# Patient Record
Sex: Male | Born: 1940
Health system: Southern US, Community
[De-identification: ages and names within clinical notes are randomized; demographics above are authoritative.]

## PROBLEM LIST (undated history)

## (undated) DIAGNOSIS — I5042 Chronic combined systolic (congestive) and diastolic (congestive) heart failure: Secondary | ICD-10-CM

## (undated) DIAGNOSIS — Z9889 Other specified postprocedural states: Secondary | ICD-10-CM

## (undated) DIAGNOSIS — I471 Supraventricular tachycardia, unspecified: Secondary | ICD-10-CM

## (undated) DIAGNOSIS — I493 Ventricular premature depolarization: Secondary | ICD-10-CM

## (undated) DIAGNOSIS — I428 Other cardiomyopathies: Secondary | ICD-10-CM

## (undated) DIAGNOSIS — I739 Peripheral vascular disease, unspecified: Secondary | ICD-10-CM

## (undated) DIAGNOSIS — C801 Malignant (primary) neoplasm, unspecified: Secondary | ICD-10-CM

## (undated) DIAGNOSIS — I2510 Atherosclerotic heart disease of native coronary artery without angina pectoris: Secondary | ICD-10-CM

## (undated) DIAGNOSIS — N529 Male erectile dysfunction, unspecified: Secondary | ICD-10-CM

## (undated) DIAGNOSIS — Z87442 Personal history of urinary calculi: Secondary | ICD-10-CM

## (undated) DIAGNOSIS — T8859XA Other complications of anesthesia, initial encounter: Secondary | ICD-10-CM

## (undated) DIAGNOSIS — I10 Essential (primary) hypertension: Secondary | ICD-10-CM

## (undated) DIAGNOSIS — J310 Chronic rhinitis: Secondary | ICD-10-CM

## (undated) DIAGNOSIS — M1990 Unspecified osteoarthritis, unspecified site: Secondary | ICD-10-CM

## (undated) DIAGNOSIS — H269 Unspecified cataract: Secondary | ICD-10-CM

## (undated) DIAGNOSIS — Z89511 Acquired absence of right leg below knee: Secondary | ICD-10-CM

## (undated) DIAGNOSIS — R112 Nausea with vomiting, unspecified: Secondary | ICD-10-CM

## (undated) DIAGNOSIS — I00 Rheumatic fever without heart involvement: Secondary | ICD-10-CM

## (undated) DIAGNOSIS — T4145XA Adverse effect of unspecified anesthetic, initial encounter: Secondary | ICD-10-CM

## (undated) HISTORY — DX: Chronic rhinitis: J31.0

## (undated) HISTORY — PX: EYE SURGERY: SHX253

## (undated) HISTORY — DX: Rheumatic fever without heart involvement: I00

## (undated) HISTORY — DX: Unspecified osteoarthritis, unspecified site: M19.90

## (undated) HISTORY — DX: Atherosclerotic heart disease of native coronary artery without angina pectoris: I25.10

## (undated) HISTORY — DX: Male erectile dysfunction, unspecified: N52.9

## (undated) HISTORY — PX: OTHER SURGICAL HISTORY: SHX169

## (undated) HISTORY — DX: Essential (primary) hypertension: I10

---
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
## 1981-05-28 HISTORY — PX: CHOLECYSTECTOMY: SHX55

## 1999-09-04 ENCOUNTER — Other Ambulatory Visit: Admission: RE | Admit: 1999-09-04 | Discharge: 1999-09-04 | Payer: Self-pay | Admitting: Gastroenterology

## 2004-08-25 ENCOUNTER — Ambulatory Visit: Payer: Self-pay | Admitting: Family Medicine

## 2004-09-15 ENCOUNTER — Ambulatory Visit: Payer: Self-pay | Admitting: Family Medicine

## 2004-09-19 ENCOUNTER — Encounter: Admission: RE | Admit: 2004-09-19 | Discharge: 2004-09-19 | Payer: Self-pay | Admitting: Family Medicine

## 2004-09-19 ENCOUNTER — Ambulatory Visit: Payer: Self-pay | Admitting: Family Medicine

## 2004-09-22 ENCOUNTER — Ambulatory Visit: Payer: Self-pay | Admitting: Gastroenterology

## 2004-10-30 ENCOUNTER — Encounter (INDEPENDENT_AMBULATORY_CARE_PROVIDER_SITE_OTHER): Payer: Self-pay | Admitting: *Deleted

## 2004-10-30 ENCOUNTER — Ambulatory Visit: Payer: Self-pay | Admitting: Gastroenterology

## 2005-09-07 ENCOUNTER — Ambulatory Visit: Payer: Self-pay | Admitting: Family Medicine

## 2005-09-18 ENCOUNTER — Ambulatory Visit: Payer: Self-pay | Admitting: Family Medicine

## 2005-11-15 ENCOUNTER — Ambulatory Visit: Payer: Self-pay | Admitting: Family Medicine

## 2005-12-21 ENCOUNTER — Ambulatory Visit: Payer: Self-pay | Admitting: Family Medicine

## 2006-05-22 ENCOUNTER — Ambulatory Visit: Payer: Self-pay | Admitting: Family Medicine

## 2006-05-23 ENCOUNTER — Encounter: Admission: RE | Admit: 2006-05-23 | Discharge: 2006-05-23 | Payer: Self-pay | Admitting: Family Medicine

## 2006-06-15 ENCOUNTER — Inpatient Hospital Stay (HOSPITAL_COMMUNITY): Admission: EM | Admit: 2006-06-15 | Discharge: 2006-06-18 | Payer: Self-pay | Admitting: Emergency Medicine

## 2006-06-15 ENCOUNTER — Ambulatory Visit: Payer: Self-pay | Admitting: *Deleted

## 2006-06-18 ENCOUNTER — Ambulatory Visit: Payer: Self-pay | Admitting: Internal Medicine

## 2006-06-24 ENCOUNTER — Ambulatory Visit: Payer: Self-pay | Admitting: Family Medicine

## 2006-07-05 ENCOUNTER — Ambulatory Visit: Payer: Self-pay | Admitting: Family Medicine

## 2006-07-17 ENCOUNTER — Ambulatory Visit: Payer: Self-pay | Admitting: Family Medicine

## 2006-09-24 ENCOUNTER — Ambulatory Visit: Payer: Self-pay | Admitting: Family Medicine

## 2006-09-24 LAB — CONVERTED CEMR LAB
ALT: 25 units/L (ref 0–40)
AST: 26 units/L (ref 0–37)
Albumin: 4 g/dL (ref 3.5–5.2)
Alkaline Phosphatase: 59 units/L (ref 39–117)
BUN: 14 mg/dL (ref 6–23)
Basophils Absolute: 0 10*3/uL (ref 0.0–0.1)
Basophils Relative: 0.1 % (ref 0.0–1.0)
Bilirubin, Direct: 0.2 mg/dL (ref 0.0–0.3)
CO2: 32 meq/L (ref 19–32)
Calcium: 9.4 mg/dL (ref 8.4–10.5)
Chloride: 101 meq/L (ref 96–112)
Cholesterol: 152 mg/dL (ref 0–200)
Creatinine, Ser: 0.6 mg/dL (ref 0.4–1.5)
Eosinophils Absolute: 0.1 10*3/uL (ref 0.0–0.6)
Eosinophils Relative: 1.1 % (ref 0.0–5.0)
GFR calc Af Amer: 173 mL/min
GFR calc non Af Amer: 143 mL/min
Glucose, Bld: 136 mg/dL — ABNORMAL HIGH (ref 70–99)
HCT: 46 % (ref 39.0–52.0)
HDL: 38.9 mg/dL — ABNORMAL LOW (ref >39.0)
Hemoglobin: 15.9 g/dL (ref 13.0–17.0)
Hgb A1c MFr Bld: 6.5 % — ABNORMAL HIGH (ref 4.6–6.0)
LDL Cholesterol: 80 mg/dL (ref 0–99)
Lymphocytes Relative: 20.8 % (ref 12.0–46.0)
MCHC: 34.6 g/dL (ref 30.0–36.0)
MCV: 95.9 fL (ref 78.0–100.0)
Monocytes Absolute: 0.6 10*3/uL (ref 0.2–0.7)
Monocytes Relative: 7.1 % (ref 3.0–11.0)
Neutro Abs: 5.6 10*3/uL (ref 1.4–7.7)
Neutrophils Relative %: 70.9 % (ref 43.0–77.0)
PSA: 0.28 ng/mL (ref 0.10–4.00)
Platelets: 238 10*3/uL (ref 150–400)
Potassium: 3.7 meq/L (ref 3.5–5.1)
RBC: 4.8 M/uL (ref 4.22–5.81)
RDW: 12.4 % (ref 11.5–14.6)
Sodium: 144 meq/L (ref 135–145)
TSH: 1.75 microintl units/mL (ref 0.35–5.50)
Total Bilirubin: 0.9 mg/dL (ref 0.3–1.2)
Total CHOL/HDL Ratio: 3.9
Total Protein: 7.3 g/dL (ref 6.0–8.3)
Triglycerides: 168 mg/dL — ABNORMAL HIGH (ref 0–149)
VLDL: 34 mg/dL (ref 0–40)
WBC: 7.9 10*3/uL (ref 4.5–10.5)

## 2007-03-24 DIAGNOSIS — E1159 Type 2 diabetes mellitus with other circulatory complications: Secondary | ICD-10-CM | POA: Insufficient documentation

## 2007-03-24 DIAGNOSIS — I10 Essential (primary) hypertension: Secondary | ICD-10-CM | POA: Insufficient documentation

## 2007-03-24 DIAGNOSIS — J309 Allergic rhinitis, unspecified: Secondary | ICD-10-CM | POA: Insufficient documentation

## 2007-08-07 DIAGNOSIS — M779 Enthesopathy, unspecified: Secondary | ICD-10-CM | POA: Insufficient documentation

## 2007-08-08 ENCOUNTER — Ambulatory Visit: Payer: Self-pay | Admitting: Family Medicine

## 2007-08-08 DIAGNOSIS — M1990 Unspecified osteoarthritis, unspecified site: Secondary | ICD-10-CM | POA: Insufficient documentation

## 2007-09-30 ENCOUNTER — Ambulatory Visit: Payer: Self-pay | Admitting: Family Medicine

## 2007-09-30 DIAGNOSIS — F528 Other sexual dysfunction not due to a substance or known physiological condition: Secondary | ICD-10-CM | POA: Insufficient documentation

## 2007-10-09 ENCOUNTER — Telehealth (INDEPENDENT_AMBULATORY_CARE_PROVIDER_SITE_OTHER): Payer: Self-pay | Admitting: *Deleted

## 2007-11-19 DIAGNOSIS — L03119 Cellulitis of unspecified part of limb: Secondary | ICD-10-CM

## 2007-11-19 DIAGNOSIS — L02419 Cutaneous abscess of limb, unspecified: Secondary | ICD-10-CM | POA: Insufficient documentation

## 2007-11-20 ENCOUNTER — Ambulatory Visit: Payer: Self-pay | Admitting: Family Medicine

## 2007-11-21 ENCOUNTER — Ambulatory Visit: Payer: Self-pay | Admitting: Family Medicine

## 2007-11-24 ENCOUNTER — Ambulatory Visit: Payer: Self-pay | Admitting: Family Medicine

## 2008-05-31 ENCOUNTER — Ambulatory Visit: Payer: Self-pay | Admitting: Family Medicine

## 2008-05-31 DIAGNOSIS — D1739 Benign lipomatous neoplasm of skin and subcutaneous tissue of other sites: Secondary | ICD-10-CM | POA: Insufficient documentation

## 2008-05-31 DIAGNOSIS — L851 Acquired keratosis [keratoderma] palmaris et plantaris: Secondary | ICD-10-CM | POA: Insufficient documentation

## 2008-07-26 ENCOUNTER — Ambulatory Visit: Payer: Self-pay | Admitting: Family Medicine

## 2008-07-28 ENCOUNTER — Ambulatory Visit: Payer: Self-pay | Admitting: Family Medicine

## 2008-09-13 DIAGNOSIS — S60229A Contusion of unspecified hand, initial encounter: Secondary | ICD-10-CM | POA: Insufficient documentation

## 2008-09-14 ENCOUNTER — Ambulatory Visit: Payer: Self-pay | Admitting: Family Medicine

## 2008-10-05 ENCOUNTER — Ambulatory Visit: Payer: Self-pay | Admitting: Family Medicine

## 2008-10-13 ENCOUNTER — Encounter: Payer: Self-pay | Admitting: Family Medicine

## 2008-10-13 ENCOUNTER — Ambulatory Visit: Payer: Self-pay | Admitting: Family Medicine

## 2008-10-18 DIAGNOSIS — C44309 Unspecified malignant neoplasm of skin of other parts of face: Secondary | ICD-10-CM | POA: Insufficient documentation

## 2008-10-18 DIAGNOSIS — C44300 Unspecified malignant neoplasm of skin of unspecified part of face: Secondary | ICD-10-CM | POA: Insufficient documentation

## 2008-12-19 ENCOUNTER — Inpatient Hospital Stay (HOSPITAL_COMMUNITY): Admission: EM | Admit: 2008-12-19 | Discharge: 2008-12-24 | Payer: Self-pay | Admitting: Emergency Medicine

## 2008-12-19 ENCOUNTER — Ambulatory Visit: Payer: Self-pay | Admitting: Internal Medicine

## 2008-12-23 ENCOUNTER — Ambulatory Visit: Payer: Self-pay | Admitting: Surgery

## 2008-12-28 ENCOUNTER — Ambulatory Visit: Payer: Self-pay | Admitting: Family Medicine

## 2009-01-10 ENCOUNTER — Ambulatory Visit: Payer: Self-pay | Admitting: Surgery

## 2009-09-02 ENCOUNTER — Encounter (INDEPENDENT_AMBULATORY_CARE_PROVIDER_SITE_OTHER): Payer: Self-pay | Admitting: *Deleted

## 2009-10-11 ENCOUNTER — Ambulatory Visit: Payer: Self-pay | Admitting: Family Medicine

## 2009-10-25 ENCOUNTER — Telehealth: Payer: Self-pay | Admitting: *Deleted

## 2009-10-31 ENCOUNTER — Telehealth: Payer: Self-pay | Admitting: Family Medicine

## 2009-12-23 ENCOUNTER — Telehealth: Payer: Self-pay | Admitting: Family Medicine

## 2009-12-27 ENCOUNTER — Telehealth: Payer: Self-pay | Admitting: Family Medicine

## 2010-02-28 ENCOUNTER — Ambulatory Visit: Payer: Self-pay | Admitting: Family Medicine

## 2010-03-14 ENCOUNTER — Ambulatory Visit: Payer: Self-pay | Admitting: Family Medicine

## 2010-05-08 ENCOUNTER — Telehealth: Payer: Self-pay | Admitting: Family Medicine

## 2010-06-02 ENCOUNTER — Telehealth: Payer: Self-pay | Admitting: Family Medicine

## 2010-06-06 ENCOUNTER — Ambulatory Visit
Admission: RE | Admit: 2010-06-06 | Discharge: 2010-06-06 | Payer: Self-pay | Source: Home / Self Care | Attending: Family Medicine | Admitting: Family Medicine

## 2010-06-06 DIAGNOSIS — J45901 Unspecified asthma with (acute) exacerbation: Secondary | ICD-10-CM | POA: Insufficient documentation

## 2010-06-25 LAB — CONVERTED CEMR LAB
ALT: 28 units/L (ref 0–53)
ALT: 30 units/L (ref 0–53)
ALT: 30 units/L (ref 0–53)
AST: 30 units/L (ref 0–37)
AST: 31 units/L (ref 0–37)
AST: 31 units/L (ref 0–37)
Albumin: 3.8 g/dL (ref 3.5–5.2)
Albumin: 4 g/dL (ref 3.5–5.2)
Albumin: 4.1 g/dL (ref 3.5–5.2)
Alkaline Phosphatase: 46 units/L (ref 39–117)
Alkaline Phosphatase: 46 units/L (ref 39–117)
Alkaline Phosphatase: 51 units/L (ref 39–117)
BUN: 14 mg/dL (ref 6–23)
BUN: 14 mg/dL (ref 6–23)
BUN: 15 mg/dL (ref 6–23)
Basophils Absolute: 0 10*3/uL (ref 0.0–0.1)
Basophils Absolute: 0.1 10*3/uL (ref 0.0–0.1)
Basophils Absolute: 0.1 10*3/uL (ref 0.0–0.1)
Basophils Relative: 0.5 % (ref 0.0–3.0)
Basophils Relative: 1.2 % (ref 0.0–3.0)
Basophils Relative: 1.5 % — ABNORMAL HIGH (ref 0.0–1.0)
Bilirubin Urine: NEGATIVE
Bilirubin, Direct: 0.1 mg/dL (ref 0.0–0.3)
Bilirubin, Direct: 0.2 mg/dL (ref 0.0–0.3)
Bilirubin, Direct: 0.3 mg/dL (ref 0.0–0.3)
Blood in Urine, dipstick: NEGATIVE
Blood in Urine, dipstick: NEGATIVE
CO2: 31 meq/L (ref 19–32)
CO2: 31 meq/L (ref 19–32)
CO2: 32 meq/L (ref 19–32)
Calcium: 9 mg/dL (ref 8.4–10.5)
Calcium: 9.2 mg/dL (ref 8.4–10.5)
Calcium: 9.4 mg/dL (ref 8.4–10.5)
Chloride: 100 meq/L (ref 96–112)
Chloride: 103 meq/L (ref 96–112)
Chloride: 97 meq/L (ref 96–112)
Cholesterol: 133 mg/dL (ref 0–200)
Cholesterol: 141 mg/dL (ref 0–200)
Cholesterol: 143 mg/dL (ref 0–200)
Creatinine, Ser: 0.6 mg/dL (ref 0.4–1.5)
Creatinine, Ser: 0.7 mg/dL (ref 0.4–1.5)
Creatinine, Ser: 0.8 mg/dL (ref 0.4–1.5)
Direct LDL: 65.6 mg/dL
Direct LDL: 69.7 mg/dL
Eosinophils Absolute: 0.1 10*3/uL (ref 0.0–0.7)
Eosinophils Absolute: 0.1 10*3/uL (ref 0.0–0.7)
Eosinophils Absolute: 0.2 10*3/uL (ref 0.0–0.7)
Eosinophils Relative: 2.4 % (ref 0.0–5.0)
Eosinophils Relative: 2.5 % (ref 0.0–5.0)
Eosinophils Relative: 2.5 % (ref 0.0–5.0)
GFR calc Af Amer: 124 mL/min
GFR calc non Af Amer: 102 mL/min
GFR calc non Af Amer: 129.09 mL/min (ref >60)
GFR calc non Af Amer: 142 mL/min (ref >60)
Glucose, Bld: 128 mg/dL — ABNORMAL HIGH (ref 70–99)
Glucose, Bld: 136 mg/dL — ABNORMAL HIGH (ref 70–99)
Glucose, Bld: 141 mg/dL — ABNORMAL HIGH (ref 70–99)
Glucose, Urine, Semiquant: NEGATIVE
Glucose, Urine, Semiquant: NEGATIVE
HCT: 42.4 % (ref 39.0–52.0)
HCT: 43.2 % (ref 39.0–52.0)
HCT: 43.7 % (ref 39.0–52.0)
HDL: 33 mg/dL — ABNORMAL LOW (ref >39.00)
HDL: 34.9 mg/dL — ABNORMAL LOW (ref >39.0)
HDL: 38.3 mg/dL — ABNORMAL LOW (ref >39.00)
Hemoglobin: 14.7 g/dL (ref 13.0–17.0)
Hemoglobin: 15 g/dL (ref 13.0–17.0)
Hemoglobin: 15.3 g/dL (ref 13.0–17.0)
Hgb A1c MFr Bld: 6.7 % — ABNORMAL HIGH (ref 4.6–6.5)
Ketones, urine, test strip: NEGATIVE
LDL Cholesterol: 80 mg/dL (ref 0–99)
Lymphocytes Relative: 24.9 % (ref 12.0–46.0)
Lymphocytes Relative: 25.5 % (ref 12.0–46.0)
Lymphocytes Relative: 26.1 % (ref 12.0–46.0)
Lymphs Abs: 1.4 10*3/uL (ref 0.7–4.0)
Lymphs Abs: 1.8 10*3/uL (ref 0.7–4.0)
MCHC: 34.3 g/dL (ref 30.0–36.0)
MCHC: 34.5 g/dL (ref 30.0–36.0)
MCHC: 35.3 g/dL (ref 30.0–36.0)
MCV: 96.2 fL (ref 78.0–100.0)
MCV: 96.9 fL (ref 78.0–100.0)
MCV: 99.6 fL (ref 78.0–100.0)
Monocytes Absolute: 0.5 10*3/uL (ref 0.1–1.0)
Monocytes Absolute: 0.7 10*3/uL (ref 0.1–1.0)
Monocytes Absolute: 0.7 10*3/uL (ref 0.1–1.0)
Monocytes Relative: 11.5 % (ref 3.0–12.0)
Monocytes Relative: 8.1 % (ref 3.0–12.0)
Monocytes Relative: 9.4 % (ref 3.0–12.0)
Neutro Abs: 3.7 10*3/uL (ref 1.4–7.7)
Neutro Abs: 3.7 10*3/uL (ref 1.4–7.7)
Neutro Abs: 4.3 10*3/uL (ref 1.4–7.7)
Neutrophils Relative %: 58.5 % (ref 43.0–77.0)
Neutrophils Relative %: 61.4 % (ref 43.0–77.0)
Neutrophils Relative %: 64 % (ref 43.0–77.0)
Nitrite: NEGATIVE
Nitrite: NEGATIVE
PSA: 0.22 ng/mL (ref 0.10–4.00)
PSA: 0.33 ng/mL (ref 0.10–4.00)
Platelets: 184 10*3/uL (ref 150.0–400.0)
Platelets: 193 10*3/uL (ref 150.0–400.0)
Platelets: 203 10*3/uL (ref 150–400)
Potassium: 3.4 meq/L — ABNORMAL LOW (ref 3.5–5.1)
Potassium: 3.5 meq/L (ref 3.5–5.1)
Potassium: 3.6 meq/L (ref 3.5–5.1)
RBC: 4.38 M/uL (ref 4.22–5.81)
RBC: 4.39 M/uL (ref 4.22–5.81)
RBC: 4.49 M/uL (ref 4.22–5.81)
RDW: 12 % (ref 11.5–14.6)
RDW: 12.4 % (ref 11.5–14.6)
RDW: 12.7 % (ref 11.5–14.6)
Sodium: 139 meq/L (ref 135–145)
Sodium: 141 meq/L (ref 135–145)
Sodium: 142 meq/L (ref 135–145)
Specific Gravity, Urine: 1.02
Specific Gravity, Urine: 1.02
TSH: 1.69 microintl units/mL (ref 0.35–5.50)
TSH: 1.77 microintl units/mL (ref 0.35–5.50)
TSH: 2.08 microintl units/mL (ref 0.35–5.50)
Total Bilirubin: 0.9 mg/dL (ref 0.3–1.2)
Total Bilirubin: 0.9 mg/dL (ref 0.3–1.2)
Total Bilirubin: 1.3 mg/dL — ABNORMAL HIGH (ref 0.3–1.2)
Total CHOL/HDL Ratio: 4
Total CHOL/HDL Ratio: 4
Total CHOL/HDL Ratio: 4.1
Total Protein: 6.9 g/dL (ref 6.0–8.3)
Total Protein: 7 g/dL (ref 6.0–8.3)
Total Protein: 7.1 g/dL (ref 6.0–8.3)
Triglycerides: 143 mg/dL (ref 0–149)
Triglycerides: 213 mg/dL — ABNORMAL HIGH (ref 0.0–149.0)
Triglycerides: 256 mg/dL — ABNORMAL HIGH (ref 0.0–149.0)
Urobilinogen, UA: 1
Urobilinogen, UA: 4
VLDL: 29 mg/dL (ref 0–40)
VLDL: 42.6 mg/dL — ABNORMAL HIGH (ref 0.0–40.0)
VLDL: 51.2 mg/dL — ABNORMAL HIGH (ref 0.0–40.0)
WBC Urine, dipstick: NEGATIVE
WBC Urine, dipstick: NEGATIVE
WBC: 5.7 10*3/uL (ref 4.5–10.5)
WBC: 6.2 10*3/uL (ref 4.5–10.5)
WBC: 7 10*3/uL (ref 4.5–10.5)
pH: 7
pH: 7

## 2010-06-29 NOTE — Assessment & Plan Note (Signed)
Summary: infected foot lesion/dm   Vital Signs:  Patient Profile:   70 Years Old Male Height:     69.5 inches Weight:      238 pounds Temp:     97.8 degrees F oral BP sitting:   120 / 84  (left arm) Cuff size:   regular  Vitals Entered By: Kern Reap CMA (July 26, 2008 3:04 PM)                 Chief Complaint:  left foot pain .  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  Static callus on the left foot because the arches collapse.  This morning he woke up and noticed a blood blister and redness around the blister.  No history of trauma    Current Allergies: No known allergies   Past Medical History:    Reviewed history from 08/08/2007 and no changes required:       ED       Allergic rhinitis       Hypertension       Osteoarthritis   Social History:    Reviewed history from 03/24/2007 and no changes required:       Retired       Married       Former Smoker       Alcohol use-yes       Regular exercise-yes    Review of Systems      See HPI   Physical Exam  General:     Well-developed,well-nourished,in no acute distress; alert,appropriate and cooperative throughout examination Skin:     callus formation on the ventral surface, left foot, with a 2-inch blood blister with surrounding erythema    Impression & Recommendations:  Problem # 1:  CELLULITIS, LEG, LEFT (ICD-682.6) Assessment: Deteriorated  His updated medication list for this problem includes:    Keflex 500 Mg Caps (Cephalexin) .Marland Kitchen... 2 by mouth two times a day   Complete Medication List: 1)  Atenolol-chlorthalidone 50-25 Mg Tabs (Atenolol-chlorthalidone) .... Take 1 tablet by mouth every morning 2)  Adult Aspirin Low Strength 81 Mg Chew (Aspirin) 3)  Viagra 100 Mg Tabs (Sildenafil citrate) .... Take as directed 4)  Potassium Chloride Crys Cr 20 Meq Tbcr (Potassium chloride crys cr)  .... Three times a day 5)  Betamethasone Dipropionate 0.05 % Crea (Betamethasone dipropionate) .... Apply at bedtime 6)  Keflex 500 Mg Caps (Cephalexin) .... 2 by mouth two times a day   Patient Instructions: 1)  elevation now heating pad, low heat.  Return Wednesday morning, age 58 for follow-up.  Also begin Keflex 500-mg tabs two tabs b.i.d.   Prescriptions: KEFLEX 500 MG CAPS (CEPHALEXIN) 2 by mouth two times a day  #50 x 1   Entered by:   Lynann Beaver CMA   Authorized by:   Roderick Pee MD   Signed by:   Lynann Beaver CMA on 07/26/2008   Method used:   Electronically to        [id number]       [address]       [place], [place]  [address]       Ph: [phone] or [phone]       Fax: [phone]   RxID:   [id number] KEFLEX 500 MG CAPS (CEPHALEXIN) 2 by mouth two times a day  #50 x 1   Entered  and Authorized by:   Roderick Pee MD   Signed by:   Roderick Pee MD on 07/26/2008   Method used:   Electronically to        [id number] (retail)       [address]       [place], [place]  [address]       Ph: [phone] or [phone]       Fax: [phone]   RxID:   [id number]
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
## 2010-06-29 NOTE — Progress Notes (Signed)
Summary: potassium confusion  Phone Note Call from Patient Call back at Home Phone [phone]   Caller: wife,juanita Summary of Call: Confusion about potassium RX.  Was taking per day and Dr. Karie Schwalbe was going to give liquid because of swallowing difficulty.  Out of med.  Called in #12 until Dr. Karie Schwalbe can straighten this out.   Send correct Rx to [facility]. Initial call taken by: Rudy Jew, RN,  December 23, 2009 9:57 AM    New/Updated Medications: KLOR-CON M20 20 MEQ CR-TABS (POTASSIUM CHLORIDE CRYS CR) take three tabs once daily Prescriptions: KLOR-CON M20 20 MEQ CR-TABS (POTASSIUM CHLORIDE CRYS CR) take three tabs once daily  #12 x 0   Entered by:   Kern Reap CMA (AAMA)   Authorized by:   Roderick Pee MD   Signed by:   Kern Reap CMA (AAMA) on 12/23/2009   Method used:   Electronically to        [id number] ([facility]       [address]       [place], [place]  [address]       Ph: [phone] or [phone]       Fax: [phone]   RxID:   [id number]

## 2010-06-29 NOTE — Assessment & Plan Note (Signed)
Summary: INJECTION/RCD  Nurse Visit    Prior Medications: ATENOLOL-CHLORTHALIDONE 50-25 MG  TABS (ATENOLOL-CHLORTHALIDONE) Take 1 tablet by mouth every morning ADULT ASPIRIN LOW STRENGTH 81 MG  CHEW (ASPIRIN)  VIAGRA 100 MG  TABS (SILDENAFIL CITRATE) take as directed POTASSIUM CHLORIDE CRYS CR 20 MEQ  TBCR (POTASSIUM CHLORIDE CRYS CR) three times a day Current Allergies: No known allergies     Medication Administration  Injection # 1:    Medication: Rocephin  250mg     Diagnosis: CELLULITIS, LEG, LEFT (ICD-682.6)    Route: IM    Site: LUOQ gluteus    Exp Date: 03/28/2010    Lot #: [id number]    Mfr: sandoz    Comments: x 4    Patient tolerated injection without complications    Given by: Kern Reap CMA (November 21, 2007 4:30 PM)  Orders Added: 1)  Admin of Therapeutic Inj  intramuscular or subcutaneous [96372] 2)  Rocephin  250mg  Malleolus.Diss    ]

## 2010-06-29 NOTE — Progress Notes (Signed)
Summary: refill atenolol-chlorthal.  Phone Note Refill Request Message from:  Fax from Pharmacy on Oct 25, 2009 12:54 PM  Refills Requested: Medication #1:  ATENOLOL-CHLORTHALIDONE 50-25 MG  TABS Take 1 tablet by mouth every morning   Last Ref[facility]: [phone]   Method Requested: Fax to Local Pharmacy Initial call taken by: Duard Brady LPN,  Oct 25, 2009 12:55 PM  Follow-up for Phone Call        [facility] - disreguard request. KIK Follow-up by: Duard Brady LPN,  Oct 25, 2009 1:48 PM

## 2010-06-29 NOTE — Assessment & Plan Note (Signed)
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  His hypertension is treated with Tenoretic 50 -- 20 51 daily BP 124/80.  He takes 60  mEq potassium supplements daily.we will check a potassium level today.  The eczema is treated with betamethasone cream b.i.d. p.r.n.  Gastroenteritis is getting progressively worse.  It involves his hips and knees and ankles.  Is taking no medication for it.  He has a nonhealing lesion on his right ear x 1 year.  Would like it evaluated.  Just routine eye care.  Dental care, colonoscopy, and GI 2004, normal.  Tetanus booster 2008, pneumonia vaccine 2007, seasonal flu shot 2009.  Patient was inquired about the shingles.  Vaccination  Allergies (verified): No Known Drug Allergies  Past History:  Past medical, surgical, family and social histories (including risk factors) reviewed, and no changes noted (except as noted below).  Past Medical History:    Reviewed history from 08/08/2007 and no changes required:    ED    Allergic rhinitis    Hypertension    Osteoarthritis  Past Surgical History:    Reviewed history from 03/24/2007 and no changes required:    Colonoscopy    Inguinal herniorrhaphy  Family History:    Reviewed history from 03/24/2007 and no changes required:       Family History of CAD Male 1st degree relative <50       Family History Hypertension       Family History of Stroke F 1st degree relative <60       Family  History of Stroke M 1st degree relative <50       Family History Weight disorder       Family History of Cardiovascular disorder       Family History of Respiratory disease  Social History:    Reviewed history from 03/24/2007 and no changes required:       Retired       Married       Former Smoker       Alcohol use-yes       Regular exercise-yes  Review of Systems      See HPI  Physical Exam  General:  Well-developed,well-nourished,in no acute distress; alert,appropriate and cooperative throughout examination Head:  Normocephalic and atraumatic without obvious abnormalities. No apparent alopecia or balding. Eyes:  No corneal or conjunctival inflammation noted. EOMI. Perrla. Funduscopic exam benign, without hemorrhages, exudates or papilledema. Vision grossly normal. Ears:  External ear exam shows no significant lesions or deformities.  Otoscopic examination reveals clear canals, tympanic membranes are intact bilaterally without bulging, retraction, inflammation or discharge. Hearing is grossly normal bilaterally. Nose:  External nasal examination shows no deformity or inflammation. Nasal mucosa are pink and moist without lesions or exudates. Mouth:  Oral mucosa and oropharynx without lesions or exudates.  Teeth in good repair. Neck:  No deformities, masses, or tenderness noted.  Chest Wall:  No deformities, masses, tenderness or gynecomastia noted. Breasts:  No masses or gynecomastia noted Lungs:  Normal respiratory effort, chest expands symmetrically. Lungs are clear to auscultation, no crackles or wheezes. Heart:  Normal rate and regular rhythm. S1 and S2 normal without gallop, murmur, click, rub or other extra sounds. Abdomen:  Bowel sounds positive,abdomen soft and non-tender without masses, organomegaly or hernias noted. Rectal:  No external abnormalities noted. Normal sphincter tone. No rectal masses or tenderness. Genitalia:  Testes bilaterally descended without nodularity,  tenderness or masses. No scrotal masses or lesions. No penis lesions or urethral discharge. Prostate:  Prostate gland firm and smooth, no enlargement, nodularity, tenderness, mass, asymmetry or induration. Msk:  No deformity or scoliosis noted of thoracic or lumbar spine.   Pulses:  R and L carotid,radial,femoral,dorsalis pedis and posterior tibial pulses are full and equal bilaterally Extremities:  No clubbing, cyanosis, edema, or deformity noted with normal full range of motion of all joints.   Neurologic:  No cranial nerve deficits noted. Station and gait are normal. Plantar reflexes are down-going bilaterally. DTRs are symmetrical throughout. Sensory, motor and coordinative functions appear intact. Skin:  total skin exam normal except for scab in the right year.  Advise return ASAP for removal Cervical Nodes:  No lymphadenopathy noted Axillary Nodes:  No palpable lymphadenopathy Inguinal Nodes:  No significant adenopathy Psych:  Cognition and judgment appear intact. Alert and cooperative with normal attention span and concentration. No apparent delusions, illusions, hallucinations   Impression & Recommendations:  Problem # 1:  DRY SKIN (ICD-701.1) Assessment Improved  Orders: Venipuncture (01027) TLB-Lipid Panel (80061-LIPID) TLB-BMP (Basic Metabolic Panel-BMET) (80048-METABOL) TLB-CBC Platelet - w/Differential (85025-CBCD) TLB-Hepatic/Liver Function Pnl (80076-HEPATIC) TLB-TSH (Thyroid Stimulating Hormone) (25366-YQI) Prescription Created Electronically [phone]) UA Dipstick w/o Micro (automated)  (81003)  Problem # 2:  OSTEOARTHRITIS (ICD-715.90) Assessment: Deteriorated  His updated medication list for this problem includes:    Adult Aspirin Low Strength 81 Mg Chew (Aspirin)  Orders: Venipuncture (59563) TLB-Lipid Panel (80061-LIPID) TLB-BMP (Basic Metabolic Panel-BMET) (80048-METABOL) TLB-CBC Platelet - w/Differential (85025-CBCD) TLB-Hepatic/Liver Function Pnl  (80076-HEPATIC) TLB-TSH (Thyroid Stimulating Hormone) (87564-PPI) Prescription Created Electronically [phone]) UA Dipstick w/o Micro (automated)  (81003)  Problem # 3:  HYPERTENSION (ICD-401.9) Assessment: Improved  His updated medication list for this problem includes:    Atenolol-chlorthalidone 50-25 Mg Tabs (Atenolol-chlorthalidone) .Marland Kitchen... Take 1 tablet by mouth every morning  Orders: Venipuncture (41660) TLB-Lipid Panel (80061-LIPID) TLB-BMP (Basic Metabolic Panel-BMET) (80048-METABOL) TLB-CBC Platelet - w/Differential (85025-CBCD) TLB-Hepatic/Liver Function Pnl (80076-HEPATIC) TLB-TSH (Thyroid Stimulating Hormone) (63016-[facility]U) Prescription Created Electronically [phone]) UA Dipstick w/o Micro (automated)  (81003) EKG w/ Interpretation (93000)  Complete Medication List: 1)  Atenolol-chlorthalidone 50-25 Mg Tabs (Atenolol-chlorthalidone) .... Take 1 tablet by mouth every morning 2)  Adult Aspirin Low Strength 81 Mg Chew (Aspirin) 3)  Viagra 100 Mg Tabs (Sildenafil citrate) .... Take as directed 4)  Potassium Chloride Crys Cr 20 Meq Tbcr (Potassium chloride crys cr) .... Three times a day 5)  Betamethasone Dipropionate 0.05 % Crea (Betamethasone dipropionate) .... Apply at bedtime  Patient Instructions: 1)  take 600 mg of Motrin twice a day for your arthritis. 2)  Return next Wednesday afternoon for removal of the lesion on her left ear.  Hold the aspirin and Motrin for two days prior to the procedure Prescriptions: BETAMETHASONE DIPROPIONATE 0.05 % CREA (BETAMETHASONE DIPROPIONATE) apply at bedtime  #60 gr. x 3   Entered and Authorized by:   Roderick Pee MD   Signed by:   Tinnie Gens  Shawnie Dapper MD on 10/05/2008   Method used:   Electronically to        [facility]       [address]       [place], [place]  [address]       Ph: [phone] or [phone]       Fax: [phone]   RxID:   [id number] POTASSIUM CHLORIDE CRYS CR 20 MEQ  TBCR (POTASSIUM CHLORIDE CRYS  CR) three times a day  #300 x 4   Entered and Authorized by:   Roderick Pee MD   Signed by:   Roderick Pee MD on 10/05/2008   Method used:   Electronically to        [facility]       [address]       [place], [place]  [address]       Ph: [phone] or [phone]       Fax: [phone]   RxID:   [id number] ATENOLOL-CHLORTHALIDONE 50-25 MG  TABS (ATENOLOL-CHLORTHALIDONE) Take 1 tablet by mouth every morning  #100 x 4   Entered and Authorized by:   Roderick Pee MD   Signed by:   Roderick Pee MD on 10/05/2008   Method used:   Electronically to        [facility]       [address]       [place], [place]  [address]       Ph: [phone] or [phone]       Fax: [phone]   RxID:   [id number]        Preventive Care Screening  Colonoscopy:    Date:  09/30/2004    Next Due:  09/2009    Results:  Diverticulosis    Laboratory Results   Urine Tests    Routine Urinalysis   Color: yellow Appearance: Clear Glucose: negative   (Normal Range: Negative) Bilirubin: negative   (Normal Range: Negative) Ketone: 2+   (Normal Range: Negative) Spec. Gravity: 1.020   (Normal Range: 1.003-1.035) Blood: negative   (Normal Range: Negative) pH: 7.0   (Normal Range: 5.0-8.0) Protein: trace   (Normal Range: Negative) Urobilinogen: 4.0   (Normal Range: 0-1) Nitrite: negative   (Normal Range: Negative) Leukocyte Esterace: negative   (Normal Range: Negative)    Comments: Joanne Chars CMA  Oct 05, 2008 9:32 AM

## 2010-06-29 NOTE — Progress Notes (Signed)
Summary: dosage question?  Phone Note Call from Patient   Caller: Spouse Call For: Tyrone Pee MD Reason for Call: Refill Medication Summary of Call: Wife is asking exactly what type of Potassium pt should be taking and how much.  She has 25 meq per tablet that is to be dissolved in water. One daily. They want a liquid but are confused about dosage? Supposed to be on 60 meq daily.  [facility] Initial call taken by: Lynann Beaver CMA,  December 27, 2009 1:59 PM  Follow-up for Phone Call        Fleet Contras please call to straighten out the situation Follow-up by: Tyrone Pee MD,  December 27, 2009 4:25 PM  Additional Follow-up for Phone Call Additional follow up Details #1::        tried to call home number. no answer or machine to leave a message. Additional Follow-up by: [name]),  December 29, 2009 9:38 AM    Additional Follow-up for Phone Call Additional follow up Details #2::    spoke with pharmacy and 12 tabs filled until order for effer-k comes in. Follow-up by: [name]),  December 29, 2009 10:38 AM  New/Updated Medications: EFFER-K 20 MEQ TBEF (POTASSIUM BICARB-CITRIC ACID) one three times a day

## 2010-06-29 NOTE — Progress Notes (Signed)
Summary: Pt req new written script for Potassium 20mg  tid 90 day supply   Phone Note Call from Patient Call back at [facility] Phone [phone]   Caller: Patient Summary of Call: Pt says that he has been having [facility] will say its a 60day supply, then a 30day supply. Pt req new script for Potassium 20mg  for pick up at office, so pt can take script to different pharmacy.  Pls call when ready for pick up.  Initial call taken by: Lucy Antigua,  June 02, 2010 10:12 AM  Follow-up for Phone Call        pt is requesting 90 day supply total 270 pills Follow-up by: Heron Sabins,  June 02, 2010 12:21 PM    Prescriptions: KLOR-CON M20 20 MEQ CR-TABS (POTASSIUM CHLORIDE CRYS CR) take 3 tabs by mouth once daily  #270 x 3   Entered by:   Kern Reap CMA (AAMA)   Authorized by:   Roderick Pee MD   Signed by:   Kern Reap CMA (AAMA) on 06/02/2010   Method used:   Print then Give to Patient   RxID:   [id number]

## 2010-06-29 NOTE — Assessment & Plan Note (Signed)
Summary: 2 day rov/njr   Vitals Entered By: Kern Reap CMA (July 28, 2008 8:21 AM)                 Chief Complaint:  follow up foot.  History of Present Illness: [name] is a 70 year old male, who comes back today for reevaluation of cellulitis of his foot.  Is, noted.  He's been at bed rest for 48 hours elevation, heat and ABI.  He developed a cellulitis around a callus on the ventral portion of his foot.  We debrided all the dead tissue.  He's been soaking it and wrapping it as outlined    Prior Medication List:  ATENOLOL-CHLORTHALIDONE 50-25 MG  TABS (ATENOLOL-CHLORTHALIDONE) Take 1 tablet by mouth every morning ADULT ASPIRIN LOW STRENGTH 81 MG  CHEW (ASPIRIN)  VIAGRA 100 MG  TABS (SILDENAFIL CITRATE) take as directed POTASSIUM CHLORIDE CRYS CR 20 MEQ  TBCR (POTASSIUM CHLORIDE CRYS CR) three times a day BETAMETHASONE DIPROPIONATE 0.05 % CREA (BETAMETHASONE DIPROPIONATE) apply at bedtime KEFLEX 500 MG CAPS (CEPHALEXIN) 2 by mouth two times a day   Current Allergies: No known allergies    Social History:    Reviewed history from 03/24/2007 and no changes required:       Retired       Married       Former Smoker       Alcohol use-yes       Regular exercise-yes    Review of Systems      See HPI   Physical Exam  General:     Well-developed,well-nourished,in no acute distress; alert,appropriate and cooperative throughout examination Skin:     the erythema is markedly diminished.  Wound looks good    Impression & Recommendations:  Problem # 1:  CELLULITIS, LEG, LEFT (ICD-682.6) Assessment: Improved  His updated medication list for this problem includes:    Keflex 500 Mg Caps (Cephalexin) .Marland Kitchen... 2 by mouth two times a day   Complete Medication List: 1)  Atenolol-chlorthalidone 50-25 Mg Tabs (Atenolol-chlorthalidone) .... Take 1 tablet by mouth every morning 2)  Adult Aspirin Low Strength 81 Mg Chew (Aspirin) 3)  Viagra 100 Mg Tabs (Sildenafil citrate)  .... Take as directed 4)  Potassium Chloride Crys Cr 20 Meq Tbcr (Potassium chloride crys cr) .... Three times a day 5)  Betamethasone Dipropionate 0.05 % Crea (Betamethasone dipropionate) .... Apply at bedtime 6)  Keflex 500 Mg Caps (Cephalexin) .... 2 by mouth two times a day   Patient Instructions: 1)  continued in the biopsy.  Call the podiatrist to get a consult ASAP

## 2010-06-29 NOTE — Assessment & Plan Note (Signed)
Summary: chest congestion/cjr   Vital Signs:  Patient profile:   70 year old male Weight:      248 pounds Temp:     98.0 degrees F oral BP sitting:   130 / 80  (left arm) Cuff size:   regular  Vitals Entered By: [name]) (June 06, 2010 10:55 AM) CC: chest congestion   CC:  chest congestion.  History of Present Illness: [name] is a 70 year old, married male, nonsmoker, who comes in today for evaluation of cough x 3 months.  We saw him on October the fourth with a viral syndrome.  That triggered some asthma.  We started him on a short course of prednisone, which helped, but did not completely resolve.  The coughing.  It's been mild studies not been back but now it's persistent he would like some medication.  He said no fever, sputum production, hemoptysis, weight loss, etc.  Allergies: 1)  ! Penicillin  Past History:  Past medical, surgical, family and social histories (including risk factors) reviewed for relevance to current acute and chronic problems.  Past Medical History: Reviewed history from 08/08/2007 and no changes required. ED Allergic rhinitis Hypertension Osteoarthritis  Past Surgical History: Reviewed history from 03/24/2007 and no changes required. Colonoscopy Inguinal herniorrhaphy  Family History: Reviewed history from 03/24/2007 and no changes required. Family History of CAD Male 1st degree relative <50 Family History Hypertension Family History of Stroke F 1st degree relative <60 Family History of Stroke M 1st degree relative <50 Family History Weight disorder Family History of Cardiovascular disorder Family History of Respiratory disease  Social History: Reviewed history from 03/24/2007 and no changes required. Retired Married Former Smoker Alcohol use-yes Regular exercise-yes  Review of Systems      See HPI  Physical Exam  General:  Well-developed,well-nourished,in no acute distress; alert,appropriate and  cooperative throughout examination Head:  Normocephalic and atraumatic without obvious abnormalities. No apparent alopecia or balding. Eyes:  No corneal or conjunctival inflammation noted. EOMI. Perrla. Funduscopic exam benign, without hemorrhages, exudates or papilledema. Vision grossly normal. Ears:  External ear exam shows no significant lesions or deformities.  Otoscopic examination reveals clear canals, tympanic membranes are intact bilaterally without bulging, retraction, inflammation or discharge. Hearing is grossly normal bilaterally. Nose:  External nasal examination shows no deformity or inflammation. Nasal mucosa are pink and moist without lesions or exudates. Mouth:  Oral mucosa and oropharynx without lesions or exudates.  Teeth in good repair. Neck:  No deformities, masses, or tenderness noted. Chest Wall:  No deformities, masses, tenderness or gynecomastia noted. Lungs:  symmetrical breath sounds bilateral late expiratory wheezing   Problems:  Medical Problems Added: 1)  Dx of Asthma Nos W/acute Exacerbation  (ICD-493.92)  Complete Medication List: 1)  Atenolol-chlorthalidone 50-25 Mg Tabs (Atenolol-chlorthalidone) .... Take 1 tablet by mouth every morning 2)  Adult Aspirin Low Strength 81 Mg Chew (Aspirin) 3)  Betamethasone Dipropionate 0.05 % Crea (Betamethasone dipropionate) .... Apply at bedtime 4)  Klor-con M20 20 Meq Cr-tabs (Potassium chloride crys cr) .... Take 3 tabs by mouth once daily 5)  Prednisone 20 Mg Tabs (Prednisone) .... Uad 6)  Hydromet 5-1.5 Mg/16ml Syrp (Hydrocodone-homatropine) .... 1/2 tsp at bedtime as needed  Patient Instructions: 1)  take 2  prednisone tablets daily until you well and then begin a slow taper by taking one tablet daily for 3 days, a half for 3 days, then half a tablet Monday, Wednesday, Friday, for a two week taper Prescriptions: HYDROMET 5-1.5 MG/5ML SYRP (  HYDROCODONE-HOMATROPINE) 1/2 tsp at bedtime as needed  #8oz x 1   Entered and  Authorized by:   Roderick Pee MD   Signed by:   Roderick Pee MD on 06/06/2010   Method used:   Print then Give to Patient   RxID:   [id number] PREDNISONE 20 MG TABS (PREDNISONE) UAD  #50 x 1   Entered and Authorized by:   Roderick Pee MD   Signed by:   Roderick Pee MD on 06/06/2010   Method used:   Print then Give to Patient   RxID:   [id number] KLOR-CON M20 20 MEQ CR-TABS (POTASSIUM CHLORIDE CRYS CR) take 3 tabs by mouth once daily  #270 x 3   Entered and Authorized by:   Roderick Pee MD   Signed by:   Roderick Pee MD on 06/06/2010   Method used:   Print then Give to Patient   RxID:   [id number] BETAMETHASONE DIPROPIONATE 0.05 % CREA (BETAMETHASONE DIPROPIONATE) apply at bedtime  #60 gr. x 3   Entered and Authorized by:   Roderick Pee MD   Signed by:   Roderick Pee MD on 06/06/2010   Method used:   Print then Give to Patient   RxID:   [id number] ATENOLOL-CHLORTHALIDONE 50-25 MG  TABS (ATENOLOL-CHLORTHALIDONE) Take 1 tablet by mouth every morning  #100 x 3   Entered and Authorized by:   Roderick Pee MD   Signed by:   Roderick Pee MD on 06/06/2010   Method used:   Print then Give to Patient   RxID:   [id number] PREDNISONE 20 MG TABS (PREDNISONE) UAD  #50 x 1   Entered and Authorized by:   Roderick Pee MD   Signed by:   Roderick Pee MD on 06/06/2010   Method used:   Electronically to        [id number] (retail[facility]       [address]       [place], [place]  [address]       Ph: [phone] or [phone]       Fax: [phone]   RxID:   [id number]    Orders Added: 1)  Est. Patient Level III [46[address]]

## 2010-06-29 NOTE — Assessment & Plan Note (Signed)
Summary: chest congestion/njr   Vital Signs:  Patient profile:   70 year old male Weight:      242 pounds Temp:     98.5 degrees F oral BP sitting:   112 / 80  (left arm) Cuff size:   regular  Vitals Entered By: [name]) (February 28, 2010 10:56 AM) CC: chest congestion   CC:  chest congestion.  History of Present Illness:  [name] is a 70 year old male, who comes in with a 4 month history of coughing.  He states about early June.  He began coughing two weeks ago.  He went to the beach and cough got worse.  He said no fever, chills, shortness of breath, et Karie Soda.  He has had a history of allergic rhinitis, but never asthma.  He is a nonsmoker.  He has had some discolored sputum and some blood recently.  No weight loss et Karie Soda.  Allergies: 1)  ! Penicillin  Past History:  Past medical, surgical, family and social histories (including risk factors) reviewed for relevance to current acute and chronic problems.  Past Medical History: Reviewed history from 08/08/2007 and no changes required. ED Allergic rhinitis Hypertension Osteoarthritis  Past Surgical History: Reviewed history from 03/24/2007 and no changes required. Colonoscopy Inguinal herniorrhaphy  Family History: Reviewed history from 03/24/2007 and no changes required. Family History of CAD Male 1st degree relative <50 Family History Hypertension Family History of Stroke F 1st degree relative <60 Family History of Stroke M 1st degree relative <50 Family History Weight disorder Family History of Cardiovascular disorder Family History of Respiratory disease  Social History: Reviewed history from 03/24/2007 and no changes required. Retired Married Former Smoker Alcohol use-yes Regular exercise-yes  Review of Systems      See HPI  Physical Exam  General:  Well-developed,well-nourished,in no acute distress; alert,appropriate and cooperative throughout examination Head:  Normocephalic  and atraumatic without obvious abnormalities. No apparent alopecia or balding. Eyes:  No corneal or conjunctival inflammation noted. EOMI. Perrla. Funduscopic exam benign, without hemorrhages, exudates or papilledema. Vision grossly normal. Ears:  External ear exam shows no significant lesions or deformities.  Otoscopic examination reveals clear canals, tympanic membranes are intact bilaterally without bulging, retraction, inflammation or discharge. Hearing is grossly normal bilaterally. Nose:  External nasal examination shows no deformity or inflammation. Nasal mucosa are pink and moist without lesions or exudates. Mouth:  Oral mucosa and oropharynx without lesions or exudates.  Teeth in good repair. Neck:  No deformities, masses, or tenderness noted. Chest Wall:  No deformities, masses, tenderness or gynecomastia noted. Lungs:  decreased breath sounds bilaterally.  No wheezing   Problems:  Medical Problems Added: 1)  Dx of Cough  (ICD-786.2)  Impression & Recommendations:  Problem # 1:  COUGH (ICD-786.2) Assessment New  Orders: Prescription Created Electronically [id number]) T-2 View CXR (71020TC)  Complete Medication List: 1)  Atenolol-chlorthalidone 50-25 Mg Tabs (Atenolol-chlorthalidone) .... Take 1 tablet by mouth every morning 2)  Adult Aspirin Low Strength 81 Mg Chew (Aspirin) 3)  Betamethasone Dipropionate 0.05 % Crea (Betamethasone dipropionate) .... Apply at bedtime 4)  Klor-con M20 20 Meq Cr-tabs (Potassium chloride crys cr) .... Take 3 tabs by mouth once daily 5)  Prednisone 20 Mg Tabs (Prednisone) .... Uad 6)  Hydromet 5-1.5 Mg/64ml Syrp (Hydrocodone-homatropine) .... 1/2 tsp at bedtime as needed  Patient Instructions: 1)  begin prednisone, take two tabs x 3 days, one x 3 days, and half x 3 days, then a half a tablet Monday,  Wednesday, Friday, for a two week taper. 2)  Go to the main office now for your chest x-ray. 3)  Return in two weeks for follow-up. 4)  Drink 30  ounces of water daily and he can take a half a teaspoon of Hydromet at bedtime as needed for nighttime cough Prescriptions: HYDROMET 5-1.5 MG/5ML SYRP (HYDROCODONE-HOMATROPINE) 1/2 tsp at bedtime as needed  #4oz x 1   Entered and Authorized by:   Roderick Pee MD   Signed by:   Roderick Pee MD on 02/28/2010   Method used:   Print then Give to Patient   RxID:   [id number] PREDNISONE 20 MG TABS (PREDNISONE) UAD  #30 x 1   Entered and Authorized by:   Roderick Pee MD   Signed by:   Roderick Pee MD on 02/28/2010   Method used:   Electronically to        [id number]       [address]       [place], [place]  [address]       Ph: [phone] or [phone]       Fax: [phone]   RxID:   [id number]

## 2010-06-29 NOTE — Assessment & Plan Note (Signed)
Summary: hospital follow up/mhf   Vital Signs:  Patient profile:   70 year old male Weight:      228 pounds Temp:     97.8 degrees F oral BP sitting:   114 / 70  (left arm) Cuff size:   regular  Vitals Entered By: Kern Reap CMA (December 28, 2008 1:36 PM)  Reason for Visit follow up hospital  History of Present Illness: [name] is a 70 year old male, who comes back today for evaluation of a cellulitis of his left foot.  He was hospitalized last week for 5 days because severe cellulitis of his left foot.  He was discharged by Dr. Yetta Barre on Ceftin 250 mg b.i.d. and Septra DS b.i.d.  The infection is resolved.  The scar tissue still present.  He has an appointment to see Jacqulyn Liner CVT.  S. for vascular evaluation and Dr. Berna Spare duda  orthopedist.  he has also seen Dr. Leonides Grills would prefer Dr. Berna Spare.  He's had no side effects to medications, specifically, no diarrhea  Allergies (verified): No Known Drug Allergies  Past History:  Past medical, surgical, family and social histories (including risk factors) reviewed, and no changes noted (except as noted below).  Past Medical History: Reviewed history from 08/08/2007 and no changes required. ED Allergic rhinitis Hypertension Osteoarthritis  Past Surgical History: Reviewed history from 03/24/2007 and no changes required. Colonoscopy Inguinal herniorrhaphy  Family History: Reviewed history from 03/24/2007 and no changes required. Family History of CAD Male 1st degree relative <50 Family History Hypertension Family History of Stroke F 1st degree relative <60 Family History of Stroke M 1st degree relative <50 Family History Weight disorder Family History of Cardiovascular disorder Family History of Respiratory disease  Social History: Reviewed history from 03/24/2007 and no changes required. Retired Married Former Smoker Alcohol use-yes Regular exercise-yes  Review of Systems      See HPI  Physical  Exam  General:  Well-developed,well-nourished,in no acute distress; alert,appropriate and cooperative throughout examination Skin:  no redness no pus.  The scar tissue still present.  A slight tear in the skin where he had tape on it.  Advised not to use tape on the skin use Telfa, and an Ace wrap or co-ban     Impression & Recommendations:  Problem # 1:  CELLULITIS, LEG, LEFT (ICD-682.6) Assessment Deteriorated  His updated medication list for this problem includes:    Sulfamethoxazole-tmp Ds 800-160 Mg Tabs (Sulfamethoxazole-trimethoprim) .Marland Kitchen... Take one tab two times a day for 14 days    Cefuroxime Axetil 500 Mg Tabs (Cefuroxime axetil) .Marland Kitchen... Take one tab two times a day for 14 days  Complete Medication List: 1)  Atenolol-chlorthalidone 50-25 Mg Tabs (Atenolol-chlorthalidone) .... Take 1 tablet by mouth every morning 2)  Adult Aspirin Low Strength 81 Mg Chew (Aspirin) 3)  Viagra 100 Mg Tabs (Sildenafil citrate) .... Take as directed 4)  Potassium Chloride Crys Cr 20 Meq Tbcr (Potassium chloride crys cr) .... Three times a day 5)  Betamethasone Dipropionate 0.05 % Crea (Betamethasone dipropionate) .... Apply at bedtime 6)  Sulfamethoxazole-tmp Ds 800-160 Mg Tabs (Sulfamethoxazole-trimethoprim) .... Take one tab two times a day for 14 days 7)  Cefuroxime Axetil 500 Mg Tabs (Cefuroxime axetil) .... Take one tab two times a day for 14 days  Patient Instructions: 1)  continue the antibiotics.  Make an appointment to see the orthopedist and a vascular surgeon for further evaluation 2)  Please schedule a follow-up appointment as needed.

## 2010-06-29 NOTE — Progress Notes (Signed)
  Phone Note Call from Patient   Caller: Patient Call For: Roderick Pee MD Summary of Call: SEVERE HEADACHE.........onset sudden.........advised ERASAP. Initial call taken by: Lynann Beaver CMA AAMA,  May 08, 2010 4:51 PM  Follow-up for Phone Call        have somebody take him directly to [facility] emergency room Follow-up by: Roderick Pee MD,  May 08, 2010 4:55 PM  Additional Follow-up for Phone Call Additional follow up Details #1::        Original note stated that. Additional Follow-up by: Lynann Beaver CMA AAMA,  May 08, 2010 5:03 PM

## 2010-06-29 NOTE — Assessment & Plan Note (Signed)
Summary: pt will come in fasting/njr   Vital Signs:  Patient Profile:   70 Years Old Male Height:     69.5 inches Weight:      227.2 pounds Temp:     98.1 degrees F oral Pulse rate:   64 / minute BP sitting:   122 / 78  (left arm)  Vitals Entered By: Doristine Devoid (Sep 30, 2007 9:44 AM)                 Chief Complaint:  cpx.  History of Present Illness: [name]  is a 70 year old, married former Patent examiner who comes in today for evaluation of hypertension erectile dysfunction and allergic rhinitis.  His hypertension is treated with Tenoretic 50 -- 25 daily.  His blood pressure is 122/78.  He's had no side effects from the medication.  He developed marked hypokalemia and takes 320 mEq potassium tablets daily.  The ED history with around her milligrams p.r.n.  The allergic rhinitis and treated with over-the-counter Claritin.  His is up on his.  Health maintenance.  Activities    Current Allergies: No known allergies   Past Medical History:    Reviewed history from 08/08/2007 and no changes required:       ED       Allergic rhinitis       Hypertension       Osteoarthritis   Family History:    Reviewed history from 03/24/2007 and no changes required:       Family History of CAD Male 1st degree relative <50       Family History Hypertension       Family History of Stroke F 1st degree relative <60       Family History of Stroke M 1st degree relative <50       Family History Weight disorder       Family History of Cardiovascular disorder       Family History of Respiratory disease  Social History:    Reviewed history from 03/24/2007 and no changes required:       Retired       Married       Former Smoker       Alcohol use-yes       Regular exercise-yes    Review of Systems      See HPI   Physical Exam  General:     Well-developed,well-nourished,in no acute distress; alert,appropriate and cooperative throughout examination Head:  Normocephalic and atraumatic without obvious abnormalities. No apparent alopecia or balding. Eyes:     No corneal or conjunctival inflammation noted. EOMI. Perrla. Funduscopic exam benign, without hemorrhages, exudates or papilledema. Vision grossly normal. Ears:     External ear exam shows no significant lesions or deformities.  Otoscopic examination reveals clear canals, tympanic membranes are intact bilaterally without bulging, retraction, inflammation or discharge. Hearing is grossly normal bilaterally. Nose:     External nasal examination shows no deformity or inflammation. Nasal mucosa are pink and moist without lesions or exudates. Mouth:     Oral mucosa and oropharynx without lesions or exudates.  Teeth in good repair. Neck:     No deformities, masses, or tenderness noted. Chest Wall:     No deformities, masses, tenderness or gynecomastia noted. Breasts:     No masses or gynecomastia noted Lungs:     Normal respiratory effort, chest expands symmetrically. Lungs are clear to auscultation, no crackles or wheezes. Heart:     Normal rate  and regular rhythm. S1 and S2 normal without gallop, murmur, click, rub or other extra sounds. Abdomen:     Bowel sounds positive,abdomen soft and non-tender without masses, organomegaly or hernias noted. Rectal:     No external abnormalities noted. Normal sphincter tone. No rectal masses or tenderness. Genitalia:     Testes bilaterally descended without nodularity, tenderness or masses. No scrotal masses or lesions. No penis lesions or urethral discharge. Prostate:     Prostate gland firm and smooth, no enlargement, nodularity, tenderness, mass, asymmetry or induration. Msk:     No deformity or scoliosis noted of thoracic or lumbar spine.   Pulses:     R and L carotid,radial,femoral,dorsalis pedis and posterior tibial pulses are full and equal bilaterally Extremities:     No clubbing, cyanosis, edema, or deformity noted with normal full range of  motion of all joints.   Neurologic:     No cranial nerve deficits noted. Station and gait are normal. Plantar reflexes are down-going bilaterally. DTRs are symmetrical throughout. Sensory, motor and coordinative functions appear intact. Skin:     Intact without suspicious lesions or rashes Cervical Nodes:     No lymphadenopathy noted Axillary Nodes:     No palpable lymphadenopathy Inguinal Nodes:     No significant adenopathy Psych:     Cognition and judgment appear intact. Alert and cooperative with normal attention span and concentration. No apparent delusions, illusions, hallucinations    Impression & Recommendations:  Problem # 1:  OSTEOARTHRITIS (ICD-715.90) Assessment: Deteriorated  His updated medication list for this problem includes:    Adult Aspirin Low Strength 81 Mg Chew (Aspirin)  Orders: Venipuncture ([address]) TLB-Lipid Panel (80061-LIPID) TLB-BMP (Basic Metabolic Panel-BMET) (80048-METABOL) TLB-CBC Platelet - w/Differential (85025-CBCD) TLB-Hepatic/Liver Function Pnl (80076-HEPATIC) TLB-TSH (Thyroid Stimulating Hormone) (84443-TSH) TLB-PSA (Prostate Specific Antigen) (84153-PSA)   Problem # 2:  HYPERTENSION (ICD-401.9) Assessment: Improved  His updated medication list for this problem includes:    Atenolol-chlorthalidone 50-25 Mg Tabs (Atenolol-chlorthalidone) .Marland Kitchen... Take 1 tablet by mouth every morning  Orders: EKG w/ Interpretation ([address]) UA Dipstick w/o Micro (automated)  (81003) Venipuncture (60[address]) TLB-Lipid Panel (80061-LIPID) TLB-BMP (Basic Metabolic Panel-BMET) (80048-METABOL) TLB-CBC Platelet - w/Differential (85025-CBCD) TLB-Hepatic/Liver Function Pnl (80076-HEPATIC) TLB-TSH (Thyroid Stimulating Hormone) (84443-TSH) TLB-PSA (Prostate Specific Antigen) (84153-PSA)   Problem # 3:  ALLERGIC RHINITIS (ICD-477.9) Assessment: Improved  Orders: Venipuncture ([address]11) TLB-Lipid Panel (80061-LIPID) TLB-BMP (Basic Metabolic Panel-BMET)  (80048-METABOL) TLB-CBC Platelet - w/Differential (85025-CBCD) TLB-Hepatic/Liver Function Pnl (80076-HEPATIC) TLB-TSH (Thyroid Stimulating Hormone) (84443-TSH) TLB-PSA (Prostate Specific Antigen) (84153-PSA)   Problem # 4:  ERECTILE DYSFUNCTION (ICD-302.72) Assessment: Improved  His updated medication list for this problem includes:    Viagra 100 Mg Tabs (Sildenafil citrate)  Orders: Venipuncture (91478) TLB-Lipid Panel (80061-LIPID) TLB-BMP (Basic Metabolic Panel-BMET) (80048-METABOL) TLB-CBC Platelet - w/Differential (85025-CBCD) TLB-Hepatic/Liver Function Pnl (80076-HEPATIC) TLB-TSH (Thyroid Stimulating Hormone) (84443-TSH) TLB-PSA (Prostate Specific Antigen) (84153-PSA)   Complete Medication List: 1)  Atenolol-chlorthalidone 50-25 Mg Tabs (Atenolol-chlorthalidone) .... Take 1 tablet by mouth every morning 2)  Adult Aspirin Low Strength 81 Mg Chew (Aspirin) 3)  Viagra 100 Mg Tabs (Sildenafil citrate) 4)  Potassium Chloride Crys Cr 20 Meq Tbcr (Potassium chloride crys cr) .... Three times a day   Patient Instructions: 1)  take 800 mg of Motrin and our before, you exercise.  If this helps with you joint pain, then continue that.  If not take 800 mg twice a day.  Daily. 2)  Please schedule a follow-up appointment in 1 year.   Prescriptions: POTASSIUM CHLORIDE  CRYS CR 20 MEQ  TBCR (POTASSIUM CHLORIDE CRYS CR) three times a day  #300 x 4   Entered and Authorized by:   Roderick Pee MD   Signed by:   Roderick Pee MD on 09/30/2007   Method used:   Elect[facility] to ...       [id number]       [address]       [place], [place]  [address]       Ph: [phone] or [phone]       Fax: [phone]   RxID:   [id number] VIAGRA 100 MG  TABS (SILDENAFIL CITRATE)   #6 x 11   Entered and Authorized by:   Roderick Pee MD   Signed by:   Roderick Pee MD on 09/30/2007   Method used:   Elect[facility] sent to ...        [id number]       [address]       [place], [place]  [address]       Ph: [phone] or [phone]       Fax: [phone]   RxID:   [id number] ATENOLOL-CHLORTHALIDONE 50-25 MG  TABS (ATENOLOL-CHLORTHALIDONE) Take 1 tablet by mouth every morning  #100 x 4   Entered and Authorized by:   Roderick Pee MD   Signed by:   Roderick Pee MD on 09/30/2007   Method used:   Elect[facility] to ...       [id number]       [address]       [place], [place]  [address]       Ph: [phone] or [phone]       Fax: [phone]   RxID:   [id number]  ]  Appended Document: Orders Update    Clinical Lists Changes  Observations: Added new observation of COMMENTS: Wynona Canes, CMA  Sep 30, 2007 1:32 PM  (09/30/2007 13:32) Added new observation of PH URINE: 5.5  (09/30/2007 13:32) Added new observation of SPEC GR URIN: 1.015  (09/30/2007 13:32) Added new observation of APPEARANCE U: Clear  (09/30/2007 13:32) Added new observation of UA COLOR: yellow  (09/30/2007 13:32) Added new observation of WBC DIPSTK U: negative  (09/30/2007 13:32) Added new observation of NITRITE URN: negative  (09/30/2007 13:32) Added new observation of UROBILINOGEN: 1.0  (09/30/2007 13:32) Added new observation of PROTEIN, URN: negative  (09/30/2007 13:32) Added new observation of BLOOD UR DIP: negative  (09/30/2007 13:32) Added new observation of KETONES URN: negative  (09/30/2007 13:32) Added new observation of BILIRUBIN UR: 1+  (09/30/2007 13:32) Added new observation of GLUCOSE, URN: negative  (09/30/2007 13:32)       Laboratory Results   Urine Tests   Date/Time Reported: Sep 30, 2007 1:32 PM   Routine Urinalysis   Color: yellow Appearance: Clear Glucose: negative   (Normal Range: Negative) Bilirubin: 1+   (Normal Range: Negative) Ketone: negative   (Normal Range: Negative) Spec.  Gravity: 1.015   (Normal Range: 1.003-1.035) Blood: negative   (Normal Range: Negative) pH: 5.5   (Normal Range: 5.0-8.0) Protein: negative   (Normal Range: Negative) Urobilinogen: 1.0   (Normal Range: 0-1) Nitrite: negative   (Normal Range: Negative) Leukocyte Esterace:  negative   (Normal Range: Negative)    Comments: Wynona Canes, CMA  Sep 30, 2007 1:32 PM

## 2010-06-29 NOTE — Progress Notes (Signed)
Summary: call pt to pick up lab work.  Phone Note Call from Patient Call back at Home Phone [phone]   Caller: Patient Call For: Tawanna Cooler Summary of Call: wants copies of labwork will pick up this afternoon Initial call taken by: Barnie Mort,  Oct 09, 2007 8:45 AM  Follow-up for Phone Call        Phone Call Completed, called pt for pick up. Follow-up by: Drue Stager,  Oct 09, 2007 10:05 AM

## 2010-06-29 NOTE — Progress Notes (Signed)
Summary: diabetic?  Phone Note Call from Patient   Caller: Patient Call For: Roderick Pee MD Summary of Call: Pt is wanting to get Orthotics from Dr. Lajoyce Corners, but his insurance will not pay unless he is diabetic.  Asking if he is diabetic......Marland Kitchenhe thought he was borderline and controlling it with diet and exercise. [phone] Initial call taken by: Lynann Beaver CMA,  October 31, 2009 2:43 PM  Follow-up for Phone Call        patient has diabetes, controlled with diet and exercise.  Okay to send a note to that effect Follow-up by: Roderick Pee MD,  October 31, 2009 5:21 PM  Additional Follow-up for Phone Call Additional follow up Details #1::        Pt notified, and letter written. Additional Follow-up by: Lynann Beaver CMA,  November 01, 2009 8:42 AM

## 2010-06-29 NOTE — Assessment & Plan Note (Signed)
Summary: painful knot on chest/.mhf   Vital Signs:  Patient Profile:   70 Years Old Male Height:     69.5 inches Weight:      236 pounds Temp:     98.4 degrees F oral BP sitting:   132 / 82  (left arm) Cuff size:   regular  Vitals Entered By: Kern Reap CMA (May 31, 2008 2:48 PM)                 Chief Complaint:  painful knot on chest.  History of Present Illness: [name] is a 70 year old male, who comes in today for evaluation of two problems.  He noticed a knot under his right breast about a week ago.  It's tender and sore.  He also has a history of dry skin.  He saw the dermatologist 5 years ago and given betamethasone .05% cream to use p.r.n.Marland Kitchen    Updated Prior Medication List: ATENOLOL-CHLORTHALIDONE 50-25 MG  TABS (ATENOLOL-CHLORTHALIDONE) Take 1 tablet by mouth every morning ADULT ASPIRIN LOW STRENGTH 81 MG  CHEW (ASPIRIN)  VIAGRA 100 MG  TABS (SILDENAFIL CITRATE) take as directed POTASSIUM CHLORIDE CRYS CR 20 MEQ  TBCR (POTASSIUM CHLORIDE CRYS CR) three times a day  Current Allergies: No known allergies   Past Medical History:    Reviewed history from 08/08/2007 and no changes required:       ED       Allergic rhinitis       Hypertension       Osteoarthritis   Social History:    Reviewed history from 03/24/2007 and no changes required:       Retired       Married       Former Smoker       Alcohol use-yes       Regular exercise-yes    Review of Systems      See HPI   Physical Exam  General:     Well-developed,well-nourished,in no acute distress; alert,appropriate and cooperative throughout examination Skin:     there is a marble-sized subcutaneous soft, movable lesion below the right nipple.  Also skin is dry and red, especially on the forearms    Impression & Recommendations:  Problem # 1:  LIPOMA, SKIN (ICD-214.1) Assessment: New  Problem # 2:  DRY SKIN (ICD-701.1) Assessment: Deteriorated  Complete Medication List: 1)   Atenolol-chlorthalidone 50-25 Mg Tabs (Atenolol-chlorthalidone) .... Take 1 tablet by mouth every morning 2)  Adult Aspirin Low Strength 81 Mg Chew (Aspirin) 3)  Viagra 100 Mg Tabs (Sildenafil citrate) .... Take as directed 4)  Potassium Chloride Crys Cr 20 Meq Tbcr (Potassium chloride crys cr) .... Three times a day 5)  Betamethasone Dipropionate 0.05 % Crea (Betamethasone dipropionate) .... Apply at bedtime   Patient Instructions: 1)  be sure to use warm water when you shower. Only puts soap.  On your scalp, and groin area.  Before you tell off, rub all over with baby low.  He may also use.  The Eucerin or udder cream along with the betamethasone p.r.n. 2)  Return for reevaluation.  If the bump below ear.  Her right nipple increases in size   Prescriptions: BETAMETHASONE DIPROPIONATE 0.05 % CREA (BETAMETHASONE DIPROPIONATE) apply at bedtime  #60 gr. x 4   Entered and Authorized by:   Roderick Pee MD   Signed by:   Roderick Pee MD on 05/31/2008   Method used:   [id number]       [address]       [place], [place]  [address]       Ph: [phone] or [phone]       Fax: [phone]   RxID:   [id number]  ]

## 2010-06-29 NOTE — Assessment & Plan Note (Signed)
Summary: trouble walking/njr   Vital Signs:  Patient Profile:   70 Years Old Male Weight:      239 pounds Temp:     97.8 degrees F oral BP sitting:   138 / 84  (left arm)  Vitals Entered By: Rock Nephew CMA (August 08, 2007 3:32 PM)                 Chief Complaint:  stiff right ankle since last night having trouble walking on it.  History of Present Illness: [name] is a 70 year old male, who comes in today for evaluation of right foot pain.  He played golf yesterday and had no trauma.  He woke up this morning with severe pain in the dorsum of his right foot.  It is not had a history of gout.  He also complains of severe pain in both knees.  He says is getting more difficult to walk.  He would like a consultation.  I recommended Dr. Claudie Leach or Dr. Charlann Boxer.     Current Allergies: No known allergies   Past Medical History:    Reviewed history from 03/24/2007 and no changes required:       ED       Allergic rhinitis       Hypertension       Osteoarthritis   Family History:    Reviewed history from 03/24/2007 and no changes required:       Family History of CAD Male 1st degree relative <50       Family History Hypertension       Family History of Stroke F 1st degree relative <60       Family History of Stroke M 1st degree relative <50       Family History Weight disorder       Family History of Cardiovascular disorder       Family History of Respiratory disease  Social History:    Reviewed history from 03/24/2007 and no changes required:       Retired       Married       Former Smoker       Alcohol use-yes       Regular exercise-yes    Review of Systems      See HPI   Physical Exam  General:     Well-developed,well-nourished,in no acute distress; alert,appropriate and cooperative throughout examination Msk:     the ankle itself is normal.  There is tenderness in the flexor tendon. Pulses:     R and L carotid,radial,femoral,dorsalis pedis and posterior  tibial pulses are full and equal bilaterally Extremities:     No clubbing, cyanosis, edema, or deformity noted with normal full range of motion of all joints.      Impression & Recommendations:  Problem # 1:  TENDINITIS (ICD-726.90) Assessment: New  Problem # 2:  OSTEOARTHRITIS (ICD-715.90) Assessment: New  His updated medication list for this problem includes:    Adult Aspirin Low Strength 81 Mg Chew (Aspirin)   Complete Medication List: 1)  Atenolol-chlorthalidone 50-25 Mg Tabs (Atenolol-chlorthalidone) 2)  Adult Aspirin Low Strength 81 Mg Chew (Aspirin) 3)  Viagra 100 Mg Tabs (Sildenafil citrate)   Patient Instructions: 1)  begin prednisone 20 mg two tablets daily for 3 days, one a day for 3 days, a half a tablet a day for 3 days, then half a tablet every other day for two week taper.  If after 3 days.  The pain near ankle is gone  and stopped prednisone. 2)  Clubbing spur.  Orthopedics next week make an appointment for a knee consultation    ]

## 2010-06-29 NOTE — Assessment & Plan Note (Signed)
Summary: 2wk rov/njr   Vital Signs:  Patient profile:   70 year old male Weight:      234 pounds Temp:     99.0 degrees F oral BP sitting:   108 / 80  (left arm) Cuff size:   regular  Vitals Entered By: [name]) (March 14, 2010 9:13 AM) CC: follow-up visit   CC:  follow-up visit.  History of Present Illness: [name] is a 70 year old male, married, nonsmoker, who comes in today for reevaluation of a cough.  We saw him October the fourth at that time be been complaining of a cough for about 3 to 4 months.  His exam was negative except for some expiratory wheezing.  Chest x-ray showed a little cardiomegaly, but no malignancies we start him on prednisone.  Now a stent to 10 mg Monday, Wednesday, Friday, and feels 75% improved.  His BP is 108/80 on Tenoretic 50 -- 25 daily  Allergies: 1)  ! Penicillin  Past History:  Past medical, surgical, family and social histories (including risk factors) reviewed for relevance to current acute and chronic problems.  Past Medical History: Reviewed history from 08/08/2007 and no changes required. ED Allergic rhinitis Hypertension Osteoarthritis  Past Surgical History: Reviewed history from 03/24/2007 and no changes required. Colonoscopy Inguinal herniorrhaphy  Family History: Reviewed history from 03/24/2007 and no changes required. Family History of CAD Male 1st degree relative <50 Family History Hypertension Family History of Stroke F 1st degree relative <60 Family History of Stroke M 1st degree relative <50 Family History Weight disorder Family History of Cardiovascular disorder Family History of Respiratory disease  Social History: Reviewed history from 03/24/2007 and no changes required. Retired Married Former Smoker Alcohol use-yes Regular exercise-yes  Review of Systems      See HPI  Physical Exam  General:  Well-developed,well-nourished,in no acute distress; alert,appropriate and cooperative  throughout examination Neck:  No deformities, masses, or tenderness noted. Chest Wall:  No deformities, masses, tenderness or gynecomastia noted. Lungs:  symmetrical breath sounds wheezing, and gone   Impression & Recommendations:  Problem # 1:  COUGH (ICD-786.2) Assessment Improved  Problem # 2:  HYPERTENSION (ICD-401.9) Assessment: Deteriorated  His updated medication list for this problem includes:    Atenolol-chlorthalidone 50-25 Mg Tabs (Atenolol-chlorthalidone) .Marland Kitchen... Take 1 tablet by mouth every morning  Complete Medication List: 1)  Atenolol-chlorthalidone 50-25 Mg Tabs (Atenolol-chlorthalidone) .... Take 1 tablet by mouth every morning 2)  Adult Aspirin Low Strength 81 Mg Chew (Aspirin) 3)  Betamethasone Dipropionate 0.05 % Crea (Betamethasone dipropionate) .... Apply at bedtime 4)  Klor-con M20 20 Meq Cr-tabs (Potassium chloride crys cr) .... Take 3 tabs by mouth once daily 5)  Prednisone 20 Mg Tabs (Prednisone) .... Uad 6)  Hydromet 5-1.5 Mg/53ml Syrp (Hydrocodone-homatropine) .... 1/2 tsp at bedtime as needed  Patient Instructions: 1)  take a half of prednisone tablet Monday, Wednesday, Friday, for 3 weeks, then stop the prednisone. 2)  At the Tenoretic and half.  Take a half a tablet daily in the morning.  Check y  blood pressure daily if after 4 weeks y  blood pressure does not come up to 130........135 systolic, then call   Orders Added: 1)  Est. Patient Level IV [[address]]

## 2010-06-29 NOTE — Letter (Signed)
Summary: Colonoscopy Date Change Letter  [facility]  [address] [place], [place] [address]   Phone: [phone]  Fax: [phone]      September 02, 2009 MRN: [id number]   KAHEEM HALLECK [address] [place], [place]  [address]   Dear Mr. SLINKER,   Previously you were recommended to have a repeat colonoscopy around this time. Your chart was recently reviewed by Dr. Sheryn Bison of [facility]. Follow up colonoscopy is now recommended in May 2016. This revised recommendation is based on current, nationally recognized guidelines for colorectal cancer screening and polyp surveillance. These guidelines are endorsed by the American Cancer Society, [employer] on Colorectal Cancer as well as numerous other major medical organizations.  Please understand that our recommendation assumes that you do not have any new symptoms such as bleeding, a change in bowel habits, anemia, or significant abdominal discomfort. If you do have any concerning GI symptoms or want to discuss the guideline recommendations, please call to arrange an office visit at your earliest convenience. Otherwise we will keep you in our reminder system and contact you 1-2 months prior to the date listed above to schedule your next colonoscopy.  Thank you,   Vania Rea. Jarold Motto, M.D.  [facility] [phone]

## 2010-06-29 NOTE — Assessment & Plan Note (Signed)
Summary: EMP-WILL FAST//CCM   Vital Signs:  Patient profile:   70 year old male Height:      69.5 inches Weight:      2328 pounds BMI:     340.08 Temp:     98.3 degrees F oral BP sitting:   120 / 80  (left arm) Cuff size:   regular  Vitals Entered By: [name]) (Oct 11, 2009 9:19 AM) CC: cpx   CC:  cpx.  History of Present Illness: [name] is a 70 year old, married male, retired Banker nonsmoker, who comes in today for evaluation of hypertension.  As always been in excellent health.  He said some mild hypertension treated with Tenoretic 50 -- 25 daily.  BP 120/80.  He takes 3, potassium supplement tablets daily.  However, he states are getting stuck in his lower esophagus as the pills or so bay.  He would like to switch to another product.  His wife is disabled.  Parkinson's disease.  Multiple other problems therefore, does not need is for a refill.  He uses betamethasone p.r.n. for dyshidrotic eczema.  He also has a lesion on his right foot.  It's where the skin get infected.  Last year.  He had to be hospitalized with a severe cellulitis.  Advised to go back and see Dr. Lajoyce Corners his orthopedist for evaluation.  His past medical history, social history, family history in detail.  No significant changes.  He continues to remain physmal ADLs.  Normal fall wrist none of home safety.  Normal height, weight, normally gets annual eye exams.  He was counseled to continue his exercise program for physical and mental stress relief.  Tetanus 2008 seasonal flu shot 2010 Pneumovax 2007........ information given on shingles  Allergies (verified): 1)  ! Penicillin  Past History:  Past medical, surgical, family and social histories (including risk factors) reviewed, and no changes noted (except as noted below).  Past Medical History: Reviewed history from 08/08/2007 and no changes required. ED Allergic rhinitis Hypertension Osteoarthritis  Past Surgical History: Reviewed  history from 03/24/2007 and no changes required. Colonoscopy Inguinal herniorrhaphy  Family History: Reviewed history from 03/24/2007 and no changes required. Family History of CAD Male 1st degree relative <50 Family History Hypertension Family History of Stroke F 1st degree relative <60 Family History of Stroke M 1st degree relative <50 Family History Weight disorder Family History of Cardiovascular disorder Family History of Respiratory disease  Social History: Reviewed history from 03/24/2007 and no changes required. Retired Married Former Smoker Alcohol use-yes Regular exercise-yes  Review of Systems      See HPI  Physical Exam  General:  Well-developed,well-nourished,in no acute distress; alert,appropriate and cooperative throughout examination Head:  Normocephalic and atraumatic without obvious abnormalities. No apparent alopecia or balding. Eyes:  No corneal or conjunctival inflammation noted. EOMI. Perrla. Funduscopic exam benign, without hemorrhages, exudates or papilledema. Vision grossly normal. Ears:  External ear exam shows no significant lesions or deformities.  Otoscopic examination reveals clear canals, tympanic membranes are intact bilaterally without bulging, retraction, inflammation or discharge. Hearing is grossly normal bilaterally. Nose:  External nasal examination shows no deformity or inflammation. Nasal mucosa are pink and moist without lesions or exudates. Mouth:  Oral mucosa and oropharynx without lesions or exudates.  Teeth in good repair. Neck:  No deformities, masses, or tenderness noted. Chest Wall:  No deformities, masses, tenderness or gynecomastia noted. Breasts:  No masses or gynecomastia noted Lungs:  Normal respiratory effort, chest expands symmetrically. Lungs are  clear to auscultation, no crackles or wheezes. Heart:  Normal rate and regular rhythm. S1 and S2 normal without gallop, murmur, click, rub or other extra sounds. Abdomen:  Bowel  sounds positive,abdomen soft and non-tender without masses, organomegaly or hernias noted. Rectal:  No external abnormalities noted. Normal sphincter tone. No rectal masses or tenderness. Genitalia:  Testes bilaterally descended without nodularity, tenderness or masses. No scrotal masses or lesions. No penis lesions or urethral discharge. Prostate:  Prostate gland firm and smooth, no enlargement, nodularity, tenderness, mass, asymmetry or induration. Msk:  No deformity or scoliosis noted of thoracic or lumbar spine.   Pulses:  R and L carotid,radial,femoral,dorsalis pedis and posterior tibial pulses are full and equal bilaterally Extremities:  No clubbing, cyanosis, edema, or deformity noted with normal full range of motion of all joints.   Neurologic:  No cranial nerve deficits noted. Station and gait are normal. Plantar reflexes are down-going bilaterally. DTRs are symmetrical throughout. Sensory, motor and coordinative functions appear intact. Skin:  Intact without suspicious lesions or rashes Cervical Nodes:  No lymphadenopathy noted Axillary Nodes:  No palpable lymphadenopathy Inguinal Nodes:  No significant adenopathy Psych:  Cognition and judgment appear intact. Alert and cooperative with normal attention span and concentration. No apparent delusions, illusions, hallucinations   Impression & Recommendations:  Problem # 1:  HYPERTENSION (ICD-401.9) Assessment Improved  His updated medication list for this problem includes:    Atenolol-chlorthalidone 50-25 Mg Tabs (Atenolol-chlorthalidone) .Marland Kitchen... Take 1 tablet by mouth every morning  Orders: Venipuncture ([address]) TLB-Lipid Panel (80061-LIPID) TLB-BMP (Basic Metabolic Panel-BMET) (80048-METABOL) TLB-CBC Platelet - w/Differential (85025-CBCD) TLB-Hepatic/Liver Function Pnl (80076-HEPATIC) TLB-TSH (Thyroid Stimulating Hormone) (84443-TSH) TLB-PSA (Prostate Specific Antigen) (84153-PSA) Prescription Created Electronically [phone]) UA  Dipstick w/o Micro (automated)  (81003) EKG w/ Interpretation (93000)  Problem # 2:  ROUTINE GENERAL MEDICAL EXAM@HEALTH  CARE FACL (ICD-V70.0) Assessment: Unchanged  Orders: Venipuncture (09811) TLB-Lipid Panel (80061-LIPID) TLB-BMP (Basic Metabolic Panel-BMET) (80048-METABOL) TLB-CBC Platelet - w/Differential (85025-CBCD) TLB-Hepatic/Liver Function Pnl (80076-HEPATIC) TLB-TSH (Thyroid Stimulating Hormone) (84443-TSH) TLB-PSA (Prostate Specific Antigen) (84153-PSA) Prescription Created Electronically (B1478) UA Dipstick w/o Micro (automated)  (81003) EKG w/ Interpretation (93000)  Complete Medication List: 1)  Atenolol-chlorthalidone 50-25 Mg Tabs (Atenolol-chlorthalidone) .... Take 1 tablet by mouth every morning 2)  Adult Aspirin Low Strength 81 Mg Chew (Aspirin) 3)  Betamethasone Dipropionate 0.05 % Crea (Betamethasone dipropionate) .... Apply at bedtime 4)  K-prime 25 Meq Tbef (Potassium bicarbonate) .Marland Kitchen.. 1 daily  Patient Instructions: 1)  stop the potassium tablets..........effer-k 25 meq day 2)  Please schedule a follow-up appointment in 1 year. Prescriptions: K-PRIME 25 MEQ TBEF (POTASSIUM BICARBONATE) 1 daily  #100 x 3   Entered and Authorized by:   Roderick Pee MD   Signed by:   Roderick Pee MD on 10/11/2009   Method used:   Electronically to        [facility]       [address]       [place], [place]  [address]       Ph: [phone] or [phone]       Fax: [phone]   RxID:   [id number] BETAMETHASONE DIPROPIONATE 0.05 % CREA (BETAMETHASONE DIPROPIONATE) apply at bedtime  #60 gr. x 3   Entered and Authorized by:   Roderick Pee MD   Signed by:   Roderick Pee MD on 10/11/2009   Method used:   Electronically to        [id number]  [address]       [place], [place]  [address]       Ph: [phone] or [phone]       Fax: [phone]   RxID:   [id number] ATENOLOL-CHLORTHALIDONE 50-25 MG  TABS  (ATENOLOL-CHLORTHALIDONE) Take 1 tablet by mouth every morning  #100 x 3   Entered and Authorized by:   Roderick Pee MD   Signed by:   Roderick Pee MD on 10/11/2009   Method used:   Electronically to        [id number]       [address]       [place], [place]  [address]       Ph: [phone] or [phone]       Fax: [phone]   RxID:   [id number]    Immunization History:  Influenza Immunization History:    Influenza:  historical (02/25/2009)   Laboratory Results   Urine Tests  Date/Time Recieved: Oct 11, 2009 12:26 PM  Date/Time Reported: Oct 11, 2009 12:26 PM   Routine Urinalysis   Color: yellow Appearance: Clear Glucose: negative   (Normal Range: Negative) Bilirubin: 1+   (Normal Range: Negative) Ketone: negative   (Normal Range: Negative) Spec. Gravity: 1.020   (Normal Range: 1.003-1.035) Blood: negative   (Normal Range: Negative) pH: 7.0   (Normal Range: 5.0-8.0) Protein: trace   (Normal Range: Negative) Urobilinogen: 1.0   (Normal Range: 0-1) Nitrite: negative   (Normal Range: Negative) Leukocyte Esterace: negative   (Normal Range: Negative)    Comments: Wynona Canes, CMA  Oct 11, 2009 12:26 PM

## 2010-07-21 ENCOUNTER — Emergency Department (HOSPITAL_COMMUNITY)
Admission: EM | Admit: 2010-07-21 | Discharge: 2010-07-21 | Disposition: A | Discharge status: Home / Self Care | Payer: Medicare Other | Attending: Emergency Medicine | Admitting: Emergency Medicine

## 2010-07-21 ENCOUNTER — Emergency Department (HOSPITAL_COMMUNITY): Payer: Medicare Other

## 2010-07-21 DIAGNOSIS — J40 Bronchitis, not specified as acute or chronic: Secondary | ICD-10-CM | POA: Insufficient documentation

## 2010-07-21 DIAGNOSIS — R079 Chest pain, unspecified: Secondary | ICD-10-CM | POA: Insufficient documentation

## 2010-07-21 DIAGNOSIS — I10 Essential (primary) hypertension: Secondary | ICD-10-CM | POA: Insufficient documentation

## 2010-07-21 LAB — D-DIMER, QUANTITATIVE: D-Dimer, Quant: <0.22 ug/mL-FEU (ref 0.00–0.48)

## 2010-07-21 LAB — BASIC METABOLIC PANEL
BUN: 16 mg/dL (ref 6–23)
CO2: 27 mEq/L (ref 19–32)
Calcium: 8.8 mg/dL (ref 8.4–10.5)
Chloride: 102 mEq/L (ref 96–112)
Creatinine, Ser: 0.73 mg/dL (ref 0.4–1.5)
GFR calc Af Amer: >60 mL/min (ref >60)
GFR calc non Af Amer: >60 mL/min (ref >60)
Glucose, Bld: 221 mg/dL — ABNORMAL HIGH (ref 70–99)
Potassium: 3.4 mEq/L — ABNORMAL LOW (ref 3.5–5.1)
Sodium: 139 mEq/L (ref 135–145)

## 2010-07-21 LAB — CK TOTAL AND CKMB (NOT AT ARMC)
CK, MB: 1.5 ng/mL (ref 0.3–4.0)
CK, MB: 1.4 ng/mL (ref 0.3–4.0)
Relative Index: INVALID (ref 0.0–2.5)
Relative Index: INVALID (ref 0.0–2.5)
Total CK: 89 U/L (ref 7–232)
Total CK: 79 U/L (ref 7–232)

## 2010-07-21 LAB — TROPONIN I
Troponin I: 0.01 ng/mL (ref 0.00–0.06)
Troponin I: 0.01 ng/mL (ref 0.00–0.06)

## 2010-07-21 LAB — CBC
HCT: 39.8 % (ref 39.0–52.0)
Hemoglobin: 13.5 g/dL (ref 13.0–17.0)
MCH: 31.8 pg (ref 26.0–34.0)
MCHC: 33.9 g/dL (ref 30.0–36.0)
MCV: 93.9 fL (ref 78.0–100.0)
Platelets: 154 10*3/uL (ref 150–400)
RBC: 4.24 MIL/uL (ref 4.22–5.81)
RDW: 12.5 % (ref 11.5–15.5)
WBC: 6.2 10*3/uL (ref 4.0–10.5)

## 2010-07-21 LAB — DIFFERENTIAL
Basophils Absolute: 0.1 10*3/uL (ref 0.0–0.1)
Basophils Relative: 1 % (ref 0–1)
Eosinophils Absolute: 0.1 10*3/uL (ref 0.0–0.7)
Eosinophils Relative: 2 % (ref 0–5)
Lymphocytes Relative: 32 % (ref 12–46)
Lymphs Abs: 2 10*3/uL (ref 0.7–4.0)
Monocytes Absolute: 0.5 10*3/uL (ref 0.1–1.0)
Monocytes Relative: 8 % (ref 3–12)
Neutro Abs: 3.5 10*3/uL (ref 1.7–7.7)
Neutrophils Relative %: 57 % (ref 43–77)

## 2010-07-25 ENCOUNTER — Ambulatory Visit (INDEPENDENT_AMBULATORY_CARE_PROVIDER_SITE_OTHER): Payer: Medicare Other | Admitting: Family Medicine

## 2010-07-25 ENCOUNTER — Encounter: Payer: Self-pay | Admitting: Family Medicine

## 2010-07-25 VITALS — BP 120/84 | Temp 98.6°F | Ht 72.0 in | Wt 236.0 lb

## 2010-07-25 DIAGNOSIS — J45901 Unspecified asthma with (acute) exacerbation: Secondary | ICD-10-CM

## 2010-07-25 MED ORDER — PREDNISONE 20 MG PO TABS: ORAL_TABLET | ORAL | Status: DC

## 2010-07-25 NOTE — Progress Notes (Signed)
  Subjective:    Patient ID: [name], male    DOB: May 09, 1941, 70 y.o.   MRN: [id number]  HPI [name] is a 70 year old, married male, nonsmoker, who has a history of allergic rhinitis in and asthma who comes in today for follow-up of asthma.  He said two episodes in the past 3 months with episodes resolve with a short course of prednisone.  He recently went to the emergency room for evaluation of some chest discomfort.  Was told he had asthma was given in an inhaler take two puffs twice daily and told to follow-up with [place].  Review of systems otherwise negative   Review of Systems    Negative Objective:   Physical Exam    Well-developed well-nourished, male in no acute distress.  HEENT negative.  Neck was supple.  No adenopathy.  Lungs she normal breath sounds very late expiratory wheezing    Assessment & Plan:  Asthma restart prednisone burst and taper return in 4 weeks for follow-up

## 2010-07-25 NOTE — Patient Instructions (Signed)
Restart the oral prednisone by taking two tabs x 3 days, a tablet a half x 3 days, one tab x 3 days, a half a tablet x 3 days, then half a tablet Monday, Wednesday, Friday, for a 4-week taper.  Return in 4 weeks for follow-up.  Stop the inhaler

## 2010-07-27 IMAGING — CR DG HAND COMPLETE 3+V*L*
3 series · 3 of 3 positions shown · non-contrast
Comparison: None

CLINICAL DATA: History given of injury with pain and stiffness
involving the second and third fingers.

LEFT HAND - COMPLETE 3+ VIEW

[view not recorded (1 of 3)]
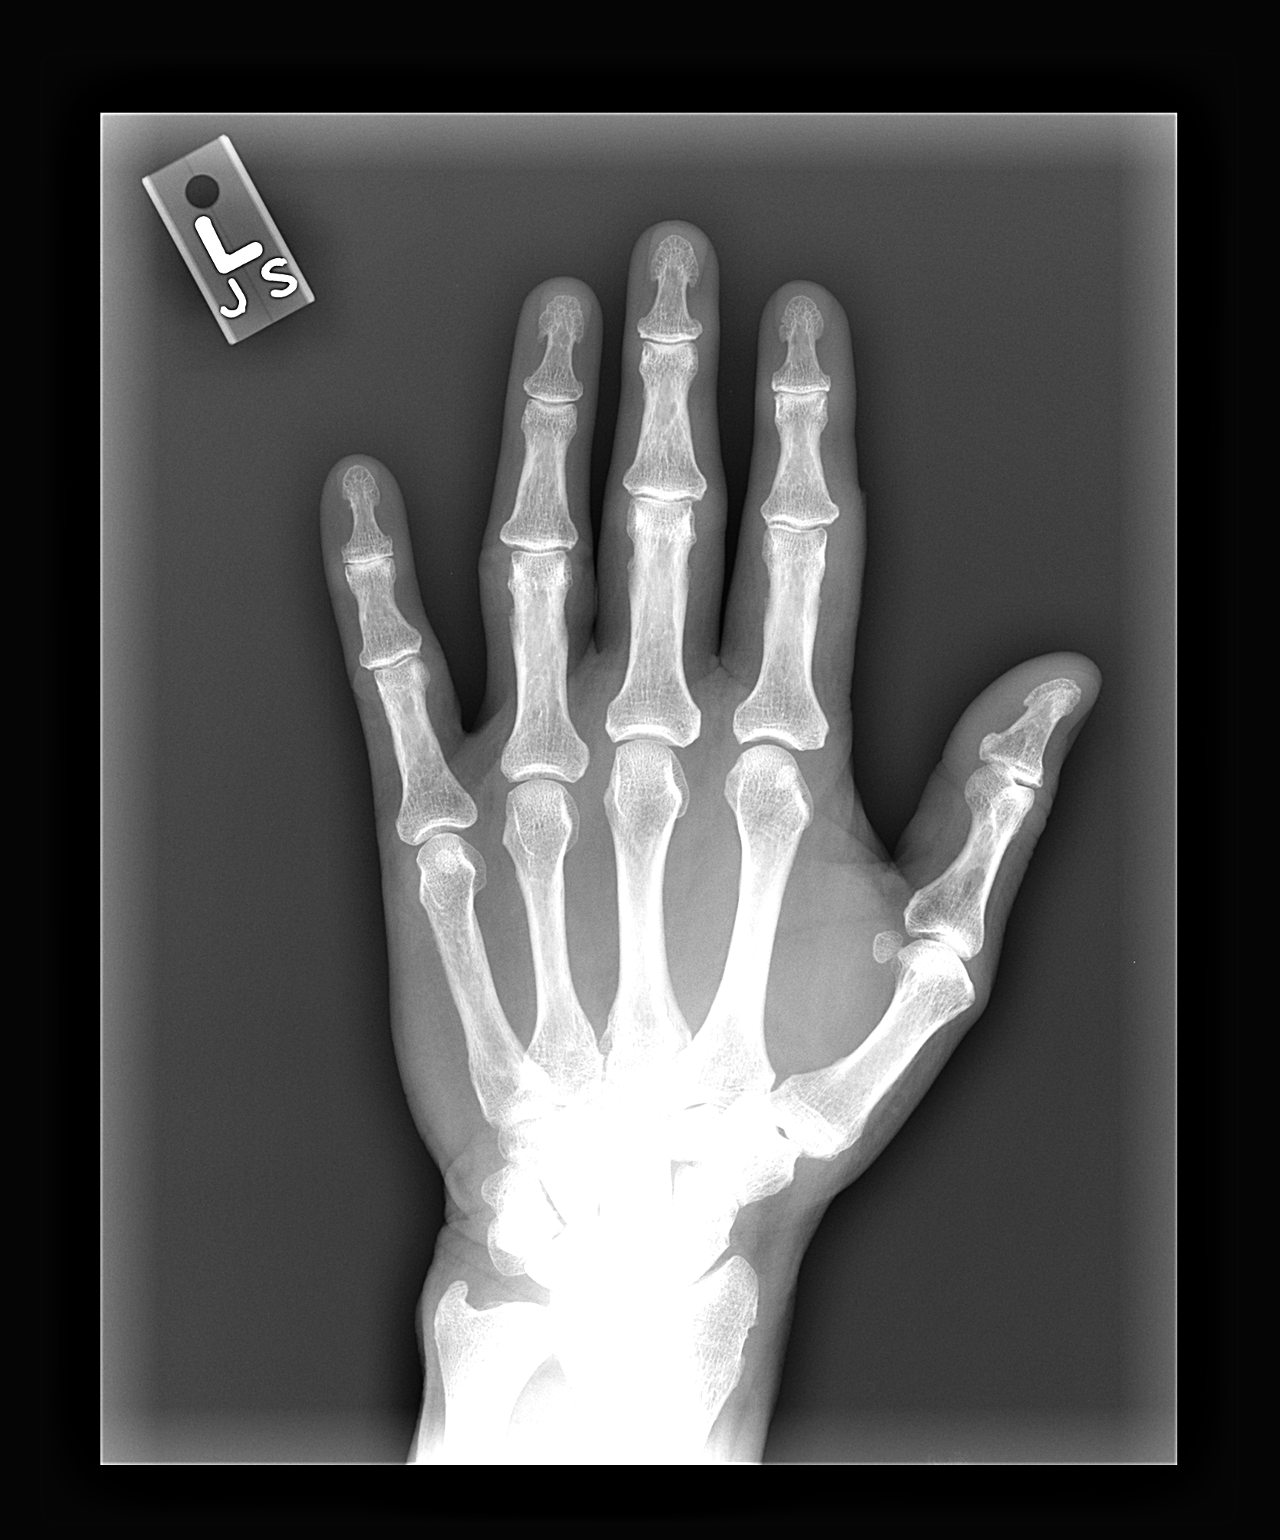

[view not recorded (2 of 3)]
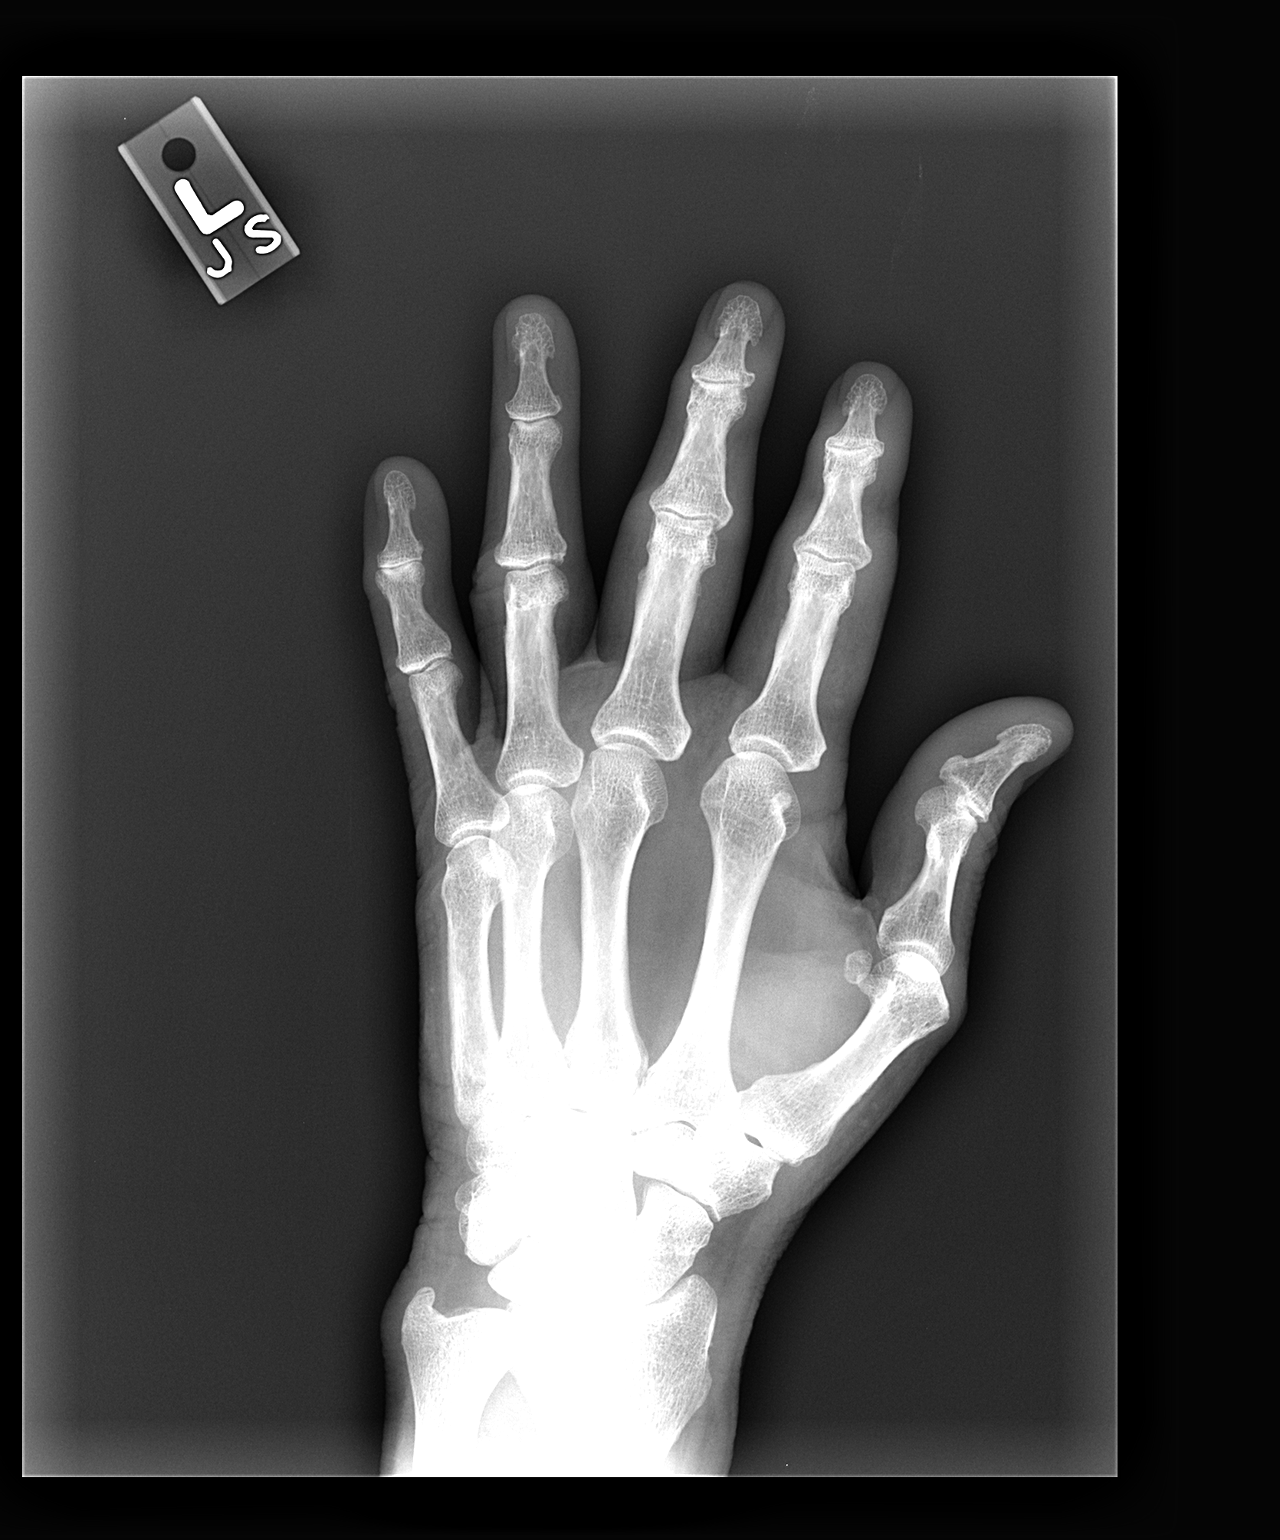

[view not recorded (3 of 3)]
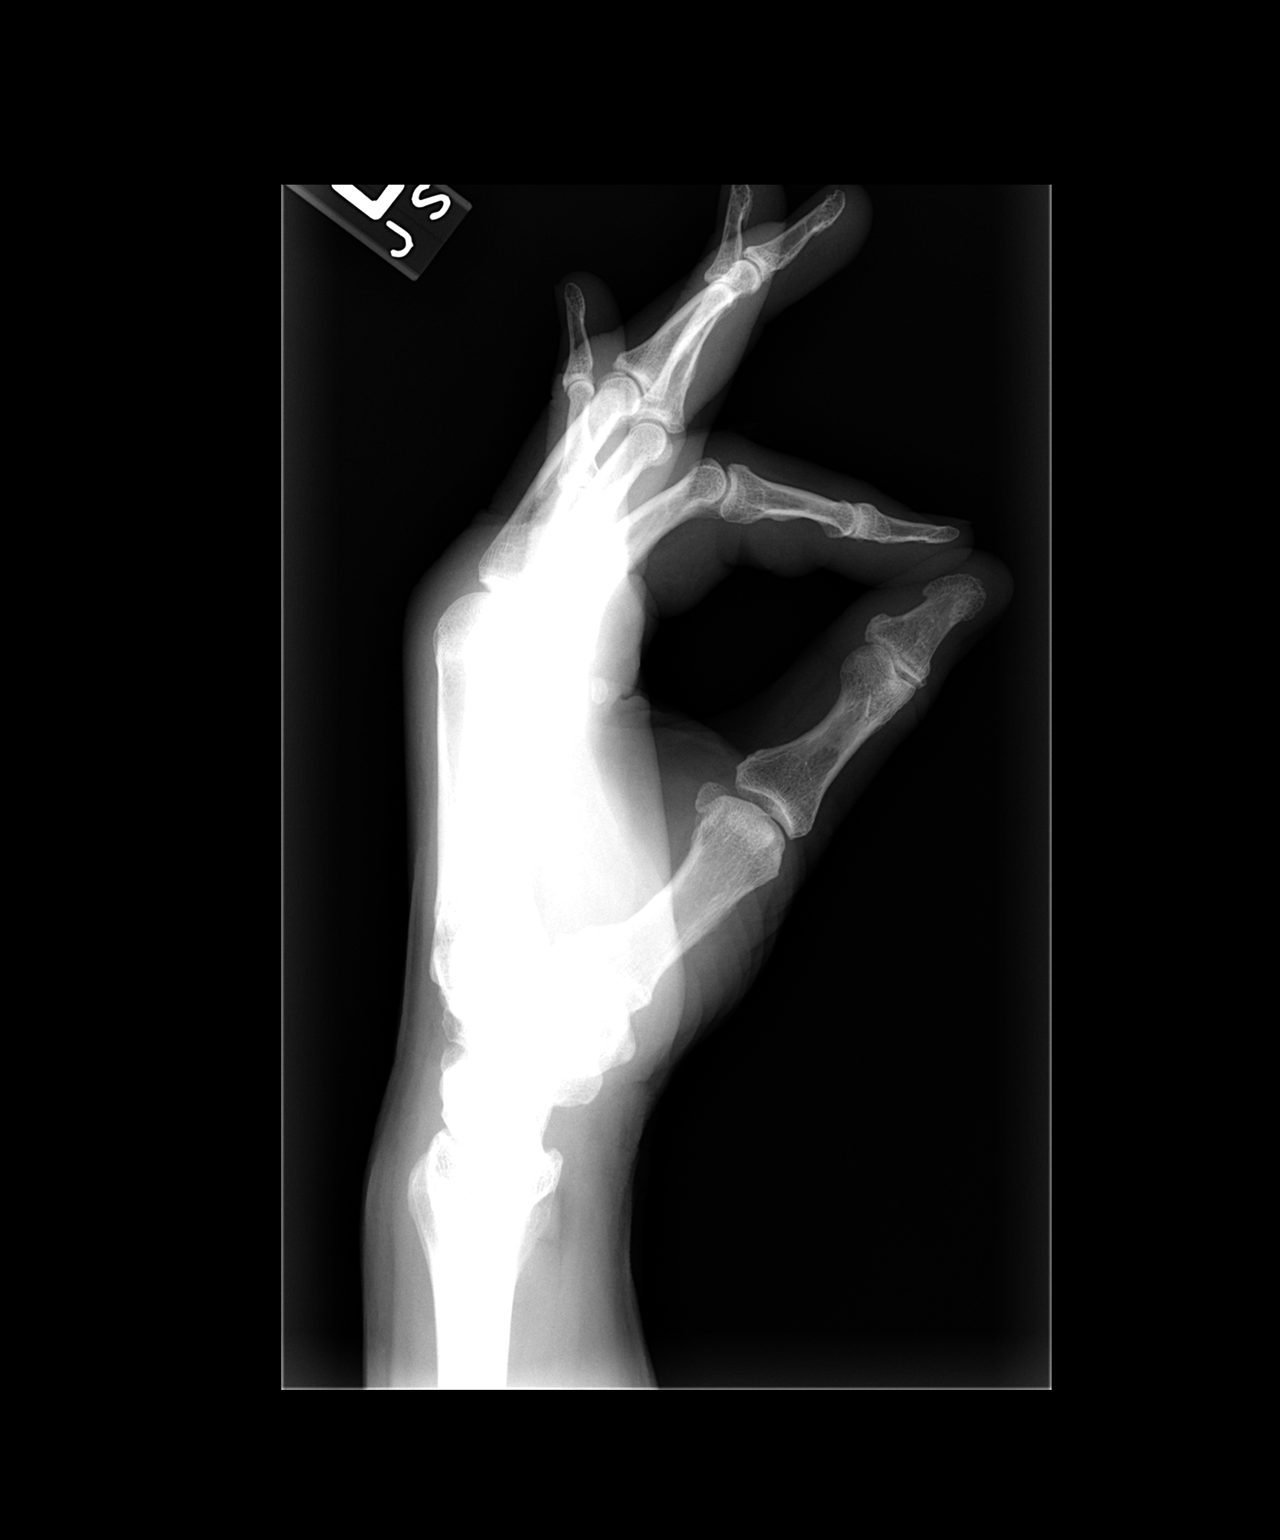

[3 of 3 positions shown; findings below may reference images not displayed]

FINDINGS: Alignment is normal.  No fracture or dislocation is
evident.  Minimal osteoarthritic change is seen involving the DIP
joint of the fifth finger.
IMPRESSION: No fracture is evident.

## 2010-08-23 ENCOUNTER — Ambulatory Visit: Payer: PRIVATE HEALTH INSURANCE | Admitting: Family Medicine

## 2010-09-03 LAB — DIFFERENTIAL
Basophils Absolute: 0 10*3/uL (ref 0.0–0.1)
Basophils Absolute: 0.1 10*3/uL (ref 0.0–0.1)
Basophils Relative: 0 % (ref 0–1)
Basophils Relative: 0 % (ref 0–1)
Eosinophils Absolute: 0 10*3/uL (ref 0.0–0.7)
Eosinophils Absolute: 0 10*3/uL (ref 0.0–0.7)
Eosinophils Relative: 0 % (ref 0–5)
Eosinophils Relative: 0 % (ref 0–5)
Lymphocytes Relative: 4 % — ABNORMAL LOW (ref 12–46)
Lymphocytes Relative: 7 % — ABNORMAL LOW (ref 12–46)
Lymphs Abs: 0.6 10*3/uL — ABNORMAL LOW (ref 0.7–4.0)
Lymphs Abs: 1 10*3/uL (ref 0.7–4.0)
Monocytes Absolute: 0.9 10*3/uL (ref 0.1–1.0)
Monocytes Absolute: 0.9 10*3/uL (ref 0.1–1.0)
Monocytes Relative: 5 % (ref 3–12)
Monocytes Relative: 6 % (ref 3–12)
Neutro Abs: 12.2 10*3/uL — ABNORMAL HIGH (ref 1.7–7.7)
Neutro Abs: 16.9 10*3/uL — ABNORMAL HIGH (ref 1.7–7.7)
Neutrophils Relative %: 86 % — ABNORMAL HIGH (ref 43–77)
Neutrophils Relative %: 92 % — ABNORMAL HIGH (ref 43–77)

## 2010-09-03 LAB — CBC
HCT: 35.4 % — ABNORMAL LOW (ref 39.0–52.0)
HCT: 35.6 % — ABNORMAL LOW (ref 39.0–52.0)
HCT: 37 % — ABNORMAL LOW (ref 39.0–52.0)
HCT: 41 % (ref 39.0–52.0)
Hemoglobin: 12.3 g/dL — ABNORMAL LOW (ref 13.0–17.0)
Hemoglobin: 12.4 g/dL — ABNORMAL LOW (ref 13.0–17.0)
Hemoglobin: 12.7 g/dL — ABNORMAL LOW (ref 13.0–17.0)
Hemoglobin: 14.1 g/dL (ref 13.0–17.0)
MCHC: 34.5 g/dL (ref 30.0–36.0)
MCHC: 34.5 g/dL (ref 30.0–36.0)
MCHC: 34.8 g/dL (ref 30.0–36.0)
MCHC: 34.8 g/dL (ref 30.0–36.0)
MCV: 93.4 fL (ref 78.0–100.0)
MCV: 93.8 fL (ref 78.0–100.0)
MCV: 94.1 fL (ref 78.0–100.0)
MCV: 94.5 fL (ref 78.0–100.0)
Platelets: 157 10*3/uL (ref 150–400)
Platelets: 168 10*3/uL (ref 150–400)
Platelets: 172 10*3/uL (ref 150–400)
Platelets: 193 10*3/uL (ref 150–400)
RBC: 3.79 MIL/uL — ABNORMAL LOW (ref 4.22–5.81)
RBC: 3.79 MIL/uL — ABNORMAL LOW (ref 4.22–5.81)
RBC: 3.91 MIL/uL — ABNORMAL LOW (ref 4.22–5.81)
RBC: 4.37 MIL/uL (ref 4.22–5.81)
RDW: 12.1 % (ref 11.5–15.5)
RDW: 12.3 % (ref 11.5–15.5)
RDW: 12.4 % (ref 11.5–15.5)
RDW: 12.4 % (ref 11.5–15.5)
WBC: 14.2 10*3/uL — ABNORMAL HIGH (ref 4.0–10.5)
WBC: 18.5 10*3/uL — ABNORMAL HIGH (ref 4.0–10.5)
WBC: 5.7 10*3/uL (ref 4.0–10.5)
WBC: 8.4 10*3/uL (ref 4.0–10.5)

## 2010-09-03 LAB — HEPATIC FUNCTION PANEL
ALT: 19 U/L (ref 0–53)
AST: 18 U/L (ref 0–37)
Albumin: 3.2 g/dL — ABNORMAL LOW (ref 3.5–5.2)
Alkaline Phosphatase: 44 U/L (ref 39–117)
Bilirubin, Direct: 0.2 mg/dL (ref 0.0–0.3)
Indirect Bilirubin: 0.8 mg/dL (ref 0.3–0.9)
Total Bilirubin: 1 mg/dL (ref 0.3–1.2)
Total Protein: 6.1 g/dL (ref 6.0–8.3)

## 2010-09-03 LAB — BASIC METABOLIC PANEL
BUN: 10 mg/dL (ref 6–23)
BUN: 15 mg/dL (ref 6–23)
BUN: 16 mg/dL (ref 6–23)
BUN: 7 mg/dL (ref 6–23)
CO2: 26 mEq/L (ref 19–32)
CO2: 26 mEq/L (ref 19–32)
CO2: 27 mEq/L (ref 19–32)
CO2: 31 mEq/L (ref 19–32)
Calcium: 8.4 mg/dL (ref 8.4–10.5)
Calcium: 8.7 mg/dL (ref 8.4–10.5)
Calcium: 8.7 mg/dL (ref 8.4–10.5)
Calcium: 9.2 mg/dL (ref 8.4–10.5)
Chloride: 101 mEq/L (ref 96–112)
Chloride: 102 mEq/L (ref 96–112)
Chloride: 103 mEq/L (ref 96–112)
Chloride: 106 mEq/L (ref 96–112)
Creatinine, Ser: 0.72 mg/dL (ref 0.4–1.5)
Creatinine, Ser: 0.81 mg/dL (ref 0.4–1.5)
Creatinine, Ser: 0.86 mg/dL (ref 0.4–1.5)
Creatinine, Ser: 0.86 mg/dL (ref 0.4–1.5)
GFR calc Af Amer: >60 mL/min (ref >60)
GFR calc Af Amer: >60 mL/min (ref >60)
GFR calc Af Amer: >60 mL/min (ref >60)
GFR calc Af Amer: >60 mL/min (ref >60)
GFR calc non Af Amer: >60 mL/min (ref >60)
GFR calc non Af Amer: >60 mL/min (ref >60)
GFR calc non Af Amer: >60 mL/min (ref >60)
GFR calc non Af Amer: >60 mL/min (ref >60)
Glucose, Bld: 111 mg/dL — ABNORMAL HIGH (ref 70–99)
Glucose, Bld: 127 mg/dL — ABNORMAL HIGH (ref 70–99)
Glucose, Bld: 143 mg/dL — ABNORMAL HIGH (ref 70–99)
Glucose, Bld: 165 mg/dL — ABNORMAL HIGH (ref 70–99)
Potassium: 3.2 mEq/L — ABNORMAL LOW (ref 3.5–5.1)
Potassium: 3.4 mEq/L — ABNORMAL LOW (ref 3.5–5.1)
Potassium: 4 mEq/L (ref 3.5–5.1)
Potassium: 4.4 mEq/L (ref 3.5–5.1)
Sodium: 137 mEq/L (ref 135–145)
Sodium: 138 mEq/L (ref 135–145)
Sodium: 138 mEq/L (ref 135–145)
Sodium: 141 mEq/L (ref 135–145)

## 2010-09-03 LAB — LIPID PANEL
Cholesterol: 109 mg/dL (ref 0–200)
HDL: 32 mg/dL — ABNORMAL LOW (ref >39)
LDL Cholesterol: 56 mg/dL (ref 0–99)
Total CHOL/HDL Ratio: 3.4 RATIO
Triglycerides: 105 mg/dL (ref <150)
VLDL: 21 mg/dL (ref 0–40)

## 2010-09-03 LAB — CARDIAC PANEL(CRET KIN+CKTOT+MB+TROPI)
CK, MB: 0.6 ng/mL (ref 0.3–4.0)
Relative Index: INVALID (ref 0.0–2.5)
Total CK: 40 U/L (ref 7–232)
Troponin I: 0.01 ng/mL (ref 0.00–0.06)

## 2010-09-03 LAB — CULTURE, BLOOD (ROUTINE X 2)
Culture: NO GROWTH
Culture: NO GROWTH

## 2010-09-03 LAB — MAGNESIUM
Magnesium: 1.6 mg/dL (ref 1.5–2.5)
Magnesium: 2.3 mg/dL (ref 1.5–2.5)

## 2010-09-03 LAB — TSH: TSH: 1.764 u[IU]/mL (ref 0.350–4.500)

## 2010-09-03 LAB — PROTIME-INR
INR: 1.5 (ref 0.00–1.49)
Prothrombin Time: 18.4 seconds — ABNORMAL HIGH (ref 11.6–15.2)

## 2010-09-03 LAB — APTT: aPTT: 36 seconds (ref 24–37)

## 2010-10-10 NOTE — Assessment & Plan Note (Signed)
OFFICE VISIT   [name], [name]  DOB:  02/06/1941                                       01/10/2009  [id number]#:[id number]   REASON FOR VISIT:  Followup.   HISTORY:  This is a 39 gentleman that I saw as an inpatient at [facility].  The patient had been admitted via the emergency room for a high  grade fever and elevated white count. He was also found to have  worsening redness and edema in his left leg.  He did have an open wound  on the plantar aspect of his left foot that he says had been present for  many years, that heals intermittently. The patient did improve with IV  antibiotics.  There was concern about venous insufficiency as a possible  etiology.  Reflux examination could not be done at the hospital and  therefore he is having it done in the office.  The patient has been off  antibiotics for several days. His foot is improving.  There is no  swelling and this wound is closing up.   PHYSICAL EXAMINATION:  Blood pressure 121/74, pulse is 42.  He is well-  appearing in no distress.  Cardiovascular:  Regular rate and rhythm.  Extremities:  Warm and well-perfused.  He has palpable pedal pulses.  He  has a Charcot foot with nonhealing wound on the plantar surface.  There  is minimal edema.  There is no erythema.   DIAGNOSTIC STUDIES:  Venous ultrasound was performed today.  No deep  pain or superficial vein thrombosis was identified.  There is only mild  reflux in the left common femoral popliteal and greater saphenous vein.   ASSESSMENT/PLAN:  Left leg wound.  The patient appropriately scheduled  to see Dr. Lajoyce Corners next week for discussion of management of his plantar  foot issues and Charcot foot.  I do not think the patient, based on  duplex ultrasound, has significant reflux that would benefit from  intervention.  In addition, the patient noticed, now that his infection  has resolved, is not having any issues with his edema.  If he does have  problems  with swelling in the future I would recommend compression  therapy as I do not think he would be a good candidate for laser  ablation as he would not receive much benefit.   I will plan on seeing the patient back on a p.r.n. basis.   Jorge Ny, MD  Electronically Signed   VWB/MEDQ  D:  01/10/2009  T:  01/11/2009  Job:  [id number]   cc:   Tinnie Gens A. Tawanna Cooler, MD  Nadara Mustard, MD

## 2010-10-10 NOTE — H&P (Signed)
NAMEARISTIDE, [name]                 ACCOUNT NO.:  [id number]   MEDICAL RECORD NO.:  [id number]          PATIENT TYPE:  INP   LOCATION:  0101                         FACILITY:  [facility]   PHYSICIAN:  Lucile Crater, MD         DATE OF BIRTH:  19-Jul-1940   DATE OF ADMISSION:  12/19/2008  DATE OF DISCHARGE:                              HISTORY & PHYSICAL   PRIMARY CARE PHYSICIAN:  Tinnie Gens A. Tawanna Cooler, M.D.   CHIEF COMPLAINT:  Fever, left leg pain and erythema x1 day.   HISTORY OF PRESENT ILLNESS:  [name] is a 70 year old male with a  history of hypertension who had a callus on the left foot for over a  year.  He has been seeing a podiatrist for this.  There has been ulcer  in the center of the callus which has been draining on and off for the  past one year.  He was treated with antibiotics for more than a year  ago.  During this year he has not been febrile.  Yesterday he noticed  that his left foot started hurting and there is erythema extending up to  his knees.  He also had a T-max of 104 degrees at home.  The vitals in  the emergency room revealed a temperature of 101.3 degrees Fahrenheit  with an elevated white count.  He denies having any other complaints.  There is no cough, no burning micturition.  He has a mild headache now.   REVIEW OF SYSTEMS:  A complete review of systems was done which included  general, head, eyes, ears, nose, throat, cardiovascular, respiratory,  GI, GU, endocrine, musculoskeletal, skin, neurologic, and psychiatric.  All are within normal limits other than what is mentioned above.   PAST MEDICAL HISTORY:  1. Hypertension.  2. Left heart catheterization which revealed no critical stenosis in      2008.  3. Hypokalemia.  4. Cholecystectomy.  5. Hernia repair in 1960s.   ALLERGIES:  No true allergies.  He is intolerant to PENICILLIN which  causes him nausea.   CURRENT MEDICATIONS AT HOME:  1. Atenolol 25 mg once a day.  2. Potassium chloride, unknown  dose.   SOCIAL HISTORY:  No history of tobacco abuse.  No alcohol or illicit  drugs.   FAMILY HISTORY:  Noncontributory.   PHYSICAL EXAMINATION:  VITAL SIGNS:  T-max 101.3, pulse rate 106,  respiratory rate 18, blood pressure 122/66.  O2 saturations 94% on room  air.  GENERAL APPEARANCE:  Not in any acute distress.  Alert, awake, oriented  x3.  Febrile.  HEENT:  Normocephalic, atraumatic.  The pupils are equal and react to  light and accommodation.  Extraocular muscles are intact.  The mucous  membranes are moist.  NECK:  Supple.  No JVD, lymphadenopathy, or carotid bruit.  CARDIOVASCULAR:  Regular rhythm.  Rate is normal.  No murmurs, rubs, or  gallops.  LUNGS:  Clear to auscultation bilaterally.  EXTREMITIES:  No clubbing, cyanosis, or edema.  Left lower extremity  shows diffuse erythema extending up to the knee.  It  is warm to touch.  There is a callus on the left foot with a central ulcer draining  purulent material which is foul-smelling.  NEUROLOGIC:  Grossly nonfocal.   LABORATORY DATA:  1. WBC 18,500, hemoglobin 14, hematocrit 41, platelets 193.      Neutrophils 92, lymphocytes, 4%.  Sodium 138, potassium 3.4,      chloride 101, bicarb 26, BUN 15, creatinine 0.86, glucose 165.   ASSESSMENT/PLAN:  1. Left lower extremity cellulitis.  The patient never had history of      cellulitis in the past but he does have a chronic draining ulcer on      the foot.  Will check an x-ray to evaluate if he has any      osteomyelitis.  If the x-ray is negative or inconclusive we will      proceed with an MRI of the left foot.  For the cellulitis we will      start him on empiric antibiotics with vancomycin and Cefazolin.      Will also obtain blood cultures given his leukocytosis and      temperature of 101.3.  If needed we will do a biopsy of the bone if      the bone is involved for culture.  We can tailor the antibiotics      based on the culture reports.  2. Hypertension.  At goal.   We will continue atenolol.  3. DVT prophylaxis with unfractionated heparin.  4. Fluids and nutrition.  Will start him on normal saline 75 ml an      hour.  Will replace his potassium.  He will be started on an AHA      diet.      Lucile Crater, MD  Electronically Signed     TA/MEDQ  D:  12/19/2008  T:  12/20/2008  Job:  [id number]

## 2010-10-10 NOTE — Procedures (Signed)
DUPLEX DEEP VENOUS EXAM - LOWER EXTREMITY   INDICATION:  Rule out left leg venous reflux, left leg wound.   HISTORY:  Edema:  No.  Trauma/Surgery:  No.  Pain:  No.  PE:  No.  Previous DVT:  No.  Anticoagulants:  Other:  Wound on left foot.   DUPLEX EXAM:                CFV   SFV   PopV  PTV    GSV                R  L  R  L  R  L  R   L  R  L  Thrombosis    o  o     o     o      o     o  Spontaneous   +  +     +     +      +     +  Phasic        +  +     +     +      +     +  Augmentation  +  +     +     +      +     +  Compressible  +  +     +     +      +     +  Competent     +  0     +     0            0   Legend:  + - yes  o - no  p - partial  D - decreased   IMPRESSION:  1. No evidence of deep venous thrombosis noted in the left lower      extremity deep.  2. Mild reflux is noted in the left common femoral vein, popliteal,      and greater saphenous veins.    _____________________________  V. Charlena Cross, MD   CH/MEDQ  D:  01/11/2009  T:  01/11/2009  Job:  [id number]

## 2010-10-10 NOTE — Discharge Summary (Signed)
[name], [name]                 ACCOUNT NO.:  [id number]   MEDICAL RECORD NO.:  [id number]          PATIENT TYPE:  INP   LOCATION:  1522                         FACILITY:  [facility]   PHYSICIAN:  Sanda Linger, MD       DATE OF BIRTH:  12-18-1940   DATE OF ADMISSION:  12/19/2008  DATE OF DISCHARGE:  12/24/2008                               DISCHARGE SUMMARY   PRIMARY CARE PHYSICIAN:  Dr. Alonza Smoker.   FOOT SURGEON:  Dr. Lestine Box.   DISCHARGE DIAGNOSES:  1. Venous insufficiency in the left lower extremity.  2. Cellulitis in the left lower extremity improved after oral      antibiotics.  3. Callus in his left side which is draining and appears infected.      This has improved.  4. Charcot feet.  5. History of hypertension.  6. Hypokalemia during this admission.   BRIEF HISTORY OF PRESENT ILLNESS:  This is a 70 year old male who has  been treated for a callus on his left foot off and on for the past year.  He came to the emergency room with a temperature up to 104 degrees.  He  complained of worsening redness and swelling beginning in the plantar  surface of the left foot extending up the left lower extremity.  In the  emergency room he had a temperature of 101.3.  It was felt that he had  cellulitis and the source being an ulcer/callus on the bottom of his  left foot.  He was admitted for IV antibiotics.  Admission labs showed a  white cell count of 18.5 with a differential that showed a slight shift  to the left. Hemoglobin/hematocrit and platelet count were normal.  His  basic metabolic panel showed a low potassium at 3.4, high glucose at  165.  Protime was normal at 1.5.  His fasting lipid panel was within  normal limits.  His TSH was normal at 1.764.  He had 2 sets of blood  cultures done that were normal.  No cultures were done of the exudate on  the plantar surface of the left foot.   HOSPITAL COURSE:  He was admitted as an inpatient, and his initial  antibiotic regimen was  Ancef and vancomycin.  After 2 days of that  regimen he was not improving much, so the Ancef was changed to Zosyn and  the vancomycin was continued.  On his first full hospital day it was  noted that his white cell count decreased to 14.2, but his potassium  level also decreased to 3.2.  His magnesium was borderline low at 1.6.  He had had plain films done of the left foot which were unremarkable,  but it was recommended that he have an MRI done to make sure there was  not any evidence of osteomyelitis.  His low potassium and magnesium were  replaced orally.  He did eventually undergo an MRI of the left foot  which showed chronic changes, but there was no evidence of  osteomyelitis.  On hospital day #2 since he was not improving, the Ancef  was changed to IV Zosyn.  While he was in the hospital, he and his  family members expressed concern about the persistent area of swelling  in his left lower leg.  He has an area on the left lower leg that  appears distinct and separate from the process on the plantar surface of  the left foot.  There is a large very distinct area of erythema, post  inflammatory  hyperpigmentation, induration, swelling, and warmth that  extends from the lateral aspect of the left knee over the left lower leg  and pretty much ends abruptly at the ankle.  This appears to be separate  and distinct from the process on the left foot which is a callus on the  arch of the foot with an ulcer that  has some draining.  Due to the  concern about this area and the patient's report that he has had  previous infections there and some comment about radiation therapy, it  was my concern that he might have some venous insufficiency.  I  contacted Dr. Myra Gianotti of Vascular Surgery.  He saw the patient in the  hospital, and recommended that he would need an outpatient workup for  possible venous reflex.  Dr. Myra Gianotti has been kind enough to arrange  that as an outpatient.  He also  recommended that the patient see Dr.  Lajoyce Corners to evaluate the ulcer.  This will also be arranged as an  outpatient.  So after 3 days of IV Zosyn and vancomycin, the patient was  remarkably improved.  He reported less pain, redness, and swelling on  the bottom of the left foot.  There was less drainage on the dressings.  One day prior to discharge his antibiotic regimen was changed to oral  Ceftin and Bactrim.   The examination of the left foot continues to show improvement.  He has  chronic changes in the mid metatarsal region, but on the plantar surface  he has a callus with an ulcer.  It has very minimal drainage at this  time.  The erythema, induration, and warmth with tenderness to palpation  in the left lower leg has markedly diminished.  His labs at the time of  discharge show magnesium of 2.3.  Basic metabolic panel shows a slightly  elevated glucose at 143.  His CBC is within normal limits with the  exception of a mild anemia, but his white cell count is down to 5.7.   PROCEDURES DONE THIS ADMISSION:  The plain films of the left foot and  the MRI of the left foot which showed the chronic changes, but no  evidence of osteomyelitis.  No vascular procedures were done at the  recommendation of Dr. Myra Gianotti as he states that the vascular  technologists here do not have the training to evaluate for venous  insufficiency.   PHYSICAL EXAMINATION AT TIME OF DISCHARGE:  Shows an alert, pleasant,  ambulatory male with a temperature of 97.8, pulse is 61, respiratory  rate is 20, blood pressure is 111/78, pulse ox is 98% and that is  normal.  Examination of the left foot showed that the callus is still present.  The ulcer is healing.  There is no exudate.  There is no surrounding  swelling any more on the plantar surface of the left foot.  He continues  to have 1+ pulses in bilateral dorsalis pedis, posterior tibial.  In the  left lower extremity there is a marked decrease in the erythema,  edema,  and swelling on the left lower leg.  It again ends abruptly at the  ankle.   DISCHARGE MEDICATIONS:  1. Atenolol 25 mg once a day.  2. Ceftin 500 mg twice a day for 14 days.  3. Loratadine 10 mg once a day as needed.  4. Trimethoprim-sulfamethoxazole 1 twice a day for 14 days.  5. Acetaminophen as needed.   FOLLOW-UP:  Will be with his primary care physician, Dr. Alonza Smoker, in  approximately 2 weeks.  At that time follow-up on the hyperglycemia as  well as the electrolyte abnormalities.  He will have an outpatient visit  with the vascular surgery group for evaluation of venous reflux or  venous insufficiency.  He will arrange to see Dr. Lajoyce Corners for an evaluation  of the ulcer/callus.  He will continue to follow up with Dr. Lestine Box,  his foot surgeon, and he will  contemplate having reconstructive surgery in the feet for the Charcot  disorder.  He will notify us sooner if there are any new or worsening  symptoms.  Greater than 30 minutes was spent on this patient's  discharge, the dictation, paperwork, and prescriptions.      Sanda Linger, MD  Electronically Signed     TJ/MEDQ  D:  12/24/2008  T:  12/24/2008  Job:  [id number]   cc:   Tinnie Gens A. Tawanna Cooler, MD  [address] [place]  [place] [address]   Lestine Box, M.D.

## 2010-10-13 NOTE — Discharge Summary (Signed)
[name], [name]                 ACCOUNT NO.:  [id number]   MEDICAL RECORD NO.:  [id number]          PATIENT TYPE:  INP   LOCATION:  3712                         FACILITY:  [facility]   PHYSICIAN:  Valerie A. Felicity Roberts, MDDATE OF BIRTH:  [birth year]   DATE OF ADMISSION:  06/15/2006  DATE OF DISCHARGE:  06/18/2006                               DISCHARGE SUMMARY   DISCHARGE DIAGNOSES:  1. Chest pain status post cardiac catheterization, no critical      stenoses noted.  2. Hypertension.  3. Gastroesophageal reflux disease.  4. Hypokalemia.   HISTORY OF PRESENT ILLNESS:  [name] is a 70 year old male who was  admitted on June 15, 2006 with chief complaint of chest pain.  Due to  multiple risk factors, he was admitted and cycled for rule out  myocardial infarction.   PAST MEDICAL HISTORY:  1. Cholecystectomy.  2. Hernia repair in 1960s.  3. Hypertension.  4. Questionable GERD.   COURSE OF HOSPITALIZATION:  1. Chest pain.  The patient was admitted.  His cardiac enzymes were      negative.  A cardiac catheterization was pursued on June 17, 2006, was performed by Dr. Riley Kill.  The patient was noted to have      no critical stenoses of the LAD or the right coronary arteries.  It      was planned to continue medical management.  The patient will need      to have a follow-up fasting lipid profile performed as an      outpatient, to determine need for Statin.  He will be continued on      beta blocker and enteric-coated aspirin.  2. Hypertension.  The patient was continued on atenolol and was placed      on an ACE inhibitor in place of hydrochlorothiazide.  His blood      pressure was noted to be slightly low on June 18, 2006 with a      value of 97/67.  Repeat BP was 115/80.  His atenolol will be      decreased to 25 mg p.o. once daily.  He will be continued on      lisinopril 20 mg p.o. daily.  He will need followup of his blood      pressure an outpatient.  3. The  patient states he often has symptoms of reflux and takes an      Catering manager with aspirin at night.  I recommended to the patient      that he take an enteric coated baby aspirin and Protonix in place      of Alka-Seltzer.  He refuses Protonix, recommended Tums as needed      in place of Alka-Seltzer and follow up with Dr. Tawanna Cooler.  4. Hypokalemia.  The patient was on a potassium supplement prior to      admission, presumably because of his being on hydrochlorothiazide.      With the addition of ACE inhibitor, he is likely to need continued      potassium supplementation; however, this will  need to be repeated      as an outpatient and supplemented if need be.  His potassium on the      morning of discharge is 3.3.  He will be given 20 mEq of potassium      prior to discharge home.   PERTINENT LABORATORY DATA:  At time of discharge, hemoglobin 13.4,  hematocrit 38.3, BUN 10, creatinine 0.79, potassium 3.3.   MEDICATIONS AT DISCHARGE:  1. Atenolol 25 mg p.o. daily.  2. Lisinopril 20 mg p.o. daily.  3. Enteric-coated aspirin 81 mg p.o. daily.   FOLLOW UP:  The patient is instructed follow up with Dr. Tawanna Cooler in 1 week.  He will contact the office for an appointment.  He is instructed to call  Dr. Tawanna Cooler should he develop weakness, fever over 101, recurrent chest  pain or go directly to the emergency room.  He will need follow-up BMETs  to assess potassium levels at this visit, as well as followup of his  blood pressure.      Tyrone Craze, NP      Tyrone Rover. Felicity Coyer, MD  Electronically Signed    MO/MEDQ  D:  06/18/2006  T:  06/18/2006  Job:  [id number]   cc:   Tinnie Gens A. Tawanna Cooler, MD

## 2010-10-13 NOTE — H&P (Signed)
[name]                 ACCOUNT NO.:  [id number]   MEDICAL RECORD NO.:  [id number]          PATIENT TYPE:  INP   LOCATION:  3712                         FACILITY:  [facility]   PHYSICIAN:  Titus Dubin. Hopper, MD,FACP,FCCPDATE OF BIRTH:  1940-07-18   DATE OF ADMISSION:  06/15/2006  DATE OF DISCHARGE:                              HISTORY & PHYSICAL   [name] is a 70 year old white male with atypical chest pain  admitted for observation to rule out ischemic disease because of  multiple risk factors and family history.   He had some chest discomfort 2 days prior to admission.  The morning of  admission he had intermittent discomfort.  Both episodes occurred while  he was in a recliner 30-45 minutes after eating breakfast.  It began at  approximately 9:30 or 10:00 this morning, was described as a dull, left  lower chest or left upper quadrant pain without radiation.  He felt he  may have had some associated tachycardia with this.  He had no nausea or  diaphoresis.  The symptoms lasted approximately an hour.  He went to an  urgent care and was referred to the ER for evaluation.   He did walk 1.5 miles between 8:30 and 9:00 a.m. this morning with no  cardiopulmonary symptoms.  He states that he had previously walked this  distance approximately 5 times a week until recently without any  symptoms.   PAST MEDICAL HISTORY:  Includes cholecystectomy in 1983.  He had a  herniorrhaphy in 1960.  He was hospitalized with nausea, vomiting,  diarrhea, dehydration in 2003.   He has hypertension and is on three potassium chloride pills daily;he is  not sure whether it is 20 mEq or not.In view of the fact that he takes 3  pills, the dose is probably 10 mEq.  He is on atenolol/HCT 50/25, 1  daily.   He has not smoked for 25 years.  He has 2 alcoholic beverages a day.   HE HAS NO KNOWN DRUG ALLERGIES.   His father had a stroke, brother had myocardial infarction in his 55's.  Sister  had cancer of the lung and was a remote smoker.  There is no  family history of diabetes.   REVIEW OF SYSTEMS:  Reveals he has had recent upper respiratory tract  symptoms with some sinus pressure and some purulence.   He has had no associated shortness of breath.   He has had no paresthesias or weakness.   He describes occasional heart burn usually diet related.  Most recently  it was related to ingestion of barbecue.   He takes 2 Alka-Seltzer at bedtime.  He states that he was told  to take  aspirin, so he thought he waskilling two birds with one stone taking  the Alka-Seltzer in place of the aspirin.   He does have arthralgias at various locations, but denies taking any  other nonsteroidals.   He has no genito-urinary symptoms.   He states that his cholesterol has been normal with a total cholesterol  of approximately 150.  He has a  physical each year and states that his  sugar has been a little bit on the high side.  He said that Dr. Tawanna Cooler  is treating this with diet and exercise.   Skin:  He denies any dermatologic problems or other neurologic problems.   He is in no acute distress sitting in a chair watching the basketball  game.  Blood pressure is 124/79, O2 sats are 96% on room air,  respiratory rate is 14, and pulse is 76.  He has arterial narrowing.  There is a scaly dermatitis in the otic canals.  Nares are dry.  He has  an upper plate and lower partial.  He has erythema of the uvula and  posterior pharynx.   He has no carotid bruits.  Thyroid is normal to palpation.  He has a  regular rhythm, which is sinus on telemetry.   He has minimal rhonchi; chest is essentially clear.   Abdomen is nontender.   Genitourinary exams are deferred initially as they are not germane to  this admission and due to the fact that stool cards will be collected.   He moves all extremities and gets on and off the exam table without  difficulty despite his history of arthritis.  His  pedal pulses are  slightly decreased.  He has no edema.   There are no neurologic deficits, although his insight is somewhat  questionable in review of his statements and what sounds like suboptimal  control of his risk factors.   He will be admitted to telemetry with cardiac enzymes.  His  hydrochlorothiazide will be held because of the history of hypokalemia  and possible hyperglycemia.  A1c will be collected.  At this time his  potassium is 3.7.  If he indeed has an elevated A1c, perhaps an ACE  inhibitor or ARB may be indicated.   His random glucose was 160.  Hematocrit was 44.  Enzymes are negative  x1.   EKG reveals incomplete right bundle branch block with no ischemia.      Titus Dubin. Alwyn Ren, MD,FACP,FCCP  Electronically Signed     [facility]/MEDQ  D:  06/15/2006  T:  06/16/2006  Job:  [id number]   cc:   Tinnie Gens A. Tawanna Cooler, MD

## 2010-10-13 NOTE — Assessment & Plan Note (Signed)
[facility]                                 ON-CALL NOTE   LINDEN, TAGLIAFERRO                          MRN:          [id number]  DATE:06/15/2006                            DOB:          [birth year]    TIME OF CALL:  10:22 a.m.   PHONE NUMBER:  [phone].   CALLER:  [place].   Regular doctor is Dr. Tawanna Cooler, Dr. Milinda Antis on call.   [name] is calling to say that [name] is having some significant  chest pains, they need to know what to do.  We told her to take him to  the closest emergency room for evaluation now and this is what they are  going to do.     Marne A. Tower, MD  Electronically Signed    MAT/MedQ  DD: 06/15/2006  DT: 06/15/2006  Job #: [id number]   cc:   Tinnie Gens A. Tawanna Cooler, MD

## 2010-10-13 NOTE — Cardiovascular Report (Signed)
NAMENOELLE, HOOGLAND                 ACCOUNT NO.:  [id number]   MEDICAL RECORD NO.:  [id number]          PATIENT TYPE:  INP   LOCATION:  3712                         FACILITY:  [facility]   PHYSICIAN:  Arturo Morton. Riley Kill, MD, FACCDATE OF BIRTH:  [birth year]   DATE OF PROCEDURE:  06/17/2006  DATE OF DISCHARGE:                            CARDIAC CATHETERIZATION   INDICATIONS:  Mr. Bera is a 70 year old with chest pain.  He presents  with continued episodes of recurrent chest pain.  He was seen by Dr.  Jens Som, and subsequently he elected to recommend a cardiac  catheterization.  His enzymes were negative.  Permit has been obtained  by Dr. Jens Som after thorough discussion.   PROCEDURES:  1. Left heart catheterization.  2. Selective coronary arteriography.  3. Selective left ventriculography.   DESCRIPTION OF PROCEDURE:  The patient was brought to the  catheterization lab and prepped and draped in the usual fashion.  Through an anterior puncture, the right femoral artery was easily  entered, and a 5-French sheath was placed. Views of  left and right  coronary arteries were obtained in multiple angiographic projections.  Central aortic and left ventricular pressures were measured with  pigtail.  Ventriculography was done in the RAO projection.  I then  reviewed the pictures with the patient's family.  He was taken to the  holding area in satisfactory clinical condition.   HEMODYNAMIC DATA.:  1. Central aortic pressure 118/72, mean 92.  2. Left atrial pressure 117/9.  3. No gradient on pullback across aortic valve.   ANGIOGRAPHIC DATA:  1. On plain fluoroscopy, there is moderate calcification of the left      anterior descending artery.  2. Ventriculography in the RAO projection reveals preserved global      systolic function without segmental wall motion abnormality.  3. The left main is free of critical disease and short.  4. The LAD courses to the apex and has calcification.  There  is about      30% narrowing after takeoff of the major diagonal.  There is a fair      amount of luminal irregularity and fairly heavy calcification in      the mid portion of the LAD.  However, critical focal stenosis is      not noted.  5. The circumflex provides a first marginal and then provides a large      second marginal.  The first marginal has about 30% ostial      narrowing, but no high-grade narrowing.  6. The right coronary artery also has mild luminal irregularity.      There is probably 30% narrowing in the acute margin.  The PDA and      posterolateral system are without critical narrowing but have some      tapering to the distal vessels.   CONCLUSIONS:  1. Preserved overall left ventricular function.  2. No critical stenoses involving the left anterior descending,      circumflex or right coronary arteries as noted above.   DISPOSITION:  The patient will be treated medically.  Other sources  of  chest pain may be sought by Dr. Jens Som.      Arturo Morton. Riley Kill, MD, [facility]  Electronically Signed     TDS/MEDQ  D:  06/17/2006  T:  06/17/2006  Job:  [id number]   cc:   Madolyn Frieze. Jens Som, MD, [facility]  Titus Dubin. Alwyn Ren, MD,FACP,FCCP

## 2010-10-13 NOTE — Consult Note (Signed)
[name], [name]                 ACCOUNT NO.:  [id number]   MEDICAL RECORD NO.:  [id number]          PATIENT TYPE:  INP   LOCATION:  3712                         FACILITY:  [facility]   PHYSICIAN:  Audery Amel, MD    DATE OF BIRTH:  1940-10-08   DATE OF CONSULTATION:  06/15/2006  DATE OF DISCHARGE:                                 CONSULTATION   REASON FOR REQUEST:  Chest pain.   HISTORY OF PRESENT ILLNESS:  The patient is a 70 year old white male  with past medical history notable for hypertension, who was transferred  for further evaluation of chest pain.  The patient states that his  symptoms began this a.m.( approximately 9:30) after his morning wall.  On return home he ate breakfast, and then shortly afterwards developed  left-sided burning chest discomfort.  The pain did not radiate; however,  it was associated with some mild shortness of breath and diaphoresis.  He presented to the emergency department for evaluation, where he was  treated with nitroglycerin and noted a subsequent improvement in his  symptoms.  Since that time, the patient was asymptomatic until this  evening at approximately 2200, when we had the onset of similar  symptoms.  The patient also notes that he had and episode of discomfort  earlier in the week, which spontaneously resolved in 30 minutes.  Currently he is chest pain-free and otherwise without complaints.   PAST MEDICAL HISTORY:  1. Hypertension.  2. Status post cholecystectomy.  3. Status post inguinal hernia repair.   ALLERGIES:  NO KNOWN DRUG ALLERGIES.   CURRENT MEDICATIONS:  1. Atenolol 25 mg b.i.d.  2. __________ 50 mg p.o. q. day.  3. Lisinopril 20 mg q. day.  4. Protonix 40 mg q. day.  5. Nitroglycerin sublingual p.r.n.   SOCIAL HISTORY:  The patient lives in [place] with his wife.  He is  retired from the police force.  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  He has a remote history of smoking; quit 25 years  ago.  He denies any alcohol or illicit substances.   FAMILY HISTORY:  Positive for coronary artery disease in a brother at  the age of 54.  His brother survived his myocardial infarction and lived  into his 73s.  Otherwise, there is no CAD history.   REVIEW OF SYSTEMS:  CONSTITUTIONAL:  The patient denies any fevers,  chills, weight loss or adenopathy.  HEENT:  No headaches or throat  nodes.  No nasal discharge.  No change of visual or auditory acuity.  SKIN:  No rashes or lesions CARDIOPULMONARY:  As per HPI.  The patient  denies any PND, orthopnea, palpitations, presyncope or syncope.  There  is a questionable history of claudication.  GU:  No frequency, urgency  or dysuria.  NEUROLOGIC/PSYCHOLOGIC:  No weakness, numbness or mood  disturbance.  MUSCULOSKELETAL:  No myalgias, arthralgias, or joint  swelling deformity.  GI:  No nausea, vomiting, diarrhea, bright red  blood per rectum, melena or GERD symptoms.  ENDOCRINE:  No polyuria, no  polydipsia, no heat or  cold intolerance.  All other review of systems is  negative.   PHYSICAL EXAMINATION:  VITAL SIGNS:  Blood pressure 121/79, heart rate  97, O2 saturations 94% on room air.  GENERAL:  The patient is alert and oriented to his person, place and  time; in no acute distress and pleasantly conversant.  NECK:  Supple, full range of motion, no JVD.  His carotid upstrokes are  equal and symmetric bilaterally without audible bruit.  There is no  palpable thyromegaly.  Lymphadenopathy none.  CARDIOVASCULAR:  Normal S1, S2 without audible murmurs, rubs or gallops.  CHEST:  Clear to auscultation bilaterally.  ABDOMEN:  Obese, soft, nontender and nondistended.  Positive bowel  sounds.  No hepatosplenomegaly.  GU:  Normal male genitalia.  EXTREMITIES:  No clubbing, cyanosis or edema.  His peripheral pulses are  1+ and symmetric bilaterally.  There is a 2+ right femoral pulse.  No  audible bruit.  NEUROLOGIC:  Strength and sensation grossly  intact throughout.  Otherwise nonfocal.   CHEST X-RAY:  No acute cardiopulmonary process.   EKG:  Normal sinus rhythm with probable LVH and nonspecific ST changes,  likely secondary to repolarization abnormalities.   LABS:  Sodium 138, potassium 3.7, chloride 103, CO2 31, BUN 13,  creatinine 0.9, glucose 160.  Hemoglobin 15, hematocrit 44, CK 59, CK-MB  1.2.  Troponin I  0.01.   IMPRESSION:  1. Chest pain, rule out myocardial infarction .  2. Hypertension.  3. Early family history of coronary disease.   PLAN:  Although [name] symptoms seem somewhat atypical for pain of  cardiac origin, he has multiple risk factors including hypertension, a  history of smoking, age, and an early family history of myocardial  infarction in a brother at the age of 45.  His chest pain episode this  evening lasted only several minutes and then spontaneously resolved.  Currently he is asymptomatic without complaint.  Continue to  cycle  cardiac biomarkers to exclude myocardial infarction, with serial EKGs as  indicated.  The patient should be treated with aspirin daily  indefinitely.  I do not feel that the patient would require his  antithrombin therapy at this point.  I will continue to follow cardiac  biomarkers.  Given his multiple risk factors, the patient will require  further risk stratification with either cardiac catheterization or  noninvasive stress imaging.  We will discuss these options with the  patient in the morning.   We appreciate the opportunity to participate in the care of your  patient.  We will follow along with you throughout his hospital course.  Please fee free to call if there are any questions.      Audery Amel, MD  Electronically Signed     SHG/MEDQ  D:  06/16/2006  T:  06/16/2006  Job:  [id number]

## 2010-10-25 ENCOUNTER — Ambulatory Visit (INDEPENDENT_AMBULATORY_CARE_PROVIDER_SITE_OTHER): Payer: Medicare Other | Admitting: Family Medicine

## 2010-10-25 ENCOUNTER — Encounter: Payer: Self-pay | Admitting: Family Medicine

## 2010-10-25 DIAGNOSIS — Z125 Encounter for screening for malignant neoplasm of prostate: Secondary | ICD-10-CM

## 2010-10-25 DIAGNOSIS — Z13220 Encounter for screening for lipoid disorders: Secondary | ICD-10-CM

## 2010-10-25 DIAGNOSIS — I10 Essential (primary) hypertension: Secondary | ICD-10-CM

## 2010-10-25 DIAGNOSIS — L851 Acquired keratosis [keratoderma] palmaris et plantaris: Secondary | ICD-10-CM

## 2010-10-25 DIAGNOSIS — J309 Allergic rhinitis, unspecified: Secondary | ICD-10-CM

## 2010-10-25 LAB — POCT URINALYSIS DIPSTICK
Leukocytes, UA: NEGATIVE
Nitrite, UA: NEGATIVE
Spec Grav, UA: 1.03
Urobilinogen, UA: 1
pH, UA: 5

## 2010-10-25 LAB — BASIC METABOLIC PANEL
BUN: 16 mg/dL (ref 6–23)
CO2: 28 mEq/L (ref 19–32)
Calcium: 9 mg/dL (ref 8.4–10.5)
Chloride: 98 mEq/L (ref 96–112)
Creatinine, Ser: 0.8 mg/dL (ref 0.4–1.5)
GFR: 102.76 mL/min (ref >60.00)
Glucose, Bld: 233 mg/dL — ABNORMAL HIGH (ref 70–99)
Potassium: 3.7 mEq/L (ref 3.5–5.1)
Sodium: 137 mEq/L (ref 135–145)

## 2010-10-25 LAB — CBC WITH DIFFERENTIAL/PLATELET
Basophils Absolute: 0.1 10*3/uL (ref 0.0–0.1)
Basophils Relative: 1 % (ref 0.0–3.0)
Eosinophils Absolute: 0.2 10*3/uL (ref 0.0–0.7)
Eosinophils Relative: 2.3 % (ref 0.0–5.0)
HCT: 43 % (ref 39.0–52.0)
Hemoglobin: 14.9 g/dL (ref 13.0–17.0)
Lymphocytes Relative: 24.6 % (ref 12.0–46.0)
Lymphs Abs: 1.8 10*3/uL (ref 0.7–4.0)
MCHC: 34.6 g/dL (ref 30.0–36.0)
MCV: 96.3 fl (ref 78.0–100.0)
Monocytes Absolute: 0.8 10*3/uL (ref 0.1–1.0)
Monocytes Relative: 10.4 % (ref 3.0–12.0)
Neutro Abs: 4.6 10*3/uL (ref 1.4–7.7)
Neutrophils Relative %: 61.7 % (ref 43.0–77.0)
Platelets: 204 10*3/uL (ref 150.0–400.0)
RBC: 4.47 Mil/uL (ref 4.22–5.81)
RDW: 12.7 % (ref 11.5–14.6)
WBC: 7.4 10*3/uL (ref 4.5–10.5)

## 2010-10-25 LAB — HEPATIC FUNCTION PANEL
ALT: 24 U/L (ref 0–53)
AST: 29 U/L (ref 0–37)
Albumin: 3.8 g/dL (ref 3.5–5.2)
Alkaline Phosphatase: 48 U/L (ref 39–117)
Bilirubin, Direct: 0.2 mg/dL (ref 0.0–0.3)
Total Bilirubin: 0.6 mg/dL (ref 0.3–1.2)
Total Protein: 6.6 g/dL (ref 6.0–8.3)

## 2010-10-25 LAB — PSA: PSA: 0.26 ng/mL (ref 0.10–4.00)

## 2010-10-25 LAB — LDL CHOLESTEROL, DIRECT: Direct LDL: 69.5 mg/dL

## 2010-10-25 LAB — LIPID PANEL
Cholesterol: 124 mg/dL (ref 0–200)
HDL: 37.7 mg/dL — ABNORMAL LOW (ref >39.00)
Total CHOL/HDL Ratio: 3
Triglycerides: 225 mg/dL — ABNORMAL HIGH (ref 0.0–149.0)
VLDL: 45 mg/dL — ABNORMAL HIGH (ref 0.0–40.0)

## 2010-10-25 LAB — TSH: TSH: 1.65 u[IU]/mL (ref 0.35–5.50)

## 2010-10-25 MED ORDER — POTASSIUM CHLORIDE CRYS ER 20 MEQ PO TBCR: 20.0000 meq | EXTENDED_RELEASE_TABLET | Freq: Three times a day (TID) | ORAL | Status: DC

## 2010-10-25 MED ORDER — ATENOLOL-CHLORTHALIDONE 50-25 MG PO TABS: 1.0000 | ORAL_TABLET | Freq: Every day | ORAL | Status: DC

## 2010-10-25 MED ORDER — BETAMETHASONE DIPROPIONATE 0.05 % EX CREA: TOPICAL_CREAM | Freq: Every day | CUTANEOUS | Status: DC

## 2010-10-25 NOTE — Progress Notes (Signed)
Subjective:    Patient ID: [name], male    DOB: [birth year], 70 y.o.   MRN: [id number]  HPI  [name] is a delightful, 70 year old, married male, nonsmoker retired Emergency planning/management officer, who comes in today for general Medicare wellness exam.  Because of a history of underlying hypertension, and eczema.  His hypertension is treated with Tenoretic 50 -- 25 daily, and 181 mg baby aspirin.  BP 110/80.  He has decided chronic eczema for which he uses Diprolene p.r.n.  He has allergic rhinitis, for which he takes over-the-counter anti-histamines.  He also has a history of potassium deficiency, which he takes 60 mEq of potassium daily as a supplement.  Is also a history of previous skin cancers.  He is followed by dermatology.  A lesion that we removed from his right ear now is scabbed and inflamed.  Recommend he go back to see the dermatologist.  He had a recent cardiac evaluation, which was negative.  This spring.  He gets routine eye care, dental care, colonoscopy 4 years ago, normal, tetanus, 2008, Pneumovax, x 2, information given on shingles, activities of daily living reviewed.  He walks on a daily basis.  A home safety reviewed.  No particular issues.  There.  He does have guns in the house.  He is a retired Emergency planning/management officer.  He does have a healthcare power of attorney and living will.    Review of Systems  Constitutional: Negative.   HENT: Negative.   Eyes: Negative.   Respiratory: Negative.   Cardiovascular: Negative.   Gastrointestinal: Negative.   Genitourinary: Negative.   Musculoskeletal: Negative.   Skin: Negative.   Neurological: Negative.   Hematological: Negative.   Psychiatric/Behavioral: Negative.        Objective:   Physical Exam  Constitutional: He is oriented to person, place, and time. He appears well-developed and well-nourished.  HENT:  Head: Normocephalic and atraumatic.  Right Ear: External ear normal.  Left Ear: External ear normal.  Nose: Nose  normal.  Mouth/Throat: Oropharynx is clear and moist.  Eyes: Conjunctivae and EOM are normal. Pupils are equal, round, and reactive to light.  Neck: Normal range of motion. Neck supple. No JVD present. No tracheal deviation present. No thyromegaly present.  Cardiovascular: Normal rate, regular rhythm, normal heart sounds and intact distal pulses.  Exam reveals no gallop and no friction rub.   No murmur heard. Pulmonary/Chest: Effort normal and breath sounds normal. No stridor. No respiratory distress. He has no wheezes. He has no rales. He exhibits no tenderness.  Abdominal: Soft. Bowel sounds are normal. He exhibits no distension and no mass. There is no tenderness. There is no rebound and no guarding.  Genitourinary: Rectum normal, prostate normal and penis normal. Guaiac negative stool. No penile tenderness.  Musculoskeletal: Normal range of motion. He exhibits no edema and no tenderness.  Lymphadenopathy:    He has no cervical adenopathy.  Neurological: He is alert and oriented to person, place, and time. He has normal reflexes. No cranial nerve deficit. He exhibits normal muscle tone.  Skin: Skin is warm and dry. No rash noted. No erythema. No pallor.  Psychiatric: He has a normal mood and affect. His behavior is normal. Judgment and thought content normal.          Assessment & Plan:  Healthy male.  Hypertension.  Continue Tenoretic 50 -- 25 daily and 81-mg baby aspirin daily.  Decided chronic eczema.  Continue Diprolene .05% cream p.r.n.  History of hypokalemia continue potassium  supplement check potassium level.  Nonhealing lesion right ear referred back to dermatology

## 2010-10-25 NOTE — Patient Instructions (Signed)
Continue your current medications.  Follow up with dermatology about the lesion on your right ear.  Return one year or sooner if any problems.  Consider the shingles.  Vaccination

## 2010-10-26 NOTE — Progress Notes (Signed)
patient  Is aware 

## 2010-10-31 ENCOUNTER — Other Ambulatory Visit (INDEPENDENT_AMBULATORY_CARE_PROVIDER_SITE_OTHER): Payer: Medicare Other

## 2010-10-31 DIAGNOSIS — R739 Hyperglycemia, unspecified: Secondary | ICD-10-CM

## 2010-10-31 DIAGNOSIS — R7309 Other abnormal glucose: Secondary | ICD-10-CM

## 2010-10-31 LAB — HEMOGLOBIN A1C: Hgb A1c MFr Bld: 8.9 % — ABNORMAL HIGH (ref 4.6–6.5)

## 2010-11-01 NOTE — Progress Notes (Signed)
patient  Is aware 

## 2010-11-02 ENCOUNTER — Ambulatory Visit (INDEPENDENT_AMBULATORY_CARE_PROVIDER_SITE_OTHER): Payer: Medicare Other | Admitting: Family Medicine

## 2010-11-02 ENCOUNTER — Encounter: Payer: Self-pay | Admitting: Family Medicine

## 2010-11-02 VITALS — BP 110/72 | Temp 98.3°F | Wt 236.0 lb

## 2010-11-02 DIAGNOSIS — E1165 Type 2 diabetes mellitus with hyperglycemia: Secondary | ICD-10-CM | POA: Insufficient documentation

## 2010-11-02 DIAGNOSIS — IMO0001 Reserved for inherently not codable concepts without codable children: Secondary | ICD-10-CM

## 2010-11-02 DIAGNOSIS — E119 Type 2 diabetes mellitus without complications: Secondary | ICD-10-CM | POA: Insufficient documentation

## 2010-11-02 DIAGNOSIS — R739 Hyperglycemia, unspecified: Secondary | ICD-10-CM | POA: Insufficient documentation

## 2010-11-02 MED ORDER — METFORMIN HCL 500 MG PO TABS: ORAL_TABLET | ORAL | Status: DC

## 2010-11-02 NOTE — Patient Instructions (Signed)
Avoid carbohydrates.  Walk 15 minutes daily.  Take 500 mg of metformin prior to breakfast and prior to your evening meal.  Fasting blood sugar daily in the morning.  Return in two weeks for follow-up.  If you blood sugar drops below 90, then decrease the metformin to 250 mg twice daily

## 2010-11-02 NOTE — Progress Notes (Signed)
  Subjective:    Patient ID: [name], male    DOB: 1941-03-31, 70 y.o.   MRN: [id number]  HPI [name] is a 70 year old male, who comes in today for follow-up of diabetes.  He does have some mild glucose intolerance, however, his blood sugar has been 100 110 range with an A1c of 6.7.  Note is physical the other day.  He otherwise felt well and A1c was 8.9%.  We discussed diet, exercise, and the institution of medication with blood sugar monitoring at home.   Review of Systems General and metabolic review of systems otherwise negative    Objective:   Physical Exam     Well-developed well-nourished, down.  Acute distress   Assessment & Plan:  Diabetes type II,,,,,,,,,,,, carbohydrate restriction,,,,,,,,,, daily walking,,,,,,,,,, metformin 500 b.i.d.,,,,,,,, fasting blood sugar daily,,,,,,,,,, follow-up in two weeks

## 2010-11-16 ENCOUNTER — Ambulatory Visit (INDEPENDENT_AMBULATORY_CARE_PROVIDER_SITE_OTHER): Payer: Medicare Other | Admitting: Family Medicine

## 2010-11-16 ENCOUNTER — Encounter: Payer: Self-pay | Admitting: Family Medicine

## 2010-11-16 DIAGNOSIS — IMO0001 Reserved for inherently not codable concepts without codable children: Secondary | ICD-10-CM

## 2010-11-16 DIAGNOSIS — E1165 Type 2 diabetes mellitus with hyperglycemia: Secondary | ICD-10-CM

## 2010-11-16 NOTE — Progress Notes (Signed)
  Subjective:    Patient ID: [name], male    DOB: July 31, 1940, 70 y.o.   MRN: [id number]  HPI [name] is a 70 year old male, who comes in today for follow-up of uncontrolled diabetes.  We start him on metformin 500 b.i.d. As his blood sugar was markedly elevated.  It's come down, but is still in the 160 to 180 range.  No hypoglycemia.  He is following his diet and is exercising on a regular basis.   Review of Systems    General and metabolic review of systems otherwise negative Objective:   Physical Exam Well-developed well-nourished, male in no acute distress       Assessment & Plan:  Diabetes type 2, not at goal increase metformin to 1000 the morning, 500 mg prior to her evening meal.  Follow-up in two weeks

## 2010-11-16 NOTE — Patient Instructions (Signed)
Increase the metformin to 1000 mg prior to breakfast continue the 500-mg dose prior to evening meal.  If in one week your blood sugar is normal,,,,100,,,,,,,,, then continue that dose,,,,,,,,,,,, if  not increase  y  metformin to 1000 mg twice daily.  Return in two weeks for follow-up

## 2010-11-23 ENCOUNTER — Ambulatory Visit: Payer: PRIVATE HEALTH INSURANCE | Admitting: Family Medicine

## 2010-11-30 ENCOUNTER — Encounter: Payer: Self-pay | Admitting: Family Medicine

## 2010-11-30 ENCOUNTER — Ambulatory Visit (INDEPENDENT_AMBULATORY_CARE_PROVIDER_SITE_OTHER): Payer: Medicare Other | Admitting: Family Medicine

## 2010-11-30 DIAGNOSIS — IMO0001 Reserved for inherently not codable concepts without codable children: Secondary | ICD-10-CM

## 2010-11-30 DIAGNOSIS — E1165 Type 2 diabetes mellitus with hyperglycemia: Secondary | ICD-10-CM

## 2010-11-30 NOTE — Patient Instructions (Signed)
Continue the metformin 1000 mg twice daily.  Walk 20 minutes daily.  Follow-up in two weeks

## 2010-11-30 NOTE — Progress Notes (Signed)
  Subjective:    Patient ID: [name], male    DOB: 06-Oct-1940, 70 y.o.   MRN: [id number]  HPI [name] is a 70 year old male, who comes in today for follow-up of diabetes,  We've increased his Glucophage slowly now is up to thousand milligrams b.i.d. He is getting the blood sugar down to 115 to 120 range.  No hypoglycemia   Review of Systems General and metabolic review of systems otherwise negative    Objective:   Physical Exam Well-developed well-nourished, male count acute distress       Assessment & Plan:  Diabetes type 2, improved control continue metformin 1000 b.i.d., walk 20 minutes daily.  Follow-up in two weeks

## 2010-12-08 ENCOUNTER — Other Ambulatory Visit: Payer: Self-pay | Admitting: Dermatology

## 2010-12-14 ENCOUNTER — Ambulatory Visit (INDEPENDENT_AMBULATORY_CARE_PROVIDER_SITE_OTHER): Payer: Medicare Other | Admitting: Family Medicine

## 2010-12-14 ENCOUNTER — Encounter: Payer: Self-pay | Admitting: Family Medicine

## 2010-12-14 VITALS — BP 120/70 | Temp 98.0°F | Wt 229.0 lb

## 2010-12-14 DIAGNOSIS — IMO0001 Reserved for inherently not codable concepts without codable children: Secondary | ICD-10-CM

## 2010-12-14 DIAGNOSIS — E1165 Type 2 diabetes mellitus with hyperglycemia: Secondary | ICD-10-CM

## 2010-12-14 MED ORDER — METFORMIN HCL 1000 MG PO TABS: ORAL_TABLET | ORAL | Status: DC

## 2010-12-14 NOTE — Progress Notes (Signed)
  Subjective:    Patient ID: [name], male    DOB: 1940/08/29, 70 y.o.   MRN: [id number]  HPI  [name] is a 70 year old male, who comes in today for follow-up of diabetes.  We increased his metformin to 1000 mg b.i.d. Blood sugars dropped down to 130.  Is exercising daily.  Weight down to 229.  We discussed various options he would like to give this a little more time.  Review of Systems General metabolic review of systems otherwise negative    Objective:   Physical Exam    Well-developed well-nourished, male in no acute distress    Assessment & Plan:  Diabetes type II continue metformin 1000 b.i.d. Follow-up in 4 weeks

## 2010-12-14 NOTE — Patient Instructions (Signed)
Continue the metformin 1000 mg b.i.d. Follow-up in 4 weeks

## 2010-12-25 ENCOUNTER — Telehealth: Payer: Self-pay | Admitting: Family Medicine

## 2010-12-25 ENCOUNTER — Other Ambulatory Visit: Payer: Self-pay | Admitting: *Deleted

## 2010-12-25 DIAGNOSIS — E119 Type 2 diabetes mellitus without complications: Secondary | ICD-10-CM

## 2010-12-25 DIAGNOSIS — I10 Essential (primary) hypertension: Secondary | ICD-10-CM

## 2010-12-25 MED ORDER — ATENOLOL-CHLORTHALIDONE 50-25 MG PO TABS: 1.0000 | ORAL_TABLET | Freq: Every day | ORAL | Status: DC

## 2010-12-25 MED ORDER — GLUCOSE BLOOD VI STRP: 1.0000 | ORAL_STRIP | Freq: Every day | Status: DC

## 2010-12-25 MED ORDER — GLUCOSE BLOOD VI STRP: ORAL_STRIP | Status: DC

## 2010-12-25 NOTE — Telephone Encounter (Signed)
Pharmacy needs test strip prescription sent again with the diagnosis code(did not come through on prescription and needs it to fill) and the explanation.

## 2010-12-25 NOTE — Telephone Encounter (Signed)
Pt used his last Test Strips for his Accu chek Aviva Meter. Pt believes that Dr Tawanna Cooler gave him some from office and there wasn't a script written. Pls call pt when ready for pick up or call in script to [facility] on [address].

## 2011-01-11 ENCOUNTER — Encounter: Payer: Self-pay | Admitting: Family Medicine

## 2011-01-11 ENCOUNTER — Ambulatory Visit (INDEPENDENT_AMBULATORY_CARE_PROVIDER_SITE_OTHER): Payer: Medicare Other | Admitting: Family Medicine

## 2011-01-11 DIAGNOSIS — IMO0001 Reserved for inherently not codable concepts without codable children: Secondary | ICD-10-CM

## 2011-01-11 DIAGNOSIS — I10 Essential (primary) hypertension: Secondary | ICD-10-CM

## 2011-01-11 DIAGNOSIS — E1165 Type 2 diabetes mellitus with hyperglycemia: Secondary | ICD-10-CM

## 2011-01-11 MED ORDER — ACCU-CHEK MULTICLIX LANCETS MISC: Status: DC

## 2011-01-11 MED ORDER — GLIPIZIDE 5 MG PO TABS: ORAL_TABLET | ORAL | Status: DC

## 2011-01-11 NOTE — Patient Instructions (Signed)
Continue the metformin 1000 mg twice daily, and a 5-mg Glipizide  tablet in the morning prior to breakfast  Check a fasting blood sugar daily in the morning.  Return in 4 weeks for follow-up

## 2011-01-11 NOTE — Progress Notes (Signed)
  Subjective:    Patient ID: [name], male    DOB: 1941-04-16, 70 y.o.   MRN: [id number]  HPI [name] is a 70 year old male, who comes in today for follow-up of diabetes and hypertension.  We have adjusted his antihypertensive medication.  BP now 120/80.  We increased his metformin from 500 b.i.d. To 1000 b.i.d. Blood sugar still not normal.  It's in the 140 to 150 range.  Will begin to another oral agent glipizide   Review of Systems General metabolic review of systems otherwise negative    Objective:   Physical Exam  Well-developed well-nourished, male in no acute distress      Assessment & Plan:  Diabetes type II non-ago at glipizide 5 mg daily follow-up in 4 weeks

## 2011-01-12 ENCOUNTER — Telehealth: Payer: Self-pay | Admitting: Family Medicine

## 2011-01-12 NOTE — Telephone Encounter (Signed)
Pt came by office and said that [facility] on [address] would not refill pts script for Lancets and for test strips, because pharmacy said that they do not have the Diabetic Code on script. Pt is req that nurse call [facility] on [address] and speak to pharmacist about the code that is needed. Pt says that his Medicare will not pay withouth Diabetic code.

## 2011-01-12 NOTE — Telephone Encounter (Signed)
Done and prescription up front.

## 2011-01-15 ENCOUNTER — Other Ambulatory Visit: Payer: Self-pay | Admitting: *Deleted

## 2011-01-15 DIAGNOSIS — E119 Type 2 diabetes mellitus without complications: Secondary | ICD-10-CM

## 2011-01-15 MED ORDER — GLUCOSE BLOOD VI STRP: ORAL_STRIP | Status: DC

## 2011-01-15 MED ORDER — ACCU-CHEK MULTICLIX LANCETS MISC: Status: DC

## 2011-02-07 ENCOUNTER — Ambulatory Visit: Payer: PRIVATE HEALTH INSURANCE | Admitting: Family Medicine

## 2011-02-07 LAB — HM DIABETES EYE EXAM

## 2011-02-08 ENCOUNTER — Encounter: Payer: Self-pay | Admitting: Family Medicine

## 2011-02-08 ENCOUNTER — Ambulatory Visit (INDEPENDENT_AMBULATORY_CARE_PROVIDER_SITE_OTHER): Payer: Medicare Other | Admitting: Family Medicine

## 2011-02-08 DIAGNOSIS — E119 Type 2 diabetes mellitus without complications: Secondary | ICD-10-CM

## 2011-02-08 DIAGNOSIS — L851 Acquired keratosis [keratoderma] palmaris et plantaris: Secondary | ICD-10-CM

## 2011-02-08 DIAGNOSIS — I10 Essential (primary) hypertension: Secondary | ICD-10-CM

## 2011-02-08 NOTE — Patient Instructions (Signed)
Continue your current medications.  Follow-up in May for your annual exam.  Fasting labs one week prior

## 2011-02-08 NOTE — Progress Notes (Signed)
  Subjective:    Patient ID: [name], male    DOB: 1940-11-19, 70 y.o.   MRN: [id number]  HPI [name] is a 70 year old male comes in today for follow-up of diabetes and hypertension.  He is currently on glipizide 5 mg daily and metformin 1000 mg b.i.d. Blood sugar yesterday fasting was 90.  He feels well.  No hypoglycemia.  Blood pressure normal 110/70.  Recently had Mohs surgery.  Right ear lobe.  Because of a basal cell carcinoma   Review of Systems General and metabolic review of systems otherwise negative    Objective:   Physical Exam  Well-developed well-nourished, male in no acute distress      Assessment & Plan:  Diabetes type 2, under good control continue current therapy.  Hypertension under good control continue current therapy.  Return in May for your annual exam

## 2011-02-14 ENCOUNTER — Encounter: Payer: Self-pay | Admitting: Family Medicine

## 2011-04-10 ENCOUNTER — Encounter: Payer: Self-pay | Admitting: Family Medicine

## 2011-04-10 ENCOUNTER — Ambulatory Visit (INDEPENDENT_AMBULATORY_CARE_PROVIDER_SITE_OTHER): Payer: Medicare Other | Admitting: Family Medicine

## 2011-04-10 VITALS — BP 140/80 | Temp 98.1°F | Wt 232.0 lb

## 2011-04-10 DIAGNOSIS — Z23 Encounter for immunization: Secondary | ICD-10-CM

## 2011-04-10 DIAGNOSIS — J45901 Unspecified asthma with (acute) exacerbation: Secondary | ICD-10-CM

## 2011-04-10 DIAGNOSIS — J309 Allergic rhinitis, unspecified: Secondary | ICD-10-CM

## 2011-04-10 MED ORDER — PREDNISONE 20 MG PO TABS: ORAL_TABLET | ORAL | Status: DC

## 2011-04-10 NOTE — Progress Notes (Signed)
  Subjective:    Patient ID: [name], male    DOB: June 30, 1940, 70 y.o.   MRN: [id number]  HPI Jesusita Oka is a 70 year old, married male, nonsmoker, who comes in with a 3 week history of cough and wheezing.  He has a history of perennial allergic rhinitis and occasional asthma.  His last episode of asthma was last spring with change in season.  He has been able to sleep at night.  No fever   Review of Systems    General and pulmonary views systems otherwise negative except he stopped his Claritin Objective:   Physical Exam  Well punishment.  Acute distress.  Examination HEENT negative.  Neck was supple.  No adenopathy.  Lungs shows symmetrical.  Breath sounds, late, mild expiratory wheezing bilaterally      Assessment & Plan:  Asthma............ Plan prednisone burst and taper return p.r.n.

## 2011-04-10 NOTE — Patient Instructions (Signed)
Restart the prednisone as directed.  Also, take 10 mg of plain Claritin daily, all year round.  Return p.r.n.

## 2011-04-18 ENCOUNTER — Telehealth: Payer: Self-pay | Admitting: Family Medicine

## 2011-04-18 NOTE — Telephone Encounter (Signed)
Pt called and has questions re: Metformin. Pt says that his bs is dropping to low. Last fasting bs reading was 90. Pt says that his bs has dropped considerably since being put on two other last week. Pt believes that prednisone and clariton are effecting pts bs lvls.

## 2011-04-18 NOTE — Telephone Encounter (Signed)
Fleet Contras please call low blood sugar would be below 70............ 90 is okay

## 2011-04-18 NOTE — Telephone Encounter (Signed)
Patient is aware 

## 2011-06-06 ENCOUNTER — Telehealth: Payer: Self-pay | Admitting: Family Medicine

## 2011-06-06 NOTE — Telephone Encounter (Signed)
Pt needs a new script for Halobetasol Propionate Cream for rash. Pls call in to [facility]. Pt would like to be notified when this has been called in.

## 2011-06-07 MED ORDER — HALOBETASOL PROPIONATE 0.05 % EX CREA: TOPICAL_CREAM | Freq: Two times a day (BID) | CUTANEOUS | Status: DC

## 2011-06-07 NOTE — Telephone Encounter (Signed)
Ok per Dr. Todd 

## 2011-06-07 NOTE — Telephone Encounter (Signed)
Rx sent 

## 2011-07-05 DIAGNOSIS — L97509 Non-pressure chronic ulcer of other part of unspecified foot with unspecified severity: Secondary | ICD-10-CM | POA: Diagnosis not present

## 2011-07-05 DIAGNOSIS — E1149 Type 2 diabetes mellitus with other diabetic neurological complication: Secondary | ICD-10-CM | POA: Diagnosis not present

## 2011-07-10 ENCOUNTER — Encounter: Payer: Self-pay | Admitting: Family Medicine

## 2011-07-10 ENCOUNTER — Ambulatory Visit (INDEPENDENT_AMBULATORY_CARE_PROVIDER_SITE_OTHER): Payer: Medicare Other | Admitting: Family Medicine

## 2011-07-10 VITALS — BP 120/80 | Temp 97.7°F

## 2011-07-10 DIAGNOSIS — R079 Chest pain, unspecified: Secondary | ICD-10-CM

## 2011-07-10 DIAGNOSIS — J309 Allergic rhinitis, unspecified: Secondary | ICD-10-CM | POA: Diagnosis not present

## 2011-07-10 DIAGNOSIS — R0989 Other specified symptoms and signs involving the circulatory and respiratory systems: Secondary | ICD-10-CM | POA: Diagnosis not present

## 2011-07-10 DIAGNOSIS — I4949 Other premature depolarization: Secondary | ICD-10-CM | POA: Diagnosis not present

## 2011-07-10 DIAGNOSIS — I493 Ventricular premature depolarization: Secondary | ICD-10-CM

## 2011-07-10 NOTE — Progress Notes (Signed)
  Subjective:    Patient ID: [name], male    DOB: 1940/08/02, 71 y.o.   MRN: [id number]  HPI [name] is a 71 year old male who comes in today for evaluation of a couple problems  He states for the last month he's had intermittent sharp chest pain. He states it sometimes hurts in the front sometimes in the back it lasts for a second or 2 and goes away. It usually associated with bending and twisting. No cardiopulmonary symptoms.  He also has a history of allergic rhinitis and asthma and for the past 6 weeks has had head congestion postnasal drip and nonproductive cough.  Last week he went to see his orthopedist because of pain in his foot. He actually played golf with a golf ball issue and developed a secondary cellulitis. They started him on antibiotics doxycycline and he has a boot on.    Review of Systems General cardiopulmonary review of systems otherwise negative    Objective:   Physical Exam Well-developed well-nourished male in no acute distress HEENT negative neck was supple lungs are clear to auscultation cardiac exam normal except for an occasional skip       Assessment & Plan:  Allergic rhinitis plan treat symptomatically with Claritin  Chest wall pain reassured  Abnormal EKGs with frequent PVCs and PVCs in couplets plan cardiac consult

## 2011-07-10 NOTE — Patient Instructions (Signed)
Take plain Zyrtec 10 mg at bedtime for the allergic symptoms  We will get you set up a consult and cardiology for evaluation of the cardiac arrhythmia.

## 2011-07-19 DIAGNOSIS — E1149 Type 2 diabetes mellitus with other diabetic neurological complication: Secondary | ICD-10-CM | POA: Diagnosis not present

## 2011-07-19 DIAGNOSIS — L97509 Non-pressure chronic ulcer of other part of unspecified foot with unspecified severity: Secondary | ICD-10-CM | POA: Diagnosis not present

## 2011-07-27 ENCOUNTER — Ambulatory Visit (INDEPENDENT_AMBULATORY_CARE_PROVIDER_SITE_OTHER): Payer: Medicare Other | Admitting: Cardiology

## 2011-07-27 ENCOUNTER — Encounter: Payer: Self-pay | Admitting: Cardiology

## 2011-07-27 DIAGNOSIS — I10 Essential (primary) hypertension: Secondary | ICD-10-CM | POA: Diagnosis not present

## 2011-07-27 DIAGNOSIS — R002 Palpitations: Secondary | ICD-10-CM

## 2011-07-27 DIAGNOSIS — I4949 Other premature depolarization: Secondary | ICD-10-CM

## 2011-07-27 DIAGNOSIS — I493 Ventricular premature depolarization: Secondary | ICD-10-CM | POA: Insufficient documentation

## 2011-07-27 DIAGNOSIS — R079 Chest pain, unspecified: Secondary | ICD-10-CM | POA: Diagnosis not present

## 2011-07-27 LAB — BASIC METABOLIC PANEL
BUN: 18 mg/dL (ref 6–23)
CO2: 29 mEq/L (ref 19–32)
Calcium: 9.4 mg/dL (ref 8.4–10.5)
Chloride: 101 mEq/L (ref 96–112)
Creatinine, Ser: 0.8 mg/dL (ref 0.4–1.5)
GFR: 95.53 mL/min (ref >60.00)
Glucose, Bld: 86 mg/dL (ref 70–99)
Potassium: 3.8 mEq/L (ref 3.5–5.1)
Sodium: 139 mEq/L (ref 135–145)

## 2011-07-27 LAB — TSH: TSH: 1.38 u[IU]/mL (ref 0.35–5.50)

## 2011-07-27 LAB — MAGNESIUM: Magnesium: 1.6 mg/dL (ref 1.5–2.5)

## 2011-07-27 NOTE — Assessment & Plan Note (Signed)
The patient does have some ectopy. However, this is not particularly symptomatic. I will check some labs to include a TSH magnesium and potassium. I will check an echocardiogram to make sure he has a structurally normal heart particularly in light of his history of rheumatic disease. If these are both unremarkable and he remains asymptomatic and no further testing or change in therapy will be indicated.

## 2011-07-27 NOTE — Patient Instructions (Signed)
Please have blood work today (BMP, MG AND TSH)  Your physician has requested that you have an echocardiogram. Echocardiography is a painless test that uses sound waves to create images of your heart. It provides your doctor with information about the size and shape of your heart and how well your heart's chambers and valves are working. This procedure takes approximately one hour. There are no restrictions for this procedure.  You will be called with the results of your testing once they are available.  The current medical regimen is effective;  continue present plan and medications.  Follow up as needed with Dr Antoine Poche

## 2011-07-27 NOTE — Assessment & Plan Note (Signed)
The blood pressure is at target. No change in medications is indicated. We will continue with therapeutic lifestyle changes (TLC).  

## 2011-07-27 NOTE — Progress Notes (Signed)
HPI The patient presents for evaluation of premature ventricular contractions. He has a history of rheumatic fever as a child but apparently no sequelae from this. He did have a catheterization in 2008 demonstrating minimal nonobstructive coronary disease. Apparently this was for chest pain. Since this he has had no further cardiovascular testing. However, he was recently found to have frequent ventricular ectopy. He was noted to have PVCs. He denies any chest pressure, neck or arm discomfort. He will notice occasional palpitations but these are infrequent and fleeting. He has had some sharp occasional left sided brief chest pain. He was active until some recent foot problems. He did not bring on any palpitations or chest discomfort with this. He describes no new shortness of breath, PND or orthopnea. He has had no presyncope or syncope. He is somewhat limited by back and joint pains. He was recently found to have diabetes which is diet controlled.   Allergies  Allergen Reactions  . Penicillins     REACTION: nausea, vomit    Current Outpatient Prescriptions  Medication Sig Dispense Refill  . aspirin 81 MG tablet Take 81 mg by mouth daily.        Marland Kitchen atenolol-chlorthalidone (TENORETIC) 50-25 MG per tablet Take 1 tablet by mouth daily.  100 tablet  3  . glipiZIDE (GLUCOTROL) 5 MG tablet One tablet prior to breakfast  100 tablet  3  . glucose blood (ACCU-CHEK AVIVA) test strip Use once daily 250.02 dx  100 each  5  . Lancets (ACCU-CHEK MULTICLIX) lancets Use as instructed  102 each  3  . metFORMIN (GLUCOPHAGE) 1000 MG tablet One p.o. B.i.d.  200 tablet  3  . potassium chloride SA (K-DUR,KLOR-CON) 20 MEQ tablet Take 1 tablet (20 mEq total) by mouth 3 (three) times daily.  300 tablet  3    Past Medical History  Diagnosis Date  . ED (erectile dysfunction)   . Rhinitis   . Hypertension   . Osteoarthritis   . CAD (coronary artery disease)     Minimal plaque cath 2008  . Rheumatic fever   .  Diabetes mellitus     Past Surgical History  Procedure Date  . Inguinal hernia repair   . Colonoscopy 2006, 2010    Family History  Problem Relation Age of Onset  . Stroke Father 59    Died age 24  . Coronary artery disease Brother 3    History   Social History  . Marital Status: Married    Spouse Name: N/A    Number of Children: 1  . Years of Education: N/A   Occupational History  .     Social History Main Topics  . Smoking status: Former Smoker    Quit date: 07/26/1981  . Smokeless tobacco: Not on file  . Alcohol Use: Yes  . Drug Use: No  . Sexually Active: Not on file   Other Topics Concern  . Not on file   Social History Narrative   Lives with wife.    ROS:  As stated in the HPI and negative for all other systems.  PHYSICAL EXAM BP 120/70  Pulse 60  Ht 6' (1.829 m)  Wt 234 lb (106.142 kg)  BMI 31.74 kg/m2 GENERAL:  Well appearing HEENT:  Pupils equal round and reactive, fundi not visualized, oral mucosa unremarkable, dentures NECK:  No jugular venous distention, waveform within normal limits, carotid upstroke brisk and symmetric, no bruits, no thyromegaly LYMPHATICS:  No cervical, inguinal adenopathy LUNGS:  Clear  to auscultation bilaterally BACK:  No CVA tenderness CHEST:  Unremarkable HEART:  PMI not displaced or sustained,S1 and S2 within normal limits, no S3, no S4, no clicks, no rubs, no murmurs ABD:  Flat, positive bowel sounds normal in frequency in pitch, no bruits, no rebound, no guarding, no midline pulsatile mass, no hepatomegaly, no splenomegaly EXT:  2 plus pulses throughout, no edema, no cyanosis no clubbing SKIN:  No rashes no nodules, left second and third toe with healing ulcers NEURO:  Cranial nerves II through XII grossly intact, motor grossly intact throughout PSYCH:  Cognitively intact, oriented to person place and time  EKG:  07/10/11  Sinus rhythm, axis within normal limits, RSR prime V1, early transition in lead V2, premature  ectopic complex, no acute ST-T wave changes.   ASSESSMENT AND PLAN

## 2011-07-31 ENCOUNTER — Other Ambulatory Visit: Payer: Self-pay | Admitting: Dermatology

## 2011-07-31 DIAGNOSIS — L570 Actinic keratosis: Secondary | ICD-10-CM | POA: Diagnosis not present

## 2011-07-31 DIAGNOSIS — Z85828 Personal history of other malignant neoplasm of skin: Secondary | ICD-10-CM | POA: Diagnosis not present

## 2011-08-02 ENCOUNTER — Ambulatory Visit (HOSPITAL_COMMUNITY): Payer: Medicare Other | Attending: Cardiology

## 2011-08-02 ENCOUNTER — Other Ambulatory Visit: Payer: Self-pay

## 2011-08-02 DIAGNOSIS — I10 Essential (primary) hypertension: Secondary | ICD-10-CM | POA: Insufficient documentation

## 2011-08-02 DIAGNOSIS — I517 Cardiomegaly: Secondary | ICD-10-CM

## 2011-08-02 DIAGNOSIS — E119 Type 2 diabetes mellitus without complications: Secondary | ICD-10-CM | POA: Insufficient documentation

## 2011-08-02 DIAGNOSIS — I4949 Other premature depolarization: Secondary | ICD-10-CM | POA: Insufficient documentation

## 2011-08-02 DIAGNOSIS — E669 Obesity, unspecified: Secondary | ICD-10-CM | POA: Diagnosis not present

## 2011-08-02 DIAGNOSIS — R002 Palpitations: Secondary | ICD-10-CM | POA: Diagnosis not present

## 2011-08-02 DIAGNOSIS — R079 Chest pain, unspecified: Secondary | ICD-10-CM | POA: Insufficient documentation

## 2011-08-14 DIAGNOSIS — L97509 Non-pressure chronic ulcer of other part of unspecified foot with unspecified severity: Secondary | ICD-10-CM | POA: Diagnosis not present

## 2011-08-14 DIAGNOSIS — E1149 Type 2 diabetes mellitus with other diabetic neurological complication: Secondary | ICD-10-CM | POA: Diagnosis not present

## 2011-08-14 DIAGNOSIS — L03019 Cellulitis of unspecified finger: Secondary | ICD-10-CM | POA: Diagnosis not present

## 2011-08-17 ENCOUNTER — Other Ambulatory Visit: Payer: Self-pay | Admitting: *Deleted

## 2011-08-17 DIAGNOSIS — R002 Palpitations: Secondary | ICD-10-CM

## 2011-08-21 ENCOUNTER — Encounter: Payer: Self-pay | Admitting: Cardiology

## 2011-08-21 ENCOUNTER — Other Ambulatory Visit (INDEPENDENT_AMBULATORY_CARE_PROVIDER_SITE_OTHER): Payer: Medicare Other

## 2011-08-21 ENCOUNTER — Ambulatory Visit (INDEPENDENT_AMBULATORY_CARE_PROVIDER_SITE_OTHER): Payer: Medicare Other | Admitting: Cardiology

## 2011-08-21 DIAGNOSIS — R002 Palpitations: Secondary | ICD-10-CM

## 2011-08-21 DIAGNOSIS — I10 Essential (primary) hypertension: Secondary | ICD-10-CM

## 2011-08-21 DIAGNOSIS — I4949 Other premature depolarization: Secondary | ICD-10-CM | POA: Diagnosis not present

## 2011-08-21 DIAGNOSIS — I493 Ventricular premature depolarization: Secondary | ICD-10-CM

## 2011-08-21 DIAGNOSIS — R079 Chest pain, unspecified: Secondary | ICD-10-CM

## 2011-08-21 NOTE — Assessment & Plan Note (Signed)
For now he will continue on the medications as listed.

## 2011-08-21 NOTE — Progress Notes (Signed)
   HPI The patient presents for evaluation of premature ventricular contractions. At the last appointment he discussed this as well as some chest discomfort. I sent him for an echocardiogram which was essentially normal. His magnesium was slightly low and is being repeated today. He however continues to have symptoms. He feels a sense of congestion or fullness. It ccurs sporadically. He is not necessarily associated with his palpitations which he feels.  However, it is very intermittently and gives him a sense of unease. He is not exercising because of this.  He has not had presyncope or syncope. He does have some dyspnea but no PND or orthopnea.  Allergies  Allergen Reactions  . Penicillins     REACTION: nausea, vomit    Current Outpatient Prescriptions  Medication Sig Dispense Refill  . aspirin 81 MG tablet Take 81 mg by mouth daily.        Marland Kitchen atenolol-chlorthalidone (TENORETIC) 50-25 MG per tablet Take 1 tablet by mouth daily.  100 tablet  3  . glipiZIDE (GLUCOTROL) 5 MG tablet One tablet prior to breakfast  100 tablet  3  . glucose blood (ACCU-CHEK AVIVA) test strip Use once daily 250.02 dx  100 each  5  . Lancets (ACCU-CHEK MULTICLIX) lancets Use as instructed  102 each  3  . loratadine (CLARITIN) 10 MG tablet Take 10 mg by mouth daily.      . metFORMIN (GLUCOPHAGE) 1000 MG tablet One p.o. B.i.d.  200 tablet  3  . potassium chloride SA (K-DUR,KLOR-CON) 20 MEQ tablet Take 1 tablet (20 mEq total) by mouth 3 (three) times daily.  300 tablet  3    Past Medical History  Diagnosis Date  . ED (erectile dysfunction)   . Rhinitis   . Hypertension   . Osteoarthritis   . CAD (coronary artery disease)     Minimal plaque cath 2008  . Rheumatic fever   . Diabetes mellitus     Past Surgical History  Procedure Date  . Inguinal hernia repair   . Colonoscopy 2006, 2010   ROS:  As stated in the HPI and negative for all other systems.  PHYSICAL EXAM BP 120/70  Pulse 58  Ht 6' (1.829 m)   Wt 235 lb (106.595 kg)  BMI 31.87 kg/m2 GENERAL:  Well appearing HEENT:  Pupils equal round and reactive, fundi not visualized, oral mucosa unremarkable, dentures NECK:  No jugular venous distention, waveform within normal limits, carotid upstroke brisk and symmetric, no bruits, no thyromegaly LYMPHATICS:  No cervical, inguinal adenopathy LUNGS:  Clear to auscultation bilaterally BACK:  No CVA tenderness CHEST:  Unremarkable HEART:  PMI not displaced or sustained,S1 and S2 within normal limits, no S3, no S4, no clicks, no rubs, no murmurs ABD:  Flat, positive bowel sounds normal in frequency in pitch, no bruits, no rebound, no guarding, no midline pulsatile mass, no hepatomegaly, no splenomegaly EXT:  2 plus pulses throughout, no edema, no cyanosis no clubbing SKIN:  No rashes no nodules, left second and third toe with healing ulcers   ASSESSMENT AND PLAN

## 2011-08-21 NOTE — Assessment & Plan Note (Signed)
I will supplement his magnesium is low. I may try low-dose calcium channel blocker titrate his beta blocker if he continues to have symptoms.

## 2011-08-21 NOTE — Assessment & Plan Note (Signed)
He does have risk factors. He had nonobstructive coronary disease on catheterization 5 years ago. At this point stress testing is indicated to evaluate his symptoms. However, he says he could not walk on the treadmill and so he will having Lexiscan Myoview.

## 2011-08-21 NOTE — Patient Instructions (Signed)
Your physician has requested that you have a lexiscan myoview. For further information please visit [email or web address] Please follow instruction sheet, as given.  The current medical regimen is effective;  continue present plan and medications.  Keep follow up as scheduled

## 2011-08-22 LAB — MAGNESIUM: Magnesium: 1.6 mg/dL (ref 1.5–2.5)

## 2011-08-30 ENCOUNTER — Ambulatory Visit (HOSPITAL_COMMUNITY): Payer: Medicare Other | Attending: Cardiovascular Disease | Admitting: Radiology

## 2011-08-30 DIAGNOSIS — Z8249 Family history of ischemic heart disease and other diseases of the circulatory system: Secondary | ICD-10-CM | POA: Diagnosis not present

## 2011-08-30 DIAGNOSIS — R079 Chest pain, unspecified: Secondary | ICD-10-CM | POA: Diagnosis not present

## 2011-08-30 DIAGNOSIS — Z87891 Personal history of nicotine dependence: Secondary | ICD-10-CM | POA: Insufficient documentation

## 2011-08-30 DIAGNOSIS — I2510 Atherosclerotic heart disease of native coronary artery without angina pectoris: Secondary | ICD-10-CM

## 2011-08-30 DIAGNOSIS — R002 Palpitations: Secondary | ICD-10-CM | POA: Insufficient documentation

## 2011-08-30 DIAGNOSIS — R0989 Other specified symptoms and signs involving the circulatory and respiratory systems: Secondary | ICD-10-CM | POA: Insufficient documentation

## 2011-08-30 DIAGNOSIS — R000 Tachycardia, unspecified: Secondary | ICD-10-CM | POA: Diagnosis not present

## 2011-08-30 DIAGNOSIS — R0789 Other chest pain: Secondary | ICD-10-CM | POA: Insufficient documentation

## 2011-08-30 DIAGNOSIS — I4949 Other premature depolarization: Secondary | ICD-10-CM

## 2011-08-30 DIAGNOSIS — R0609 Other forms of dyspnea: Secondary | ICD-10-CM | POA: Insufficient documentation

## 2011-08-30 DIAGNOSIS — E119 Type 2 diabetes mellitus without complications: Secondary | ICD-10-CM | POA: Insufficient documentation

## 2011-08-30 DIAGNOSIS — I10 Essential (primary) hypertension: Secondary | ICD-10-CM | POA: Insufficient documentation

## 2011-08-30 MED ORDER — REGADENOSON 0.4 MG/5ML IV SOLN
0.4000 mg | Freq: Once | INTRAVENOUS | Status: AC
  Administered 2011-08-30: 0.4 mg via INTRAVENOUS

## 2011-08-30 MED ORDER — TECHNETIUM TC 99M TETROFOSMIN IV KIT
33.0000 | PACK | Freq: Once | INTRAVENOUS | Status: AC | PRN
  Administered 2011-08-30: 33 via INTRAVENOUS

## 2011-08-30 MED ORDER — TECHNETIUM TC 99M TETROFOSMIN IV KIT
11.0000 | PACK | Freq: Once | INTRAVENOUS | Status: AC | PRN
  Administered 2011-08-30: 11 via INTRAVENOUS

## 2011-08-30 NOTE — Progress Notes (Signed)
[facility] SITE 3 NUCLEAR MED [address] [place] [address] [phone]  Cardiology Nuclear Med Study  [name] is a 71 y.o. male     MRN : [id number]     DOB: [birth year]  Procedure Date: 08/30/2011  Nuclear Med Background Indication for Stress Test:  Evaluation for Ischemia History:  '08 Cath:n/o CAD, normal LVF; 08/02/11 Echo:EF=60% Cardiac Risk Factors: Family History - CAD, History of Smoking, Hypertension and NIDDM  Symptoms:  Chest Pain/Tightness (last episode of chest discomfort was this am, 1-2/10), DOE, Palpitations and Rapid HR   Nuclear Pre-Procedure Caffeine/Decaff Intake:  None NPO After: 7:30pm   Lungs:  clear O2 Sat: 95% on room air. IV 0.9% NS with Angio Cath:  20g  IV Site: R Antecubital  IV Started by:  Stanton Kidney, EMT-P  Chest Size (in):  44 Cup Size: n/a  Height: 6' (1.829 m)  Weight:  232 lb (105.235 kg)  BMI:  Body mass index is 31.47 kg/(m^2). Tech Comments:  Atenolol was taken as directed, CBG= 106 @ 6:15 am, per patient.    Nuclear Med Study 1 or 2 day study: 1 day  Stress Test Type:  Treadmill/Lexiscan  Reading MD: Kristeen Miss, MD  Order Authorizing Provider:  Rollene Rotunda, MD  Resting Radionuclide: Technetium 26m Tetrofosmin  Resting Radionuclide Dose: 11.0 mCi   Stress Radionuclide:  Technetium 59m Tetrofosmin  Stress Radionuclide Dose: 33.0 mCi           Stress Protocol Rest HR: 73 Stress HR: 102  Rest BP: 132/76 Stress BP: 146/81  Exercise Time (min): 2:00 METS: n/a   Predicted Max HR: 149 bpm % Max HR: 68.46 bpm Rate Pressure Product: 21308   Dose of Adenosine (mg):  n/a Dose of Lexiscan: 0.4 mg  Dose of Atropine (mg): n/a Dose of Dobutamine: n/a mcg/kg/min (at max HR)  Stress Test Technologist: Smiley Houseman, CMA-N  Nuclear Technologist:  Domenic Polite, CNMT     Rest Procedure:  Myocardial perfusion imaging was performed at rest 45 minutes following the intravenous administration of Technetium 22m  Tetrofosmin.  Rest ECG: No acute changes and occasional PVC's.  Stress Procedure:  The patient received IV Lexiscan 0.4 mg over 15-seconds with concurrent low level exercise and then Technetium 20m Tetrofosmin was injected at 30-seconds while the patient continued walking one more minute. There were no significant changes with Lexiscan bolus; frequent PVC's were noted. Quantitative spect images were obtained after a 45-minute delay.  Stress ECG: No significant change from baseline ECG  QPS Raw Data Images:  Normal; no motion artifact; normal heart/lung ratio. Stress Images:  Normal homogeneous uptake in all areas of the myocardium. Rest Images:  Normal homogeneous uptake in all areas of the myocardium. Subtraction (SDS):  No evidence of ischemia. Transient Ischemic Dilatation (Normal <1.22):  1.13 Lung/Heart Ratio (Normal <0.45):  0.25  Quantitative Gated Spect Images QGS EDV:  NA QGS ESV:  NA  Impression Exercise Capacity:  Lexiscan with low level exercise. BP Response:  Normal blood pressure response. Clinical Symptoms:  No significant symptoms noted. ECG Impression:  No significant ST segment change suggestive of ischemia. Comparison with Prior Nuclear Study: No previous nuclear study performed  Overall Impression:  Low risk stress nuclear study.  No evidence of ischemia.  The study could not be gated due to frequent PVCs.  No assessment of LV function.  LV Ejection Fraction: Study not gated.      [name], [name]., MD, [facility] 08/30/2011,  5:16 PM

## 2011-10-26 DIAGNOSIS — L03019 Cellulitis of unspecified finger: Secondary | ICD-10-CM | POA: Diagnosis not present

## 2011-10-26 DIAGNOSIS — E1149 Type 2 diabetes mellitus with other diabetic neurological complication: Secondary | ICD-10-CM | POA: Diagnosis not present

## 2011-10-26 DIAGNOSIS — L97509 Non-pressure chronic ulcer of other part of unspecified foot with unspecified severity: Secondary | ICD-10-CM | POA: Diagnosis not present

## 2011-11-20 ENCOUNTER — Encounter: Payer: Self-pay | Admitting: Family Medicine

## 2011-11-20 ENCOUNTER — Ambulatory Visit (INDEPENDENT_AMBULATORY_CARE_PROVIDER_SITE_OTHER): Payer: Medicare Other | Admitting: Family Medicine

## 2011-11-20 VITALS — BP 120/84 | Temp 98.0°F | Ht 70.5 in | Wt 235.0 lb

## 2011-11-20 DIAGNOSIS — M1990 Unspecified osteoarthritis, unspecified site: Secondary | ICD-10-CM | POA: Diagnosis not present

## 2011-11-20 DIAGNOSIS — IMO0001 Reserved for inherently not codable concepts without codable children: Secondary | ICD-10-CM | POA: Diagnosis not present

## 2011-11-20 DIAGNOSIS — I493 Ventricular premature depolarization: Secondary | ICD-10-CM

## 2011-11-20 DIAGNOSIS — E119 Type 2 diabetes mellitus without complications: Secondary | ICD-10-CM

## 2011-11-20 DIAGNOSIS — N139 Obstructive and reflux uropathy, unspecified: Secondary | ICD-10-CM | POA: Diagnosis not present

## 2011-11-20 DIAGNOSIS — N138 Other obstructive and reflux uropathy: Secondary | ICD-10-CM

## 2011-11-20 DIAGNOSIS — Z0000 Encounter for general adult medical examination without abnormal findings: Secondary | ICD-10-CM | POA: Diagnosis not present

## 2011-11-20 DIAGNOSIS — N401 Enlarged prostate with lower urinary tract symptoms: Secondary | ICD-10-CM | POA: Diagnosis not present

## 2011-11-20 DIAGNOSIS — E1165 Type 2 diabetes mellitus with hyperglycemia: Secondary | ICD-10-CM

## 2011-11-20 DIAGNOSIS — I10 Essential (primary) hypertension: Secondary | ICD-10-CM

## 2011-11-20 DIAGNOSIS — J309 Allergic rhinitis, unspecified: Secondary | ICD-10-CM | POA: Diagnosis not present

## 2011-11-20 LAB — PSA: PSA: 0.36 ng/mL (ref 0.10–4.00)

## 2011-11-20 LAB — HEPATIC FUNCTION PANEL
ALT: 23 U/L (ref 0–53)
AST: 25 U/L (ref 0–37)
Albumin: 4.1 g/dL (ref 3.5–5.2)
Alkaline Phosphatase: 45 U/L (ref 39–117)
Bilirubin, Direct: 0.2 mg/dL (ref 0.0–0.3)
Total Bilirubin: 0.9 mg/dL (ref 0.3–1.2)
Total Protein: 6.8 g/dL (ref 6.0–8.3)

## 2011-11-20 LAB — MICROALBUMIN / CREATININE URINE RATIO
Creatinine,U: 223.7 mg/dL
Microalb Creat Ratio: 0.5 mg/g (ref 0.0–30.0)
Microalb, Ur: 1.1 mg/dL (ref 0.0–1.9)

## 2011-11-20 LAB — LIPID PANEL
Cholesterol: 134 mg/dL (ref 0–200)
HDL: 36.1 mg/dL — ABNORMAL LOW (ref >39.00)
LDL Cholesterol: 70 mg/dL (ref 0–99)
Total CHOL/HDL Ratio: 4
Triglycerides: 140 mg/dL (ref 0.0–149.0)
VLDL: 28 mg/dL (ref 0.0–40.0)

## 2011-11-20 LAB — CBC WITH DIFFERENTIAL/PLATELET
Basophils Absolute: 0.1 10*3/uL (ref 0.0–0.1)
Basophils Relative: 1.2 % (ref 0.0–3.0)
Eosinophils Absolute: 0.3 10*3/uL (ref 0.0–0.7)
Eosinophils Relative: 4.7 % (ref 0.0–5.0)
HCT: 43.8 % (ref 39.0–52.0)
Hemoglobin: 14.5 g/dL (ref 13.0–17.0)
Lymphocytes Relative: 29.2 % (ref 12.0–46.0)
Lymphs Abs: 1.8 10*3/uL (ref 0.7–4.0)
MCHC: 33.2 g/dL (ref 30.0–36.0)
MCV: 98.3 fl (ref 78.0–100.0)
Monocytes Absolute: 0.5 10*3/uL (ref 0.1–1.0)
Monocytes Relative: 8.4 % (ref 3.0–12.0)
Neutro Abs: 3.4 10*3/uL (ref 1.4–7.7)
Neutrophils Relative %: 56.5 % (ref 43.0–77.0)
Platelets: 166 10*3/uL (ref 150.0–400.0)
RBC: 4.46 Mil/uL (ref 4.22–5.81)
RDW: 13.6 % (ref 11.5–14.6)
WBC: 6 10*3/uL (ref 4.5–10.5)

## 2011-11-20 LAB — POCT URINALYSIS DIPSTICK
Glucose, UA: NEGATIVE
Leukocytes, UA: NEGATIVE
Nitrite, UA: NEGATIVE
Protein, UA: NEGATIVE
Spec Grav, UA: 1.025
Urobilinogen, UA: 0.2
pH, UA: 5

## 2011-11-20 LAB — HEMOGLOBIN A1C: Hgb A1c MFr Bld: 5.6 % (ref 4.6–6.5)

## 2011-11-20 LAB — TSH: TSH: 1.24 u[IU]/mL (ref 0.35–5.50)

## 2011-11-20 LAB — BASIC METABOLIC PANEL
BUN: 15 mg/dL (ref 6–23)
CO2: 28 mEq/L (ref 19–32)
Calcium: 9.3 mg/dL (ref 8.4–10.5)
Chloride: 101 mEq/L (ref 96–112)
Creatinine, Ser: 0.6 mg/dL (ref 0.4–1.5)
GFR: 143.48 mL/min (ref >60.00)
Glucose, Bld: 108 mg/dL — ABNORMAL HIGH (ref 70–99)
Potassium: 3.9 mEq/L (ref 3.5–5.1)
Sodium: 139 mEq/L (ref 135–145)

## 2011-11-20 LAB — MAGNESIUM: Magnesium: 1.6 mg/dL (ref 1.5–2.5)

## 2011-11-20 MED ORDER — ATENOLOL-CHLORTHALIDONE 50-25 MG PO TABS: 1.0000 | ORAL_TABLET | Freq: Every day | ORAL | Status: DC

## 2011-11-20 MED ORDER — GLIPIZIDE 5 MG PO TABS: ORAL_TABLET | ORAL | Status: DC

## 2011-11-20 MED ORDER — GLUCOSE BLOOD VI STRP: ORAL_STRIP | Status: DC

## 2011-11-20 MED ORDER — POTASSIUM CHLORIDE CRYS ER 20 MEQ PO TBCR: 20.0000 meq | EXTENDED_RELEASE_TABLET | Freq: Three times a day (TID) | ORAL | Status: DC

## 2011-11-20 MED ORDER — METFORMIN HCL 1000 MG PO TABS: ORAL_TABLET | ORAL | Status: DC

## 2011-11-20 NOTE — Patient Instructions (Signed)
Continue your current medications  We will call you about your lab work and discuss any changes by phone

## 2011-11-20 NOTE — Progress Notes (Signed)
  Subjective:    Patient ID: [name], male    DOB: [birth year], 71 y.o.   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 Leta Jungling. He had a EKG which shows frequent PVCs he's currently asymptomatic. Cardiac workup including a stress test were normal. His magnesium was slightly low at 1.6 we will repeat that today  He takes Tenoretic 50-25 daily for hypertension BP 120/84. He takes glipizide 5 mg daily along with metformin 1000 mg twice a day for diabetes states his blood sugars at home are normal with check A1c today  He takes Claritin for allergic rhinitis and 320 mEq potassium supplements daily.  The biggest stressor in his life now is caring for his disabled wife. He continues to try to play golf with his friends  Cognitive function normal he walks on a daily basis home health safety reviewed no issues identified, he does have guns in the house but the locked up he was a former police captain he does have a health care power of attorney and living well. Tetanus 2008 Pneumovax x2 information given on shingles   Review of Systems  Constitutional: Negative.   HENT: Negative.   Eyes: Negative.   Respiratory: Negative.   Cardiovascular: Negative.   Gastrointestinal: Negative.   Genitourinary: Negative.   Musculoskeletal: Negative.   Skin: Negative.   Neurological: Negative.   Hematological: Negative.   Psychiatric/Behavioral: Negative.        Objective:   Physical Exam  Constitutional: He is oriented to person, place, and time. He appears well-developed and well-nourished.  HENT:  Head: Normocephalic and atraumatic.  Right Ear: External ear normal.  Left Ear: External ear normal.  Nose: Nose normal.  Mouth/Throat: Oropharynx is clear and moist.  Eyes: Conjunctivae and EOM are normal. Pupils are equal, round, and reactive to  light.  Neck: Normal range of motion. Neck supple. No JVD present. No tracheal deviation present. No thyromegaly present.  Cardiovascular: Normal rate, regular rhythm, normal heart sounds and intact distal pulses.  Exam reveals no gallop and no friction rub.   No murmur heard. Pulmonary/Chest: Effort normal and breath sounds normal. No stridor. No respiratory distress. He has no wheezes. He has no rales. He exhibits no tenderness.  Abdominal: Soft. Bowel sounds are normal. He exhibits no distension and no mass. There is no tenderness. There is no rebound and no guarding.  Genitourinary: Rectum normal, prostate normal and penis normal. Guaiac negative stool. No penile tenderness.  Musculoskeletal: Normal range of motion. He exhibits no edema and no tenderness.  Lymphadenopathy:    He has no cervical adenopathy.  Neurological: He is alert and oriented to person, place, and time. He has normal reflexes. No cranial nerve deficit. He exhibits normal muscle tone.  Skin: Skin is warm and dry. No rash noted. No erythema. No pallor.  Psychiatric: He has a normal mood and affect. His behavior is normal. Judgment and thought content normal.          Assessment & Plan:  Healthy male  Hypertension continue current medication  Diabetes type 2 continue current medication check labs  Allergic rhinitis switch to plain Zyrtec each bedtime

## 2011-11-23 ENCOUNTER — Telehealth: Payer: Self-pay | Admitting: Family Medicine

## 2011-11-23 DIAGNOSIS — E119 Type 2 diabetes mellitus without complications: Secondary | ICD-10-CM

## 2011-11-23 MED ORDER — GLUCOSE BLOOD VI STRP: ORAL_STRIP | Status: DC

## 2011-11-23 MED ORDER — ACCU-CHEK MULTICLIX LANCETS MISC: Status: DC

## 2011-11-23 NOTE — Telephone Encounter (Signed)
Pt needs new script called into [facility] for glucose blood (ACCU-CHEK AVIVA) test strip. rx was called in with a quantity of 100 with 5 refills, medicare will not cover 100. Pt requesting a new script with a quantity of 50 with 10 refills. Pt also states he needs a rx sent in for his Lancets.  Pt would like to be contacted when this is complete

## 2011-11-23 NOTE — Telephone Encounter (Signed)
rx sent

## 2011-12-06 ENCOUNTER — Ambulatory Visit (INDEPENDENT_AMBULATORY_CARE_PROVIDER_SITE_OTHER): Payer: Medicare Other | Admitting: Family Medicine

## 2011-12-06 ENCOUNTER — Encounter: Payer: Self-pay | Admitting: Family Medicine

## 2011-12-06 VITALS — BP 120/84 | Temp 98.3°F | Wt 239.0 lb

## 2011-12-06 DIAGNOSIS — J45901 Unspecified asthma with (acute) exacerbation: Secondary | ICD-10-CM | POA: Diagnosis not present

## 2011-12-06 MED ORDER — PREDNISONE 20 MG PO TABS: ORAL_TABLET | ORAL | Status: DC

## 2011-12-06 MED ORDER — ALBUTEROL SULFATE HFA 108 (90 BASE) MCG/ACT IN AERS: INHALATION_SPRAY | RESPIRATORY_TRACT | Status: DC

## 2011-12-06 NOTE — Progress Notes (Signed)
  Subjective:    Patient ID: [name], male    DOB: 1941-01-26, 71 y.o.   MRN: [id number]  HPI [name] is a 71 year old male who comes in today for evaluation of a cough for one year  He's had a history of allergic rhinitis and asthma and has been coughing for a year. He sleeps well at night no shortness of breath. He's taken his antihistamine on a regular basis but no change in his symptoms. At one time during year he went to the emergency room for this problem and was given a nebulizer with albuterol that cleared his symptoms. Place cough no angina. X. smoker and is concerned is lung cancer general and pulmonary review of systems negative no hemoptysis weight loss etc.   Review of Systems Gen. review of systems negative pulmonary review of systems negative specifically no weight loss or hemoptysis etc.    Objective:   Physical Exam Well-developed well-nourished male in no acute distress HEENT negative neck was supple no adenopathy lungs are clear except for symmetrical bilateral expiratory wheezing mild       Assessment & Plan:  Asthma plan prednisone burst and taper chest x-ray to reassure patient also had albuterol 1 puff twice a day

## 2011-12-06 NOTE — Patient Instructions (Signed)
Begin a prednisone take 2 now then 2 tabs x3 days then taper as outlined  Go to the main office tomorrow for a chest x-ray  Albuterol 1 puff twice daily  Return in 3 weeks for followup

## 2011-12-07 ENCOUNTER — Ambulatory Visit (INDEPENDENT_AMBULATORY_CARE_PROVIDER_SITE_OTHER)
Admission: RE | Admit: 2011-12-07 | Discharge: 2011-12-07 | Disposition: A | Discharge status: Home / Self Care | Payer: Medicare Other | Source: Ambulatory Visit | Attending: Family Medicine | Admitting: Family Medicine

## 2011-12-07 DIAGNOSIS — R079 Chest pain, unspecified: Secondary | ICD-10-CM | POA: Diagnosis not present

## 2011-12-07 DIAGNOSIS — J45901 Unspecified asthma with (acute) exacerbation: Secondary | ICD-10-CM | POA: Diagnosis not present

## 2011-12-07 DIAGNOSIS — R0989 Other specified symptoms and signs involving the circulatory and respiratory systems: Secondary | ICD-10-CM | POA: Diagnosis not present

## 2011-12-27 ENCOUNTER — Ambulatory Visit (INDEPENDENT_AMBULATORY_CARE_PROVIDER_SITE_OTHER): Payer: Medicare Other | Admitting: Family Medicine

## 2011-12-27 ENCOUNTER — Encounter: Payer: Self-pay | Admitting: Family Medicine

## 2011-12-27 VITALS — BP 120/80 | Temp 99.1°F | Wt 235.0 lb

## 2011-12-27 DIAGNOSIS — J45901 Unspecified asthma with (acute) exacerbation: Secondary | ICD-10-CM | POA: Diagnosis not present

## 2011-12-27 NOTE — Patient Instructions (Addendum)
Take the prednisone one half tablet Monday Wednesday Friday for 4 weeks return 2 weeks after being off the prednisone completely

## 2011-12-27 NOTE — Progress Notes (Signed)
  Subjective:    Patient ID: [name], male    DOB: 06-15-1940, 71 y.o.   MRN: [id number]  HPI [name] is a 71 year old married male nonsmoker who comes in today for followup of asthma  He has a history of underlying allergic rhinitis and asthma and had a flare about a month ago. We tapered his prednisone he took his last half tablet last Friday. However he did not follow the directions and only took a half a tablet Monday Wednesday Friday for one week. He states he feels about 50% better.  He states that since he started the prednisone his blood sugars been under better control????????????? blood sugar and prednisone as around the 100 range off the prednisone he says it goes up to 140????????   Review of Systems General and pulmonary review of systems otherwise negative    Objective:   Physical Exam  Well-developed well-nourished male in no acute distress lungs are clear except for some very feet late expiratory wheezing with forced expiration      Assessment & Plan:  Asthma improved plan Taper slowly return when necessary

## 2012-01-09 DIAGNOSIS — E1149 Type 2 diabetes mellitus with other diabetic neurological complication: Secondary | ICD-10-CM | POA: Diagnosis not present

## 2012-01-09 DIAGNOSIS — L03019 Cellulitis of unspecified finger: Secondary | ICD-10-CM | POA: Diagnosis not present

## 2012-01-09 DIAGNOSIS — L97509 Non-pressure chronic ulcer of other part of unspecified foot with unspecified severity: Secondary | ICD-10-CM | POA: Diagnosis not present

## 2012-01-29 ENCOUNTER — Other Ambulatory Visit: Payer: Self-pay | Admitting: Dermatology

## 2012-01-29 DIAGNOSIS — C44319 Basal cell carcinoma of skin of other parts of face: Secondary | ICD-10-CM | POA: Diagnosis not present

## 2012-01-29 DIAGNOSIS — L821 Other seborrheic keratosis: Secondary | ICD-10-CM | POA: Diagnosis not present

## 2012-01-29 DIAGNOSIS — Z85828 Personal history of other malignant neoplasm of skin: Secondary | ICD-10-CM | POA: Diagnosis not present

## 2012-01-29 DIAGNOSIS — L570 Actinic keratosis: Secondary | ICD-10-CM | POA: Diagnosis not present

## 2012-02-05 DIAGNOSIS — E1149 Type 2 diabetes mellitus with other diabetic neurological complication: Secondary | ICD-10-CM | POA: Diagnosis not present

## 2012-02-05 DIAGNOSIS — L89609 Pressure ulcer of unspecified heel, unspecified stage: Secondary | ICD-10-CM | POA: Diagnosis not present

## 2012-02-06 ENCOUNTER — Ambulatory Visit (INDEPENDENT_AMBULATORY_CARE_PROVIDER_SITE_OTHER): Payer: Medicare Other | Admitting: Family Medicine

## 2012-02-06 ENCOUNTER — Encounter: Payer: Self-pay | Admitting: Family Medicine

## 2012-02-06 VITALS — BP 110/80 | Temp 98.2°F | Wt 237.0 lb

## 2012-02-06 DIAGNOSIS — J45901 Unspecified asthma with (acute) exacerbation: Secondary | ICD-10-CM | POA: Diagnosis not present

## 2012-02-06 DIAGNOSIS — E1165 Type 2 diabetes mellitus with hyperglycemia: Secondary | ICD-10-CM

## 2012-02-06 DIAGNOSIS — IMO0001 Reserved for inherently not codable concepts without codable children: Secondary | ICD-10-CM | POA: Diagnosis not present

## 2012-02-06 MED ORDER — BECLOMETHASONE DIPROPIONATE 40 MCG/ACT IN AERS: INHALATION_SPRAY | RESPIRATORY_TRACT | Status: DC

## 2012-02-06 NOTE — Patient Instructions (Signed)
Begin the Qvar one puff twice daily  Followup in 2 months sooner if any problems  Remember to rinse and spit after using the Qvar

## 2012-02-06 NOTE — Progress Notes (Signed)
  Subjective:    Patient ID: [name], male    DOB: 09-21-1940, 71 y.o.   MRN: [id number]  HPI [name] is a 71 year old married male nonsmoker who comes in today for followup of asthma and diabetes  He's been off his prednisone now for 2 weeks he states his wheezing is about 90% improved. He still has to use the albuterol occasionally but  His blood sugars in the 120 range last A1c 2 months ago was 5.6%   Review of Systems    general pulmonary review of systems otherwise negative Objective:   Physical Exam Well-developed well-nourished male in no acute distress HEENT negative neck was supple no adenopathy lungs are clear except for some symmetrical very mild late expiratory wheezing       Assessment & Plan:  Asthma resolving plan start on Qvar 1 puff twice a day  Diabetes type 2 at goal continue current therapy

## 2012-03-17 ENCOUNTER — Telehealth: Payer: Self-pay | Admitting: Family Medicine

## 2012-03-17 MED ORDER — ACCU-CHEK MULTICLIX LANCETS MISC: 1.0000 | Freq: Every day | Status: DC

## 2012-03-17 NOTE — Telephone Encounter (Signed)
Rx ready for pick up. 

## 2012-03-17 NOTE — Telephone Encounter (Signed)
Requesting a paper script of Accucheck multi clix lancets.  Pt's wife states [facility] on [address] told them this request can not be called in or sent electronically due to insurance requirements.

## 2012-03-26 DIAGNOSIS — C44319 Basal cell carcinoma of skin of other parts of face: Secondary | ICD-10-CM | POA: Diagnosis not present

## 2012-04-08 ENCOUNTER — Ambulatory Visit (INDEPENDENT_AMBULATORY_CARE_PROVIDER_SITE_OTHER): Payer: Medicare Other | Admitting: Family Medicine

## 2012-04-08 ENCOUNTER — Encounter: Payer: Self-pay | Admitting: Family Medicine

## 2012-04-08 VITALS — BP 140/90 | Temp 98.1°F | Wt 240.0 lb

## 2012-04-08 DIAGNOSIS — Z23 Encounter for immunization: Secondary | ICD-10-CM | POA: Diagnosis not present

## 2012-04-08 DIAGNOSIS — J45901 Unspecified asthma with (acute) exacerbation: Secondary | ICD-10-CM | POA: Diagnosis not present

## 2012-04-08 NOTE — Progress Notes (Signed)
  Subjective:    Patient ID: [name], male    DOB: Jun 04, 1940, 71 y.o.   MRN: [id number]  HPI [name] is a  71 year old male married who comes in today for evaluation of asthma  He's on Qvar 40 dose one puff daily. He states he can't remember to take the evening dose. He's having to use the albuterol once or twice weekly  Blood sugar today in the 120 range  Blood pressure 140/90 he wants his shingles vexing    Review of Systems General and pulmonary review of systems otherwise negative    Objective:   Physical Exam Well-developed well-nourished male in no acute distress cardiopulmonary exam normal except for very mild late expiratory wheezing on forced expiration       Assessment & Plan:  Asthma increase the Qvar 2 puffs twice daily followup when necessary  Shingles vexing today

## 2012-04-08 NOTE — Patient Instructions (Signed)
Increase the Qvar 40,,,,,,,,,,,,,,, 2 puffs twice daily  Return when necessary

## 2012-04-30 DIAGNOSIS — E119 Type 2 diabetes mellitus without complications: Secondary | ICD-10-CM | POA: Diagnosis not present

## 2012-04-30 DIAGNOSIS — H2510 Age-related nuclear cataract, unspecified eye: Secondary | ICD-10-CM | POA: Diagnosis not present

## 2012-04-30 LAB — HM DIABETES EYE EXAM

## 2012-05-01 ENCOUNTER — Encounter: Payer: Self-pay | Admitting: Family Medicine

## 2012-06-03 DIAGNOSIS — L821 Other seborrheic keratosis: Secondary | ICD-10-CM | POA: Diagnosis not present

## 2012-06-03 DIAGNOSIS — Z85828 Personal history of other malignant neoplasm of skin: Secondary | ICD-10-CM | POA: Diagnosis not present

## 2012-06-03 DIAGNOSIS — L723 Sebaceous cyst: Secondary | ICD-10-CM | POA: Diagnosis not present

## 2012-10-08 DIAGNOSIS — F4322 Adjustment disorder with anxiety: Secondary | ICD-10-CM | POA: Diagnosis not present

## 2012-10-09 ENCOUNTER — Ambulatory Visit (INDEPENDENT_AMBULATORY_CARE_PROVIDER_SITE_OTHER): Payer: Medicare Other | Admitting: Family Medicine

## 2012-10-09 ENCOUNTER — Encounter: Payer: Self-pay | Admitting: Family Medicine

## 2012-10-09 VITALS — BP 110/70 | Temp 98.7°F | Wt 240.0 lb

## 2012-10-09 DIAGNOSIS — M546 Pain in thoracic spine: Secondary | ICD-10-CM | POA: Diagnosis not present

## 2012-10-09 LAB — POCT URINALYSIS DIPSTICK
Bilirubin, UA: NEGATIVE
Blood, UA: NEGATIVE
Glucose, UA: NEGATIVE
Ketones, UA: NEGATIVE
Leukocytes, UA: NEGATIVE
Nitrite, UA: NEGATIVE
Protein, UA: NEGATIVE
Spec Grav, UA: <=1.005
Urobilinogen, UA: 0.2
pH, UA: 8

## 2012-10-09 MED ORDER — TRAMADOL HCL 50 MG PO TABS: ORAL_TABLET | ORAL | Status: DC

## 2012-10-09 NOTE — Progress Notes (Signed)
  Subjective:    Patient ID: [name], male    DOB: 31-May-1940, 72 y.o.   MRN: [id number]  HPI  [name] is a 72 year old married male nonsmoker who comes in today for evaluation of right lumbar back pain for 3 weeks  He states his back has been bothering for about 3 weeks. He does not recall any history of trauma. He says the pain comes and goes. Sometimes last for a short period time last night it lasted for about 5 hours. He takes a couple aspirin for the pain. He says on a scale of 1-10 last night it was a 7. It does not radiate.  In 2002 he had kidney stones which seem to cause similar pain.  He has underlying hypertension and diabetes  Both his blood sugar and blood pressure under good control  Review of Systems    review of systems otherwise negative Objective:   Physical Exam  Well-developed well-nourished male no acute distress examination the spine shows no palpable tenderness. In the supine position both legs were of equal length. Sensation muscle strength reflexes are within normal limits. Straight leg raising negative.  Urinalysis normal      Assessment & Plan:  Lumbar disc disc disease..........Marland Kitchen Motrin 400 twice a day tramadol 50 mg 3 times a day when necessary for

## 2012-10-09 NOTE — Patient Instructions (Signed)
Motrin 400 mg twice daily as needed for mild pain  Tramadol 50 mg..... One half to one tablet twice daily when necessary for severe pain

## 2012-10-13 ENCOUNTER — Other Ambulatory Visit: Payer: Self-pay | Admitting: *Deleted

## 2012-10-13 MED ORDER — ACCU-CHEK MULTICLIX LANCETS MISC: 1.0000 | Freq: Every day | Status: DC

## 2012-11-30 ENCOUNTER — Other Ambulatory Visit: Payer: Self-pay | Admitting: Family Medicine

## 2012-12-09 DIAGNOSIS — M7130 Other bursal cyst, unspecified site: Secondary | ICD-10-CM | POA: Diagnosis not present

## 2012-12-09 DIAGNOSIS — L821 Other seborrheic keratosis: Secondary | ICD-10-CM | POA: Diagnosis not present

## 2012-12-09 DIAGNOSIS — L570 Actinic keratosis: Secondary | ICD-10-CM | POA: Diagnosis not present

## 2012-12-09 DIAGNOSIS — Z85828 Personal history of other malignant neoplasm of skin: Secondary | ICD-10-CM | POA: Diagnosis not present

## 2012-12-09 DIAGNOSIS — L820 Inflamed seborrheic keratosis: Secondary | ICD-10-CM | POA: Diagnosis not present

## 2012-12-24 ENCOUNTER — Encounter: Payer: Medicare Other | Admitting: Family Medicine

## 2012-12-31 ENCOUNTER — Other Ambulatory Visit: Payer: Self-pay | Admitting: Family Medicine

## 2013-01-05 ENCOUNTER — Other Ambulatory Visit: Payer: Self-pay | Admitting: Family Medicine

## 2013-01-13 ENCOUNTER — Ambulatory Visit (INDEPENDENT_AMBULATORY_CARE_PROVIDER_SITE_OTHER): Payer: Medicare Other | Admitting: Internal Medicine

## 2013-01-13 ENCOUNTER — Encounter: Payer: Self-pay | Admitting: Internal Medicine

## 2013-01-13 VITALS — BP 130/80 | HR 61 | Temp 98.5°F | Resp 20 | Ht 69.5 in | Wt 236.0 lb

## 2013-01-13 DIAGNOSIS — M1990 Unspecified osteoarthritis, unspecified site: Secondary | ICD-10-CM

## 2013-01-13 DIAGNOSIS — I10 Essential (primary) hypertension: Secondary | ICD-10-CM | POA: Diagnosis not present

## 2013-01-13 DIAGNOSIS — E1165 Type 2 diabetes mellitus with hyperglycemia: Secondary | ICD-10-CM

## 2013-01-13 DIAGNOSIS — J309 Allergic rhinitis, unspecified: Secondary | ICD-10-CM

## 2013-01-13 DIAGNOSIS — IMO0001 Reserved for inherently not codable concepts without codable children: Secondary | ICD-10-CM

## 2013-01-13 DIAGNOSIS — I4949 Other premature depolarization: Secondary | ICD-10-CM

## 2013-01-13 DIAGNOSIS — Z0000 Encounter for general adult medical examination without abnormal findings: Secondary | ICD-10-CM | POA: Diagnosis not present

## 2013-01-13 DIAGNOSIS — I493 Ventricular premature depolarization: Secondary | ICD-10-CM

## 2013-01-13 LAB — COMPREHENSIVE METABOLIC PANEL
ALT: 26 U/L (ref 0–53)
AST: 22 U/L (ref 0–37)
Albumin: 4.2 g/dL (ref 3.5–5.2)
Alkaline Phosphatase: 46 U/L (ref 39–117)
BUN: 16 mg/dL (ref 6–23)
CO2: 29 mEq/L (ref 19–32)
Calcium: 9.4 mg/dL (ref 8.4–10.5)
Chloride: 102 mEq/L (ref 96–112)
Creatinine, Ser: 0.7 mg/dL (ref 0.4–1.5)
GFR: 113.66 mL/min (ref >60.00)
Glucose, Bld: 71 mg/dL (ref 70–99)
Potassium: 3.6 mEq/L (ref 3.5–5.1)
Sodium: 138 mEq/L (ref 135–145)
Total Bilirubin: 0.8 mg/dL (ref 0.3–1.2)
Total Protein: 7.2 g/dL (ref 6.0–8.3)

## 2013-01-13 LAB — CBC WITH DIFFERENTIAL/PLATELET
Basophils Absolute: 0.1 10*3/uL (ref 0.0–0.1)
Basophils Relative: 1.3 % (ref 0.0–3.0)
Eosinophils Absolute: 0.3 10*3/uL (ref 0.0–0.7)
Eosinophils Relative: 4.1 % (ref 0.0–5.0)
HCT: 41.6 % (ref 39.0–52.0)
Hemoglobin: 14.2 g/dL (ref 13.0–17.0)
Lymphocytes Relative: 28.9 % (ref 12.0–46.0)
Lymphs Abs: 1.9 10*3/uL (ref 0.7–4.0)
MCHC: 34.2 g/dL (ref 30.0–36.0)
MCV: 96.5 fl (ref 78.0–100.0)
Monocytes Absolute: 0.7 10*3/uL (ref 0.1–1.0)
Monocytes Relative: 10 % (ref 3.0–12.0)
Neutro Abs: 3.6 10*3/uL (ref 1.4–7.7)
Neutrophils Relative %: 55.7 % (ref 43.0–77.0)
Platelets: 179 10*3/uL (ref 150.0–400.0)
RBC: 4.31 Mil/uL (ref 4.22–5.81)
RDW: 13.1 % (ref 11.5–14.6)
WBC: 6.6 10*3/uL (ref 4.5–10.5)

## 2013-01-13 LAB — LIPID PANEL
Cholesterol: 140 mg/dL (ref 0–200)
HDL: 42.5 mg/dL (ref >39.00)
LDL Cholesterol: 68 mg/dL (ref 0–99)
Total CHOL/HDL Ratio: 3
Triglycerides: 147 mg/dL (ref 0.0–149.0)
VLDL: 29.4 mg/dL (ref 0.0–40.0)

## 2013-01-13 LAB — MICROALBUMIN / CREATININE URINE RATIO
Creatinine,U: 105.8 mg/dL
Microalb Creat Ratio: 1 mg/g (ref 0.0–30.0)
Microalb, Ur: 1.1 mg/dL (ref 0.0–1.9)

## 2013-01-13 LAB — HEMOGLOBIN A1C: Hgb A1c MFr Bld: 5.4 % (ref 4.6–6.5)

## 2013-01-13 LAB — TSH: TSH: 1.12 u[IU]/mL (ref 0.35–5.50)

## 2013-01-13 MED ORDER — GLIPIZIDE 5 MG PO TABS: ORAL_TABLET | ORAL | Status: DC

## 2013-01-13 NOTE — Progress Notes (Signed)
Subjective:    Patient ID: [name], male    DOB: 1940-06-29, 72 y.o.   MRN: [id number]  HPI   72 year old patient who is seen today for a preventive health examination. He is followed by cardiology due 2 PVCs. Last year he had a 2-D echocardiogram as well as a nuclear stress test that were unremarkable. He has a history of type 2 diabetes cough. By peripheral neuropathy. He has been well-controlled on metformin therapy. He has osteoarthritis with back and some generalized arthritic discomfort. He does remain active with golf at least 2 times per week. He has treated hypertension which has been stable.  Past Medical History  Diagnosis Date  . ED (erectile dysfunction)   . Rhinitis   . Hypertension   . Osteoarthritis   . CAD (coronary artery disease)     Minimal plaque cath 2008  . Rheumatic fever   . Diabetes mellitus   . Recurrent kidney stones     History   Social History  . Marital Status: Married    Spouse Name: N/A    Number of Children: 1  . Years of Education: N/A   Occupational History  .     Social History Main Topics  . Smoking status: Former Smoker    Quit date: 07/26/1981  . Smokeless tobacco: Not on file  . Alcohol Use: Yes  . Drug Use: No  . Sexual Activity: Not on file   Other Topics Concern  . Not on file   Social History Narrative   Lives with wife.    Past Surgical History  Procedure Laterality Date  . Inguinal hernia repair    . Colonoscopy  2006, 2010    Family History  Problem Relation Age of Onset  . Stroke Father 75    Died age 79  . Coronary artery disease Brother 1    Allergies  Allergen Reactions  . Penicillins     REACTION: nausea, vomit    Current Outpatient Prescriptions on File Prior to Visit  Medication Sig Dispense Refill  . aspirin 81 MG tablet Take 81 mg by mouth daily.        Marland Kitchen atenolol-chlorthalidone (TENORETIC) 50-25 MG per tablet Take one tablet by mouth one time daily  90 tablet  2  . glucose blood  (ACCU-CHEK AVIVA) test strip Use once daily 250.02 dx  50 each  10  . Lancets (ACCU-CHEK MULTICLIX) lancets 1 each by Other route daily. Dx 25.00  102 each  3  . loratadine (CLARITIN) 10 MG tablet Take 10 mg by mouth daily.      . metFORMIN (GLUCOPHAGE) 1000 MG tablet Take one tablet by mouth twice daily  180 tablet  2  . potassium chloride SA (K-DUR,KLOR-CON) 20 MEQ tablet TAKE ONE TABLET BY MOUTH THREE TIMES DAILY  270 tablet  0  . traMADol (ULTRAM) 50 MG tablet One half to one tablet twice daily when necessary for severe back pain  60 tablet  2  . albuterol (PROVENTIL HFA;VENTOLIN HFA) 108 (90 BASE) MCG/ACT inhaler 1 puff twice a day  1 Inhaler  2   No current facility-administered medications on file prior to visit.    BP 130/80  Pulse 61  Temp(Src) 98.5 F (36.9 C) (Oral)  Resp 20  Ht 5' 9.5" (1.765 m)  Wt 236 lb (107.049 kg)  BMI 34.36 kg/m2  SpO2 97%   1. Risk factors, based on past  M,S,F history    CV risk factors include HTN;  h/o low risk nuclear stress test  2.  Physical activities:  Active with golf  3.  Depression/mood: none  4.  Hearing: no deficits  5.  ADL's: independent  6.  Fall risk:low   7.  Home safety: none id  8.  Height weight, and visual acuity; stable; recent eye exam  9.  Counseling: wt loss, heart healthy diet  10. Lab orders based on risk factors: ordered  11. Referral : none appropriate  12. Care plan: lifestyle issues discussed  13. Cognitive assessment:  Alert and oriented with nl affect      Review of Systems  Constitutional: Negative for fever, chills, activity change, appetite change and fatigue.  HENT: Negative for hearing loss, ear pain, congestion, rhinorrhea, sneezing, mouth sores, trouble swallowing, neck pain, neck stiffness, dental problem, voice change, sinus pressure and tinnitus.   Eyes: Negative for photophobia, pain, redness and visual disturbance.  Respiratory: Negative for apnea, cough, choking, chest tightness,  shortness of breath and wheezing.   Cardiovascular: Negative for chest pain, palpitations and leg swelling.  Gastrointestinal: Negative for nausea, vomiting, abdominal pain, diarrhea, constipation, blood in stool, abdominal distention, anal bleeding and rectal pain.  Genitourinary: Negative for dysuria, urgency, frequency, hematuria, flank pain, decreased urine volume, discharge, penile swelling, scrotal swelling, difficulty urinating, genital sores and testicular pain.  Musculoskeletal: Positive for arthralgias. Negative for myalgias, back pain, joint swelling and gait problem.  Skin: Negative for color change, rash and wound.  Neurological: Negative for dizziness, tremors, seizures, syncope, facial asymmetry, speech difficulty, weakness, light-headedness, numbness and headaches.  Hematological: Negative for adenopathy. Does not bruise/bleed easily.  Psychiatric/Behavioral: Negative for suicidal ideas, hallucinations, behavioral problems, confusion, sleep disturbance, self-injury, dysphoric mood, decreased concentration and agitation. The patient is not nervous/anxious.        Objective:   Physical Exam  Constitutional: He appears well-developed and well-nourished.  HENT:  Head: Normocephalic and atraumatic.  Right Ear: External ear normal.  Left Ear: External ear normal.  Nose: Nose normal.  Mouth/Throat: Oropharynx is clear and moist.  Eyes: Conjunctivae and EOM are normal. Pupils are equal, round, and reactive to light. No scleral icterus.  Neck: Normal range of motion. Neck supple. No JVD present. No thyromegaly present.  Cardiovascular: Regular rhythm and normal heart sounds.  Exam reveals no gallop and no friction rub.   No murmur heard. Pedal pulses faint  Pulmonary/Chest: Effort normal and breath sounds normal. He exhibits no tenderness.  Abdominal: Soft. Bowel sounds are normal. He exhibits no distension and no mass. There is no tenderness.  Genitourinary: Prostate normal and  penis normal.  Musculoskeletal: Normal range of motion. He exhibits edema. He exhibits no tenderness.  +2 ankle and pedal edema  Lymphadenopathy:    He has no cervical adenopathy.  Neurological: He is alert. He has normal reflexes. No cranial nerve deficit. Coordination normal.  Decreased vibratory sensation and monofilament testing involving the feet  Skin: Skin is warm and dry. No rash noted.  Psychiatric: He has a normal mood and affect. His behavior is normal.          Assessment & Plan:    Prev health exam DM2 Diabetic neuropathy HTN

## 2013-01-13 NOTE — Patient Instructions (Signed)
Please check your hemoglobin A1c every 3 months    It is important that you exercise regularly, at least 20 minutes 3 to 4 times per week.  If you develop chest pain or shortness of breath seek  medical attention.  You need to lose weight.  Consider a lower calorie diet and regular exercise.  Limit your sodium (Salt) intake  Please check your blood pressure on a regular basis.  If it is consistently greater than 150/90, please make an office appointment.  Laboratory studies were obtained on your visit today and will be available in one or 2 business days.  IF there are any significant abnormalities, the office will call and discuss the results.  If there results are unremarkable, they will be available for your review after one week in MY CHART.

## 2013-02-22 ENCOUNTER — Other Ambulatory Visit: Payer: Self-pay | Admitting: Family Medicine

## 2013-03-06 ENCOUNTER — Other Ambulatory Visit: Payer: Self-pay | Admitting: *Deleted

## 2013-03-06 DIAGNOSIS — E119 Type 2 diabetes mellitus without complications: Secondary | ICD-10-CM

## 2013-03-06 MED ORDER — GLUCOSE BLOOD VI STRP: ORAL_STRIP | Status: DC

## 2013-05-01 DIAGNOSIS — H52209 Unspecified astigmatism, unspecified eye: Secondary | ICD-10-CM | POA: Diagnosis not present

## 2013-05-01 DIAGNOSIS — H2510 Age-related nuclear cataract, unspecified eye: Secondary | ICD-10-CM | POA: Diagnosis not present

## 2013-05-01 DIAGNOSIS — H524 Presbyopia: Secondary | ICD-10-CM | POA: Diagnosis not present

## 2013-05-01 DIAGNOSIS — E119 Type 2 diabetes mellitus without complications: Secondary | ICD-10-CM | POA: Diagnosis not present

## 2013-05-01 LAB — HM DIABETES EYE EXAM

## 2013-05-06 ENCOUNTER — Encounter: Payer: Self-pay | Admitting: Family Medicine

## 2013-05-23 ENCOUNTER — Other Ambulatory Visit: Payer: Self-pay | Admitting: Family Medicine

## 2013-05-25 ENCOUNTER — Other Ambulatory Visit: Payer: Self-pay | Admitting: Family Medicine

## 2013-06-09 DIAGNOSIS — D1801 Hemangioma of skin and subcutaneous tissue: Secondary | ICD-10-CM | POA: Diagnosis not present

## 2013-06-09 DIAGNOSIS — L821 Other seborrheic keratosis: Secondary | ICD-10-CM | POA: Diagnosis not present

## 2013-06-09 DIAGNOSIS — L219 Seborrheic dermatitis, unspecified: Secondary | ICD-10-CM | POA: Diagnosis not present

## 2013-06-09 DIAGNOSIS — Z85828 Personal history of other malignant neoplasm of skin: Secondary | ICD-10-CM | POA: Diagnosis not present

## 2013-06-09 DIAGNOSIS — L570 Actinic keratosis: Secondary | ICD-10-CM | POA: Diagnosis not present

## 2013-07-20 DIAGNOSIS — L97509 Non-pressure chronic ulcer of other part of unspecified foot with unspecified severity: Secondary | ICD-10-CM | POA: Diagnosis not present

## 2013-07-20 DIAGNOSIS — M86179 Other acute osteomyelitis, unspecified ankle and foot: Secondary | ICD-10-CM | POA: Diagnosis not present

## 2013-07-20 DIAGNOSIS — M1710 Unilateral primary osteoarthritis, unspecified knee: Secondary | ICD-10-CM | POA: Diagnosis not present

## 2013-07-20 DIAGNOSIS — E1149 Type 2 diabetes mellitus with other diabetic neurological complication: Secondary | ICD-10-CM | POA: Diagnosis not present

## 2013-07-28 DIAGNOSIS — M869 Osteomyelitis, unspecified: Secondary | ICD-10-CM | POA: Diagnosis not present

## 2013-07-28 DIAGNOSIS — L97509 Non-pressure chronic ulcer of other part of unspecified foot with unspecified severity: Secondary | ICD-10-CM | POA: Diagnosis not present

## 2013-07-28 DIAGNOSIS — M86179 Other acute osteomyelitis, unspecified ankle and foot: Secondary | ICD-10-CM | POA: Diagnosis not present

## 2013-08-17 ENCOUNTER — Other Ambulatory Visit: Payer: Self-pay | Admitting: *Deleted

## 2013-08-17 MED ORDER — ACCU-CHEK MULTICLIX LANCETS MISC: 1.0000 | Freq: Every day | Status: DC

## 2013-10-03 ENCOUNTER — Other Ambulatory Visit: Payer: Self-pay | Admitting: Family Medicine

## 2013-10-06 ENCOUNTER — Other Ambulatory Visit: Payer: Self-pay | Admitting: Family Medicine

## 2013-10-24 ENCOUNTER — Encounter: Payer: Self-pay | Admitting: Family Medicine

## 2013-10-24 ENCOUNTER — Ambulatory Visit (INDEPENDENT_AMBULATORY_CARE_PROVIDER_SITE_OTHER): Payer: Medicare Other | Admitting: Family Medicine

## 2013-10-24 VITALS — BP 126/82 | Temp 98.5°F | Wt 232.0 lb

## 2013-10-24 DIAGNOSIS — L02619 Cutaneous abscess of unspecified foot: Secondary | ICD-10-CM | POA: Diagnosis not present

## 2013-10-24 DIAGNOSIS — L03039 Cellulitis of unspecified toe: Secondary | ICD-10-CM | POA: Diagnosis not present

## 2013-10-24 DIAGNOSIS — L03031 Cellulitis of right toe: Secondary | ICD-10-CM

## 2013-10-24 DIAGNOSIS — IMO0001 Reserved for inherently not codable concepts without codable children: Secondary | ICD-10-CM | POA: Diagnosis not present

## 2013-10-24 DIAGNOSIS — IMO0002 Reserved for concepts with insufficient information to code with codable children: Secondary | ICD-10-CM

## 2013-10-24 DIAGNOSIS — E1165 Type 2 diabetes mellitus with hyperglycemia: Secondary | ICD-10-CM

## 2013-10-24 MED ORDER — CEPHALEXIN 500 MG PO CAPS: ORAL_CAPSULE | ORAL | Status: DC

## 2013-10-24 NOTE — Progress Notes (Signed)
   Subjective:    Patient ID: [name], male    DOB: 05-Nov-1940, 73 y.o.   MRN: [id number]  HPI [name] is a 73 year old married male nonsmoker who has underlying diabetes type 2 who comes in today with a week's history of swelling and redness of the fourth toe of his right foot  In March of this year he had the third toe of his right foot removed by Dr. Sharol Given because of infection. He did well and had no problems until about 10 days ago he went to the beach and playing golf 4 days in a row. When he got back he noticed a swelling and redness of that toe and the last 24 hours gotten worse. He has no systemic symptoms. No fever chills etc.  His morning blood sugar was less than 120  Since he got back to the beach his allergies are bothering him again.   Review of Systems Review of systems otherwise negative    Objective:   Physical Exam Well-developed well-nourished male vital signs stable he is afebrile BP 126/82  Examination of the right foot shows a palpable pulse one plus dorsalis pedis and posterior tibial. The right fourth toe is red and swollen. I can express no pus. On the of that toe the callus formation is warm soft. He probably were off to [place] play golf 4 days and around. Because of his diabetes he probably has some underlying neuropathy       Assessment & Plan:  Cellulitis right fourth toe...........Marland Kitchenwarm soaks 3 times daily elevation  Allergic rhinitis      OTC Claritin

## 2013-10-24 NOTE — Patient Instructions (Signed)
Keflex 500 mg........... 2 twice daily  Warm soaks 15 minutes 3 times daily  Keep that leg elevated  Followup in the office Monday at noon

## 2013-10-24 NOTE — Progress Notes (Signed)
Pre visit review using our clinic review tool, if applicable. No additional management support is needed unless otherwise documented below in the visit note. 

## 2013-10-26 ENCOUNTER — Ambulatory Visit (INDEPENDENT_AMBULATORY_CARE_PROVIDER_SITE_OTHER): Payer: Medicare Other | Admitting: Family Medicine

## 2013-10-26 ENCOUNTER — Encounter: Payer: Self-pay | Admitting: Family Medicine

## 2013-10-26 VITALS — BP 120/80 | Temp 98.0°F | Wt 229.0 lb

## 2013-10-26 DIAGNOSIS — L03031 Cellulitis of right toe: Secondary | ICD-10-CM

## 2013-10-26 DIAGNOSIS — F411 Generalized anxiety disorder: Secondary | ICD-10-CM | POA: Diagnosis not present

## 2013-10-26 DIAGNOSIS — L03039 Cellulitis of unspecified toe: Secondary | ICD-10-CM | POA: Diagnosis not present

## 2013-10-26 DIAGNOSIS — L02619 Cutaneous abscess of unspecified foot: Secondary | ICD-10-CM | POA: Diagnosis not present

## 2013-10-26 MED ORDER — LORAZEPAM 0.5 MG PO TABS: 0.5000 mg | ORAL_TABLET | Freq: Two times a day (BID) | ORAL | Status: DC | PRN

## 2013-10-26 NOTE — Patient Instructions (Signed)
Ativan 0.5 mg......... one twice daily  Continue the warm soaks twice daily  Continue the oral antibiotics  Wrap the toe as I showed you during the day and leave it open to the air at bedtime  Return when necessary

## 2013-10-26 NOTE — Progress Notes (Signed)
Pre visit review using our clinic review tool, if applicable. No additional management support is needed unless otherwise documented below in the visit note. 

## 2013-10-26 NOTE — Progress Notes (Signed)
   Subjective:    Patient ID: [name], male    DOB: 1941/02/05, 73 y.o.   MRN: [id number]  HPI [name] is a 73 year old married male nonsmoker retired [employer] who comes in today for evaluation of 2 problems  I saw him a Saturday clinic with an infection of his toe. He been to the beach play golf 4 days and were off the skin on the end of his toe and developed a secondary cellulitis. We've kept him at bedrest with elevation of that leg and Keflex 1 g twice a day and he's done well. Is not as red or swollen as it was on Saturday.  Also he says he has not had a drink of alcohol for 3 days. His wife has a lot of issues and he's been drinking a little too much alcohol he was concerned about therefore he stopped it completely. He would like something to help with his anxiety instead of alcohol   Review of Systems  negative    Objective:   Physical Exam Well-developed well-nourished in no acute distress vital signs stable he is afebrile examination of foot shows the toe is healing is not as red hot or swollen as it was 48 hours ago.       Assessment & Plan:  Cellulitis of his toe right foot improving with current treatment continue current treatment  Anxiety.........Marland Kitchen Ativan 0.5 twice a day

## 2013-10-29 ENCOUNTER — Other Ambulatory Visit: Payer: Self-pay | Admitting: *Deleted

## 2013-10-29 DIAGNOSIS — E119 Type 2 diabetes mellitus without complications: Secondary | ICD-10-CM

## 2013-10-29 MED ORDER — GLUCOSE BLOOD VI STRP: ORAL_STRIP | Status: DC

## 2013-11-03 ENCOUNTER — Telehealth: Payer: Self-pay | Admitting: Family Medicine

## 2013-11-03 NOTE — Telephone Encounter (Signed)
Pt is needing new rx Lancets (ACCU-CHEK MULTICLIX) lancets, pt states pharmacy informed him that the wrong rx is being sent to them . Send to [facility]

## 2013-11-04 NOTE — Telephone Encounter (Signed)
A new prescription has been faxed

## 2013-11-09 ENCOUNTER — Emergency Department (HOSPITAL_COMMUNITY)
Admission: EM | Admit: 2013-11-09 | Discharge: 2013-11-09 | Disposition: A | Discharge status: Home / Self Care | Payer: Medicare Other | Attending: Emergency Medicine | Admitting: Emergency Medicine

## 2013-11-09 ENCOUNTER — Emergency Department (HOSPITAL_COMMUNITY): Payer: Medicare Other

## 2013-11-09 ENCOUNTER — Encounter (HOSPITAL_COMMUNITY): Payer: Self-pay | Admitting: Emergency Medicine

## 2013-11-09 DIAGNOSIS — I2510 Atherosclerotic heart disease of native coronary artery without angina pectoris: Secondary | ICD-10-CM | POA: Diagnosis not present

## 2013-11-09 DIAGNOSIS — I10 Essential (primary) hypertension: Secondary | ICD-10-CM | POA: Insufficient documentation

## 2013-11-09 DIAGNOSIS — M25569 Pain in unspecified knee: Secondary | ICD-10-CM | POA: Diagnosis not present

## 2013-11-09 DIAGNOSIS — Z792 Long term (current) use of antibiotics: Secondary | ICD-10-CM | POA: Diagnosis not present

## 2013-11-09 DIAGNOSIS — Z87891 Personal history of nicotine dependence: Secondary | ICD-10-CM | POA: Diagnosis not present

## 2013-11-09 DIAGNOSIS — Z79899 Other long term (current) drug therapy: Secondary | ICD-10-CM | POA: Insufficient documentation

## 2013-11-09 DIAGNOSIS — Z87442 Personal history of urinary calculi: Secondary | ICD-10-CM | POA: Insufficient documentation

## 2013-11-09 DIAGNOSIS — E119 Type 2 diabetes mellitus without complications: Secondary | ICD-10-CM | POA: Diagnosis not present

## 2013-11-09 DIAGNOSIS — M7040 Prepatellar bursitis, unspecified knee: Secondary | ICD-10-CM | POA: Insufficient documentation

## 2013-11-09 DIAGNOSIS — Z87448 Personal history of other diseases of urinary system: Secondary | ICD-10-CM | POA: Diagnosis not present

## 2013-11-09 DIAGNOSIS — M1990 Unspecified osteoarthritis, unspecified site: Secondary | ICD-10-CM | POA: Diagnosis not present

## 2013-11-09 DIAGNOSIS — M7050 Other bursitis of knee, unspecified knee: Secondary | ICD-10-CM

## 2013-11-09 DIAGNOSIS — M25562 Pain in left knee: Secondary | ICD-10-CM

## 2013-11-09 MED ORDER — HYDROCODONE-ACETAMINOPHEN 5-325 MG PO TABS: 1.0000 | ORAL_TABLET | Freq: Four times a day (QID) | ORAL | Status: DC | PRN

## 2013-11-09 NOTE — ED Provider Notes (Signed)
Medical screening examination/treatment/procedure(s) were conducted as a shared visit with non-physician practitioner(s) and myself.  I personally evaluated the patient during the encounter.   EKG Interpretation None     One day h/o left knee suprapatellar swelling, pain.  No signs of septic joint, cellulitis.  Has f/u with ortho scheduled  Kalman Drape, MD 11/09/13 [phone]

## 2013-11-09 NOTE — Discharge Instructions (Signed)
Bursitis Bursitis is a swelling and soreness (inflammation) of a fluid-filled sac (bursa) that overlies and protects a joint. It can be caused by injury, overuse of the joint, arthritis or infection. The joints most likely to be affected are the elbows, shoulders, hips and knees. HOME CARE INSTRUCTIONS   Apply ice to the affected area for 15-20 minutes each hour while awake for 2 days. Put the ice in a plastic bag and place a towel between the bag of ice and your skin.  Rest the injured joint as much as possible, but continue to put the joint through a full range of motion, 4 times per day. (The shoulder joint especially becomes rapidly "frozen" if not used.) When the pain lessens, begin normal slow movements and usual activities.  Only take over-the-counter or prescription medicines for pain, discomfort or fever as directed by your caregiver.  Your caregiver may recommend draining the bursa and injecting medicine into the bursa. This may help the healing process.  Follow all instructions for follow-up with your caregiver. This includes any orthopedic referrals, physical therapy and rehabilitation. Any delay in obtaining necessary care could result in a delay or failure of the bursitis to heal and chronic pain. SEEK IMMEDIATE MEDICAL CARE IF:   Your pain increases even during treatment.  You develop an oral temperature above 102 F (38.9 C) and have heat and inflammation over the involved bursa. MAKE SURE YOU:   Understand these instructions.  Will watch your condition.  Will get help right away if you are not doing well or get worse. Document Released: 05/11/2000 Document Revised: 08/06/2011 Document Reviewed: 04/15/2009 [facility] Patient Information 2014 [place]. RICE: Routine Care for Injuries The routine care of many injuries includes Rest, Ice, Compression, and Elevation (RICE). HOME CARE INSTRUCTIONS Rest is needed to allow your body to heal. Routine activities can usually  be resumed when comfortable. Injured tendons and bones can take up to 6 weeks to heal. Tendons are the cord-like structures that attach muscle to bone. Ice following an injury helps keep the swelling down and reduces pain. Put ice in a plastic bag. Place a towel between your skin and the bag. Leave the ice on for 15-20 minutes, 03-04 times a day. Do this while awake, for the first 24 to 48 hours. After that, continue as directed by your caregiver. Compression helps keep swelling down. It also gives support and helps with discomfort. If an elastic bandage has been applied, it should be removed and reapplied every 3 to 4 hours. It should not be applied tightly, but firmly enough to keep swelling down. Watch fingers or toes for swelling, bluish discoloration, coldness, numbness, or excessive pain. If any of these problems occur, remove the bandage and reapply loosely. Contact your caregiver if these problems continue. Elevation helps reduce swelling and decreases pain. With extremities, such as the arms, hands, legs, and feet, the injured area should be placed near or above the level of the heart, if possible. SEEK IMMEDIATE MEDICAL CARE IF: You have persistent pain and swelling. You develop redness, numbness, or unexpected weakness. Your symptoms are getting worse rather than improving after several days. These symptoms may indicate that further evaluation or further X-rays are needed. Sometimes, X-rays may not show a small broken bone (fracture) until 1 week or 10 days later. Make a follow-up appointment with your caregiver. Ask when your X-ray results will be ready. Make sure you get your X-ray results. Document Released: 08/26/2000 Document Revised: 08/06/2011 Document Reviewed: 10/13/2010  [employer] Patient Information 2014 [place].

## 2013-11-09 NOTE — ED Provider Notes (Signed)
CSN: [id number]     Arrival date & time 11/09/13  0119 History   First MD Initiated Contact with Patient 11/09/13 0206     Chief Complaint  Patient presents with  . Knee Pain    (Consider location/radiation/quality/duration/timing/severity/associated sxs/prior Treatment) Patient is a 73 y.o. male presenting with knee pain. The history is provided by the patient. No language interpreter was used.  Knee Pain Location:  Knee Time since incident:  1 day Knee location:  L knee Pain details:    Quality:  Aching and sharp   Radiates to:  Does not radiate   Severity:  Moderate   Onset quality:  Sudden   Duration:  1 day   Timing:  Constant (constant ache with intermittent sharp pains)   Progression:  Waxing and waning Chronicity:  New Prior injury to area:  No Relieved by:  Elevation and rest (tramadol) Worsened by:  Bearing weight and flexion Associated symptoms: swelling (mild)   Associated symptoms: no back pain, no fever, no muscle weakness, no numbness and no tingling     Past Medical History  Diagnosis Date  . ED (erectile dysfunction)   . Rhinitis   . Hypertension   . Osteoarthritis   . CAD (coronary artery disease)     Minimal plaque cath 2008  . Rheumatic fever   . Diabetes mellitus   . Recurrent kidney stones    Past Surgical History  Procedure Laterality Date  . Inguinal hernia repair    . Colonoscopy  2006, 2010   Family History  Problem Relation Age of Onset  . Stroke Father 40    Died age 25  . Coronary artery disease Brother 41   History  Substance Use Topics  . Smoking status: Former Smoker    Quit date: 07/26/1981  . Smokeless tobacco: Not on file  . Alcohol Use: Yes    Review of Systems  Constitutional: Negative for fever.  Musculoskeletal: Positive for arthralgias and myalgias. Negative for back pain.  Skin: Negative for pallor.  Neurological: Negative for weakness and numbness.  All other systems reviewed and are negative.    Allergies   Review of patient's allergies indicates no known allergies.  Home Medications   Prior to Admission medications   Medication Sig Start Date End Date Taking? Authorizing Provider  aspirin 81 MG tablet Take 81 mg by mouth as needed for pain.    Yes Historical Provider, MD  atenolol-chlorthalidone (TENORETIC) 50-25 MG per tablet TAKE ONE TABLET BY MOUTH ONE TIME DAILY  10/06/13  Yes Dorena Cookey, MD  cephALEXin (KEFLEX) 500 MG capsule Take 1,000 mg by mouth 2 (two) times daily. Patient to complete a 21 day therapy course on 11/09/12   Yes Historical Provider, MD  glipiZIDE (GLUCOTROL) 5 MG tablet One tablet prior to breakfast 01/13/13 01/13/14 Yes Marletta Lor, MD  loratadine (CLARITIN) 10 MG tablet Take 10 mg by mouth daily.   Yes Historical Provider, MD  metFORMIN (GLUCOPHAGE) 1000 MG tablet TAKE ONE TABLET BY MOUTH TWICE DAILY    Yes Dorena Cookey, MD  potassium chloride SA (K-DUR,KLOR-CON) 20 MEQ tablet TAKE ONE TABLET BY MOUTH THREE TIMES DAILY  05/25/13  Yes Dorena Cookey, MD  potassium chloride SA (K-DUR,KLOR-CON) 20 MEQ tablet Take 20-40 mEq by mouth 2 (two) times daily. [place] AND 20 MEQ AT NIGHT   Yes Historical Provider, MD  traMADol (ULTRAM) 50 MG tablet Take 25-50 mg by mouth 2 (two) times daily  as needed (for pain).   Yes Historical Provider, MD  glucose blood (ACCU-CHEK AVIVA) test strip Use once daily, 250.02 Dx 10/29/13   Dorena Cookey, MD  HYDROcodone-acetaminophen (NORCO/VICODIN) 5-325 MG per tablet Take 1 tablet by mouth every 6 (six) hours as needed for moderate pain or severe pain. 11/09/13   Antonietta Breach, PA-C  Lancets (ACCU-CHEK MULTICLIX) lancets 1 each by Other route daily. Dx 25.00 08/17/13   Dorena Cookey, MD   BP 131/77  Pulse 70  Temp(Src) 98.6 F (37 C) (Oral)  Resp 18  SpO2 96%  Physical Exam  Nursing note and vitals reviewed. Constitutional: He is oriented to person, place, and time. He appears well-developed and well-nourished. No  distress.  HENT:  Head: Normocephalic and atraumatic.  Eyes: Conjunctivae and EOM are normal. No scleral icterus.  Neck: Normal range of motion.  Cardiovascular: Normal rate, regular rhythm and intact distal pulses.   DP and PT pulses 2+ b/l  Pulmonary/Chest: Effort normal. No respiratory distress.  Musculoskeletal:       Left knee: He exhibits decreased range of motion (decreased active ROM secondary to pain; full passive ROM) and swelling (mild suprapatellar). He exhibits no effusion, no deformity, normal alignment, no LCL laxity and no MCL laxity. Tenderness found.       Left upper leg: Normal.       Left lower leg: Normal.       Legs: Neurological: He is alert and oriented to person, place, and time. He exhibits normal muscle tone. Coordination normal.  No gross sensory deficits appreciated.  Skin: Skin is warm and dry. No rash noted. He is not diaphoretic. No erythema. No pallor.  Psychiatric: He has a normal mood and affect. His behavior is normal.    ED Course  Procedures (including critical care time) Labs Review Labs Reviewed - No data to display  Imaging Review Dg Knee Complete 4 Views Left  11/09/2013   CLINICAL DATA:  Left anterior knee pain for 2 days.  EXAM: LEFT KNEE - COMPLETE 4+ VIEW  COMPARISON:  None.  FINDINGS: There is no evidence of fracture or dislocation. The joint spaces are preserved. No significant degenerative change is seen; the patellofemoral joint is grossly unremarkable in appearance. A small enthesophyte is noted at the upper pole of the patella.  No significant joint effusion is seen. Scattered vascular calcifications are seen.  IMPRESSION: No evidence of fracture or dislocation.   Electronically Signed   By: Garald Balding M.D.   On: 11/09/2013 02:16     EKG Interpretation None      MDM   Final diagnoses:  Suprapatellar bursitis  Knee pain, left    73 year old male presents to the emergency department for left knee pain with onset 24 hours  ago. Pain is a dull ache with intermittent sharp sensations with weightbearing and movement. Patient neurovascularly intact. No sensory deficits appreciated. No evidence of septic joint. There is mild swelling superior to the patella suggestive of mild suprapatellar bursitis. X-ray shows no evidence of fracture or dislocation. While applying a knee sleeve in ED. Patient stable for discharge with instruction to followup with his orthopedist in 2 days. RICE advised. Patient agreeable to plan with no unaddressed concerns.   Filed Vitals:   11/09/13 0135 11/09/13 0328  BP: 131/77 144/54  Pulse: 70 40  Temp: 98.6 F (37 C)   TempSrc: Oral   Resp: 18 18  SpO2: 96% 97%       Antonietta Breach, PA-C  11/09/13 0337 

## 2013-11-09 NOTE — ED Notes (Signed)
Pt arrived to the ED with a complaint of left knee pain.  Pt has experienced the pain for 24 hours.  Pt describes the pain as a dull ache.  The pain becomes sharp with movement.  Pt states it feels like something is underneath of it.

## 2013-11-10 ENCOUNTER — Encounter (HOSPITAL_COMMUNITY): Payer: Self-pay | Admitting: Emergency Medicine

## 2013-11-10 ENCOUNTER — Emergency Department (HOSPITAL_COMMUNITY)
Admission: EM | Admit: 2013-11-10 | Discharge: 2013-11-10 | Disposition: A | Discharge status: Home / Self Care | Payer: Medicare Other | Attending: Emergency Medicine | Admitting: Emergency Medicine

## 2013-11-10 DIAGNOSIS — Z87442 Personal history of urinary calculi: Secondary | ICD-10-CM | POA: Diagnosis not present

## 2013-11-10 DIAGNOSIS — M1990 Unspecified osteoarthritis, unspecified site: Secondary | ICD-10-CM | POA: Insufficient documentation

## 2013-11-10 DIAGNOSIS — Z79899 Other long term (current) drug therapy: Secondary | ICD-10-CM | POA: Diagnosis not present

## 2013-11-10 DIAGNOSIS — Z87448 Personal history of other diseases of urinary system: Secondary | ICD-10-CM | POA: Insufficient documentation

## 2013-11-10 DIAGNOSIS — Z792 Long term (current) use of antibiotics: Secondary | ICD-10-CM | POA: Insufficient documentation

## 2013-11-10 DIAGNOSIS — I10 Essential (primary) hypertension: Secondary | ICD-10-CM | POA: Diagnosis not present

## 2013-11-10 DIAGNOSIS — M7040 Prepatellar bursitis, unspecified knee: Secondary | ICD-10-CM | POA: Diagnosis not present

## 2013-11-10 DIAGNOSIS — IMO0002 Reserved for concepts with insufficient information to code with codable children: Secondary | ICD-10-CM | POA: Insufficient documentation

## 2013-11-10 DIAGNOSIS — Z8709 Personal history of other diseases of the respiratory system: Secondary | ICD-10-CM | POA: Diagnosis not present

## 2013-11-10 DIAGNOSIS — Z87891 Personal history of nicotine dependence: Secondary | ICD-10-CM | POA: Insufficient documentation

## 2013-11-10 DIAGNOSIS — M7052 Other bursitis of knee, left knee: Secondary | ICD-10-CM

## 2013-11-10 DIAGNOSIS — E119 Type 2 diabetes mellitus without complications: Secondary | ICD-10-CM | POA: Insufficient documentation

## 2013-11-10 LAB — CBC WITH DIFFERENTIAL/PLATELET
Basophils Absolute: 0.1 10*3/uL (ref 0.0–0.1)
Basophils Relative: 2 % — ABNORMAL HIGH (ref 0–1)
Eosinophils Absolute: 0.5 10*3/uL (ref 0.0–0.7)
Eosinophils Relative: 7 % — ABNORMAL HIGH (ref 0–5)
HCT: 38.6 % — ABNORMAL LOW (ref 39.0–52.0)
Hemoglobin: 13 g/dL (ref 13.0–17.0)
Lymphocytes Relative: 18 % (ref 12–46)
Lymphs Abs: 1.3 10*3/uL (ref 0.7–4.0)
MCH: 31.5 pg (ref 26.0–34.0)
MCHC: 33.7 g/dL (ref 30.0–36.0)
MCV: 93.5 fL (ref 78.0–100.0)
Monocytes Absolute: 0.7 10*3/uL (ref 0.1–1.0)
Monocytes Relative: 10 % (ref 3–12)
Neutro Abs: 4.4 10*3/uL (ref 1.7–7.7)
Neutrophils Relative %: 63 % (ref 43–77)
Platelets: 186 10*3/uL (ref 150–400)
RBC: 4.13 MIL/uL — ABNORMAL LOW (ref 4.22–5.81)
RDW: 12.2 % (ref 11.5–15.5)
WBC: 7 10*3/uL (ref 4.0–10.5)

## 2013-11-10 LAB — BASIC METABOLIC PANEL
BUN: 18 mg/dL (ref 6–23)
CO2: 24 mEq/L (ref 19–32)
Calcium: 9.4 mg/dL (ref 8.4–10.5)
Chloride: 100 mEq/L (ref 96–112)
Creatinine, Ser: 0.69 mg/dL (ref 0.50–1.35)
GFR calc Af Amer: >90 mL/min (ref >90)
GFR calc non Af Amer: >90 mL/min (ref >90)
Glucose, Bld: 130 mg/dL — ABNORMAL HIGH (ref 70–99)
Potassium: 4.3 mEq/L (ref 3.7–5.3)
Sodium: 137 mEq/L (ref 137–147)

## 2013-11-10 LAB — C-REACTIVE PROTEIN: CRP: 0.6 mg/dL — ABNORMAL HIGH (ref <0.60)

## 2013-11-10 LAB — SEDIMENTATION RATE: Sed Rate: 31 mm/hr — ABNORMAL HIGH (ref 0–16)

## 2013-11-10 MED ORDER — OXYCODONE-ACETAMINOPHEN 5-325 MG PO TABS: 1.0000 | ORAL_TABLET | ORAL | Status: DC | PRN

## 2013-11-10 MED ORDER — OXYCODONE-ACETAMINOPHEN 5-325 MG PO TABS
1.0000 | ORAL_TABLET | Freq: Once | ORAL | Status: AC
  Administered 2013-11-10: 1 via ORAL
  Filled 2013-11-10: qty 1

## 2013-11-10 MED ORDER — DOXYCYCLINE HYCLATE 100 MG PO CAPS: 100.0000 mg | ORAL_CAPSULE | Freq: Two times a day (BID) | ORAL | Status: DC

## 2013-11-10 MED ORDER — LIDOCAINE HCL 2 % IJ SOLN
5.0000 mL | Freq: Once | INTRAMUSCULAR | Status: AC
  Administered 2013-11-10: 100 mg
  Filled 2013-11-10: qty 20

## 2013-11-10 NOTE — ED Provider Notes (Signed)
CSN: [id number]     Arrival date & time 11/10/13  6948 History   First MD Initiated Contact with Patient 11/10/13 0845     Chief Complaint  Patient presents with  . Knee Pain     (Consider location/radiation/quality/duration/timing/severity/associated sxs/prior Treatment) HPI Comments: Patient presents to the ER for evaluation of left knee pain. Patient reports that symptoms began 2 days ago. He was seen in the emergency department yesterday and told that bursitis. He was given tramadol, reports that this is not helping. Patient reports constant, severe pain that worsens when he tries to walk. He denies injury. There has not been any fever.  Patient is a 73 y.o. male presenting with knee pain.  Knee Pain Associated symptoms: no fever     Past Medical History  Diagnosis Date  . ED (erectile dysfunction)   . Rhinitis   . Hypertension   . Osteoarthritis   . CAD (coronary artery disease)     Minimal plaque cath 2008  . Rheumatic fever   . Diabetes mellitus   . Recurrent kidney stones    Past Surgical History  Procedure Laterality Date  . Inguinal hernia repair    . Colonoscopy  2006, 2010   Family History  Problem Relation Age of Onset  . Stroke Father 13    Died age 74  . Coronary artery disease Brother 77   History  Substance Use Topics  . Smoking status: Former Smoker    Quit date: 07/26/1981  . Smokeless tobacco: Not on file  . Alcohol Use: Yes    Review of Systems  Constitutional: Negative for fever.  Musculoskeletal: Positive for arthralgias.  All other systems reviewed and are negative.     Allergies  Review of patient's allergies indicates no known allergies.  Home Medications   Prior to Admission medications   Medication Sig Start Date End Date Taking? Authorizing Rudell Marlowe  aspirin 81 MG tablet Take 81 mg by mouth as needed for pain.    Yes Historical Jarret Torre, MD  atenolol-chlorthalidone (TENORETIC) 50-25 MG per tablet Take 1 tablet by mouth  daily.   Yes Historical Ziana Heyliger, MD  fluticasone (FLONASE) 50 MCG/ACT nasal spray Place 1 spray into both nostrils daily.   Yes Historical Kameryn Davern, MD  glipiZIDE (GLUCOTROL) 5 MG tablet Take 5 mg by mouth daily before breakfast.   Yes Historical Mutasim Tuckey, MD  loratadine (CLARITIN) 10 MG tablet Take 10 mg by mouth daily.   Yes Historical Brayon Bielefeld, MD  metFORMIN (GLUCOPHAGE) 1000 MG tablet Take 1,000 mg by mouth 2 (two) times daily with a meal.   Yes Historical Tania Steinhauser, MD  potassium chloride SA (K-DUR,KLOR-CON) 20 MEQ tablet Take 20-40 mEq by mouth 2 (two) times daily. [place] AND 20 MEQ AT NIGHT   Yes Historical Chiyo Fay, MD  traMADol (ULTRAM) 50 MG tablet Take 25-50 mg by mouth 2 (two) times daily as needed (for pain).   Yes Historical Laticia Vannostrand, MD  doxycycline (VIBRAMYCIN) 100 MG capsule Take 1 capsule (100 mg total) by mouth 2 (two) times daily. 11/10/13   Orpah Greek, MD  oxyCODONE-acetaminophen (PERCOCET) 5-325 MG per tablet Take 1 tablet by mouth every 4 (four) hours as needed. 11/10/13   Orpah Greek, MD   BP 113/65  Pulse 50  Temp(Src) 98.4 F (36.9 C) (Oral)  Resp 18  SpO2 100% Physical Exam  Constitutional: He is oriented to person, place, and time. He appears well-developed and well-nourished. No distress.  HENT:  Head: Normocephalic  and atraumatic.  Right Ear: Hearing normal.  Left Ear: Hearing normal.  Nose: Nose normal.  Mouth/Throat: Oropharynx is clear and moist and mucous membranes are normal.  Eyes: Conjunctivae and EOM are normal. Pupils are equal, round, and reactive to light.  Neck: Normal range of motion. Neck supple.  Cardiovascular: Regular rhythm, S1 normal and S2 normal.  Exam reveals no gallop and no friction rub.   No murmur heard. Pulmonary/Chest: Effort normal and breath sounds normal. No respiratory distress. He exhibits no tenderness.  Abdominal: Soft. Normal appearance and bowel sounds are normal. There is no  hepatosplenomegaly. There is no tenderness. There is no rebound, no guarding, no tenderness at McBurney's point and negative Murphy's sign. No hernia.  Musculoskeletal: Normal range of motion.       Left knee: He exhibits erythema. He exhibits normal range of motion. Tenderness found.       Legs: Neurological: He is alert and oriented to person, place, and time. He has normal strength. No cranial nerve deficit or sensory deficit. Coordination normal. GCS eye subscore is 4. GCS verbal subscore is 5. GCS motor subscore is 6.  Skin: Skin is warm, dry and intact. No rash noted. No cyanosis.  Psychiatric: He has a normal mood and affect. His speech is normal and behavior is normal. Thought content normal.    ED Course  Procedures (including critical care time)  Left suprapatellar aspiration: Area was sterilely prepped and draped. Betadine solution was utilized to prep the area. Sterile technique was followed. The area was infiltrated with 2% lidocaine in sterile manner. A 21-gauge needle was then advanced into the area of tenderness and firmness in the suprapatellar region under constant aspiration. No fluid was encountered, the procedure was terminated. Patient tolerated procedure well. Complications.  Labs Review Labs Reviewed  CBC WITH DIFFERENTIAL - Abnormal; Notable for the following:    RBC 4.13 (*)    HCT 38.6 (*)    Eosinophils Relative 7 (*)    Basophils Relative 2 (*)    All other components within normal limits  BASIC METABOLIC PANEL - Abnormal; Notable for the following:    Glucose, Bld 130 (*)    All other components within normal limits  SEDIMENTATION RATE - Abnormal; Notable for the following:    Sed Rate 31 (*)    All other components within normal limits  C-REACTIVE PROTEIN    Imaging Review Dg Knee Complete 4 Views Left  11/09/2013   CLINICAL DATA:  Left anterior knee pain for 2 days.  EXAM: LEFT KNEE - COMPLETE 4+ VIEW  COMPARISON:  None.  FINDINGS: There is no  evidence of fracture or dislocation. The joint spaces are preserved. No significant degenerative change is seen; the patellofemoral joint is grossly unremarkable in appearance. A small enthesophyte is noted at the upper pole of the patella.  No significant joint effusion is seen. Scattered vascular calcifications are seen.  IMPRESSION: No evidence of fracture or dislocation.   Electronically Signed   By: Garald Balding M.D.   On: 11/09/2013 02:16     EKG Interpretation None      MDM   Final diagnoses:  Suprapatellar bursitis of left knee    Patient presents to the ER for evaluation of worsening knee pain. Patient was seen yesterday for knee pain and diagnosed with a suprapatellar bursitis. I do feel that the diagnosis is correct. There is some erythema in the region currently. This raises concern for possible infectious etiology, including septic bursitis as  well as cellulitis. I recommended aspiration of the region to collect fluid for study. He did consent to this after discussing risks and benefits. I did attempt an aspiration, but no fluid was obtained. Patient will be initiated on antibiotics and increased pain control. He has followup with Doctor Meridee Score tomorrow at 3:15 PM.    Orpah Greek, MD 11/10/13 1113

## 2013-11-10 NOTE — ED Notes (Signed)
Pt complaint of LEFT KNEE pain unrelieved by medication as discharged yesterday.

## 2013-11-10 NOTE — Discharge Instructions (Signed)
Make sure that you see Doctor Sharol Given tomorrow as scheduled. If you've start running a fever or have increasing redness and swelling of the knee, return to the ER  Bursitis Bursitis is a swelling and soreness (inflammation) of a fluid-filled sac (bursa) that overlies and protects a joint. It can be caused by injury, overuse of the joint, arthritis or infection. The joints most likely to be affected are the elbows, shoulders, hips and knees. HOME CARE INSTRUCTIONS   Apply ice to the affected area for 15-20 minutes each hour while awake for 2 days. Put the ice in a plastic bag and place a towel between the bag of ice and your skin.  Rest the injured joint as much as possible, but continue to put the joint through a full range of motion, 4 times per day. (The shoulder joint especially becomes rapidly "frozen" if not used.) When the pain lessens, begin normal slow movements and usual activities.  Only take over-the-counter or prescription medicines for pain, discomfort or fever as directed by your caregiver.  Your caregiver may recommend draining the bursa and injecting medicine into the bursa. This may help the healing process.  Follow all instructions for follow-up with your caregiver. This includes any orthopedic referrals, physical therapy and rehabilitation. Any delay in obtaining necessary care could result in a delay or failure of the bursitis to heal and chronic pain. SEEK IMMEDIATE MEDICAL CARE IF:   Your pain increases even during treatment.  You develop an oral temperature above 102 F (38.9 C) and have heat and inflammation over the involved bursa. MAKE SURE YOU:   Understand these instructions.  Will watch your condition.  Will get help right away if you are not doing well or get worse. Document Released: 05/11/2000 Document Revised: 08/06/2011 Document Reviewed: 04/15/2009 [facility] Patient Information 2014 [place].

## 2013-11-10 NOTE — ED Notes (Signed)
Pt c/o right knee pain since Sunday morning. Pt states that he was seen here yesterday and was told it was bursitis and the meds that he was given havent helped with the pain. Pt states that he can put some weight on it once he gets up.

## 2013-11-11 DIAGNOSIS — E1149 Type 2 diabetes mellitus with other diabetic neurological complication: Secondary | ICD-10-CM | POA: Diagnosis not present

## 2013-11-11 DIAGNOSIS — M1A9XX1 Chronic gout, unspecified, with tophus (tophi): Secondary | ICD-10-CM | POA: Diagnosis not present

## 2013-11-11 DIAGNOSIS — L97509 Non-pressure chronic ulcer of other part of unspecified foot with unspecified severity: Secondary | ICD-10-CM | POA: Diagnosis not present

## 2013-11-11 DIAGNOSIS — M25569 Pain in unspecified knee: Secondary | ICD-10-CM | POA: Diagnosis not present

## 2013-12-04 DIAGNOSIS — L97509 Non-pressure chronic ulcer of other part of unspecified foot with unspecified severity: Secondary | ICD-10-CM | POA: Diagnosis not present

## 2013-12-04 DIAGNOSIS — M109 Gout, unspecified: Secondary | ICD-10-CM | POA: Diagnosis not present

## 2013-12-04 DIAGNOSIS — M86179 Other acute osteomyelitis, unspecified ankle and foot: Secondary | ICD-10-CM | POA: Diagnosis not present

## 2013-12-04 DIAGNOSIS — M1A9XX1 Chronic gout, unspecified, with tophus (tophi): Secondary | ICD-10-CM | POA: Diagnosis not present

## 2013-12-04 DIAGNOSIS — E1149 Type 2 diabetes mellitus with other diabetic neurological complication: Secondary | ICD-10-CM | POA: Diagnosis not present

## 2013-12-04 DIAGNOSIS — M25569 Pain in unspecified knee: Secondary | ICD-10-CM | POA: Diagnosis not present

## 2013-12-08 DIAGNOSIS — Z85828 Personal history of other malignant neoplasm of skin: Secondary | ICD-10-CM | POA: Diagnosis not present

## 2013-12-08 DIAGNOSIS — L570 Actinic keratosis: Secondary | ICD-10-CM | POA: Diagnosis not present

## 2013-12-08 DIAGNOSIS — L821 Other seborrheic keratosis: Secondary | ICD-10-CM | POA: Diagnosis not present

## 2013-12-16 DIAGNOSIS — M86179 Other acute osteomyelitis, unspecified ankle and foot: Secondary | ICD-10-CM | POA: Diagnosis not present

## 2013-12-17 ENCOUNTER — Ambulatory Visit: Payer: Medicare Other | Admitting: Physician Assistant

## 2013-12-17 ENCOUNTER — Other Ambulatory Visit: Payer: Self-pay

## 2013-12-17 ENCOUNTER — Other Ambulatory Visit (INDEPENDENT_AMBULATORY_CARE_PROVIDER_SITE_OTHER): Payer: Medicare Other

## 2013-12-17 DIAGNOSIS — E119 Type 2 diabetes mellitus without complications: Secondary | ICD-10-CM

## 2013-12-17 LAB — LIPID PANEL
Cholesterol: 114 mg/dL (ref 0–200)
HDL: 40.7 mg/dL (ref >39.00)
LDL Cholesterol: 58 mg/dL (ref 0–99)
NonHDL: 73.3
Total CHOL/HDL Ratio: 3
Triglycerides: 78 mg/dL (ref 0.0–149.0)
VLDL: 15.6 mg/dL (ref 0.0–40.0)

## 2013-12-17 LAB — BASIC METABOLIC PANEL
BUN: 14 mg/dL (ref 6–23)
CO2: 28 mEq/L (ref 19–32)
Calcium: 9.3 mg/dL (ref 8.4–10.5)
Chloride: 106 mEq/L (ref 96–112)
Creatinine, Ser: 0.7 mg/dL (ref 0.4–1.5)
GFR: 121.1 mL/min (ref >60.00)
Glucose, Bld: 84 mg/dL (ref 70–99)
Potassium: 4.2 mEq/L (ref 3.5–5.1)
Sodium: 140 mEq/L (ref 135–145)

## 2013-12-17 LAB — MICROALBUMIN / CREATININE URINE RATIO
Creatinine,U: 188.4 mg/dL
Microalb Creat Ratio: 0.6 mg/g (ref 0.0–30.0)
Microalb, Ur: 1.1 mg/dL (ref 0.0–1.9)

## 2013-12-17 LAB — HEMOGLOBIN A1C: Hgb A1c MFr Bld: 5.5 % (ref 4.6–6.5)

## 2013-12-22 DIAGNOSIS — E1149 Type 2 diabetes mellitus with other diabetic neurological complication: Secondary | ICD-10-CM | POA: Diagnosis not present

## 2013-12-22 DIAGNOSIS — L97509 Non-pressure chronic ulcer of other part of unspecified foot with unspecified severity: Secondary | ICD-10-CM | POA: Diagnosis not present

## 2013-12-22 DIAGNOSIS — M86179 Other acute osteomyelitis, unspecified ankle and foot: Secondary | ICD-10-CM | POA: Diagnosis not present

## 2013-12-29 DIAGNOSIS — M869 Osteomyelitis, unspecified: Secondary | ICD-10-CM | POA: Diagnosis not present

## 2013-12-29 DIAGNOSIS — L97509 Non-pressure chronic ulcer of other part of unspecified foot with unspecified severity: Secondary | ICD-10-CM | POA: Diagnosis not present

## 2013-12-29 DIAGNOSIS — G8918 Other acute postprocedural pain: Secondary | ICD-10-CM | POA: Diagnosis not present

## 2013-12-29 DIAGNOSIS — M86179 Other acute osteomyelitis, unspecified ankle and foot: Secondary | ICD-10-CM | POA: Diagnosis not present

## 2013-12-29 DIAGNOSIS — E1149 Type 2 diabetes mellitus with other diabetic neurological complication: Secondary | ICD-10-CM | POA: Diagnosis not present

## 2014-01-04 ENCOUNTER — Other Ambulatory Visit: Payer: Self-pay | Admitting: Family Medicine

## 2014-02-04 ENCOUNTER — Other Ambulatory Visit: Payer: Self-pay | Admitting: Internal Medicine

## 2014-03-26 ENCOUNTER — Telehealth: Payer: Self-pay | Admitting: Family Medicine

## 2014-03-26 NOTE — Telephone Encounter (Signed)
No. We are planning on having new provider's available next year and I am currently still trying to see Dr. Leanne Chang and Arnoldo Morale patient's who have not established yet.

## 2014-03-26 NOTE — Telephone Encounter (Signed)
Pt wife stated [name] want to switch to Dr. Yong Channel. Can I sch him with Dr. Yong Channel?  i

## 2014-03-29 NOTE — Telephone Encounter (Signed)
Left message on machine for patient that Dr Yong Channel is not accepting new patients at this time

## 2014-04-02 NOTE — Telephone Encounter (Signed)
Error/mm °

## 2014-04-11 ENCOUNTER — Other Ambulatory Visit: Payer: Self-pay | Admitting: Family Medicine

## 2014-05-05 ENCOUNTER — Other Ambulatory Visit: Payer: Self-pay | Admitting: Internal Medicine

## 2014-05-26 ENCOUNTER — Other Ambulatory Visit: Payer: Self-pay | Admitting: *Deleted

## 2014-05-26 MED ORDER — GLUCOSE BLOOD VI STRP: ORAL_STRIP | Status: DC

## 2014-05-26 NOTE — Telephone Encounter (Signed)
Rx done. 

## 2014-06-22 ENCOUNTER — Other Ambulatory Visit: Payer: Self-pay | Admitting: *Deleted

## 2014-06-22 DIAGNOSIS — D1801 Hemangioma of skin and subcutaneous tissue: Secondary | ICD-10-CM | POA: Diagnosis not present

## 2014-06-22 DIAGNOSIS — L218 Other seborrheic dermatitis: Secondary | ICD-10-CM | POA: Diagnosis not present

## 2014-06-22 DIAGNOSIS — L821 Other seborrheic keratosis: Secondary | ICD-10-CM | POA: Diagnosis not present

## 2014-06-22 DIAGNOSIS — Z85828 Personal history of other malignant neoplasm of skin: Secondary | ICD-10-CM | POA: Diagnosis not present

## 2014-06-22 DIAGNOSIS — L570 Actinic keratosis: Secondary | ICD-10-CM | POA: Diagnosis not present

## 2014-06-22 DIAGNOSIS — L720 Epidermal cyst: Secondary | ICD-10-CM | POA: Diagnosis not present

## 2014-06-22 MED ORDER — GLUCOSE BLOOD VI STRP: ORAL_STRIP | Status: DC

## 2014-06-25 ENCOUNTER — Telehealth: Payer: Self-pay | Admitting: *Deleted

## 2014-06-25 MED ORDER — ACCU-CHEK MULTICLIX LANCETS MISC: Status: DC

## 2014-06-25 NOTE — Telephone Encounter (Signed)
[facility] faxed a refill request for Accu-chek multiclix lancets-test once a daily.

## 2014-06-27 ENCOUNTER — Other Ambulatory Visit: Payer: Self-pay | Admitting: Family Medicine

## 2014-06-30 ENCOUNTER — Encounter: Payer: Self-pay | Admitting: Family Medicine

## 2014-06-30 DIAGNOSIS — E119 Type 2 diabetes mellitus without complications: Secondary | ICD-10-CM | POA: Diagnosis not present

## 2014-06-30 DIAGNOSIS — H2513 Age-related nuclear cataract, bilateral: Secondary | ICD-10-CM | POA: Diagnosis not present

## 2014-06-30 LAB — HM DIABETES EYE EXAM

## 2014-07-01 ENCOUNTER — Other Ambulatory Visit: Payer: Self-pay | Admitting: Family Medicine

## 2014-07-12 ENCOUNTER — Other Ambulatory Visit: Payer: Self-pay | Admitting: Family Medicine

## 2014-07-26 ENCOUNTER — Other Ambulatory Visit: Payer: Self-pay | Admitting: Family Medicine

## 2014-08-04 ENCOUNTER — Emergency Department (HOSPITAL_COMMUNITY): Payer: Medicare Other

## 2014-08-04 ENCOUNTER — Emergency Department (HOSPITAL_COMMUNITY)
Admission: EM | Admit: 2014-08-04 | Discharge: 2014-08-05 | Disposition: A | Discharge status: Home / Self Care | Payer: Medicare Other | Source: Home / Self Care | Attending: Emergency Medicine | Admitting: Emergency Medicine

## 2014-08-04 ENCOUNTER — Encounter (HOSPITAL_COMMUNITY): Payer: Self-pay

## 2014-08-04 DIAGNOSIS — I10 Essential (primary) hypertension: Secondary | ICD-10-CM | POA: Insufficient documentation

## 2014-08-04 DIAGNOSIS — Z79899 Other long term (current) drug therapy: Secondary | ICD-10-CM | POA: Diagnosis not present

## 2014-08-04 DIAGNOSIS — E1165 Type 2 diabetes mellitus with hyperglycemia: Secondary | ICD-10-CM | POA: Diagnosis not present

## 2014-08-04 DIAGNOSIS — J310 Chronic rhinitis: Secondary | ICD-10-CM | POA: Diagnosis not present

## 2014-08-04 DIAGNOSIS — R197 Diarrhea, unspecified: Principal | ICD-10-CM | POA: Insufficient documentation

## 2014-08-04 DIAGNOSIS — I517 Cardiomegaly: Secondary | ICD-10-CM | POA: Diagnosis not present

## 2014-08-04 DIAGNOSIS — E119 Type 2 diabetes mellitus without complications: Secondary | ICD-10-CM | POA: Diagnosis not present

## 2014-08-04 DIAGNOSIS — Z7982 Long term (current) use of aspirin: Secondary | ICD-10-CM | POA: Insufficient documentation

## 2014-08-04 DIAGNOSIS — M793 Panniculitis, unspecified: Secondary | ICD-10-CM

## 2014-08-04 DIAGNOSIS — I2510 Atherosclerotic heart disease of native coronary artery without angina pectoris: Secondary | ICD-10-CM | POA: Diagnosis not present

## 2014-08-04 DIAGNOSIS — Z87891 Personal history of nicotine dependence: Secondary | ICD-10-CM

## 2014-08-04 DIAGNOSIS — R52 Pain, unspecified: Secondary | ICD-10-CM

## 2014-08-04 DIAGNOSIS — R1110 Vomiting, unspecified: Secondary | ICD-10-CM | POA: Diagnosis not present

## 2014-08-04 DIAGNOSIS — R110 Nausea: Secondary | ICD-10-CM | POA: Diagnosis not present

## 2014-08-04 DIAGNOSIS — R109 Unspecified abdominal pain: Secondary | ICD-10-CM | POA: Insufficient documentation

## 2014-08-04 DIAGNOSIS — A09 Infectious gastroenteritis and colitis, unspecified: Secondary | ICD-10-CM | POA: Diagnosis not present

## 2014-08-04 DIAGNOSIS — R1013 Epigastric pain: Secondary | ICD-10-CM | POA: Diagnosis not present

## 2014-08-04 DIAGNOSIS — R112 Nausea with vomiting, unspecified: Secondary | ICD-10-CM | POA: Diagnosis not present

## 2014-08-04 DIAGNOSIS — J9811 Atelectasis: Secondary | ICD-10-CM | POA: Diagnosis not present

## 2014-08-04 LAB — COMPREHENSIVE METABOLIC PANEL
ALT: 15 U/L (ref 0–53)
AST: 19 U/L (ref 0–37)
Albumin: 4.3 g/dL (ref 3.5–5.2)
Alkaline Phosphatase: 59 U/L (ref 39–117)
Anion gap: 11 (ref 5–15)
BUN: 15 mg/dL (ref 6–23)
CO2: 31 mmol/L (ref 19–32)
Calcium: 9.1 mg/dL (ref 8.4–10.5)
Chloride: 96 mmol/L (ref 96–112)
Creatinine, Ser: 0.68 mg/dL (ref 0.50–1.35)
GFR calc Af Amer: >90 mL/min (ref >90)
GFR calc non Af Amer: >90 mL/min (ref >90)
Glucose, Bld: 132 mg/dL — ABNORMAL HIGH (ref 70–99)
Potassium: 3 mmol/L — ABNORMAL LOW (ref 3.5–5.1)
Sodium: 138 mmol/L (ref 135–145)
Total Bilirubin: 1 mg/dL (ref 0.3–1.2)
Total Protein: 7.7 g/dL (ref 6.0–8.3)

## 2014-08-04 LAB — I-STAT CHEM 8, ED
BUN: 16 mg/dL (ref 6–23)
Calcium, Ion: 1.15 mmol/L (ref 1.13–1.30)
Chloride: 94 mmol/L — ABNORMAL LOW (ref 96–112)
Creatinine, Ser: 0.7 mg/dL (ref 0.50–1.35)
Glucose, Bld: 189 mg/dL — ABNORMAL HIGH (ref 70–99)
HCT: 41 % (ref 39.0–52.0)
Hemoglobin: 13.9 g/dL (ref 13.0–17.0)
Potassium: 3.2 mmol/L — ABNORMAL LOW (ref 3.5–5.1)
Sodium: 139 mmol/L (ref 135–145)
TCO2: 24 mmol/L (ref 0–100)

## 2014-08-04 LAB — CBG MONITORING, ED: Glucose-Capillary: 132 mg/dL — ABNORMAL HIGH (ref 70–99)

## 2014-08-04 LAB — I-STAT TROPONIN, ED: Troponin i, poc: 0.01 ng/mL (ref 0.00–0.08)

## 2014-08-04 LAB — LIPASE, BLOOD: Lipase: 44 U/L (ref 11–59)

## 2014-08-04 MED ORDER — ONDANSETRON HCL 4 MG/2ML IJ SOLN
4.0000 mg | Freq: Once | INTRAMUSCULAR | Status: AC
  Administered 2014-08-04: 4 mg via INTRAVENOUS
  Filled 2014-08-04: qty 2

## 2014-08-04 MED ORDER — ALUM & MAG HYDROXIDE-SIMETH 200-200-20 MG/5ML PO SUSP
30.0000 mL | Freq: Once | ORAL | Status: AC
  Administered 2014-08-04: 30 mL via ORAL
  Filled 2014-08-04: qty 30

## 2014-08-04 NOTE — ED Provider Notes (Signed)
CSN: [id number]     Arrival date & time 08/04/14  2048 History   First MD Initiated Contact with Patient 08/04/14 2303     Chief Complaint  Patient presents with  . Abdominal Pain     (Consider location/radiation/quality/duration/timing/severity/associated sxs/prior Treatment) Patient is a 74 y.o. male presenting with abdominal pain. The history is provided by the patient.  Abdominal Pain Pain location:  Epigastric Pain quality: not sharp   Pain radiates to:  Does not radiate Pain severity:  Moderate Onset quality:  Gradual Duration:  12 weeks Timing:  Constant Progression:  Unchanged Chronicity:  New Context: not recent travel and not trauma   Relieved by:  Nothing Worsened by:  Nothing tried Associated symptoms: diarrhea and nausea   Associated symptoms: no anorexia and no fever   Risk factors: no recent hospitalization     Past Medical History  Diagnosis Date  . ED (erectile dysfunction)   . Rhinitis   . Hypertension   . Osteoarthritis   . CAD (coronary artery disease)     Minimal plaque cath 2008  . Rheumatic fever   . Diabetes mellitus   . Recurrent kidney stones    Past Surgical History  Procedure Laterality Date  . Inguinal hernia repair    . Colonoscopy  2006, 2010   Family History  Problem Relation Age of Onset  . Stroke Father 75    Died age 22  . Coronary artery disease Brother 78   History  Substance Use Topics  . Smoking status: Former Smoker    Quit date: 07/26/1981  . Smokeless tobacco: Not on file  . Alcohol Use: Yes    Review of Systems  Constitutional: Negative for fever.  Gastrointestinal: Positive for nausea, abdominal pain and diarrhea. Negative for anorexia.  All other systems reviewed and are negative.     Allergies  Review of patient's allergies indicates no known allergies.  Home Medications   Prior to Admission medications   Medication Sig Start Date End Date Taking? Authorizing Provider  aspirin 81 MG tablet Take 81  mg by mouth as needed for pain (pain).    Yes Historical Provider, MD  atenolol-chlorthalidone (TENORETIC) 50-25 MG per tablet TAKE ONE TABLET BY MOUTH ONE TIME DAILY  01/06/14  Yes Dorena Cookey, MD  glipiZIDE (GLUCOTROL) 5 MG tablet take 1 tablet by mouth prior to breakfast 05/05/14  Yes Dorena Cookey, MD  glucose blood (ACCU-CHEK AVIVA PLUS) test strip Use as instructed 06/22/14  Yes Dorena Cookey, MD  halobetasol (ULTRAVATE) 0.05 % cream Apply to the affected area twice daily  Patient taking differently: Apply to the affected area twice daily as needed 07/26/14  Yes Dorena Cookey, MD  Lancets (ACCU-CHEK MULTICLIX) lancets Use once daily.  E11.9 06/25/14  Yes Dorena Cookey, MD  loratadine (CLARITIN) 10 MG tablet Take 10 mg by mouth daily.   Yes Historical Provider, MD  metFORMIN (GLUCOPHAGE) 1000 MG tablet TAKE ONE TABLET BY MOUTH TWICE DAILY  07/12/14  Yes Dorena Cookey, MD  potassium chloride SA (K-DUR,KLOR-CON) 20 MEQ tablet TAKE ONE TABLET BY MOUTH THREE TIMES DAILY  06/28/14  Yes Dorena Cookey, MD  Pseudoeph-Bromphen-DM (COLD & COUGH DM PO) Take 1 tablet by mouth 3 (three) times daily as needed (cold symptoms).   Yes Historical Provider, MD  doxycycline (VIBRAMYCIN) 100 MG capsule Take 1 capsule (100 mg total) by mouth 2 (two) times daily. Patient not taking: Reported on 08/04/2014 11/10/13   Gwenyth Allegra  Pollina, MD  oxyCODONE-acetaminophen (PERCOCET) 5-325 MG per tablet Take 1 tablet by mouth every 4 (four) hours as needed. Patient not taking: Reported on 08/04/2014 11/10/13   Orpah Greek, MD   BP 144/65 mmHg  Pulse 78  Temp(Src) 97.8 F (36.6 C) (Oral)  Resp 14  SpO2 93% Physical Exam  Constitutional: He is oriented to person, place, and time. He appears well-developed and well-nourished. No distress.  HENT:  Head: Normocephalic and atraumatic.  Mouth/Throat: Oropharynx is clear and moist.  Eyes: Conjunctivae are normal. Pupils are equal, round, and reactive to light.   Neck: Normal range of motion. Neck supple.  Cardiovascular: Normal rate, regular rhythm and intact distal pulses.   Pulmonary/Chest: Effort normal. He has no wheezes. He has no rales.  Abdominal: Soft. Bowel sounds are normal. He exhibits no mass. There is no tenderness. There is no rebound and no guarding.  Musculoskeletal: Normal range of motion.  Neurological: He is alert and oriented to person, place, and time.  Skin: Skin is warm and dry.  Psychiatric: He has a normal mood and affect.    ED Course  Procedures (including critical care time) Labs Review Labs Reviewed  CBG MONITORING, ED - Abnormal; Notable for the following:    Glucose-Capillary 132 (*)    All other components within normal limits  CBC  COMPREHENSIVE METABOLIC PANEL  LIPASE, BLOOD  HEPATIC FUNCTION PANEL  D-DIMER, QUANTITATIVE  LIPASE, BLOOD  URINALYSIS, ROUTINE W REFLEX MICROSCOPIC  I-STAT TROPOININ, ED  I-STAT CHEM 8, ED    Imaging Review Dg Chest Port 1 View  08/04/2014   CLINICAL DATA:  Epigastric abdominal pain and vomiting for 24 hours.  EXAM: PORTABLE CHEST - 1 VIEW  COMPARISON:  Chest radiograph 12/07/2011  FINDINGS: Lung volumes are low leading to crowding of bronchovascular structures. Cardiomegaly is grossly unchanged allowing for differences in technique. No pulmonary edema, large pleural effusion, consolidation or pneumothorax. No acute osseous abnormalities are seen.  IMPRESSION: Hypoventilatory chest.  Grossly stable cardiomegaly.   Electronically Signed   By: Jeb Levering M.D.   On: 08/04/2014 21:39     EKG Interpretation None      MDM   Final diagnoses:  None   Medications  ondansetron (ZOFRAN) injection 4 mg (4 mg Intravenous Given 08/04/14 2235)  alum & mag hydroxide-simeth (MAALOX/MYLANTA) 200-200-20 MG/5ML suspension 30 mL (30 mLs Oral Given 08/04/14 2330)  sodium chloride 0.9 % bolus 1,000 mL (0 mLs Intravenous Stopped 08/05/14 0238)  potassium chloride SA (K-DUR,KLOR-CON) CR  tablet 40 mEq (40 mEq Oral Given 08/05/14 0138)  sodium chloride 0.9 % bolus 1,000 mL (0 mLs Intravenous Stopped 08/05/14 0324)  iohexol (OMNIPAQUE) 350 MG/ML injection 100 mL (100 mLs Intravenous Contrast Given 08/05/14 0326)  ketorolac (TORADOL) 30 MG/ML injection 30 mg (30 mg Intravenous Given 08/05/14 0514)     Date: 08/05/2014  Rate: 99  Rhythm: normal sinus rhythm  QRS Axis: normal  Intervals: normal  ST/T Wave abnormalities: normal  Conduction Disutrbances:none  Narrative Interpretation: multiple PVCs  Old EKG Reviewed: changes noted  Diarrhea is ongoing.  Will refer to PMD and GI will give pain medication.  Highly doubt panniculitis which is benign and usually requires no treatment is the cause of symptoms    Camarion Weier, MD 08/05/14 [phone]

## 2014-08-04 NOTE — ED Notes (Signed)
Pt complains of epigastric pain and vomiting that started today, he complains of diarrhea for one month. He states that his stomach feels empty but feels like something is stuck in the center of his chest.

## 2014-08-04 NOTE — ED Notes (Signed)
Notified EDP,[place] pt. i-stat Chem 8 results potassium 3.2.

## 2014-08-04 NOTE — ED Notes (Signed)
Pt reports he has been sick for several days, became worse today.  Pt reports epigastric pain that radiates to L side of his chest with n/v.  Pt unable to eat or drink d/t n/v.

## 2014-08-04 NOTE — ED Notes (Signed)
EKG given to Dr Tamera Punt

## 2014-08-05 ENCOUNTER — Encounter (HOSPITAL_COMMUNITY): Payer: Self-pay

## 2014-08-05 ENCOUNTER — Observation Stay (HOSPITAL_COMMUNITY)
Admission: EM | Admit: 2014-08-05 | Discharge: 2014-08-07 | Disposition: A | Discharge status: Home / Self Care | Payer: Medicare Other | Attending: Emergency Medicine | Admitting: Emergency Medicine

## 2014-08-05 ENCOUNTER — Telehealth: Payer: Self-pay | Admitting: Family Medicine

## 2014-08-05 ENCOUNTER — Emergency Department (HOSPITAL_COMMUNITY): Payer: Medicare Other

## 2014-08-05 DIAGNOSIS — I10 Essential (primary) hypertension: Secondary | ICD-10-CM | POA: Diagnosis present

## 2014-08-05 DIAGNOSIS — I517 Cardiomegaly: Secondary | ICD-10-CM | POA: Diagnosis not present

## 2014-08-05 DIAGNOSIS — R1110 Vomiting, unspecified: Secondary | ICD-10-CM | POA: Diagnosis not present

## 2014-08-05 DIAGNOSIS — R739 Hyperglycemia, unspecified: Secondary | ICD-10-CM | POA: Diagnosis present

## 2014-08-05 DIAGNOSIS — A09 Infectious gastroenteritis and colitis, unspecified: Secondary | ICD-10-CM

## 2014-08-05 DIAGNOSIS — R1319 Other dysphagia: Secondary | ICD-10-CM

## 2014-08-05 DIAGNOSIS — R112 Nausea with vomiting, unspecified: Secondary | ICD-10-CM | POA: Diagnosis not present

## 2014-08-05 DIAGNOSIS — E119 Type 2 diabetes mellitus without complications: Secondary | ICD-10-CM | POA: Diagnosis present

## 2014-08-05 DIAGNOSIS — R1310 Dysphagia, unspecified: Secondary | ICD-10-CM

## 2014-08-05 DIAGNOSIS — R1013 Epigastric pain: Secondary | ICD-10-CM | POA: Diagnosis not present

## 2014-08-05 DIAGNOSIS — E1159 Type 2 diabetes mellitus with other circulatory complications: Secondary | ICD-10-CM | POA: Diagnosis present

## 2014-08-05 DIAGNOSIS — K529 Noninfective gastroenteritis and colitis, unspecified: Secondary | ICD-10-CM | POA: Diagnosis present

## 2014-08-05 DIAGNOSIS — E1165 Type 2 diabetes mellitus with hyperglycemia: Secondary | ICD-10-CM | POA: Diagnosis present

## 2014-08-05 DIAGNOSIS — R0902 Hypoxemia: Secondary | ICD-10-CM | POA: Diagnosis not present

## 2014-08-05 DIAGNOSIS — J9811 Atelectasis: Secondary | ICD-10-CM | POA: Diagnosis not present

## 2014-08-05 DIAGNOSIS — R197 Diarrhea, unspecified: Secondary | ICD-10-CM | POA: Diagnosis not present

## 2014-08-05 LAB — COMPREHENSIVE METABOLIC PANEL
ALT: 14 U/L (ref 0–53)
AST: 17 U/L (ref 0–37)
Albumin: 3.5 g/dL (ref 3.5–5.2)
Alkaline Phosphatase: 51 U/L (ref 39–117)
Anion gap: 12 (ref 5–15)
BUN: 14 mg/dL (ref 6–23)
CO2: 29 mmol/L (ref 19–32)
Calcium: 8.5 mg/dL (ref 8.4–10.5)
Chloride: 101 mmol/L (ref 96–112)
Creatinine, Ser: 0.81 mg/dL (ref 0.50–1.35)
GFR calc Af Amer: >90 mL/min (ref >90)
GFR calc non Af Amer: 85 mL/min — ABNORMAL LOW (ref >90)
Glucose, Bld: 153 mg/dL — ABNORMAL HIGH (ref 70–99)
Potassium: 3.2 mmol/L — ABNORMAL LOW (ref 3.5–5.1)
Sodium: 142 mmol/L (ref 135–145)
Total Bilirubin: 0.8 mg/dL (ref 0.3–1.2)
Total Protein: 6.7 g/dL (ref 6.0–8.3)

## 2014-08-05 LAB — CBC WITH DIFFERENTIAL/PLATELET
Basophils Absolute: 0 10*3/uL (ref 0.0–0.1)
Basophils Absolute: 0 10*3/uL (ref 0.0–0.1)
Basophils Relative: 0 % (ref 0–1)
Basophils Relative: 1 % (ref 0–1)
Eosinophils Absolute: 0 10*3/uL (ref 0.0–0.7)
Eosinophils Absolute: 0 10*3/uL (ref 0.0–0.7)
Eosinophils Relative: 0 % (ref 0–5)
Eosinophils Relative: 0 % (ref 0–5)
HCT: 40.2 % (ref 39.0–52.0)
HCT: 37.9 % — ABNORMAL LOW (ref 39.0–52.0)
Hemoglobin: 13.2 g/dL (ref 13.0–17.0)
Hemoglobin: 12.7 g/dL — ABNORMAL LOW (ref 13.0–17.0)
Lymphocytes Relative: 10 % — ABNORMAL LOW (ref 12–46)
Lymphocytes Relative: 11 % — ABNORMAL LOW (ref 12–46)
Lymphs Abs: 0.7 10*3/uL (ref 0.7–4.0)
Lymphs Abs: 0.6 10*3/uL — ABNORMAL LOW (ref 0.7–4.0)
MCH: 32.3 pg (ref 26.0–34.0)
MCH: 32.5 pg (ref 26.0–34.0)
MCHC: 32.8 g/dL (ref 30.0–36.0)
MCHC: 33.5 g/dL (ref 30.0–36.0)
MCV: 98.3 fL (ref 78.0–100.0)
MCV: 96.9 fL (ref 78.0–100.0)
Monocytes Absolute: 0.6 10*3/uL (ref 0.1–1.0)
Monocytes Absolute: 0.6 10*3/uL (ref 0.1–1.0)
Monocytes Relative: 8 % (ref 3–12)
Monocytes Relative: 10 % (ref 3–12)
Neutro Abs: 6.1 10*3/uL (ref 1.7–7.7)
Neutro Abs: 4.2 10*3/uL (ref 1.7–7.7)
Neutrophils Relative %: 82 % — ABNORMAL HIGH (ref 43–77)
Neutrophils Relative %: 78 % — ABNORMAL HIGH (ref 43–77)
Platelets: 157 10*3/uL (ref 150–400)
Platelets: 145 10*3/uL — ABNORMAL LOW (ref 150–400)
RBC: 4.09 MIL/uL — ABNORMAL LOW (ref 4.22–5.81)
RBC: 3.91 MIL/uL — ABNORMAL LOW (ref 4.22–5.81)
RDW: 13.4 % (ref 11.5–15.5)
RDW: 13.5 % (ref 11.5–15.5)
WBC: 7.5 10*3/uL (ref 4.0–10.5)
WBC: 5.4 10*3/uL (ref 4.0–10.5)

## 2014-08-05 LAB — HEPATIC FUNCTION PANEL
ALT: 15 U/L (ref 0–53)
AST: 14 U/L (ref 0–37)
Albumin: 3.8 g/dL (ref 3.5–5.2)
Alkaline Phosphatase: 55 U/L (ref 39–117)
Bilirubin, Direct: 0.2 mg/dL (ref 0.0–0.5)
Indirect Bilirubin: 0.6 mg/dL (ref 0.3–0.9)
Total Bilirubin: 0.8 mg/dL (ref 0.3–1.2)
Total Protein: 6.9 g/dL (ref 6.0–8.3)

## 2014-08-05 LAB — URINALYSIS, ROUTINE W REFLEX MICROSCOPIC
Bilirubin Urine: NEGATIVE
Glucose, UA: NEGATIVE mg/dL
Hgb urine dipstick: NEGATIVE
Ketones, ur: >80 mg/dL — AB
Leukocytes, UA: NEGATIVE
Nitrite: NEGATIVE
Protein, ur: 30 mg/dL — AB
Specific Gravity, Urine: 1.02 (ref 1.005–1.030)
Urobilinogen, UA: 0.2 mg/dL (ref 0.0–1.0)
pH: 7.5 (ref 5.0–8.0)

## 2014-08-05 LAB — LIPASE, BLOOD
Lipase: 44 U/L (ref 11–59)
Lipase: 46 U/L (ref 11–59)

## 2014-08-05 LAB — URINE MICROSCOPIC-ADD ON

## 2014-08-05 LAB — D-DIMER, QUANTITATIVE (NOT AT ARMC): D-Dimer, Quant: 0.56 ug/mL-FEU — ABNORMAL HIGH (ref 0.00–0.48)

## 2014-08-05 MED ORDER — POTASSIUM CHLORIDE CRYS ER 20 MEQ PO TBCR
40.0000 meq | EXTENDED_RELEASE_TABLET | Freq: Once | ORAL | Status: AC
  Administered 2014-08-05: 40 meq via ORAL
  Filled 2014-08-05: qty 2

## 2014-08-05 MED ORDER — ASPIRIN 81 MG PO CHEW: 81.0000 mg | CHEWABLE_TABLET | ORAL | Status: DC | PRN

## 2014-08-05 MED ORDER — IOHEXOL 350 MG/ML SOLN
100.0000 mL | Freq: Once | INTRAVENOUS | Status: AC | PRN
Start: 2014-08-05 — End: 2014-08-05
  Administered 2014-08-05: 100 mL via INTRAVENOUS

## 2014-08-05 MED ORDER — ONDANSETRON HCL 4 MG/2ML IJ SOLN
4.0000 mg | Freq: Once | INTRAMUSCULAR | Status: AC
  Administered 2014-08-05: 4 mg via INTRAVENOUS
  Filled 2014-08-05: qty 2

## 2014-08-05 MED ORDER — ATENOLOL 50 MG PO TABS: 50.0000 mg | ORAL_TABLET | Freq: Every day | ORAL | Status: DC

## 2014-08-05 MED ORDER — SODIUM CHLORIDE 0.9 % IV BOLUS (SEPSIS)
1000.0000 mL | Freq: Once | INTRAVENOUS | Status: AC
  Administered 2014-08-05: 1000 mL via INTRAVENOUS

## 2014-08-05 MED ORDER — MAGNESIUM SULFATE IN D5W 10-5 MG/ML-% IV SOLN
1.0000 g | Freq: Once | INTRAVENOUS | Status: DC
  Filled 2014-08-05: qty 100

## 2014-08-05 MED ORDER — HYDROCODONE-ACETAMINOPHEN 5-325 MG PO TABS: 1.0000 | ORAL_TABLET | Freq: Four times a day (QID) | ORAL | Status: DC | PRN

## 2014-08-05 MED ORDER — PROMETHAZINE HCL 25 MG/ML IJ SOLN
25.0000 mg | Freq: Once | INTRAMUSCULAR | Status: AC
  Administered 2014-08-05: 25 mg via INTRAMUSCULAR
  Filled 2014-08-05: qty 1

## 2014-08-05 MED ORDER — ASPIRIN 81 MG PO CHEW
81.0000 mg | CHEWABLE_TABLET | Freq: Every day | ORAL | Status: DC
  Administered 2014-08-06 – 2014-08-07 (×2): 81 mg via ORAL
  Filled 2014-08-05 (×2): qty 1

## 2014-08-05 MED ORDER — HEPARIN SODIUM (PORCINE) 5000 UNIT/ML IJ SOLN
5000.0000 [IU] | Freq: Three times a day (TID) | INTRAMUSCULAR | Status: DC
  Administered 2014-08-05 – 2014-08-07 (×5): 5000 [IU] via SUBCUTANEOUS
  Filled 2014-08-05 (×5): qty 1

## 2014-08-05 MED ORDER — GUAIFENESIN 100 MG/5ML PO SOLN
5.0000 mL | ORAL | Status: DC | PRN
  Filled 2014-08-05: qty 5

## 2014-08-05 MED ORDER — KETOROLAC TROMETHAMINE 30 MG/ML IJ SOLN
30.0000 mg | Freq: Once | INTRAMUSCULAR | Status: AC
  Administered 2014-08-05: 30 mg via INTRAVENOUS
  Filled 2014-08-05: qty 1

## 2014-08-05 MED ORDER — ATENOLOL 50 MG PO TABS
50.0000 mg | ORAL_TABLET | Freq: Every day | ORAL | Status: DC
  Administered 2014-08-06 – 2014-08-07 (×2): 50 mg via ORAL
  Filled 2014-08-05 (×2): qty 1

## 2014-08-05 MED ORDER — PROMETHAZINE HCL 25 MG/ML IJ SOLN
12.5000 mg | Freq: Four times a day (QID) | INTRAMUSCULAR | Status: DC | PRN
  Administered 2014-08-05: 12.5 mg via INTRAVENOUS
  Filled 2014-08-05: qty 1

## 2014-08-05 MED ORDER — SODIUM CHLORIDE 0.9 % IV SOLN
INTRAVENOUS | Status: DC
  Administered 2014-08-05 – 2014-08-06 (×2): via INTRAVENOUS
  Administered 2014-08-07: 1 mL via INTRAVENOUS

## 2014-08-05 MED ORDER — LORATADINE 10 MG PO TABS
10.0000 mg | ORAL_TABLET | Freq: Every day | ORAL | Status: DC
  Administered 2014-08-06 – 2014-08-07 (×2): 10 mg via ORAL
  Filled 2014-08-05 (×2): qty 1

## 2014-08-05 NOTE — ED Notes (Signed)
Call [facility] when the pt is ready to go home at [phone] or on cell at [phone]

## 2014-08-05 NOTE — Telephone Encounter (Signed)
Spoke with patient and per Dr Sherren Mocha patient should go back to the ER.  Patient is aware and agrees.

## 2014-08-05 NOTE — ED Notes (Signed)
Patient transported to CT 

## 2014-08-05 NOTE — Telephone Encounter (Signed)
Pt called and said he went to ER this morning because of nausea and he could not stop throwing up. He said they ran test on him and could not find anything wrong and said he may need to see a GI. Pt said he is home and still having issues and would like a call back.

## 2014-08-05 NOTE — Discharge Instructions (Signed)
°Emergency Department Resource Guide °1) Find a Doctor and Pay Out of Pocket °Although you won't have to find out who is covered by your insurance plan, it is a good idea to ask around and get recommendations. You will then need to call the office and see if the doctor you have chosen will accept you as a new patient and what types of options they offer for patients who are self-pay. Some doctors offer discounts or will set up payment plans for their patients who do not have insurance, but you will need to ask so you aren't surprised when you get to your appointment. ° °2) Contact Your Local Health Department °Not all health departments have doctors that can see patients for sick visits, but many do, so it is worth a call to see if yours does. If you don't know where your local health department is, you can check in your phone book. The CDC also has a tool to help you locate your state's health department, and many state websites also have listings of all of their local health departments. ° °3) Find a Walk-in Clinic °If your illness is not likely to be very severe or complicated, you may want to try a walk in clinic. These are popping up all over the country in pharmacies, drugstores, and shopping centers. They're usually staffed by nurse practitioners or physician assistants that have been trained to treat common illnesses and complaints. They're usually fairly quick and inexpensive. However, if you have serious medical issues or chronic medical problems, these are probably not your best option. ° °No Primary Care Doctor: °- Call [facility] at  832-8000 - they can help you locate a primary care doctor that  accepts your insurance, provides certain services, etc. °- Physician Referral Service- 1-800-533-3463 ° °Chronic Pain Problems: °Organization         Address  Phone   Notes  °[facility]  (336) 297-2271 Patients need to be referred by their primary care doctor.  ° °Medication  Assistance: °Organization         Address  Phone   Notes  °[place] [facility] [address] °[place], [place] [address] (336) 641-8030 --Must be a resident of [place] °-- Must have NO insurance coverage whatsoever (no Medicaid/ Medicare, etc.) °-- The pt. MUST have a primary care doctor that directs their care regularly and follows them in the community °  °[facility]  (866) 331-1348   °[employer]  (888) 892-1162   ° °Agencies that provide inexpensive medical care: °Organization         Address  Phone   Notes  °[facility]  (336) 832-8035   °[facility]    (336) 832-7272   °[facility] [address] °[place],  [address] (336) 832-4777   °[facility] [address], °[place] (336) 271-4999   °[facility]    (336) 373-0678   °[facility]    (336) 272-1050   °[facility] ° [address], [place] Phone:  (336) 832-4444, Fax:  (336) 832-4440 Hours of Operation:  9 am - 6 pm, M-F.  Also accepts Medicaid/Medicare and self-pay.  °[facility] ° [address], [place] Phone: (336) 832-3150, Fax: (336) [phone]. Hours of Operation:  8:30 am - 5:30 pm, M-F.  Also accepts Medicaid and self-pay.  °[facility] [place] [address], [place] Phone: (336) 878-6027   °[facility] [address], [place], [place] (336)723-1848, Ext. 123 Mondays & Thursdays: 7-9 AM.  First 15 patients are seen on a first come, first serve basis. °  ° °Medicaid-accepting [place] Providers: ° °Organization         Address  Phone   Notes  °[facility] [address], [place] (336) 641-2100 Also accepts self-pay patients.  °[facility] [address], [place] ° (336) 856-9996   °[facility] [address], [place]  (336) 288-8857   °[facility] [address] [place] [address], [place] (336) 299-7000   °Veita Bland [address], [place]  ° (336) 373-1557 Only accepts [place] Access Medicaid patients after they have their name applied to their card.  ° °Self-Pay (no insurance) in [place]: ° °Organization         Address  Phone   Notes  °Sickle Cell Patients, [facility] [address], [place] (336) 832-1970   °[facility] [address], [place] (336) 832-4400   °[facility] ° [address], [place] (336) 992-4800   °[facility]/Dr. Osei-Bonsu ° [address] [place] [address], [place] or [address], [place] (336) 841-8500 Phone number for both [place] and [place] locations is the same.  °[facility] [address], [place] (336) 299-0000   °[facility] [address] [place] [address], [place] or [address] (336) 852-7530 °(336) 878-2260   °[facility] [address], [place] (336) 350-1642, phone; (336) 294-5005, fax Sees patients 1st and 3rd Saturday of every month.  Must not qualify for public or private insurance (i.e. Medicaid, Medicare, [place] Health Choice, Veterans' Benefits) • Household income should be no more than 200% of the poverty level •The clinic cannot treat you if you are pregnant or think you are pregnant • Sexually transmitted diseases are not treated at the clinic.  ° ° °Dental Care: °Organization         Address  Phone  Notes  °[place] [facility] [address], [place] (336) 641-6152 Accepts children up to age 21 who are enrolled in Medicaid or [place] Health Choice; pregnant women with a Medicaid card; and children who have applied for Medicaid or [place] Health Choice, but were declined, whose parents can pay a reduced fee at time of service.  °[place]  [facility] [place]  [address], [place] (336) 641-7733 Accepts children up to age 21 who are enrolled in Medicaid or [place] Health Choice; pregnant women with a Medicaid card; and children who have applied for Medicaid or  Health Choice, but were declined, whose parents can pay a reduced fee at time of service.  °[facility] ° [address],  (336) 641-4533 Patients are seen by appointment only. Walk-ins are not accepted. [facility] will see patients 18 years of age and older. °Monday - Tuesday (8am-5pm) °Most Wednesdays (8:30-5pm) °$30 per visit, cash only  °[facility] ° [address], [place] (336) 641-4533 Patients are seen by appointment only. Walk-ins are not accepted. [facility] will see patients 18 years of age and older. °One   Wednesday Evening (Monthly: Volunteer Based).  $30 per visit, cash only  °[facility]  (919) 537-3737 for adults; Children under age 4, call Graduate Pediatric Dentistry at ([phone]) [phone]. Children aged 4-14, please call (919) [phone] to request a pediatric application. ° Dental services are provided in all areas of dental care including fillings, crowns and bridges, complete and partial dentures, implants, gum treatment, root canals, and extractions. Preventive care is also provided. Treatment is provided to both adults and children. °Patients are selected via a lottery and there is often a waiting list. °  °[facility] [address], °[place] ° (336) 763-8833 [email or web address] °  °[facility] [address], [place], [place] (336)723-1848, Ext. 123 Second and Fourth Thursday of each month, opens at 6:30 AM; Clinic ends at 9 AM.  Patients are seen on a first-come first-served basis, and a limited number are seen during each clinic.  ° °[facility] ° [address], [place], [place] (336) 723-7904    Eligibility Requirements °You must have lived in [place], [place], or [place] counties for at least the last three months. °  You cannot be eligible for state or federal sponsored healthcare insurance, including Veterans Administration, Medicaid, or Medicare. °  You generally cannot be eligible for healthcare insurance through your employer.  °  How to apply: °Eligibility screenings are held every Tuesday and Wednesday afternoon from 1:00 pm until 4:00 pm. You do not need an appointment for the interview!  °[facility] [address], [place], [place] 336-631-2330   °[facility] [facility]  336-342-8273   °[facility]  336-703-3100   °[facility]  336-570-6415   ° °Behavioral Health Resources in the Community: °Intensive Outpatient Programs °Organization         Address  Phone  Notes  °[facility] [address], [place], [place] 336-878-6098   °[facility] [address], [place], [place] 336-832-9800   °[facility] [address], [place], [place] ° 336-882-2125   °[facility] [address],  °[place], [place] 1-800-853-5163 or 336-641-4981   °Substance Abuse Resources °Organization         Address  Phone  Notes  °[facility]  336-882-2125   °[facility]  336-784-9470   °[facility]  336-285-9073   °[facility]  336-845-3988   °Residential & Outpatient Substance Abuse Program  1-800-659-3381   °Psychological Services °Organization         Address  Phone  Notes  ° Health  336- 832-9600   °[facility]  336- 378-7881   °[facility] [address], [place] 1-800-853-5163 or 336-641-4981   ° °Mobile Crisis Teams °Organization         Address  Phone  Notes  °[facility], Mobile Crisis Care Unit  1-877-626-1772   °Assertive °[facility] ° [address]  [place], [place] 336-834-9664   °Sharon DeEsch [address] ° [place] 336-554-5454   ° °Self-Help/Support Groups °Organization         Address  Phone             Notes  °[facility]  - variety of support groups  336- 373-1402 Call for more information  °Narcotics Anonymous (NA), [facility] [address], °[place]  2 meetings at this location  ° °  Residential Treatment Programs °Organization         Address  Phone  Notes  °[facility] [address],    °[place]  1-866-801-8205   °[facility] ° [address], [place], [place] 704-293-8524   °[facility] [address], [place] 336-845-3988 Admissions: 8am-3pm M-F  °[facility] [address]    °[place], [place] 336-841-1104   °[facility] [address], [place], [place] 336-379-7146   °[facility] [address]  °[place], [place] 336-285-9073   °[facility] Intensive Outpatient [address], [place], [place] 336-852-3033   °[facility] [address]  °[place], [place] 1-877-615-2722 or 336-784-9470   °[facility] [address] [place], [place] 336-227-7417 Accepts Medicaid  °[facility] [address]  °[place] 1-800-659-3381 Substance Abuse/Addiction Treatment  ° °[place] Behavioral Health Resources °Organization         Address  Phone  Notes  °[facility]  (888) 581-9988   °Julie Brannon, PhD [address] [place], [place]   (336) 349-5553 or (336) 951-0000   °[facility]   [address] °[place], [place] (336) 349-4454   °[facility] [address], [place], [place] (336) 342-8316 Insurance/Medicaid/sponsorship through [facility]  °[facility] [address]                                    [place] (336) 342-8316 Therapy/tele-psych/case    °[facility] [address]  ° [place], [place] (336) 349-2233    °Dr. Arfeen  (336) 349-4544   °[facility] [place]  [employer] [place] [facility] 1) [address], [place] °2) [address], [place] °3)  [address], [place] (336) 349-3220 °(336) 342-7768 ° °(336) 342-8140   °[place] [facility] (336) 342-1394 or (336) 342-3537 (After Hours)    ° ° °

## 2014-08-05 NOTE — Telephone Encounter (Signed)
Spoke with patient and he had nausea starting 2 days ago.  He started vomiting yesterday and went to the ER.  He was released today but he is still vomiting and unable to keep even water down.  Please advise.

## 2014-08-05 NOTE — ED Notes (Signed)
Called Mr Rays to come pick up pt

## 2014-08-05 NOTE — ED Notes (Signed)
Returned from [place].

## 2014-08-05 NOTE — ED Notes (Signed)
Pt. Is from home. Complaint of N/V for approx 4 days. Had diarrhea initially but not for the past few days. Pt. Was seen at [facility] yesterday, given prescription for norco but no zofran prescription. Pt. States he did not need pain medication. Pt. Also received referral to GI doc from [facility] but when his wife called for appointment that stated they had not received in referral paperwork. Pt. Called PCP today and PCP told pt. To return to ED.

## 2014-08-05 NOTE — ED Notes (Signed)
[name] [phone] cell phone [phone] call him when the pt is released and ready to go home.

## 2014-08-05 NOTE — ED Provider Notes (Signed)
CSN: [id number]     Arrival date & time 08/05/14  1608 History   First MD Initiated Contact with Patient 08/05/14 1610     Chief Complaint  Patient presents with  . Emesis     The history is provided by the patient. No language interpreter was used.   [name] presents for evaluation of vomiting. Reports that he's had 3 days of vomiting approximately 4-5 times daily. He reports the emesis is bilious in appearance. There is no hematemesis. He had diarrhea 3 days ago, and this is now resolved. He had a small bowel movement this morning. He denies any hematochezia. He has epigastric pain that occurs with vomiting and just prior to vomiting. It is described as a vague pain that is like a strong nausea sensation. Symptoms are moderate, constant, worsening. He denies any chest pain, shortness of breath.  Past Medical History  Diagnosis Date  . ED (erectile dysfunction)   . Rhinitis   . Hypertension   . Osteoarthritis   . CAD (coronary artery disease)     Minimal plaque cath 2008  . Rheumatic fever   . Diabetes mellitus   . Recurrent kidney stones    Past Surgical History  Procedure Laterality Date  . Inguinal hernia repair    . Colonoscopy  2006, 2010   Family History  Problem Relation Age of Onset  . Stroke Father 41    Died age 15  . Coronary artery disease Brother 47   History  Substance Use Topics  . Smoking status: Former Smoker    Quit date: 07/26/1981  . Smokeless tobacco: Not on file  . Alcohol Use: 1.8 oz/week    3 Cans of beer per week    Review of Systems  All other systems reviewed and are negative.     Allergies  Review of patient's allergies indicates no known allergies.  Home Medications   Prior to Admission medications   Medication Sig Start Date End Date Taking? Authorizing Provider  aspirin 81 MG tablet Take 81 mg by mouth as needed for pain (pain).     Historical Provider, MD  atenolol-chlorthalidone (TENORETIC) 50-25 MG per tablet TAKE ONE  TABLET BY MOUTH ONE TIME DAILY  01/06/14   Dorena Cookey, MD  doxycycline (VIBRAMYCIN) 100 MG capsule Take 1 capsule (100 mg total) by mouth 2 (two) times daily. Patient not taking: Reported on 08/04/2014 11/10/13   Orpah Greek, MD  glipiZIDE (GLUCOTROL) 5 MG tablet take 1 tablet by mouth prior to breakfast 05/05/14   Dorena Cookey, MD  glucose blood (ACCU-CHEK AVIVA PLUS) test strip Use as instructed 06/22/14   Dorena Cookey, MD  halobetasol (ULTRAVATE) 0.05 % cream Apply to the affected area twice daily  Patient taking differently: Apply to the affected area twice daily as needed 07/26/14   Dorena Cookey, MD  HYDROcodone-acetaminophen (NORCO) 5-325 MG per tablet Take 1 tablet by mouth every 6 (six) hours as needed. 08/05/14   April Palumbo, MD  Lancets (ACCU-CHEK MULTICLIX) lancets Use once daily.  E11.9 06/25/14   Dorena Cookey, MD  loratadine (CLARITIN) 10 MG tablet Take 10 mg by mouth daily.    Historical Provider, MD  metFORMIN (GLUCOPHAGE) 1000 MG tablet TAKE ONE TABLET BY MOUTH TWICE DAILY  07/12/14   Dorena Cookey, MD  oxyCODONE-acetaminophen (PERCOCET) 5-325 MG per tablet Take 1 tablet by mouth every 4 (four) hours as needed. Patient not taking: Reported on 08/04/2014 11/10/13   Harrell Gave  Hewitt Shorts, MD  potassium chloride SA (K-DUR,KLOR-CON) 20 MEQ tablet TAKE ONE TABLET BY MOUTH THREE TIMES DAILY  06/28/14   Dorena Cookey, MD  Pseudoeph-Bromphen-DM (COLD & COUGH DM PO) Take 1 tablet by mouth 3 (three) times daily as needed (cold symptoms).    Historical Provider, MD   BP 147/83 mmHg  Pulse 86  Temp(Src) 98.3 F (36.8 C) (Oral)  Resp 16  SpO2 96% Physical Exam  Constitutional: He is oriented to person, place, and time. He appears well-developed and well-nourished.  HENT:  Head: Normocephalic and atraumatic.  Cardiovascular: Normal rate and regular rhythm.   No murmur heard. Pulmonary/Chest: Effort normal and breath sounds normal. No respiratory distress.  Abdominal: Soft.  There is no rebound and no guarding.  Mild epigastric tenderness  Musculoskeletal: He exhibits no edema or tenderness.  Neurological: He is alert and oriented to person, place, and time.  Skin: Skin is warm and dry.  Psychiatric: He has a normal mood and affect. His behavior is normal.  Nursing note and vitals reviewed.   ED Course  Procedures (including critical care time) Labs Review Labs Reviewed  COMPREHENSIVE METABOLIC PANEL - Abnormal; Notable for the following:    Potassium 3.2 (*)    Glucose, Bld 153 (*)    GFR calc non Af Amer 85 (*)    All other components within normal limits  CBC WITH DIFFERENTIAL/PLATELET - Abnormal; Notable for the following:    RBC 3.91 (*)    Hemoglobin 12.7 (*)    HCT 37.9 (*)    Platelets 145 (*)    Neutrophils Relative % 78 (*)    Lymphocytes Relative 11 (*)    Lymphs Abs 0.6 (*)    All other components within normal limits  LIPASE, BLOOD  CBC  BASIC METABOLIC PANEL    Imaging Review Ct Angio Chest Pe W/cm &/or Wo Cm  08/05/2014   CLINICAL DATA:  Left chest pain. Epigastric pain. Nausea and vomiting for 1 day. Diarrhea for 1 month.  EXAM: CT ANGIOGRAPHY CHEST  CT ABDOMEN AND PELVIS WITH CONTRAST  TECHNIQUE: Multidetector CT imaging of the chest was performed using the standard protocol during bolus administration of intravenous contrast. Multiplanar CT image reconstructions and MIPs were obtained to evaluate the vascular anatomy. Multidetector CT imaging of the abdomen and pelvis was performed using the standard protocol during bolus administration of intravenous contrast.  CONTRAST:  167mL OMNIPAQUE IOHEXOL 350 MG/ML SOLN  COMPARISON:  Chest radiograph 1 day prior.  FINDINGS: CTA CHEST FINDINGS  There are no filling defects within the pulmonary arteries to suggest pulmonary embolus.  The thoracic aorta is normal in caliber with mild atherosclerosis. No aneurysm or evidence of dissection. The heart is mildly enlarged. There are coronary artery  calcifications. No mediastinal or hilar adenopathy. No pleural or pericardial effusion.  Scattered atelectasis in the lungs. There is peripheral septal thickening. Mild hypoventilatory changes dependently. No consolidation to suggest pneumonia. Trachea is midline.  Degenerative change throughout the thoracic spine. No acute or suspicious osseous abnormality  CT ABDOMEN and PELVIS FINDINGS  Clips in the gallbladder fossa from cholecystectomy. The liver, spleen, pancreas, and adrenal glands are normal. There is a 2 cm cyst in the lower right kidney. No hydronephrosis. No renal stones. There is mild nonspecific perinephric stranding.  The stomach is distended with ingested contents. There is a duodenal diverticulum arising from the third portion of the duodenum. There are no dilated or thickened bowel loops. Small volume of colonic stool.  There is diverticulosis of the descending and sigmoid colon without diverticulitis. There is fluid distending the distal sigmoid colon and rectum. No free air, free fluid, or intra-abdominal fluid collection. The appendix is normal.  The abdominal aorta is normal in caliber with moderate to dense atherosclerosis. No aneurysm. No retroperitoneal adenopathy. There is minimal haziness in the central mesenteric. No mesenteric adenopathy.  Within the pelvis the urinary bladder is physiologically distended. Prostate gland appears prominent measuring 5.6 cm in transverse dimension. There is fat in the right inguinal canal. No pelvic free fluid. No pelvic adenopathy.  There is multilevel degenerative change throughout the lower thoracic and lumbar spine. No acute or suspicious osseous abnormality.  Review of the MIP images confirms the above findings.  IMPRESSION: 1. No pulmonary embolus. 2. Cardiomegaly. There is smooth septal thickening that may reflect early pulmonary edema. 3. Mild mesenteric haziness in the central abdomen suggesting mild panniculitis. 4. Fluid distending the distal  colon and rectum, can be seen in the setting of diarrhea/mild colitis. 5. Incidental findings of colonic diverticulosis without diverticulitis, duodenal diverticulum, and right renal cyst. 6. Atherosclerosis of the thoracoabdominal aorta without aneurysm.   Electronically Signed   By: Jeb Levering M.D.   On: 08/05/2014 04:36   Dg Chest Port 1 View  08/04/2014   CLINICAL DATA:  Epigastric abdominal pain and vomiting for 24 hours.  EXAM: PORTABLE CHEST - 1 VIEW  COMPARISON:  Chest radiograph 12/07/2011  FINDINGS: Lung volumes are low leading to crowding of bronchovascular structures. Cardiomegaly is grossly unchanged allowing for differences in technique. No pulmonary edema, large pleural effusion, consolidation or pneumothorax. No acute osseous abnormalities are seen.  IMPRESSION: Hypoventilatory chest.  Grossly stable cardiomegaly.   Electronically Signed   By: Jeb Levering M.D.   On: 08/04/2014 21:39   Ct Angio Abd/pel W/ And/or W/o  08/05/2014   CLINICAL DATA:  Left chest pain. Epigastric pain. Nausea and vomiting for 1 day. Diarrhea for 1 month.  EXAM: CT ANGIOGRAPHY CHEST  CT ABDOMEN AND PELVIS WITH CONTRAST  TECHNIQUE: Multidetector CT imaging of the chest was performed using the standard protocol during bolus administration of intravenous contrast. Multiplanar CT image reconstructions and MIPs were obtained to evaluate the vascular anatomy. Multidetector CT imaging of the abdomen and pelvis was performed using the standard protocol during bolus administration of intravenous contrast.  CONTRAST:  155mL OMNIPAQUE IOHEXOL 350 MG/ML SOLN  COMPARISON:  Chest radiograph 1 day prior.  FINDINGS: CTA CHEST FINDINGS  There are no filling defects within the pulmonary arteries to suggest pulmonary embolus.  The thoracic aorta is normal in caliber with mild atherosclerosis. No aneurysm or evidence of dissection. The heart is mildly enlarged. There are coronary artery calcifications. No mediastinal or hilar  adenopathy. No pleural or pericardial effusion.  Scattered atelectasis in the lungs. There is peripheral septal thickening. Mild hypoventilatory changes dependently. No consolidation to suggest pneumonia. Trachea is midline.  Degenerative change throughout the thoracic spine. No acute or suspicious osseous abnormality  CT ABDOMEN and PELVIS FINDINGS  Clips in the gallbladder fossa from cholecystectomy. The liver, spleen, pancreas, and adrenal glands are normal. There is a 2 cm cyst in the lower right kidney. No hydronephrosis. No renal stones. There is mild nonspecific perinephric stranding.  The stomach is distended with ingested contents. There is a duodenal diverticulum arising from the third portion of the duodenum. There are no dilated or thickened bowel loops. Small volume of colonic stool. There is diverticulosis of the descending and sigmoid  colon without diverticulitis. There is fluid distending the distal sigmoid colon and rectum. No free air, free fluid, or intra-abdominal fluid collection. The appendix is normal.  The abdominal aorta is normal in caliber with moderate to dense atherosclerosis. No aneurysm. No retroperitoneal adenopathy. There is minimal haziness in the central mesenteric. No mesenteric adenopathy.  Within the pelvis the urinary bladder is physiologically distended. Prostate gland appears prominent measuring 5.6 cm in transverse dimension. There is fat in the right inguinal canal. No pelvic free fluid. No pelvic adenopathy.  There is multilevel degenerative change throughout the lower thoracic and lumbar spine. No acute or suspicious osseous abnormality.  Review of the MIP images confirms the above findings.  IMPRESSION: 1. No pulmonary embolus. 2. Cardiomegaly. There is smooth septal thickening that may reflect early pulmonary edema. 3. Mild mesenteric haziness in the central abdomen suggesting mild panniculitis. 4. Fluid distending the distal colon and rectum, can be seen in the setting  of diarrhea/mild colitis. 5. Incidental findings of colonic diverticulosis without diverticulitis, duodenal diverticulum, and right renal cyst. 6. Atherosclerosis of the thoracoabdominal aorta without aneurysm.   Electronically Signed   By: Jeb Levering M.D.   On: 08/05/2014 04:36     EKG Interpretation None      MDM   Final diagnoses:  Intractable vomiting with nausea, vomiting of unspecified type    Patient here for evaluation of vomiting. Abdominal exam is benign, recently had CT chest abdomen pelvis. Hemoglobin slightly lower from prior lab draw, patient has no history of bleeding.  Patient with persistent vomiting in the emergency department despite multiple rounds of antiemetics, medicine consultation for observation admission.    Quintella Reichert, MD 08/06/14 [id number]

## 2014-08-05 NOTE — H&P (Signed)
[facility] History and Physical  [name]:[id number] DOB: 29-Dec-1940 DOA: 08/05/2014  Referring physician: EDP PCP: No PCP Per Patient   Chief Complaint: N/V/D   HPI: [name] is a 74 y.o. male who presents to ED with c/o N/V/D for about 4 days.  Had diarrhea initially but this resolved.  Has had persistent nausea and vomiting.  Unable to keep down even water right now.  Seen yesterday at [facility], was given script for norco (which patient says he didn't need pain meds) but wasn't given any anti-emetics.  N/V has persisted and so he returns to ED today.  Review of Systems: Systems reviewed.  As above, otherwise negative  Past Medical History  Diagnosis Date  . ED (erectile dysfunction)   . Rhinitis   . Hypertension   . Osteoarthritis   . CAD (coronary artery disease)     Minimal plaque cath 2008  . Rheumatic fever   . Diabetes mellitus   . Recurrent kidney stones    Past Surgical History  Procedure Laterality Date  . Inguinal hernia repair    . Colonoscopy  2006, 2010   Social History:  reports that he quit smoking about 33 years ago. He does not have any smokeless tobacco history on file. He reports that he drinks about 1.8 oz of alcohol per week. He reports that he does not use illicit drugs.  No Known Allergies  Family History  Problem Relation Age of Onset  . Stroke Father 72    Died age 83  . Coronary artery disease Brother 45     Prior to Admission medications   Medication Sig Start Date End Date Taking? Authorizing Provider  aspirin 81 MG tablet Take 81 mg by mouth as needed for pain (pain).    Yes Historical Provider, MD  atenolol-chlorthalidone (TENORETIC) 50-25 MG per tablet TAKE ONE TABLET BY MOUTH ONE TIME DAILY  01/06/14  Yes Dorena Cookey, MD  DM-Doxylamine-Acetaminophen (NYQUIL COLD & FLU PO) Take 30 mLs by mouth daily as needed (FOR COLD).   Yes Historical Provider, MD  Ginger, Zingiber officinalis, (GINGER PO) Take 1 capsule by mouth  daily.   Yes Historical Provider, MD  glipiZIDE (GLUCOTROL) 5 MG tablet take 1 tablet by mouth prior to breakfast 05/05/14  Yes Dorena Cookey, MD  guaiFENesin (ROBITUSSIN) 100 MG/5ML SOLN Take 5 mLs by mouth every 4 (four) hours as needed for cough or to loosen phlegm.   Yes Historical Provider, MD  halobetasol (ULTRAVATE) 0.05 % cream Apply to the affected area twice daily  Patient taking differently: Apply to the affected area twice daily as needed for rash 07/26/14  Yes Dorena Cookey, MD  loratadine (CLARITIN) 10 MG tablet Take 10 mg by mouth daily.   Yes Historical Provider, MD  metFORMIN (GLUCOPHAGE) 1000 MG tablet TAKE ONE TABLET BY MOUTH TWICE DAILY  07/12/14  Yes Dorena Cookey, MD  potassium chloride SA (K-DUR,KLOR-CON) 20 MEQ tablet TAKE ONE TABLET BY MOUTH THREE TIMES DAILY  06/28/14  Yes Dorena Cookey, MD  Pseudoeph-Bromphen-DM (COLD & COUGH DM PO) Take 1 tablet by mouth 3 (three) times daily as needed (cold symptoms).   Yes Historical Provider, MD   Physical Exam: Filed Vitals:   08/05/14 2000  BP: 159/93  Pulse: 72  Temp:   Resp: 16    BP 159/93 mmHg  Pulse 72  Temp(Src) 99 F (37.2 C) (Oral)  Resp 16  SpO2 95%  General Appearance:  Alert, oriented, no distress, appears stated age  Head:    Normocephalic, atraumatic  Eyes:    PERRL, EOMI, sclera non-icteric        Nose:   Nares without drainage or epistaxis. Mucosa, turbinates normal  Throat:   Moist mucous membranes. Oropharynx without erythema or exudate.  Neck:   Supple. No carotid bruits.  No thyromegaly.  No lymphadenopathy.   Back:     No CVA tenderness, no spinal tenderness  Lungs:     Clear to auscultation bilaterally, without wheezes, rhonchi or rales  Chest wall:    No tenderness to palpitation  Heart:    Regular rate and rhythm without murmurs, gallops, rubs  Abdomen:     Soft, non-tender, nondistended, normal bowel sounds, no organomegaly  Genitalia:    deferred  Rectal:    deferred  Extremities:   No  clubbing, cyanosis or edema.  Pulses:   2+ and symmetric all extremities  Skin:   Skin color, texture, turgor normal, no rashes or lesions  Lymph nodes:   Cervical, supraclavicular, and axillary nodes normal  Neurologic:   CNII-XII intact. Normal strength, sensation and reflexes      throughout    Labs on Admission:  Basic Metabolic Panel:  Recent Labs Lab 08/04/14 2121 08/04/14 2340 08/05/14 1633  NA 138 139 142  K 3.0* 3.2* 3.2*  CL 96 94* 101  CO2 31  --  29  GLUCOSE 132* 189* 153*  BUN 15 16 14   CREATININE 0.68 0.70 0.81  CALCIUM 9.1  --  8.5   Liver Function Tests:  Recent Labs Lab 08/04/14 2121 08/04/14 2142 08/05/14 1633  AST 19 14 17   ALT 15 15 14   ALKPHOS 59 55 51  BILITOT 1.0 0.8 0.8  PROT 7.7 6.9 6.7  ALBUMIN 4.3 3.8 3.5    Recent Labs Lab 08/04/14 2121 08/04/14 2142 08/05/14 1633  LIPASE 44 44 46   No results for input(s): AMMONIA in the last 168 hours. CBC:  Recent Labs Lab 08/04/14 2330 08/04/14 2340 08/05/14 1633  WBC 7.5  --  5.4  NEUTROABS 6.1  --  4.2  HGB 13.2 13.9 12.7*  HCT 40.2 41.0 37.9*  MCV 98.3  --  96.9  PLT 157  --  145*   Cardiac Enzymes: No results for input(s): CKTOTAL, CKMB, CKMBINDEX, TROPONINI in the last 168 hours.  BNP (last 3 results) No results for input(s): PROBNP in the last 8760 hours. CBG:  Recent Labs Lab 08/04/14 2057  GLUCAP 132*    Radiological Exams on Admission: Ct Angio Chest Pe W/cm &/or Wo Cm  08/05/2014   CLINICAL DATA:  Left chest pain. Epigastric pain. Nausea and vomiting for 1 day. Diarrhea for 1 month.  EXAM: CT ANGIOGRAPHY CHEST  CT ABDOMEN AND PELVIS WITH CONTRAST  TECHNIQUE: Multidetector CT imaging of the chest was performed using the standard protocol during bolus administration of intravenous contrast. Multiplanar CT image reconstructions and MIPs were obtained to evaluate the vascular anatomy. Multidetector CT imaging of the abdomen and pelvis was performed using the standard  protocol during bolus administration of intravenous contrast.  CONTRAST:  171mL OMNIPAQUE IOHEXOL 350 MG/ML SOLN  COMPARISON:  Chest radiograph 1 day prior.  FINDINGS: CTA CHEST FINDINGS  There are no filling defects within the pulmonary arteries to suggest pulmonary embolus.  The thoracic aorta is normal in caliber with mild atherosclerosis. No aneurysm or evidence of dissection. The heart is mildly enlarged. There are coronary artery calcifications. No  mediastinal or hilar adenopathy. No pleural or pericardial effusion.  Scattered atelectasis in the lungs. There is peripheral septal thickening. Mild hypoventilatory changes dependently. No consolidation to suggest pneumonia. Trachea is midline.  Degenerative change throughout the thoracic spine. No acute or suspicious osseous abnormality  CT ABDOMEN and PELVIS FINDINGS  Clips in the gallbladder fossa from cholecystectomy. The liver, spleen, pancreas, and adrenal glands are normal. There is a 2 cm cyst in the lower right kidney. No hydronephrosis. No renal stones. There is mild nonspecific perinephric stranding.  The stomach is distended with ingested contents. There is a duodenal diverticulum arising from the third portion of the duodenum. There are no dilated or thickened bowel loops. Small volume of colonic stool. There is diverticulosis of the descending and sigmoid colon without diverticulitis. There is fluid distending the distal sigmoid colon and rectum. No free air, free fluid, or intra-abdominal fluid collection. The appendix is normal.  The abdominal aorta is normal in caliber with moderate to dense atherosclerosis. No aneurysm. No retroperitoneal adenopathy. There is minimal haziness in the central mesenteric. No mesenteric adenopathy.  Within the pelvis the urinary bladder is physiologically distended. Prostate gland appears prominent measuring 5.6 cm in transverse dimension. There is fat in the right inguinal canal. No pelvic free fluid. No pelvic  adenopathy.  There is multilevel degenerative change throughout the lower thoracic and lumbar spine. No acute or suspicious osseous abnormality.  Review of the MIP images confirms the above findings.  IMPRESSION: 1. No pulmonary embolus. 2. Cardiomegaly. There is smooth septal thickening that may reflect early pulmonary edema. 3. Mild mesenteric haziness in the central abdomen suggesting mild panniculitis. 4. Fluid distending the distal colon and rectum, can be seen in the setting of diarrhea/mild colitis. 5. Incidental findings of colonic diverticulosis without diverticulitis, duodenal diverticulum, and right renal cyst. 6. Atherosclerosis of the thoracoabdominal aorta without aneurysm.   Electronically Signed   By: Jeb Levering M.D.   On: 08/05/2014 04:36   Dg Chest Port 1 View  08/04/2014   CLINICAL DATA:  Epigastric abdominal pain and vomiting for 24 hours.  EXAM: PORTABLE CHEST - 1 VIEW  COMPARISON:  Chest radiograph 12/07/2011  FINDINGS: Lung volumes are low leading to crowding of bronchovascular structures. Cardiomegaly is grossly unchanged allowing for differences in technique. No pulmonary edema, large pleural effusion, consolidation or pneumothorax. No acute osseous abnormalities are seen.  IMPRESSION: Hypoventilatory chest.  Grossly stable cardiomegaly.   Electronically Signed   By: Jeb Levering M.D.   On: 08/04/2014 21:39   Ct Angio Abd/pel W/ And/or W/o  08/05/2014   CLINICAL DATA:  Left chest pain. Epigastric pain. Nausea and vomiting for 1 day. Diarrhea for 1 month.  EXAM: CT ANGIOGRAPHY CHEST  CT ABDOMEN AND PELVIS WITH CONTRAST  TECHNIQUE: Multidetector CT imaging of the chest was performed using the standard protocol during bolus administration of intravenous contrast. Multiplanar CT image reconstructions and MIPs were obtained to evaluate the vascular anatomy. Multidetector CT imaging of the abdomen and pelvis was performed using the standard protocol during bolus administration of  intravenous contrast.  CONTRAST:  165mL OMNIPAQUE IOHEXOL 350 MG/ML SOLN  COMPARISON:  Chest radiograph 1 day prior.  FINDINGS: CTA CHEST FINDINGS  There are no filling defects within the pulmonary arteries to suggest pulmonary embolus.  The thoracic aorta is normal in caliber with mild atherosclerosis. No aneurysm or evidence of dissection. The heart is mildly enlarged. There are coronary artery calcifications. No mediastinal or hilar adenopathy. No pleural or pericardial  effusion.  Scattered atelectasis in the lungs. There is peripheral septal thickening. Mild hypoventilatory changes dependently. No consolidation to suggest pneumonia. Trachea is midline.  Degenerative change throughout the thoracic spine. No acute or suspicious osseous abnormality  CT ABDOMEN and PELVIS FINDINGS  Clips in the gallbladder fossa from cholecystectomy. The liver, spleen, pancreas, and adrenal glands are normal. There is a 2 cm cyst in the lower right kidney. No hydronephrosis. No renal stones. There is mild nonspecific perinephric stranding.  The stomach is distended with ingested contents. There is a duodenal diverticulum arising from the third portion of the duodenum. There are no dilated or thickened bowel loops. Small volume of colonic stool. There is diverticulosis of the descending and sigmoid colon without diverticulitis. There is fluid distending the distal sigmoid colon and rectum. No free air, free fluid, or intra-abdominal fluid collection. The appendix is normal.  The abdominal aorta is normal in caliber with moderate to dense atherosclerosis. No aneurysm. No retroperitoneal adenopathy. There is minimal haziness in the central mesenteric. No mesenteric adenopathy.  Within the pelvis the urinary bladder is physiologically distended. Prostate gland appears prominent measuring 5.6 cm in transverse dimension. There is fat in the right inguinal canal. No pelvic free fluid. No pelvic adenopathy.  There is multilevel degenerative  change throughout the lower thoracic and lumbar spine. No acute or suspicious osseous abnormality.  Review of the MIP images confirms the above findings.  IMPRESSION: 1. No pulmonary embolus. 2. Cardiomegaly. There is smooth septal thickening that may reflect early pulmonary edema. 3. Mild mesenteric haziness in the central abdomen suggesting mild panniculitis. 4. Fluid distending the distal colon and rectum, can be seen in the setting of diarrhea/mild colitis. 5. Incidental findings of colonic diverticulosis without diverticulitis, duodenal diverticulum, and right renal cyst. 6. Atherosclerosis of the thoracoabdominal aorta without aneurysm.   Electronically Signed   By: Jeb Levering M.D.   On: 08/05/2014 04:36    EKG: Independently reviewed.  Assessment/Plan Active Problems:   Vomiting   Gastroenteritis presumed infectious   1. Gastroenteritis - CT scan yesterday suggestive of infectious gastroenteritis. 1. Phenergan for nausea 2. IVF 3. Hold chlorthalidone 4. Clear liquids, advance diet as tolerated 2. HTN - 1. Hold chlorthalidone 2. Continue atenolol 3. Add PRN if needed    Code Status: Full  Family Communication: No family in room Disposition Plan: Admit to obs   Time spent: 50 min  GARDNER, JARED M. [facility] Pager [phone]  If 7AM-7PM, please contact the day team taking care of the patient [email or web address] Password [facility] 08/05/2014, 8:19 PM

## 2014-08-06 ENCOUNTER — Observation Stay (HOSPITAL_COMMUNITY): Payer: Medicare Other

## 2014-08-06 DIAGNOSIS — R197 Diarrhea, unspecified: Secondary | ICD-10-CM | POA: Diagnosis not present

## 2014-08-06 DIAGNOSIS — A09 Infectious gastroenteritis and colitis, unspecified: Secondary | ICD-10-CM | POA: Diagnosis not present

## 2014-08-06 DIAGNOSIS — R1310 Dysphagia, unspecified: Secondary | ICD-10-CM | POA: Diagnosis not present

## 2014-08-06 DIAGNOSIS — R1319 Other dysphagia: Secondary | ICD-10-CM

## 2014-08-06 LAB — GLUCOSE, CAPILLARY
Glucose-Capillary: 101 mg/dL — ABNORMAL HIGH (ref 70–99)
Glucose-Capillary: 113 mg/dL — ABNORMAL HIGH (ref 70–99)
Glucose-Capillary: 92 mg/dL (ref 70–99)

## 2014-08-06 LAB — CBC
HCT: 37.1 % — ABNORMAL LOW (ref 39.0–52.0)
Hemoglobin: 12.3 g/dL — ABNORMAL LOW (ref 13.0–17.0)
MCH: 33 pg (ref 26.0–34.0)
MCHC: 33.2 g/dL (ref 30.0–36.0)
MCV: 99.5 fL (ref 78.0–100.0)
Platelets: 158 10*3/uL (ref 150–400)
RBC: 3.73 MIL/uL — ABNORMAL LOW (ref 4.22–5.81)
RDW: 13.6 % (ref 11.5–15.5)
WBC: 7.3 10*3/uL (ref 4.0–10.5)

## 2014-08-06 LAB — BASIC METABOLIC PANEL
Anion gap: 6 (ref 5–15)
BUN: 12 mg/dL (ref 6–23)
CO2: 34 mmol/L — ABNORMAL HIGH (ref 19–32)
Calcium: 8.5 mg/dL (ref 8.4–10.5)
Chloride: 99 mmol/L (ref 96–112)
Creatinine, Ser: 0.78 mg/dL (ref 0.50–1.35)
GFR calc Af Amer: >90 mL/min (ref >90)
GFR calc non Af Amer: 87 mL/min — ABNORMAL LOW (ref >90)
Glucose, Bld: 128 mg/dL — ABNORMAL HIGH (ref 70–99)
Potassium: 3 mmol/L — ABNORMAL LOW (ref 3.5–5.1)
Sodium: 139 mmol/L (ref 135–145)

## 2014-08-06 MED ORDER — INSULIN ASPART 100 UNIT/ML ~~LOC~~ SOLN: 0.0000 [IU] | Freq: Three times a day (TID) | SUBCUTANEOUS | Status: DC

## 2014-08-06 MED ORDER — POTASSIUM CHLORIDE CRYS ER 20 MEQ PO TBCR
40.0000 meq | EXTENDED_RELEASE_TABLET | Freq: Once | ORAL | Status: AC
  Administered 2014-08-06: 40 meq via ORAL
  Filled 2014-08-06: qty 2

## 2014-08-06 NOTE — Progress Notes (Signed)
[facility] PROGRESS NOTE  [name]:[id number] DOB: [birth year] DOA: 08/05/2014 PCP: No PCP Per Patient   Assessment/Plan: 1. Nausea/VOmiting/Diarrhea -likely viral gastroenteritis -resolving, supportive care, IVF, advance diet  2. Intermittent Dysphagia -CT unremarkable, check Ba swallow  3. DM -hold Oral meds, SSI  4. HTN -stable, continue BB, hold diuretic  DVt proph: Hep SQ  Code Status: Full COde Family Communication: none at bedside Disposition Plan: home tomorrow if stable  HPI/Subjective: Feels better, no further vomiting since yest and diarrhea last episode at 1am  Objective: Filed Vitals:   08/06/14 0500  BP: 137/80  Pulse: 75  Temp: 97.3 F (36.3 C)  Resp: 18    Intake/Output Summary (Last 24 hours) at 08/06/14 1036 Last data filed at 08/06/14 0700  Gross per 24 hour  Intake 738.75 ml  Output    100 ml  Net 638.75 ml   Filed Weights   08/05/14 2034  Weight: 92.987 kg (205 lb)    Exam:   General:  AAOx3  Cardiovascular: S1S2/RRR  Respiratory: CTAB  Abdomen: soft, NT, BS present  Musculoskeletal: no edema c/c  Data Reviewed: Basic Metabolic Panel:  Recent Labs Lab 08/04/14 2121 08/04/14 2340 08/05/14 1633 08/06/14 0415  NA 138 139 142 139  K 3.0* 3.2* 3.2* 3.0*  CL 96 94* 101 99  CO2 31  --  29 34*  GLUCOSE 132* 189* 153* 128*  BUN 15 16 14 12   CREATININE 0.68 0.70 0.81 0.78  CALCIUM 9.1  --  8.5 8.5   Liver Function Tests:  Recent Labs Lab 08/04/14 2121 08/04/14 2142 08/05/14 1633  AST 19 14 17   ALT 15 15 14   ALKPHOS 59 55 51  BILITOT 1.0 0.8 0.8  PROT 7.7 6.9 6.7  ALBUMIN 4.3 3.8 3.5    Recent Labs Lab 08/04/14 2121 08/04/14 2142 08/05/14 1633  LIPASE 44 44 46   No results for input(s): AMMONIA in the last 168 hours. CBC:  Recent Labs Lab 08/04/14 2330 08/04/14 2340 08/05/14 1633 08/06/14 0415  WBC 7.5  --  5.4 7.3  NEUTROABS 6.1  --  4.2  --   HGB 13.2 13.9 12.7* 12.3*  HCT 40.2  41.0 37.9* 37.1*  MCV 98.3  --  96.9 99.5  PLT 157  --  145* 158   Cardiac Enzymes: No results for input(s): CKTOTAL, CKMB, CKMBINDEX, TROPONINI in the last 168 hours. BNP (last 3 results) No results for input(s): BNP in the last 8760 hours.  ProBNP (last 3 results) No results for input(s): PROBNP in the last 8760 hours.  CBG:  Recent Labs Lab 08/04/14 2057  GLUCAP 132*    No results found for this or any previous visit (from the past 240 hour(s)).   Studies: Ct Angio Chest Pe W/cm &/or Wo Cm  08/05/2014   CLINICAL DATA:  Left chest pain. Epigastric pain. Nausea and vomiting for 1 day. Diarrhea for 1 month.  EXAM: CT ANGIOGRAPHY CHEST  CT ABDOMEN AND PELVIS WITH CONTRAST  TECHNIQUE: Multidetector CT imaging of the chest was performed using the standard protocol during bolus administration of intravenous contrast. Multiplanar CT image reconstructions and MIPs were obtained to evaluate the vascular anatomy. Multidetector CT imaging of the abdomen and pelvis was performed using the standard protocol during bolus administration of intravenous contrast.  CONTRAST:  127mL OMNIPAQUE IOHEXOL 350 MG/ML SOLN  COMPARISON:  Chest radiograph 1 day prior.  FINDINGS: CTA CHEST FINDINGS  There are no filling defects within the pulmonary arteries  to suggest pulmonary embolus.  The thoracic aorta is normal in caliber with mild atherosclerosis. No aneurysm or evidence of dissection. The heart is mildly enlarged. There are coronary artery calcifications. No mediastinal or hilar adenopathy. No pleural or pericardial effusion.  Scattered atelectasis in the lungs. There is peripheral septal thickening. Mild hypoventilatory changes dependently. No consolidation to suggest pneumonia. Trachea is midline.  Degenerative change throughout the thoracic spine. No acute or suspicious osseous abnormality  CT ABDOMEN and PELVIS FINDINGS  Clips in the gallbladder fossa from cholecystectomy. The liver, spleen, pancreas, and  adrenal glands are normal. There is a 2 cm cyst in the lower right kidney. No hydronephrosis. No renal stones. There is mild nonspecific perinephric stranding.  The stomach is distended with ingested contents. There is a duodenal diverticulum arising from the third portion of the duodenum. There are no dilated or thickened bowel loops. Small volume of colonic stool. There is diverticulosis of the descending and sigmoid colon without diverticulitis. There is fluid distending the distal sigmoid colon and rectum. No free air, free fluid, or intra-abdominal fluid collection. The appendix is normal.  The abdominal aorta is normal in caliber with moderate to dense atherosclerosis. No aneurysm. No retroperitoneal adenopathy. There is minimal haziness in the central mesenteric. No mesenteric adenopathy.  Within the pelvis the urinary bladder is physiologically distended. Prostate gland appears prominent measuring 5.6 cm in transverse dimension. There is fat in the right inguinal canal. No pelvic free fluid. No pelvic adenopathy.  There is multilevel degenerative change throughout the lower thoracic and lumbar spine. No acute or suspicious osseous abnormality.  Review of the MIP images confirms the above findings.  IMPRESSION: 1. No pulmonary embolus. 2. Cardiomegaly. There is smooth septal thickening that may reflect early pulmonary edema. 3. Mild mesenteric haziness in the central abdomen suggesting mild panniculitis. 4. Fluid distending the distal colon and rectum, can be seen in the setting of diarrhea/mild colitis. 5. Incidental findings of colonic diverticulosis without diverticulitis, duodenal diverticulum, and right renal cyst. 6. Atherosclerosis of the thoracoabdominal aorta without aneurysm.   Electronically Signed   By: Jeb Levering M.D.   On: 08/05/2014 04:36   Dg Chest Port 1 View  08/04/2014   CLINICAL DATA:  Epigastric abdominal pain and vomiting for 24 hours.  EXAM: PORTABLE CHEST - 1 VIEW  COMPARISON:   Chest radiograph 12/07/2011  FINDINGS: Lung volumes are low leading to crowding of bronchovascular structures. Cardiomegaly is grossly unchanged allowing for differences in technique. No pulmonary edema, large pleural effusion, consolidation or pneumothorax. No acute osseous abnormalities are seen.  IMPRESSION: Hypoventilatory chest.  Grossly stable cardiomegaly.   Electronically Signed   By: Jeb Levering M.D.   On: 08/04/2014 21:39   Ct Angio Abd/pel W/ And/or W/o  08/05/2014   CLINICAL DATA:  Left chest pain. Epigastric pain. Nausea and vomiting for 1 day. Diarrhea for 1 month.  EXAM: CT ANGIOGRAPHY CHEST  CT ABDOMEN AND PELVIS WITH CONTRAST  TECHNIQUE: Multidetector CT imaging of the chest was performed using the standard protocol during bolus administration of intravenous contrast. Multiplanar CT image reconstructions and MIPs were obtained to evaluate the vascular anatomy. Multidetector CT imaging of the abdomen and pelvis was performed using the standard protocol during bolus administration of intravenous contrast.  CONTRAST:  133mL OMNIPAQUE IOHEXOL 350 MG/ML SOLN  COMPARISON:  Chest radiograph 1 day prior.  FINDINGS: CTA CHEST FINDINGS  There are no filling defects within the pulmonary arteries to suggest pulmonary embolus.  The thoracic aorta  is normal in caliber with mild atherosclerosis. No aneurysm or evidence of dissection. The heart is mildly enlarged. There are coronary artery calcifications. No mediastinal or hilar adenopathy. No pleural or pericardial effusion.  Scattered atelectasis in the lungs. There is peripheral septal thickening. Mild hypoventilatory changes dependently. No consolidation to suggest pneumonia. Trachea is midline.  Degenerative change throughout the thoracic spine. No acute or suspicious osseous abnormality  CT ABDOMEN and PELVIS FINDINGS  Clips in the gallbladder fossa from cholecystectomy. The liver, spleen, pancreas, and adrenal glands are normal. There is a 2 cm cyst  in the lower right kidney. No hydronephrosis. No renal stones. There is mild nonspecific perinephric stranding.  The stomach is distended with ingested contents. There is a duodenal diverticulum arising from the third portion of the duodenum. There are no dilated or thickened bowel loops. Small volume of colonic stool. There is diverticulosis of the descending and sigmoid colon without diverticulitis. There is fluid distending the distal sigmoid colon and rectum. No free air, free fluid, or intra-abdominal fluid collection. The appendix is normal.  The abdominal aorta is normal in caliber with moderate to dense atherosclerosis. No aneurysm. No retroperitoneal adenopathy. There is minimal haziness in the central mesenteric. No mesenteric adenopathy.  Within the pelvis the urinary bladder is physiologically distended. Prostate gland appears prominent measuring 5.6 cm in transverse dimension. There is fat in the right inguinal canal. No pelvic free fluid. No pelvic adenopathy.  There is multilevel degenerative change throughout the lower thoracic and lumbar spine. No acute or suspicious osseous abnormality.  Review of the MIP images confirms the above findings.  IMPRESSION: 1. No pulmonary embolus. 2. Cardiomegaly. There is smooth septal thickening that may reflect early pulmonary edema. 3. Mild mesenteric haziness in the central abdomen suggesting mild panniculitis. 4. Fluid distending the distal colon and rectum, can be seen in the setting of diarrhea/mild colitis. 5. Incidental findings of colonic diverticulosis without diverticulitis, duodenal diverticulum, and right renal cyst. 6. Atherosclerosis of the thoracoabdominal aorta without aneurysm.   Electronically Signed   By: Jeb Levering M.D.   On: 08/05/2014 04:36    Scheduled Meds: . aspirin  81 mg Oral Daily  . atenolol  50 mg Oral Daily  . heparin  5,000 Units Subcutaneous 3 times per day  . insulin aspart  0-9 Units Subcutaneous TID WC  . loratadine   10 mg Oral Daily  . potassium chloride  40 mEq Oral Once   Continuous Infusions: . sodium chloride 75 mL/hr at 08/05/14 2109   Antibiotics Given (last 72 hours)    None      Active Problems:   Vomiting   Gastroenteritis presumed infectious    Time spent: 70min    [name]  [facility] Pager [phone]. If 7PM-7AM, please contact night-coverage at [email or web address], password [email or web address] 08/06/2014, 10:36 AM

## 2014-08-06 NOTE — Progress Notes (Signed)
UR completed 

## 2014-08-07 DIAGNOSIS — E1165 Type 2 diabetes mellitus with hyperglycemia: Secondary | ICD-10-CM | POA: Diagnosis not present

## 2014-08-07 DIAGNOSIS — I10 Essential (primary) hypertension: Secondary | ICD-10-CM | POA: Diagnosis not present

## 2014-08-07 DIAGNOSIS — A09 Infectious gastroenteritis and colitis, unspecified: Secondary | ICD-10-CM | POA: Diagnosis not present

## 2014-08-07 DIAGNOSIS — R197 Diarrhea, unspecified: Secondary | ICD-10-CM | POA: Diagnosis not present

## 2014-08-07 LAB — BASIC METABOLIC PANEL
Anion gap: 9 (ref 5–15)
BUN: 8 mg/dL (ref 6–23)
CO2: 29 mmol/L (ref 19–32)
Calcium: 8.4 mg/dL (ref 8.4–10.5)
Chloride: 101 mmol/L (ref 96–112)
Creatinine, Ser: 0.73 mg/dL (ref 0.50–1.35)
GFR calc Af Amer: >90 mL/min (ref >90)
GFR calc non Af Amer: 89 mL/min — ABNORMAL LOW (ref >90)
Glucose, Bld: 104 mg/dL — ABNORMAL HIGH (ref 70–99)
Potassium: 3.4 mmol/L — ABNORMAL LOW (ref 3.5–5.1)
Sodium: 139 mmol/L (ref 135–145)

## 2014-08-07 LAB — GLUCOSE, CAPILLARY: Glucose-Capillary: 106 mg/dL — ABNORMAL HIGH (ref 70–99)

## 2014-08-07 NOTE — Progress Notes (Signed)
Utilization Review completed.  

## 2014-08-08 NOTE — Discharge Summary (Addendum)
Physician Discharge Summary  [name]:[id number] DOB: [birth year] DOA: 08/05/2014  PCP: [name]  Admit date: 08/05/2014 Discharge date: 08/07/2014  Time spent: 74minutes  Recommendations for Outpatient Follow-up:  Needs EGD soon to evaluate narrowing of distal esophagus-he wanted to get this done as outpatient and I couldn't refer him since he was admitted over the weekend  Discharge Diagnoses:    Viral Gastroenteritis   Dysphagia   Essential hypertension   Diabetes mellitus type II, uncontrolled   Vomiting   Dysphagia   Discharge Condition: stable  Diet recommendation: diabetic  Filed Weights   08/05/14 2034  Weight: 92.987 kg (205 lb)    History of present illness:  [name] is a 74 y.o. male who presents to ED with c/o N/V/D for about 4 days. Had diarrhea initially but this resolved, had persistent nausea and vomiting upon admission  Hospital Course:  1. Nausea/VOmiting/Diarrhea -likely viral gastroenteritis -resolved with supportive care, IVF, advanced diet and tolerating regular diet at the time of discharge  2. Intermittent Dysphagia -CT unremarkable,  -Ba swallow completed with narrowing of distal esophagus: Needs an EGD and this was emphasized to the patient, he is adamant about being discharged on Saturday due to family commitments and will get this done through his PCPs office as outpatient.  3. DM -stable, continue glipizide and metformin  4. HTN -stable, continue BB, hold diuretic  Procedures:  Esophagram:   IMPRESSION: 1. No mucosal irregularity or mass in the esophagus. 2. Narrowing of the distal esophagus impeded the 13 mm barium. Recommend outpatient upper endoscopy to evaluate distal narrowing of the esophagus  Discharge Exam: Filed Vitals:   08/07/14 0959  BP:   Pulse: 76  Temp:   Resp:     General: AAOx3 Cardiovascular: S1S2/RRR Respiratory: CTAB  Discharge Instructions   Discharge Instructions    Diet -  low sodium heart healthy    Complete by:  As directed      Diet Carb Modified    Complete by:  As directed      Increase activity slowly    Complete by:  As directed           Discharge Medication List as of 08/07/2014 10:46 AM    CONTINUE these medications which have NOT CHANGED   Details  aspirin 81 MG tablet Take 81 mg by mouth as needed for pain (pain). , Until Discontinued, Historical Med    atenolol-chlorthalidone (TENORETIC) 50-25 MG per tablet TAKE ONE TABLET BY MOUTH ONE TIME DAILY , Normal    Ginger, Zingiber officinalis, (GINGER PO) Take 1 capsule by mouth daily., Until Discontinued, Historical Med    glipiZIDE (GLUCOTROL) 5 MG tablet take 1 tablet by mouth prior to breakfast, Normal    guaiFENesin (ROBITUSSIN) 100 MG/5ML SOLN Take 5 mLs by mouth every 4 (four) hours as needed for cough or to loosen phlegm., Until Discontinued, Historical Med    halobetasol (ULTRAVATE) 0.05 % cream Apply to the affected area twice daily , Normal    loratadine (CLARITIN) 10 MG tablet Take 10 mg by mouth daily., Until Discontinued, Historical Med    metFORMIN (GLUCOPHAGE) 1000 MG tablet TAKE ONE TABLET BY MOUTH TWICE DAILY , Normal    potassium chloride SA (K-DUR,KLOR-CON) 20 MEQ tablet TAKE ONE TABLET BY MOUTH THREE TIMES DAILY , Normal      STOP taking these medications     DM-Doxylamine-Acetaminophen (NYQUIL COLD & FLU PO)      Pseudoeph-Bromphen-DM (COLD & COUGH DM  PO)        No Known Allergies Follow-up Information    Follow up with Tyrone ALLEN, MD In 1 week.   Specialty:  Family Medicine   Contact information:   [place] [address] [phone]       Follow up with [facility].   Why:  in 2-3weeks for Upper Endoscopy-VERY IMPROTANT       The results of significant diagnostics from this hospitalization (including imaging, microbiology, ancillary and laboratory) are listed below for reference.    Significant Diagnostic Studies: Ct  Angio Chest Pe W/cm &/or Wo Cm  08/05/2014   CLINICAL DATA:  Left chest pain. Epigastric pain. Nausea and vomiting for 1 day. Diarrhea for 1 month.  EXAM: CT ANGIOGRAPHY CHEST  CT ABDOMEN AND PELVIS WITH CONTRAST  TECHNIQUE: Multidetector CT imaging of the chest was performed using the standard protocol during bolus administration of intravenous contrast. Multiplanar CT image reconstructions and MIPs were obtained to evaluate the vascular anatomy. Multidetector CT imaging of the abdomen and pelvis was performed using the standard protocol during bolus administration of intravenous contrast.  CONTRAST:  116mL OMNIPAQUE IOHEXOL 350 MG/ML SOLN  COMPARISON:  Chest radiograph 1 day prior.  FINDINGS: CTA CHEST FINDINGS  There are no filling defects within the pulmonary arteries to suggest pulmonary embolus.  The thoracic aorta is normal in caliber with mild atherosclerosis. No aneurysm or evidence of dissection. The heart is mildly enlarged. There are coronary artery calcifications. No mediastinal or hilar adenopathy. No pleural or pericardial effusion.  Scattered atelectasis in the lungs. There is peripheral septal thickening. Mild hypoventilatory changes dependently. No consolidation to suggest pneumonia. Trachea is midline.  Degenerative change throughout the thoracic spine. No acute or suspicious osseous abnormality  CT ABDOMEN and PELVIS FINDINGS  Clips in the gallbladder fossa from cholecystectomy. The liver, spleen, pancreas, and adrenal glands are normal. There is a 2 cm cyst in the lower right kidney. No hydronephrosis. No renal stones. There is mild nonspecific perinephric stranding.  The stomach is distended with ingested contents. There is a duodenal diverticulum arising from the third portion of the duodenum. There are no dilated or thickened bowel loops. Small volume of colonic stool. There is diverticulosis of the descending and sigmoid colon without diverticulitis. There is fluid distending the distal  sigmoid colon and rectum. No free air, free fluid, or intra-abdominal fluid collection. The appendix is normal.  The abdominal aorta is normal in caliber with moderate to dense atherosclerosis. No aneurysm. No retroperitoneal adenopathy. There is minimal haziness in the central mesenteric. No mesenteric adenopathy.  Within the pelvis the urinary bladder is physiologically distended. Prostate gland appears prominent measuring 5.6 cm in transverse dimension. There is fat in the right inguinal canal. No pelvic free fluid. No pelvic adenopathy.  There is multilevel degenerative change throughout the lower thoracic and lumbar spine. No acute or suspicious osseous abnormality.  Review of the MIP images confirms the above findings.  IMPRESSION: 1. No pulmonary embolus. 2. Cardiomegaly. There is smooth septal thickening that may reflect early pulmonary edema. 3. Mild mesenteric haziness in the central abdomen suggesting mild panniculitis. 4. Fluid distending the distal colon and rectum, can be seen in the setting of diarrhea/mild colitis. 5. Incidental findings of colonic diverticulosis without diverticulitis, duodenal diverticulum, and right renal cyst. 6. Atherosclerosis of the thoracoabdominal aorta without aneurysm.   Electronically Signed   By: Jeb Levering M.D.   On: 08/05/2014 04:36   Dg Esophagus  08/06/2014  CLINICAL DATA:  Difficulty swallowing pills. Mid sternal in the epic gastric pain.  EXAM: ESOPHOGRAM/BARIUM SWALLOW  TECHNIQUE: Combined double contrast and single contrast examination performed using effervescent crystals, thick barium liquid, and thin barium liquid.  FLUOROSCOPY TIME:  Not recorded  COMPARISON:  CT thorax 08/05/18 16  FINDINGS: Due to a computer hard disc mal function, the fluoroscopic images were non recoverable.  No mucosal irregularity within the thoracic esophagus or distal esophagus. Normal esophageal dysmotility. There is mild narrowing of the distal esophagus. A 13 mm barium  tablet failed to pass the GE junction over a 5 min interval while drinking water and thick barium.  IMPRESSION: 1. No mucosal irregularity or mass in the esophagus. 2. Narrowing of the distal esophagus impeded the 13 mm barium. Recommend outpatient upper endoscopy to evaluate distal narrowing of the esophagus. These results will be called to the ordering clinician or representative by the Radiologist Assistant, and communication documented in the PACS or zVision Dashboard.   Electronically Signed   By: Suzy Bouchard M.D.   On: 08/06/2014 17:21   Dg Chest Port 1 View  08/04/2014   CLINICAL DATA:  Epigastric abdominal pain and vomiting for 24 hours.  EXAM: PORTABLE CHEST - 1 VIEW  COMPARISON:  Chest radiograph 12/07/2011  FINDINGS: Lung volumes are low leading to crowding of bronchovascular structures. Cardiomegaly is grossly unchanged allowing for differences in technique. No pulmonary edema, large pleural effusion, consolidation or pneumothorax. No acute osseous abnormalities are seen.  IMPRESSION: Hypoventilatory chest.  Grossly stable cardiomegaly.   Electronically Signed   By: Jeb Levering M.D.   On: 08/04/2014 21:39   Ct Angio Abd/pel W/ And/or W/o  08/05/2014   CLINICAL DATA:  Left chest pain. Epigastric pain. Nausea and vomiting for 1 day. Diarrhea for 1 month.  EXAM: CT ANGIOGRAPHY CHEST  CT ABDOMEN AND PELVIS WITH CONTRAST  TECHNIQUE: Multidetector CT imaging of the chest was performed using the standard protocol during bolus administration of intravenous contrast. Multiplanar CT image reconstructions and MIPs were obtained to evaluate the vascular anatomy. Multidetector CT imaging of the abdomen and pelvis was performed using the standard protocol during bolus administration of intravenous contrast.  CONTRAST:  151mL OMNIPAQUE IOHEXOL 350 MG/ML SOLN  COMPARISON:  Chest radiograph 1 day prior.  FINDINGS: CTA CHEST FINDINGS  There are no filling defects within the pulmonary arteries to suggest  pulmonary embolus.  The thoracic aorta is normal in caliber with mild atherosclerosis. No aneurysm or evidence of dissection. The heart is mildly enlarged. There are coronary artery calcifications. No mediastinal or hilar adenopathy. No pleural or pericardial effusion.  Scattered atelectasis in the lungs. There is peripheral septal thickening. Mild hypoventilatory changes dependently. No consolidation to suggest pneumonia. Trachea is midline.  Degenerative change throughout the thoracic spine. No acute or suspicious osseous abnormality  CT ABDOMEN and PELVIS FINDINGS  Clips in the gallbladder fossa from cholecystectomy. The liver, spleen, pancreas, and adrenal glands are normal. There is a 2 cm cyst in the lower right kidney. No hydronephrosis. No renal stones. There is mild nonspecific perinephric stranding.  The stomach is distended with ingested contents. There is a duodenal diverticulum arising from the third portion of the duodenum. There are no dilated or thickened bowel loops. Small volume of colonic stool. There is diverticulosis of the descending and sigmoid colon without diverticulitis. There is fluid distending the distal sigmoid colon and rectum. No free air, free fluid, or intra-abdominal fluid collection. The appendix is normal.  The abdominal  aorta is normal in caliber with moderate to dense atherosclerosis. No aneurysm. No retroperitoneal adenopathy. There is minimal haziness in the central mesenteric. No mesenteric adenopathy.  Within the pelvis the urinary bladder is physiologically distended. Prostate gland appears prominent measuring 5.6 cm in transverse dimension. There is fat in the right inguinal canal. No pelvic free fluid. No pelvic adenopathy.  There is multilevel degenerative change throughout the lower thoracic and lumbar spine. No acute or suspicious osseous abnormality.  Review of the MIP images confirms the above findings.  IMPRESSION: 1. No pulmonary embolus. 2. Cardiomegaly. There is  smooth septal thickening that may reflect early pulmonary edema. 3. Mild mesenteric haziness in the central abdomen suggesting mild panniculitis. 4. Fluid distending the distal colon and rectum, can be seen in the setting of diarrhea/mild colitis. 5. Incidental findings of colonic diverticulosis without diverticulitis, duodenal diverticulum, and right renal cyst. 6. Atherosclerosis of the thoracoabdominal aorta without aneurysm.   Electronically Signed   By: Jeb Levering M.D.   On: 08/05/2014 04:36    Microbiology: No results found for this or any previous visit (from the past 240 hour(s)).   Labs: Basic Metabolic Panel:  Recent Labs Lab 08/04/14 2121 08/04/14 2340 08/05/14 1633 08/06/14 0415 08/07/14 0547  NA 138 139 142 139 139  K 3.0* 3.2* 3.2* 3.0* 3.4*  CL 96 94* 101 99 101  CO2 31  --  29 34* 29  GLUCOSE 132* 189* 153* 128* 104*  BUN 15 16 14 12 8   CREATININE 0.68 0.70 0.81 0.78 0.73  CALCIUM 9.1  --  8.5 8.5 8.4   Liver Function Tests:  Recent Labs Lab 08/04/14 2121 08/04/14 2142 08/05/14 1633  AST 19 14 17   ALT 15 15 14   ALKPHOS 59 55 51  BILITOT 1.0 0.8 0.8  PROT 7.7 6.9 6.7  ALBUMIN 4.3 3.8 3.5    Recent Labs Lab 08/04/14 2121 08/04/14 2142 08/05/14 1633  LIPASE 44 44 46   No results for input(s): AMMONIA in the last 168 hours. CBC:  Recent Labs Lab 08/04/14 2330 08/04/14 2340 08/05/14 1633 08/06/14 0415  WBC 7.5  --  5.4 7.3  NEUTROABS 6.1  --  4.2  --   HGB 13.2 13.9 12.7* 12.3*  HCT 40.2 41.0 37.9* 37.1*  MCV 98.3  --  96.9 99.5  PLT 157  --  145* 158   Cardiac Enzymes: No results for input(s): CKTOTAL, CKMB, CKMBINDEX, TROPONINI in the last 168 hours. BNP: BNP (last 3 results) No results for input(s): BNP in the last 8760 hours.  ProBNP (last 3 results) No results for input(s): PROBNP in the last 8760 hours.  CBG:  Recent Labs Lab 08/04/14 2057 08/06/14 1203 08/06/14 1745 08/06/14 2140 08/07/14 0806  GLUCAP 132* 101*  113* 92 106*       Signed:  Shivani Barrantes  [facility] 08/08/2014, 3:42 PM

## 2014-08-09 ENCOUNTER — Telehealth: Payer: Self-pay | Admitting: Family Medicine

## 2014-08-09 DIAGNOSIS — K529 Noninfective gastroenteritis and colitis, unspecified: Secondary | ICD-10-CM

## 2014-08-09 DIAGNOSIS — R112 Nausea with vomiting, unspecified: Secondary | ICD-10-CM | POA: Insufficient documentation

## 2014-08-09 NOTE — Telephone Encounter (Signed)
Patient was discharged from the hospital and needs a referral to GI, please and thank you.

## 2014-08-09 NOTE — Telephone Encounter (Signed)
Gi referral placed  

## 2014-08-11 ENCOUNTER — Telehealth: Payer: Self-pay | Admitting: Gastroenterology

## 2014-08-11 NOTE — Telephone Encounter (Signed)
Left message with the spouse. Patient is unable to come to the phone right now. I will call back to get him an appointment.

## 2014-08-11 NOTE — Telephone Encounter (Signed)
Patient has referral placed in Epic by Dr. Sherren Mocha.  Pt was released from hospital and per AVS, states "needs endoscope in 2 to 3 weeks; VERY  IMPORTANT".

## 2014-08-12 NOTE — Telephone Encounter (Signed)
I called the pt to set up appt and he was unavailable, I spoke with his wife and she wanted to sch f/u appt for him, several dates were given but were not convenient for her so she was given 10/22/14.  He was put on the wait list and he will call back if condition worsens.

## 2014-08-31 ENCOUNTER — Ambulatory Visit: Payer: Medicare Other | Admitting: Medical

## 2014-09-14 ENCOUNTER — Other Ambulatory Visit (INDEPENDENT_AMBULATORY_CARE_PROVIDER_SITE_OTHER): Payer: Medicare Other

## 2014-09-14 DIAGNOSIS — I10 Essential (primary) hypertension: Secondary | ICD-10-CM

## 2014-09-14 DIAGNOSIS — N400 Enlarged prostate without lower urinary tract symptoms: Secondary | ICD-10-CM

## 2014-09-14 DIAGNOSIS — E119 Type 2 diabetes mellitus without complications: Secondary | ICD-10-CM | POA: Diagnosis not present

## 2014-09-14 DIAGNOSIS — R300 Dysuria: Secondary | ICD-10-CM | POA: Diagnosis not present

## 2014-09-14 LAB — CBC WITH DIFFERENTIAL/PLATELET
Basophils Absolute: 0 10*3/uL (ref 0.0–0.1)
Basophils Relative: 0.8 % (ref 0.0–3.0)
Eosinophils Absolute: 0.3 10*3/uL (ref 0.0–0.7)
Eosinophils Relative: 4.5 % (ref 0.0–5.0)
HCT: 39.1 % (ref 39.0–52.0)
Hemoglobin: 13.2 g/dL (ref 13.0–17.0)
Lymphocytes Relative: 20.6 % (ref 12.0–46.0)
Lymphs Abs: 1.3 10*3/uL (ref 0.7–4.0)
MCHC: 33.8 g/dL (ref 30.0–36.0)
MCV: 95.3 fl (ref 78.0–100.0)
Monocytes Absolute: 0.6 10*3/uL (ref 0.1–1.0)
Monocytes Relative: 9.6 % (ref 3.0–12.0)
Neutro Abs: 4.2 10*3/uL (ref 1.4–7.7)
Neutrophils Relative %: 64.5 % (ref 43.0–77.0)
Platelets: 217 10*3/uL (ref 150.0–400.0)
RBC: 4.11 Mil/uL — ABNORMAL LOW (ref 4.22–5.81)
RDW: 14.3 % (ref 11.5–15.5)
WBC: 6.4 10*3/uL (ref 4.0–10.5)

## 2014-09-14 LAB — POCT URINALYSIS DIPSTICK
Glucose, UA: NEGATIVE
Leukocytes, UA: NEGATIVE
Nitrite, UA: NEGATIVE
Spec Grav, UA: 1.02
Urobilinogen, UA: 1
pH, UA: 7

## 2014-09-14 LAB — HEPATIC FUNCTION PANEL
ALT: 13 U/L (ref 0–53)
AST: 14 U/L (ref 0–37)
Albumin: 4 g/dL (ref 3.5–5.2)
Alkaline Phosphatase: 60 U/L (ref 39–117)
Bilirubin, Direct: 0.2 mg/dL (ref 0.0–0.3)
Total Bilirubin: 0.6 mg/dL (ref 0.2–1.2)
Total Protein: 6.8 g/dL (ref 6.0–8.3)

## 2014-09-14 LAB — LIPID PANEL
Cholesterol: 136 mg/dL (ref 0–200)
HDL: 36.8 mg/dL — ABNORMAL LOW (ref >39.00)
LDL Cholesterol: 73 mg/dL (ref 0–99)
NonHDL: 99.2
Total CHOL/HDL Ratio: 4
Triglycerides: 129 mg/dL (ref 0.0–149.0)
VLDL: 25.8 mg/dL (ref 0.0–40.0)

## 2014-09-14 LAB — BASIC METABOLIC PANEL
BUN: 11 mg/dL (ref 6–23)
CO2: 30 mEq/L (ref 19–32)
Calcium: 9.3 mg/dL (ref 8.4–10.5)
Chloride: 102 mEq/L (ref 96–112)
Creatinine, Ser: 0.71 mg/dL (ref 0.40–1.50)
GFR: 114.98 mL/min (ref >60.00)
Glucose, Bld: 106 mg/dL — ABNORMAL HIGH (ref 70–99)
Potassium: 4 mEq/L (ref 3.5–5.1)
Sodium: 139 mEq/L (ref 135–145)

## 2014-09-14 LAB — HEMOGLOBIN A1C: Hgb A1c MFr Bld: 5.2 % (ref 4.6–6.5)

## 2014-09-14 LAB — MICROALBUMIN / CREATININE URINE RATIO
Creatinine,U: 197.1 mg/dL
Microalb Creat Ratio: 0.9 mg/g (ref 0.0–30.0)
Microalb, Ur: 1.7 mg/dL (ref 0.0–1.9)

## 2014-09-14 LAB — TSH: TSH: 2.39 u[IU]/mL (ref 0.35–4.50)

## 2014-09-14 NOTE — Addendum Note (Signed)
Addended by: Elmer Picker on: 09/14/2014 10:47 AM   Modules accepted: Orders

## 2014-09-16 DIAGNOSIS — M25561 Pain in right knee: Secondary | ICD-10-CM | POA: Diagnosis not present

## 2014-09-16 DIAGNOSIS — L97421 Non-pressure chronic ulcer of left heel and midfoot limited to breakdown of skin: Secondary | ICD-10-CM | POA: Diagnosis not present

## 2014-09-17 ENCOUNTER — Ambulatory Visit: Payer: Medicare Other | Admitting: Family Medicine

## 2014-09-21 ENCOUNTER — Ambulatory Visit (INDEPENDENT_AMBULATORY_CARE_PROVIDER_SITE_OTHER): Payer: Medicare Other | Admitting: Family Medicine

## 2014-09-21 ENCOUNTER — Encounter: Payer: Self-pay | Admitting: Family Medicine

## 2014-09-21 VITALS — BP 110/80 | Temp 97.7°F | Ht 68.5 in | Wt 221.0 lb

## 2014-09-21 DIAGNOSIS — E1165 Type 2 diabetes mellitus with hyperglycemia: Secondary | ICD-10-CM | POA: Diagnosis not present

## 2014-09-21 DIAGNOSIS — I10 Essential (primary) hypertension: Secondary | ICD-10-CM

## 2014-09-21 DIAGNOSIS — IMO0002 Reserved for concepts with insufficient information to code with codable children: Secondary | ICD-10-CM

## 2014-09-21 DIAGNOSIS — Z0000 Encounter for general adult medical examination without abnormal findings: Secondary | ICD-10-CM | POA: Diagnosis not present

## 2014-09-21 MED ORDER — METFORMIN HCL 1000 MG PO TABS: 1000.0000 mg | ORAL_TABLET | Freq: Two times a day (BID) | ORAL | Status: DC

## 2014-09-21 MED ORDER — POTASSIUM CHLORIDE CRYS ER 20 MEQ PO TBCR: EXTENDED_RELEASE_TABLET | ORAL | Status: DC

## 2014-09-21 MED ORDER — ATENOLOL-CHLORTHALIDONE 50-25 MG PO TABS: ORAL_TABLET | ORAL | Status: DC

## 2014-09-21 MED ORDER — HALOBETASOL PROPIONATE 0.05 % EX CREA: TOPICAL_CREAM | Freq: Two times a day (BID) | CUTANEOUS | Status: DC

## 2014-09-21 NOTE — Progress Notes (Signed)
Pre visit review using our clinic review tool, if applicable. No additional management support is needed unless otherwise documented below in the visit note. 

## 2014-09-21 NOTE — Progress Notes (Signed)
   Subjective:    Patient ID: [name], male    DOB: 1940/06/07, 74 y.o.   MRN: [id number]  HPI [name] is a 74 year old married male nonsmoker who comes in today for just in general physical examination because a history of hypertension diabetes type 2  He's on Tenoretic 50-25. He's intolerant of ace inhibitors. BP today 110/80. Advised to cut his medicine half  He takes metformin 1000 mg twice a day and Glucotrol 5 mg daily. Blood sugar is 90 do 100 A1c down to 5.2%.  He gets routine eye care, dental care, colonoscopy and GI 5 years ago normal. Prior to that he had some polyps  Cognitive function normal he walks on a daily basis home health safety reviewed no issues identified, he does have guns in the house sees a former police captain, he does have a healthcare power of attorney and living well  Vaccinations up-to-date   Review of Systems  Constitutional: Negative.   HENT: Negative.   Eyes: Negative.   Respiratory: Negative.   Cardiovascular: Negative.   Gastrointestinal: Negative.   Endocrine: Negative.   Genitourinary: Negative.   Musculoskeletal: Negative.   Skin: Negative.   Allergic/Immunologic: Negative.   Neurological: Negative.   Hematological: Negative.   Psychiatric/Behavioral: Negative.        Objective:   Physical Exam  Constitutional: He is oriented to person, place, and time. He appears well-developed and well-nourished.  Terrible posture he's been forward about 30  HENT:  Head: Normocephalic and atraumatic.  Right Ear: External ear normal.  Left Ear: External ear normal.  Nose: Nose normal.  Mouth/Throat: Oropharynx is clear and moist.  Eyes: Conjunctivae and EOM are normal. Pupils are equal, round, and reactive to light.  Neck: Normal range of motion. Neck supple. No JVD present. No tracheal deviation present. No thyromegaly present.  Cardiovascular: Normal rate, regular rhythm, normal heart sounds and intact distal pulses.  Exam reveals no gallop  and no friction rub.   No murmur heard. Pulmonary/Chest: Effort normal and breath sounds normal. No stridor. No respiratory distress. He has no wheezes. He has no rales. He exhibits no tenderness.  Abdominal: Soft. Bowel sounds are normal. He exhibits no distension and no mass. There is no tenderness. There is no rebound and no guarding.  Genitourinary: Rectum normal, prostate normal and penis normal. Guaiac negative stool. No penile tenderness.  Musculoskeletal: Normal range of motion. He exhibits no edema or tenderness.  Amputation second toe right foot multiple calluses followed by Dr. Sharol Given  Lymphadenopathy:    He has no cervical adenopathy.  Neurological: He is alert and oriented to person, place, and time. He has normal reflexes. No cranial nerve deficit. He exhibits normal muscle tone.  Skin: Skin is warm and dry. No rash noted. No erythema. No pallor.  Psychiatric: He has a normal mood and affect. His behavior is normal. Judgment and thought content normal.  Nursing note and vitals reviewed.         Assessment & Plan:  Hypertension,,, BP too low decrease medication do a half a tab daily  Hypokalemia,,,,,,, decrease potassium to 2 pills daily  Back pain,,,,,,,, consult with Dr. Sharol Given  Diabetes type 2 under excellent control A1c 5.2,,,,,,,,, stop the glipizide continue the metformin follow-up blood sugar in 6 months

## 2014-09-21 NOTE — Patient Instructions (Signed)
Tenoretic 50-25.......... only take a half a tablet in the morning  Potassium........ 2 tabs daily in the morning  Stop the glipizide  Continue the metformin........Marland Kitchen 1 tab before breakfast.......Marland Kitchen 1 tab before your evening male  Consult with Dr. Sharol Given about your back  Return in 6 months for follow-up of your blood sugar with Tobin Chad

## 2014-10-05 ENCOUNTER — Ambulatory Visit: Payer: PRIVATE HEALTH INSURANCE | Admitting: Family Medicine

## 2014-10-16 ENCOUNTER — Emergency Department (HOSPITAL_BASED_OUTPATIENT_CLINIC_OR_DEPARTMENT_OTHER)
Admission: EM | Admit: 2014-10-16 | Discharge: 2014-10-16 | Disposition: A | Discharge status: Home / Self Care | Payer: Medicare Other | Attending: Emergency Medicine | Admitting: Emergency Medicine

## 2014-10-16 ENCOUNTER — Encounter (HOSPITAL_BASED_OUTPATIENT_CLINIC_OR_DEPARTMENT_OTHER): Payer: Self-pay

## 2014-10-16 ENCOUNTER — Emergency Department (HOSPITAL_BASED_OUTPATIENT_CLINIC_OR_DEPARTMENT_OTHER): Payer: Medicare Other

## 2014-10-16 DIAGNOSIS — E1140 Type 2 diabetes mellitus with diabetic neuropathy, unspecified: Secondary | ICD-10-CM | POA: Insufficient documentation

## 2014-10-16 DIAGNOSIS — Z87448 Personal history of other diseases of urinary system: Secondary | ICD-10-CM | POA: Diagnosis not present

## 2014-10-16 DIAGNOSIS — X58XXXA Exposure to other specified factors, initial encounter: Secondary | ICD-10-CM | POA: Insufficient documentation

## 2014-10-16 DIAGNOSIS — L03116 Cellulitis of left lower limb: Secondary | ICD-10-CM

## 2014-10-16 DIAGNOSIS — Z7982 Long term (current) use of aspirin: Secondary | ICD-10-CM | POA: Insufficient documentation

## 2014-10-16 DIAGNOSIS — Y998 Other external cause status: Secondary | ICD-10-CM | POA: Insufficient documentation

## 2014-10-16 DIAGNOSIS — S90425A Blister (nonthermal), left lesser toe(s), initial encounter: Secondary | ICD-10-CM | POA: Diagnosis not present

## 2014-10-16 DIAGNOSIS — M79675 Pain in left toe(s): Secondary | ICD-10-CM | POA: Diagnosis present

## 2014-10-16 DIAGNOSIS — E11621 Type 2 diabetes mellitus with foot ulcer: Secondary | ICD-10-CM | POA: Diagnosis not present

## 2014-10-16 DIAGNOSIS — S99922A Unspecified injury of left foot, initial encounter: Secondary | ICD-10-CM | POA: Diagnosis not present

## 2014-10-16 DIAGNOSIS — I2510 Atherosclerotic heart disease of native coronary artery without angina pectoris: Secondary | ICD-10-CM | POA: Diagnosis not present

## 2014-10-16 DIAGNOSIS — Y9353 Activity, golf: Secondary | ICD-10-CM | POA: Diagnosis not present

## 2014-10-16 DIAGNOSIS — Z87442 Personal history of urinary calculi: Secondary | ICD-10-CM | POA: Insufficient documentation

## 2014-10-16 DIAGNOSIS — Z87891 Personal history of nicotine dependence: Secondary | ICD-10-CM | POA: Insufficient documentation

## 2014-10-16 DIAGNOSIS — Z7952 Long term (current) use of systemic steroids: Secondary | ICD-10-CM | POA: Insufficient documentation

## 2014-10-16 DIAGNOSIS — L97529 Non-pressure chronic ulcer of other part of left foot with unspecified severity: Secondary | ICD-10-CM | POA: Diagnosis not present

## 2014-10-16 DIAGNOSIS — Z8709 Personal history of other diseases of the respiratory system: Secondary | ICD-10-CM | POA: Insufficient documentation

## 2014-10-16 DIAGNOSIS — S90414A Abrasion, right lesser toe(s), initial encounter: Secondary | ICD-10-CM | POA: Diagnosis not present

## 2014-10-16 DIAGNOSIS — Z9889 Other specified postprocedural states: Secondary | ICD-10-CM | POA: Diagnosis not present

## 2014-10-16 DIAGNOSIS — Z79899 Other long term (current) drug therapy: Secondary | ICD-10-CM | POA: Diagnosis not present

## 2014-10-16 DIAGNOSIS — I10 Essential (primary) hypertension: Secondary | ICD-10-CM | POA: Diagnosis not present

## 2014-10-16 DIAGNOSIS — Y9239 Other specified sports and athletic area as the place of occurrence of the external cause: Secondary | ICD-10-CM | POA: Insufficient documentation

## 2014-10-16 DIAGNOSIS — M1990 Unspecified osteoarthritis, unspecified site: Secondary | ICD-10-CM | POA: Insufficient documentation

## 2014-10-16 LAB — CBG MONITORING, ED: Glucose-Capillary: 116 mg/dL — ABNORMAL HIGH (ref 65–99)

## 2014-10-16 MED ORDER — BACITRACIN 500 UNIT/GM EX OINT
1.0000 "application " | TOPICAL_OINTMENT | Freq: Two times a day (BID) | CUTANEOUS | Status: DC
  Administered 2014-10-16: 1 via TOPICAL
  Filled 2014-10-16: qty 0.9

## 2014-10-16 MED ORDER — CLINDAMYCIN HCL 150 MG PO CAPS: 450.0000 mg | ORAL_CAPSULE | Freq: Four times a day (QID) | ORAL | Status: DC

## 2014-10-16 MED ORDER — CEFTRIAXONE SODIUM 1 G IJ SOLR
1.0000 g | Freq: Once | INTRAMUSCULAR | Status: AC
  Administered 2014-10-16: 1 g via INTRAMUSCULAR
  Filled 2014-10-16: qty 10

## 2014-10-16 MED ORDER — LIDOCAINE HCL (PF) 1 % IJ SOLN
INTRAMUSCULAR | Status: AC
  Administered 2014-10-16: 5 mL
  Filled 2014-10-16: qty 5

## 2014-10-16 NOTE — ED Notes (Signed)
Pt reports played golf 5 days ago, got blisters on his left third digit and now with ? Infection.  Noted pressure ulcer on bottom of same, no purulent drainage.

## 2014-10-16 NOTE — Discharge Instructions (Signed)
Wash the affected area with soap and water and apply a thin layer of topical antibiotic ointment. Do this every 12 hours.   Do not use rubbing alcohol or hydrogen peroxide.                        Look for signs of worsening infection: if you see redness, if the area becomes warm, if pain increases sharply, there is discharge (pus), if red streaks appear or you develop fever or vomiting, RETURN immediately to the Emergency Department  for a recheck.   Please follow with your primary care doctor in the next 2 days for a check-up. They must obtain records for further management.   Do not hesitate to return to the Emergency Department for any new, worsening or concerning symptoms.    Cellulitis Cellulitis is an infection of the skin and the tissue beneath it. The infected area is usually red and tender. Cellulitis occurs most often in the arms and lower legs.  CAUSES  Cellulitis is caused by bacteria that enter the skin through cracks or cuts in the skin. The most common types of bacteria that cause cellulitis are staphylococci and streptococci. SIGNS AND SYMPTOMS   Redness and warmth.  Swelling.  Tenderness or pain.  Fever. DIAGNOSIS  Your health care provider can usually determine what is wrong based on a physical exam. Blood tests may also be done. TREATMENT  Treatment usually involves taking an antibiotic medicine. HOME CARE INSTRUCTIONS   Take your antibiotic medicine as directed by your health care provider. Finish the antibiotic even if you start to feel better.  Keep the infected arm or leg elevated to reduce swelling.  Apply a warm cloth to the affected area up to 4 times per day to relieve pain.  Take medicines only as directed by your health care provider.  Keep all follow-up visits as directed by your health care provider. SEEK MEDICAL CARE IF:   You notice red streaks coming from the infected area.  Your red area gets larger or turns dark in color.  Your bone or  joint underneath the infected area becomes painful after the skin has healed.  Your infection returns in the same area or another area.  You notice a swollen bump in the infected area.  You develop new symptoms.  You have a fever. SEEK IMMEDIATE MEDICAL CARE IF:   You feel very sleepy.  You develop vomiting or diarrhea.  You have a general ill feeling (malaise) with muscle aches and pains. MAKE SURE YOU:   Understand these instructions.  Will watch your condition.  Will get help right away if you are not doing well or get worse. Document Released: 02/21/2005 Document Revised: 09/28/2013 Document Reviewed: 07/30/2011 [facility] Patient Information 2015 [place], [place]. This information is not intended to replace advice given to you by your health care provider. Make sure you discuss any questions you have with your health care provider.

## 2014-10-16 NOTE — ED Provider Notes (Signed)
CSN: [id number]     Arrival date & time 10/16/14  1606 History   First MD Initiated Contact with Patient 10/16/14 1726     Chief Complaint  Patient presents with  . Toe Pain     (Consider location/radiation/quality/duration/timing/severity/associated sxs/prior Treatment) HPI  [name] is a 74 y.o. male non-insulin-dependent diabetic with peripheral neuropathy complaining of blister to left third distal toe after playing golf 5 days ago. Patient noticed a significant erythema and swelling to the foot yesterday which has improved over the last 24 hours. Patient denies fever, chills, nausea, vomiting, streaking up the leg. He follows with Dr. Sharol Given. His last tetanus shot was within the last year. He denies any pain, no prior amputations.  Past Medical History  Diagnosis Date  . ED (erectile dysfunction)   . Rhinitis   . Hypertension   . Osteoarthritis   . CAD (coronary artery disease)     Minimal plaque cath 2008  . Rheumatic fever   . Diabetes mellitus   . Recurrent kidney stones    Past Surgical History  Procedure Laterality Date  . Inguinal hernia repair    . Colonoscopy  2006, 2010   Family History  Problem Relation Age of Onset  . Stroke Father 40    Died age 56  . Coronary artery disease Brother 57   History  Substance Use Topics  . Smoking status: Former Smoker    Quit date: 07/26/1981  . Smokeless tobacco: Not on file  . Alcohol Use: 1.8 oz/week    3 Cans of beer per week    Review of Systems  10 systems reviewed and found to be negative, except as noted in the HPI.  Allergies  Review of patient's allergies indicates no known allergies.  Home Medications   Prior to Admission medications   Medication Sig Start Date End Date Taking? Authorizing Provider  ACCU-CHEK AVIVA PLUS test strip  09/06/14   Historical Provider, MD  aspirin 81 MG tablet Take 81 mg by mouth as needed for pain (pain).     Historical Provider, MD  atenolol-chlorthalidone (TENORETIC)  50-25 MG per tablet One half tab in the morning 09/21/14   Dorena Cookey, MD  Ginger, Zingiber officinalis, (GINGER PO) Take 1 capsule by mouth daily.    Historical Provider, MD  guaiFENesin (ROBITUSSIN) 100 MG/5ML SOLN Take 5 mLs by mouth every 4 (four) hours as needed for cough or to loosen phlegm.    Historical Provider, MD  halobetasol (ULTRAVATE) 0.05 % cream Apply topically 2 (two) times daily. to affected area 09/21/14   Dorena Cookey, MD  loratadine (CLARITIN) 10 MG tablet Take 10 mg by mouth daily.    Historical Provider, MD  metFORMIN (GLUCOPHAGE) 1000 MG tablet Take 1 tablet (1,000 mg total) by mouth 2 (two) times daily. 09/21/14   Dorena Cookey, MD  potassium chloride SA (K-DUR,KLOR-CON) 20 MEQ tablet 2 tabs daily in the morning 09/21/14   Dorena Cookey, MD   BP 128/69 mmHg  Pulse 91  Temp(Src) 98.3 F (36.8 C) (Oral)  Resp 18  Ht 5\' 10"  (1.778 m)  Wt 205 lb (92.987 kg)  BMI 29.41 kg/m2  SpO2 97% Physical Exam  Constitutional: He is oriented to person, place, and time. He appears well-developed and well-nourished. No distress.  HENT:  Head: Normocephalic.  Eyes: Conjunctivae and EOM are normal.  Cardiovascular: Normal rate.   Pulmonary/Chest: Effort normal. No stridor.  Musculoskeletal: Normal range of motion.  Neurological: He  is alert and oriented to person, place, and time.  Skin:  1.25 cm partial-thickness abrasion to volar side of right third toe distal phalanx. Patient has erythema and warmth to the foot, no focal fluctuance, no streaking up the leg. Neurovascularly intact.  Psychiatric: He has a normal mood and affect.  Nursing note and vitals reviewed.       ED Course  Procedures (including critical care time) Labs Review Labs Reviewed  CBG MONITORING, ED - Abnormal; Notable for the following:    Glucose-Capillary 116 (*)    All other components within normal limits    Imaging Review Dg Toe 3rd Left  10/16/2014   CLINICAL DATA:  Left 3rd digit toe  injury, blister  EXAM: LEFT THIRD TOE  COMPARISON:  None.  FINDINGS: No fracture or dislocation is seen.  Degenerative changes, most prominently involving the second PIP joint.  Mild soft tissue swelling along the distal 3rd digit.  No radiopaque foreign body is seen.  IMPRESSION: No acute osseus abnormality is seen.  Soft tissue swelling along the distal 3rd digit.  No radiopaque foreign body is seen.   Electronically Signed   By: Julian Hy M.D.   On: 10/16/2014 16:54     EKG Interpretation None      MDM   Final diagnoses:  Cellulitis of left foot  Diabetic ulcer of left foot associated with type 2 diabetes mellitus    Filed Vitals:   10/16/14 1613 10/16/14 1811 10/16/14 1833 10/16/14 1841  BP: 128/69 152/91  143/80  Pulse: 91 95 81   Temp: 98.3 F (36.8 C)     TempSrc: Oral     Resp: 18 18    Height: 5\' 10"  (1.778 m)     Weight: 205 lb (92.987 kg)     SpO2: 97% 98% 94%     Medications  bacitracin ointment 1 application (1 application Topical Given by Other 10/16/14 1840)  cefTRIAXone (ROCEPHIN) injection 1 g (1 g Intramuscular Given 10/16/14 1815)  lidocaine (PF) (XYLOCAINE) 1 % injection (5 mLs  Given 10/16/14 1816)    [name] is a pleasant 74 y.o. male presenting with ulceration to the right third digit secondary to diabetic neuropathy, patient states that he notes that after he had a long day of golf. Patient reports erythema to the dorsum of the foot that has improved over the last day. No systemic signs of infection. Pt folllows with Dr. Sharol Given, recommend he check in with him for wound check this week, with had an extensive discussion of return. Patient is adamant that he would like to leave the emergency room, declines blood work. Wound is dressed and extensive discussion of return precautions with patient and his daughter.  Evaluation does not show pathology that would require ongoing emergent intervention or inpatient treatment. Pt is hemodynamically stable and  mentating appropriately. Discussed findings and plan with patient/guardian, who agrees with care plan. All questions answered. Return precautions discussed and outpatient follow up given.   Discharge Medication List as of 10/16/2014  6:10 PM    START taking these medications   Details  clindamycin (CLEOCIN) 150 MG capsule Take 3 capsules (450 mg total) by mouth 4 (four) times daily., Starting 10/16/2014, Until Discontinued, Print             Monico Blitz, PA-C 10/16/14 2346  Charlesetta Shanks, MD 10/19/14 [phone]

## 2014-10-18 ENCOUNTER — Other Ambulatory Visit: Payer: Self-pay | Admitting: Family Medicine

## 2014-10-21 DIAGNOSIS — L89521 Pressure ulcer of left ankle, stage 1: Secondary | ICD-10-CM | POA: Diagnosis not present

## 2014-10-22 ENCOUNTER — Ambulatory Visit: Payer: PRIVATE HEALTH INSURANCE | Admitting: Gastroenterology

## 2014-10-22 ENCOUNTER — Encounter: Payer: Self-pay | Admitting: Gastroenterology

## 2014-10-22 ENCOUNTER — Ambulatory Visit (INDEPENDENT_AMBULATORY_CARE_PROVIDER_SITE_OTHER): Payer: Medicare Other | Admitting: Gastroenterology

## 2014-10-22 VITALS — BP 136/60 | HR 56 | Ht 71.0 in | Wt 221.5 lb

## 2014-10-22 DIAGNOSIS — R195 Other fecal abnormalities: Secondary | ICD-10-CM | POA: Diagnosis not present

## 2014-10-22 NOTE — Patient Instructions (Signed)
You will be set up for a colonoscopy for routine screening, also change in bowels.

## 2014-10-22 NOTE — Progress Notes (Signed)
HPI: This is a  very pleasant 74 year old man who was referred to me by Dr. Dessa Phi to evaluate  dysphasia .    Chief complaint is change in bowels since acute illness 2 months ago  He was sent here for "dysphasia however he admits he has never had dysphagia. He has no trouble eating or taking pills. He has no GERD, no heartburn. While he was in the hospital 2 months ago he did have some upper GI symptoms of vomiting and it looks like he was eventually tested with barium esophagram and that showed a narrowing in his distal esophagus.  Was admitted 07/2014 with vomiting, diarrhea illness.  Has mainly recovered but still has loose stools (2-3 times in AM, non bloody, preventable with imodium).  Previously had intermittent loose stools.  Never nocturnal loose stools.  Felt blockage in abdomen.  Vomiting a lot. Then eventually some diarrhea.  No sick contacts around that time.  Lost some weight but that has stabilized.  He has NO dysphagia and never has.  No pyrosis.  He had colonoscopy here at [facility] with Dr. Deatra Ina.  Twice with Dr. Deatra Ina.  No colonoscopy reports here in [place]     Barium esophagram 07/2014: 1. No mucosal irregularity or mass in the esophagus. 2. Narrowing of the distal esophagus impeded the 13 mm barium. Recommend outpatient upper endoscopy to evaluate distal narrowing of the esophagus   Review of systems: Pertinent positive and negative review of systems were noted in the above HPI section. Complete review of systems was performed and was otherwise normal.   Past Medical History  Diagnosis Date  . ED (erectile dysfunction)   . Rhinitis   . Hypertension   . Osteoarthritis   . CAD (coronary artery disease)     Minimal plaque cath 2008  . Rheumatic fever   . Diabetes mellitus   . Recurrent kidney stones     Past Surgical History  Procedure Laterality Date  . Inguinal hernia repair    . Colonoscopy  2006, 2010    Current Outpatient Prescriptions   Medication Sig Dispense Refill  . ACCU-CHEK AVIVA PLUS test strip   3  . aspirin 81 MG tablet Take 81 mg by mouth as needed for pain (pain).     Marland Kitchen atenolol-chlorthalidone (TENORETIC) 50-25 MG per tablet One half tab in the morning 90 tablet 3  . clindamycin (CLEOCIN) 150 MG capsule Take 3 capsules (450 mg total) by mouth 4 (four) times daily. 84 capsule 0  . Ginger, Zingiber officinalis, (GINGER PO) Take 1 capsule by mouth daily.    Marland Kitchen guaiFENesin (ROBITUSSIN) 100 MG/5ML SOLN Take 5 mLs by mouth every 4 (four) hours as needed for cough or to loosen phlegm.    . halobetasol (ULTRAVATE) 0.05 % cream Apply topically 2 (two) times daily. to affected area (Patient taking differently: Apply topically as needed. to affected area) 50 g 0  . loratadine (CLARITIN) 10 MG tablet Take 10 mg by mouth daily.    . metFORMIN (GLUCOPHAGE) 1000 MG tablet TAKE ONE TABLET BY MOUTH TWICE DAILY 180 tablet 3  . potassium chloride SA (K-DUR,KLOR-CON) 20 MEQ tablet 2 tabs daily in the morning 200 tablet 3   No current facility-administered medications for this visit.    Allergies as of 10/22/2014  . (No Known Allergies)    Family History  Problem Relation Age of Onset  . Stroke Father 25    Died age 37  . Coronary artery disease Brother 57  .  Colon cancer Neg Hx   . Colon polyps Neg Hx   . Diabetes Neg Hx   . Esophageal cancer Neg Hx   . Kidney disease Neg Hx   . Gallbladder disease Neg Hx     History   Social History  . Marital Status: Married    Spouse Name: N/A  . Number of Children: 1  . Years of Education: N/A   Occupational History  . Retired    Social History Main Topics  . Smoking status: Former Smoker    Quit date: 07/26/1981  . Smokeless tobacco: Never Used  . Alcohol Use: 1.8 oz/week    3 Cans of beer per week     Comment: Occassionally  . Drug Use: No  . Sexual Activity: No   Other Topics Concern  . Not on file   Social History Narrative   Lives with wife.     Physical  Exam: BP 136/60 mmHg  Pulse 56  Ht 5\' 11"  (1.803 m)  Wt 221 lb 8 oz (100.472 kg)  BMI 30.91 kg/m2 Constitutional: generally well-appearing Psychiatric: alert and oriented x3 Eyes: extraocular movements intact Mouth: oral pharynx moist, no lesions Neck: supple no lymphadenopathy Cardiovascular: heart regular rate and rhythm Lungs: clear to auscultation bilaterally Abdomen: soft, nontender, nondistended, no obvious ascites, no peritoneal signs, normal bowel sounds Extremities: no lower extremity edema bilaterally Skin: no lesions on visible extremities   Assessment and plan: 74 y.o. male with  previous abnormal barium esophagram, change in bowels since acute illness 2 months ago.  He tells me today he has never had dysphagia before or after his acute illness. He swallows completely normally. I suspect the abnormal barium esophagram while he was hospitalized is related to vomiting related edema in his lower esophagus. I do not think he has underlying esophageal pathology currently. I am more struck by his persistent change in bowels since his acute illness. This might be just postinfectious loose stools. Looking back at charting here in [place] system. I can tell he had colonoscopy 2001 and also 2006. Neither of these showed adenomatous polyps. A letter written to him in 2011 suggested he needed recall colonoscopy in May 2016. It does appear that he is "do" for routine colon cancer screening around now and given his change in bowel habits I think we should indeed proceed with colonoscopy.   Owens Loffler, MD [facility] 10/22/2014, 9:53 AM  Cc: Dr. Jacinta Shoe

## 2014-11-10 DIAGNOSIS — L97421 Non-pressure chronic ulcer of left heel and midfoot limited to breakdown of skin: Secondary | ICD-10-CM | POA: Diagnosis not present

## 2014-11-10 DIAGNOSIS — M86072 Acute hematogenous osteomyelitis, left ankle and foot: Secondary | ICD-10-CM | POA: Diagnosis not present

## 2014-11-10 DIAGNOSIS — M79676 Pain in unspecified toe(s): Secondary | ICD-10-CM | POA: Diagnosis not present

## 2014-11-10 DIAGNOSIS — L89521 Pressure ulcer of left ankle, stage 1: Secondary | ICD-10-CM | POA: Diagnosis not present

## 2014-11-15 DIAGNOSIS — M79676 Pain in unspecified toe(s): Secondary | ICD-10-CM | POA: Diagnosis not present

## 2014-11-15 DIAGNOSIS — L97421 Non-pressure chronic ulcer of left heel and midfoot limited to breakdown of skin: Secondary | ICD-10-CM | POA: Diagnosis not present

## 2014-11-23 DIAGNOSIS — L97421 Non-pressure chronic ulcer of left heel and midfoot limited to breakdown of skin: Secondary | ICD-10-CM | POA: Diagnosis not present

## 2014-12-24 ENCOUNTER — Encounter: Payer: Medicare Other | Admitting: Gastroenterology

## 2014-12-29 DIAGNOSIS — L89521 Pressure ulcer of left ankle, stage 1: Secondary | ICD-10-CM | POA: Diagnosis not present

## 2015-01-04 DIAGNOSIS — D692 Other nonthrombocytopenic purpura: Secondary | ICD-10-CM | POA: Diagnosis not present

## 2015-01-04 DIAGNOSIS — C44319 Basal cell carcinoma of skin of other parts of face: Secondary | ICD-10-CM | POA: Diagnosis not present

## 2015-01-04 DIAGNOSIS — D1801 Hemangioma of skin and subcutaneous tissue: Secondary | ICD-10-CM | POA: Diagnosis not present

## 2015-01-04 DIAGNOSIS — D485 Neoplasm of uncertain behavior of skin: Secondary | ICD-10-CM | POA: Diagnosis not present

## 2015-01-04 DIAGNOSIS — L821 Other seborrheic keratosis: Secondary | ICD-10-CM | POA: Diagnosis not present

## 2015-01-04 DIAGNOSIS — Z85828 Personal history of other malignant neoplasm of skin: Secondary | ICD-10-CM | POA: Diagnosis not present

## 2015-01-04 DIAGNOSIS — L570 Actinic keratosis: Secondary | ICD-10-CM | POA: Diagnosis not present

## 2015-01-04 DIAGNOSIS — L218 Other seborrheic dermatitis: Secondary | ICD-10-CM | POA: Diagnosis not present

## 2015-01-28 ENCOUNTER — Other Ambulatory Visit: Payer: Self-pay | Admitting: *Deleted

## 2015-01-28 MED ORDER — LORAZEPAM 0.5 MG PO TABS: 0.5000 mg | ORAL_TABLET | Freq: Two times a day (BID) | ORAL | Status: DC | PRN

## 2015-01-28 MED ORDER — TRAMADOL HCL 50 MG PO TABS: ORAL_TABLET | ORAL | Status: DC

## 2015-02-01 DIAGNOSIS — Z85828 Personal history of other malignant neoplasm of skin: Secondary | ICD-10-CM | POA: Diagnosis not present

## 2015-02-01 DIAGNOSIS — C44319 Basal cell carcinoma of skin of other parts of face: Secondary | ICD-10-CM | POA: Diagnosis not present

## 2015-02-10 DIAGNOSIS — L97411 Non-pressure chronic ulcer of right heel and midfoot limited to breakdown of skin: Secondary | ICD-10-CM | POA: Diagnosis not present

## 2015-02-10 DIAGNOSIS — L97421 Non-pressure chronic ulcer of left heel and midfoot limited to breakdown of skin: Secondary | ICD-10-CM | POA: Diagnosis not present

## 2015-02-22 ENCOUNTER — Other Ambulatory Visit (INDEPENDENT_AMBULATORY_CARE_PROVIDER_SITE_OTHER): Payer: Medicare Other

## 2015-02-22 DIAGNOSIS — E1165 Type 2 diabetes mellitus with hyperglycemia: Secondary | ICD-10-CM

## 2015-02-22 DIAGNOSIS — Z0000 Encounter for general adult medical examination without abnormal findings: Secondary | ICD-10-CM

## 2015-02-22 DIAGNOSIS — IMO0002 Reserved for concepts with insufficient information to code with codable children: Secondary | ICD-10-CM

## 2015-02-22 LAB — BASIC METABOLIC PANEL
BUN: 17 mg/dL (ref 6–23)
CO2: 27 mEq/L (ref 19–32)
Calcium: 9.6 mg/dL (ref 8.4–10.5)
Chloride: 101 mEq/L (ref 96–112)
Creatinine, Ser: 0.71 mg/dL (ref 0.40–1.50)
GFR: 114.85 mL/min (ref >60.00)
Glucose, Bld: 147 mg/dL — ABNORMAL HIGH (ref 70–99)
Potassium: 4 mEq/L (ref 3.5–5.1)
Sodium: 139 mEq/L (ref 135–145)

## 2015-02-22 LAB — HEMOGLOBIN A1C: Hgb A1c MFr Bld: 5.8 % (ref 4.6–6.5)

## 2015-03-01 ENCOUNTER — Ambulatory Visit (INDEPENDENT_AMBULATORY_CARE_PROVIDER_SITE_OTHER): Payer: Medicare Other | Admitting: Adult Health

## 2015-03-01 ENCOUNTER — Encounter: Payer: Self-pay | Admitting: Adult Health

## 2015-03-01 VITALS — BP 114/68 | Temp 97.8°F | Ht 71.0 in | Wt 221.8 lb

## 2015-03-01 DIAGNOSIS — I10 Essential (primary) hypertension: Secondary | ICD-10-CM | POA: Diagnosis not present

## 2015-03-01 DIAGNOSIS — F329 Major depressive disorder, single episode, unspecified: Secondary | ICD-10-CM

## 2015-03-01 DIAGNOSIS — IMO0002 Reserved for concepts with insufficient information to code with codable children: Secondary | ICD-10-CM

## 2015-03-01 DIAGNOSIS — L97509 Non-pressure chronic ulcer of other part of unspecified foot with unspecified severity: Secondary | ICD-10-CM

## 2015-03-01 DIAGNOSIS — L97519 Non-pressure chronic ulcer of other part of right foot with unspecified severity: Secondary | ICD-10-CM | POA: Diagnosis not present

## 2015-03-01 DIAGNOSIS — F32A Depression, unspecified: Secondary | ICD-10-CM

## 2015-03-01 DIAGNOSIS — E11621 Type 2 diabetes mellitus with foot ulcer: Secondary | ICD-10-CM | POA: Diagnosis not present

## 2015-03-01 DIAGNOSIS — E1165 Type 2 diabetes mellitus with hyperglycemia: Secondary | ICD-10-CM

## 2015-03-01 DIAGNOSIS — Z794 Long term (current) use of insulin: Secondary | ICD-10-CM

## 2015-03-01 DIAGNOSIS — L97529 Non-pressure chronic ulcer of other part of left foot with unspecified severity: Secondary | ICD-10-CM

## 2015-03-01 MED ORDER — DOXYCYCLINE HYCLATE 100 MG PO CAPS: 100.0000 mg | ORAL_CAPSULE | Freq: Two times a day (BID) | ORAL | Status: DC

## 2015-03-01 NOTE — Progress Notes (Signed)
Subjective:    Patient ID: [name], male    DOB: May 07, 1941, 74 y.o.   MRN: [id number]  HPI  74 year old male, patient of Dr. Sherren Mocha, who presents to the office today for six month follow up of his diabetes and hypertension. He is taking 1000mg  Metformin BID, His blood pressure is controlled on Tenoretic.    Depression - He is feeling depressed, recently he mother in law has died and his wife's health is declining, she now needs to have someone come over in the morning to have her bathed and changed. He is unable to play golf or go to the [facility] for the last month and a half because of blister son his feet. He does not want to start any anti depressants at this time.   Bilateral foot wounds - He endorses having open wounds from blisters on bottoms of bilateral feet. He endorses that they appear to be healing well, and that the wound on the right leg is healing better than the one on the left. He has had a history of toe amputation in the past. Endorses bloody drainage from one on left foot.    Review of Systems  Eyes: Negative.   Cardiovascular: Negative.   Musculoskeletal: Negative.   Skin: Positive for wound (one small blister on bottom of left foot. Does not appear infected, healing well. ).  Psychiatric/Behavioral: Negative.   All other systems reviewed and are negative.  Past Medical History  Diagnosis Date  . ED (erectile dysfunction)   . Rhinitis   . Hypertension   . Osteoarthritis   . CAD (coronary artery disease)     Minimal plaque cath 2008  . Rheumatic fever   . Diabetes mellitus   . Recurrent kidney stones     Social History   Social History  . Marital Status: Married    Spouse Name: N/A  . Number of Children: 1  . Years of Education: N/A   Occupational History  . Retired    Social History Main Topics  . Smoking status: Former Smoker    Quit date: 07/26/1981  . Smokeless tobacco: Never Used  . Alcohol Use: 1.8 oz/week    3 Cans of beer per week    Comment: Occassionally  . Drug Use: No  . Sexual Activity: No   Other Topics Concern  . Not on file   Social History Narrative   Lives with wife.    Past Surgical History  Procedure Laterality Date  . Inguinal hernia repair    . Colonoscopy  2006, 2010    Family History  Problem Relation Age of Onset  . Stroke Father 68    Died age 56  . Coronary artery disease Brother 60  . Colon cancer Neg Hx   . Colon polyps Neg Hx   . Diabetes Neg Hx   . Esophageal cancer Neg Hx   . Kidney disease Neg Hx   . Gallbladder disease Neg Hx     No Known Allergies  Current Outpatient Prescriptions on File Prior to Visit  Medication Sig Dispense Refill  . ACCU-CHEK AVIVA PLUS test strip   3  . aspirin 81 MG tablet Take 81 mg by mouth as needed for pain (pain).     Marland Kitchen atenolol-chlorthalidone (TENORETIC) 50-25 MG per tablet One half tab in the morning 90 tablet 3  . Ginger, Zingiber officinalis, (GINGER PO) Take 1 capsule by mouth daily.    . halobetasol (ULTRAVATE) 0.05 % cream  Apply topically 2 (two) times daily. to affected area (Patient taking differently: Apply topically as needed. to affected area) 50 g 0  . loratadine (CLARITIN) 10 MG tablet Take 10 mg by mouth daily.    Marland Kitchen LORazepam (ATIVAN) 0.5 MG tablet Take 1 tablet (0.5 mg total) by mouth 2 (two) times daily as needed for anxiety. 60 tablet 5  . metFORMIN (GLUCOPHAGE) 1000 MG tablet TAKE ONE TABLET BY MOUTH TWICE DAILY 180 tablet 3  . potassium chloride SA (K-DUR,KLOR-CON) 20 MEQ tablet 2 tabs daily in the morning 200 tablet 3  . traMADol (ULTRAM) 50 MG tablet Half to one tab twice daily as needed for back pain 30 tablet 5   No current facility-administered medications on file prior to visit.    BP 114/68 mmHg  Temp(Src) 97.8 F (36.6 C) (Oral)  Ht 5\' 11"  (1.803 m)  Wt 221 lb 12.8 oz (100.608 kg)  BMI 30.95 kg/m2       Objective:   Physical Exam  Constitutional: He is oriented to person, place, and time. He appears  well-developed and well-nourished. No distress.  Cardiovascular: Normal rate, regular rhythm, normal heart sounds and intact distal pulses.  Exam reveals no gallop and no friction rub.   No murmur heard. Pulmonary/Chest: Effort normal and breath sounds normal. No respiratory distress. He has no wheezes. He has no rales. He exhibits no tenderness.  Musculoskeletal: Normal range of motion. He exhibits no edema or tenderness.  Neurological: He is alert and oriented to person, place, and time.  Skin: Skin is warm and dry. No rash noted. He is not diaphoretic. No erythema. No pallor.  Patient has small ulcer on bottom of right foot, this appears well healing. No drainage noted.   He has a larger ulcer on the bottom of his left foot. This does not appear to be healing well. Slight bloody drainage and redness to site.   Psychiatric: He has a normal mood and affect. His behavior is normal. Judgment and thought content normal.  Nursing note and vitals reviewed.      Assessment & Plan:  1. Depression - Does not want to start medication at this time.   2. Diabetic ulcer of both feet associated with type 2 diabetes mellitus (HCC) - doxycycline (VIBRAMYCIN) 100 MG capsule; Take 1 capsule (100 mg total) by mouth 2 (two) times daily.  Dispense: 28 capsule; Refill: 0 - Foot soaks in the evenings.  - Keep wounds covered with clean dressings and neosporin.  - Follow up on Tuesday.    3. Essential hypertension - Controlled 4. Uncontrolled type 2 diabetes mellitus with foot ulcer, with long-term current use of insulin (HCC) - Aic 5.8. Controlled with just Metformin.  - Follow up in 6 months with Dr. Sherren Mocha

## 2015-03-01 NOTE — Progress Notes (Signed)
Pre visit review using our clinic review tool, if applicable. No additional management support is needed unless otherwise documented below in the visit note. 

## 2015-03-01 NOTE — Patient Instructions (Signed)
It was great meeting you today!  I have sent in a prescription for Doxycycline, please take this twice a day for 14 days.   Follow up with me on Tuesday for wound check.   If you need anything in the meantime, please let me know.

## 2015-03-02 ENCOUNTER — Encounter: Payer: Self-pay | Admitting: Gastroenterology

## 2015-03-02 ENCOUNTER — Ambulatory Visit (AMBULATORY_SURGERY_CENTER): Payer: Medicare Other | Admitting: Gastroenterology

## 2015-03-02 VITALS — BP 138/86 | HR 68 | Temp 97.5°F | Resp 16

## 2015-03-02 DIAGNOSIS — Z1211 Encounter for screening for malignant neoplasm of colon: Secondary | ICD-10-CM

## 2015-03-02 DIAGNOSIS — E119 Type 2 diabetes mellitus without complications: Secondary | ICD-10-CM | POA: Diagnosis not present

## 2015-03-02 DIAGNOSIS — R197 Diarrhea, unspecified: Secondary | ICD-10-CM | POA: Diagnosis not present

## 2015-03-02 DIAGNOSIS — R195 Other fecal abnormalities: Secondary | ICD-10-CM

## 2015-03-02 LAB — GLUCOSE, CAPILLARY
Glucose-Capillary: 121 mg/dL — ABNORMAL HIGH (ref 65–99)
Glucose-Capillary: 115 mg/dL — ABNORMAL HIGH (ref 65–99)

## 2015-03-02 MED ORDER — SODIUM CHLORIDE 0.9 % IV SOLN: 500.0000 mL | INTRAVENOUS | Status: DC

## 2015-03-02 NOTE — Progress Notes (Signed)
A/ox3 pleased with MAC, report to Penny RN 

## 2015-03-02 NOTE — Op Note (Signed)
[place]  [address] [place], [id number]   COLONOSCOPY PROCEDURE REPORT  PATIENT: [name], [name]  MR#: [id number] BIRTHDATE: 07/01/1940 , 13  yrs. old GENDER: male ENDOSCOPIST: Milus Banister, MD REFERRED AT:[id number] Delora Fuel, M.D. PROCEDURE DATE:  03/02/2015 PROCEDURE:   Colonoscopy, screening First Screening Colonoscopy - Avg.  risk and is 50 yrs.  old or older - No.  Prior Negative Screening - Now for repeat screening. 10 or more years since last screening  History of Adenoma - Now for follow-up colonoscopy & has been > or = to 3 yrs.  N/A  Recommend repeat exam, <10 yrs? No ASA CLASS:   Class II INDICATIONS:Screening for colonic neoplasia and colonoscopy 2001, 2006 both without polyps. MEDICATIONS: Monitored anesthesia care and Propofol 200 mg IV  DESCRIPTION OF PROCEDURE:   After the risks benefits and alternatives of the procedure were thoroughly explained, informed consent was obtained.  The digital rectal exam revealed no abnormalities of the rectum.   The [id number]  endoscope was introduced through the anus and advanced to the cecum, which was identified by both the appendix and ileocecal valve. No adverse events experienced.   The quality of the prep was excellent.  The instrument was then slowly withdrawn as the colon was fully examined. Estimated blood loss is zero unless otherwise noted in this procedure report.   COLON FINDINGS: There was moderate diverticulosis noted in the left colon with associated colonic narrowing, muscular hypertrophy and tortuosity.   The examination was otherwise normal.  Retroflexed views revealed no abnormalities. The time to cecum = 7.5 Withdrawal time = 8.6   The scope was withdrawn and the procedure completed. COMPLICATIONS: There were no immediate complications.  ENDOSCOPIC IMPRESSION: 1.   There was moderate diverticulosis noted in the left colon 2.   The examination was otherwise  normal  RECOMMENDATIONS: You will not need further colon cancer screening since these types of tests usually stop around the age 45.  eSigned:  Milus Banister, MD 03/02/2015 10:12 AM

## 2015-03-02 NOTE — Patient Instructions (Signed)
YOU HAD AN ENDOSCOPIC PROCEDURE TODAY AT THE [facility]:   Refer to the procedure report that was given to you for any specific questions about what was found during the examination.  If the procedure report does not answer your questions, please call your gastroenterologist to clarify.  If you requested that your care partner not be given the details of your procedure findings, then the procedure report has been included in a sealed envelope for you to review at your convenience later.  YOU SHOULD EXPECT: Some feelings of bloating in the abdomen. Passage of more gas than usual.  Walking can help get rid of the air that was put into your GI tract during the procedure and reduce the bloating. If you had a lower endoscopy (such as a colonoscopy or flexible sigmoidoscopy) you may notice spotting of blood in your stool or on the toilet paper. If you underwent a bowel prep for your procedure, you may not have a normal bowel movement for a few days.  Please Note:  You might notice some irritation and congestion in your nose or some drainage.  This is from the oxygen used during your procedure.  There is no need for concern and it should clear up in a day or so.  SYMPTOMS TO REPORT IMMEDIATELY:   Following lower endoscopy (colonoscopy or flexible sigmoidoscopy):  Excessive amounts of blood in the stool  Significant tenderness or worsening of abdominal pains  Swelling of the abdomen that is new, acute  Fever of 100F or higher    For urgent or emergent issues, a gastroenterologist can be reached at any hour by calling [phone].   DIET: Your first meal following the procedure should be a small meal and then it is ok to progress to your normal diet. Heavy or fried foods are harder to digest and may make you feel nauseous or bloated.  Likewise, meals heavy in dairy and vegetables can increase bloating.  Drink plenty of fluids but you should avoid alcoholic beverages for 24  hours.  ACTIVITY:  You should plan to take it easy for the rest of today and you should NOT DRIVE or use heavy machinery until tomorrow (because of the sedation medicines used during the test).    FOLLOW UP: Our staff will call the number listed on your records the next business day following your procedure to check on you and address any questions or concerns that you may have regarding the information given to you following your procedure. If we do not reach you, we will leave a message.  However, if you are feeling well and you are not experiencing any problems, there is no need to return our call.  We will assume that you have returned to your regular daily activities without incident.  If any biopsies were taken you will be contacted by phone or by letter within the next 1-3 weeks.  Please call us at [phone] if you have not heard about the biopsies in 3 weeks.    SIGNATURES/CONFIDENTIALITY: You and/or your care partner have signed paperwork which will be entered into your electronic medical record.  These signatures attest to the fact that that the information above on your After Visit Summary has been reviewed and is understood.  Full responsibility of the confidentiality of this discharge information lies with you and/or your care-partner.   Information on diverticulosis given to you today 

## 2015-03-03 ENCOUNTER — Telehealth: Payer: Self-pay | Admitting: *Deleted

## 2015-03-03 NOTE — Telephone Encounter (Signed)
  Follow up Call-  Call back number 03/02/2015  Post procedure Call Back phone  # [phone]  Permission to leave phone message Yes     Patient questions:  Do you have a fever, pain , or abdominal swelling? No. Pain Score  0 *  Have you tolerated food without any problems? Yes.    Have you been able to return to your normal activities? Yes.    Do you have any questions about your discharge instructions: Diet   No. Medications  No. Follow up visit  No.  Do you have questions or concerns about your Care? No.  Actions: * If pain score is 4 or above: No action needed, pain <4.

## 2015-03-07 DIAGNOSIS — L97421 Non-pressure chronic ulcer of left heel and midfoot limited to breakdown of skin: Secondary | ICD-10-CM | POA: Diagnosis not present

## 2015-03-07 DIAGNOSIS — L97411 Non-pressure chronic ulcer of right heel and midfoot limited to breakdown of skin: Secondary | ICD-10-CM | POA: Diagnosis not present

## 2015-03-08 ENCOUNTER — Ambulatory Visit: Payer: Medicare Other | Admitting: Adult Health

## 2015-03-09 ENCOUNTER — Ambulatory Visit (INDEPENDENT_AMBULATORY_CARE_PROVIDER_SITE_OTHER): Payer: Medicare Other | Admitting: Adult Health

## 2015-03-09 ENCOUNTER — Encounter: Payer: Self-pay | Admitting: Adult Health

## 2015-03-09 VITALS — BP 104/70 | Temp 98.6°F | Ht 71.0 in | Wt 221.1 lb

## 2015-03-09 DIAGNOSIS — Z5189 Encounter for other specified aftercare: Secondary | ICD-10-CM

## 2015-03-09 NOTE — Progress Notes (Signed)
Subjective:    Patient ID: [name], male    DOB: 04-21-1941, 74 y.o.   MRN: [id number]  HPI  74 year old diabetic male who presents to the office today for one week follow up regarding starting antibiotic treatment for bilateral foot ulcers on the bottom of his feet. He endorses that he feels as though the wounds are healing well. Denies any symptoms of infection.   Is doing foot soaks at least once a day and is keeping the wounds covered with bandages and antibiotic ointment.   Review of Systems  Constitutional: Negative.   Skin: Positive for wound.  All other systems reviewed and are negative.  Past Medical History  Diagnosis Date  . ED (erectile dysfunction)   . Rhinitis   . Hypertension   . Osteoarthritis   . CAD (coronary artery disease)     Minimal plaque cath 2008  . Rheumatic fever   . Diabetes mellitus   . Recurrent kidney stones     Social History   Social History  . Marital Status: Married    Spouse Name: N/A  . Number of Children: 1  . Years of Education: N/A   Occupational History  . Retired    Social History Main Topics  . Smoking status: Former Smoker    Quit date: 07/26/1981  . Smokeless tobacco: Never Used  . Alcohol Use: 1.8 oz/week    3 Cans of beer per week     Comment: Occassionally  . Drug Use: No  . Sexual Activity: No   Other Topics Concern  . Not on file   Social History Narrative   Lives with wife.    Past Surgical History  Procedure Laterality Date  . Inguinal hernia repair    . Colonoscopy  2006, 2010    Family History  Problem Relation Age of Onset  . Stroke Father 32    Died age 29  . Coronary artery disease Brother 3  . Colon cancer Neg Hx   . Colon polyps Neg Hx   . Diabetes Neg Hx   . Esophageal cancer Neg Hx   . Kidney disease Neg Hx   . Gallbladder disease Neg Hx     No Known Allergies  Current Outpatient Prescriptions on File Prior to Visit  Medication Sig Dispense Refill  . ACCU-CHEK AVIVA  PLUS test strip   3  . aspirin 81 MG tablet Take 81 mg by mouth as needed for pain (pain).     Marland Kitchen atenolol-chlorthalidone (TENORETIC) 50-25 MG per tablet One half tab in the morning 90 tablet 3  . doxycycline (VIBRAMYCIN) 100 MG capsule Take 1 capsule (100 mg total) by mouth 2 (two) times daily. 28 capsule 0  . Ginger, Zingiber officinalis, (GINGER PO) Take 1 capsule by mouth daily.    . halobetasol (ULTRAVATE) 0.05 % cream Apply topically 2 (two) times daily. to affected area (Patient taking differently: Apply topically as needed. to affected area) 50 g 0  . Lancets (ACCU-CHEK MULTICLIX) lancets USE TO TEST ONCE DAILY  4  . loratadine (CLARITIN) 10 MG tablet Take 10 mg by mouth daily.    Marland Kitchen LORazepam (ATIVAN) 0.5 MG tablet Take 1 tablet (0.5 mg total) by mouth 2 (two) times daily as needed for anxiety. 60 tablet 5  . metFORMIN (GLUCOPHAGE) 1000 MG tablet TAKE ONE TABLET BY MOUTH TWICE DAILY 180 tablet 3  . mupirocin ointment (BACTROBAN) 2 % APPLY TO AFFECTED AREA TWICE DAILY  0  .  potassium chloride SA (K-DUR,KLOR-CON) 20 MEQ tablet 2 tabs daily in the morning 200 tablet 3  . traMADol (ULTRAM) 50 MG tablet Half to one tab twice daily as needed for back pain 30 tablet 5   No current facility-administered medications on file prior to visit.    BP 104/70 mmHg  Temp(Src) 98.6 F (37 C) (Oral)  Ht 5\' 11"  (1.803 m)  Wt 221 lb 1.6 oz (100.29 kg)  BMI 30.85 kg/m2       Objective:   Physical Exam  Constitutional: He is oriented to person, place, and time. He appears well-developed and well-nourished. No distress.  Musculoskeletal: Normal range of motion. He exhibits no edema or tenderness.  Neurological: He is alert and oriented to person, place, and time.  Skin: Skin is warm and dry. No rash noted. He is not diaphoretic. No erythema. No pallor.  Wound on bottom of left foot appear almost completley healed.   Wound on bottom of right foot appears to be healing well. Wound is currently about  the size of a dime. Appears pink and moist.   Dead skin was removed by patient's orthopedist.  No drainage, redness or warmth noted  Psychiatric: He has a normal mood and affect. His behavior is normal. Thought content normal.  Nursing note and vitals reviewed.    Assessment & Plan:  1. Visit for wound check - Appear well healing - Continue with abx therapy - Continue with foot soaks - Keep wounds clean and dry, covered with bandages.  - Follow up in 2 weeks or sooner if needed.

## 2015-03-09 NOTE — Patient Instructions (Signed)
It was a pleasure seeing you today.   Your wounds appear to be healing very well.   Finish your antibiotics, continue with the foot soaks and keep the wounds covered with a bandage.   Follow up in 2 weeks for a recheck unless you notice signs of infection or the wound grows larger.

## 2015-03-09 NOTE — Progress Notes (Signed)
Pre visit review using our clinic review tool, if applicable. No additional management support is needed unless otherwise documented below in the visit note. 

## 2015-03-22 DIAGNOSIS — L97421 Non-pressure chronic ulcer of left heel and midfoot limited to breakdown of skin: Secondary | ICD-10-CM | POA: Diagnosis not present

## 2015-03-22 DIAGNOSIS — L97411 Non-pressure chronic ulcer of right heel and midfoot limited to breakdown of skin: Secondary | ICD-10-CM | POA: Diagnosis not present

## 2015-04-14 ENCOUNTER — Other Ambulatory Visit: Payer: Self-pay | Admitting: Family Medicine

## 2015-04-19 DIAGNOSIS — L97411 Non-pressure chronic ulcer of right heel and midfoot limited to breakdown of skin: Secondary | ICD-10-CM | POA: Diagnosis not present

## 2015-04-19 DIAGNOSIS — L97421 Non-pressure chronic ulcer of left heel and midfoot limited to breakdown of skin: Secondary | ICD-10-CM | POA: Diagnosis not present

## 2015-05-25 DIAGNOSIS — L97411 Non-pressure chronic ulcer of right heel and midfoot limited to breakdown of skin: Secondary | ICD-10-CM | POA: Diagnosis not present

## 2015-05-25 DIAGNOSIS — L97421 Non-pressure chronic ulcer of left heel and midfoot limited to breakdown of skin: Secondary | ICD-10-CM | POA: Diagnosis not present

## 2015-05-31 DIAGNOSIS — L820 Inflamed seborrheic keratosis: Secondary | ICD-10-CM | POA: Diagnosis not present

## 2015-05-31 DIAGNOSIS — Z85828 Personal history of other malignant neoplasm of skin: Secondary | ICD-10-CM | POA: Diagnosis not present

## 2015-06-20 DIAGNOSIS — E11610 Type 2 diabetes mellitus with diabetic neuropathic arthropathy: Secondary | ICD-10-CM | POA: Diagnosis not present

## 2015-06-20 DIAGNOSIS — L97411 Non-pressure chronic ulcer of right heel and midfoot limited to breakdown of skin: Secondary | ICD-10-CM | POA: Diagnosis not present

## 2015-06-27 ENCOUNTER — Other Ambulatory Visit: Payer: Self-pay | Admitting: Family Medicine

## 2015-06-27 MED ORDER — POTASSIUM CHLORIDE CRYS ER 20 MEQ PO TBCR: EXTENDED_RELEASE_TABLET | ORAL | Status: DC

## 2015-07-01 ENCOUNTER — Other Ambulatory Visit: Payer: Self-pay | Admitting: Family Medicine

## 2015-07-12 ENCOUNTER — Encounter: Payer: Self-pay | Admitting: Family Medicine

## 2015-07-12 ENCOUNTER — Ambulatory Visit (INDEPENDENT_AMBULATORY_CARE_PROVIDER_SITE_OTHER): Payer: Medicare Other | Admitting: Family Medicine

## 2015-07-12 VITALS — BP 130/80 | Temp 98.2°F | Wt 223.0 lb

## 2015-07-12 DIAGNOSIS — Z794 Long term (current) use of insulin: Secondary | ICD-10-CM

## 2015-07-12 DIAGNOSIS — L97519 Non-pressure chronic ulcer of other part of right foot with unspecified severity: Secondary | ICD-10-CM

## 2015-07-12 DIAGNOSIS — L97509 Non-pressure chronic ulcer of other part of unspecified foot with unspecified severity: Secondary | ICD-10-CM

## 2015-07-12 DIAGNOSIS — E11621 Type 2 diabetes mellitus with foot ulcer: Secondary | ICD-10-CM | POA: Diagnosis not present

## 2015-07-12 DIAGNOSIS — L97529 Non-pressure chronic ulcer of other part of left foot with unspecified severity: Secondary | ICD-10-CM

## 2015-07-12 DIAGNOSIS — E1165 Type 2 diabetes mellitus with hyperglycemia: Secondary | ICD-10-CM | POA: Diagnosis not present

## 2015-07-12 DIAGNOSIS — IMO0002 Reserved for concepts with insufficient information to code with codable children: Secondary | ICD-10-CM

## 2015-07-12 MED ORDER — CITALOPRAM HYDROBROMIDE 10 MG PO TABS: 10.0000 mg | ORAL_TABLET | Freq: Every day | ORAL | Status: DC

## 2015-07-12 NOTE — Progress Notes (Signed)
   Subjective:    Patient ID: [name], male    DOB: 08-07-1940, 75 y.o.   MRN: [id number]  HPI [name] is a 75 year old married male nonsmoker who has underlying diabetes type 2 comes in today concerned that his foot ulcers are not healing  He's been seen in the past by his orthopedist Dr. Sharol Given. He has severe fallen arches. He also has bilateral foot ulcers for the past 4 months that won't heal  He also complains of his blood sugar being elevated and anxiety and depression he comes in as an acute problem for all these reasons   Review of Systems    review of systems otherwise negative Objective:   Physical Exam  Well-developed well-nourished male no acute distress vital signs stable is afebrile he has medial foot ulcers on both feet      Assessment & Plan:  Bilateral foot ulcers............Marland Kitchen refer to the wound center and foot consult with Dr. Doran Durand ASAP

## 2015-07-12 NOTE — Patient Instructions (Signed)
Celexa 10 mg...........Marland Kitchen 1 at bedtime to help with depression and anxiety  We've put in a consult with Dr. Wylene Simmer ASAP............... he will examine you and give you a second opinion concerning what we can possibly do to get the foot ulcers to heal  Labs today

## 2015-07-13 LAB — BASIC METABOLIC PANEL
BUN: 19 mg/dL (ref 6–23)
CO2: 29 mEq/L (ref 19–32)
Calcium: 9.3 mg/dL (ref 8.4–10.5)
Chloride: 100 mEq/L (ref 96–112)
Creatinine, Ser: 0.76 mg/dL (ref 0.40–1.50)
GFR: 106.06 mL/min (ref >60.00)
Glucose, Bld: 109 mg/dL — ABNORMAL HIGH (ref 70–99)
Potassium: 4 mEq/L (ref 3.5–5.1)
Sodium: 138 mEq/L (ref 135–145)

## 2015-07-13 LAB — CBC WITH DIFFERENTIAL/PLATELET
Basophils Absolute: 0 10*3/uL (ref 0.0–0.1)
Basophils Relative: 0.4 % (ref 0.0–3.0)
Eosinophils Absolute: 0.3 10*3/uL (ref 0.0–0.7)
Eosinophils Relative: 4.4 % (ref 0.0–5.0)
HCT: 40.7 % (ref 39.0–52.0)
Hemoglobin: 13.9 g/dL (ref 13.0–17.0)
Lymphocytes Relative: 21.2 % (ref 12.0–46.0)
Lymphs Abs: 1.6 10*3/uL (ref 0.7–4.0)
MCHC: 34.1 g/dL (ref 30.0–36.0)
MCV: 92.3 fl (ref 78.0–100.0)
Monocytes Absolute: 0.8 10*3/uL (ref 0.1–1.0)
Monocytes Relative: 11 % (ref 3.0–12.0)
Neutro Abs: 4.9 10*3/uL (ref 1.4–7.7)
Neutrophils Relative %: 63 % (ref 43.0–77.0)
Platelets: 202 10*3/uL (ref 150.0–400.0)
RBC: 4.41 Mil/uL (ref 4.22–5.81)
RDW: 13.2 % (ref 11.5–15.5)
WBC: 7.7 10*3/uL (ref 4.0–10.5)

## 2015-07-13 LAB — HEMOGLOBIN A1C: Hgb A1c MFr Bld: 5.8 % (ref 4.6–6.5)

## 2015-07-13 LAB — MICROALBUMIN / CREATININE URINE RATIO
Creatinine,U: 89.3 mg/dL
Microalb Creat Ratio: 0.8 mg/g (ref 0.0–30.0)
Microalb, Ur: <0.7 mg/dL (ref 0.0–1.9)

## 2015-07-20 DIAGNOSIS — L97512 Non-pressure chronic ulcer of other part of right foot with fat layer exposed: Secondary | ICD-10-CM | POA: Diagnosis not present

## 2015-07-20 DIAGNOSIS — E11610 Type 2 diabetes mellitus with diabetic neuropathic arthropathy: Secondary | ICD-10-CM | POA: Diagnosis not present

## 2015-07-28 DIAGNOSIS — E11610 Type 2 diabetes mellitus with diabetic neuropathic arthropathy: Secondary | ICD-10-CM | POA: Insufficient documentation

## 2015-07-28 DIAGNOSIS — E08621 Diabetes mellitus due to underlying condition with foot ulcer: Secondary | ICD-10-CM | POA: Diagnosis not present

## 2015-07-28 DIAGNOSIS — L97519 Non-pressure chronic ulcer of other part of right foot with unspecified severity: Secondary | ICD-10-CM | POA: Diagnosis not present

## 2015-07-28 DIAGNOSIS — Z7984 Long term (current) use of oral hypoglycemic drugs: Secondary | ICD-10-CM | POA: Diagnosis not present

## 2015-07-28 DIAGNOSIS — Z87891 Personal history of nicotine dependence: Secondary | ICD-10-CM | POA: Diagnosis not present

## 2015-07-28 DIAGNOSIS — L97529 Non-pressure chronic ulcer of other part of left foot with unspecified severity: Secondary | ICD-10-CM | POA: Diagnosis not present

## 2015-08-04 DIAGNOSIS — E1152 Type 2 diabetes mellitus with diabetic peripheral angiopathy with gangrene: Secondary | ICD-10-CM | POA: Diagnosis not present

## 2015-08-04 DIAGNOSIS — E08621 Diabetes mellitus due to underlying condition with foot ulcer: Secondary | ICD-10-CM | POA: Diagnosis not present

## 2015-08-04 DIAGNOSIS — E11621 Type 2 diabetes mellitus with foot ulcer: Secondary | ICD-10-CM | POA: Diagnosis not present

## 2015-08-04 DIAGNOSIS — L97529 Non-pressure chronic ulcer of other part of left foot with unspecified severity: Secondary | ICD-10-CM | POA: Diagnosis not present

## 2015-08-04 DIAGNOSIS — L97519 Non-pressure chronic ulcer of other part of right foot with unspecified severity: Secondary | ICD-10-CM | POA: Diagnosis not present

## 2015-08-04 DIAGNOSIS — E11610 Type 2 diabetes mellitus with diabetic neuropathic arthropathy: Secondary | ICD-10-CM | POA: Diagnosis not present

## 2015-08-11 DIAGNOSIS — Z87891 Personal history of nicotine dependence: Secondary | ICD-10-CM | POA: Diagnosis not present

## 2015-08-11 DIAGNOSIS — L97529 Non-pressure chronic ulcer of other part of left foot with unspecified severity: Secondary | ICD-10-CM | POA: Diagnosis not present

## 2015-08-11 DIAGNOSIS — L97519 Non-pressure chronic ulcer of other part of right foot with unspecified severity: Secondary | ICD-10-CM | POA: Diagnosis not present

## 2015-08-11 DIAGNOSIS — E11610 Type 2 diabetes mellitus with diabetic neuropathic arthropathy: Secondary | ICD-10-CM | POA: Diagnosis not present

## 2015-08-11 DIAGNOSIS — E08621 Diabetes mellitus due to underlying condition with foot ulcer: Secondary | ICD-10-CM | POA: Diagnosis not present

## 2015-08-18 DIAGNOSIS — Z7984 Long term (current) use of oral hypoglycemic drugs: Secondary | ICD-10-CM | POA: Diagnosis not present

## 2015-08-18 DIAGNOSIS — L97519 Non-pressure chronic ulcer of other part of right foot with unspecified severity: Secondary | ICD-10-CM | POA: Diagnosis not present

## 2015-08-18 DIAGNOSIS — Z87891 Personal history of nicotine dependence: Secondary | ICD-10-CM | POA: Diagnosis not present

## 2015-08-18 DIAGNOSIS — L97529 Non-pressure chronic ulcer of other part of left foot with unspecified severity: Secondary | ICD-10-CM | POA: Diagnosis not present

## 2015-08-18 DIAGNOSIS — E08621 Diabetes mellitus due to underlying condition with foot ulcer: Secondary | ICD-10-CM | POA: Diagnosis not present

## 2015-08-18 DIAGNOSIS — E11610 Type 2 diabetes mellitus with diabetic neuropathic arthropathy: Secondary | ICD-10-CM | POA: Diagnosis not present

## 2015-08-22 DIAGNOSIS — L97519 Non-pressure chronic ulcer of other part of right foot with unspecified severity: Secondary | ICD-10-CM | POA: Diagnosis not present

## 2015-08-22 DIAGNOSIS — Z7984 Long term (current) use of oral hypoglycemic drugs: Secondary | ICD-10-CM | POA: Diagnosis not present

## 2015-08-22 DIAGNOSIS — Z87891 Personal history of nicotine dependence: Secondary | ICD-10-CM | POA: Diagnosis not present

## 2015-08-22 DIAGNOSIS — L97529 Non-pressure chronic ulcer of other part of left foot with unspecified severity: Secondary | ICD-10-CM | POA: Diagnosis not present

## 2015-08-22 DIAGNOSIS — E11610 Type 2 diabetes mellitus with diabetic neuropathic arthropathy: Secondary | ICD-10-CM | POA: Diagnosis not present

## 2015-08-22 DIAGNOSIS — E1142 Type 2 diabetes mellitus with diabetic polyneuropathy: Secondary | ICD-10-CM | POA: Diagnosis not present

## 2015-08-23 DIAGNOSIS — E1142 Type 2 diabetes mellitus with diabetic polyneuropathy: Secondary | ICD-10-CM | POA: Insufficient documentation

## 2015-09-05 DIAGNOSIS — E08621 Diabetes mellitus due to underlying condition with foot ulcer: Secondary | ICD-10-CM | POA: Diagnosis not present

## 2015-09-05 DIAGNOSIS — Z7984 Long term (current) use of oral hypoglycemic drugs: Secondary | ICD-10-CM | POA: Diagnosis not present

## 2015-09-05 DIAGNOSIS — L97529 Non-pressure chronic ulcer of other part of left foot with unspecified severity: Secondary | ICD-10-CM | POA: Diagnosis not present

## 2015-09-05 DIAGNOSIS — L97519 Non-pressure chronic ulcer of other part of right foot with unspecified severity: Secondary | ICD-10-CM | POA: Diagnosis not present

## 2015-09-05 DIAGNOSIS — Z87891 Personal history of nicotine dependence: Secondary | ICD-10-CM | POA: Diagnosis not present

## 2015-09-05 DIAGNOSIS — E11610 Type 2 diabetes mellitus with diabetic neuropathic arthropathy: Secondary | ICD-10-CM | POA: Diagnosis not present

## 2015-09-05 DIAGNOSIS — E1142 Type 2 diabetes mellitus with diabetic polyneuropathy: Secondary | ICD-10-CM | POA: Diagnosis not present

## 2015-09-19 DIAGNOSIS — L97529 Non-pressure chronic ulcer of other part of left foot with unspecified severity: Secondary | ICD-10-CM | POA: Diagnosis not present

## 2015-09-19 DIAGNOSIS — Z7984 Long term (current) use of oral hypoglycemic drugs: Secondary | ICD-10-CM | POA: Diagnosis not present

## 2015-09-19 DIAGNOSIS — L97519 Non-pressure chronic ulcer of other part of right foot with unspecified severity: Secondary | ICD-10-CM | POA: Diagnosis not present

## 2015-09-19 DIAGNOSIS — E1142 Type 2 diabetes mellitus with diabetic polyneuropathy: Secondary | ICD-10-CM | POA: Diagnosis not present

## 2015-09-19 DIAGNOSIS — Z87891 Personal history of nicotine dependence: Secondary | ICD-10-CM | POA: Diagnosis not present

## 2015-09-19 DIAGNOSIS — E11610 Type 2 diabetes mellitus with diabetic neuropathic arthropathy: Secondary | ICD-10-CM | POA: Diagnosis not present

## 2015-09-19 DIAGNOSIS — E08621 Diabetes mellitus due to underlying condition with foot ulcer: Secondary | ICD-10-CM | POA: Diagnosis not present

## 2015-09-29 ENCOUNTER — Emergency Department (HOSPITAL_BASED_OUTPATIENT_CLINIC_OR_DEPARTMENT_OTHER): Payer: Medicare Other

## 2015-09-29 ENCOUNTER — Emergency Department (HOSPITAL_BASED_OUTPATIENT_CLINIC_OR_DEPARTMENT_OTHER)
Admission: EM | Admit: 2015-09-29 | Discharge: 2015-09-29 | Disposition: A | Discharge status: Home / Self Care | Payer: Medicare Other | Attending: Emergency Medicine | Admitting: Emergency Medicine

## 2015-09-29 ENCOUNTER — Encounter (HOSPITAL_BASED_OUTPATIENT_CLINIC_OR_DEPARTMENT_OTHER): Payer: Self-pay | Admitting: *Deleted

## 2015-09-29 DIAGNOSIS — Z794 Long term (current) use of insulin: Secondary | ICD-10-CM | POA: Insufficient documentation

## 2015-09-29 DIAGNOSIS — S20212A Contusion of left front wall of thorax, initial encounter: Secondary | ICD-10-CM | POA: Diagnosis not present

## 2015-09-29 DIAGNOSIS — Y939 Activity, unspecified: Secondary | ICD-10-CM | POA: Insufficient documentation

## 2015-09-29 DIAGNOSIS — S51812A Laceration without foreign body of left forearm, initial encounter: Secondary | ICD-10-CM | POA: Diagnosis not present

## 2015-09-29 DIAGNOSIS — M1990 Unspecified osteoarthritis, unspecified site: Secondary | ICD-10-CM | POA: Diagnosis not present

## 2015-09-29 DIAGNOSIS — Y929 Unspecified place or not applicable: Secondary | ICD-10-CM | POA: Insufficient documentation

## 2015-09-29 DIAGNOSIS — W19XXXA Unspecified fall, initial encounter: Secondary | ICD-10-CM

## 2015-09-29 DIAGNOSIS — S299XXA Unspecified injury of thorax, initial encounter: Secondary | ICD-10-CM | POA: Diagnosis present

## 2015-09-29 DIAGNOSIS — I10 Essential (primary) hypertension: Secondary | ICD-10-CM | POA: Insufficient documentation

## 2015-09-29 DIAGNOSIS — W228XXA Striking against or struck by other objects, initial encounter: Secondary | ICD-10-CM | POA: Diagnosis not present

## 2015-09-29 DIAGNOSIS — Y999 Unspecified external cause status: Secondary | ICD-10-CM | POA: Diagnosis not present

## 2015-09-29 DIAGNOSIS — Z87891 Personal history of nicotine dependence: Secondary | ICD-10-CM | POA: Diagnosis not present

## 2015-09-29 DIAGNOSIS — E119 Type 2 diabetes mellitus without complications: Secondary | ICD-10-CM | POA: Insufficient documentation

## 2015-09-29 DIAGNOSIS — R0781 Pleurodynia: Secondary | ICD-10-CM | POA: Diagnosis not present

## 2015-09-29 MED ORDER — BACITRACIN ZINC 500 UNIT/GM EX OINT
1.0000 "application " | TOPICAL_OINTMENT | Freq: Two times a day (BID) | CUTANEOUS | Status: DC
  Administered 2015-09-29: 1 via TOPICAL
  Filled 2015-09-29: qty 28.35

## 2015-09-29 NOTE — ED Provider Notes (Signed)
CSN: [id number]     Arrival date & time 09/29/15  1406 History   First MD Initiated Contact with Patient 09/29/15 1514     Chief Complaint  Patient presents with  . Rib Injury     (Consider location/radiation/quality/duration/timing/severity/associated sxs/prior Treatment) HPI Comments: Patient presents to the emergency department with chief complaint of left-sided rib pain. Patient states that he was backing up and turned suddenly causing him to lose his balance, he fell over and hit his left ribs on the corner of his dresser. He reports mild pain with palpation and movement, but is otherwise pain-free at rest. He states that he took some Tylenol for his symptoms with good relief. He is not anticoagulated. He denies any chest pain or shortness of breath. Denies any head injury or loss of consciousness. Denies any numbness, weakness, or tingling. Patient also sustained a mild abrasion/skin tear to his left wrist. There are no other associated symptoms.  The history is provided by the patient. No language interpreter was used.    Past Medical History  Diagnosis Date  . ED (erectile dysfunction)   . Rhinitis   . Hypertension   . Osteoarthritis   . CAD (coronary artery disease)     Minimal plaque cath 2008  . Rheumatic fever   . Diabetes mellitus   . Recurrent kidney stones    Past Surgical History  Procedure Laterality Date  . Inguinal hernia repair    . Colonoscopy  2006, 2010   Family History  Problem Relation Age of Onset  . Stroke Father 70    Died age 22  . Coronary artery disease Brother 42  . Colon cancer Neg Hx   . Colon polyps Neg Hx   . Diabetes Neg Hx   . Esophageal cancer Neg Hx   . Kidney disease Neg Hx   . Gallbladder disease Neg Hx    Social History  Substance Use Topics  . Smoking status: Former Smoker    Quit date: 07/26/1981  . Smokeless tobacco: Never Used  . Alcohol Use: 1.8 oz/week    3 Cans of beer per week     Comment: Occassionally    Review  of Systems  Constitutional: Negative for fever and chills.  Respiratory: Negative for shortness of breath.   Cardiovascular: Negative for chest pain.  Gastrointestinal: Negative for nausea, vomiting, diarrhea and constipation.  Genitourinary: Negative for dysuria.  Skin: Positive for wound.  All other systems reviewed and are negative.     Allergies  Review of patient's allergies indicates no known allergies.  Home Medications   Prior to Admission medications   Medication Sig Start Date End Date Taking? Authorizing Provider  ACCU-CHEK AVIVA PLUS test strip USE TO TEST DAILY AS INSTRUCTED PER MD**EMERGENCY FILL MEDICAL REASON** 07/01/15   Dorena Cookey, MD  aspirin 81 MG tablet Take 81 mg by mouth as needed for pain (pain).     Historical Provider, MD  atenolol-chlorthalidone (TENORETIC) 50-25 MG tablet TAKE ONE TABLET BY MOUTH ONE TIME DAILY Patient taking differently: t 04/14/15   Dorena Cookey, MD  citalopram (CELEXA) 10 MG tablet Take 1 tablet (10 mg total) by mouth daily. 07/12/15   Dorena Cookey, MD  halobetasol (ULTRAVATE) 0.05 % cream Apply topically 2 (two) times daily. to affected area Patient taking differently: Apply topically as needed. to affected area 09/21/14   Dorena Cookey, MD  Lancets (ACCU-CHEK MULTICLIX) lancets USE TO TEST ONCE DAILY 01/19/15   Historical Provider, MD  loratadine (CLARITIN) 10 MG tablet Take 10 mg by mouth daily.    Historical Provider, MD  LORazepam (ATIVAN) 0.5 MG tablet Take 1 tablet (0.5 mg total) by mouth 2 (two) times daily as needed for anxiety. 01/28/15   Dorena Cookey, MD  metFORMIN (GLUCOPHAGE) 1000 MG tablet TAKE ONE TABLET BY MOUTH TWICE DAILY 10/18/14   Dorena Cookey, MD  Multiple Vitamin (MULTIVITAMIN) tablet Take 1 tablet by mouth daily.    Historical Provider, MD  mupirocin ointment (BACTROBAN) 2 % APPLY TO AFFECTED AREA TWICE DAILY 12/30/14   Historical Provider, MD  potassium chloride SA (K-DUR,KLOR-CON) 20 MEQ tablet 2 tabs daily in  the morning 06/27/15   Dorena Cookey, MD  traMADol Veatrice Bourbon) 50 MG tablet Half to one tab twice daily as needed for back pain 01/28/15   Dorena Cookey, MD  TURMERIC PO Take 1 tablet by mouth daily.    Historical Provider, MD   BP 147/72 mmHg  Pulse 76  Temp(Src) 97.6 F (36.4 C) (Oral)  Resp 18  Ht 6' (1.829 m)  Wt 97.523 kg  BMI 29.15 kg/m2  SpO2 99% Physical Exam  Constitutional: He is oriented to person, place, and time. He appears well-developed and well-nourished.  HENT:  Head: Normocephalic and atraumatic.  No evidence of head injury  Eyes: Conjunctivae and EOM are normal. Pupils are equal, round, and reactive to light. Right eye exhibits no discharge. Left eye exhibits no discharge. No scleral icterus.  Neck: Normal range of motion. Neck supple. No JVD present.  Cardiovascular: Normal rate, regular rhythm and normal heart sounds.  Exam reveals no gallop and no friction rub.   No murmur heard. Pulmonary/Chest: Effort normal and breath sounds normal. No respiratory distress. He has no wheezes. He has no rales. He exhibits no tenderness.  4 x 4 centimeter contusion to left sided chest wall, no bony abnormality or deformity, mild tenderness to palpation, lungs are clear to auscultation  Abdominal: Soft. He exhibits no distension and no mass. There is no tenderness. There is no rebound and no guarding.  Musculoskeletal: Normal range of motion. He exhibits no edema or tenderness.  Neurological: He is alert and oriented to person, place, and time.  Skin: Skin is warm and dry.  Mild skin tear to left posterior wrist and forearm, no laceration  Psychiatric: He has a normal mood and affect. His behavior is normal. Judgment and thought content normal.  Nursing note and vitals reviewed.   ED Course  Procedures (including critical care time) Labs Review Labs Reviewed - No data to display  Imaging Review Dg Ribs Unilateral W/chest Left  09/29/2015  CLINICAL DATA:  Pain following fall  EXAM: LEFT RIBS AND CHEST - 3+ VIEW COMPARISON:  Chest radiograph August 04, 2014; chest CT August 05, 2014 FINDINGS: Frontal chest as well as oblique and cone-down lower rib images were obtained. The lungs are clear. The heart size and pulmonary vascularity are normal. No adenopathy. There is no appreciable pneumothorax or pleural effusion. There is no demonstrable rib fracture. IMPRESSION: No demonstrable rib fracture.  Lungs clear.  No pneumothorax. Electronically Signed   By: Lowella Grip III M.D.   On: 09/29/2015 14:52   I have personally reviewed and evaluated these images and lab results as part of my medical decision-making.   MDM   Final diagnoses:  Fall, initial encounter  Rib contusion, left, initial encounter  Skin tear of forearm without complication, left, initial encounter    Patient with  mechanical fall. He struck his left ribs on the chest of drawers. There is a contusion, and some tenderness, but there is no evidence of fracture or pneumothorax on chest x-ray. Patient is breathing easily. Will provide wound care to skin tears. Patient is stable and ready for discharge. Patient seen by and discussed with Dr. Oleta Mouse, who agrees with the plan.    Montine Circle, PA-C 09/29/15 [place] Liu, MD 09/29/15 [phone]

## 2015-09-29 NOTE — Discharge Instructions (Signed)
Chest Contusion A contusion is a deep bruise. Bruises happen when an injury causes bleeding under the skin. Signs of bruising include pain, puffiness (swelling), and discolored skin. The bruise may turn blue, purple, or yellow.  HOME CARE  Put ice on the injured area.  Put ice in a plastic bag.  Place a towel between the skin and the bag.  Leave the ice on for 15-20 minutes at a time, 03-04 times a day for the first 48 hours.  Only take medicine as told by your doctor.  Rest.  Take deep breaths (deep-breathing exercises) as told by your doctor.  Stop smoking if you smoke.  Do not lift objects over 5 pounds (2.3 kilograms) for 3 days or longer if told by your doctor. GET HELP RIGHT AWAY IF:   You have more bruising or puffiness.  You have pain that gets worse.  You have trouble breathing.  You are dizzy, weak, or pass out (faint).  You have blood in your pee (urine) or poop (stool).  You cough up or throw up (vomit) blood.  Your puffiness or pain is not helped with medicines. MAKE SURE YOU:   Understand these instructions.  Will watch your condition.  Will get help right away if you are not doing well or get worse.   This information is not intended to replace advice given to you by your health care provider. Make sure you discuss any questions you have with your health care provider.   Document Released: 10/31/2007 Document Revised: 02/06/2012 Document Reviewed: 11/05/2011 Elsevier Interactive Patient Education 2016 [place] A contusion is a deep bruise. Contusions are the result of a blunt injury to tissues and muscle fibers under the skin. The injury causes bleeding under the skin. The skin overlying the contusion may turn blue, purple, or yellow. Minor injuries will give you a painless contusion, but more severe contusions may stay painful and swollen for a few weeks.  CAUSES  This condition is usually caused by a blow, trauma, or direct force  to an area of the body. SYMPTOMS  Symptoms of this condition include:  Swelling of the injured area.  Pain and tenderness in the injured area.  Discoloration. The area may have redness and then turn blue, purple, or yellow. DIAGNOSIS  This condition is diagnosed based on a physical exam and medical history. An X-ray, CT scan, or MRI may be needed to determine if there are any associated injuries, such as broken bones (fractures). TREATMENT  Specific treatment for this condition depends on what area of the body was injured. In general, the best treatment for a contusion is resting, icing, applying pressure to (compression), and elevating the injured area. This is often called the RICE strategy. Over-the-counter anti-inflammatory medicines may also be recommended for pain control.  HOME CARE INSTRUCTIONS   Rest the injured area.  If directed, apply ice to the injured area:  Put ice in a plastic bag.  Place a towel between your skin and the bag.  Leave the ice on for 20 minutes, 2-3 times per day.  If directed, apply light compression to the injured area using an elastic bandage. Make sure the bandage is not wrapped too tightly. Remove and reapply the bandage as directed by your health care provider.  If possible, raise (elevate) the injured area above the level of your heart while you are sitting or lying down.  Take over-the-counter and prescription medicines only as told by your health care provider. SEEK  MEDICAL CARE IF:  Your symptoms do not improve after several days of treatment.  Your symptoms get worse.  You have difficulty moving the injured area. SEEK IMMEDIATE MEDICAL CARE IF:   You have severe pain.  You have numbness in a hand or foot.  Your hand or foot turns pale or cold.   This information is not intended to replace advice given to you by your health care provider. Make sure you discuss any questions you have with your health care provider.   Document  Released: 02/21/2005 Document Revised: 02/02/2015 Document Reviewed: 09/29/2014 Elsevier Interactive Patient Education 2016 [employer] Skin Tear Care A skin tear is a wound in which the top layer of skin has peeled off. This is a common problem with aging because the skin becomes thinner and more fragile as a person gets older. In addition, some medicines, such as oral corticosteroids, can lead to skin thinning if taken for long periods of time.  A skin tear is often repaired with tape or skin adhesive strips. This keeps the skin that has been peeled off in contact with the healthier skin beneath. Depending on the location of the wound, a bandage (dressing) may be applied over the tape or skin adhesive strips. Sometimes, during the healing process, the skin turns black and dies. Even when this happens, the torn skin acts as a good dressing until the skin underneath gets healthier and repairs itself. HOME CARE INSTRUCTIONS   Change dressings once per day or as directed by your caregiver.  Gently clean the skin tear and the area around the tear using saline solution or mild soap and water.  Do not rub the injured skin dry. Let the area air dry.  Apply petroleum jelly or an antibiotic cream or ointment to keep the tear moist. This will help the wound heal. Do not allow a scab to form.  If the dressing sticks before the next dressing change, moisten it with warm soapy water and gently remove it.  Protect the injured skin until it has healed.  Only take over-the-counter or prescription medicines as directed by your caregiver.  Take showers or baths using warm soapy water. Apply a new dressing after the shower or bath.  Keep all follow-up appointments as directed by your caregiver.  SEEK IMMEDIATE MEDICAL CARE IF:   You have redness, swelling, or increasing pain in the skin tear.  You havepus coming from the skin tear.  You have chills.  You have a red streak that goes away from the  skin tear.  You have a bad smell coming from the tear or dressing.  You have a fever or persistent symptoms for more than 2-3 days.  You have a fever and your symptoms suddenly get worse. MAKE SURE YOU:  Understand these instructions.  Will watch this condition.  Will get help right away if your child is not doing well or gets worse.   This information is not intended to replace advice given to you by your health care provider. Make sure you discuss any questions you have with your health care provider.   Document Released: 02/06/2001 Document Revised: 02/06/2012 Document Reviewed: 11/26/2011 Elsevier Interactive Patient Education [employer].

## 2015-09-29 NOTE — ED Notes (Signed)
He lost his balance and fell. His left ribs hit the dresser. Pain with inspiration. Abrasion to his left forearm.

## 2015-10-04 DIAGNOSIS — M1990 Unspecified osteoarthritis, unspecified site: Secondary | ICD-10-CM | POA: Diagnosis not present

## 2015-10-04 DIAGNOSIS — E785 Hyperlipidemia, unspecified: Secondary | ICD-10-CM | POA: Diagnosis not present

## 2015-10-04 DIAGNOSIS — E08621 Diabetes mellitus due to underlying condition with foot ulcer: Secondary | ICD-10-CM | POA: Diagnosis not present

## 2015-10-04 DIAGNOSIS — R011 Cardiac murmur, unspecified: Secondary | ICD-10-CM | POA: Diagnosis not present

## 2015-10-04 DIAGNOSIS — G709 Myoneural disorder, unspecified: Secondary | ICD-10-CM | POA: Diagnosis not present

## 2015-10-04 DIAGNOSIS — Z87891 Personal history of nicotine dependence: Secondary | ICD-10-CM | POA: Diagnosis not present

## 2015-10-04 DIAGNOSIS — Z85828 Personal history of other malignant neoplasm of skin: Secondary | ICD-10-CM | POA: Diagnosis not present

## 2015-10-04 DIAGNOSIS — I10 Essential (primary) hypertension: Secondary | ICD-10-CM | POA: Diagnosis not present

## 2015-10-04 DIAGNOSIS — L97529 Non-pressure chronic ulcer of other part of left foot with unspecified severity: Secondary | ICD-10-CM | POA: Diagnosis not present

## 2015-10-04 DIAGNOSIS — E1142 Type 2 diabetes mellitus with diabetic polyneuropathy: Secondary | ICD-10-CM | POA: Diagnosis not present

## 2015-10-04 DIAGNOSIS — L97519 Non-pressure chronic ulcer of other part of right foot with unspecified severity: Secondary | ICD-10-CM | POA: Diagnosis not present

## 2015-10-04 DIAGNOSIS — E11610 Type 2 diabetes mellitus with diabetic neuropathic arthropathy: Secondary | ICD-10-CM | POA: Diagnosis not present

## 2015-10-05 ENCOUNTER — Encounter: Payer: Self-pay | Admitting: Adult Health

## 2015-10-05 ENCOUNTER — Ambulatory Visit (INDEPENDENT_AMBULATORY_CARE_PROVIDER_SITE_OTHER): Payer: Medicare Other | Admitting: Adult Health

## 2015-10-05 VITALS — BP 134/62 | Temp 98.1°F | Wt 223.2 lb

## 2015-10-05 DIAGNOSIS — R109 Unspecified abdominal pain: Secondary | ICD-10-CM | POA: Diagnosis not present

## 2015-10-05 DIAGNOSIS — E11621 Type 2 diabetes mellitus with foot ulcer: Secondary | ICD-10-CM | POA: Diagnosis not present

## 2015-10-05 DIAGNOSIS — L97509 Non-pressure chronic ulcer of other part of unspecified foot with unspecified severity: Secondary | ICD-10-CM | POA: Diagnosis not present

## 2015-10-05 DIAGNOSIS — E1165 Type 2 diabetes mellitus with hyperglycemia: Secondary | ICD-10-CM | POA: Diagnosis not present

## 2015-10-05 DIAGNOSIS — IMO0002 Reserved for concepts with insufficient information to code with codable children: Secondary | ICD-10-CM

## 2015-10-05 LAB — POCT GLYCOSYLATED HEMOGLOBIN (HGB A1C): Hemoglobin A1C: 5.6

## 2015-10-05 NOTE — Progress Notes (Signed)
Subjective:    Patient ID: [name], male    DOB: 1941-01-17, 75 y.o.   MRN: [id number]  HPI  75 year old male, patient of Dr. Sherren Mocha, who I am seeing today after he was seen in the ER for the complaint of left sided rib pain. Per ER note:   Patient states that he was backing up and turned suddenly causing him to lose his balance, he fell over and hit his left ribs on the corner of his dresser. He reports mild pain with palpation and movement, but is otherwise pain-free at rest. He states that he took some Tylenol for his symptoms with good relief. He is not anticoagulated. He denies any chest pain or shortness of breath. Denies any head injury or loss of consciousness. Denies any numbness, weakness, or tingling. Patient also sustained a mild abrasion/skin tear to his left wrist. There are no other associated symptoms.  Chest Xray was negative.   Today in the office he reports that " last night was the first good night of sleep I have had. I am starting to feel better. He taking Tramadol sparingly.   He also has a complaint of increasing blood sugar over the last year. He reports that when he was taking dual therpay with metformin and glipizide his blood sugars were in the 80-90's. He was taken off glipizide and this blood sugars have been steadily climbing up to the one teens. He does report that he is not exercising due to having to help care for his sick wife.. His last A1c was 5.8 in February   Review of Systems  Constitutional: Negative.   HENT: Negative.   Respiratory: Negative.   Cardiovascular: Negative.   Skin: Positive for color change and wound.  Neurological: Negative.    Past Medical History  Diagnosis Date  . ED (erectile dysfunction)   . Rhinitis   . Hypertension   . Osteoarthritis   . CAD (coronary artery disease)     Minimal plaque cath 2008  . Rheumatic fever   . Diabetes mellitus   . Recurrent kidney stones     Social History   Social History  . Marital  Status: Married    Spouse Name: N/A  . Number of Children: 1  . Years of Education: N/A   Occupational History  . Retired    Social History Main Topics  . Smoking status: Former Smoker    Quit date: 07/26/1981  . Smokeless tobacco: Never Used  . Alcohol Use: 1.8 oz/week    3 Cans of beer per week     Comment: Occassionally  . Drug Use: No  . Sexual Activity: No   Other Topics Concern  . Not on file   Social History Narrative   Lives with wife.    Past Surgical History  Procedure Laterality Date  . Inguinal hernia repair    . Colonoscopy  2006, 2010    Family History  Problem Relation Age of Onset  . Stroke Father 1    Died age 61  . Coronary artery disease Brother 49  . Colon cancer Neg Hx   . Colon polyps Neg Hx   . Diabetes Neg Hx   . Esophageal cancer Neg Hx   . Kidney disease Neg Hx   . Gallbladder disease Neg Hx     No Known Allergies  Current Outpatient Prescriptions on File Prior to Visit  Medication Sig Dispense Refill  . ACCU-CHEK AVIVA PLUS test strip USE  TO TEST DAILY AS INSTRUCTED PER MD**EMERGENCY FILL MEDICAL REASON** 100 each 3  . aspirin 81 MG tablet Take 81 mg by mouth as needed for pain (pain).     Marland Kitchen atenolol-chlorthalidone (TENORETIC) 50-25 MG tablet TAKE ONE TABLET BY MOUTH ONE TIME DAILY (Patient taking differently: t) 90 tablet 1  . halobetasol (ULTRAVATE) 0.05 % cream Apply topically 2 (two) times daily. to affected area (Patient taking differently: Apply topically as needed. to affected area) 50 g 0  . Lancets (ACCU-CHEK MULTICLIX) lancets USE TO TEST ONCE DAILY  4  . loratadine (CLARITIN) 10 MG tablet Take 10 mg by mouth daily.    Marland Kitchen LORazepam (ATIVAN) 0.5 MG tablet Take 1 tablet (0.5 mg total) by mouth 2 (two) times daily as needed for anxiety. 60 tablet 5  . metFORMIN (GLUCOPHAGE) 1000 MG tablet TAKE ONE TABLET BY MOUTH TWICE DAILY 180 tablet 3  . Multiple Vitamin (MULTIVITAMIN) tablet Take 1 tablet by mouth daily.    . mupirocin  ointment (BACTROBAN) 2 % APPLY TO AFFECTED AREA TWICE DAILY  0  . potassium chloride SA (K-DUR,KLOR-CON) 20 MEQ tablet 2 tabs daily in the morning 270 tablet 1  . traMADol (ULTRAM) 50 MG tablet Half to one tab twice daily as needed for back pain 30 tablet 5  . TURMERIC PO Take 1 tablet by mouth daily.    . citalopram (CELEXA) 10 MG tablet Take 1 tablet (10 mg total) by mouth daily. (Patient not taking: Reported on 10/05/2015) 100 tablet 3   No current facility-administered medications on file prior to visit.    BP 134/62 mmHg  Temp(Src) 98.1 F (36.7 C) (Oral)  Wt 223 lb 3.2 oz (101.243 kg)       Objective:   Physical Exam  Constitutional: He is oriented to person, place, and time. He appears well-developed and well-nourished. No distress.  Cardiovascular: Normal rate, regular rhythm, normal heart sounds and intact distal pulses.  Exam reveals no gallop and no friction rub.   No murmur heard. Pulmonary/Chest: Effort normal and breath sounds normal. No respiratory distress. He has no wheezes. He has no rales. He exhibits no tenderness.  Musculoskeletal: He exhibits tenderness (left flank ).  Bruising noted on left flank around ribs 5 and 6. Bruising in various stages of healing. Has tenderness with palpation. No crepitus   Neurological: He is alert and oriented to person, place, and time.  Skin: Skin is warm and dry. No rash noted. He is not diaphoretic. No erythema. No pallor.  Well healing skin tear on left forearm   Psychiatric: He has a normal mood and affect. His behavior is normal. Judgment and thought content normal.  Vitals reviewed.     Assessment & Plan:  1. Left flank pain - Continue with pain medication as needed - Add heat or ice, which ever feels better - I would like him to do deep breathing exercises - Stay active.   2. Uncontrolled type 2 diabetes mellitus with foot ulcer, without long-term current use of insulin (HCC) - POC HgB A1c - 5.6 - No change in  medication management - Work on diet and increasing exercise  Dorothyann Peng, NP

## 2015-10-13 DIAGNOSIS — M25562 Pain in left knee: Secondary | ICD-10-CM | POA: Diagnosis not present

## 2015-10-23 ENCOUNTER — Other Ambulatory Visit: Payer: Self-pay | Admitting: Family Medicine

## 2015-11-09 DIAGNOSIS — E119 Type 2 diabetes mellitus without complications: Secondary | ICD-10-CM | POA: Diagnosis not present

## 2015-11-09 DIAGNOSIS — Z0100 Encounter for examination of eyes and vision without abnormal findings: Secondary | ICD-10-CM | POA: Diagnosis not present

## 2015-11-09 DIAGNOSIS — H2513 Age-related nuclear cataract, bilateral: Secondary | ICD-10-CM | POA: Diagnosis not present

## 2015-11-10 DIAGNOSIS — L97411 Non-pressure chronic ulcer of right heel and midfoot limited to breakdown of skin: Secondary | ICD-10-CM | POA: Diagnosis not present

## 2015-11-10 DIAGNOSIS — E11610 Type 2 diabetes mellitus with diabetic neuropathic arthropathy: Secondary | ICD-10-CM | POA: Diagnosis not present

## 2015-11-10 DIAGNOSIS — L97421 Non-pressure chronic ulcer of left heel and midfoot limited to breakdown of skin: Secondary | ICD-10-CM | POA: Diagnosis not present

## 2015-11-21 ENCOUNTER — Other Ambulatory Visit: Payer: Self-pay | Admitting: Family Medicine

## 2015-12-01 DIAGNOSIS — L570 Actinic keratosis: Secondary | ICD-10-CM | POA: Diagnosis not present

## 2015-12-01 DIAGNOSIS — Z85828 Personal history of other malignant neoplasm of skin: Secondary | ICD-10-CM | POA: Diagnosis not present

## 2015-12-01 DIAGNOSIS — D692 Other nonthrombocytopenic purpura: Secondary | ICD-10-CM | POA: Diagnosis not present

## 2015-12-01 DIAGNOSIS — L578 Other skin changes due to chronic exposure to nonionizing radiation: Secondary | ICD-10-CM | POA: Diagnosis not present

## 2015-12-06 ENCOUNTER — Telehealth: Payer: Self-pay | Admitting: Family Medicine

## 2015-12-06 MED ORDER — TRAMADOL HCL 50 MG PO TABS: ORAL_TABLET | ORAL | Status: DC

## 2015-12-06 NOTE — Telephone Encounter (Signed)
Rx called in to pharmacy. 

## 2015-12-06 NOTE — Telephone Encounter (Signed)
Pt need new Rx for Tramadol   Pharm:  [facility]

## 2015-12-10 ENCOUNTER — Emergency Department (HOSPITAL_COMMUNITY): Payer: Medicare Other

## 2015-12-10 ENCOUNTER — Encounter (HOSPITAL_COMMUNITY): Payer: Self-pay | Admitting: Emergency Medicine

## 2015-12-10 ENCOUNTER — Inpatient Hospital Stay (HOSPITAL_COMMUNITY): Payer: Medicare Other

## 2015-12-10 ENCOUNTER — Inpatient Hospital Stay (HOSPITAL_COMMUNITY)
Admission: EM | Admit: 2015-12-10 | Discharge: 2015-12-12 | DRG: 872 | Disposition: A | Discharge status: Home / Self Care | Payer: Medicare Other | Attending: Internal Medicine | Admitting: Internal Medicine

## 2015-12-10 DIAGNOSIS — R531 Weakness: Secondary | ICD-10-CM | POA: Diagnosis not present

## 2015-12-10 DIAGNOSIS — E08621 Diabetes mellitus due to underlying condition with foot ulcer: Secondary | ICD-10-CM | POA: Diagnosis not present

## 2015-12-10 DIAGNOSIS — L97911 Non-pressure chronic ulcer of unspecified part of right lower leg limited to breakdown of skin: Secondary | ICD-10-CM | POA: Diagnosis not present

## 2015-12-10 DIAGNOSIS — F329 Major depressive disorder, single episode, unspecified: Secondary | ICD-10-CM | POA: Diagnosis present

## 2015-12-10 DIAGNOSIS — E119 Type 2 diabetes mellitus without complications: Secondary | ICD-10-CM | POA: Diagnosis not present

## 2015-12-10 DIAGNOSIS — R05 Cough: Secondary | ICD-10-CM | POA: Diagnosis not present

## 2015-12-10 DIAGNOSIS — I493 Ventricular premature depolarization: Secondary | ICD-10-CM | POA: Diagnosis present

## 2015-12-10 DIAGNOSIS — E876 Hypokalemia: Secondary | ICD-10-CM | POA: Diagnosis present

## 2015-12-10 DIAGNOSIS — L03116 Cellulitis of left lower limb: Secondary | ICD-10-CM

## 2015-12-10 DIAGNOSIS — Z87891 Personal history of nicotine dependence: Secondary | ICD-10-CM

## 2015-12-10 DIAGNOSIS — R002 Palpitations: Secondary | ICD-10-CM | POA: Diagnosis not present

## 2015-12-10 DIAGNOSIS — Z7984 Long term (current) use of oral hypoglycemic drugs: Secondary | ICD-10-CM

## 2015-12-10 DIAGNOSIS — I891 Lymphangitis: Secondary | ICD-10-CM

## 2015-12-10 DIAGNOSIS — L97509 Non-pressure chronic ulcer of other part of unspecified foot with unspecified severity: Secondary | ICD-10-CM

## 2015-12-10 DIAGNOSIS — I2510 Atherosclerotic heart disease of native coronary artery without angina pectoris: Secondary | ICD-10-CM | POA: Diagnosis present

## 2015-12-10 DIAGNOSIS — L97421 Non-pressure chronic ulcer of left heel and midfoot limited to breakdown of skin: Secondary | ICD-10-CM | POA: Diagnosis not present

## 2015-12-10 DIAGNOSIS — R000 Tachycardia, unspecified: Secondary | ICD-10-CM | POA: Diagnosis not present

## 2015-12-10 DIAGNOSIS — E872 Acidosis: Secondary | ICD-10-CM | POA: Diagnosis present

## 2015-12-10 DIAGNOSIS — R509 Fever, unspecified: Secondary | ICD-10-CM

## 2015-12-10 DIAGNOSIS — L97921 Non-pressure chronic ulcer of unspecified part of left lower leg limited to breakdown of skin: Secondary | ICD-10-CM | POA: Diagnosis not present

## 2015-12-10 DIAGNOSIS — I10 Essential (primary) hypertension: Secondary | ICD-10-CM | POA: Diagnosis present

## 2015-12-10 DIAGNOSIS — F411 Generalized anxiety disorder: Secondary | ICD-10-CM | POA: Diagnosis present

## 2015-12-10 DIAGNOSIS — Z823 Family history of stroke: Secondary | ICD-10-CM

## 2015-12-10 DIAGNOSIS — Z79899 Other long term (current) drug therapy: Secondary | ICD-10-CM | POA: Diagnosis not present

## 2015-12-10 DIAGNOSIS — M19072 Primary osteoarthritis, left ankle and foot: Secondary | ICD-10-CM | POA: Diagnosis not present

## 2015-12-10 DIAGNOSIS — E11621 Type 2 diabetes mellitus with foot ulcer: Secondary | ICD-10-CM | POA: Diagnosis present

## 2015-12-10 DIAGNOSIS — L97529 Non-pressure chronic ulcer of other part of left foot with unspecified severity: Secondary | ICD-10-CM | POA: Diagnosis present

## 2015-12-10 DIAGNOSIS — I472 Ventricular tachycardia: Secondary | ICD-10-CM | POA: Diagnosis not present

## 2015-12-10 DIAGNOSIS — Z8249 Family history of ischemic heart disease and other diseases of the circulatory system: Secondary | ICD-10-CM

## 2015-12-10 DIAGNOSIS — A419 Sepsis, unspecified organism: Secondary | ICD-10-CM | POA: Diagnosis not present

## 2015-12-10 DIAGNOSIS — L97519 Non-pressure chronic ulcer of other part of right foot with unspecified severity: Secondary | ICD-10-CM | POA: Diagnosis present

## 2015-12-10 DIAGNOSIS — R45851 Suicidal ideations: Secondary | ICD-10-CM | POA: Diagnosis present

## 2015-12-10 DIAGNOSIS — Z7982 Long term (current) use of aspirin: Secondary | ICD-10-CM

## 2015-12-10 DIAGNOSIS — L0390 Cellulitis, unspecified: Secondary | ICD-10-CM

## 2015-12-10 DIAGNOSIS — E11610 Type 2 diabetes mellitus with diabetic neuropathic arthropathy: Secondary | ICD-10-CM | POA: Diagnosis not present

## 2015-12-10 LAB — GLUCOSE, CAPILLARY
Glucose-Capillary: 123 mg/dL — ABNORMAL HIGH (ref 65–99)
Glucose-Capillary: 124 mg/dL — ABNORMAL HIGH (ref 65–99)

## 2015-12-10 LAB — PROTIME-INR
INR: 1.18 (ref 0.00–1.49)
Prothrombin Time: 14.7 seconds (ref 11.6–15.2)

## 2015-12-10 LAB — URINALYSIS, ROUTINE W REFLEX MICROSCOPIC
Bilirubin Urine: NEGATIVE
Glucose, UA: NEGATIVE mg/dL
Ketones, ur: NEGATIVE mg/dL
Leukocytes, UA: NEGATIVE
Nitrite: NEGATIVE
Protein, ur: NEGATIVE mg/dL
Specific Gravity, Urine: 1.018 (ref 1.005–1.030)
pH: 5.5 (ref 5.0–8.0)

## 2015-12-10 LAB — I-STAT CHEM 8, ED
BUN: 12 mg/dL (ref 6–20)
Calcium, Ion: 1.12 mmol/L (ref 1.12–1.23)
Chloride: 100 mmol/L — ABNORMAL LOW (ref 101–111)
Creatinine, Ser: 0.5 mg/dL — ABNORMAL LOW (ref 0.61–1.24)
Glucose, Bld: 139 mg/dL — ABNORMAL HIGH (ref 65–99)
HCT: 40 % (ref 39.0–52.0)
Hemoglobin: 13.6 g/dL (ref 13.0–17.0)
Potassium: 3.8 mmol/L (ref 3.5–5.1)
Sodium: 138 mmol/L (ref 135–145)
TCO2: 25 mmol/L (ref 0–100)

## 2015-12-10 LAB — CBC WITH DIFFERENTIAL/PLATELET
Basophils Absolute: 0.1 10*3/uL (ref 0.0–0.1)
Basophils Relative: 1 %
Eosinophils Absolute: 0.2 10*3/uL (ref 0.0–0.7)
Eosinophils Relative: 2 %
HCT: 38.6 % — ABNORMAL LOW (ref 39.0–52.0)
Hemoglobin: 13.1 g/dL (ref 13.0–17.0)
Lymphocytes Relative: 6 %
Lymphs Abs: 0.6 10*3/uL — ABNORMAL LOW (ref 0.7–4.0)
MCH: 31.3 pg (ref 26.0–34.0)
MCHC: 33.9 g/dL (ref 30.0–36.0)
MCV: 92.3 fL (ref 78.0–100.0)
Monocytes Absolute: 0.9 10*3/uL (ref 0.1–1.0)
Monocytes Relative: 8 %
Neutro Abs: 9.4 10*3/uL — ABNORMAL HIGH (ref 1.7–7.7)
Neutrophils Relative %: 83 %
Platelets: 188 10*3/uL (ref 150–400)
RBC: 4.18 MIL/uL — ABNORMAL LOW (ref 4.22–5.81)
RDW: 13.2 % (ref 11.5–15.5)
WBC: 11.2 10*3/uL — ABNORMAL HIGH (ref 4.0–10.5)

## 2015-12-10 LAB — LACTIC ACID, PLASMA
Lactic Acid, Venous: 3.1 mmol/L (ref 0.5–1.9)
Lactic Acid, Venous: 1.5 mmol/L (ref 0.5–1.9)

## 2015-12-10 LAB — MRSA PCR SCREENING: MRSA by PCR: NEGATIVE

## 2015-12-10 LAB — BASIC METABOLIC PANEL
Anion gap: 12 (ref 5–15)
BUN: 14 mg/dL (ref 6–20)
CO2: 24 mmol/L (ref 22–32)
Calcium: 9 mg/dL (ref 8.9–10.3)
Chloride: 103 mmol/L (ref 101–111)
Creatinine, Ser: 0.61 mg/dL (ref 0.61–1.24)
GFR calc Af Amer: >60 mL/min (ref >60)
GFR calc non Af Amer: >60 mL/min (ref >60)
Glucose, Bld: 140 mg/dL — ABNORMAL HIGH (ref 65–99)
Potassium: 3.7 mmol/L (ref 3.5–5.1)
Sodium: 139 mmol/L (ref 135–145)

## 2015-12-10 LAB — I-STAT CG4 LACTIC ACID, ED: Lactic Acid, Venous: 3.11 mmol/L (ref 0.5–1.9)

## 2015-12-10 LAB — PROCALCITONIN: Procalcitonin: <0.1 ng/mL

## 2015-12-10 LAB — URINE MICROSCOPIC-ADD ON
Squamous Epithelial / LPF: NONE SEEN
WBC, UA: NONE SEEN WBC/hpf (ref 0–5)

## 2015-12-10 LAB — C-REACTIVE PROTEIN: CRP: 0.8 mg/dL (ref <1.0)

## 2015-12-10 LAB — SEDIMENTATION RATE: Sed Rate: 25 mm/hr — ABNORMAL HIGH (ref 0–16)

## 2015-12-10 LAB — APTT: aPTT: 33 seconds (ref 24–37)

## 2015-12-10 MED ORDER — LORAZEPAM 0.5 MG PO TABS
0.5000 mg | ORAL_TABLET | Freq: Two times a day (BID) | ORAL | Status: DC | PRN
  Administered 2015-12-10: 0.5 mg via ORAL
  Filled 2015-12-10: qty 1

## 2015-12-10 MED ORDER — ASPIRIN EC 81 MG PO TBEC: 162.0000 mg | DELAYED_RELEASE_TABLET | Freq: Every day | ORAL | Status: DC | PRN

## 2015-12-10 MED ORDER — METOPROLOL TARTRATE 25 MG PO TABS
12.5000 mg | ORAL_TABLET | Freq: Two times a day (BID) | ORAL | Status: DC
  Administered 2015-12-11 – 2015-12-12 (×2): 12.5 mg via ORAL
  Filled 2015-12-10 (×3): qty 1

## 2015-12-10 MED ORDER — SODIUM CHLORIDE 0.9 % IV SOLN
INTRAVENOUS | Status: DC
  Administered 2015-12-10: 75 mL/h via INTRAVENOUS
  Administered 2015-12-10 – 2015-12-12 (×3): via INTRAVENOUS

## 2015-12-10 MED ORDER — SODIUM CHLORIDE 0.9 % IV BOLUS (SEPSIS)
1000.0000 mL | Freq: Once | INTRAVENOUS | Status: AC
  Administered 2015-12-10: 1000 mL via INTRAVENOUS

## 2015-12-10 MED ORDER — ACETAMINOPHEN 325 MG PO TABS
650.0000 mg | ORAL_TABLET | Freq: Once | ORAL | Status: AC
  Administered 2015-12-10: 650 mg via ORAL
  Filled 2015-12-10: qty 2

## 2015-12-10 MED ORDER — ACETAMINOPHEN 325 MG PO TABS
650.0000 mg | ORAL_TABLET | Freq: Four times a day (QID) | ORAL | Status: DC | PRN
  Administered 2015-12-10 – 2015-12-11 (×2): 650 mg via ORAL
  Filled 2015-12-10 (×2): qty 2

## 2015-12-10 MED ORDER — POTASSIUM CHLORIDE CRYS ER 20 MEQ PO TBCR
20.0000 meq | EXTENDED_RELEASE_TABLET | Freq: Once | ORAL | Status: AC
  Administered 2015-12-10: 20 meq via ORAL
  Filled 2015-12-10: qty 1

## 2015-12-10 MED ORDER — PIPERACILLIN-TAZOBACTAM 3.375 G IVPB
3.3750 g | Freq: Once | INTRAVENOUS | Status: AC
  Administered 2015-12-10: 3.375 g via INTRAVENOUS
  Filled 2015-12-10: qty 50

## 2015-12-10 MED ORDER — PIPERACILLIN-TAZOBACTAM 3.375 G IVPB
3.3750 g | Freq: Three times a day (TID) | INTRAVENOUS | Status: DC
  Administered 2015-12-10 – 2015-12-12 (×6): 3.375 g via INTRAVENOUS
  Filled 2015-12-10 (×6): qty 50

## 2015-12-10 MED ORDER — HALOBETASOL PROPIONATE 0.05 % EX CREA: 1.0000 "application " | TOPICAL_CREAM | Freq: Two times a day (BID) | CUTANEOUS | Status: DC | PRN

## 2015-12-10 MED ORDER — MUPIROCIN 2 % EX OINT
TOPICAL_OINTMENT | Freq: Two times a day (BID) | CUTANEOUS | Status: DC
  Administered 2015-12-11 – 2015-12-12 (×2): via TOPICAL
  Filled 2015-12-10: qty 22

## 2015-12-10 MED ORDER — TRAMADOL HCL 50 MG PO TABS
25.0000 mg | ORAL_TABLET | Freq: Two times a day (BID) | ORAL | Status: DC | PRN
  Administered 2015-12-10: 25 mg via ORAL
  Filled 2015-12-10: qty 1

## 2015-12-10 MED ORDER — LORAZEPAM 2 MG/ML IJ SOLN
0.5000 mg | Freq: Once | INTRAMUSCULAR | Status: AC
  Administered 2015-12-10: 0.5 mg via INTRAVENOUS
  Filled 2015-12-10: qty 1

## 2015-12-10 MED ORDER — SODIUM CHLORIDE 0.9% FLUSH
3.0000 mL | Freq: Two times a day (BID) | INTRAVENOUS | Status: DC
  Administered 2015-12-11: 3 mL via INTRAVENOUS

## 2015-12-10 MED ORDER — SODIUM CHLORIDE 0.9 % IV BOLUS (SEPSIS)
1800.0000 mL | Freq: Once | INTRAVENOUS | Status: AC
  Administered 2015-12-10: 1800 mL via INTRAVENOUS

## 2015-12-10 MED ORDER — INSULIN ASPART 100 UNIT/ML ~~LOC~~ SOLN
0.0000 [IU] | Freq: Three times a day (TID) | SUBCUTANEOUS | Status: DC
  Administered 2015-12-10 – 2015-12-12 (×4): 1 [IU] via SUBCUTANEOUS

## 2015-12-10 MED ORDER — VANCOMYCIN HCL 10 G IV SOLR
1250.0000 mg | Freq: Two times a day (BID) | INTRAVENOUS | Status: DC
  Administered 2015-12-10 – 2015-12-12 (×5): 1250 mg via INTRAVENOUS
  Filled 2015-12-10 (×5): qty 1250

## 2015-12-10 MED ORDER — ENOXAPARIN SODIUM 40 MG/0.4ML ~~LOC~~ SOLN
40.0000 mg | SUBCUTANEOUS | Status: DC
  Administered 2015-12-10 – 2015-12-11 (×2): 40 mg via SUBCUTANEOUS
  Filled 2015-12-10 (×2): qty 0.4

## 2015-12-10 MED ORDER — MAGNESIUM SULFATE 2 GM/50ML IV SOLN
2.0000 g | Freq: Once | INTRAVENOUS | Status: AC
  Administered 2015-12-10: 2 g via INTRAVENOUS
  Filled 2015-12-10: qty 50

## 2015-12-10 MED ORDER — ADULT MULTIVITAMIN W/MINERALS CH
1.0000 | ORAL_TABLET | Freq: Every day | ORAL | Status: DC
  Administered 2015-12-11 – 2015-12-12 (×2): 1 via ORAL
  Filled 2015-12-10 (×3): qty 1

## 2015-12-10 NOTE — ED Notes (Signed)
Bed: WA17 Expected date:  Expected time:  Means of arrival:  Comments: 75 yo- fever, chills, cough

## 2015-12-10 NOTE — ED Provider Notes (Signed)
CSN: [id number]     Arrival date & time 12/10/15  [id number] History   First MD Initiated Contact with Patient 12/10/15 0845     Chief Complaint  Patient presents with  . Fatigue  . Fever  . Shoulder Pain  . Cough      HPI  Patient is a 75 year old male with a chief complaint of shakes and tremors.  History of NIDDM2, htn, foot ulcers followed with Dr. Sharol Given, anxiety.  Describes being in his normal state of health until yesterday. He admits that he has become fatigued secondary to caring for his wife who is incapacitated and bedridden with Parkinson's. He does get some help from a daily caregiver for 3 hours, and family including his daughter. Daughter accompanies him here. He states that last night he just didn't feel well and he cannot describe any particular symptoms. He waking this morning with shakes and tremors and weakness. Afebrile initially, my exam he is 103.1.  Does describe some pain in his left shoulder. Not pleuritic. States he feels slightly short of breath. Slight cough today. No shortness of breath or cough over the last 2 days. No urinary symptoms. Good appetite. No nausea vomiting. No headache sore throat or other complaints.  Is complaining of some pain in his left foot. He has some bandages over diabetic ulcers in both feet.  Past Medical History  Diagnosis Date  . ED (erectile dysfunction)   . Rhinitis   . Hypertension   . Osteoarthritis   . CAD (coronary artery disease)     Minimal plaque cath 2008  . Rheumatic fever   . Diabetes mellitus   . Recurrent kidney stones    Past Surgical History  Procedure Laterality Date  . Inguinal hernia repair    . Colonoscopy  2006, 2010   Family History  Problem Relation Age of Onset  . Stroke Father 47    Died age 22  . Coronary artery disease Brother 48  . Colon cancer Neg Hx   . Colon polyps Neg Hx   . Diabetes Neg Hx   . Esophageal cancer Neg Hx   . Kidney disease Neg Hx   . Gallbladder disease Neg Hx    Social  History  Substance Use Topics  . Smoking status: Former Smoker    Quit date: 07/26/1981  . Smokeless tobacco: Never Used  . Alcohol Use: 1.8 oz/week    3 Cans of beer per week     Comment: Occassionally    Review of Systems  Constitutional: Positive for fever and chills. Negative for diaphoresis, appetite change and fatigue.  HENT: Negative for mouth sores, sore throat and trouble swallowing.   Eyes: Negative for visual disturbance.  Respiratory: Positive for cough. Negative for chest tightness, shortness of breath and wheezing.   Cardiovascular: Negative for chest pain.  Gastrointestinal: Negative for nausea, vomiting, abdominal pain, diarrhea and abdominal distention.  Endocrine: Negative for polydipsia, polyphagia and polyuria.  Genitourinary: Negative for dysuria, frequency and hematuria.  Musculoskeletal: Negative for gait problem.  Skin: Positive for color change and rash. Negative for pallor.       Ulcers both feet  Neurological: Negative for dizziness, syncope, light-headedness and headaches.  Hematological: Does not bruise/bleed easily.  Psychiatric/Behavioral: Negative for behavioral problems and confusion.      Allergies  Review of patient's allergies indicates no known allergies.  Home Medications   Prior to Admission medications   Medication Sig Start Date End Date Taking? Authorizing Provider  aspirin 81  MG tablet Take 162 mg by mouth daily as needed for pain (pain).    Yes Historical Provider, MD  loratadine (CLARITIN) 10 MG tablet Take 10 mg by mouth daily as needed for allergies.    Yes Historical Provider, MD  LORazepam (ATIVAN) 0.5 MG tablet Take 1 tablet (0.5 mg total) by mouth 2 (two) times daily as needed for anxiety. 01/28/15  Yes Dorena Cookey, MD  metFORMIN (GLUCOPHAGE) 1000 MG tablet TAKE ONE TABLET BY MOUTH TWICE DAILY 10/18/14  Yes Dorena Cookey, MD  Multiple Vitamin (MULTIVITAMIN) tablet Take 1 tablet by mouth daily.   Yes Historical Provider, MD    mupirocin ointment (BACTROBAN) 2 % APPLY TO AFFECTED AREA TWICE DAILY AS NEEDED FOR CUTS OR SCRAPS 12/30/14  Yes Historical Provider, MD  potassium chloride SA (K-DUR,KLOR-CON) 20 MEQ tablet 2 tabs daily in the morning 06/27/15  Yes Dorena Cookey, MD  traMADol (ULTRAM) 50 MG tablet Half to one tab twice daily as needed for back pain Patient taking differently: Take 25-50 mg by mouth 2 (two) times daily as needed for moderate pain. Half to one tab twice daily as needed for back pain 12/06/15  Yes Dorena Cookey, MD  TURMERIC PO Take 1 tablet by mouth daily as needed (FOR LEG CRAMPS).    Yes Historical Provider, MD  ACCU-CHEK AVIVA PLUS test strip USE TO TEST DAILY AS INSTRUCTED PER MD**EMERGENCY FILL MEDICAL REASON** 07/01/15   Dorena Cookey, MD  atenolol-chlorthalidone (TENORETIC) 50-25 MG tablet TAKE ONE TABLET BY MOUTH ONE TIME DAILY Patient taking differently: TAKE ONE-HALF OF A TABLET ONCE DAILY 04/14/15   Dorena Cookey, MD  citalopram (CELEXA) 10 MG tablet Take 1 tablet (10 mg total) by mouth daily. Patient not taking: Reported on 10/05/2015 07/12/15   Dorena Cookey, MD  halobetasol (ULTRAVATE) 0.05 % cream Apply topically 2 (two) times daily. to affected area Patient taking differently: Apply 1 application topically 2 (two) times daily as needed (FOR RASHES). to affected area 09/21/14   Dorena Cookey, MD  Lancets (ACCU-CHEK MULTICLIX) lancets USE TO TEST ONCE DAILY 11/21/15   Dorena Cookey, MD   BP 141/97 mmHg  Pulse 133  Temp(Src) 99.9 F (37.7 C) (Oral)  Resp 28  Ht 6' (1.829 m)  Wt 213 lb 5 oz (96.758 kg)  BMI 28.92 kg/m2  SpO2 94% Physical Exam  Constitutional: He is oriented to person, place, and time. He appears well-developed and well-nourished. No distress.  HENT:  Head: Normocephalic.  Eyes: Conjunctivae are normal. Pupils are equal, round, and reactive to light. No scleral icterus.  Neck: Normal range of motion. Neck supple. No thyromegaly present.  Cardiovascular: Regular  rhythm.  Tachycardia present.  Exam reveals no gallop and no friction rub.   No murmur heard. Sinus tachycardia on the monitor.  Pulmonary/Chest: Effort normal and breath sounds normal. No respiratory distress. He has no wheezes. He has no rales.  Scattered rhonchi. Slightly diminished right basilar breath sounds. No increased work of  breathing.  Abdominal: Soft. Bowel sounds are normal. He exhibits no distension. There is no tenderness. There is no rebound.  Musculoskeletal: Normal range of motion.       Feet:  Neurological: He is alert and oriented to person, place, and time.  He is awake alert and mentating well.  Skin: Skin is warm and dry. No rash noted.  Psychiatric: He has a normal mood and affect. His behavior is normal.  He admits that he is  somewhat depressed about his wife's condition and he states his 2 point he realizes he has to consider placement for her. Is eating and sleeping well.    ED Course  Procedures (including critical care time) Labs Review Labs Reviewed  CBC WITH DIFFERENTIAL/PLATELET - Abnormal; Notable for the following:    WBC 11.2 (*)    RBC 4.18 (*)    HCT 38.6 (*)    Neutro Abs 9.4 (*)    Lymphs Abs 0.6 (*)    All other components within normal limits  BASIC METABOLIC PANEL - Abnormal; Notable for the following:    Glucose, Bld 140 (*)    All other components within normal limits  URINALYSIS, ROUTINE W REFLEX MICROSCOPIC (NOT AT [facility]) - Abnormal; Notable for the following:    Hgb urine dipstick SMALL (*)    All other components within normal limits  URINE MICROSCOPIC-ADD ON - Abnormal; Notable for the following:    Bacteria, UA RARE (*)    All other components within normal limits  I-STAT CG4 LACTIC ACID, ED - Abnormal; Notable for the following:    Lactic Acid, Venous 3.11 (*)    All other components within normal limits  I-STAT CHEM 8, ED - Abnormal; Notable for the following:    Chloride 100 (*)    Creatinine, Ser 0.50 (*)    Glucose, Bld  139 (*)    All other components within normal limits  CULTURE, BLOOD (ROUTINE X 2)  CULTURE, BLOOD (ROUTINE X 2)  URINE CULTURE    Imaging Review Dg Chest 2 View  12/10/2015  CLINICAL DATA:  Cough, weakness, left shoulder pain EXAM: CHEST  2 VIEW COMPARISON:  09/29/2015 FINDINGS: There is bilateral mild interstitial prominence and prominence of the central pulmonary vasculature. There is no focal parenchymal opacity. There is no pleural effusion or pneumothorax. The heart and mediastinal contours are stable. The osseous structures are unremarkable. IMPRESSION: Mild interstitial edema versus interstitial infection. Electronically Signed   By: Kathreen Devoid   On: 12/10/2015 10:45   I have personally reviewed and evaluated these images and lab results as part of my medical decision-making.   EKG Interpretation None      MDM   Final diagnoses:  Sepsis, due to unspecified organism [facility])  Cellulitis of left lower extremity  Lymphangitis  Fever, unspecified fever cause    Elevated lactate. Patient is septic. Mentating well. Not hypotensive. No indication for boluses. His being given 30 mL/kg fluid. After cultures obtained patient given vancomycin, Zosyn.  Given Tylenol. Will discuss disposition with hospitalist.    Tanna Furry, MD 12/10/15 [phone]

## 2015-12-10 NOTE — ED Notes (Signed)
Patient transported to X-ray 

## 2015-12-10 NOTE — ED Notes (Signed)
MRI called stating as soon as tech gets here they will be coming to get him.   Called [name] know that patient will not be up at 20 minute timer mark, he will be going to floor after MRI.

## 2015-12-10 NOTE — ED Notes (Signed)
Per EMS patient comes from home c/o fever, cough that is sometimes productive but unknown color of phlegm, shoulder pain and weakness x couple days. Patient took Tylenol and aspirin last night.   Vitals: Bp 156/90, HR 118, RR24, 95%, 100.8.

## 2015-12-10 NOTE — Progress Notes (Signed)
Pharmacy Antibiotic Note  [name] is a 75 y.o. male presented the ED on 12/10/15 from home with c/o fever and cough.  To start vancomycin for suspected PNA.  Plan: - vancomycin 1250 mg IV q12h - zosyn 3.375 gm IV x1 ordered by MD - f/u renal funct  ____________________  Height: 6' (182.9 cm) Weight: 213 lb 5 oz (96.758 kg) IBW/kg (Calculated) : 77.6  Temp (24hrs), Avg:99.9 F (37.7 C), Min:99.9 F (37.7 C), Max:99.9 F (37.7 C)   Recent Labs Lab 12/10/15 1005  WBC 11.2*    CrCl cannot be calculated (Patient has no serum creatinine result on file.).    No Known Allergies   Thank you for allowing pharmacy to be a part of this patient's care.  Lynelle Doctor 12/10/2015 10:31 AM

## 2015-12-10 NOTE — ED Notes (Signed)
MD at bedside. 

## 2015-12-10 NOTE — H&P (Signed)
History and Physical  [name] THY:[id number] DOB: Mar 13, 1941 DOA: 12/10/2015  Referring physician: EDP PCP: Joycelyn Man, MD   Chief Complaint:  Fever, diabetic foot ulcers  HPI: [name] is a 75 y.o. male   With NIDDM2, htn, foot ulcers followed with Dr. Sharol Given ( he reported he has an appointment with Dr Sharol Given on 7/18) presented to [facility] ED accompanied by his daughter. He states he did not feel well last night, with shakes and chills, he has bilateral foot ulcers, right foot with drainage, left foot with redness, he has peripheral neuropathy, does not feel a lot of pain. He reported intermittent chronic dry cough,no chest pain,nosob, no dizziness, no falls.  ED course: per EDP he is febirle 103, tachycardia into 130's, bp stable, labs wbc 11.2, lactic acid 3.11, ua no infection, blood culture in process, he is given ivf, vanc/zosyn, hospitalist called to admit the patient for sepsis due to diabetic foot ulcers. i have request foot imaging.  Review of Systems:  Detail per HPI, Review of systems are otherwise negative  Past Medical History  Diagnosis Date  . ED (erectile dysfunction)   . Rhinitis   . Hypertension   . Osteoarthritis   . CAD (coronary artery disease)     Minimal plaque cath 2008  . Rheumatic fever   . Diabetes mellitus   . Recurrent kidney stones    Past Surgical History  Procedure Laterality Date  . Inguinal hernia repair    . Colonoscopy  2006, 2010   Social History:  reports that he quit smoking about 34 years ago. He has never used smokeless tobacco. He reports that he drinks about 1.8 oz of alcohol per week. He reports that he does not use illicit drugs. Patient lives at home & is able to participate in activities of daily living independently, he walks with a cane, he is a main caregiver to his debilitated wife  No Known Allergies  Family History  Problem Relation Age of Onset  . Stroke Father 66    Died age 13  . Coronary artery disease  Brother 18  . Colon cancer Neg Hx   . Colon polyps Neg Hx   . Diabetes Neg Hx   . Esophageal cancer Neg Hx   . Kidney disease Neg Hx   . Gallbladder disease Neg Hx       Prior to Admission medications   Medication Sig Start Date End Date Taking? Authorizing Provider  aspirin 81 MG tablet Take 162 mg by mouth daily as needed for pain (pain).    Yes Historical Provider, MD  loratadine (CLARITIN) 10 MG tablet Take 10 mg by mouth daily as needed for allergies.    Yes Historical Provider, MD  LORazepam (ATIVAN) 0.5 MG tablet Take 1 tablet (0.5 mg total) by mouth 2 (two) times daily as needed for anxiety. 01/28/15  Yes Dorena Cookey, MD  metFORMIN (GLUCOPHAGE) 1000 MG tablet TAKE ONE TABLET BY MOUTH TWICE DAILY 10/18/14  Yes Dorena Cookey, MD  Multiple Vitamin (MULTIVITAMIN) tablet Take 1 tablet by mouth daily.   Yes Historical Provider, MD  mupirocin ointment (BACTROBAN) 2 % APPLY TO AFFECTED AREA TWICE DAILY AS NEEDED FOR CUTS OR SCRAPS 12/30/14  Yes Historical Provider, MD  potassium chloride SA (K-DUR,KLOR-CON) 20 MEQ tablet 2 tabs daily in the morning 06/27/15  Yes Dorena Cookey, MD  traMADol (ULTRAM) 50 MG tablet Half to one tab twice daily as needed for back pain Patient taking differently:  Take 25-50 mg by mouth 2 (two) times daily as needed for moderate pain. Half to one tab twice daily as needed for back pain 12/06/15  Yes Dorena Cookey, MD  TURMERIC PO Take 1 tablet by mouth daily as needed (FOR LEG CRAMPS).    Yes Historical Provider, MD  ACCU-CHEK AVIVA PLUS test strip USE TO TEST DAILY AS INSTRUCTED PER MD**EMERGENCY FILL MEDICAL REASON** 07/01/15   Dorena Cookey, MD  atenolol-chlorthalidone (TENORETIC) 50-25 MG tablet TAKE ONE TABLET BY MOUTH ONE TIME DAILY Patient taking differently: TAKE ONE-HALF OF A TABLET ONCE DAILY 04/14/15   Dorena Cookey, MD  citalopram (CELEXA) 10 MG tablet Take 1 tablet (10 mg total) by mouth daily. Patient not taking: Reported on 10/05/2015 07/12/15   Dorena Cookey, MD  halobetasol (ULTRAVATE) 0.05 % cream Apply topically 2 (two) times daily. to affected area Patient taking differently: Apply 1 application topically 2 (two) times daily as needed (FOR RASHES). to affected area 09/21/14   Dorena Cookey, MD  Lancets (ACCU-CHEK MULTICLIX) lancets USE TO TEST ONCE DAILY 11/21/15   Dorena Cookey, MD    Physical Exam: BP 147/70 mmHg  Pulse 113  Temp(Src) 99.9 F (37.7 C) (Oral)  Resp 22  Ht 6' (1.829 m)  Wt 96.758 kg (213 lb 5 oz)  BMI 28.92 kg/m2  SpO2 95%  General:  NAD, pleasant Eyes: PERRL ENT: unremarkable Neck: supple, no JVD Cardiovascular: tachycardia Respiratory: CTABL Abdomen: soft/ND/ND, positive bowel sounds Skin: no rash Musculoskeletal:   Psychiatric: calm/cooperative Neurologic: bilateral foot deformity, mid foot ulcers on medial bilateral planter surfaces, Right foot ulcer bulgy, with a small opening in the center, with scant clear drainage, no significant erythema. left foot ulcer with intact scab, no opening,but has significant erythema from forefoot to midfoot, well demarcated, with a red streak extending up to anterior left shin.          Labs on Admission:  Basic Metabolic Panel:  Recent Labs Lab 12/10/15 1005 12/10/15 1044  NA 139 138  K 3.7 3.8  CL 103 100*  CO2 24  --   GLUCOSE 140* 139*  BUN 14 12  CREATININE 0.61 0.50*  CALCIUM 9.0  --    Liver Function Tests: No results for input(s): AST, ALT, ALKPHOS, BILITOT, PROT, ALBUMIN in the last 168 hours. No results for input(s): LIPASE, AMYLASE in the last 168 hours. No results for input(s): AMMONIA in the last 168 hours. CBC:  Recent Labs Lab 12/10/15 1005 12/10/15 1044  WBC 11.2*  --   NEUTROABS 9.4*  --   HGB 13.1 13.6  HCT 38.6* 40.0  MCV 92.3  --   PLT 188  --    Cardiac Enzymes: No results for input(s): CKTOTAL, CKMB, CKMBINDEX, TROPONINI in the last 168 hours.  BNP (last 3 results) No results for input(s): BNP in the last 8760  hours.  ProBNP (last 3 results) No results for input(s): PROBNP in the last 8760 hours.  CBG: No results for input(s): GLUCAP in the last 168 hours.  Radiological Exams on Admission: Dg Chest 2 View  12/10/2015  CLINICAL DATA:  Cough, weakness, left shoulder pain EXAM: CHEST  2 VIEW COMPARISON:  09/29/2015 FINDINGS: There is bilateral mild interstitial prominence and prominence of the central pulmonary vasculature. There is no focal parenchymal opacity. There is no pleural effusion or pneumothorax. The heart and mediastinal contours are stable. The osseous structures are unremarkable. IMPRESSION: Mild interstitial edema versus interstitial infection. Electronically Signed  By: Kathreen Devoid   On: 12/10/2015 10:45    EKG: Independently reviewed. Pending,  On tele with very brief (less  Than half sec) tachycardia around 150 that suspect svt, does has many pac's, and pvc's on tele  Assessment/Plan Present on Admission:  . Sepsis ([place])   Sepsis presented on admission with tachycardia, fever, lactic acidosis, infection source likely diabetic foot ulcer. Blood culture in process, sepsis protocol utilized, on ivf/ abx. Hold diuretics.  Bilateral diabetic foot ulcers,left foot with erysipelis vs cellulitis: foot imaging pending, esr/crp pending, continue vanc/zosyn for now, orthopedic Dr Lorin Mercy consulted, will see patient in am.  noninsulin dependent diabetes, last a1c 5.6 in 09/2015,hold metformin, start ssi here.  Tachycardia, with frequent pvc's, pac's, suspect brief SVT, continue tele, EKG pending, keep k>4,mag >2, start low dose lopressor. bp stable.  H/o HTN; home med atenolol-chlorthalidone held,currently on low dose lopressor  DVT prophylaxis: lovenox  Consultants: orthopedics Dr Lorin Mercy  Code Status: full   Family Communication:  Patient and his daughter in room  Disposition Plan: admit to med tele  Time spent: 90mns  Shantera Monts MD, PhD [facility] Pager [phone]  7PM-7AM, please contact night-coverage at [email or web address], password [email or web address]

## 2015-12-10 NOTE — ED Notes (Signed)
X-ray at bedside

## 2015-12-10 NOTE — ED Notes (Signed)
Pt in MRI.

## 2015-12-11 ENCOUNTER — Inpatient Hospital Stay (HOSPITAL_COMMUNITY): Payer: Medicare Other

## 2015-12-11 DIAGNOSIS — F411 Generalized anxiety disorder: Secondary | ICD-10-CM

## 2015-12-11 DIAGNOSIS — R002 Palpitations: Secondary | ICD-10-CM

## 2015-12-11 DIAGNOSIS — I472 Ventricular tachycardia: Secondary | ICD-10-CM

## 2015-12-11 DIAGNOSIS — R45851 Suicidal ideations: Secondary | ICD-10-CM

## 2015-12-11 LAB — CBC
HCT: 32.6 % — ABNORMAL LOW (ref 39.0–52.0)
Hemoglobin: 11.2 g/dL — ABNORMAL LOW (ref 13.0–17.0)
MCH: 31.8 pg (ref 26.0–34.0)
MCHC: 34.4 g/dL (ref 30.0–36.0)
MCV: 92.6 fL (ref 78.0–100.0)
Platelets: 140 10*3/uL — ABNORMAL LOW (ref 150–400)
RBC: 3.52 MIL/uL — ABNORMAL LOW (ref 4.22–5.81)
RDW: 13.4 % (ref 11.5–15.5)
WBC: 7.3 10*3/uL (ref 4.0–10.5)

## 2015-12-11 LAB — ECHOCARDIOGRAM COMPLETE
Height: 72 in
Weight: 3566.16 oz

## 2015-12-11 LAB — COMPREHENSIVE METABOLIC PANEL
ALT: 12 U/L — ABNORMAL LOW (ref 17–63)
AST: 15 U/L (ref 15–41)
Albumin: 3.3 g/dL — ABNORMAL LOW (ref 3.5–5.0)
Alkaline Phosphatase: 53 U/L (ref 38–126)
Anion gap: 5 (ref 5–15)
BUN: 10 mg/dL (ref 6–20)
CO2: 27 mmol/L (ref 22–32)
Calcium: 7.6 mg/dL — ABNORMAL LOW (ref 8.9–10.3)
Chloride: 103 mmol/L (ref 101–111)
Creatinine, Ser: 0.59 mg/dL — ABNORMAL LOW (ref 0.61–1.24)
GFR calc Af Amer: >60 mL/min (ref >60)
GFR calc non Af Amer: >60 mL/min (ref >60)
Glucose, Bld: 152 mg/dL — ABNORMAL HIGH (ref 65–99)
Potassium: 3.2 mmol/L — ABNORMAL LOW (ref 3.5–5.1)
Sodium: 135 mmol/L (ref 135–145)
Total Bilirubin: 1.1 mg/dL (ref 0.3–1.2)
Total Protein: 5.8 g/dL — ABNORMAL LOW (ref 6.5–8.1)

## 2015-12-11 LAB — MAGNESIUM: Magnesium: 1.9 mg/dL (ref 1.7–2.4)

## 2015-12-11 LAB — GLUCOSE, CAPILLARY
Glucose-Capillary: 126 mg/dL — ABNORMAL HIGH (ref 65–99)
Glucose-Capillary: 136 mg/dL — ABNORMAL HIGH (ref 65–99)
Glucose-Capillary: 104 mg/dL — ABNORMAL HIGH (ref 65–99)
Glucose-Capillary: 136 mg/dL — ABNORMAL HIGH (ref 65–99)

## 2015-12-11 MED ORDER — POTASSIUM CHLORIDE CRYS ER 20 MEQ PO TBCR
40.0000 meq | EXTENDED_RELEASE_TABLET | ORAL | Status: AC
  Administered 2015-12-11 (×2): 40 meq via ORAL
  Filled 2015-12-11 (×2): qty 2

## 2015-12-11 NOTE — Progress Notes (Signed)
  Echocardiogram 2D Echocardiogram has been performed.  Diamond Nickel 12/11/2015, 2:27 PM

## 2015-12-11 NOTE — Progress Notes (Addendum)
PROGRESS NOTE  [name] JQZ:[id number] DOB: 05-25-1941 DOA: 12/10/2015 PCP: Joycelyn Man, MD  HPI/Recap of past 24 hours:  Patient was very anxious and expressed suicidal ideation last night, still seems angry this am, sitter in room tmax 100, denies foot pain, report bleeding from right foot ulcer  Assessment/Plan: Active Problems:   Sepsis ([place])   Sepsis presented on admission with tachycardia, fever, lactic acidosis, infection source likely diabetic foot ulcer. Blood culture in process, sepsis protocol utilized, on ivf/ abx. Hold diuretics. Continue ivf for another 24hrs.  Bilateral diabetic foot ulcers,left foot with erysipelis vs cellulitis: foot imaging no concern of osteomyelitis, esr 25,crp wnl, continue vanc/zosyn, orthopedic Dr Lorin Mercy consulted, will follow recommendation.  noninsulin dependent diabetes, last a1c 5.6 in 09/2015,hold metformin, start ssi here.  Hypokalemia: replace k, mag 1.9  Tachycardia,  with frequent pvc's, pac's, suspect brief SVT vs accelerated junctional tachycardia, there is also NSVT, patient denies chest pain/sob/dizziness. bp stable.  continue tele, keep k>4,mag >2, continue low dose lopressor. Titrate up if bp allows, echo ordered, consider cardiology consult if tachycardia persist while infection under control. Outpatient halter?  H/o HTN; home med atenolol-chlorthalidone held,currently on low dose lopressor  SI/labile mood /anxiety/depression: sitter in room, patient is under stress due to be the sole care giver to her bedridden wife, psych consulted  DVT prophylaxis: lovenox  Consultants:  orthopedics Dr Lorin Mercy psychiatry  Code Status: full   Family Communication: Patient and his daughter in room  Disposition Plan:likley home with home health in a few days   Procedures:  none  Antibiotics:  Vanc/zosyn from admission   Objective: BP 128/75 mmHg  Pulse 80  Temp(Src) 98 F (36.7 C) (Oral)  Resp 20  Ht 6'  (1.829 m)  Wt 101.1 kg (222 lb 14.2 oz)  BMI 30.22 kg/m2  SpO2 100%  Intake/Output Summary (Last 24 hours) at 12/11/15 0804 Last data filed at 12/11/15 0700  Gross per 24 hour  Intake   3405 ml  Output   1125 ml  Net   2280 ml   Filed Weights   12/10/15 0824 12/10/15 1700  Weight: 96.758 kg (213 lb 5 oz) 101.1 kg (222 lb 14.2 oz)    Exam:   General:  Angry,  Cardiovascular: RRR  Respiratory: less tachycardia  Abdomen: Soft/ND/NT, positive BS  Musculoskeletal: bilateral foot deformity, mid foot ulcers on medial bilateral planter surfaces, Right foot ulcer bulgy, with a small opening in the center, with scant clear drainage, no significant erythema. left foot ulcer with intact scab, no opening,but has significant erythema from forefoot to midfoot, well demarcated, with a red streak extending up to anterior shin and up to medial thigh  Neuro: aaox3  Data Reviewed: Basic Metabolic Panel:  Recent Labs Lab 12/10/15 1005 12/10/15 1044 12/11/15 0516  NA 139 138 135  K 3.7 3.8 3.2*  CL 103 100* 103  CO2 24  --  27  GLUCOSE 140* 139* 152*  BUN '14 12 10  ' CREATININE 0.61 0.50* 0.59*  CALCIUM 9.0  --  7.6*  MG  --   --  1.9   Liver Function Tests:  Recent Labs Lab 12/11/15 0516  AST 15  ALT 12*  ALKPHOS 53  BILITOT 1.1  PROT 5.8*  ALBUMIN 3.3*   No results for input(s): LIPASE, AMYLASE in the last 168 hours. No results for input(s): AMMONIA in the last 168 hours. CBC:  Recent Labs Lab 12/10/15 1005 12/10/15 1044 12/11/15 0516  WBC 11.2*  --  7.3  NEUTROABS 9.4*  --   --   HGB 13.1 13.6 11.2*  HCT 38.6* 40.0 32.6*  MCV 92.3  --  92.6  PLT 188  --  140*   Cardiac Enzymes:   No results for input(s): CKTOTAL, CKMB, CKMBINDEX, TROPONINI in the last 168 hours. BNP (last 3 results) No results for input(s): BNP in the last 8760 hours.  ProBNP (last 3 results) No results for input(s): PROBNP in the last 8760 hours.  CBG:  Recent Labs Lab  12/10/15 1829 12/10/15 2122 12/11/15 0709  GLUCAP 123* 124* 126*    Recent Results (from the past 240 hour(s))  Culture, blood (Routine X 2) w Reflex to ID Panel     Status: None (Preliminary result)   Collection Time: 12/10/15  8:49 AM  Result Value Ref Range Status   Specimen Description BLOOD BLOOD RIGHT FOREARM  Final   Special Requests BOTTLES DRAWN AEROBIC AND ANAEROBIC 5CC  Final   Culture PENDING  Incomplete   Report Status PENDING  Incomplete  MRSA PCR Screening     Status: None   Collection Time: 12/10/15  5:58 PM  Result Value Ref Range Status   MRSA by PCR NEGATIVE NEGATIVE Final    Comment:        The GeneXpert MRSA Assay (FDA approved for NASAL specimens only), is one component of a comprehensive MRSA colonization surveillance program. It is not intended to diagnose MRSA infection nor to guide or monitor treatment for MRSA infections.      Studies: Dg Chest 2 View  12/10/2015  CLINICAL DATA:  Cough, weakness, left shoulder pain EXAM: CHEST  2 VIEW COMPARISON:  09/29/2015 FINDINGS: There is bilateral mild interstitial prominence and prominence of the central pulmonary vasculature. There is no focal parenchymal opacity. There is no pleural effusion or pneumothorax. The heart and mediastinal contours are stable. The osseous structures are unremarkable. IMPRESSION: Mild interstitial edema versus interstitial infection. Electronically Signed   By: Kathreen Devoid   On: 12/10/2015 10:45   Mr Foot Left Wo Contrast  12/10/2015  CLINICAL DATA:  He states he did not feel well last night, with shakes and chills, he has bilateral foot ulcers, right foot with drainage, left foot with redness, he has peripheral neuropathy, does not feel a lot of pain. EXAM: MRI OF THE LEFT FOREFOOT WITHOUT CONTRAST TECHNIQUE: Multiplanar, multisequence MR imaging was performed. No intravenous contrast was administered. COMPARISON:  None. FINDINGS: Bones/Joint/Cartilage No marrow signal abnormality.  No fracture or dislocation. Normal alignment. No joint effusion. Moderate osteoarthritis of the first, second and third tarsometatarsal joints. Advanced arthropathy in the region of the Lisfranc joint, unchanged compared with 12/20/2008. Ligaments Collateral ligaments are intact. Tendons Flexor, peroneal and extensor compartment tendons are intact. Muscles Normal. Soft tissue No fluid collection or hematoma. Soft tissue edema along the dorsal aspect of the forefoot which may reflect cellulitis versus reactive edema. Soft tissue ulcer along the plantar aspect of the first MTP joint. IMPRESSION: 1. No evidence of osteomyelitis of the left forefoot. 2. Soft tissue edema along the dorsal aspect of the forefoot which may reflect cellulitis versus reactive edema. 3. Soft tissue ulcer along the plantar aspect of the first MTP joint. Electronically Signed   By: Kathreen Devoid   On: 12/10/2015 17:17   Dg Foot 2 Views Left  12/10/2015  CLINICAL DATA:  Diabetic foot ulcers, bilateral feet along the plantar surface. EXAM: LEFT FOOT - 2 VIEW COMPARISON:  10/16/2014, 12/19/2008 FINDINGS: Progressive degenerative arthropathy  of the left midfoot involving the cuneiforms and the articulation to first and second metatarsal bases. No malalignment or acute fracture. Healed fracture deformity of the left second toe proximal phalanx. No focal soft tissue swelling or radiopaque foreign body. No evidence of osseous destruction or bone loss. No periostitis. IMPRESSION: No acute osseous finding or radiopaque foreign body. Degenerative changes as above. Electronically Signed   By: Jerilynn Mages.  Shick M.D.   On: 12/10/2015 14:28   Dg Foot 2 Views Right  12/10/2015  CLINICAL DATA:  Decubitus ulcers. EXAM: RIGHT FOOT - 2 VIEW COMPARISON:  None. FINDINGS: Status post surgical amputation of the second phalanges as well as the distal third metatarsal and third phalanges. No acute fracture or dislocation is noted. No lytic destruction is seen to suggest  osteomyelitis. No radiopaque foreign body is noted. IMPRESSION: No definite evidence of osteomyelitis currently. Electronically Signed   By: Marijo Conception, M.D.   On: 12/10/2015 14:30    Scheduled Meds: . enoxaparin (LOVENOX) injection  40 mg Subcutaneous Q24H  . insulin aspart  0-9 Units Subcutaneous TID WC  . metoprolol tartrate  12.5 mg Oral BID  . multivitamin with minerals  1 tablet Oral Daily  . mupirocin ointment   Topical BID  . piperacillin-tazobactam (ZOSYN)  IV  3.375 g Intravenous Q8H  . potassium chloride  40 mEq Oral Q4H  . sodium chloride flush  3 mL Intravenous Q12H  . vancomycin  1,250 mg Intravenous Q12H    Continuous Infusions: . sodium chloride 75 mL/hr at 12/10/15 1852     Time spent: 73mns  Daemyn Gariepy MD, PhD  [facility] Pager [phone] If 7PM-7AM, please contact night-coverage at [email or web address], password [email or web address]7/16/2017, 8:04 AM  LOS: 1 day

## 2015-12-11 NOTE — Consult Note (Signed)
[facility] Consult   Reason for Consult: verbalized suicidal thoughts Referring Physician: Dr. Erlinda Hong Patient Identification: [name] MRN:  [id number] Principal Diagnosis: Anxiety state Diagnosis:   Patient Active Problem List   Diagnosis Date Noted  . Anxiety state [F41.1] 10/26/2013    Priority: Medium  . Sepsis ([place]) [A41.9] 12/10/2015  . Diabetic foot ulcers ([place]) [F79.024, L97.509] 07/12/2015  . Nausea with vomiting [R11.2]   . Dysphagia [R13.10] 08/06/2014  . Vomiting [R11.10] 08/05/2014  . Gastroenteritis presumed infectious [A09] 08/05/2014  . Cellulitis of fourth toe of right foot [L03.031] 10/24/2013  . Acute thoracic back pain [M54.9] 10/09/2012  . PVC (premature ventricular contraction) [I49.3] 07/27/2011  . Chest pain at rest [R07.9] 07/10/2011  . Diabetes mellitus type II, uncontrolled ([place]) [E11.65] 11/02/2010  . ASTHMA NOS W/ACUTE EXACERBATION [J45.901] 06/06/2010  . BASAL CELL CARCINOMA, FACE [173.3] 10/18/2008  . BASAL CELL CARCINOMA, FACE [C44.300] 10/18/2008  . LIPOMA, SKIN [D17.39] 05/31/2008  . DRY SKIN [L85.1] 05/31/2008  . ERECTILE DYSFUNCTION [F52.8] 09/30/2007  . OSTEOARTHRITIS [M19.90] 08/08/2007  . Essential hypertension [I10] 03/24/2007  . ALLERGIC RHINITIS [J30.9] 03/24/2007    Total Time spent with patient: 1 hour  Subjective:   [name] is a 75 y.o. male patient admitted with sepsis  HPI: Thank you for asking me to do a psychiatric consultation on Mr. Hewett, a 75 year old man with NIDDM2, hypertension, foot ulcers followed and anxiety. Patient reports history of receiving counseling from a therapist in the past for anxiety but states that he is doing just fine. He was interviewed in the presence of his daughter who reports that patient just had a moment of frustration last night. Patient lives with his wife who is diagnosed with Parkinson disease, he is her care giver but says he is happy taking care of his wife. Today,  patient denies psychosis, delusional thinking and suicidal ideation, intent or plan.  Past Psychiatric History: Anxiety disorder  Risk to Self: Is patient at risk for suicide?: No Risk to Others:   Prior Inpatient Therapy:   Prior Outpatient Therapy:    Past Medical History:  Past Medical History  Diagnosis Date  . ED (erectile dysfunction)   . Rhinitis   . Hypertension   . Osteoarthritis   . CAD (coronary artery disease)     Minimal plaque cath 2008  . Rheumatic fever   . Diabetes mellitus   . Recurrent kidney stones     Past Surgical History  Procedure Laterality Date  . Inguinal hernia repair    . Colonoscopy  2006, 2010   Family History:  Family History  Problem Relation Age of Onset  . Stroke Father 9    Died age 4  . Coronary artery disease Brother 10  . Colon cancer Neg Hx   . Colon polyps Neg Hx   . Diabetes Neg Hx   . Esophageal cancer Neg Hx   . Kidney disease Neg Hx   . Gallbladder disease Neg Hx    Family Psychiatric  History:  Social History:  History  Alcohol Use  . 1.8 oz/week  . 3 Cans of beer per week    Comment: Occassionally     History  Drug Use No    Social History   Social History  . Marital Status: Married    Spouse Name: N/A  . Number of Children: 1  . Years of Education: N/A   Occupational History  . Retired    Social History  Main Topics  . Smoking status: Former Smoker    Quit date: 07/26/1981  . Smokeless tobacco: Never Used  . Alcohol Use: 1.8 oz/week    3 Cans of beer per week     Comment: Occassionally  . Drug Use: No  . Sexual Activity: No   Other Topics Concern  . None   Social History Narrative   Lives with wife.   Additional Social History:    Allergies:  No Known Allergies  Labs:  Results for orders placed or performed during the hospital encounter of 12/10/15 (from the past 48 hour(s))  Culture, blood (Routine X 2) w Reflex to ID Panel     Status: None (Preliminary result)   Collection Time:  12/10/15  8:49 AM  Result Value Ref Range   Specimen Description BLOOD BLOOD RIGHT FOREARM    Special Requests BOTTLES DRAWN AEROBIC AND ANAEROBIC 5CC    Culture PENDING    Report Status PENDING   CBC with Differential/Platelet     Status: Abnormal   Collection Time: 12/10/15 10:05 AM  Result Value Ref Range   WBC 11.2 (H) 4.0 - 10.5 K/uL   RBC 4.18 (L) 4.22 - 5.81 MIL/uL   Hemoglobin 13.1 13.0 - 17.0 g/dL   HCT 38.6 (L) 39.0 - 52.0 %   MCV 92.3 78.0 - 100.0 fL   MCH 31.3 26.0 - 34.0 pg   MCHC 33.9 30.0 - 36.0 g/dL   RDW 13.2 11.5 - 15.5 %   Platelets 188 150 - 400 K/uL   Neutrophils Relative % 83 %   Neutro Abs 9.4 (H) 1.7 - 7.7 K/uL   Lymphocytes Relative 6 %   Lymphs Abs 0.6 (L) 0.7 - 4.0 K/uL   Monocytes Relative 8 %   Monocytes Absolute 0.9 0.1 - 1.0 K/uL   Eosinophils Relative 2 %   Eosinophils Absolute 0.2 0.0 - 0.7 K/uL   Basophils Relative 1 %   Basophils Absolute 0.1 0.0 - 0.1 K/uL  Basic metabolic panel     Status: Abnormal   Collection Time: 12/10/15 10:05 AM  Result Value Ref Range   Sodium 139 135 - 145 mmol/L   Potassium 3.7 3.5 - 5.1 mmol/L   Chloride 103 101 - 111 mmol/L   CO2 24 22 - 32 mmol/L   Glucose, Bld 140 (H) 65 - 99 mg/dL   BUN 14 6 - 20 mg/dL   Creatinine, Ser 0.61 0.61 - 1.24 mg/dL   Calcium 9.0 8.9 - 10.3 mg/dL   GFR calc non Af Amer >60 >60 mL/min   GFR calc Af Amer >60 >60 mL/min    Comment: (NOTE) The eGFR has been calculated using the CKD EPI equation. This calculation has not been validated in all clinical situations. eGFR's persistently <60 mL/min signify possible Chronic Kidney Disease.    Anion gap 12 5 - 15  Procalcitonin     Status: None   Collection Time: 12/10/15 10:15 AM  Result Value Ref Range   Procalcitonin <0.10 ng/mL    Comment:        Interpretation: PCT (Procalcitonin) <= 0.5 ng/mL: Systemic infection (sepsis) is not likely. Local bacterial infection is possible. (NOTE)         ICU PCT Algorithm                Non ICU PCT Algorithm    ----------------------------     ------------------------------         PCT < 0.25 ng/mL  PCT < 0.1 ng/mL     Stopping of antibiotics            Stopping of antibiotics       strongly encouraged.               strongly encouraged.    ----------------------------     ------------------------------       PCT level decrease by               PCT < 0.25 ng/mL       >= 80% from peak PCT       OR PCT 0.25 - 0.5 ng/mL          Stopping of antibiotics                                             encouraged.     Stopping of antibiotics           encouraged.    ----------------------------     ------------------------------       PCT level decrease by              PCT >= 0.25 ng/mL       < 80% from peak PCT        AND PCT >= 0.5 ng/mL            Continuin g antibiotics                                              encouraged.       Continuing antibiotics            encouraged.    ----------------------------     ------------------------------     PCT level increase compared          PCT > 0.5 ng/mL         with peak PCT AND          PCT >= 0.5 ng/mL             Escalation of antibiotics                                          strongly encouraged.      Escalation of antibiotics        strongly encouraged.   Protime-INR     Status: None   Collection Time: 12/10/15 10:15 AM  Result Value Ref Range   Prothrombin Time 14.7 11.6 - 15.2 seconds   INR 1.18 0.00 - 1.49  APTT     Status: None   Collection Time: 12/10/15 10:15 AM  Result Value Ref Range   aPTT 33 24 - 37 seconds  Urinalysis, Routine w reflex microscopic (not at [facility])     Status: Abnormal   Collection Time: 12/10/15 10:18 AM  Result Value Ref Range   Color, Urine YELLOW YELLOW   APPearance CLEAR CLEAR   Specific Gravity, Urine 1.018 1.005 - 1.030   pH 5.5 5.0 - 8.0   Glucose, UA NEGATIVE NEGATIVE mg/dL   Hgb urine dipstick SMALL (A) NEGATIVE   Bilirubin Urine NEGATIVE NEGATIVE   Ketones,  ur NEGATIVE NEGATIVE  mg/dL   Protein, ur NEGATIVE NEGATIVE mg/dL   Nitrite NEGATIVE NEGATIVE   Leukocytes, UA NEGATIVE NEGATIVE  Urine microscopic-add on     Status: Abnormal   Collection Time: 12/10/15 10:18 AM  Result Value Ref Range   Squamous Epithelial / LPF NONE SEEN NONE SEEN   WBC, UA NONE SEEN 0 - 5 WBC/hpf   RBC / HPF 0-5 0 - 5 RBC/hpf   Bacteria, UA RARE (A) NONE SEEN  I-Stat CG4 Lactic Acid, ED     Status: Abnormal   Collection Time: 12/10/15 10:44 AM  Result Value Ref Range   Lactic Acid, Venous 3.11 (HH) 0.5 - 1.9 mmol/L   Comment NOTIFIED PHYSICIAN   I-stat chem 8, ed     Status: Abnormal   Collection Time: 12/10/15 10:44 AM  Result Value Ref Range   Sodium 138 135 - 145 mmol/L   Potassium 3.8 3.5 - 5.1 mmol/L   Chloride 100 (L) 101 - 111 mmol/L   BUN 12 6 - 20 mg/dL   Creatinine, Ser 0.50 (L) 0.61 - 1.24 mg/dL   Glucose, Bld 139 (H) 65 - 99 mg/dL   Calcium, Ion 1.12 1.12 - 1.23 mmol/L   TCO2 25 0 - 100 mmol/L   Hemoglobin 13.6 13.0 - 17.0 g/dL   HCT 40.0 39.0 - 52.0 %  Lactic acid, plasma     Status: Abnormal   Collection Time: 12/10/15  2:00 PM  Result Value Ref Range   Lactic Acid, Venous 3.1 (HH) 0.5 - 1.9 mmol/L    Comment: CRITICAL RESULT CALLED TO, READ BACK BY AND VERIFIED WITH: ROSSER,M AT 2:45PM ON 12/10/15 BY FESTERMAN,C    Sedimentation rate     Status: Abnormal   Collection Time: 12/10/15  2:03 PM  Result Value Ref Range   Sed Rate 25 (H) 0 - 16 mm/hr  C-reactive protein     Status: None   Collection Time: 12/10/15  2:34 PM  Result Value Ref Range   CRP 0.8 <1.0 mg/dL    Comment: Performed at [facility]  MRSA PCR Screening     Status: None   Collection Time: 12/10/15  5:58 PM  Result Value Ref Range   MRSA by PCR NEGATIVE NEGATIVE    Comment:        The GeneXpert MRSA Assay (FDA approved for NASAL specimens only), is one component of a comprehensive MRSA colonization surveillance program. It is not intended to diagnose  MRSA infection nor to guide or monitor treatment for MRSA infections.   Lactic acid, plasma     Status: None   Collection Time: 12/10/15  6:15 PM  Result Value Ref Range   Lactic Acid, Venous 1.5 0.5 - 1.9 mmol/L  Glucose, capillary     Status: Abnormal   Collection Time: 12/10/15  6:29 PM  Result Value Ref Range   Glucose-Capillary 123 (H) 65 - 99 mg/dL  Glucose, capillary     Status: Abnormal   Collection Time: 12/10/15  9:22 PM  Result Value Ref Range   Glucose-Capillary 124 (H) 65 - 99 mg/dL  CBC     Status: Abnormal   Collection Time: 12/11/15  5:16 AM  Result Value Ref Range   WBC 7.3 4.0 - 10.5 K/uL   RBC 3.52 (L) 4.22 - 5.81 MIL/uL   Hemoglobin 11.2 (L) 13.0 - 17.0 g/dL   HCT 32.6 (L) 39.0 - 52.0 %   MCV 92.6 78.0 - 100.0 fL   MCH 31.8 26.0 -  34.0 pg   MCHC 34.4 30.0 - 36.0 g/dL   RDW 13.4 11.5 - 15.5 %   Platelets 140 (L) 150 - 400 K/uL  Comprehensive metabolic panel     Status: Abnormal   Collection Time: 12/11/15  5:16 AM  Result Value Ref Range   Sodium 135 135 - 145 mmol/L   Potassium 3.2 (L) 3.5 - 5.1 mmol/L   Chloride 103 101 - 111 mmol/L   CO2 27 22 - 32 mmol/L   Glucose, Bld 152 (H) 65 - 99 mg/dL   BUN 10 6 - 20 mg/dL   Creatinine, Ser 0.59 (L) 0.61 - 1.24 mg/dL   Calcium 7.6 (L) 8.9 - 10.3 mg/dL   Total Protein 5.8 (L) 6.5 - 8.1 g/dL   Albumin 3.3 (L) 3.5 - 5.0 g/dL   AST 15 15 - 41 U/L   ALT 12 (L) 17 - 63 U/L   Alkaline Phosphatase 53 38 - 126 U/L   Total Bilirubin 1.1 0.3 - 1.2 mg/dL   GFR calc non Af Amer >60 >60 mL/min   GFR calc Af Amer >60 >60 mL/min    Comment: (NOTE) The eGFR has been calculated using the CKD EPI equation. This calculation has not been validated in all clinical situations. eGFR's persistently <60 mL/min signify possible Chronic Kidney Disease.    Anion gap 5 5 - 15  Magnesium     Status: None   Collection Time: 12/11/15  5:16 AM  Result Value Ref Range   Magnesium 1.9 1.7 - 2.4 mg/dL  Glucose, capillary     Status:  Abnormal   Collection Time: 12/11/15  7:09 AM  Result Value Ref Range   Glucose-Capillary 126 (H) 65 - 99 mg/dL  Glucose, capillary     Status: Abnormal   Collection Time: 12/11/15 11:47 AM  Result Value Ref Range   Glucose-Capillary 136 (H) 65 - 99 mg/dL    Current Facility-Administered Medications  Medication Dose Route Frequency Provider Last Rate Last Dose  . 0.9 %  sodium chloride infusion   Intravenous Continuous Florencia Reasons, MD 75 mL/hr at 12/11/15 1046    . acetaminophen (TYLENOL) tablet 650 mg  650 mg Oral Q6H PRN Brenton Grills, PA-C   650 mg at 12/10/15 2244  . aspirin EC tablet 162 mg  162 mg Oral Daily PRN Florencia Reasons, MD      . enoxaparin (LOVENOX) injection 40 mg  40 mg Subcutaneous Q24H Florencia Reasons, MD   40 mg at 12/10/15 1833  . halobetasol (ULTRAVATE) 8.14 % cream 1 application  1 application Topical BID PRN Florencia Reasons, MD      . insulin aspart (novoLOG) injection 0-9 Units  0-9 Units Subcutaneous TID WC Florencia Reasons, MD   1 Units at 12/11/15 1226  . LORazepam (ATIVAN) tablet 0.5 mg  0.5 mg Oral BID PRN Florencia Reasons, MD   0.5 mg at 12/10/15 2332  . metoprolol tartrate (LOPRESSOR) tablet 12.5 mg  12.5 mg Oral BID Florencia Reasons, MD   12.5 mg at 12/11/15 0953  . multivitamin with minerals tablet 1 tablet  1 tablet Oral Daily Florencia Reasons, MD   1 tablet at 12/11/15 [phone]  . mupirocin ointment (BACTROBAN) 2 %   Topical BID Florencia Reasons, MD      . piperacillin-tazobactam (ZOSYN) IVPB 3.375 g  3.375 g Intravenous Q8H Florencia Reasons, MD   3.375 g at 12/11/15 0953  . sodium chloride flush (NS) 0.9 % injection 3 mL  3 mL  Intravenous Q12H Florencia Reasons, MD   3 mL at 12/11/15 0955  . traMADol (ULTRAM) tablet 25-50 mg  25-50 mg Oral BID PRN Florencia Reasons, MD   25 mg at 12/10/15 1536  . vancomycin (VANCOCIN) 1,250 mg in sodium chloride 0.9 % 250 mL IVPB  1,250 mg Intravenous Q12H Anh P Pham, RPH 166.7 mL/hr at 12/11/15 1045 1,250 mg at 12/11/15 1045    Musculoskeletal: Strength & Muscle Tone: N/A Gait & Station: unsteady Patient leans:  N/A  Psychiatric Specialty Exam: Physical Exam  Psychiatric: His speech is normal and behavior is normal. Judgment and thought content normal. His mood appears anxious. Cognition and memory are normal.    Review of Systems  Respiratory: Negative.   Cardiovascular: Negative.   Gastrointestinal: Negative.   Neurological: Negative.   Endo/Heme/Allergies: Negative.   Psychiatric/Behavioral: The patient is nervous/anxious.     Blood pressure 128/75, pulse 80, temperature 98 F (36.7 C), temperature source Oral, resp. rate 20, height 6' (1.829 m), weight 101.1 kg (222 lb 14.2 oz), SpO2 100 %.Body mass index is 30.22 kg/(m^2).  General Appearance: Casual  Eye Contact:  Good  Speech:  Clear and Coherent  Volume:  Normal  Mood:  Anxious  Affect:  Appropriate  Thought Process:  Coherent and Descriptions of Associations: Intact  Orientation:  Full (Time, Place, and Person)  Thought Content:  Logical  Suicidal Thoughts:  No  Homicidal Thoughts:  No  Memory:  Immediate;   Good Recent;   Good Remote;   Good  Judgement:  Fair  Insight:  Fair  Psychomotor Activity:  Decreased  Concentration:  Concentration: Good and Attention Span: Good  Recall:  Good  Fund of Knowledge:  Good  Language:  Good  Akathisia:  No  Handed:  Right  AIMS (if indicated):     Assets:  Communication Skills Desire for Improvement Physical Health  ADL's:  Intact  Cognition:  WNL  Sleep:   Fair     Treatment Plan Summary: Plan: -Patient denies SI/HI-he is not a danger to self or others.  Disposition: No evidence of imminent risk to self or others at present.   Patient does not meet criteria for psychiatric inpatient admission. Supportive therapy provided about ongoing stressors.  Corena Pilgrim, MD 12/11/2015 12:43 PM

## 2015-12-11 NOTE — Consult Note (Signed)
Reason for Consult:left foot ulcer with cellulitis Referring Physician:  F. Xu Md  [name] is an 75 y.o. male.  HPI: 5 yr hx of type 2 DM on oral meds. Last A1C 5.6. He has midfoot charcot changes with bilat plantar navicular ulcers.  Past Medical History  Diagnosis Date  . ED (erectile dysfunction)   . Rhinitis   . Hypertension   . Osteoarthritis   . CAD (coronary artery disease)     Minimal plaque cath 2008  . Rheumatic fever   . Diabetes mellitus   . Recurrent kidney stones     Past Surgical History  Procedure Laterality Date  . Inguinal hernia repair    . Colonoscopy  2006, 2010    Family History  Problem Relation Age of Onset  . Stroke Father 11    Died age 44  . Coronary artery disease Brother 41  . Colon cancer Neg Hx   . Colon polyps Neg Hx   . Diabetes Neg Hx   . Esophageal cancer Neg Hx   . Kidney disease Neg Hx   . Gallbladder disease Neg Hx     Social History:  reports that he quit smoking about 34 years ago. He has never used smokeless tobacco. He reports that he drinks about 1.8 oz of alcohol per week. He reports that he does not use illicit drugs.  Allergies: No Known Allergies  Medications: I have reviewed the patient's current medications.  Results for orders placed or performed during the hospital encounter of 12/10/15 (from the past 48 hour(s))  Culture, blood (Routine X 2) w Reflex to ID Panel     Status: None (Preliminary result)   Collection Time: 12/10/15  8:49 AM  Result Value Ref Range   Specimen Description BLOOD BLOOD RIGHT FOREARM    Special Requests BOTTLES DRAWN AEROBIC AND ANAEROBIC 5CC    Culture PENDING    Report Status PENDING   CBC with Differential/Platelet     Status: Abnormal   Collection Time: 12/10/15 10:05 AM  Result Value Ref Range   WBC 11.2 (H) 4.0 - 10.5 K/uL   RBC 4.18 (L) 4.22 - 5.81 MIL/uL   Hemoglobin 13.1 13.0 - 17.0 g/dL   HCT 38.6 (L) 39.0 - 52.0 %   MCV 92.3 78.0 - 100.0 fL   MCH 31.3 26.0 - 34.0 pg    MCHC 33.9 30.0 - 36.0 g/dL   RDW 13.2 11.5 - 15.5 %   Platelets 188 150 - 400 K/uL   Neutrophils Relative % 83 %   Neutro Abs 9.4 (H) 1.7 - 7.7 K/uL   Lymphocytes Relative 6 %   Lymphs Abs 0.6 (L) 0.7 - 4.0 K/uL   Monocytes Relative 8 %   Monocytes Absolute 0.9 0.1 - 1.0 K/uL   Eosinophils Relative 2 %   Eosinophils Absolute 0.2 0.0 - 0.7 K/uL   Basophils Relative 1 %   Basophils Absolute 0.1 0.0 - 0.1 K/uL  Basic metabolic panel     Status: Abnormal   Collection Time: 12/10/15 10:05 AM  Result Value Ref Range   Sodium 139 135 - 145 mmol/L   Potassium 3.7 3.5 - 5.1 mmol/L   Chloride 103 101 - 111 mmol/L   CO2 24 22 - 32 mmol/L   Glucose, Bld 140 (H) 65 - 99 mg/dL   BUN 14 6 - 20 mg/dL   Creatinine, Ser 0.61 0.61 - 1.24 mg/dL   Calcium 9.0 8.9 - 10.3 mg/dL   GFR  calc non Af Amer >60 >60 mL/min   GFR calc Af Amer >60 >60 mL/min    Comment: (NOTE) The eGFR has been calculated using the CKD EPI equation. This calculation has not been validated in all clinical situations. eGFR's persistently <60 mL/min signify possible Chronic Kidney Disease.    Anion gap 12 5 - 15  Procalcitonin     Status: None   Collection Time: 12/10/15 10:15 AM  Result Value Ref Range   Procalcitonin <0.10 ng/mL    Comment:        Interpretation: PCT (Procalcitonin) <= 0.5 ng/mL: Systemic infection (sepsis) is not likely. Local bacterial infection is possible. (NOTE)         ICU PCT Algorithm               Non ICU PCT Algorithm    ----------------------------     ------------------------------         PCT < 0.25 ng/mL                 PCT < 0.1 ng/mL     Stopping of antibiotics            Stopping of antibiotics       strongly encouraged.               strongly encouraged.    ----------------------------     ------------------------------       PCT level decrease by               PCT < 0.25 ng/mL       >= 80% from peak PCT       OR PCT 0.25 - 0.5 ng/mL          Stopping of antibiotics                                              encouraged.     Stopping of antibiotics           encouraged.    ----------------------------     ------------------------------       PCT level decrease by              PCT >= 0.25 ng/mL       < 80% from peak PCT        AND PCT >= 0.5 ng/mL            Continuin g antibiotics                                              encouraged.       Continuing antibiotics            encouraged.    ----------------------------     ------------------------------     PCT level increase compared          PCT > 0.5 ng/mL         with peak PCT AND          PCT >= 0.5 ng/mL             Escalation of antibiotics  strongly encouraged.      Escalation of antibiotics        strongly encouraged.   Protime-INR     Status: None   Collection Time: 12/10/15 10:15 AM  Result Value Ref Range   Prothrombin Time 14.7 11.6 - 15.2 seconds   INR 1.18 0.00 - 1.49  APTT     Status: None   Collection Time: 12/10/15 10:15 AM  Result Value Ref Range   aPTT 33 24 - 37 seconds  Urinalysis, Routine w reflex microscopic (not at [facility] : [facility])     Status: Abnormal   Collection Time: 12/10/15 10:18 AM  Result Value Ref Range   Color, Urine YELLOW YELLOW   APPearance CLEAR CLEAR   Specific Gravity, Urine 1.018 1.005 - 1.030   pH 5.5 5.0 - 8.0   Glucose, UA NEGATIVE NEGATIVE mg/dL   Hgb urine dipstick SMALL (A) NEGATIVE   Bilirubin Urine NEGATIVE NEGATIVE   Ketones, ur NEGATIVE NEGATIVE mg/dL   Protein, ur NEGATIVE NEGATIVE mg/dL   Nitrite NEGATIVE NEGATIVE   Leukocytes, UA NEGATIVE NEGATIVE  Urine microscopic-add on     Status: Abnormal   Collection Time: 12/10/15 10:18 AM  Result Value Ref Range   Squamous Epithelial / LPF NONE SEEN NONE SEEN   WBC, UA NONE SEEN 0 - 5 WBC/hpf   RBC / HPF 0-5 0 - 5 RBC/hpf   Bacteria, UA RARE (A) NONE SEEN  I-Stat CG4 Lactic Acid, ED     Status: Abnormal   Collection Time: 12/10/15 10:44 AM  Result Value Ref Range    Lactic Acid, Venous 3.11 (HH) 0.5 - 1.9 mmol/L   Comment NOTIFIED PHYSICIAN   I-stat chem 8, ed     Status: Abnormal   Collection Time: 12/10/15 10:44 AM  Result Value Ref Range   Sodium 138 135 - 145 mmol/L   Potassium 3.8 3.5 - 5.1 mmol/L   Chloride 100 (L) 101 - 111 mmol/L   BUN 12 6 - 20 mg/dL   Creatinine, Ser 0.50 (L) 0.61 - 1.24 mg/dL   Glucose, Bld 139 (H) 65 - 99 mg/dL   Calcium, Ion 1.12 1.12 - 1.23 mmol/L   TCO2 25 0 - 100 mmol/L   Hemoglobin 13.6 13.0 - 17.0 g/dL   HCT 40.0 39.0 - 52.0 %  Lactic acid, plasma     Status: Abnormal   Collection Time: 12/10/15  2:00 PM  Result Value Ref Range   Lactic Acid, Venous 3.1 (HH) 0.5 - 1.9 mmol/L    Comment: CRITICAL RESULT CALLED TO, READ BACK BY AND VERIFIED WITH: ROSSER,M AT 2:45PM ON 12/10/15 BY FESTERMAN,C    Sedimentation rate     Status: Abnormal   Collection Time: 12/10/15  2:03 PM  Result Value Ref Range   Sed Rate 25 (H) 0 - 16 mm/hr  C-reactive protein     Status: None   Collection Time: 12/10/15  2:34 PM  Result Value Ref Range   CRP 0.8 <1.0 mg/dL    Comment: Performed at [facility]  MRSA PCR Screening     Status: None   Collection Time: 12/10/15  5:58 PM  Result Value Ref Range   MRSA by PCR NEGATIVE NEGATIVE    Comment:        The GeneXpert MRSA Assay (FDA approved for NASAL specimens only), is one component of a comprehensive MRSA colonization surveillance program. It is not intended to diagnose MRSA infection nor to guide or monitor treatment for MRSA infections.  Lactic acid, plasma     Status: None   Collection Time: 12/10/15  6:15 PM  Result Value Ref Range   Lactic Acid, Venous 1.5 0.5 - 1.9 mmol/L  Glucose, capillary     Status: Abnormal   Collection Time: 12/10/15  6:29 PM  Result Value Ref Range   Glucose-Capillary 123 (H) 65 - 99 mg/dL  Glucose, capillary     Status: Abnormal   Collection Time: 12/10/15  9:22 PM  Result Value Ref Range   Glucose-Capillary 124 (H) 65 - 99  mg/dL  CBC     Status: Abnormal   Collection Time: 12/11/15  5:16 AM  Result Value Ref Range   WBC 7.3 4.0 - 10.5 K/uL   RBC 3.52 (L) 4.22 - 5.81 MIL/uL   Hemoglobin 11.2 (L) 13.0 - 17.0 g/dL   HCT 32.6 (L) 39.0 - 52.0 %   MCV 92.6 78.0 - 100.0 fL   MCH 31.8 26.0 - 34.0 pg   MCHC 34.4 30.0 - 36.0 g/dL   RDW 13.4 11.5 - 15.5 %   Platelets 140 (L) 150 - 400 K/uL  Comprehensive metabolic panel     Status: Abnormal   Collection Time: 12/11/15  5:16 AM  Result Value Ref Range   Sodium 135 135 - 145 mmol/L   Potassium 3.2 (L) 3.5 - 5.1 mmol/L   Chloride 103 101 - 111 mmol/L   CO2 27 22 - 32 mmol/L   Glucose, Bld 152 (H) 65 - 99 mg/dL   BUN 10 6 - 20 mg/dL   Creatinine, Ser 0.59 (L) 0.61 - 1.24 mg/dL   Calcium 7.6 (L) 8.9 - 10.3 mg/dL   Total Protein 5.8 (L) 6.5 - 8.1 g/dL   Albumin 3.3 (L) 3.5 - 5.0 g/dL   AST 15 15 - 41 U/L   ALT 12 (L) 17 - 63 U/L   Alkaline Phosphatase 53 38 - 126 U/L   Total Bilirubin 1.1 0.3 - 1.2 mg/dL   GFR calc non Af Amer >60 >60 mL/min   GFR calc Af Amer >60 >60 mL/min    Comment: (NOTE) The eGFR has been calculated using the CKD EPI equation. This calculation has not been validated in all clinical situations. eGFR's persistently <60 mL/min signify possible Chronic Kidney Disease.    Anion gap 5 5 - 15  Magnesium     Status: None   Collection Time: 12/11/15  5:16 AM  Result Value Ref Range   Magnesium 1.9 1.7 - 2.4 mg/dL  Glucose, capillary     Status: Abnormal   Collection Time: 12/11/15  7:09 AM  Result Value Ref Range   Glucose-Capillary 126 (H) 65 - 99 mg/dL    Dg Chest 2 View  12/10/2015  CLINICAL DATA:  Cough, weakness, left shoulder pain EXAM: CHEST  2 VIEW COMPARISON:  09/29/2015 FINDINGS: There is bilateral mild interstitial prominence and prominence of the central pulmonary vasculature. There is no focal parenchymal opacity. There is no pleural effusion or pneumothorax. The heart and mediastinal contours are stable. The osseous  structures are unremarkable. IMPRESSION: Mild interstitial edema versus interstitial infection. Electronically Signed   By: Kathreen Devoid   On: 12/10/2015 10:45   Mr Foot Left Wo Contrast  12/10/2015  CLINICAL DATA:  He states he did not feel well last night, with shakes and chills, he has bilateral foot ulcers, right foot with drainage, left foot with redness, he has peripheral neuropathy, does not feel a lot of pain. EXAM: MRI OF  THE LEFT FOREFOOT WITHOUT CONTRAST TECHNIQUE: Multiplanar, multisequence MR imaging was performed. No intravenous contrast was administered. COMPARISON:  None. FINDINGS: Bones/Joint/Cartilage No marrow signal abnormality. No fracture or dislocation. Normal alignment. No joint effusion. Moderate osteoarthritis of the first, second and third tarsometatarsal joints. Advanced arthropathy in the region of the Lisfranc joint, unchanged compared with 12/20/2008. Ligaments Collateral ligaments are intact. Tendons Flexor, peroneal and extensor compartment tendons are intact. Muscles Normal. Soft tissue No fluid collection or hematoma. Soft tissue edema along the dorsal aspect of the forefoot which may reflect cellulitis versus reactive edema. Soft tissue ulcer along the plantar aspect of the first MTP joint. IMPRESSION: 1. No evidence of osteomyelitis of the left forefoot. 2. Soft tissue edema along the dorsal aspect of the forefoot which may reflect cellulitis versus reactive edema. 3. Soft tissue ulcer along the plantar aspect of the first MTP joint. Electronically Signed   By: Kathreen Devoid   On: 12/10/2015 17:17   Dg Foot 2 Views Left  12/10/2015  CLINICAL DATA:  Diabetic foot ulcers, bilateral feet along the plantar surface. EXAM: LEFT FOOT - 2 VIEW COMPARISON:  10/16/2014, 12/19/2008 FINDINGS: Progressive degenerative arthropathy of the left midfoot involving the cuneiforms and the articulation to first and second metatarsal bases. No malalignment or acute fracture. Healed fracture  deformity of the left second toe proximal phalanx. No focal soft tissue swelling or radiopaque foreign body. No evidence of osseous destruction or bone loss. No periostitis. IMPRESSION: No acute osseous finding or radiopaque foreign body. Degenerative changes as above. Electronically Signed   By: Jerilynn Mages.  Shick M.D.   On: 12/10/2015 14:28   Dg Foot 2 Views Right  12/10/2015  CLINICAL DATA:  Decubitus ulcers. EXAM: RIGHT FOOT - 2 VIEW COMPARISON:  None. FINDINGS: Status post surgical amputation of the second phalanges as well as the distal third metatarsal and third phalanges. No acute fracture or dislocation is noted. No lytic destruction is seen to suggest osteomyelitis. No radiopaque foreign body is noted. IMPRESSION: No definite evidence of osteomyelitis currently. Electronically Signed   By: Marijo Conception, M.D.   On: 12/10/2015 14:30    Review of Systems  Constitutional: Positive for fever.  Eyes: Negative for blurred vision and photophobia.  Respiratory: Negative for cough.   Cardiovascular: Negative for chest pain.  Endo/Heme/Allergies:       Pos for DM times 5 yrs  Psychiatric/Behavioral: Positive for depression. Negative for suicidal ideas and substance abuse.   Blood pressure 128/75, pulse 80, temperature 98 F (36.7 C), temperature source Oral, resp. rate 20, height 6' (1.829 m), weight 101.1 kg (222 lb 14.2 oz), SpO2 100 %. Physical Exam  Constitutional: He is oriented to person, place, and time. He appears well-developed and well-nourished.  HENT:  Head: Normocephalic.  Eyes: Pupils are equal, round, and reactive to light.  Neck: Normal range of motion.  Cardiovascular: Normal rate.   Respiratory: Effort normal.  GI: Soft.  Musculoskeletal:  bilat plantar ulcers over medial  Plantar foot navicular. Left forefoot cellulitis  Neurological: He is alert and oriented to person, place, and time. No cranial nerve deficit. Coordination normal.  Skin: There is erythema.  Psychiatric:  Thought content normal.  Wife with severe Parkinsonism.     Assessment/Plan: Left foot cellulitis, midfoot charcot breakdown. MRI neg for osteo or abscess. Has appt with Dr. Sharol Given on Thursday in office. As soon as cellulitis improved can go on oral ABX. He is looking for placement for his wife who has severe Parkinsonism.  Once she is placed he plans on proceeding with correction of midfoot collapse with Dr. Sharol Given.   No need for surgery at this time.   YATES,MARK C 12/11/2015, 10:19 AM

## 2015-12-11 NOTE — Progress Notes (Signed)
Pt speaking about how he was really just "frustrated because I felt like no one was listening to me" regarding his foot ulcers. He stated he wanted them to address some kind of dressing on his feet, and I informed him that after reading through his notes, an orthopedic doctor would be coming to see him today. I cleansed his wounds and placed gauze on them per his request. His mood is improved and he is satisfied with the dressing. Sitter still at bedside. Will continue to monitor patient.

## 2015-12-11 NOTE — Progress Notes (Signed)
Pt complained of feeling like he was "shaking like a leaf" and stated "I need some help." This writer asked him to explain a little more and he stated that "he couldn't take it anymore, I feel like going to the top of this building and jumping off." I then asked, "do you feel like hurting yourself?" and he stated, "Yes I do." He explained that he was having issues with his feet (which is why he is hospitalized) and that his wife is at home in the bed with Parkinson's disease. He is her caregiver, and when asked if anyone was with her he stated "my daughter is, I guess." He was given ativan to help with anxiety, suicide precautions are in place, MD on call paged. Patient is currently asleep, sitter at bedside. Room searched and appropriate precautions taken for safety of patient. Will continue to monitor patient closely.

## 2015-12-12 ENCOUNTER — Encounter: Payer: Self-pay | Admitting: Physician Assistant

## 2015-12-12 ENCOUNTER — Other Ambulatory Visit: Payer: Self-pay | Admitting: Physician Assistant

## 2015-12-12 DIAGNOSIS — L03116 Cellulitis of left lower limb: Secondary | ICD-10-CM

## 2015-12-12 DIAGNOSIS — I471 Supraventricular tachycardia: Secondary | ICD-10-CM

## 2015-12-12 LAB — BASIC METABOLIC PANEL
Anion gap: 6 (ref 5–15)
BUN: 7 mg/dL (ref 6–20)
CO2: 26 mmol/L (ref 22–32)
Calcium: 8.3 mg/dL — ABNORMAL LOW (ref 8.9–10.3)
Chloride: 106 mmol/L (ref 101–111)
Creatinine, Ser: 0.57 mg/dL — ABNORMAL LOW (ref 0.61–1.24)
GFR calc Af Amer: >60 mL/min (ref >60)
GFR calc non Af Amer: >60 mL/min (ref >60)
Glucose, Bld: 114 mg/dL — ABNORMAL HIGH (ref 65–99)
Potassium: 3.8 mmol/L (ref 3.5–5.1)
Sodium: 138 mmol/L (ref 135–145)

## 2015-12-12 LAB — GLUCOSE, CAPILLARY
Glucose-Capillary: 123 mg/dL — ABNORMAL HIGH (ref 65–99)
Glucose-Capillary: 120 mg/dL — ABNORMAL HIGH (ref 65–99)

## 2015-12-12 LAB — HEMOGLOBIN A1C
Hgb A1c MFr Bld: 5.6 % (ref 4.8–5.6)
Mean Plasma Glucose: 114 mg/dL

## 2015-12-12 LAB — MAGNESIUM: Magnesium: 2.1 mg/dL (ref 1.7–2.4)

## 2015-12-12 MED ORDER — SULFAMETHOXAZOLE-TRIMETHOPRIM 800-160 MG PO TABS: 1.0000 | ORAL_TABLET | Freq: Two times a day (BID) | ORAL | Status: DC

## 2015-12-12 MED ORDER — POTASSIUM CHLORIDE CRYS ER 20 MEQ PO TBCR
40.0000 meq | EXTENDED_RELEASE_TABLET | Freq: Once | ORAL | Status: AC
  Administered 2015-12-12: 40 meq via ORAL
  Filled 2015-12-12: qty 2

## 2015-12-12 NOTE — Evaluation (Signed)
Physical Therapy Evaluation-1x Patient Details Name: [name] MRN: [id number] DOB: May 07, 1941 Today's Date: 12/12/2015   History of Present Illness  75 yo male admitted with sepsis, L foot cellultis, midfoot charcot breakdown. hx of DM, HTN, bil foot ulcers, OA, peripheral neuropathy  Clinical Impression  On eval, pt was supervision-Mod Ind with mobility-walked ~200 feet while holding onto either hallway handrail or IV pole. Pt uses cane at baseline for longer distances/community ambulation. Pt denies need for PT services however he was agreeable to participating on today. Pt reports he goes to [facility] 2-3x/week when he can. Pt reports he plans to follow up with Dr Sharol Given for his foot. Do not anticipate any further PT needs and pt is in agreement with this. 1x eval. Will sign off.    Follow Up Recommendations No PT follow up    Equipment Recommendations  None recommended by PT    Recommendations for Other Services       Precautions / Restrictions Precautions Precautions: Fall Restrictions Weight Bearing Restrictions: No      Mobility  Bed Mobility Overal bed mobility: Independent                Transfers Overall transfer level: Modified independent                  Ambulation/Gait Ambulation/Gait assistance: Supervision Ambulation Distance (Feet): 200 Feet Assistive device:  (IV pole vs hallway handrail) Gait Pattern/deviations: Step-through pattern     General Gait Details: for safety. slow gait speed. Pt used either IV pole or hallway handrail. No LOB with 1 UE support.   Stairs            Wheelchair Mobility    Modified Rankin (Stroke Patients Only)       Balance Overall balance assessment: History of Falls;Needs assistance           Standing balance-Leahy Scale: Fair                               Pertinent Vitals/Pain Pain Assessment: No/denies pain    Home Living Family/patient expects to be discharged to:: Private  residence Living Arrangements: Spouse/significant other (wife has Parkinsons)   Type of Home: House         Home Equipment: Gilford Rile - 2 wheels;Cane - single point      Prior Function Level of Independence: Independent with assistive device(s)         Comments: uses cane primarily in community. will occasionally use walker.      Hand Dominance        Extremity/Trunk Assessment   Upper Extremity Assessment: Overall WFL for tasks assessed           Lower Extremity Assessment: LLE deficits/detail   LLE Deficits / Details: noted dressings on foot.   Cervical / Trunk Assessment: Kyphotic  Communication   Communication: No difficulties  Cognition Arousal/Alertness: Awake/alert Behavior During Therapy: WFL for tasks assessed/performed Overall Cognitive Status: Within Functional Limits for tasks assessed                      General Comments      Exercises        Assessment/Plan    PT Assessment Patent does not need any further PT services  PT Diagnosis     PT Problem List    PT Treatment Interventions     PT Goals (Current goals can be  found in the Care Plan section) Acute Rehab PT Goals Patient Stated Goal: home PT Goal Formulation: All assessment and education complete, DC therapy    Frequency     Barriers to discharge        Co-evaluation               End of Session   Activity Tolerance: Patient tolerated treatment well Patient left: in bed;with call bell/phone within reach           Time: 1020-1029 PT Time Calculation (min) (ACUTE ONLY): 9 min   Charges:   PT Evaluation $PT Eval Low Complexity: 1 Procedure     PT G Codes:        Weston Anna, MPT Pager: [phone]

## 2015-12-12 NOTE — Progress Notes (Signed)
Completed D/C teaching with patient. Answered questions. Pt will be D/C home with family in stable condition.

## 2015-12-12 NOTE — Discharge Summary (Signed)
Discharge Summary  [name] WJX:[id number] DOB: [birth year]  PCP: Tyrone Man, MD  Admit date: 12/10/2015 Discharge date: 12/12/2015  Time spent: <49mns  Recommendations for Outpatient Follow-up:  1. F/u with PMD within a week  for hospital discharge follow up, repeat cbc/bmp at follow up, pmd to follow up on urine culture. 2. F/u with orthopedic Dr [name] week on Thursday 3. F/u with cardiology/cardiology office to contact patient for event monitor  Discharge Diagnoses:  Active Hospital Problems   Diagnosis Date Noted  . Anxiety state 10/26/2013  . Sepsis ([place] 12/10/2015    Resolved Hospital Problems   Diagnosis Date Noted Date Resolved  No resolved problems to display.    Discharge Condition: stable  Diet recommendation: heart healthy/carb modified  Filed Weights   12/10/15 0824 12/10/15 1700  Weight: 96.758 kg (213 lb 5 oz) 101.1 kg (222 lb 14.2 oz)    History of present illness:  Chief Complaint: Fever, diabetic Roberts ulcers  HPI: Tyrone FEESis a 75y.o. male   With NIDDM2, htn, Roberts ulcers followed with Dr. DSharol Given( he reported he has an appointment with Dr Tyrone Givenon 7/18) [facility]. He states he did not feel well last night, with shakes and chills, he has bilateral Roberts ulcers, right Roberts with drainage, left Roberts with redness, he has peripheral neuropathy, does not feel a lot of pain. He reported intermittent chronic dry cough,no chest pain,nosob, no dizziness, no falls.  ED course: per EDP he is febirle 103, tachycardia into 130's, bp stable, labs wbc 11.2, lactic acid 3.11, ua no infection, blood culture in process, he is given ivf, vanc/zosyn, hospitalist called to admit the patient for sepsis due to diabetic Roberts ulcers. i have request Roberts imaging.  Hospital Course:  Principal Problem:   Anxiety state Active Problems:   Sepsis ([place]   Sepsis presented on admission with tachycardia, fever, lactic  acidosis, infection source likely diabetic Roberts ulcer. Blood culture in process, sepsis protocol utilized, received ivf/ vanc/zosyn in the hospital. Home diuretics held while in the hospital , resumed at discharge Improved, no fever, no tachycardia at discharge  Bilateral diabetic Roberts ulcers,left Roberts with erysipelis vs cellulitis: Roberts imaging no concern of osteomyelitis, esr 25,crp wnl, received vanc/zosyn, orthopedic Dr Tyrone Mercyconsulted, recommended continue abx treatment and outpatient follow up with Dr Tyrone Givenwhich is scheduled on Thursday.  noninsulin dependent diabetes, last a1c 5.6 in 09/2015,hold metformin in the hospital , restarted at discharge, ssi in the hospital,   Hypokalemia: replace k, mag 1.9  Tachycardia, with frequent pvc's, pac's, suspect brief SVT vs accelerated junctional tachycardia, there is also NSVT, patient denies chest pain/sob/dizziness. bp stable. continue tele, keep k>4,mag >2, continue betablocker ,  Echo with lvef 45-50% with grade 2 diastolic dysfunction. Outpatient event monitor and cardiology follow up.   HTN; home med atenolol-chlorthalidone held in the hospital he was on low dose lopressor in the hospital, atenolol/chlorthalidone restarted at discharge.  SI/labile mood /anxiety/depression: sitter in room, patient is under stress due to be the sole care giver to her bedridden wife, psych consulted, he later denies SI, psych cleared him, sitter cancelled  UTI? Urine culture + g-rods, patient denies urinary symptoms, he is on abx already  Consultants:  orthopedics Dr Tyrone Mercypsychiatry  Code Status: full   Family Communication: Patient and his daughter in room  Disposition Plan: home on 7/17   Procedures:  none  Antibiotics:  Vanc/zosyn from admission to 7/17  Procedures:  none  Consultations:  orthopedic  Discharge Exam: BP 149/79 mmHg  Pulse 70  Temp(Src) 97.8 F (36.6 C) (Oral)  Resp 20  Ht 6' (1.829 m)  Wt 101.1 kg (222 lb  14.2 oz)  BMI 30.22 kg/m2  SpO2 99%    General: NAD, pleasant  Eyes: PERRL  ENT: unremarkable  Neck: supple, no JVD  Cardiovascular: RRR  Respiratory: CTABL  Abdomen: soft/ND/ND, positive bowel sounds  Skin: no rash  Musculoskeletal:   Psychiatric: calm/cooperative at discharge  Neurologic: bilateral Roberts deformity, mid Roberts ulcers on medial bilateral planter surfaces, Right Roberts ulcer bulgy, with a small opening in the center, with scant clear drainage, no significant erythema. left Roberts ulcer with intact scab, no opening, erythema from forefoot to midfoot has much improved, red streak that was extending up to anterior left shin and upper thigh has resolved at discharge   Discharge Instructions You were cared for by a hospitalist during your hospital stay. If you have any questions about your discharge medications or the care you received while you were in the hospital after you are discharged, you can call the unit and asked to speak with the hospitalist on call if the hospitalist that took care of you is not available. Once you are discharged, your primary care physician will handle any further medical issues. Please note that NO REFILLS for any discharge medications will be authorized once you are discharged, as it is imperative that you return to your primary care physician (or establish a relationship with a primary care physician if you do not have one) for your aftercare needs so that they can reassess your need for medications and monitor your lab values.      Discharge Instructions    Diet - low sodium heart healthy    Complete by:  As directed   Carb modified diet     Increase activity slowly    Complete by:  As directed             Medication List    TAKE these medications        ACCU-CHEK AVIVA PLUS test strip  Generic drug:  glucose blood  USE TO TEST DAILY AS INSTRUCTED PER MD**EMERGENCY FILL MEDICAL REASON**     accu-chek multiclix lancets  USE  TO TEST ONCE DAILY     aspirin 81 MG tablet  Take 162 mg by mouth daily as needed for pain (pain).     atenolol-chlorthalidone 50-25 MG tablet  Commonly known as:  TENORETIC  TAKE ONE TABLET BY MOUTH ONE TIME DAILY     citalopram 10 MG tablet  Commonly known as:  CELEXA  Take 1 tablet (10 mg total) by mouth daily.     halobetasol 0.05 % cream  Commonly known as:  ULTRAVATE  Apply topically 2 (two) times daily. to affected area     loratadine 10 MG tablet  Commonly known as:  CLARITIN  Take 10 mg by mouth daily as needed for allergies.     LORazepam 0.5 MG tablet  Commonly known as:  ATIVAN  Take 1 tablet (0.5 mg total) by mouth 2 (two) times daily as needed for anxiety.     metFORMIN 1000 MG tablet  Commonly known as:  GLUCOPHAGE  TAKE ONE TABLET BY MOUTH TWICE DAILY     multivitamin tablet  Take 1 tablet by mouth daily.     mupirocin ointment 2 %  Commonly known as:  BACTROBAN  APPLY TO AFFECTED AREA  TWICE DAILY AS NEEDED FOR CUTS OR SCRAPS     potassium chloride SA 20 MEQ tablet  Commonly known as:  K-DUR,KLOR-CON  2 tabs daily in the morning     sulfamethoxazole-trimethoprim 800-160 MG tablet  Commonly known as:  BACTRIM DS,SEPTRA DS  Take 1 tablet by mouth 2 (two) times daily.     traMADol 50 MG tablet  Commonly known as:  ULTRAM  Half to one tab twice daily as needed for back pain     TURMERIC PO  Take 1 tablet by mouth daily as needed (FOR LEG CRAMPS).       No Known Allergies Follow-up Information    Follow up with Minus Breeding, MD.   Specialty:  Cardiology   Why:  [facility] - 01/19/16 - arrive at 9:30am. This is at the [place]. Office will call you to arrange your heart monitor.   Contact information:   [address] [place] [address] [phone]       Follow up with [name],[name] Resides, MD In 1 week.   Specialty:  Family Medicine   Why:  hosptital discharge follow up   Contact  information:   [place] [address] [phone]       Follow up with Tyrone Minion, MD On 12/15/2015.   Specialty:  Orthopedic Surgery   Why:  diabetic Roberts ulcer   Contact information:   [place]  [address] [phone]        The results of significant diagnostics from this hospitalization (including imaging, microbiology, ancillary and laboratory) are listed below for reference.    Significant Diagnostic Studies: Dg Chest 2 View  12/10/2015  CLINICAL DATA:  Cough, weakness, left shoulder pain EXAM: CHEST  2 VIEW COMPARISON:  09/29/2015 FINDINGS: There is bilateral mild interstitial prominence and prominence of the central pulmonary vasculature. There is no focal parenchymal opacity. There is no pleural effusion or pneumothorax. The heart and mediastinal contours are stable. The osseous structures are unremarkable. IMPRESSION: Mild interstitial edema versus interstitial infection. Electronically Signed   By: Kathreen Devoid   On: 12/10/2015 10:45   [name] Left Wo Contrast  12/10/2015  CLINICAL DATA:  He states he did not feel well last night, with shakes and chills, he has bilateral Roberts ulcers, right Roberts with drainage, left Roberts with redness, he has peripheral neuropathy, does not feel a lot of pain. EXAM: MRI OF THE LEFT FOREFOOT WITHOUT CONTRAST TECHNIQUE: Multiplanar, multisequence Tyrone imaging was performed. No intravenous contrast was administered. COMPARISON:  None. FINDINGS: Bones/Joint/Cartilage No marrow signal abnormality. No fracture or dislocation. Normal alignment. No joint effusion. Moderate osteoarthritis of the first, second and third tarsometatarsal joints. Advanced arthropathy in the region of the Lisfranc joint, unchanged compared with 12/20/2008. Ligaments Collateral ligaments are intact. Tendons Flexor, peroneal and extensor compartment tendons are intact. Muscles Normal. Soft tissue No fluid collection or hematoma. Soft  tissue edema along the dorsal aspect of the forefoot which may reflect cellulitis versus reactive edema. Soft tissue ulcer along the plantar aspect of the first MTP joint. IMPRESSION: 1. No evidence of osteomyelitis of the left forefoot. 2. Soft tissue edema along the dorsal aspect of the forefoot which may reflect cellulitis versus reactive edema. 3. Soft tissue ulcer along the plantar aspect of the first MTP joint. Electronically Signed   By: Kathreen Devoid   On: 12/10/2015 17:17   Dg Roberts 2 Views Left  12/10/2015  CLINICAL  DATA:  Diabetic Roberts ulcers, bilateral feet along the plantar surface. EXAM: LEFT Roberts - 2 VIEW COMPARISON:  10/16/2014, 12/19/2008 FINDINGS: Progressive degenerative arthropathy of the left midfoot involving the cuneiforms and the articulation to first and second metatarsal bases. No malalignment or acute fracture. Healed fracture deformity of the left second toe proximal phalanx. No focal soft tissue swelling or radiopaque foreign body. No evidence of osseous destruction or bone loss. No periostitis. IMPRESSION: No acute osseous finding or radiopaque foreign body. Degenerative changes as above. Electronically Signed   By: Jerilynn Mages.  Shick M.D.   On: 12/10/2015 14:28   Dg Roberts 2 Views Right  12/10/2015  CLINICAL DATA:  Decubitus ulcers. EXAM: RIGHT Roberts - 2 VIEW COMPARISON:  None. FINDINGS: Status post surgical amputation of the second phalanges as well as the distal third metatarsal and third phalanges. No acute fracture or dislocation is noted. No lytic destruction is seen to suggest osteomyelitis. No radiopaque foreign body is noted. IMPRESSION: No definite evidence of osteomyelitis currently. Electronically Signed   By: Marijo Conception, M.D.   On: 12/10/2015 14:30    Microbiology: Recent Results (from the past 240 hour(s))  Culture, blood (Routine X 2) w Reflex to ID Panel     Status: None (Preliminary result)   Collection Time: 12/10/15  8:49 AM  Result Value Ref Range Status    Specimen Description BLOOD BLOOD RIGHT FOREARM  Final   Special Requests BOTTLES DRAWN AEROBIC AND ANAEROBIC 5CC  Final   Culture   Final    NO GROWTH 2 DAYS Performed at [facility]    Report Status PENDING  Incomplete  Urine culture     Status: Abnormal (Preliminary result)   Collection Time: 12/10/15 10:18 AM  Result Value Ref Range Status   Specimen Description URINE, CLEAN CATCH  Final   Special Requests NONE  Final   Culture 40,000 COLONIES/mL GRAM NEGATIVE RODS (A)  Final   Report Status PENDING  Incomplete  Culture, blood (Routine X 2) w Reflex to ID Panel     Status: None (Preliminary result)   Collection Time: 12/10/15 10:36 AM  Result Value Ref Range Status   Specimen Description BLOOD RIGHT HAND  Final   Special Requests BOTTLES DRAWN AEROBIC AND ANAEROBIC 5CC  Final   Culture   Final    NO GROWTH 2 DAYS Performed at [facility]    Report Status PENDING  Incomplete  MRSA PCR Screening     Status: None   Collection Time: 12/10/15  5:58 PM  Result Value Ref Range Status   MRSA by PCR NEGATIVE NEGATIVE Final    Comment:        The GeneXpert MRSA Assay (FDA approved for NASAL specimens only), is one component of a comprehensive MRSA colonization surveillance program. It is not intended to diagnose MRSA infection nor to guide or monitor treatment for MRSA infections.      Labs: Basic Metabolic Panel:  Recent Labs Lab 12/10/15 1005 12/10/15 1044 12/11/15 0516 12/12/15 0449  NA 139 138 135 138  K 3.7 3.8 3.2* 3.8  CL 103 100* 103 106  CO2 24  --  27 26  GLUCOSE 140* 139* 152* 114*  BUN '14 12 10 7  ' CREATININE 0.61 0.50* 0.59* 0.57*  CALCIUM 9.0  --  7.6* 8.3*  MG  --   --  1.9 2.1   Liver Function Tests:  Recent Labs Lab 12/11/15 0516  AST 15  ALT 12*  ALKPHOS  53  BILITOT 1.1  PROT 5.8*  ALBUMIN 3.3*   No results for input(s): LIPASE, AMYLASE in the last 168 hours. No results for input(s): AMMONIA in the last 168  hours. CBC:  Recent Labs Lab 12/10/15 1005 12/10/15 1044 12/11/15 0516  WBC 11.2*  --  7.3  NEUTROABS 9.4*  --   --   HGB 13.1 13.6 11.2*  HCT 38.6* 40.0 32.6*  MCV 92.3  --  92.6  PLT 188  --  140*   Cardiac Enzymes: No results for input(s): CKTOTAL, CKMB, CKMBINDEX, TROPONINI in the last 168 hours. BNP: BNP (last 3 results) No results for input(s): BNP in the last 8760 hours.  ProBNP (last 3 results) No results for input(s): PROBNP in the last 8760 hours.  CBG:  Recent Labs Lab 12/11/15 1147 12/11/15 1759 12/11/15 2140 12/12/15 0722 12/12/15 1208  GLUCAP 136* 104* 136* 123* 120*       Signed:  Dwana Garin MD, PhD  [facility] 12/12/2015, 3:08 PM

## 2015-12-12 NOTE — Progress Notes (Signed)
Request received from Dr. Erlinda Hong to arrange 30-day event monitor for SVT and f/u appt. I d/w scheduling - f/u appt scheduled for 01/19/16 to arrive at 9:30am with Dr. Percival Spanish. He is technically a new pt again since he has not been seen within 3 yrs' time. I put this appt on his DC FU section with emphasis this is the [facility] location. Butch Penny with scheduling asked me to send message to Satira Sark to arrange 30-day event monitor which I have done. Office will call patient to arrange event monitor.  Auren Valdes PA-C

## 2015-12-12 NOTE — Progress Notes (Signed)
Subjective: Doing well.  No specific complaints.    Objective: Vital signs in last 24 hours: Temp:  [97.8 F (36.6 C)-98.1 F (36.7 C)] 97.8 F (36.6 C) (07/17 0451) Pulse Rate:  [70-79] 70 (07/17 0451) Resp:  [20] 20 (07/17 0451) BP: (131-149)/(70-79) 149/79 mmHg (07/17 0451) SpO2:  [95 %-100 %] 99 % (07/17 0451)  Intake/Output from previous day: 07/16 0701 - 07/17 0700 In: 3390 [P.O.:940; I.V.:1800; IV Piggyback:650] Out: 200 [Urine:200] Intake/Output this shift:     Recent Labs  12/10/15 1005 12/10/15 1044 12/11/15 0516  HGB 13.1 13.6 11.2*    Recent Labs  12/10/15 1005 12/10/15 1044 12/11/15 0516  WBC 11.2*  --  7.3  RBC 4.18*  --  3.52*  HCT 38.6* 40.0 32.6*  PLT 188  --  140*    Recent Labs  12/11/15 0516 12/12/15 0449  NA 135 138  K 3.2* 3.8  CL 103 106  CO2 27 26  BUN 10 7  CREATININE 0.59* 0.57*  GLUCOSE 152* 114*  CALCIUM 7.6* 8.3*    Recent Labs  12/10/15 1015  INR 1.18    Exam:  Very pleasant wm, alert and oriented, NAD.  In very good spirits this morning.  bilat foot dressings changed.  bilat calves nontender,    Assessment/Plan: Continue present care.  F/u with Dr Sharol Given this week if discharges home.    Shaena Parkerson M 12/12/2015, 8:27 AM

## 2015-12-13 LAB — URINE CULTURE: Culture: 40000 — AB

## 2015-12-15 DIAGNOSIS — L97411 Non-pressure chronic ulcer of right heel and midfoot limited to breakdown of skin: Secondary | ICD-10-CM | POA: Diagnosis not present

## 2015-12-15 DIAGNOSIS — L97421 Non-pressure chronic ulcer of left heel and midfoot limited to breakdown of skin: Secondary | ICD-10-CM | POA: Diagnosis not present

## 2015-12-15 DIAGNOSIS — E11610 Type 2 diabetes mellitus with diabetic neuropathic arthropathy: Secondary | ICD-10-CM | POA: Diagnosis not present

## 2015-12-15 LAB — CULTURE, BLOOD (ROUTINE X 2)
Culture: NO GROWTH
Culture: NO GROWTH

## 2015-12-23 ENCOUNTER — Telehealth: Payer: Self-pay | Admitting: Cardiology

## 2015-12-23 NOTE — Telephone Encounter (Signed)
Returned call to patient. Per lady answering phone, he has just left home and will return in about 45 minutes. Asked that he call back when he gets home.   30 day event monitor placed at [facility] on 7/20  Patient has New Patient appointment with Dr. Percival Spanish on 01/19/16 (he saw MD in past but it has been since 07/2011 when he was last seen

## 2015-12-23 NOTE — Telephone Encounter (Signed)
New Message    Pt call requesting to speak with RN about the event Monitor. Pt had some concerns an wanted to discuss with RN. Please call back to discuss

## 2015-12-26 NOTE — Telephone Encounter (Signed)
Spoke with pt, questions regarding the reason the monitor was ordered and what it will tell us answered. He is now willing to schedule. Will forward top [facility] to schedule.

## 2015-12-27 ENCOUNTER — Ambulatory Visit (INDEPENDENT_AMBULATORY_CARE_PROVIDER_SITE_OTHER): Payer: Medicare Other

## 2015-12-27 DIAGNOSIS — I471 Supraventricular tachycardia: Secondary | ICD-10-CM | POA: Diagnosis not present

## 2015-12-29 DIAGNOSIS — L97421 Non-pressure chronic ulcer of left heel and midfoot limited to breakdown of skin: Secondary | ICD-10-CM | POA: Diagnosis not present

## 2015-12-29 DIAGNOSIS — L97411 Non-pressure chronic ulcer of right heel and midfoot limited to breakdown of skin: Secondary | ICD-10-CM | POA: Diagnosis not present

## 2016-01-05 ENCOUNTER — Encounter: Payer: Self-pay | Admitting: Cardiology

## 2016-01-11 ENCOUNTER — Other Ambulatory Visit: Payer: Self-pay | Admitting: Family Medicine

## 2016-01-11 ENCOUNTER — Telehealth: Payer: Self-pay | Admitting: Cardiology

## 2016-01-13 NOTE — Telephone Encounter (Signed)
Okay to refill? 

## 2016-01-19 ENCOUNTER — Ambulatory Visit: Payer: Medicare Other | Admitting: Cardiology

## 2016-01-20 ENCOUNTER — Encounter: Payer: Self-pay | Admitting: Internal Medicine

## 2016-01-20 ENCOUNTER — Ambulatory Visit (INDEPENDENT_AMBULATORY_CARE_PROVIDER_SITE_OTHER): Payer: Medicare Other | Admitting: Internal Medicine

## 2016-01-20 VITALS — BP 150/84 | HR 90 | Ht 72.0 in | Wt 221.2 lb

## 2016-01-20 DIAGNOSIS — I493 Ventricular premature depolarization: Secondary | ICD-10-CM | POA: Diagnosis not present

## 2016-01-20 DIAGNOSIS — I471 Supraventricular tachycardia, unspecified: Secondary | ICD-10-CM | POA: Insufficient documentation

## 2016-01-20 DIAGNOSIS — Z79899 Other long term (current) drug therapy: Secondary | ICD-10-CM

## 2016-01-20 DIAGNOSIS — IMO0002 Reserved for concepts with insufficient information to code with codable children: Secondary | ICD-10-CM

## 2016-01-20 DIAGNOSIS — I5022 Chronic systolic (congestive) heart failure: Secondary | ICD-10-CM | POA: Diagnosis not present

## 2016-01-20 DIAGNOSIS — E1165 Type 2 diabetes mellitus with hyperglycemia: Secondary | ICD-10-CM

## 2016-01-20 DIAGNOSIS — L97509 Non-pressure chronic ulcer of other part of unspecified foot with unspecified severity: Secondary | ICD-10-CM

## 2016-01-20 DIAGNOSIS — E11621 Type 2 diabetes mellitus with foot ulcer: Secondary | ICD-10-CM

## 2016-01-20 MED ORDER — ATENOLOL-CHLORTHALIDONE 50-25 MG PO TABS
1.0000 | ORAL_TABLET | Freq: Every day | ORAL | 5 refills | Status: DC
Start: 2016-01-20 — End: 2016-03-06

## 2016-01-20 NOTE — Patient Instructions (Addendum)
Your physician has recommended you make the following change in your medication: INCREASE atenolol-chlorthalidone to whole tablet daily   Your physician recommends that you return for lab work in [place] (BMET)  Your physician recommends that you schedule a follow-up appointment in: [place] with Dr. Debara Pickett.

## 2016-01-20 NOTE — Progress Notes (Signed)
OFFICE NOTE  Chief Complaint:  Hospital follow-up  Primary Care Physician: Tyrone Man, MD  HPI:  [name] is a 75 y.o. male he was previously seen by [name] last in 2013. He was followed for PVCs at that time and underwent stress testing. There is no evidence for ischemia and no further workup was recommended. Recently was hospitalized for sepsis and was noted to have intermittent SVTs as well as PVCs on telemetry. Although he was not seen by cardiology as an inpatient, we were consult it for outpatient monitor. He was placed on a monitor which I reviewed and is still in progress. The first 24 days of the monitor show frequent PVCs as well as intermittent SVT. During this past hospitalization he had an echocardiogram which shows a newly reduced LVEF of 45-50%, with basal inferior akinesis and inferolateral hypokinesis and grade 2 diastolic dysfunction. This compares to a normal LVF and 2013 by echo. He did have a remote left heart catheterization in 2008 which showed minimal coronary artery disease. He denies any chest pain or worsening shortness of breath. He is having trouble ambulating due to problems with his right ankle. He also reports being under significant stress dealing with his wife who has Parkinson's. He is in the process of moving to friends [place].  PMHx:  Past Medical History:  Diagnosis Date  . CAD (coronary artery disease)    Minimal plaque cath 2008  . Diabetes mellitus   . ED (erectile dysfunction)   . Hypertension   . Osteoarthritis   . Recurrent kidney stones   . Rheumatic fever   . Rhinitis     Past Surgical History:  Procedure Laterality Date  . colonoscopy  2006, 2010  . INGUINAL HERNIA REPAIR      FAMHx:  Family History  Problem Relation Age of Onset  . Stroke Father 1    Died age 75  . Coronary artery disease Brother 50  . Colon cancer Neg Hx   . Colon polyps Neg Hx   . Diabetes Neg Hx   . Esophageal cancer Neg Hx   . Kidney  disease Neg Hx   . Gallbladder disease Neg Hx     SOCHx:   reports that he quit smoking about 34 years ago. He has never used smokeless tobacco. He reports that he drinks about 1.8 oz of alcohol per week . He reports that he does not use drugs.  ALLERGIES:  No Known Allergies  ROS: Pertinent items noted in HPI and remainder of comprehensive ROS otherwise negative.  HOME MEDS: Current Outpatient Prescriptions  Medication Sig Dispense Refill  . ACCU-CHEK AVIVA PLUS test strip USE TO TEST DAILY AS INSTRUCTED PER MD**EMERGENCY FILL MEDICAL REASON** 100 each 3  . aspirin 81 MG tablet Take 162 mg by mouth daily as needed for pain (pain).     Marland Kitchen atenolol-chlorthalidone (TENORETIC) 50-25 MG tablet Take 1 tablet by mouth daily. 30 tablet 5  . halobetasol (ULTRAVATE) 0.05 % cream Apply 1 application topically as directed.    . Lancets (ACCU-CHEK MULTICLIX) lancets USE TO TEST ONCE DAILY 102 each 3  . loratadine (CLARITIN) 10 MG tablet Take 10 mg by mouth daily as needed for allergies.     Marland Kitchen LORazepam (ATIVAN) 0.5 MG tablet TAKE 1 TABLET BY MOUTH TWICE A DAY AS NEEDED 60 tablet 0  . metFORMIN (GLUCOPHAGE) 1000 MG tablet TAKE ONE TABLET BY MOUTH TWICE DAILY 180 tablet 3  . Multiple Vitamin (MULTIVITAMIN) tablet  Take 1 tablet by mouth daily.    . mupirocin ointment (BACTROBAN) 2 % APPLY TO AFFECTED AREA TWICE DAILY AS NEEDED FOR CUTS OR SCRAPS  0  . potassium chloride SA (K-DUR,KLOR-CON) 20 MEQ tablet 2 tabs daily in the morning 270 tablet 1  . sulfamethoxazole-trimethoprim (BACTRIM DS,SEPTRA DS) 800-160 MG tablet Take 1 tablet by mouth 2 (two) times daily. 8 tablet 0  . traMADol (ULTRAM) 50 MG tablet Take 50 mg by mouth as directed.    . TURMERIC PO Take 1 tablet by mouth daily as needed (FOR LEG CRAMPS).      No current facility-administered medications for this visit.     LABS/IMAGING: No results found for this or any previous visit (from the past 48 hour(s)). No results  found.  WEIGHTS: Wt Readings from Last 3 Encounters:  01/20/16 221 lb 3.2 oz (100.3 kg)  12/10/15 222 lb 14.2 oz (101.1 kg)  10/05/15 223 lb 3.2 oz (101.2 kg)    VITALS: BP (!) 150/84   Pulse 90   Ht 6' (1.829 m)   Wt 221 lb 3.2 oz (100.3 kg)   BMI 30.00 kg/m   EXAM: General appearance: alert, no distress and mildly obese Neck: no carotid bruit and no JVD Lungs: clear to auscultation bilaterally Heart: regular rate and rhythm Abdomen: soft, non-tender; bowel sounds normal; no masses,  no organomegaly Extremities: edema trace edema Pulses: 2+ and symmetric Skin: Skin color, texture, turgor normal. No rashes or lesions Neurologic: Grossly normal Psych: Pleasant  EKG: Deferred  ASSESSMENT: 1. 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 Frequent PVCs and intermittent SVT-questionably symptomatic 3. Hypertension 4. Type 2 diabetes  PLAN: 1.   [name] was recently hospitalized with sepsis and was found to have a reduced EF of 45-50% with some regional wall motion abnormalities. He was not evaluated for worsening coronary ischemia, but given these changes should have an ischemia evaluation. I discussed stress testing or cardiac catheterization but he reported he was under significant time pressures at this point with his wife and did not feel that he could have the time now to undergo any further testing. Based on this, I recommended at least considering medical therapy for his frequent PVCs and SVT. In addition he would need optimization of medical therapy for this cardiomyopathy. I recommended initially increasing his Tenoretic up to a full tablet of 50/25 mg daily. If blood pressure and renal function allow, he should also be on ARB or Entresto. I'm not sure if he will be willing to pursue catheterization or ischemia workup in the future, and I may just need to focus on medical therapy. Hopefully an increase in his beta blocker will reduce some of  his palpitations.  He wishes a follow-up with me in 3 months after he is more settled and we will further discuss treatment at that time.  Pixie Casino, MD, [facility] Attending Cardiologist [place] 01/20/2016, 5:13 PM

## 2016-01-23 ENCOUNTER — Encounter: Payer: Self-pay | Admitting: *Deleted

## 2016-01-23 DIAGNOSIS — Z79899 Other long term (current) drug therapy: Secondary | ICD-10-CM | POA: Diagnosis not present

## 2016-01-23 DIAGNOSIS — L97421 Non-pressure chronic ulcer of left heel and midfoot limited to breakdown of skin: Secondary | ICD-10-CM | POA: Diagnosis not present

## 2016-01-23 DIAGNOSIS — E11610 Type 2 diabetes mellitus with diabetic neuropathic arthropathy: Secondary | ICD-10-CM | POA: Diagnosis not present

## 2016-01-23 DIAGNOSIS — L97411 Non-pressure chronic ulcer of right heel and midfoot limited to breakdown of skin: Secondary | ICD-10-CM | POA: Diagnosis not present

## 2016-01-24 ENCOUNTER — Telehealth: Payer: Self-pay | Admitting: Internal Medicine

## 2016-01-24 DIAGNOSIS — R6889 Other general symptoms and signs: Secondary | ICD-10-CM

## 2016-01-24 DIAGNOSIS — I5022 Chronic systolic (congestive) heart failure: Secondary | ICD-10-CM

## 2016-01-24 DIAGNOSIS — R0989 Other specified symptoms and signs involving the circulatory and respiratory systems: Secondary | ICD-10-CM

## 2016-01-24 DIAGNOSIS — Z9189 Other specified personal risk factors, not elsewhere classified: Secondary | ICD-10-CM

## 2016-01-24 DIAGNOSIS — I471 Supraventricular tachycardia: Secondary | ICD-10-CM

## 2016-01-24 DIAGNOSIS — I493 Ventricular premature depolarization: Secondary | ICD-10-CM

## 2016-01-24 DIAGNOSIS — R931 Abnormal findings on diagnostic imaging of heart and coronary circulation: Secondary | ICD-10-CM

## 2016-01-24 DIAGNOSIS — I10 Essential (primary) hypertension: Secondary | ICD-10-CM

## 2016-01-24 LAB — BASIC METABOLIC PANEL
BUN: 16 mg/dL (ref 7–25)
CO2: 27 mmol/L (ref 20–31)
Calcium: 9.5 mg/dL (ref 8.6–10.3)
Chloride: 100 mmol/L (ref 98–110)
Creat: 0.79 mg/dL (ref 0.70–1.18)
Glucose, Bld: 119 mg/dL — ABNORMAL HIGH (ref 65–99)
Potassium: 4 mmol/L (ref 3.5–5.3)
Sodium: 140 mmol/L (ref 135–146)

## 2016-01-24 NOTE — Telephone Encounter (Signed)
Pt is calling back to discuss about whether he is going to have the stress test.

## 2016-01-24 NOTE — Telephone Encounter (Signed)
Spoke w/ patient. States he would be amenable to doing a stress test at this time, unsure that he can undergo cath. Wants to see if Dr. Debara Pickett is OK w this. Notes he was just seen the other day and also noted difficulty in scheduling procedures d/t ongoing care for wife. Patient is aware that we can call him to arrange a time for stress test that would work for him.  He states due to bad foot he would need to have non-treadmill exam.  Patient aware I will route for Dr. Lysbeth Penner OK on order, scheduler will call patient to coordinate test.

## 2016-01-24 NOTE — Telephone Encounter (Signed)
Thanks .Marland Kitchen I would advise a lexiscan myoview.   DR. Lemmie Evens

## 2016-01-25 NOTE — Telephone Encounter (Signed)
Stress test ordered. Message routed to [facility] to assist w/arranging test.

## 2016-02-06 ENCOUNTER — Ambulatory Visit (INDEPENDENT_AMBULATORY_CARE_PROVIDER_SITE_OTHER): Payer: Medicare Other | Admitting: Adult Health

## 2016-02-06 ENCOUNTER — Encounter: Payer: Self-pay | Admitting: Adult Health

## 2016-02-06 VITALS — BP 128/74 | Temp 97.6°F | Ht 72.0 in | Wt 220.0 lb

## 2016-02-06 DIAGNOSIS — E1165 Type 2 diabetes mellitus with hyperglycemia: Secondary | ICD-10-CM | POA: Diagnosis not present

## 2016-02-06 DIAGNOSIS — IMO0001 Reserved for inherently not codable concepts without codable children: Secondary | ICD-10-CM

## 2016-02-06 LAB — POCT GLYCOSYLATED HEMOGLOBIN (HGB A1C): Hemoglobin A1C: 5.8

## 2016-02-06 LAB — GLUCOSE, POCT (MANUAL RESULT ENTRY): POC Glucose: 143 mg/dl — AB (ref 70–99)

## 2016-02-06 NOTE — Progress Notes (Signed)
Subjective:    Patient ID: [name], male    DOB: [birth year], 75 y.o.   MRN: [id number]  HPI  75 year old male who presents to the office today for increasing blood sugars. He reports that at home his blood pressures have been in the 120-140's at home. He is not exercising as much as he would like due to having foot pain ( he sees Dr. Toni Amend this week). His wife is also being admitted to a medical unit at a local facility and this is causing him stress.   His last A1c was 5.6 about 1.5 months ago. He has not changed anything in his diet but does not follow a diabetic diet all the time.   He takes 1000mg  Metformin BID  Review of Systems  Constitutional: Negative.   Respiratory: Negative.   Cardiovascular: Negative.   Gastrointestinal: Negative.   Genitourinary: Negative.   Neurological: Negative.   Psychiatric/Behavioral: Negative.   All other systems reviewed and are negative.  Past Medical History:  Diagnosis Date  . CAD (coronary artery disease)    Minimal plaque cath 2008  . Diabetes mellitus   . ED (erectile dysfunction)   . Hypertension   . Osteoarthritis   . Recurrent kidney stones   . Rheumatic fever   . Rhinitis     Social History   Social History  . Marital status: Married    Spouse name: N/A  . Number of children: 1  . Years of education: 16+   Occupational History  . 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 Smoking status: Former Smoker    Quit date: 07/26/1981  . Smokeless tobacco: Never Used  . Alcohol use 1.8 oz/week    3 Cans of beer per week     Comment: Occassionally  . Drug use: No  . Sexual activity: No   Other Topics Concern  . Not on file   Social History Narrative   Lives with wife.   epworth sleepiness scale = 5 (01/20/16)    Past Surgical History:  Procedure Laterality Date  . CHOLECYSTECTOMY  1983  . colonoscopy  2006, 2010  . INGUINAL HERNIA REPAIR  1960    Family History    Problem Relation Age of Onset  . Stroke Father 80    Died age 3  . Coronary artery disease Brother 21  . Colon cancer Neg Hx   . Colon polyps Neg Hx   . Diabetes Neg Hx   . Esophageal cancer Neg Hx   . Kidney disease Neg Hx   . Gallbladder disease Neg Hx     No Known Allergies  Current Outpatient Prescriptions on File Prior to Visit  Medication Sig Dispense Refill  . ACCU-CHEK AVIVA PLUS test strip USE TO TEST DAILY AS INSTRUCTED PER MD**EMERGENCY FILL MEDICAL REASON** 100 each 3  . aspirin 81 MG tablet Take 162 mg by mouth daily as needed for pain (pain).     Marland Kitchen atenolol-chlorthalidone (TENORETIC) 50-25 MG tablet Take 1 tablet by mouth daily. 30 tablet 5  . halobetasol (ULTRAVATE) 0.05 % cream Apply 1 application topically as directed.    . Lancets (ACCU-CHEK MULTICLIX) lancets USE TO TEST ONCE DAILY 102 each 3  . loratadine (CLARITIN) 10 MG tablet Take 10 mg by mouth daily as needed for allergies.     Marland Kitchen LORazepam (ATIVAN) 0.5 MG tablet TAKE 1 TABLET BY MOUTH TWICE A DAY AS NEEDED 60 tablet 0  .  metFORMIN (GLUCOPHAGE) 1000 MG tablet TAKE ONE TABLET BY MOUTH TWICE DAILY 180 tablet 3  . Multiple Vitamin (MULTIVITAMIN) tablet Take 1 tablet by mouth daily.    . mupirocin ointment (BACTROBAN) 2 % APPLY TO AFFECTED AREA TWICE DAILY AS NEEDED FOR CUTS OR SCRAPS  0  . potassium chloride SA (K-DUR,KLOR-CON) 20 MEQ tablet 2 tabs daily in the morning 270 tablet 1  . traMADol (ULTRAM) 50 MG tablet Take 50 mg by mouth as directed.    . TURMERIC PO Take 1 tablet by mouth daily as needed (FOR LEG CRAMPS).      No current facility-administered medications on file prior to visit.     BP 128/74   Temp 97.6 F (36.4 C) (Oral)   Ht 6' (1.829 m)   Wt 220 lb (99.8 kg)   BMI 29.84 kg/m       Objective:   Physical Exam  Constitutional: He is oriented to person, place, and time. He appears well-developed and well-nourished. No distress.  HENT:  Head: Normocephalic and atraumatic.  Right  Ear: External ear normal.  Left Ear: External ear normal.  Nose: Nose normal.  Mouth/Throat: Oropharynx is clear and moist. No oropharyngeal exudate.  Cardiovascular: Normal rate, regular rhythm, normal heart sounds and intact distal pulses.  Exam reveals no gallop and no friction rub.   No murmur heard. Pulmonary/Chest: Effort normal and breath sounds normal. No respiratory distress. He has no wheezes. He has no rales. He exhibits no tenderness.  Musculoskeletal: Normal range of motion. He exhibits no edema, tenderness or deformity.  Neurological: He is alert and oriented to person, place, and time. He has normal reflexes.  Skin: Skin is warm and dry. No rash noted. He is not diaphoretic. No erythema. No pallor.  Psychiatric: He has a normal mood and affect. His behavior is normal. Judgment and thought content normal.  Nursing note and vitals reviewed.     Assessment & Plan:  1. Uncontrolled type 2 diabetes mellitus without complication, without long-term current use of insulin (HCC) - POC HgB A1c- 5.8  - POC Glucose (CBG) - Will not change medication regimen at this time - Advised to cut back on carbs and sugars - Exercise as tolerated - Follow up in 3 months or sooner if needed  Tyrone Peng, NP

## 2016-02-07 ENCOUNTER — Telehealth (HOSPITAL_COMMUNITY): Payer: Self-pay

## 2016-02-07 NOTE — Telephone Encounter (Signed)
Encounter complete. 

## 2016-02-08 ENCOUNTER — Ambulatory Visit (HOSPITAL_COMMUNITY)
Admission: RE | Admit: 2016-02-08 | Discharge: 2016-02-08 | Disposition: A | Discharge status: Home / Self Care | Payer: Medicare Other | Source: Ambulatory Visit | Attending: Internal Medicine | Admitting: Internal Medicine

## 2016-02-08 DIAGNOSIS — I10 Essential (primary) hypertension: Secondary | ICD-10-CM | POA: Diagnosis not present

## 2016-02-08 DIAGNOSIS — I471 Supraventricular tachycardia: Secondary | ICD-10-CM | POA: Diagnosis not present

## 2016-02-08 DIAGNOSIS — E663 Overweight: Secondary | ICD-10-CM | POA: Diagnosis not present

## 2016-02-08 DIAGNOSIS — Z8249 Family history of ischemic heart disease and other diseases of the circulatory system: Secondary | ICD-10-CM | POA: Diagnosis not present

## 2016-02-08 DIAGNOSIS — R0609 Other forms of dyspnea: Secondary | ICD-10-CM | POA: Insufficient documentation

## 2016-02-08 DIAGNOSIS — R931 Abnormal findings on diagnostic imaging of heart and coronary circulation: Secondary | ICD-10-CM

## 2016-02-08 DIAGNOSIS — R0989 Other specified symptoms and signs involving the circulatory and respiratory systems: Secondary | ICD-10-CM

## 2016-02-08 DIAGNOSIS — Z9189 Other specified personal risk factors, not elsewhere classified: Secondary | ICD-10-CM | POA: Diagnosis not present

## 2016-02-08 DIAGNOSIS — Z6829 Body mass index (BMI) 29.0-29.9, adult: Secondary | ICD-10-CM | POA: Diagnosis not present

## 2016-02-08 DIAGNOSIS — I493 Ventricular premature depolarization: Secondary | ICD-10-CM | POA: Diagnosis not present

## 2016-02-08 DIAGNOSIS — Z87891 Personal history of nicotine dependence: Secondary | ICD-10-CM | POA: Diagnosis not present

## 2016-02-08 DIAGNOSIS — I110 Hypertensive heart disease with heart failure: Secondary | ICD-10-CM | POA: Diagnosis not present

## 2016-02-08 DIAGNOSIS — R6889 Other general symptoms and signs: Secondary | ICD-10-CM | POA: Insufficient documentation

## 2016-02-08 DIAGNOSIS — I2510 Atherosclerotic heart disease of native coronary artery without angina pectoris: Secondary | ICD-10-CM | POA: Diagnosis not present

## 2016-02-08 DIAGNOSIS — I5022 Chronic systolic (congestive) heart failure: Secondary | ICD-10-CM | POA: Diagnosis not present

## 2016-02-08 LAB — MYOCARDIAL PERFUSION IMAGING
Peak HR: 80 {beats}/min
Rest HR: 50 {beats}/min
SDS: 0
SRS: 1
SSS: 1
TID: 1.13

## 2016-02-08 MED ORDER — REGADENOSON 0.4 MG/5ML IV SOLN
0.4000 mg | Freq: Once | INTRAVENOUS | Status: AC
  Administered 2016-02-08: 0.4 mg via INTRAVENOUS

## 2016-02-08 MED ORDER — TECHNETIUM TC 99M TETROFOSMIN IV KIT
10.8000 | PACK | Freq: Once | INTRAVENOUS | Status: AC | PRN
  Administered 2016-02-08: 10.8 via INTRAVENOUS
  Filled 2016-02-08: qty 11

## 2016-02-08 MED ORDER — TECHNETIUM TC 99M TETROFOSMIN IV KIT
32.5000 | PACK | Freq: Once | INTRAVENOUS | Status: AC | PRN
  Administered 2016-02-08: 32.5 via INTRAVENOUS
  Filled 2016-02-08: qty 33

## 2016-02-10 DIAGNOSIS — M86271 Subacute osteomyelitis, right ankle and foot: Secondary | ICD-10-CM | POA: Diagnosis not present

## 2016-02-10 DIAGNOSIS — L97414 Non-pressure chronic ulcer of right heel and midfoot with necrosis of bone: Secondary | ICD-10-CM | POA: Diagnosis not present

## 2016-02-10 DIAGNOSIS — E11610 Type 2 diabetes mellitus with diabetic neuropathic arthropathy: Secondary | ICD-10-CM | POA: Diagnosis not present

## 2016-02-13 ENCOUNTER — Telehealth: Payer: Self-pay | Admitting: *Deleted

## 2016-02-13 DIAGNOSIS — E11610 Type 2 diabetes mellitus with diabetic neuropathic arthropathy: Secondary | ICD-10-CM | POA: Diagnosis not present

## 2016-02-13 DIAGNOSIS — L97421 Non-pressure chronic ulcer of left heel and midfoot limited to breakdown of skin: Secondary | ICD-10-CM | POA: Diagnosis not present

## 2016-02-13 DIAGNOSIS — L97411 Non-pressure chronic ulcer of right heel and midfoot limited to breakdown of skin: Secondary | ICD-10-CM | POA: Diagnosis not present

## 2016-02-13 NOTE — Telephone Encounter (Signed)
Follow up   Pt verbalized that he is calling to speak to rn to ask her some additional questions

## 2016-02-13 NOTE — Telephone Encounter (Signed)
Spoke with pt, aware of results and dr hilty's recommendations. The pt reports he will be going into the hospital in the next 1 to 2 weeks to have his foot amputated. He is not able to make any appointments at this time and does not know when he would be able to get here. He has been told his rehab after surgery will be lengthy. He is aware of his f/u with dr Debara Pickett in November and hopes he can make that appointment. Will make dr hilty aware.

## 2016-02-13 NOTE — Telephone Encounter (Signed)
Returned call. Patient notes concern about findings, esp in regards to EP consultation for the noted PVCs and SVT runs on stress test.  Asked questions about referral, which I answered. Notes he doesn't want this to delay his foot amputation, which is set up for next week - asked me if I could tell him whether this would affect his ability to have surgery. Informed patient I was not sure and would defer to provider. Patient aware I will return call w advice.

## 2016-02-13 NOTE — Telephone Encounter (Signed)
-----   Message from Pixie Casino, MD sent at 02/12/2016  6:30 PM EDT ----- Low risk stress test - no ischemia. LVEF on echo is reduced. Monitor shows recurrent frequent SVT and PVC's, despite increasing his B-blocker. Please refer to Dr. Curt Bears for an EP evaluation for frequent PVC's, SVT and non-ischemic cardiomyopathy (presumably associated with this).  Thanks.  Dr. Lemmie Evens

## 2016-02-14 NOTE — Telephone Encounter (Signed)
Not sure if he would have more rhythm problems with surgery or not, there is risk for that. Obviously, if he needs amputation it would probably be unwise to delay surgery. The b-blocker should provide some protection.  Dr. Lemmie Evens

## 2016-02-14 NOTE — Telephone Encounter (Signed)
Spoke w patient and gave advisements. He voiced understanding. He will speak w/ Dr. Sharol Given about this and have surgeon notify us if any concerns.

## 2016-02-23 ENCOUNTER — Other Ambulatory Visit (HOSPITAL_COMMUNITY): Payer: Self-pay | Admitting: Family

## 2016-02-28 ENCOUNTER — Encounter (HOSPITAL_COMMUNITY): Payer: Self-pay | Admitting: *Deleted

## 2016-02-28 NOTE — Progress Notes (Signed)
Mr Fickle has a couple drinks of Vodka on a daily basis, last hospitalization, patient reports being very anxious.I sent a note to Dr Sharol Given with that information.  Mr Buchman reports that his CBG's run 120- 150.  Last A1C was 5.8, drawn 02/06/16.I instructed patient to check CBG to check CBG and if it is less than 70 to treat it with Glucose Gel, Glucose tablets or 1/2 cup of clear juice like apple juice or cranberry juice, or 1/2 cup of regular soda. (not cream soda). I instructed patient to recheck CBG in 15 minutes and if CBG is not greater than 70, to  Call [phone] (pre- op). If it is before pre-op opens to retreat as before and recheck CBG in 15 minutes. I told patient to make note of time that liquid is taken and amount, that surgical time may have to be adjusted.  Mr Verhoff is requesting to [facility] to [facility], "I got my wife in there since she can't stay by herself and I have already talked to them about me."

## 2016-02-28 NOTE — Progress Notes (Signed)
Anesthesia Chart Review: SAME DAY WORK.  Patient is a 75 year old male scheduled for right BKA on 02/29/16 by Dr. Sharol Given.  History includes former smoker, HTN, CAD (mild) '08, rheumatic fever, DM2, recurrent nephrolithiasis, cholecystectomy. He has had known history of PVCs dating back to at least 2013 and had non-ischemic stress test at that time with Dr. Percival Spanish. More recently, he was referred back to cardiology (Dr. Debara Pickett) for intermittent SVT and PVCs during July hospitalization for sepsis related to diabetic foot ulcer. Work-up outlined below. He was started on medical therapy for cardiomyopathy (non-ischemic) with future plans for EP referral for further evaluation of PVCs, SVT which are presumably associated with this.  PCP is listed as Dr. Stevie Kern. Cardiologist is Dr. Lyman Bishop. Of note, following recent event monitor showing frequent runs of SVT, he recommended EP referral to Dr. Curt Bears. When patient was notified, he made Dr. Debara Pickett aware of surgery plans and concern about timing of when he could see EP and if results could effect upcoming surgery. On 02/13/16, Dr. Alain Honey wrote, "Not sure if he would have more rhythm problems with surgery or not, there is risk for that. Obviously, if he needs amputation it would probably be unwise to delay surgery. The b-blocker should provide some protection."   Meds include ASA, Tenoretic, metformin, KCl, tramadol, turmeric.  02/08/16 Nuclear stress test:  Overall Study Impression: Mocardial perfusion is normal. This is a low risk study. Study was not gated due to frequent PVCs.   8/0/17-01/20/16 Cardiac event monitor:  NSR Occasional PVCs Sinus bradycardia Frequent sustained runs of SVT with rates in the 150s.    12/11/15 Echo:Study Conclusions - Left ventricle: The cavity size was normal. Wall thickness was   increased in a pattern of mild LVH. Systolic function was mildly   reduced. The estimated ejection fraction was in the range of 45%   to  50%. Basal inferior akinesis, basal inferolateral hypokinesis.   Features are consistent with a pseudonormal left ventricular   filling pattern, with concomitant abnormal relaxation and   increased filling pressure (grade 2 diastolic dysfunction). - Aortic valve: There was no stenosis. There was trivial   regurgitation. - Mitral valve: There was no significant regurgitation. - Left atrium: The atrium was mildly dilated. - Right ventricle: The cavity size was normal. Systolic function   was normal. - Pulmonary arteries: No complete TR doppler jet so unable to   estimate PA systolic pressure. - Systemic veins: IVC measured 2.5 cm with < 50% respirophasic   variation, suggesting RA pressure 15 mmHg. Impressions: - Normal LV size with mild LV hypertrophy. EF 45-50%. Wall motion   abnormalities as noted above. Moderate diastolic dysfunction.   Normal RV size and systolic function. Dilated IVC suggesting   elevated RV filling pressure.  12/10/15 EKG: SR, incomplete right BBB.  06/17/06 Cardiac cath: ANGIOGRAPHIC DATA:  1. On plain fluoroscopy, there is moderate calcification of the left      anterior descending artery.  2. Ventriculography in the RAO projection reveals preserved global      systolic function without segmental wall motion abnormality.  3. The left main is free of critical disease and short.  4. The LAD courses to the apex and has calcification.  There is about      30% narrowing after takeoff of the major diagonal.  There is a fair      amount of luminal irregularity and fairly heavy calcification in      the mid portion  of the LAD.  However, critical focal stenosis is      not noted.  5. The circumflex provides a first marginal and then provides a large      second marginal.  The first marginal has about 30% ostial      narrowing, but no high-grade narrowing.  6. The right coronary artery also has mild luminal irregularity.      There is probably 30% narrowing in the  acute margin.  The PDA and      posterolateral system are without critical narrowing but have some      tapering to the distal vessels.  CONCLUSIONS:  1. Preserved overall left ventricular function.  2. No critical stenoses involving the left anterior descending,      circumflex or right coronary arteries as noted above.  DISPOSITION:  The patient will be treated medically.  Other sources of  chest pain may be sought by Dr. Stanford Breed.  12/10/15 CXR: FINDINGS: There is bilateral mild interstitial prominence and prominence of the central pulmonary vasculature. There is no focal parenchymal opacity. There is no pleural effusion or pneumothorax. The heart and mediastinal contours are stable. The osseous structures are unremarkable. IMPRESSION: Mild interstitial edema versus interstitial infection.  He is for labs on arrival. A1c 5.8 on 02/06/16.   Patient has pending EP evaluation, but primary cardiologist aware of surgery plans and concern for worsening infection if surgery is delayed. Patient is to continue his b-blocker, but still has some risk of recurrent arrhythmias. Consider post-operative telemetry.   George Hugh [facility] Short Stay Center/Anesthesiology Phone [phone] 02/28/2016 1:23 PM

## 2016-02-29 ENCOUNTER — Encounter (HOSPITAL_COMMUNITY)
Admission: RE | Disposition: A | Discharge status: Skilled Nursing Facility | Payer: Self-pay | Source: Ambulatory Visit | Attending: Orthopedic Surgery

## 2016-02-29 ENCOUNTER — Inpatient Hospital Stay (HOSPITAL_COMMUNITY)
Admission: RE | Admit: 2016-02-29 | Discharge: 2016-03-03 | DRG: 041 | Disposition: A | Discharge status: Skilled Nursing Facility | Payer: Medicare Other | Source: Ambulatory Visit | Attending: Orthopedic Surgery | Admitting: Orthopedic Surgery

## 2016-02-29 ENCOUNTER — Inpatient Hospital Stay (HOSPITAL_COMMUNITY): Payer: Medicare Other | Admitting: Vascular Surgery

## 2016-02-29 ENCOUNTER — Encounter (HOSPITAL_COMMUNITY): Payer: Self-pay | Admitting: *Deleted

## 2016-02-29 DIAGNOSIS — I10 Essential (primary) hypertension: Secondary | ICD-10-CM | POA: Diagnosis present

## 2016-02-29 DIAGNOSIS — C44300 Unspecified malignant neoplasm of skin of unspecified part of face: Secondary | ICD-10-CM | POA: Diagnosis not present

## 2016-02-29 DIAGNOSIS — G546 Phantom limb syndrome with pain: Secondary | ICD-10-CM | POA: Diagnosis not present

## 2016-02-29 DIAGNOSIS — I5022 Chronic systolic (congestive) heart failure: Secondary | ICD-10-CM | POA: Diagnosis not present

## 2016-02-29 DIAGNOSIS — I493 Ventricular premature depolarization: Secondary | ICD-10-CM | POA: Diagnosis not present

## 2016-02-29 DIAGNOSIS — I2510 Atherosclerotic heart disease of native coronary artery without angina pectoris: Secondary | ICD-10-CM | POA: Diagnosis present

## 2016-02-29 DIAGNOSIS — I70261 Atherosclerosis of native arteries of extremities with gangrene, right leg: Secondary | ICD-10-CM | POA: Diagnosis not present

## 2016-02-29 DIAGNOSIS — Z8249 Family history of ischemic heart disease and other diseases of the circulatory system: Secondary | ICD-10-CM | POA: Diagnosis not present

## 2016-02-29 DIAGNOSIS — Z87891 Personal history of nicotine dependence: Secondary | ICD-10-CM

## 2016-02-29 DIAGNOSIS — L97519 Non-pressure chronic ulcer of other part of right foot with unspecified severity: Secondary | ICD-10-CM | POA: Diagnosis present

## 2016-02-29 DIAGNOSIS — M86271 Subacute osteomyelitis, right ankle and foot: Secondary | ICD-10-CM | POA: Diagnosis not present

## 2016-02-29 DIAGNOSIS — R1310 Dysphagia, unspecified: Secondary | ICD-10-CM | POA: Diagnosis not present

## 2016-02-29 DIAGNOSIS — I471 Supraventricular tachycardia: Secondary | ICD-10-CM | POA: Diagnosis not present

## 2016-02-29 DIAGNOSIS — L97919 Non-pressure chronic ulcer of unspecified part of right lower leg with unspecified severity: Secondary | ICD-10-CM | POA: Diagnosis not present

## 2016-02-29 DIAGNOSIS — M869 Osteomyelitis, unspecified: Secondary | ICD-10-CM | POA: Diagnosis present

## 2016-02-29 DIAGNOSIS — E1169 Type 2 diabetes mellitus with other specified complication: Secondary | ICD-10-CM | POA: Diagnosis present

## 2016-02-29 DIAGNOSIS — S88019A Complete traumatic amputation at knee level, unspecified lower leg, initial encounter: Secondary | ICD-10-CM | POA: Diagnosis not present

## 2016-02-29 DIAGNOSIS — E108 Type 1 diabetes mellitus with unspecified complications: Secondary | ICD-10-CM | POA: Diagnosis not present

## 2016-02-29 DIAGNOSIS — Z87442 Personal history of urinary calculi: Secondary | ICD-10-CM | POA: Diagnosis not present

## 2016-02-29 DIAGNOSIS — I428 Other cardiomyopathies: Secondary | ICD-10-CM | POA: Diagnosis present

## 2016-02-29 DIAGNOSIS — Z823 Family history of stroke: Secondary | ICD-10-CM

## 2016-02-29 DIAGNOSIS — R278 Other lack of coordination: Secondary | ICD-10-CM | POA: Diagnosis not present

## 2016-02-29 DIAGNOSIS — E11621 Type 2 diabetes mellitus with foot ulcer: Secondary | ICD-10-CM | POA: Diagnosis present

## 2016-02-29 DIAGNOSIS — E1165 Type 2 diabetes mellitus with hyperglycemia: Secondary | ICD-10-CM | POA: Diagnosis not present

## 2016-02-29 DIAGNOSIS — L97411 Non-pressure chronic ulcer of right heel and midfoot limited to breakdown of skin: Secondary | ICD-10-CM | POA: Diagnosis not present

## 2016-02-29 DIAGNOSIS — Z89519 Acquired absence of unspecified leg below knee: Secondary | ICD-10-CM | POA: Diagnosis not present

## 2016-02-29 DIAGNOSIS — N200 Calculus of kidney: Secondary | ICD-10-CM | POA: Diagnosis not present

## 2016-02-29 DIAGNOSIS — L03115 Cellulitis of right lower limb: Secondary | ICD-10-CM | POA: Diagnosis present

## 2016-02-29 DIAGNOSIS — J309 Allergic rhinitis, unspecified: Secondary | ICD-10-CM | POA: Diagnosis not present

## 2016-02-29 DIAGNOSIS — M6281 Muscle weakness (generalized): Secondary | ICD-10-CM | POA: Diagnosis not present

## 2016-02-29 DIAGNOSIS — E11610 Type 2 diabetes mellitus with diabetic neuropathic arthropathy: Principal | ICD-10-CM | POA: Diagnosis present

## 2016-02-29 DIAGNOSIS — M1990 Unspecified osteoarthritis, unspecified site: Secondary | ICD-10-CM | POA: Diagnosis not present

## 2016-02-29 DIAGNOSIS — D1739 Benign lipomatous neoplasm of skin and subcutaneous tissue of other sites: Secondary | ICD-10-CM | POA: Diagnosis not present

## 2016-02-29 DIAGNOSIS — L97509 Non-pressure chronic ulcer of other part of unspecified foot with unspecified severity: Secondary | ICD-10-CM | POA: Diagnosis not present

## 2016-02-29 DIAGNOSIS — R2689 Other abnormalities of gait and mobility: Secondary | ICD-10-CM | POA: Diagnosis not present

## 2016-02-29 DIAGNOSIS — A419 Sepsis, unspecified organism: Secondary | ICD-10-CM | POA: Diagnosis not present

## 2016-02-29 HISTORY — DX: Other specified postprocedural states: Z98.890

## 2016-02-29 HISTORY — DX: Unspecified cataract: H26.9

## 2016-02-29 HISTORY — PX: AMPUTATION: SHX166

## 2016-02-29 HISTORY — DX: Adverse effect of unspecified anesthetic, initial encounter: T41.45XA

## 2016-02-29 HISTORY — DX: Nausea with vomiting, unspecified: R11.2

## 2016-02-29 HISTORY — DX: Other complications of anesthesia, initial encounter: T88.59XA

## 2016-02-29 LAB — COMPREHENSIVE METABOLIC PANEL
ALT: 18 U/L (ref 17–63)
AST: 22 U/L (ref 15–41)
Albumin: 4.3 g/dL (ref 3.5–5.0)
Alkaline Phosphatase: 68 U/L (ref 38–126)
Anion gap: 15 (ref 5–15)
BUN: 17 mg/dL (ref 6–20)
CO2: 24 mmol/L (ref 22–32)
Calcium: 10.1 mg/dL (ref 8.9–10.3)
Chloride: 99 mmol/L — ABNORMAL LOW (ref 101–111)
Creatinine, Ser: 0.86 mg/dL (ref 0.61–1.24)
GFR calc Af Amer: >60 mL/min (ref >60)
GFR calc non Af Amer: >60 mL/min (ref >60)
Glucose, Bld: 139 mg/dL — ABNORMAL HIGH (ref 65–99)
Potassium: 3.7 mmol/L (ref 3.5–5.1)
Sodium: 138 mmol/L (ref 135–145)
Total Bilirubin: 0.8 mg/dL (ref 0.3–1.2)
Total Protein: 7.8 g/dL (ref 6.5–8.1)

## 2016-02-29 LAB — GLUCOSE, CAPILLARY
Glucose-Capillary: 153 mg/dL — ABNORMAL HIGH (ref 65–99)
Glucose-Capillary: 126 mg/dL — ABNORMAL HIGH (ref 65–99)
Glucose-Capillary: 131 mg/dL — ABNORMAL HIGH (ref 65–99)
Glucose-Capillary: 144 mg/dL — ABNORMAL HIGH (ref 65–99)

## 2016-02-29 LAB — CBC
HCT: 44.4 % (ref 39.0–52.0)
Hemoglobin: 14.6 g/dL (ref 13.0–17.0)
MCH: 30.5 pg (ref 26.0–34.0)
MCHC: 32.9 g/dL (ref 30.0–36.0)
MCV: 92.9 fL (ref 78.0–100.0)
Platelets: 234 10*3/uL (ref 150–400)
RBC: 4.78 MIL/uL (ref 4.22–5.81)
RDW: 12.9 % (ref 11.5–15.5)
WBC: 11.5 10*3/uL — ABNORMAL HIGH (ref 4.0–10.5)

## 2016-02-29 SURGERY — AMPUTATION BELOW KNEE
Anesthesia: General | Site: Leg Lower | Laterality: Right

## 2016-02-29 MED ORDER — POTASSIUM CHLORIDE CRYS ER 20 MEQ PO TBCR
40.0000 meq | EXTENDED_RELEASE_TABLET | Freq: Every day | ORAL | Status: DC
  Administered 2016-02-29 – 2016-03-03 (×4): 40 meq via ORAL
  Filled 2016-02-29 (×4): qty 2

## 2016-02-29 MED ORDER — HYDROMORPHONE HCL 1 MG/ML IJ SOLN
1.0000 mg | INTRAMUSCULAR | Status: DC | PRN
  Administered 2016-02-29 – 2016-03-02 (×3): 1 mg via INTRAVENOUS
  Filled 2016-02-29 (×3): qty 1

## 2016-02-29 MED ORDER — ONDANSETRON HCL 4 MG PO TABS: 4.0000 mg | ORAL_TABLET | Freq: Four times a day (QID) | ORAL | Status: DC | PRN

## 2016-02-29 MED ORDER — MAGNESIUM CITRATE PO SOLN: 1.0000 | Freq: Once | ORAL | Status: DC | PRN

## 2016-02-29 MED ORDER — CEFAZOLIN SODIUM-DEXTROSE 2-4 GM/100ML-% IV SOLN
2.0000 g | INTRAVENOUS | Status: AC
  Administered 2016-02-29: 2 g via INTRAVENOUS
  Filled 2016-02-29: qty 100

## 2016-02-29 MED ORDER — HYDROMORPHONE HCL 1 MG/ML IJ SOLN
INTRAMUSCULAR | Status: AC
  Filled 2016-02-29: qty 1

## 2016-02-29 MED ORDER — LIDOCAINE HCL (CARDIAC) 20 MG/ML IV SOLN
INTRAVENOUS | Status: DC | PRN
  Administered 2016-02-29: 60 mg via INTRAVENOUS

## 2016-02-29 MED ORDER — ASPIRIN EC 325 MG PO TBEC
325.0000 mg | DELAYED_RELEASE_TABLET | Freq: Every day | ORAL | Status: DC
  Administered 2016-03-01 – 2016-03-03 (×3): 325 mg via ORAL
  Filled 2016-02-29 (×3): qty 1

## 2016-02-29 MED ORDER — POLYETHYLENE GLYCOL 3350 17 G PO PACK
17.0000 g | PACK | Freq: Every day | ORAL | Status: DC | PRN
  Filled 2016-02-29: qty 1

## 2016-02-29 MED ORDER — OXYCODONE HCL 5 MG/5ML PO SOLN: 5.0000 mg | Freq: Once | ORAL | Status: AC | PRN

## 2016-02-29 MED ORDER — SODIUM CHLORIDE 0.9 % IV SOLN
INTRAVENOUS | Status: DC
  Administered 2016-02-29: 15:00:00 via INTRAVENOUS

## 2016-02-29 MED ORDER — 0.9 % SODIUM CHLORIDE (POUR BTL) OPTIME
TOPICAL | Status: DC | PRN
  Administered 2016-02-29: 1000 mL

## 2016-02-29 MED ORDER — METOCLOPRAMIDE HCL 5 MG/ML IJ SOLN: 5.0000 mg | Freq: Three times a day (TID) | INTRAMUSCULAR | Status: DC | PRN

## 2016-02-29 MED ORDER — ATENOLOL-CHLORTHALIDONE 50-25 MG PO TABS: 1.0000 | ORAL_TABLET | Freq: Every day | ORAL | Status: DC

## 2016-02-29 MED ORDER — INSULIN ASPART 100 UNIT/ML ~~LOC~~ SOLN
0.0000 [IU] | Freq: Three times a day (TID) | SUBCUTANEOUS | Status: DC
  Administered 2016-02-29 – 2016-03-01 (×3): 2 [IU] via SUBCUTANEOUS
  Administered 2016-03-02: 3 [IU] via SUBCUTANEOUS
  Administered 2016-03-02 – 2016-03-03 (×4): 2 [IU] via SUBCUTANEOUS

## 2016-02-29 MED ORDER — LACTATED RINGERS IV SOLN
INTRAVENOUS | Status: DC
  Administered 2016-02-29 (×2): via INTRAVENOUS

## 2016-02-29 MED ORDER — DOCUSATE SODIUM 100 MG PO CAPS
100.0000 mg | ORAL_CAPSULE | Freq: Two times a day (BID) | ORAL | Status: DC
  Administered 2016-02-29 – 2016-03-02 (×5): 100 mg via ORAL
  Filled 2016-02-29 (×7): qty 1

## 2016-02-29 MED ORDER — FENTANYL CITRATE (PF) 100 MCG/2ML IJ SOLN
INTRAMUSCULAR | Status: AC
  Filled 2016-02-29: qty 4

## 2016-02-29 MED ORDER — ONDANSETRON HCL 4 MG/2ML IJ SOLN: 4.0000 mg | Freq: Four times a day (QID) | INTRAMUSCULAR | Status: DC | PRN

## 2016-02-29 MED ORDER — OXYCODONE HCL 5 MG PO TABS
ORAL_TABLET | ORAL | Status: AC
  Filled 2016-02-29: qty 1

## 2016-02-29 MED ORDER — HYDROMORPHONE HCL 1 MG/ML IJ SOLN
0.2500 mg | INTRAMUSCULAR | Status: DC | PRN
  Administered 2016-02-29 (×4): 0.5 mg via INTRAVENOUS

## 2016-02-29 MED ORDER — CEFAZOLIN IN D5W 1 GM/50ML IV SOLN
1.0000 g | Freq: Four times a day (QID) | INTRAVENOUS | Status: AC
  Administered 2016-02-29 – 2016-03-01 (×3): 1 g via INTRAVENOUS
  Filled 2016-02-29 (×3): qty 50

## 2016-02-29 MED ORDER — METHOCARBAMOL 500 MG PO TABS
500.0000 mg | ORAL_TABLET | Freq: Four times a day (QID) | ORAL | Status: DC | PRN
  Administered 2016-02-29 – 2016-03-01 (×4): 500 mg via ORAL
  Filled 2016-02-29 (×3): qty 1

## 2016-02-29 MED ORDER — METHOCARBAMOL 500 MG PO TABS
ORAL_TABLET | ORAL | Status: AC
  Filled 2016-02-29: qty 1

## 2016-02-29 MED ORDER — LORAZEPAM 0.5 MG PO TABS
0.5000 mg | ORAL_TABLET | Freq: Two times a day (BID) | ORAL | Status: DC | PRN
  Administered 2016-02-29 – 2016-03-03 (×4): 0.5 mg via ORAL
  Filled 2016-02-29 (×5): qty 1

## 2016-02-29 MED ORDER — ONDANSETRON HCL 4 MG/2ML IJ SOLN
INTRAMUSCULAR | Status: DC | PRN
  Administered 2016-02-29: 4 mg via INTRAVENOUS

## 2016-02-29 MED ORDER — ACETAMINOPHEN 650 MG RE SUPP: 650.0000 mg | Freq: Four times a day (QID) | RECTAL | Status: DC | PRN

## 2016-02-29 MED ORDER — METHOCARBAMOL 1000 MG/10ML IJ SOLN
500.0000 mg | Freq: Four times a day (QID) | INTRAVENOUS | Status: DC | PRN
  Filled 2016-02-29: qty 5

## 2016-02-29 MED ORDER — OXYCODONE HCL 5 MG PO TABS
5.0000 mg | ORAL_TABLET | ORAL | Status: DC | PRN
  Administered 2016-02-29 – 2016-03-03 (×12): 10 mg via ORAL
  Filled 2016-02-29 (×12): qty 2

## 2016-02-29 MED ORDER — METFORMIN HCL 500 MG PO TABS: 1000.0000 mg | ORAL_TABLET | Freq: Two times a day (BID) | ORAL | Status: DC

## 2016-02-29 MED ORDER — PROPOFOL 10 MG/ML IV BOLUS
INTRAVENOUS | Status: DC | PRN
  Administered 2016-02-29: 160 mg via INTRAVENOUS

## 2016-02-29 MED ORDER — PHENYLEPHRINE HCL 10 MG/ML IJ SOLN
INTRAMUSCULAR | Status: DC | PRN
  Administered 2016-02-29: 80 ug via INTRAVENOUS

## 2016-02-29 MED ORDER — MIDAZOLAM HCL 5 MG/5ML IJ SOLN
INTRAMUSCULAR | Status: DC | PRN
  Administered 2016-02-29: 2 mg via INTRAVENOUS

## 2016-02-29 MED ORDER — MIDAZOLAM HCL 2 MG/2ML IJ SOLN
INTRAMUSCULAR | Status: AC
  Filled 2016-02-29: qty 2

## 2016-02-29 MED ORDER — OXYCODONE HCL 5 MG PO TABS
5.0000 mg | ORAL_TABLET | Freq: Once | ORAL | Status: AC | PRN
  Administered 2016-02-29: 5 mg via ORAL

## 2016-02-29 MED ORDER — ROCURONIUM BROMIDE 10 MG/ML (PF) SYRINGE
PREFILLED_SYRINGE | INTRAVENOUS | Status: AC
  Filled 2016-02-29: qty 10

## 2016-02-29 MED ORDER — ACETAMINOPHEN 325 MG PO TABS: 650.0000 mg | ORAL_TABLET | Freq: Four times a day (QID) | ORAL | Status: DC | PRN

## 2016-02-29 MED ORDER — METFORMIN HCL 500 MG PO TABS
1000.0000 mg | ORAL_TABLET | Freq: Two times a day (BID) | ORAL | Status: DC
  Administered 2016-02-29 – 2016-03-03 (×6): 1000 mg via ORAL
  Filled 2016-02-29 (×5): qty 2

## 2016-02-29 MED ORDER — CHLORHEXIDINE GLUCONATE 4 % EX LIQD: 60.0000 mL | Freq: Once | CUTANEOUS | Status: DC

## 2016-02-29 MED ORDER — METOCLOPRAMIDE HCL 5 MG PO TABS: 5.0000 mg | ORAL_TABLET | Freq: Three times a day (TID) | ORAL | Status: DC | PRN

## 2016-02-29 MED ORDER — PROPOFOL 10 MG/ML IV BOLUS
INTRAVENOUS | Status: AC
  Filled 2016-02-29: qty 20

## 2016-02-29 MED ORDER — FENTANYL CITRATE (PF) 100 MCG/2ML IJ SOLN
INTRAMUSCULAR | Status: DC | PRN
  Administered 2016-02-29 (×4): 50 ug via INTRAVENOUS

## 2016-02-29 MED ORDER — HYDROCHLOROTHIAZIDE 25 MG PO TABS
25.0000 mg | ORAL_TABLET | Freq: Every day | ORAL | Status: DC
  Administered 2016-02-29 – 2016-03-03 (×4): 25 mg via ORAL
  Filled 2016-02-29 (×4): qty 1

## 2016-02-29 MED ORDER — ATENOLOL 50 MG PO TABS
50.0000 mg | ORAL_TABLET | Freq: Every day | ORAL | Status: DC
  Administered 2016-02-29 – 2016-03-03 (×4): 50 mg via ORAL
  Filled 2016-02-29 (×4): qty 1

## 2016-02-29 MED ORDER — PHENYLEPHRINE 40 MCG/ML (10ML) SYRINGE FOR IV PUSH (FOR BLOOD PRESSURE SUPPORT)
PREFILLED_SYRINGE | INTRAVENOUS | Status: AC
  Filled 2016-02-29: qty 10

## 2016-02-29 SURGICAL SUPPLY — 42 items
BLADE SAW RECIP 87.9 MT (BLADE) ×2 IMPLANT
BLADE SURG 21 STRL SS (BLADE) ×2 IMPLANT
BNDG COHESIVE 6X5 TAN STRL LF (GAUZE/BANDAGES/DRESSINGS) ×3 IMPLANT
BNDG GAUZE ELAST 4 BULKY (GAUZE/BANDAGES/DRESSINGS) ×3 IMPLANT
COVER SURGICAL LIGHT HANDLE (MISCELLANEOUS) ×4 IMPLANT
CUFF TOURNIQUET SINGLE 34IN LL (TOURNIQUET CUFF) IMPLANT
CUFF TOURNIQUET SINGLE 44IN (TOURNIQUET CUFF) IMPLANT
DRAPE EXTREMITY T 121X128X90 (DRAPE) ×2 IMPLANT
DRAPE INCISE IOBAN 66X45 STRL (DRAPES) IMPLANT
DRAPE PROXIMA HALF (DRAPES) ×2 IMPLANT
DRAPE U-SHAPE 47X51 STRL (DRAPES) ×2 IMPLANT
DRSG ADAPTIC 3X8 NADH LF (GAUZE/BANDAGES/DRESSINGS) ×2 IMPLANT
DRSG PAD ABDOMINAL 8X10 ST (GAUZE/BANDAGES/DRESSINGS) ×2 IMPLANT
DRSG VAC ATS MED SENSATRAC (GAUZE/BANDAGES/DRESSINGS) ×1 IMPLANT
DURAPREP 26ML APPLICATOR (WOUND CARE) ×2 IMPLANT
ELECT REM PT RETURN 9FT ADLT (ELECTROSURGICAL) ×2
ELECTRODE REM PT RTRN 9FT ADLT (ELECTROSURGICAL) ×1 IMPLANT
GAUZE SPONGE 4X4 12PLY STRL (GAUZE/BANDAGES/DRESSINGS) ×2 IMPLANT
GLOVE BIO SURGEON STRL SZ 6.5 (GLOVE) ×1 IMPLANT
GLOVE BIOGEL PI IND STRL 6.5 (GLOVE) IMPLANT
GLOVE BIOGEL PI IND STRL 9 (GLOVE) ×1 IMPLANT
GLOVE BIOGEL PI INDICATOR 6.5 (GLOVE) ×2
GLOVE BIOGEL PI INDICATOR 9 (GLOVE) ×1
GLOVE SKINSENSE NS SZ6.5 (GLOVE) ×2
GLOVE SKINSENSE STRL SZ6.5 (GLOVE) IMPLANT
GLOVE SURG ORTHO 9.0 STRL STRW (GLOVE) ×2 IMPLANT
GOWN STRL REUS W/ TWL XL LVL3 (GOWN DISPOSABLE) ×2 IMPLANT
GOWN STRL REUS W/TWL XL LVL3 (GOWN DISPOSABLE) ×4
KIT BASIN OR (CUSTOM PROCEDURE TRAY) ×2 IMPLANT
KIT ROOM TURNOVER OR (KITS) ×2 IMPLANT
MANIFOLD NEPTUNE II (INSTRUMENTS) ×2 IMPLANT
NS IRRIG 1000ML POUR BTL (IV SOLUTION) ×2 IMPLANT
PACK GENERAL/GYN (CUSTOM PROCEDURE TRAY) ×2 IMPLANT
PAD ARMBOARD 7.5X6 YLW CONV (MISCELLANEOUS) ×2 IMPLANT
SPONGE GAUZE 4X4 12PLY STER LF (GAUZE/BANDAGES/DRESSINGS) ×1 IMPLANT
SPONGE LAP 18X18 X RAY DECT (DISPOSABLE) IMPLANT
STAPLER VISISTAT 35W (STAPLE) IMPLANT
STOCKINETTE IMPERVIOUS LG (DRAPES) ×2 IMPLANT
SUT SILK 2 0 (SUTURE) ×2
SUT SILK 2-0 18XBRD TIE 12 (SUTURE) ×1 IMPLANT
SUT VIC AB 1 CTX 27 (SUTURE) ×2 IMPLANT
TOWEL OR 17X26 10 PK STRL BLUE (TOWEL DISPOSABLE) ×2 IMPLANT

## 2016-02-29 NOTE — Anesthesia Preprocedure Evaluation (Signed)
Anesthesia Evaluation  Patient identified by MRN, date of birth, ID band Patient awake    Reviewed: Allergy & Precautions, NPO status , Patient's Chart, lab work & pertinent test results  History of Anesthesia Complications (+) PONV  Airway Mallampati: II   Neck ROM: full    Dental   Pulmonary asthma , former smoker,    breath sounds clear to auscultation       Cardiovascular hypertension,  Rhythm:regular Rate:Normal     Neuro/Psych PSYCHIATRIC DISORDERS Anxiety    GI/Hepatic   Endo/Other  diabetes, Type 2  Renal/GU      Musculoskeletal  (+) Arthritis ,   Abdominal   Peds  Hematology   Anesthesia Other Findings   Reproductive/Obstetrics                             Anesthesia Physical Anesthesia Plan  ASA: III  Anesthesia Plan: General   Post-op Pain Management:    Induction: Intravenous  Airway Management Planned: LMA  Additional Equipment:   Intra-op Plan:   Post-operative Plan:   Informed Consent: I have reviewed the patients History and Physical, chart, labs and discussed the procedure including the risks, benefits and alternatives for the proposed anesthesia with the patient or authorized representative who has indicated his/her understanding and acceptance.     Plan Discussed with: CRNA, Anesthesiologist and Surgeon  Anesthesia Plan Comments:         Anesthesia Quick Evaluation

## 2016-02-29 NOTE — Op Note (Signed)
   Date of Surgery: 02/29/2016  INDICATIONS: [name] is a 74 y.o.-year-old male who has diabetic insensate neuropathy Charcot collapse of the right foot who has failed conservative foot salvage intervention has osteomyelitis and a chronic ulcer presents at this time for transtibial amputation.Marland Kitchen  PREOPERATIVE DIAGNOSIS: Osteomyelitis ulceration Charcot collapse right foot  POSTOPERATIVE DIAGNOSIS: Same.  PROCEDURE: Transtibial amputation Application of Prevena wound VAC  SURGEON: Sharol Given, M.D.  ANESTHESIA:  general  IV FLUIDS AND URINE: See anesthesia.  ESTIMATED BLOOD LOSS: Minimal mL.  COMPLICATIONS: None.  DESCRIPTION OF PROCEDURE: The patient was brought to the operating room and underwent a general anesthetic. After adequate levels of anesthesia were obtained patient's lower extremity was prepped using DuraPrep draped into a sterile field. A timeout was called. The foot was draped out of the sterile field with impervious stockinette. A transverse incision was made 11 cm distal to the tibial tubercle. This curved proximally and a large posterior flap was created. The tibia was transected 1 cm proximal to the skin incision. The fibula was transected just proximal to the tibial incision. The tibia was beveled anteriorly. A large posterior flap was created. The sciatic nerve was pulled cut and allowed to retract. The vascular bundles were suture ligated with 2-0 silk. The deep and superficial fascial layers were closed using #1 Vicryl. The skin was closed using staples and 2-0 nylon. The wound was covered with a Prevena wound VAC. There was a good suction fit. A prosthetic shrinker was applied. Patient was extubated taken to the PACU in stable condition.  Meridee Score, MD [place] 10:40 AM

## 2016-02-29 NOTE — H&P (Signed)
[name] is an 75 y.o. male.   Chief Complaint: Osteomyelitis ulceration right foot HPI: Patient is a 75 year old gentleman with diabetic insensate neuropathy who has failed limb salvage intervention and presents at this time for transtibial amputation on the right.  Past Medical History:  Diagnosis Date  . CAD (coronary artery disease)    Minimal plaque cath 2008  . Cataract    both  . Complication of anesthesia   . Diabetes mellitus   . ED (erectile dysfunction)   . Hypertension   . Osteoarthritis   . PONV (postoperative nausea and vomiting) 1960   with Ether  . Rheumatic fever   . Rhinitis     Past Surgical History:  Procedure Laterality Date  . CHOLECYSTECTOMY  1983  . colonoscopy  2006, 2010  . INGUINAL HERNIA REPAIR Right 1960    Family History  Problem Relation Age of Onset  . Stroke Father 4    Died age 55  . Coronary artery disease Brother 49  . Colon cancer Neg Hx   . Colon polyps Neg Hx   . Diabetes Neg Hx   . Esophageal cancer Neg Hx   . Kidney disease Neg Hx   . Gallbladder disease Neg Hx    Social History:  reports that he quit smoking about 34 years ago. He has never used smokeless tobacco. He reports that he drinks about 8.4 oz of alcohol per week . He reports that he does not use drugs.  Allergies:  Allergies  Allergen Reactions  . No Known Allergies     No prescriptions prior to admission.    No results found for this or any previous visit (from the past 48 hour(s)). No results found.  Review of Systems  All other systems reviewed and are negative.   There were no vitals taken for this visit. Physical Exam  On examination patient is alert oriented no adenopathy well-dressed normal affect normal respiratory effort he has an antalgic gait he has ulceration osteomyelitis cellulitis of the right foot. Assessment/Plan Assessment: Diabetic insensate neuropathy with osteomyelitis ulceration right foot.  Plan: We'll plan for a right  transtibial amputation. Risk and benefits were discussed including risk of the wound not healing need for additional surgery. Patient states he understands and wishes to proceed at this time.  [name], MD 02/29/2016, 6:36 AM

## 2016-02-29 NOTE — Transfer of Care (Signed)
Immediate Anesthesia Transfer of Care Note  Patient: [name]  Procedure(s) Performed: Procedure(s): Right Leg AMPUTATION BELOW KNEE with Wound Vac placement (Right)  Patient Location: PACU  Anesthesia Type:General  Level of Consciousness: awake, alert , oriented and patient cooperative  Airway & Oxygen Therapy: Patient Spontanous Breathing  Post-op Assessment: Report given to RN and Post -op Vital signs reviewed and stable  Post vital signs: Reviewed and stable  Last Vitals:  Vitals:   02/29/16 0753  BP: (!) 172/98  Pulse: (!) 115  Resp: 18  Temp: 36.4 C    Last Pain:  Vitals:   02/29/16 0753  TempSrc: Oral         Complications: No apparent anesthesia complications

## 2016-02-29 NOTE — Anesthesia Procedure Notes (Signed)
Procedure Name: LMA Insertion Date/Time: 02/29/2016 10:05 AM Performed by: Lance Coon Pre-anesthesia Checklist: Patient identified, Emergency Drugs available, Suction available, Timeout performed and Patient being monitored Patient Re-evaluated:Patient Re-evaluated prior to inductionOxygen Delivery Method: Circle system utilized Preoxygenation: Pre-oxygenation with 100% oxygen Intubation Type: IV induction LMA: LMA inserted LMA Size: 5.0 Number of attempts: 1 Airway Equipment and Method: Stylet Placement Confirmation: positive ETCO2 and breath sounds checked- equal and bilateral Tube secured with: Tape Dental Injury: Teeth and Oropharynx as per pre-operative assessment

## 2016-03-01 ENCOUNTER — Encounter (HOSPITAL_COMMUNITY): Payer: Self-pay | Admitting: Orthopedic Surgery

## 2016-03-01 LAB — GLUCOSE, CAPILLARY
Glucose-Capillary: 145 mg/dL — ABNORMAL HIGH (ref 65–99)
Glucose-Capillary: 133 mg/dL — ABNORMAL HIGH (ref 65–99)
Glucose-Capillary: 134 mg/dL — ABNORMAL HIGH (ref 65–99)
Glucose-Capillary: 130 mg/dL — ABNORMAL HIGH (ref 65–99)

## 2016-03-01 NOTE — Clinical Social Work Note (Signed)
Clinical Social Work Assessment  Patient Details  Name: [name] MRN: [id number] Date of Birth: [birth year]  Date of referral:  03/01/16               Reason for consult:  Facility Placement                Permission sought to share information with:  Chartered certified accountant granted to share information::  Yes, Verbal Permission Granted  Name::      Hinton Dyer)  Agency::   ([facility])  Relationship::   (daughter)  Sport and exercise psychologist Information:     Housing/Transportation Living arrangements for the past 2 months:  [place] of Information:  Patient Patient Interpreter Needed:  None Criminal Activity/Legal Involvement Pertinent to Current Situation/Hospitalization:  No - Comment as needed Significant Relationships:  Adult Children Lives with:  Facility Resident Do you feel safe going back to the place where you live?  Yes Need for family participation in patient care:  Yes (Comment)  Care giving concerns:  No caregivers present at time of assessment.   Social Worker assessment / plan:  CSW spoke with patient regarding disposition.  Patient states he is from [facility] and will return once medically stable.  Patient will need STR and will need 3 qualifying midnights prior to dc.  Patient understands insurance procedures.  No concerns presented.  Employment status:  Retired Forensic scientist:  [employer] PT Recommendations:  [place] / Referral to community resources:  [place]  Patient/Family's Response to care:  Patient is agreeable to SNF  Patient/Family's Understanding of and Emotional Response to Diagnosis, Current Treatment, and Prognosis:  Patient states he is being discharged " as close to home as he can get" and was relieved to be able to use the STR part of the facility.  CSW offered support during this transition.  Emotional Assessment Appearance:  Appears older than stated  age Attitude/Demeanor/Rapport:    Affect (typically observed):  Accepting, Adaptable Orientation:  Oriented to Self, Oriented to Place, Oriented to  Time, Oriented to Situation Alcohol / Substance use:  Not Applicable Psych involvement (Current and /or in the community):  No (Comment)  Discharge Needs  Concerns to be addressed:  Care Coordination Readmission within the last 30 days:  No Current discharge risk:    Barriers to Discharge:  Continued Medical Work up   [employer], LCSW 03/01/2016, 1:14 PM

## 2016-03-01 NOTE — Progress Notes (Signed)
Patient ID: [name], male   DOB: 18-Sep-1940, 75 y.o.   MRN: [id number] Postoperative day 1 transtibial amputation. Patient does complain of phantom pain. The wound VAC is functioning well. Anticipate discharge to skilled nursing on Saturday.

## 2016-03-01 NOTE — Clinical Social Work Note (Addendum)
Patient is from [facility] and will return at time of discharge.  Patient projected to dc over the weekend.  The weekend number for [place] is [id number] [place], MSW, [place]  [phone]  Licensed Clinical Social Worker

## 2016-03-01 NOTE — Evaluation (Signed)
Physical Therapy Evaluation Patient Details Name: [name] MRN: [id number] DOB: 13-Jun-1940 Today's Date: 03/01/2016   History of Present Illness  Pt is a 75 y.o. male now s/p Rt BKA due to osteomyelitis. PMH: diabetes, rheumatic fever, HTN.   Clinical Impression  Pt is having difficulty with mobility, requiring +2 mod assistance with sit/stand transfers. Based upon the patient's current mobility level, anticipate that the pt will D/C to SNF for further rehabilitation.     Follow Up Recommendations SNF;Supervision for mobility/OOB    Equipment Recommendations   (to be addressed at next venue)    Recommendations for Other Services       Precautions / Restrictions Precautions Precautions: Fall Restrictions Weight Bearing Restrictions: Yes RLE Weight Bearing: Non weight bearing      Mobility  Bed Mobility Overal bed mobility: Needs Assistance Bed Mobility: Supine to Sit     Supine to sit: Min guard;HOB elevated     General bed mobility comments: with use of rail  Transfers Overall transfer level: Needs assistance Equipment used: Rolling walker (2 wheeled) Transfers: Sit to/from Stand Sit to Stand: +2 physical assistance;Mod assist         General transfer comment: sit<>stand performed from bed X3 and BSC X1. Pt unable to safety attempt pivot transfer, BSC/chair brought from behind while standing. Pt having difficulty with hip extension and pt unable to achieve full erect standing position despite multimodal cues.   Ambulation/Gait                Stairs            Wheelchair Mobility    Modified Rankin (Stroke Patients Only)       Balance Overall balance assessment: Needs assistance Sitting-balance support: No upper extremity supported Sitting balance-Leahy Scale: Good     Standing balance support: Bilateral upper extremity supported Standing balance-Leahy Scale: Poor Standing balance comment: requiring rw and +2 assist.                               Pertinent Vitals/Pain Pain Assessment: 0-10 Pain Score: 5  Pain Location: Rt LE Pain Descriptors / Indicators: Aching Pain Intervention(s): Limited activity within patient's tolerance;Monitored during session    Home Living Family/patient expects to be discharged to:: Skilled nursing facility                      Prior Function Level of Independence: Independent with assistive device(s)         Comments: reports using a cane for ambulation     Hand Dominance        Extremity/Trunk Assessment   Upper Extremity Assessment: Generalized weakness           Lower Extremity Assessment: Generalized weakness RLE Deficits / Details: able to raise Rt LE       Communication   Communication: No difficulties  Cognition Arousal/Alertness: Awake/alert Behavior During Therapy: WFL for tasks assessed/performed Overall Cognitive Status: Within Functional Limits for tasks assessed                      General Comments General comments (skin integrity, edema, etc.): reviewed need for knee extension    Exercises     Assessment/Plan    PT Assessment Patient needs continued PT services  PT Problem List Decreased strength;Decreased range of motion;Decreased activity tolerance;Decreased balance;Decreased mobility          PT  Treatment Interventions DME instruction;Gait training;Functional mobility training;Therapeutic activities;Therapeutic exercise;Balance training;Patient/family education    PT Goals (Current goals can be found in the Care Plan section)  Acute Rehab PT Goals Patient Stated Goal: be able to go somewhere to get more therapy.  PT Goal Formulation: With patient Time For Goal Achievement: 03/15/16 Potential to Achieve Goals: Fair    Frequency Min 2X/week   Barriers to discharge        Co-evaluation               End of Session Equipment Utilized During Treatment: Gait belt Activity Tolerance: Patient  limited by fatigue Patient left: in chair;with call bell/phone within reach (in Rt knee extension) Nurse Communication: Mobility status         Time: [id number] PT Time Calculation (min) (ACUTE ONLY): 35 min   Charges:   PT Evaluation $PT Eval Moderate Complexity: 1 Procedure PT Treatments $Therapeutic Activity: 8-22 mins   PT G Codes:        Cassell Clement, PT, CSCS Pager [phone] Office [phone]  03/01/2016, 10:22 AM

## 2016-03-01 NOTE — NC FL2 (Signed)
[place] LEVEL OF CARE SCREENING TOOL     IDENTIFICATION  Patient Name: [name] Birthdate: [birth year] Sex: male Admission Date (Current Location): 02/29/2016  [facility] and [place] Number:  Herbalist and Address:  [facility] [place], [place] [address], [place], [place] [address]      Provider Number: [id number]  Attending Physician Name and Address:  [name], MD  Relative Name and Phone Number:       Current Level of Care: Hospital Recommended Level of Care: [place] Prior Approval Number:    Date Approved/Denied:   PASRR Number: [id number]  Discharge Plan: SNF    Current Diagnoses: Patient Active Problem List   Diagnosis Date Noted  . Below knee amputation status ([place]) 02/29/2016  . SVT (supraventricular tachycardia) ([place]) 01/20/2016  . Chronic systolic heart failure ([place]) 01/20/2016  . Sepsis ([place]) 12/10/2015  . Diabetic foot ulcers ([place]) 07/12/2015  . Nausea with vomiting   . Dysphagia 08/06/2014  . Vomiting 08/05/2014  . Gastroenteritis presumed infectious 08/05/2014  . Anxiety state 10/26/2013  . Cellulitis of fourth toe of right foot 10/24/2013  . Acute thoracic back pain 10/09/2012  . PVC (premature ventricular contraction) 07/27/2011  . Chest pain at rest 07/10/2011  . Diabetes mellitus type II, uncontrolled ([place]) 11/02/2010  . ASTHMA NOS W/ACUTE EXACERBATION 06/06/2010  . BASAL CELL CARCINOMA, FACE 10/18/2008  . BASAL CELL CARCINOMA, FACE 10/18/2008  . LIPOMA, SKIN 05/31/2008  . DRY SKIN 05/31/2008  . ERECTILE DYSFUNCTION 09/30/2007  . OSTEOARTHRITIS 08/08/2007  . Essential hypertension 03/24/2007  . ALLERGIC RHINITIS 03/24/2007    Orientation RESPIRATION BLADDER Height & Weight     Self, Time, Situation, Place    Continent Weight:   Height:     BEHAVIORAL SYMPTOMS/MOOD NEUROLOGICAL BOWEL NUTRITION STATUS      Continent Diet (Carb modified)  AMBULATORY STATUS COMMUNICATION OF  NEEDS Skin   Limited Assist Verbally Surgical wounds                       Personal Care Assistance Level of Assistance  Bathing, Dressing Bathing Assistance: Limited assistance   Dressing Assistance: Limited assistance     Functional Limitations Info             SPECIAL CARE FACTORS FREQUENCY  PT (By licensed PT)     PT Frequency: daily OT Frequency: daily            Contractures      Additional Factors Info                  Current Medications (03/01/2016):  This is the current hospital active medication list Current Facility-Administered Medications  Medication Dose Route Frequency Provider Last Rate Last Dose  . 0.9 %  sodium chloride infusion   Intravenous Continuous [name], MD 10 mL/hr at 03/01/16 0600    . acetaminophen (TYLENOL) tablet 650 mg  650 mg Oral Q6H PRN [name], MD       Or  . acetaminophen (TYLENOL) suppository 650 mg  650 mg Rectal Q6H PRN [name], MD      . aspirin EC tablet 325 mg  325 mg Oral Daily [name], MD   325 mg at 03/01/16 0841  . atenolol (TENORMIN) tablet 50 mg  50 mg Oral Daily [name], MD   50 mg at 03/01/16 0840  . docusate sodium (COLACE) capsule 100  mg  100 mg Oral BID [name], MD   100 mg at 03/01/16 0840  . hydrochlorothiazide (HYDRODIURIL) tablet 25 mg  25 mg Oral Daily [name], MD   25 mg at 03/01/16 0840  . HYDROmorphone (DILAUDID) injection 1 mg  1 mg Intravenous Q2H PRN [name], MD   1 mg at 03/01/16 0036  . insulin aspart (novoLOG) injection 0-15 Units  0-15 Units Subcutaneous TID WC [name], MD   2 Units at 03/01/16 [phone]  . LORazepam (ATIVAN) tablet 0.5 mg  0.5 mg Oral BID PRN [name], MD   0.5 mg at 03/01/16 [id number]  . magnesium citrate solution 1 Bottle  1 Bottle Oral Once PRN [name], MD      . metFORMIN (GLUCOPHAGE) tablet 1,000 mg  1,000 mg Oral BID WC [name], MD   1,000 mg at 03/01/16 0839  . methocarbamol (ROBAXIN) tablet 500 mg  500  mg Oral Q6H PRN [name], MD   500 mg at 03/01/16 0431   Or  . methocarbamol (ROBAXIN) 500 mg in dextrose 5 % 50 mL IVPB  500 mg Intravenous Q6H PRN [name], MD      . metoCLOPramide (REGLAN) tablet 5-10 mg  5-10 mg Oral Q8H PRN [name], MD       Or  . metoCLOPramide (REGLAN) injection 5-10 mg  5-10 mg Intravenous Q8H PRN [name], MD      . ondansetron [facility]) tablet 4 mg  4 mg Oral Q6H PRN [name], MD       Or  . ondansetron [facility]) injection 4 mg  4 mg Intravenous Q6H PRN [name], MD      . oxyCODONE (Oxy IR/ROXICODONE) immediate release tablet 5-10 mg  5-10 mg Oral Q3H PRN [name], MD   10 mg at 03/01/16 0839  . polyethylene glycol (MIRALAX / GLYCOLAX) packet 17 g  17 g Oral Daily PRN [name], MD      . potassium chloride SA (K-DUR,KLOR-CON) CR tablet 40 mEq  40 mEq Oral Daily [name], MD   40 mEq at 03/01/16 0840     Discharge Medications: Please see discharge summary for a list of discharge medications.  Relevant Imaging Results:  Relevant Lab Results:   Additional Information SSN: [id number]  Dulcy Fanny, LCSW

## 2016-03-01 NOTE — Anesthesia Postprocedure Evaluation (Signed)
Anesthesia Post Note  Patient: [name]  Procedure(s) Performed: Procedure(s) (LRB): Right Leg AMPUTATION BELOW KNEE with Wound Vac placement (Right)  Patient location during evaluation: PACU Anesthesia Type: General Level of consciousness: awake and alert and patient cooperative Pain management: pain level controlled Vital Signs Assessment: post-procedure vital signs reviewed and stable Respiratory status: spontaneous breathing and respiratory function stable Cardiovascular status: stable Anesthetic complications: no    Last Vitals:  Vitals:   03/01/16 0010 03/01/16 0357  BP: 138/64 (!) 146/74  Pulse: (!) 59 60  Resp: 18 16  Temp: 36.8 C 36.6 C    Last Pain:  Vitals:   03/01/16 0431  TempSrc:   PainSc: [place]

## 2016-03-01 NOTE — Progress Notes (Signed)
PT progress note  Second session performed for assisting pt from chair to bed. Multiple attempts made for sit/stand transfers with +2 assistance (mod-max). When attempting to move out from behind pt, he repeatedly sat down reporting that he was afraid he was going to fall. Heavy verbal and tactile cues utilized with poor improvement in pt performance. For pt and staff safety, maximove utilized to return pt back to bed. PT continuing to recommend SNF for further rehabilitation following acute stay.      03/01/16 1337  PT Visit Information  Last PT Received On 03/01/16  Assistance Needed +2  History of Present Illness Pt is a 75 y.o. male now s/p Rt BKA due to osteomyelitis. PMH: diabetes, rheumatic fever, HTN.   Subjective Data  Subjective Pt reporting that he just can't stand. Expressing fear of falling.   Patient Stated Goal get back to bed.  Precautions  Precautions Fall  Restrictions  Weight Bearing Restrictions Yes  RLE Weight Bearing NWB  Pain Assessment  Pain Assessment Faces  Faces Pain Scale 4  Pain Location Rt LE  Pain Descriptors / Indicators Guarding  Pain Intervention(s) Limited activity within patient's tolerance;Monitored during session  Cognition  Arousal/Alertness Awake/alert  Behavior During Therapy Anxious  Overall Cognitive Status Within Functional Limits for tasks assessed  Transfers  Overall transfer level Needs assistance  Equipment used Rolling walker (2 wheeled)  Transfer via [place] to/from Stand  Sit to Stand +2 physical assistance;Mod assist;Max assist (variable from mod to max assistance needed. )  General transfer comment Sit<>stand performed X4 with +2 assistance. Once standing pt becoming fearful of falling and repeatedly attempting to sit down. Attempting to assist pt to stand and exchange chair with bed however pt attempting to sit once attemping to remove chair. Due to safety concerns for pt and staff, during final  stand maximove pad was placed under pt. Pt transferred from chiar to bed via maximove.   Balance  Overall balance assessment Needs assistance  Sitting-balance support No upper extremity supported  Sitting balance-Leahy Scale Good  Standing balance support Bilateral upper extremity supported  Standing balance-Leahy Scale Poor  Standing balance comment requiring rw and physical assistance  PT - End of Session  Equipment Utilized During Treatment Gait belt  Activity Tolerance (pt anxious about falling, limiting mobility. )  Patient left in bed;with call bell/phone within reach;with nursing/sitter in room (left in care of nursing)  Nurse Communication Mobility status  PT - Assessment/Plan  PT Plan Current plan remains appropriate  PT Frequency (ACUTE ONLY) Min 2X/week  Follow Up Recommendations SNF;Supervision for mobility/OOB  PT equipment None recommended by PT  PT Goal Progression  Progress towards PT goals Progressing toward goals  Acute Rehab PT Goals  PT Goal Formulation With patient  Time For Goal Achievement 03/15/16  Potential to Achieve Goals Fair  PT Time Calculation  PT Start Time (ACUTE ONLY) 1149  PT Stop Time (ACUTE ONLY) 1210  PT Time Calculation (min) (ACUTE ONLY) 21 min  PT General Charges  $$ ACUTE PT VISIT 1 Procedure  PT Treatments  $Therapeutic Activity 8-22 mins  Cassell Clement, PT, CSCS Pager [phone] Office [phone]

## 2016-03-02 LAB — GLUCOSE, CAPILLARY
Glucose-Capillary: 135 mg/dL — ABNORMAL HIGH (ref 65–99)
Glucose-Capillary: 152 mg/dL — ABNORMAL HIGH (ref 65–99)
Glucose-Capillary: 131 mg/dL — ABNORMAL HIGH (ref 65–99)
Glucose-Capillary: 121 mg/dL — ABNORMAL HIGH (ref 65–99)

## 2016-03-02 MED ORDER — DOCUSATE SODIUM 100 MG PO CAPS: 100.0000 mg | ORAL_CAPSULE | Freq: Two times a day (BID) | ORAL | Status: DC

## 2016-03-02 MED ORDER — SENNOSIDES-DOCUSATE SODIUM 8.6-50 MG PO TABS: 1.0000 | ORAL_TABLET | Freq: Every evening | ORAL | Status: DC | PRN

## 2016-03-02 MED ORDER — MAGNESIUM CITRATE PO SOLN
1.0000 | Freq: Once | ORAL | Status: AC
  Administered 2016-03-02: 1 via ORAL
  Filled 2016-03-02: qty 296

## 2016-03-02 NOTE — Clinical Social Work Note (Addendum)
SNF needs to know ASAP what type of equipment the patient needs so that they can order it. The RN had paged MD to find out and has not heard back yet.  Dayton Scrape, CSW [phone]  1:29 pm CSW called MD who stated that patient needed a prevena wound vac at SNF. CSW called and left voicemail for DON at SNF.  Dayton Scrape, [place]

## 2016-03-02 NOTE — Progress Notes (Signed)
Patient ID: [name], male   DOB: November 10, 1940, 75 y.o.   MRN: [id number] Patient is not making progress with therapy. He will need discharge to skilled nursing. Anticipate discharge to skilled nursing tomorrow Saturday. Wound VAC is functioning well no drainage at this time.

## 2016-03-02 NOTE — Progress Notes (Signed)
Physical Therapy Treatment Patient Details Name: [name] MRN: [id number] DOB: 21-Jun-1940 Today's Date: 03/02/2016    History of Present Illness Pt is a 75 y.o. male now s/p Rt BKA due to osteomyelitis. PMH: diabetes, rheumatic fever, HTN.     PT Comments    Pt with better carryover during sit to stand transfers.  PTA used stedy frame to instill confidence and progress transfer training.  PTA educated staff on need for stedy for back to bed transfer.  Pt would continue to benefit from rehab in acute setting to build confidence in transfers and improve strength.    Follow Up Recommendations  SNF;Supervision for mobility/OOB     Equipment Recommendations  None recommended by PT    Recommendations for Other Services       Precautions / Restrictions Precautions Precautions: Fall Restrictions Weight Bearing Restrictions: Yes RLE Weight Bearing: Non weight bearing    Mobility  Bed Mobility Overal bed mobility: Needs Assistance Bed Mobility: Supine to Sit     Supine to sit: Min guard     General bed mobility comments: with use of rail, cues for hand placement and to scoot to edge of surface of bed in prep for transfer.    Transfers Overall transfer level: Needs assistance Equipment used: None Transfers: Sit to/from Stand Sit to Stand: Mod assist         General transfer comment: Pt performed sit to stand from elevated height of bed with +1 mod assist.  Pt in standing required cues for residual limb placement and upper trunk control.  Pt anxioys in standing but as he stood anxiety subsided.  Performed stand to sit to stedy plates and transferred patient to recliner chair for supper.  Pt required mod assist for eccentric loading from stedy frame to recliner.    Ambulation/Gait Ambulation/Gait assistance:  (unable.  )               Stairs            Wheelchair Mobility    Modified Rankin (Stroke Patients Only)       Balance     Sitting  balance-Leahy Scale: Fair       Standing balance-Leahy Scale: Poor                      Cognition Arousal/Alertness: Awake/alert Behavior During Therapy: Anxious;WFL for tasks assessed/performed Overall Cognitive Status: Within Functional Limits for tasks assessed                      Exercises      General Comments        Pertinent Vitals/Pain Pain Assessment: Faces Faces Pain Scale: Hurts little more Pain Location: R LE Pain Descriptors / Indicators: Operative site guarding;Tender;Sore Pain Intervention(s): Repositioned    Home Living                      Prior Function            PT Goals (current goals can now be found in the care plan section) Acute Rehab PT Goals Patient Stated Goal: to get in chair for supper.   Potential to Achieve Goals: Fair Progress towards PT goals: Progressing toward goals    Frequency    Min 2X/week      PT Plan Current plan remains appropriate    Co-evaluation             End of Session  Equipment Utilized During Treatment: Gait belt   Patient left: in chair;with call bell/phone within reach;with chair alarm set     Time: [phone] PT Time Calculation (min) (ACUTE ONLY): 23 min  Charges:  $Therapeutic Activity: 23-37 mins                    G Codes:      Cristela Blue Mar 25, 2016, 5:35 PM  Governor Rooks, PTA pager [phone]

## 2016-03-03 DIAGNOSIS — E1165 Type 2 diabetes mellitus with hyperglycemia: Secondary | ICD-10-CM | POA: Diagnosis not present

## 2016-03-03 DIAGNOSIS — G546 Phantom limb syndrome with pain: Secondary | ICD-10-CM | POA: Diagnosis not present

## 2016-03-03 DIAGNOSIS — I493 Ventricular premature depolarization: Secondary | ICD-10-CM | POA: Diagnosis not present

## 2016-03-03 DIAGNOSIS — Z89519 Acquired absence of unspecified leg below knee: Secondary | ICD-10-CM | POA: Diagnosis not present

## 2016-03-03 DIAGNOSIS — A419 Sepsis, unspecified organism: Secondary | ICD-10-CM | POA: Diagnosis not present

## 2016-03-03 DIAGNOSIS — S88019A Complete traumatic amputation at knee level, unspecified lower leg, initial encounter: Secondary | ICD-10-CM | POA: Diagnosis not present

## 2016-03-03 DIAGNOSIS — I471 Supraventricular tachycardia: Secondary | ICD-10-CM | POA: Diagnosis not present

## 2016-03-03 DIAGNOSIS — R1310 Dysphagia, unspecified: Secondary | ICD-10-CM | POA: Diagnosis not present

## 2016-03-03 DIAGNOSIS — E1100 Type 2 diabetes mellitus with hyperosmolarity without nonketotic hyperglycemic-hyperosmolar coma (NKHHC): Secondary | ICD-10-CM | POA: Diagnosis not present

## 2016-03-03 DIAGNOSIS — I5022 Chronic systolic (congestive) heart failure: Secondary | ICD-10-CM | POA: Diagnosis not present

## 2016-03-03 DIAGNOSIS — L740 Miliaria rubra: Secondary | ICD-10-CM | POA: Diagnosis not present

## 2016-03-03 DIAGNOSIS — C44300 Unspecified malignant neoplasm of skin of unspecified part of face: Secondary | ICD-10-CM | POA: Diagnosis not present

## 2016-03-03 DIAGNOSIS — I10 Essential (primary) hypertension: Secondary | ICD-10-CM | POA: Diagnosis not present

## 2016-03-03 DIAGNOSIS — I2510 Atherosclerotic heart disease of native coronary artery without angina pectoris: Secondary | ICD-10-CM | POA: Diagnosis not present

## 2016-03-03 DIAGNOSIS — M6281 Muscle weakness (generalized): Secondary | ICD-10-CM | POA: Diagnosis not present

## 2016-03-03 DIAGNOSIS — E108 Type 1 diabetes mellitus with unspecified complications: Secondary | ICD-10-CM | POA: Diagnosis not present

## 2016-03-03 DIAGNOSIS — N200 Calculus of kidney: Secondary | ICD-10-CM | POA: Diagnosis not present

## 2016-03-03 DIAGNOSIS — R2689 Other abnormalities of gait and mobility: Secondary | ICD-10-CM | POA: Diagnosis not present

## 2016-03-03 DIAGNOSIS — M1990 Unspecified osteoarthritis, unspecified site: Secondary | ICD-10-CM | POA: Diagnosis not present

## 2016-03-03 DIAGNOSIS — F411 Generalized anxiety disorder: Secondary | ICD-10-CM | POA: Diagnosis not present

## 2016-03-03 DIAGNOSIS — Z89511 Acquired absence of right leg below knee: Secondary | ICD-10-CM | POA: Diagnosis not present

## 2016-03-03 DIAGNOSIS — D1739 Benign lipomatous neoplasm of skin and subcutaneous tissue of other sites: Secondary | ICD-10-CM | POA: Diagnosis not present

## 2016-03-03 DIAGNOSIS — R278 Other lack of coordination: Secondary | ICD-10-CM | POA: Diagnosis not present

## 2016-03-03 DIAGNOSIS — L97509 Non-pressure chronic ulcer of other part of unspecified foot with unspecified severity: Secondary | ICD-10-CM | POA: Diagnosis not present

## 2016-03-03 DIAGNOSIS — J309 Allergic rhinitis, unspecified: Secondary | ICD-10-CM | POA: Diagnosis not present

## 2016-03-03 DIAGNOSIS — Z89512 Acquired absence of left leg below knee: Secondary | ICD-10-CM | POA: Diagnosis not present

## 2016-03-03 DIAGNOSIS — M869 Osteomyelitis, unspecified: Secondary | ICD-10-CM | POA: Diagnosis not present

## 2016-03-03 LAB — GLUCOSE, CAPILLARY
Glucose-Capillary: 125 mg/dL — ABNORMAL HIGH (ref 65–99)
Glucose-Capillary: 131 mg/dL — ABNORMAL HIGH (ref 65–99)

## 2016-03-03 MED ORDER — HYDROCODONE-ACETAMINOPHEN 5-325 MG PO TABS: 1.0000 | ORAL_TABLET | Freq: Four times a day (QID) | ORAL | 0 refills | Status: DC | PRN

## 2016-03-03 NOTE — Clinical Social Work Psych Assess (Signed)
CSW notified that pt medically stable to discharge back to [facility] contacted SNF and confirmed that pt may discharge to SNF at 1 or 2pm today. CSW contacted [facility] and arranged transport for this afternoon and requested that RN call SNF and give report.

## 2016-03-03 NOTE — Progress Notes (Signed)
RN called [facility] to give report. Facility unaware of patient acceptance. RN will follow up with  Education officer, museum.

## 2016-03-03 NOTE — Progress Notes (Signed)
[facility] called and transportation arranged. Will continue to monitor

## 2016-03-03 NOTE — Discharge Summary (Signed)
Discharge Diagnoses:  Active Problems:   Below knee amputation status ([place])   Surgeries: Procedure(s): Right Leg AMPUTATION BELOW KNEE with Wound Vac placement on 02/29/2016    Consultants:  none  Discharged Condition: Improved  Hospital Course: [name] is an 75 y.o. male who was admitted 02/29/2016 with a chief complaint of ostomy myelitis ulceration right foot, and found to have a diagnosis of Osteomyelitis Right Foot.  They were brought to the operating room on 02/29/2016 and underwent Procedure(s): Right Leg AMPUTATION BELOW KNEE with Wound Vac placement.    They were given perioperative antibiotics: Anti-infectives    Start     Dose/Rate Route Frequency Ordered Stop   02/29/16 1600  ceFAZolin (ANCEF) IVPB 1 g/50 mL premix     1 g 100 mL/hr over 30 Minutes Intravenous Every 6 hours 02/29/16 1339 03/01/16 0419   02/29/16 0731  ceFAZolin (ANCEF) IVPB 2g/100 mL premix     2 g 200 mL/hr over 30 Minutes Intravenous On call to O.R. 02/29/16 [id number] 02/29/16 1009    .  They were given sequential compression devices, early ambulation, and Aspirin for DVT prophylaxis.  Recent vital signs: Patient Vitals for the past 24 hrs:  BP Temp Temp src Pulse Resp SpO2  03/03/16 0539 (!) 120/57 98.2 F (36.8 C) Oral 72 16 97 %  03/02/16 2045 127/73 98.3 F (36.8 C) Oral 74 18 95 %  03/02/16 1423 (!) 142/66 98.7 F (37.1 C) Oral 72 18 97 %  .  Recent laboratory studies: No results found.  Discharge Medications:     Medication List    STOP taking these medications   doxycycline 100 MG capsule Commonly known as:  VIBRAMYCIN   mupirocin ointment 2 % Commonly known as:  BACTROBAN     TAKE these medications   ACCU-CHEK AVIVA PLUS test strip Generic drug:  glucose blood USE TO TEST DAILY AS INSTRUCTED PER MD**EMERGENCY FILL MEDICAL REASON**   accu-chek multiclix lancets USE TO TEST ONCE DAILY   aspirin EC 325 MG tablet Take 325 mg by mouth every evening. What changed:  Another  medication with the same name was removed. Continue taking this medication, and follow the directions you see here.   atenolol-chlorthalidone 50-25 MG tablet Commonly known as:  TENORETIC Take 1 tablet by mouth daily.   halobetasol 0.05 % cream Commonly known as:  ULTRAVATE Apply 1 application topically 3 (three) times daily as needed (for skin irritation).   HYDROcodone-acetaminophen 5-325 MG tablet Commonly known as:  NORCO Take 1 tablet by mouth every 6 (six) hours as needed.   loratadine 10 MG tablet Commonly known as:  CLARITIN Take 10 mg by mouth daily as needed for allergies.   LORazepam 0.5 MG tablet Commonly known as:  ATIVAN TAKE 1 TABLET BY MOUTH TWICE A DAY AS NEEDED What changed:  See the new instructions.   metFORMIN 1000 MG tablet Commonly known as:  GLUCOPHAGE TAKE ONE TABLET BY MOUTH TWICE DAILY What changed:  See the new instructions.   multivitamin tablet Take 1 tablet by mouth daily.   potassium chloride SA 20 MEQ tablet Commonly known as:  K-DUR,KLOR-CON 2 tabs daily in the morning What changed:  how much to take  how to take this  when to take this  additional instructions   traMADol 50 MG tablet Commonly known as:  ULTRAM Take 50 mg by mouth 2 (two) times daily as needed (for pain.).   TURMERIC PO Take 1 tablet by mouth daily  as needed (FOR LEG CRAMPS).       Diagnostic Studies: No results found.  They benefited maximally from their hospital stay and there were no complications.     Disposition: 01-Home or Self Care Discharge Instructions    Call MD / Call 911    Complete by:  As directed    If you experience chest pain or shortness of breath, CALL 911 and be transported to the hospital emergency room.  If you develope a fever above 101 F, pus (white drainage) or increased drainage or redness at the wound, or calf pain, call your surgeon's office.   Change dressing    Complete by:  As directed    At time of discharge, remove the  wound VAC dressing and sponge, apply the medical stump shrinker directly to the wound. The stump shrinker may be changed as needed.   Constipation Prevention    Complete by:  As directed    Drink plenty of fluids.  Prune juice may be helpful.  You may use a stool softener, such as Colace (over the counter) 100 mg twice a day.  Use MiraLax (over the counter) for constipation as needed.   Diet - low sodium heart healthy    Complete by:  As directed    Increase activity slowly as tolerated    Complete by:  As directed    Non weight bearing    Complete by:  As directed    Laterality:  right   Extremity:  Lower      Contact information for follow-up providers    [name], MD Follow up in 1 week(s).   Specialty:  Orthopedic Surgery Contact information: [place] [phone]            Contact information for after-discharge care    Destination    [facility] .   Specialties:  [place] Contact information: [address] [place] [phone]                   Signed: [name] 03/03/2016, 8:03 AM

## 2016-03-03 NOTE — Progress Notes (Signed)
[name] to be D/C'd Skilled nursing facility per MD order.  Discussed with the patient and all questions fully answered.  VSS, Skin clean, dry and intact without evidence of skin break down, no evidence of skin tears noted. IV catheter discontinued intact. Site without signs and symptoms of complications. Dressing and pressure applied.  An After Visit Summary was printed and given to the patient. Patient received prescription.  D/c education completed with patient/family including follow up instructions, medication list, d/c activities limitations if indicated, with other d/c instructions as indicated by MD - patient able to verbalize understanding, all questions fully answered.   Patient instructed to return to ED, call 911, or call MD for any changes in condition.   Patient escorted via [facility].  Jerry Caras 03/03/2016 6:09 PM

## 2016-03-03 NOTE — Progress Notes (Signed)
Orthopedic Tech Progress Note Patient Details:  [name] 03-15-1941 [id number]  Patient ID: [name], male   DOB: Aug 04, 1940, 75 y.o.   MRN: [id number]   [name] 03/03/2016, 12:55 PMCalled [employer] for Stump shriinker.

## 2016-03-03 NOTE — Progress Notes (Signed)
PT Cancellation Note  Patient Details Name: [name] MRN: [id number] DOB: Oct 28, 1940   Cancelled Treatment:    Reason Eval/Treat Not Completed: Patient declined, no reason specifiedPt declined due to pain/fatigue and had just went to restroom with nursing staff assistance. Pt declined performing therex. Pt given HEP handout and reviewed. PT will continue to follow acutely.    Salina April, PTA Pager: [phone]   03/03/2016, 2:15 PM

## 2016-03-06 ENCOUNTER — Non-Acute Institutional Stay (SKILLED_NURSING_FACILITY): Payer: Medicare Other | Admitting: Nurse Practitioner

## 2016-03-06 ENCOUNTER — Encounter: Payer: Self-pay | Admitting: Nurse Practitioner

## 2016-03-06 DIAGNOSIS — G546 Phantom limb syndrome with pain: Secondary | ICD-10-CM | POA: Diagnosis not present

## 2016-03-06 DIAGNOSIS — Z89511 Acquired absence of right leg below knee: Secondary | ICD-10-CM

## 2016-03-06 DIAGNOSIS — L740 Miliaria rubra: Secondary | ICD-10-CM

## 2016-03-06 DIAGNOSIS — I5022 Chronic systolic (congestive) heart failure: Secondary | ICD-10-CM

## 2016-03-06 DIAGNOSIS — F411 Generalized anxiety disorder: Secondary | ICD-10-CM | POA: Diagnosis not present

## 2016-03-06 DIAGNOSIS — Z89512 Acquired absence of left leg below knee: Secondary | ICD-10-CM

## 2016-03-06 DIAGNOSIS — I10 Essential (primary) hypertension: Secondary | ICD-10-CM

## 2016-03-06 DIAGNOSIS — E1100 Type 2 diabetes mellitus with hyperosmolarity without nonketotic hyperglycemic-hyperosmolar coma (NKHHC): Secondary | ICD-10-CM

## 2016-03-06 NOTE — Assessment & Plan Note (Signed)
Compensated clinically, continue Atenolol, Chlorthalidone

## 2016-03-06 NOTE — Assessment & Plan Note (Signed)
The right, f/u Ortho.

## 2016-03-06 NOTE — Assessment & Plan Note (Signed)
Continue Lorazepam 0.5mg  bid prn.

## 2016-03-06 NOTE — Assessment & Plan Note (Signed)
Blood pressure is controlled, continue Atenolol 50mg  qd, Chlorthalidone 25mg , update CMP, TSH

## 2016-03-06 NOTE — Assessment & Plan Note (Addendum)
Continue Metformin 1000mg  bid, last Hgb a1c 5.8 02/06/16

## 2016-03-06 NOTE — Assessment & Plan Note (Signed)
The right below knee amputation, continue prn Norco, Tramadol, and Gabapentin 100mg  bid.

## 2016-03-06 NOTE — Progress Notes (Signed)
Location:  [facility] Room Number: 35 Place of Service:  SNF (31) Provider: Maisy Newport, [place]  NP  [name], MD  Patient Care Team: [name], MD as PCP - General (Internal Medicine)  Extended Emergency Contact Information Primary Emergency Contact: [name] Address: [address], [place] [address] [place] Phone: [phone] Relation: Daughter  Code Status:  Full Code Goals of care: Advanced Directive information Advanced Directives 03/06/2016  Does patient have an advance directive? Yes  Type of Paramedic of [place];Living will  Does patient want to make changes to advanced directive? No - Patient declined  Copy of advanced directive(s) in chart? Yes  Would patient like information on creating an advanced directive? -     Chief Complaint  Patient presents with  . Acute Visit    Rash on buttocks, back and left foot. anxiety,anxious    HPI:  Pt is a 75 y.o. male seen today for an acute visit for heat rash lower back and buttocks, f/u the right below knee amputation, 02/29/16 to 03/03/16 the right below knee amputation as resultant of right foot osteomyelitis.    Hx of controlled T2DM, takes Metformin 1000mg  bid, blood pressure is controlled on Atenolol, Chlorthalidone. Right leg phantom pain is well managed with Gabapentin, prn Norco and Tramadol. He takes Lorazepam prn for his anxiety. He stated he slept well, is getting his appetite back.  Past Medical History:  Diagnosis Date  . CAD (coronary artery disease)    Minimal plaque cath 2008  . Cataract    both  . Complication of anesthesia   . Diabetes mellitus   . ED (erectile dysfunction)   . Hypertension   . Osteoarthritis   . PONV (postoperative nausea and vomiting) 1960   with Ether  . Rheumatic fever   . Rhinitis    Past Surgical History:  Procedure Laterality Date  . AMPUTATION Right 02/29/2016   Procedure: Right  Leg AMPUTATION BELOW KNEE with Wound Vac placement;  Surgeon: [name], MD;  Location: [facility];  Service: Orthopedics;  Laterality: Right;  . CHOLECYSTECTOMY  1983  . colonoscopy  2006, 2010  . INGUINAL HERNIA REPAIR Right 1960    Allergies  Allergen Reactions  . No Known Allergies       Medication List       Accurate as of 03/06/16 12:53 PM. Always use your most recent med list.          ACCU-CHEK AVIVA PLUS test strip Generic drug:  glucose blood USE TO TEST DAILY AS INSTRUCTED PER MD**EMERGENCY FILL MEDICAL REASON**   accu-chek multiclix lancets USE TO TEST ONCE DAILY   aspirin EC 325 MG tablet Take 325 mg by mouth every evening.   atenolol 50 MG tablet Commonly known as:  TENORMIN Take 50 mg by mouth daily.   chlorthalidone 25 MG tablet Commonly known as:  HYGROTON Take 25 mg by mouth daily.   halobetasol 0.05 % cream Commonly known as:  ULTRAVATE Apply 1 application topically 3 (three) times daily as needed (for skin irritation).   HYDROcodone-acetaminophen 5-325 MG tablet Commonly known as:  NORCO Take 1 tablet by mouth every 6 (six) hours as needed.   loratadine 10 MG tablet Commonly known as:  CLARITIN Take 10 mg by mouth daily as needed for allergies.   LORazepam 0.5 MG tablet Commonly known as:  ATIVAN TAKE 1 TABLET BY  MOUTH TWICE A DAY AS NEEDED   metFORMIN 1000 MG tablet Commonly known as:  GLUCOPHAGE TAKE ONE TABLET BY MOUTH TWICE DAILY   multivitamin tablet Take 1 tablet by mouth daily.   potassium chloride SA 20 MEQ tablet Commonly known as:  K-DUR,KLOR-CON 2 tabs daily in the morning   traMADol 50 MG tablet Commonly known as:  ULTRAM Take 50 mg by mouth 2 (two) times daily as needed (for pain.).   TURMERIC PO Take 1 tablet by mouth daily as needed (FOR LEG CRAMPS).       Review of Systems  Constitutional: Negative for activity change, appetite change, chills, diaphoresis, fatigue and fever.  HENT: Negative for congestion,  facial swelling, hearing loss, mouth sores and nosebleeds.   Eyes: Negative for photophobia, pain, discharge, redness and itching.  Respiratory: Negative for apnea, cough, choking, chest tightness, shortness of breath and wheezing.   Cardiovascular: Negative for chest pain, palpitations and leg swelling.  Gastrointestinal: Negative for abdominal distention, abdominal pain, anal bleeding, blood in stool, constipation, diarrhea and nausea.  Endocrine: Negative for cold intolerance, heat intolerance, polydipsia, polyphagia and polyuria.       Hx of T2DM  Genitourinary: Negative for dysuria, flank pain, frequency, hematuria and urgency.  Musculoskeletal: Positive for gait problem and myalgias. Negative for arthralgias, back pain, joint swelling and neck stiffness.       S/p below knee amputation  Skin: Positive for color change, rash and wound. Negative for pallor.       The right below knee amputation, surgical incision intact with staples in place, mild erythema and swelling at the surgical area, no bleed or drainage seen.  Heat rash lower back and buttocks.   Allergic/Immunologic: Negative for environmental allergies, food allergies and immunocompromised state.  Neurological: Negative for dizziness, tremors, seizures, syncope, facial asymmetry, speech difficulty, light-headedness and numbness.  Hematological: Negative for adenopathy. Does not bruise/bleed easily.  Psychiatric/Behavioral: Negative.     Immunization History  Administered Date(s) Administered  . Influenza Split 04/10/2011  . Influenza Whole 02/25/2009  . Influenza-Unspecified 02/25/2013, 02/26/2015  . Pneumococcal Polysaccharide-23 05/28/2005  . Td 05/28/2006  . Varicella 04/08/2012   Pertinent  Health Maintenance Due  Topic Date Due  . PNA vac Low Risk Adult (2 of 2 - PCV13) 05/28/2006  . OPHTHALMOLOGY EXAM  07/01/2015  . INFLUENZA VACCINE  12/27/2015  . FOOT EXAM  07/11/2016  . URINE MICROALBUMIN  07/11/2016  .  HEMOGLOBIN A1C  08/05/2016   Fall Risk  07/12/2015 03/01/2015 10/26/2013 01/13/2013  Falls in the past year? No No No No   Functional Status Survey:    Vitals:   03/06/16 1204  BP: 122/78  Pulse: 62  Resp: 20  Temp: 97.3 F (36.3 C)  SpO2: 97%  Weight: 205 lb 3.2 oz (93.1 kg)  Height: 6' (1.829 m)   Body mass index is 27.83 kg/m. Physical Exam  Constitutional: He is oriented to person, place, and time. He appears well-developed and well-nourished. No distress.  HENT:  Head: Normocephalic and atraumatic.  Right Ear: External ear normal.  Left Ear: External ear normal.  Nose: Nose normal.  Mouth/Throat: Oropharynx is clear and moist. No oropharyngeal exudate.  Eyes: Conjunctivae and EOM are normal. Pupils are equal, round, and reactive to light.  Neck: Normal range of motion. Neck supple.  Cardiovascular: Normal rate, regular rhythm, normal heart sounds and intact distal pulses.  Exam reveals no gallop and no friction rub.   No murmur heard. Pulmonary/Chest: Effort normal and  breath sounds normal. No respiratory distress. He has no wheezes. He has no rales. He exhibits no tenderness.  Abdominal: He exhibits no distension. There is no tenderness. There is no rebound and no guarding.  Musculoskeletal: Normal range of motion. He exhibits edema. He exhibits no tenderness or deformity.  Mild swelling at the surgical site of the right below knee amputation.   Neurological: He is alert and oriented to person, place, and time. He has normal reflexes.  Skin: Skin is warm and dry. Rash noted. He is not diaphoretic. There is erythema. No pallor.  The right below knee amputation, surgical incision intact with staples in place, mild erythema and swelling at the surgical area, no bleed or drainage seen.  Heat rash lower back and buttocks.    Psychiatric: He has a normal mood and affect. His behavior is normal. Judgment and thought content normal.  Nursing note and vitals reviewed.   Labs  reviewed:  Recent Labs  12/11/15 0516 12/12/15 0449 01/23/16 1203 02/29/16 0742  NA 135 138 140 138  K 3.2* 3.8 4.0 3.7  CL 103 106 100 99*  CO2 27 26 27 24   GLUCOSE 152* 114* 119* 139*  BUN 10 7 16 17   CREATININE 0.59* 0.57* 0.79 0.86  CALCIUM 7.6* 8.3* 9.5 10.1  MG 1.9 2.1  --   --     Recent Labs  12/11/15 0516 02/29/16 0742  AST 15 22  ALT 12* 18  ALKPHOS 53 68  BILITOT 1.1 0.8  PROT 5.8* 7.8  ALBUMIN 3.3* 4.3    Recent Labs  07/12/15 1635 12/10/15 1005 12/10/15 1044 12/11/15 0516 02/29/16 0742  WBC 7.7 11.2*  --  7.3 11.5*  NEUTROABS 4.9 9.4*  --   --   --   HGB 13.9 13.1 13.6 11.2* 14.6  HCT 40.7 38.6* 40.0 32.6* 44.4  MCV 92.3 92.3  --  92.6 92.9  PLT 202.0 188  --  140* 234   Lab Results  Component Value Date   TSH 2.39 09/14/2014   Lab Results  Component Value Date   HGBA1C 5.8 02/06/2016   Lab Results  Component Value Date   CHOL 136 09/14/2014   HDL 36.80 (L) 09/14/2014   LDLCALC 73 09/14/2014   LDLDIRECT 69.5 10/25/2010   TRIG 129.0 09/14/2014   CHOLHDL 4 09/14/2014    Significant Diagnostic Results in last 30 days:  No results found.  Assessment/Plan Essential hypertension Blood pressure is controlled, continue Atenolol 50mg  qd, Chlorthalidone 25mg , update CMP, TSH  Chronic systolic heart failure (HCC) Compensated clinically, continue Atenolol, Chlorthalidone   Diabetes mellitus type II, uncontrolled Continue Metformin 1000mg  bid, last Hgb a1c 5.8 02/06/16  Below knee amputation status ([place]) The right, f/u Ortho.   Phantom limb pain ([place]) The right below knee amputation, continue prn Norco, Tramadol, and Gabapentin 100mg  bid.   Anxiety state Continue Lorazepam 0.5mg  bid prn.   Heat rash Back and buttocks, apply Mycolog II cream bid until healed.     Family/ staff Communication: SNF for rehab, [place] when [place].   Labs/tests ordered:  CBC, CMP, TSH

## 2016-03-06 NOTE — Assessment & Plan Note (Signed)
Back and buttocks, apply Mycolog II cream bid until healed.

## 2016-03-08 ENCOUNTER — Encounter: Payer: Self-pay | Admitting: Nurse Practitioner

## 2016-03-08 ENCOUNTER — Inpatient Hospital Stay (INDEPENDENT_AMBULATORY_CARE_PROVIDER_SITE_OTHER): Payer: Self-pay | Admitting: Orthopedic Surgery

## 2016-03-08 ENCOUNTER — Inpatient Hospital Stay (INDEPENDENT_AMBULATORY_CARE_PROVIDER_SITE_OTHER): Payer: Medicare Other | Admitting: Orthopedic Surgery

## 2016-03-08 ENCOUNTER — Non-Acute Institutional Stay (SKILLED_NURSING_FACILITY): Payer: Medicare Other | Admitting: Nurse Practitioner

## 2016-03-08 DIAGNOSIS — F411 Generalized anxiety disorder: Secondary | ICD-10-CM

## 2016-03-08 DIAGNOSIS — E1100 Type 2 diabetes mellitus with hyperosmolarity without nonketotic hyperglycemic-hyperosmolar coma (NKHHC): Secondary | ICD-10-CM | POA: Diagnosis not present

## 2016-03-08 DIAGNOSIS — Z89511 Acquired absence of right leg below knee: Secondary | ICD-10-CM | POA: Diagnosis not present

## 2016-03-08 DIAGNOSIS — I5022 Chronic systolic (congestive) heart failure: Secondary | ICD-10-CM | POA: Diagnosis not present

## 2016-03-08 DIAGNOSIS — G546 Phantom limb syndrome with pain: Secondary | ICD-10-CM

## 2016-03-08 DIAGNOSIS — I10 Essential (primary) hypertension: Secondary | ICD-10-CM | POA: Diagnosis not present

## 2016-03-08 DIAGNOSIS — E11610 Type 2 diabetes mellitus with diabetic neuropathic arthropathy: Secondary | ICD-10-CM

## 2016-03-08 DIAGNOSIS — L740 Miliaria rubra: Secondary | ICD-10-CM

## 2016-03-08 NOTE — Assessment & Plan Note (Signed)
F/u Ortho, healing nicely, no s/s of peri wound cellulitis, transfer to [facility] for continue Rehab.

## 2016-03-08 NOTE — Assessment & Plan Note (Signed)
The right below knee amputation, continue prn Norco, Tramadol, and Gabapentin 100mg  bid.

## 2016-03-08 NOTE — Assessment & Plan Note (Signed)
Blood pressure is controlled, continue Atenolol 50mg  qd, Chlorthalidone 25mg , pendign CMP, TSH

## 2016-03-08 NOTE — Progress Notes (Signed)
Location:  [name] Room Number: 35 Place of Service:  SNF (31)  Provider: [name]  NP PCP: [name], MD Patient Care Team: [name], MD as PCP - General (Internal Medicine)  Extended Emergency Contact Information Primary Emergency Contact: [name] Address: [name], [place] [address] [name] Phone: [phone] Relation: Daughter  Code Status: Full Code Goals of care:  Advanced Directive information Advanced Directives 03/08/2016  Does patient have an advance directive? Yes  Type of Paramedic of [place];Living will  Does patient want to make changes to advanced directive? No - Patient declined  Copy of advanced directive(s) in chart? Yes  Would patient like information on creating an advanced directive? -     Allergies  Allergen Reactions  . No Known Allergies     Chief Complaint  Patient presents with  . Discharge Note    HPI:  75 y.o. male  S/p the right below knee amputation, f/u Ortho, recommended [facility] for Rehab.    02/29/16 to 03/03/16 the right below knee amputation as resultant of right foot osteomyelitis.    [facility] 03/03/16 to 03/08/16 for Rehab since discharged from hospital.               Hx of controlled T2DM, takes Metformin 1000mg  bid, blood pressure is controlled on Atenolol, Chlorthalidone. Right leg phantom pain is well managed with Gabapentin, prn Norco and Tramadol. He takes Lorazepam prn for his anxiety. He stated he slept well, is getting his appetite back.     Past Medical History:  Diagnosis Date  . CAD (coronary artery disease)    Minimal plaque cath 2008  . Cataract    both  . Complication of anesthesia   . Diabetes mellitus   . ED (erectile dysfunction)   . Hypertension   . Osteoarthritis   . PONV (postoperative nausea and vomiting) 1960   with Ether  . Rheumatic fever   . Rhinitis     Past Surgical  History:  Procedure Laterality Date  . AMPUTATION Right 02/29/2016   Procedure: Right Leg AMPUTATION BELOW KNEE with Wound Vac placement;  Surgeon: [name], MD;  Location: [facility];  Service: Orthopedics;  Laterality: Right;  . CHOLECYSTECTOMY  1983  . colonoscopy  2006, 2010  . INGUINAL HERNIA REPAIR Right 1960      reports that he quit smoking about 34 years ago. He has never used smokeless tobacco. He reports that he drinks about 8.4 oz of alcohol per week . He reports that he does not use drugs. Social History   Social History  . Marital status: Married    Spouse name: N/A  . Number of children: 1  . Years of education: 16+   Occupational History  . 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 Smoking status: Former Smoker    Quit date: 07/26/1981  . Smokeless tobacco: Never Used  . Alcohol use 8.4 oz/week    14 Shots of liquor per week  . Drug use: No  . Sexual activity: No   Other Topics Concern  . Not on file   Social History Narrative   Lives with wife.   epworth sleepiness scale = 5 (01/20/16)   Functional Status Survey:    Allergies  Allergen Reactions  . No Known Allergies     Pertinent  Health Maintenance Due  Topic Date Due  . PNA vac Low Risk Adult (2 of 2 - PCV13) 05/28/2006  . OPHTHALMOLOGY EXAM  07/01/2015  . INFLUENZA VACCINE  12/27/2015  . FOOT EXAM  07/11/2016  . URINE MICROALBUMIN  07/11/2016  . HEMOGLOBIN A1C  08/05/2016    Medications:   Medication List       Accurate as of 03/08/16  4:39 PM. Always use your most recent med list.          accu-chek multiclix lancets USE TO TEST ONCE DAILY   aspirin EC 325 MG tablet Take 325 mg by mouth every evening.   atenolol 50 MG tablet Commonly known as:  TENORMIN Take 50 mg by mouth daily.   halobetasol 0.05 % cream Commonly known as:  ULTRAVATE Apply 1 application topically 3 (three) times daily as needed (for skin irritation).     HYDROcodone-acetaminophen 5-325 MG tablet Commonly known as:  NORCO Take 1 tablet by mouth every 6 (six) hours as needed.   loratadine 10 MG tablet Commonly known as:  CLARITIN Take 10 mg by mouth daily as needed for allergies.   LORazepam 0.5 MG tablet Commonly known as:  ATIVAN TAKE 1 TABLET BY MOUTH TWICE A DAY AS NEEDED   metFORMIN 1000 MG tablet Commonly known as:  GLUCOPHAGE TAKE ONE TABLET BY MOUTH TWICE DAILY   multivitamin tablet Take 1 tablet by mouth daily.   nystatin-triamcinolone cream Commonly known as:  MYCOLOG II Apply 1 application topically 2 (two) times daily. For back and buttocks.   potassium chloride SA 20 MEQ tablet Commonly known as:  K-DUR,KLOR-CON 2 tabs daily in the morning   traMADol 50 MG tablet Commonly known as:  ULTRAM Take 50 mg by mouth 2 (two) times daily as needed (for pain.).   TURMERIC PO Take 1 tablet by mouth daily as needed (FOR LEG CRAMPS).       Review of Systems  Constitutional: Negative for activity change, appetite change, chills, diaphoresis, fatigue and fever.  HENT: Negative for congestion, facial swelling, hearing loss, mouth sores and nosebleeds.   Eyes: Negative for photophobia, pain, discharge, redness and itching.  Respiratory: Negative for apnea, cough, choking, chest tightness, shortness of breath and wheezing.   Cardiovascular: Negative for chest pain, palpitations and leg swelling.  Gastrointestinal: Negative for abdominal distention, abdominal pain, anal bleeding, blood in stool, constipation, diarrhea and nausea.  Endocrine: Negative for cold intolerance, heat intolerance, polydipsia, polyphagia and polyuria.       Hx of T2DM  Genitourinary: Negative for dysuria, flank pain, frequency, hematuria and urgency.  Musculoskeletal: Positive for gait problem and myalgias. Negative for arthralgias, back pain, joint swelling and neck stiffness.       S/p below knee amputation  Skin: Positive for color change, rash  and wound. Negative for pallor.       The right below knee amputation, surgical incision intact with staples in place, mild erythema and swelling at the surgical area, no bleed or drainage seen.  Heat rash lower back and buttocks.   Allergic/Immunologic: Negative for environmental allergies, food allergies and immunocompromised state.  Neurological: Negative for dizziness, tremors, seizures, syncope, facial asymmetry, speech difficulty, light-headedness and numbness.  Hematological: Negative for adenopathy. Does not bruise/bleed easily.  Psychiatric/Behavioral: Negative.     Vitals:   03/08/16 1615  BP: 125/79  Pulse: 74  Resp: 18  Temp: 99.7 F (37.6 C)  SpO2: 97%  Weight: 205 lb 3.2 oz (93.1 kg)  Height: 6' (  1.829 m)   Body mass index is 27.83 kg/m. Physical Exam  Constitutional: He is oriented to person, place, and time. He appears well-developed and well-nourished. No distress.  HENT:  Head: Normocephalic and atraumatic.  Right Ear: External ear normal.  Left Ear: External ear normal.  Nose: Nose normal.  Mouth/Throat: Oropharynx is clear and moist. No oropharyngeal exudate.  Eyes: Conjunctivae and EOM are normal. Pupils are equal, round, and reactive to light.  Neck: Normal range of motion. Neck supple.  Cardiovascular: Normal rate, regular rhythm, normal heart sounds and intact distal pulses.  Exam reveals no gallop and no friction rub.   No murmur heard. Pulmonary/Chest: Effort normal and breath sounds normal. No respiratory distress. He has no wheezes. He has no rales. He exhibits no tenderness.  Abdominal: He exhibits no distension. There is no tenderness. There is no rebound and no guarding.  Musculoskeletal: Normal range of motion. He exhibits edema. He exhibits no tenderness or deformity.  Mild swelling at the surgical site of the right below knee amputation.   Neurological: He is alert and oriented to person, place, and time. He has normal reflexes.  Skin: Skin is  warm and dry. Rash noted. He is not diaphoretic. There is erythema. No pallor.  The right below knee amputation, surgical incision intact with staples in place, mild erythema and swelling at the surgical area, no bleed or drainage seen.  Heat rash lower back and buttocks.    Psychiatric: He has a normal mood and affect. His behavior is normal. Judgment and thought content normal.  Nursing note and vitals reviewed.   Labs reviewed: Basic Metabolic Panel:  Recent Labs  12/11/15 0516 12/12/15 0449 01/23/16 1203 02/29/16 0742  NA 135 138 140 138  K 3.2* 3.8 4.0 3.7  CL 103 106 100 99*  CO2 27 26 27 24   GLUCOSE 152* 114* 119* 139*  BUN 10 7 16 17   CREATININE 0.59* 0.57* 0.79 0.86  CALCIUM 7.6* 8.3* 9.5 10.1  MG 1.9 2.1  --   --    Liver Function Tests:  Recent Labs  12/11/15 0516 02/29/16 0742  AST 15 22  ALT 12* 18  ALKPHOS 53 68  BILITOT 1.1 0.8  PROT 5.8* 7.8  ALBUMIN 3.3* 4.3   No results for input(s): LIPASE, AMYLASE in the last 8760 hours. No results for input(s): AMMONIA in the last 8760 hours. CBC:  Recent Labs  07/12/15 1635 12/10/15 1005 12/10/15 1044 12/11/15 0516 02/29/16 0742  WBC 7.7 11.2*  --  7.3 11.5*  NEUTROABS 4.9 9.4*  --   --   --   HGB 13.9 13.1 13.6 11.2* 14.6  HCT 40.7 38.6* 40.0 32.6* 44.4  MCV 92.3 92.3  --  92.6 92.9  PLT 202.0 188  --  140* 234   Cardiac Enzymes: No results for input(s): CKTOTAL, CKMB, CKMBINDEX, TROPONINI in the last 8760 hours. BNP: Invalid input(s): POCBNP CBG:  Recent Labs  03/02/16 2133 03/03/16 0536 03/03/16 1118  GLUCAP 121* 125* 131*    Procedures and Imaging Studies During Stay: No results found.  Assessment/Plan:   Essential hypertension Blood pressure is controlled, continue Atenolol 50mg  qd, Chlorthalidone 25mg , pendign CMP, TSH  Chronic systolic heart failure (HCC) Compensated clinically, continue Atenolol, Chlorthalidone   Diabetes mellitus type II, uncontrolled (HCC) Continue  Metformin 1000mg  bid, last Hgb a1c 5.8 02/06/16  Phantom limb pain (HCC) The right below knee amputation, continue prn Norco, Tramadol, and Gabapentin 100mg  bid.   Heat rash Persisted  heat rash in lower back and buttocks, apply Mycolog II cream bid until healed. Stated its  less itching.   Below knee amputation status ([place]) F/u Ortho, healing nicely, no s/s of peri wound cellulitis, transfer to [facility] for continue Rehab.   Anxiety state Continue Lorazepam 0.5mg  bid prn.      Patient is being discharged with the following home health services:    Patient is being discharged with the following durable medical equipment:    Patient has been advised to f/u with their PCP in 1-2 weeks to for a transitions of care visit.  Social services at their facility was responsible for arranging this appointment.  Pt was provided with adequate prescriptions of noncontrolled medications to reach the scheduled appointment .  For controlled substances, a limited supply was provided as appropriate for the individual patient.  If the pt normally receives these medications from a pain clinic or has a contract with another physician, these medications should be received from that clinic or physician only).    Future labs/tests needed:  none

## 2016-03-08 NOTE — Assessment & Plan Note (Signed)
Continue Lorazepam 0.5mg  bid prn.

## 2016-03-08 NOTE — Assessment & Plan Note (Signed)
Persisted heat rash in lower back and buttocks, apply Mycolog II cream bid until healed. Stated its  less itching.

## 2016-03-08 NOTE — Assessment & Plan Note (Signed)
Compensated clinically, continue Atenolol, Chlorthalidone

## 2016-03-08 NOTE — Assessment & Plan Note (Signed)
Continue Metformin 1000mg  bid, last Hgb a1c 5.8 02/06/16

## 2016-03-09 DIAGNOSIS — R262 Difficulty in walking, not elsewhere classified: Secondary | ICD-10-CM | POA: Diagnosis not present

## 2016-03-09 DIAGNOSIS — I2510 Atherosclerotic heart disease of native coronary artery without angina pectoris: Secondary | ICD-10-CM | POA: Diagnosis not present

## 2016-03-09 DIAGNOSIS — E1165 Type 2 diabetes mellitus with hyperglycemia: Secondary | ICD-10-CM | POA: Diagnosis not present

## 2016-03-09 DIAGNOSIS — I10 Essential (primary) hypertension: Secondary | ICD-10-CM | POA: Diagnosis not present

## 2016-03-09 DIAGNOSIS — M86671 Other chronic osteomyelitis, right ankle and foot: Secondary | ICD-10-CM | POA: Diagnosis not present

## 2016-03-09 DIAGNOSIS — Z89519 Acquired absence of unspecified leg below knee: Secondary | ICD-10-CM | POA: Diagnosis not present

## 2016-03-09 DIAGNOSIS — I5022 Chronic systolic (congestive) heart failure: Secondary | ICD-10-CM | POA: Diagnosis not present

## 2016-03-09 DIAGNOSIS — F419 Anxiety disorder, unspecified: Secondary | ICD-10-CM | POA: Diagnosis not present

## 2016-03-09 DIAGNOSIS — I471 Supraventricular tachycardia: Secondary | ICD-10-CM | POA: Diagnosis not present

## 2016-03-09 DIAGNOSIS — Z4789 Encounter for other orthopedic aftercare: Secondary | ICD-10-CM | POA: Diagnosis not present

## 2016-03-09 DIAGNOSIS — L97509 Non-pressure chronic ulcer of other part of unspecified foot with unspecified severity: Secondary | ICD-10-CM | POA: Diagnosis not present

## 2016-03-09 DIAGNOSIS — M6281 Muscle weakness (generalized): Secondary | ICD-10-CM | POA: Diagnosis not present

## 2016-03-09 DIAGNOSIS — E118 Type 2 diabetes mellitus with unspecified complications: Secondary | ICD-10-CM | POA: Diagnosis not present

## 2016-03-09 DIAGNOSIS — M1990 Unspecified osteoarthritis, unspecified site: Secondary | ICD-10-CM | POA: Diagnosis not present

## 2016-03-12 DIAGNOSIS — M86671 Other chronic osteomyelitis, right ankle and foot: Secondary | ICD-10-CM | POA: Diagnosis not present

## 2016-03-12 DIAGNOSIS — F419 Anxiety disorder, unspecified: Secondary | ICD-10-CM | POA: Diagnosis not present

## 2016-03-12 DIAGNOSIS — E118 Type 2 diabetes mellitus with unspecified complications: Secondary | ICD-10-CM | POA: Diagnosis not present

## 2016-03-12 DIAGNOSIS — I10 Essential (primary) hypertension: Secondary | ICD-10-CM | POA: Diagnosis not present

## 2016-03-22 ENCOUNTER — Ambulatory Visit (INDEPENDENT_AMBULATORY_CARE_PROVIDER_SITE_OTHER): Payer: Medicare Other | Admitting: Family

## 2016-03-22 VITALS — Ht 72.0 in | Wt 215.0 lb

## 2016-03-22 DIAGNOSIS — Z89511 Acquired absence of right leg below knee: Secondary | ICD-10-CM

## 2016-03-22 NOTE — Progress Notes (Signed)
Office Visit Note   Patient: [name]           Date of Birth: July 20, 1940           MRN: [id number] Visit Date: 03/22/2016              Requested by: [name], MD [address] [place],  [address] PCP: [name], MD   Assessment & Plan: Visit Diagnoses:  1. Hx of BKA, right ([place])     Plan: Continue daily wound cleansing. Wear Vive shrinker directly overlying the skin around the clock. Staples were harvested today. He'll follow with biotech for prosthesis. We'll make a great K2 level ambulator.  Follow-Up Instructions: Return in about 2 weeks (around 04/05/2016), or post op check bka.   Orders:  No orders of the defined types were placed in this encounter.  No orders of the defined types were placed in this encounter.     Procedures: No procedures performed   Clinical Data: No additional findings.   Subjective: Chief Complaint  Patient presents with  . Right Knee - Routine Post Op  . Routine Post Op    right below the knee amputation with prevena application on [id number]    Patient is at [facility]. He is doing well with physical therapy. He is wearing a Lexicographer today but states that he only has one and has to alternate between the regular shrinker from [place] " the one with the ring" and Vive. Pt  states that he wears this daily. Staples are intact. No swelling, redness or draining from the incsion. He states " I like wearing that black shrinker ( vive) it feels good and I want another one".  States that when he does have discomfort he will use Tramadol 50 mg prn and this works well for him. He does c/o some phantom pain " ankle hurts, toes itch" states that " I can take it"    Review of Systems  Constitutional: Negative for chills and fever.     Objective: Vital Signs: Ht 6' (1.829 m)   Wt 215 lb (97.5 kg)   BMI 29.16 kg/m   Physical Exam  Ortho Exam  Him incision well approximated with staples in place. Him is well-healed.  Scattered eschar. No drainage. No erythema or sign of infection.  Specialty Comments:  No specialty comments available.  Imaging: No results found.   PMFS History: Patient Active Problem List   Diagnosis Date Noted  . Phantom limb pain ([place]) 03/06/2016  . Heat rash 03/06/2016  . Below knee amputation status ([place]) 02/29/2016  . SVT (supraventricular tachycardia) ([place]) 01/20/2016  . Chronic systolic heart failure ([place]) 01/20/2016  . Sepsis ([place]) 12/10/2015  . Diabetic foot ulcers ([place]) 07/12/2015  . Dysphagia 08/06/2014  . Anxiety state 10/26/2013  . PVC (premature ventricular contraction) 07/27/2011  . Diabetes mellitus type II, uncontrolled ([place]) 11/02/2010  . ASTHMA NOS W/ACUTE EXACERBATION 06/06/2010  . BASAL CELL CARCINOMA, FACE 10/18/2008  . BASAL CELL CARCINOMA, FACE 10/18/2008  . LIPOMA, SKIN 05/31/2008  . DRY SKIN 05/31/2008  . ERECTILE DYSFUNCTION 09/30/2007  . OSTEOARTHRITIS 08/08/2007  . Essential hypertension 03/24/2007   Past Medical History:  Diagnosis Date  . CAD (coronary artery disease)    Minimal plaque cath 2008  . Cataract    both  . Complication of anesthesia   . Diabetes mellitus   . ED (erectile dysfunction)   . Hypertension   . Osteoarthritis   . PONV (  postoperative nausea and vomiting) 1960   with Ether  . Rheumatic fever   . Rhinitis     Family History  Problem Relation Age of Onset  . Stroke Father 62    Died age 84  . Coronary artery disease Brother 69  . Colon cancer Neg Hx   . Colon polyps Neg Hx   . Diabetes Neg Hx   . Esophageal cancer Neg Hx   . Kidney disease Neg Hx   . Gallbladder disease Neg Hx     Past Surgical History:  Procedure Laterality Date  . AMPUTATION Right 02/29/2016   Procedure: Right Leg AMPUTATION BELOW KNEE with Wound Vac placement;  Surgeon: [name], MD;  Location: [facility];  Service: Orthopedics;  Laterality: Right;  . CHOLECYSTECTOMY  1983  . colonoscopy  2006, 2010  . INGUINAL HERNIA REPAIR Right  1960   Social History   Occupational History  . 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 Smoking status: Former Smoker    Quit date: 07/26/1981  . Smokeless tobacco: Never Used  . Alcohol use 8.4 oz/week    14 Shots of liquor per week  . Drug use: No  . Sexual activity: No

## 2016-03-28 NOTE — Telephone Encounter (Signed)
Closed encounter °

## 2016-03-30 ENCOUNTER — Ambulatory Visit (INDEPENDENT_AMBULATORY_CARE_PROVIDER_SITE_OTHER): Payer: Medicare Other | Admitting: Orthopedic Surgery

## 2016-04-05 ENCOUNTER — Encounter (INDEPENDENT_AMBULATORY_CARE_PROVIDER_SITE_OTHER): Payer: Self-pay | Admitting: Orthopedic Surgery

## 2016-04-05 ENCOUNTER — Ambulatory Visit (INDEPENDENT_AMBULATORY_CARE_PROVIDER_SITE_OTHER): Payer: Medicare Other | Admitting: Orthopedic Surgery

## 2016-04-05 VITALS — Ht 72.0 in | Wt 215.0 lb

## 2016-04-05 DIAGNOSIS — I10 Essential (primary) hypertension: Secondary | ICD-10-CM | POA: Diagnosis not present

## 2016-04-05 DIAGNOSIS — E118 Type 2 diabetes mellitus with unspecified complications: Secondary | ICD-10-CM | POA: Diagnosis not present

## 2016-04-05 DIAGNOSIS — IMO0002 Reserved for concepts with insufficient information to code with codable children: Secondary | ICD-10-CM

## 2016-04-05 DIAGNOSIS — M86671 Other chronic osteomyelitis, right ankle and foot: Secondary | ICD-10-CM | POA: Diagnosis not present

## 2016-04-05 DIAGNOSIS — I2510 Atherosclerotic heart disease of native coronary artery without angina pectoris: Secondary | ICD-10-CM | POA: Diagnosis not present

## 2016-04-05 DIAGNOSIS — Z89511 Acquired absence of right leg below knee: Secondary | ICD-10-CM

## 2016-04-05 NOTE — Progress Notes (Signed)
Wound Care Note   Patient: [name]           Date of Birth: 1941-03-16           MRN: [id number]             PCP: [name], MD Visit Date: 04/05/2016   Assessment & Plan: Visit Diagnoses:  1. Below knee amputation status, right ([place])     Plan: Follow-up in 4 weeks. Patient was given instructions for removal is to continue physical therapy wash his leg with soap and water where the medical compression stocking around-the-clock he is currently in a 2 XL follow-up in 4 weeks. Patient may be ready for prosthetic fitting in 3-4 weeks.   Follow-Up Instructions: Return in about 4 weeks (around 05/03/2016).  Orders:  No orders of the defined types were placed in this encounter.  No orders of the defined types were placed in this encounter.     Procedures: No notes on file   Clinical Data: No additional findings.   No images are attached to the encounter.   Subjective: Chief Complaint  Patient presents with  . Right Leg - Routine Post Op    02/29/16 right transtibial amputation with prevena vac    Patient is 5 weeks s/p a right transtibial amputation. He is healing well with a few scabbed over areas along the incision. He is wearing a Lexicographer. And feels well with no questions or concerns.    Review of Systems    Miscellaneous:e: N/A  -Home Health Car  -Physical Therapy: Daily  -Out of Work?: N/A  -Worker's Compensation Case?: N/A  -Additional Information: N/A   Objective: Vital Signs: Ht 6' (1.829 m)   Wt 215 lb (97.5 kg)   BMI 29.16 kg/m   Physical Exam: Examination patient has residual limb is consolidating nicely. There is no swelling there is a very thin scab along the wound edges. This should resolve uneventfully.  Specialty Comments: No specialty comments available.   PMFS History: Patient Active Problem List   Diagnosis Date Noted  . Phantom limb pain ([place]) 03/06/2016  . Heat rash 03/06/2016  . Below knee amputation status ([place])  02/29/2016  . SVT (supraventricular tachycardia) ([place]) 01/20/2016  . Chronic systolic heart failure ([place]) 01/20/2016  . Sepsis ([place]) 12/10/2015  . Diabetic foot ulcers ([place]) 07/12/2015  . Dysphagia 08/06/2014  . Anxiety state 10/26/2013  . PVC (premature ventricular contraction) 07/27/2011  . Diabetes mellitus type II, uncontrolled ([place]) 11/02/2010  . ASTHMA NOS W/ACUTE EXACERBATION 06/06/2010  . BASAL CELL CARCINOMA, FACE 10/18/2008  . BASAL CELL CARCINOMA, FACE 10/18/2008  . LIPOMA, SKIN 05/31/2008  . DRY SKIN 05/31/2008  . ERECTILE DYSFUNCTION 09/30/2007  . OSTEOARTHRITIS 08/08/2007  . Essential hypertension 03/24/2007   Past Medical History:  Diagnosis Date  . CAD (coronary artery disease)    Minimal plaque cath 2008  . Cataract    both  . Complication of anesthesia   . Diabetes mellitus   . ED (erectile dysfunction)   . Hypertension   . Osteoarthritis   . PONV (postoperative nausea and vomiting) 1960   with Ether  . Rheumatic fever   . Rhinitis     Family History  Problem Relation Age of Onset  . Stroke Father 25    Died age 65  . Coronary artery disease Brother 58  . Colon cancer Neg Hx   . Colon polyps Neg Hx   . Diabetes Neg Hx   . Esophageal  cancer Neg Hx   . Kidney disease Neg Hx   . Gallbladder disease Neg Hx    Past Surgical History:  Procedure Laterality Date  . AMPUTATION Right 02/29/2016   Procedure: Right Leg AMPUTATION BELOW KNEE with Wound Vac placement;  Surgeon: [name], MD;  Location: [facility];  Service: Orthopedics;  Laterality: Right;  . CHOLECYSTECTOMY  1983  . colonoscopy  2006, 2010  . INGUINAL HERNIA REPAIR Right 1960   Social History   Occupational History  . 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 Smoking status: Former Smoker    Quit date: 07/26/1981  . Smokeless tobacco: Never Used  . Alcohol use 8.4 oz/week    14 Shots of liquor per week  . Drug use: No  . Sexual activity: No

## 2016-04-17 ENCOUNTER — Ambulatory Visit: Payer: Medicare Other | Admitting: Internal Medicine

## 2016-05-03 ENCOUNTER — Ambulatory Visit (INDEPENDENT_AMBULATORY_CARE_PROVIDER_SITE_OTHER): Payer: Medicare Other | Admitting: Orthopedic Surgery

## 2016-05-03 DIAGNOSIS — Z89511 Acquired absence of right leg below knee: Secondary | ICD-10-CM | POA: Insufficient documentation

## 2016-05-03 NOTE — Progress Notes (Signed)
Office Visit Note   Patient: [name]           Date of Birth: [birth year]           MRN: [id number] Visit Date: 05/03/2016              Requested by: [name], MD [address] [place], [place] [address] PCP: [name], MD   Assessment & Plan: Visit Diagnoses:  1. H/O amputation of leg through tibia and fibula, right (HCC)     Plan: Patient's legs shows excellent consolidation of the transtibial amputation. He has 3 small scabs less than 5 mm in diameter we will have him go for a biotech to see if they can fit him for a prosthesis. Patient is wearing the medical compression stump shrinker.  Follow-Up Instructions: Return in about 4 weeks (around 05/31/2016).   Orders:  No orders of the defined types were placed in this encounter.  No orders of the defined types were placed in this encounter.     Procedures: No procedures performed   Clinical Data: No additional findings.   Subjective: Chief Complaint  Patient presents with  . Right Leg - Follow-up    Right BKA 02/29/16    Patient is 10 weeks s/p a right BKA. 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 Getting physical therapy and awaiting casting for prosthetic. He states that Bi[employer] is waiting for a few scabs to "fall off"    Review of Systems   Objective: Vital Signs: There were no vitals taken for this visit.  Physical Exam examination patient has a well consolidated residual limb right transtibial amputation there is no redness no cellulitis no drainage no signs of infection. Patient has a good distal soft tissue envelope.  Ortho Exam  Specialty Comments:  No specialty comments available.  Imaging: No results found.   PMFS History: Patient Active Problem List   Diagnosis Date Noted  . H/O amputation of leg through tibia and fibula, right ([place]) 05/03/2016  . Phantom limb pain ([place]) 03/06/2016  . Heat rash 03/06/2016  . Below knee amputation status ([place]) 02/29/2016  . SVT  (supraventricular tachycardia) ([place]) 01/20/2016  . Chronic systolic heart failure ([place]) 01/20/2016  . Sepsis ([place]) 12/10/2015  . Diabetic foot ulcers ([place]) 07/12/2015  . Dysphagia 08/06/2014  . Anxiety state 10/26/2013  . PVC (premature ventricular contraction) 07/27/2011  . Diabetes mellitus type II, uncontrolled ([place]) 11/02/2010  . ASTHMA NOS W/ACUTE EXACERBATION 06/06/2010  . BASAL CELL CARCINOMA, FACE 10/18/2008  . BASAL CELL CARCINOMA, FACE 10/18/2008  . LIPOMA, SKIN 05/31/2008  . DRY SKIN 05/31/2008  . ERECTILE DYSFUNCTION 09/30/2007  . OSTEOARTHRITIS 08/08/2007  . Essential hypertension 03/24/2007   Past Medical History:  Diagnosis Date  . CAD (coronary artery disease)    Minimal plaque cath 2008  . Cataract    both  . Complication of anesthesia   . Diabetes mellitus   . ED (erectile dysfunction)   . Hypertension   . Osteoarthritis   . PONV (postoperative nausea and vomiting) 1960   with Ether  . Rheumatic fever   . Rhinitis     Family History  Problem Relation Age of Onset  . Stroke Father 85    Died age 71  . Coronary artery disease Brother 75  . Colon cancer Neg Hx   . Colon polyps Neg Hx   . Diabetes Neg Hx   . Esophageal cancer Neg Hx   . Kidney  disease Neg Hx   . Gallbladder disease Neg Hx     Past Surgical History:  Procedure Laterality Date  . AMPUTATION Right 02/29/2016   Procedure: Right Leg AMPUTATION BELOW KNEE with Wound Vac placement;  Surgeon: [name], MD;  Location: [facility];  Service: Orthopedics;  Laterality: Right;  . CHOLECYSTECTOMY  1983  . colonoscopy  2006, 2010  . INGUINAL HERNIA REPAIR Right 1960   Social History   Occupational History  . 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 Smoking status: Former Smoker    Quit date: 07/26/1981  . Smokeless tobacco: Never Used  . Alcohol use 8.4 oz/week    14 Shots of liquor per week  . Drug use: No  . Sexual activity: No

## 2016-05-13 ENCOUNTER — Other Ambulatory Visit: Payer: Self-pay | Admitting: Family Medicine

## 2016-05-31 ENCOUNTER — Encounter (INDEPENDENT_AMBULATORY_CARE_PROVIDER_SITE_OTHER): Payer: Self-pay | Admitting: Orthopedic Surgery

## 2016-05-31 ENCOUNTER — Ambulatory Visit (INDEPENDENT_AMBULATORY_CARE_PROVIDER_SITE_OTHER): Payer: Medicare Other | Admitting: Orthopedic Surgery

## 2016-05-31 DIAGNOSIS — E1142 Type 2 diabetes mellitus with diabetic polyneuropathy: Secondary | ICD-10-CM | POA: Insufficient documentation

## 2016-05-31 DIAGNOSIS — Z89511 Acquired absence of right leg below knee: Secondary | ICD-10-CM

## 2016-05-31 NOTE — Progress Notes (Signed)
Office Visit Note   Patient: [name]           Date of Birth: Sep 15, 1940           MRN: [id number] Visit Date: 05/31/2016              Requested by: [name], MD [address] [place], [place] [address] PCP: [name], MD  Chief Complaint  Patient presents with  . Right Knee - Follow-up    Right BKA with Wound Vac placement  02/29/16    HPI: Patient presents today for 4 week follow up right transtibial amputation 02/29/16. Patient is frustrated, he thought he would have already had prosthetic fabricated. He has two eschar anterior residual limb. He has had to have casting postponed due to these areas, to prevent further skin breakdown. He was wearing medical vive shrinker. He feels like progress has slowed down. There is minimal redness, there is bloody drainage on occasion when shrinker is being removed.    Patient denies any fever or chills. Assessment & Plan: Visit Diagnoses:  1. Status post below knee amputation of right lower extremity ([place])   2. Diabetic polyneuropathy associated with type 2 diabetes mellitus ([place])     Plan: Patient is being evaluated with [employer] next week for evaluation for prosthetic fitting. He has good consolidation the leg and has 2 scabs that are about 5 mm in diameter  Follow-Up Instructions: Return in about 4 weeks (around 06/28/2016).   Ortho Exam On examination patient has 2 scabs which appears stable there is no cellulitis no drainage no odor no signs of infection. His leg is consolidated well. He is wearing the medical compression stump shrinker and a compression sock on the left.  Imaging: No results found.  Orders:  No orders of the defined types were placed in this encounter.  No orders of the defined types were placed in this encounter.    Procedures: No procedures performed  Clinical Data: No additional findings.  Subjective: Review of Systems  Objective: Vital Signs: There were no vitals taken for this  visit.  Specialty Comments:  No specialty comments available.  PMFS History: Patient Active Problem List   Diagnosis Date Noted  . Diabetic polyneuropathy associated with type 2 diabetes mellitus ([place]) 05/31/2016  . H/O amputation of leg through tibia and fibula, right ([place]) 05/03/2016  . Phantom limb pain ([place]) 03/06/2016  . Heat rash 03/06/2016  . Below knee amputation status ([place]) 02/29/2016  . SVT (supraventricular tachycardia) ([place]) 01/20/2016  . Chronic systolic heart failure ([place]) 01/20/2016  . Sepsis ([place]) 12/10/2015  . Diabetic foot ulcers ([place]) 07/12/2015  . Dysphagia 08/06/2014  . Anxiety state 10/26/2013  . PVC (premature ventricular contraction) 07/27/2011  . Diabetes mellitus type II, uncontrolled ([place]) 11/02/2010  . ASTHMA NOS W/ACUTE EXACERBATION 06/06/2010  . BASAL CELL CARCINOMA, FACE 10/18/2008  . BASAL CELL CARCINOMA, FACE 10/18/2008  . LIPOMA, SKIN 05/31/2008  . DRY SKIN 05/31/2008  . ERECTILE DYSFUNCTION 09/30/2007  . OSTEOARTHRITIS 08/08/2007  . Essential hypertension 03/24/2007   Past Medical History:  Diagnosis Date  . CAD (coronary artery disease)    Minimal plaque cath 2008  . Cataract    both  . Complication of anesthesia   . Diabetes mellitus   . ED (erectile dysfunction)   . Hypertension   . Osteoarthritis   . PONV (postoperative nausea and vomiting) 1960   with Ether  . Rheumatic fever   . Rhinitis  Family History  Problem Relation Age of Onset  . Stroke Father 10    Died age 51  . Coronary artery disease Brother 72  . Colon cancer Neg Hx   . Colon polyps Neg Hx   . Diabetes Neg Hx   . Esophageal cancer Neg Hx   . Kidney disease Neg Hx   . Gallbladder disease Neg Hx     Past Surgical History:  Procedure Laterality Date  . AMPUTATION Right 02/29/2016   Procedure: Right Leg AMPUTATION BELOW KNEE with Wound Vac placement;  Surgeon: [name], MD;  Location: [facility];  Service: Orthopedics;  Laterality: Right;  .  CHOLECYSTECTOMY  1983  . colonoscopy  2006, 2010  . INGUINAL HERNIA REPAIR Right 1960   Social History   Occupational History  . 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 Smoking status: Former Smoker    Quit date: 07/26/1981  . Smokeless tobacco: Never Used  . Alcohol use 8.4 oz/week    14 Shots of liquor per week  . Drug use: No  . Sexual activity: No

## 2016-06-18 ENCOUNTER — Encounter: Payer: Self-pay | Admitting: *Deleted

## 2016-06-18 ENCOUNTER — Ambulatory Visit: Payer: Medicare Other | Admitting: Internal Medicine

## 2016-06-19 ENCOUNTER — Telehealth (INDEPENDENT_AMBULATORY_CARE_PROVIDER_SITE_OTHER): Payer: Self-pay | Admitting: Orthopedic Surgery

## 2016-06-19 NOTE — Telephone Encounter (Signed)
Pt daughter Hinton Dyer requesting a Reclining wheelchair RX sent to [facility] [address] for pt.  Hinton Dyer [phone]

## 2016-06-21 NOTE — Telephone Encounter (Signed)
I called left voicemail for patient's daughter Hinton Dyer and faxed order to [facility] MF:[phone]

## 2016-06-28 ENCOUNTER — Ambulatory Visit (INDEPENDENT_AMBULATORY_CARE_PROVIDER_SITE_OTHER): Payer: Medicare Other | Admitting: Orthopedic Surgery

## 2016-07-04 ENCOUNTER — Other Ambulatory Visit: Payer: Self-pay | Admitting: Family Medicine

## 2016-07-09 ENCOUNTER — Other Ambulatory Visit: Payer: Self-pay | Admitting: Emergency Medicine

## 2016-07-26 ENCOUNTER — Ambulatory Visit (INDEPENDENT_AMBULATORY_CARE_PROVIDER_SITE_OTHER): Payer: Medicare Other | Admitting: Orthopedic Surgery

## 2016-07-30 ENCOUNTER — Ambulatory Visit (INDEPENDENT_AMBULATORY_CARE_PROVIDER_SITE_OTHER): Payer: Medicare Other | Admitting: Orthopedic Surgery

## 2016-07-30 ENCOUNTER — Encounter (INDEPENDENT_AMBULATORY_CARE_PROVIDER_SITE_OTHER): Payer: Self-pay | Admitting: Orthopedic Surgery

## 2016-07-30 ENCOUNTER — Encounter (INDEPENDENT_AMBULATORY_CARE_PROVIDER_SITE_OTHER): Payer: Self-pay

## 2016-07-30 VITALS — Ht 72.0 in | Wt 215.0 lb

## 2016-07-30 DIAGNOSIS — Z89511 Acquired absence of right leg below knee: Secondary | ICD-10-CM

## 2016-07-30 NOTE — Progress Notes (Signed)
Office Visit Note   Patient: [name]           Date of Birth: Aug 03, 1940           MRN: [id number] Visit Date: 07/30/2016              Requested by: [name], MD [address] [place], [place] [address] PCP: [name], MD  Chief Complaint  Patient presents with  . Right Leg - Follow-up    Right BKA 02/29/16    HPI: Pt is follow up a right BKA and is wearing a Vive compression shrinker. No question or concerns. Pamella Pert, RMA    Assessment & Plan: Visit Diagnoses:  1. Status post below knee amputation of right lower extremity ([place])     Plan: I'll follow-up in 2 months. Patient has an appointment Thursday with [employer] for casting for his prosthesis. Recommended hand controls or driving a car. Patient will follow up with [facility] for gait training after he has his prosthetic limb.  Follow-Up Instructions: Return in about 2 months (around 09/29/2016).   Ortho Exam Examination patient is alert oriented no adenopathy well-dressed normal affect normal respiratory effort he's ambulating in a wheelchair. Examination the right lower extremity he has an abrasion over the fibular head region secondary to rubbing his leg with a wheelchair. The residual limb is completely healed there is no redness no cellulitis no swelling no open ulcers no signs of infection. ROS: Complete review of systems negative except as noted in the history of present illness. Imaging: No results found.  Labs: Lab Results  Component Value Date   HGBA1C 5.8 02/06/2016   HGBA1C 5.6 12/10/2015   HGBA1C 5.6 10/05/2015   ESRSEDRATE 25 (H) 12/10/2015   ESRSEDRATE 31 (H) 11/10/2013   CRP 0.8 12/10/2015   CRP 0.6 (H) 11/10/2013   REPTSTATUS 12/15/2015 FINAL 12/10/2015   CULT  12/10/2015    NO GROWTH 5 DAYS Performed at [facility] (A) 12/10/2015    Orders:  No orders of the defined types were placed in this encounter.  No orders of the defined types  were placed in this encounter.    Procedures: No procedures performed  Clinical Data: No additional findings.  Subjective: Review of Systems  Objective: Vital Signs: Ht 6' (1.829 m)   Wt 215 lb (97.5 kg)   BMI 29.16 kg/m   Specialty Comments:  No specialty comments available.  PMFS History: Patient Active Problem List   Diagnosis Date Noted  . Diabetic polyneuropathy associated with type 2 diabetes mellitus ([place]) 05/31/2016  . H/O amputation of leg through tibia and fibula, right ([place]) 05/03/2016  . Phantom limb pain ([place]) 03/06/2016  . Heat rash 03/06/2016  . Below knee amputation status ([place]) 02/29/2016  . SVT (supraventricular tachycardia) ([place]) 01/20/2016  . Chronic systolic heart failure ([place]) 01/20/2016  . Sepsis ([place]) 12/10/2015  . Diabetic foot ulcers ([place]) 07/12/2015  . Dysphagia 08/06/2014  . Anxiety state 10/26/2013  . PVC (premature ventricular contraction) 07/27/2011  . Diabetes mellitus type II, uncontrolled ([place]) 11/02/2010  . ASTHMA NOS W/ACUTE EXACERBATION 06/06/2010  . BASAL CELL CARCINOMA, FACE 10/18/2008  . BASAL CELL CARCINOMA, FACE 10/18/2008  . LIPOMA, SKIN 05/31/2008  . DRY SKIN 05/31/2008  . ERECTILE DYSFUNCTION 09/30/2007  . OSTEOARTHRITIS 08/08/2007  . Essential hypertension 03/24/2007   Past Medical History:  Diagnosis Date  . CAD (coronary artery disease)    Minimal plaque cath 2008  .  Cataract    both  . Complication of anesthesia   . Diabetes mellitus   . ED (erectile dysfunction)   . Hypertension   . Osteoarthritis   . PONV (postoperative nausea and vomiting) 1960   with Ether  . Rheumatic fever   . Rhinitis     Family History  Problem Relation Age of Onset  . Stroke Father 106    Died age 41  . Coronary artery disease Brother 29  . Colon cancer Neg Hx   . Colon polyps Neg Hx   . Diabetes Neg Hx   . Esophageal cancer Neg Hx   . Kidney disease Neg Hx   . Gallbladder disease Neg Hx     Past Surgical History:    Procedure Laterality Date  . AMPUTATION Right 02/29/2016   Procedure: Right Leg AMPUTATION BELOW KNEE with Wound Vac placement;  Surgeon: [name], MD;  Location: [facility];  Service: Orthopedics;  Laterality: Right;  . CHOLECYSTECTOMY  1983  . colonoscopy  2006, 2010  . INGUINAL HERNIA REPAIR Right 1960   Social History   Occupational History  . 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 Smoking status: Former Smoker    Quit date: 07/26/1981  . Smokeless tobacco: Never Used  . Alcohol use 8.4 oz/week    14 Shots of liquor per week  . Drug use: No  . Sexual activity: No

## 2016-08-04 ENCOUNTER — Other Ambulatory Visit: Payer: Self-pay | Admitting: Family Medicine

## 2016-08-06 NOTE — Telephone Encounter (Signed)
Denied.  Message sent to pharmacy.  [name] is primary.

## 2016-08-07 ENCOUNTER — Telehealth: Payer: Self-pay | Admitting: Family Medicine

## 2016-08-07 ENCOUNTER — Telehealth: Payer: Self-pay | Admitting: Internal Medicine

## 2016-08-07 NOTE — Telephone Encounter (Signed)
It is OK to fill the prescription for the strips once with no refill. Please call him and ask him to schedule follow up appt or let us know who he is seeing as his PCP.

## 2016-08-07 NOTE — Telephone Encounter (Signed)
Refill for 100 strips with multiple refills.

## 2016-08-07 NOTE — Telephone Encounter (Signed)
Pt need new Accu Chek test strips  Pharm:  [facility] on [address]

## 2016-08-07 NOTE — Telephone Encounter (Signed)
This patient was Discharge from [facility] in October. No upcoming appointment and hasn't been seen since. Is this ok to still fill? Please Advise.

## 2016-08-07 NOTE — Telephone Encounter (Signed)
It looks like Dr. Nyoka Cowden is patient's PCP - Per Tommi Rumps, I will forward to him. Thanks.

## 2016-08-08 NOTE — Telephone Encounter (Signed)
Pt has been in rehab for the past 3 1/2 months, but states he has been seeing Dr Sherren Mocha for yrs. Has not checked his blood sugar in over a week, because he has nt been able to get this refilled. When he was in rehab, they changed his doctor in the system.  Verified pt has been seen.  Last seen 01/2016 by Tommi Rumps. Does pt need to be seen soon? OK to see Dr Sherren Mocha ?  Last saw Dr Sherren Mocha 06/2015 for DM fu.  accu check test strips

## 2016-08-08 NOTE — Telephone Encounter (Signed)
I called patient and he stated that he sees Dr. Stevie Kern and his office is suppose to be taking care of this. Told us to disregard.

## 2016-08-08 NOTE — Telephone Encounter (Signed)
Pt should have 6 month follow up of diabetes.  A1C at the visit.  Please schedule. Thanks!!

## 2016-08-13 ENCOUNTER — Other Ambulatory Visit: Payer: Self-pay | Admitting: Family Medicine

## 2016-08-13 NOTE — Telephone Encounter (Signed)
Sent in for 3 months.  Spoke to the pt.  He recently had an amputation and cannot get out of the house.  Advised he is due for follow up of diabetes.  Pt will call to schedule an appt as soon as he can.  Reviewed chart.  Pt had CMP on 02/29/16 by Dr. Suzette Battiest and potassium was normal.  Pt to continue same dose of medication.

## 2016-08-16 NOTE — Telephone Encounter (Signed)
Pt has been scheduled.  °

## 2016-09-03 ENCOUNTER — Telehealth: Payer: Self-pay | Admitting: Family Medicine

## 2016-09-03 NOTE — Telephone Encounter (Addendum)
Pt only saw cory for acute visit and pt states dr todd is his md. Misty per telephone note your wrote pt needs dm follow

## 2016-09-03 NOTE — Telephone Encounter (Signed)
Pt is on the schedule for Dr. Sherren Mocha next Monday.  Dr. Sherren Mocha states the pt should be seeing [facility] from now on.  Please move the appt to [facility] and then block that slot.  Thanks!!

## 2016-09-05 ENCOUNTER — Other Ambulatory Visit (INDEPENDENT_AMBULATORY_CARE_PROVIDER_SITE_OTHER): Payer: Self-pay

## 2016-09-05 ENCOUNTER — Telehealth (INDEPENDENT_AMBULATORY_CARE_PROVIDER_SITE_OTHER): Payer: Self-pay | Admitting: *Deleted

## 2016-09-05 DIAGNOSIS — Z89511 Acquired absence of right leg below knee: Secondary | ICD-10-CM

## 2016-09-05 NOTE — Telephone Encounter (Signed)
Juliann Pulse from [facility] called this morning in regards to needing a prescription for physical therapy for this patient. Her Fax # [phone]. Thank you

## 2016-09-05 NOTE — Telephone Encounter (Signed)
Order written and faxed to [facility] and also in chart for [facility]

## 2016-09-06 ENCOUNTER — Telehealth (INDEPENDENT_AMBULATORY_CARE_PROVIDER_SITE_OTHER): Payer: Self-pay | Admitting: *Deleted

## 2016-09-10 ENCOUNTER — Encounter: Payer: Self-pay | Admitting: Family Medicine

## 2016-09-10 ENCOUNTER — Ambulatory Visit (INDEPENDENT_AMBULATORY_CARE_PROVIDER_SITE_OTHER): Payer: Medicare Other | Admitting: Family Medicine

## 2016-09-10 VITALS — BP 120/70 | Temp 97.8°F | Wt 188.0 lb

## 2016-09-10 DIAGNOSIS — Z23 Encounter for immunization: Secondary | ICD-10-CM | POA: Diagnosis not present

## 2016-09-10 DIAGNOSIS — E1100 Type 2 diabetes mellitus with hyperosmolarity without nonketotic hyperglycemic-hyperosmolar coma (NKHHC): Secondary | ICD-10-CM

## 2016-09-10 DIAGNOSIS — I10 Essential (primary) hypertension: Secondary | ICD-10-CM | POA: Diagnosis not present

## 2016-09-10 MED ORDER — METFORMIN HCL 1000 MG PO TABS: 1000.0000 mg | ORAL_TABLET | Freq: Two times a day (BID) | ORAL | 3 refills | Status: DC

## 2016-09-10 MED ORDER — POTASSIUM CHLORIDE CRYS ER 20 MEQ PO TBCR: EXTENDED_RELEASE_TABLET | ORAL | 3 refills | Status: DC

## 2016-09-10 NOTE — Progress Notes (Signed)
Pre visit review using our clinic review tool, if applicable. No additional management support is needed unless otherwise documented below in the visit note. 

## 2016-09-10 NOTE — Addendum Note (Signed)
Addended by: Miles Costain T on: 09/10/2016 04:37 PM   Modules accepted: Orders

## 2016-09-10 NOTE — Progress Notes (Signed)
[name] is a 76 year old married male.......... Nonsmoker......... his wife is home bound with multiple medical problems..... Who comes in today for follow-up of an amputation  He had his right leg amputated below the the knee by Dr. Sharol Given because of complications from diabetes. He's done well it's been slow to heal but he's been recently fitted for his prosthesis. He's can be undergoing physical therapy to learn how to walk again  Tetanus booster January 2008. It's due for tetanus booster plus with the amputation it's mandatory the get a tetanus booster.  Information given on shingles  Last A1c in September 5 0.8%. He's changed his diet he's done 180 pounds. His blood pressure is 120/70 on Tenoretic 50-25 one tab daily  He only occasionally takes tramadol now.  His blood sugar 1000 mg twice daily is in the 1 02/26/2019 range.  He gets routine eye care......... needs his cataracts removed with lens implants but the amputation is taken priority  He gets regular dental care.  BP 120/70 (BP Location: Left Arm, Patient Position: Sitting, Cuff Size: Normal)   Temp 97.8 F (36.6 C) (Oral)   Wt 188 lb (85.3 kg)   BMI 25.50 kg/m  Jellies well-developed well-nourished man with an excellent attitude  #1 diabetes type 2.......Marland Kitchen recent complication right BKA  #2 hypertension at goal........ continue current therapy  #3 bilateral cataracts........Marland Kitchen  #4 status post right BKA.......... currently being fitted for prosthesis

## 2016-09-10 NOTE — Patient Instructions (Addendum)
Tetanus booster today  Call your insurance company and find out where he get the shingles vaccine  Continue current medications  Labs today............. I will call you the reports if there is any changes we need to make in your medications   Okay to get your cataracts removed and lens implants ASAP

## 2016-09-11 LAB — CBC WITH DIFFERENTIAL/PLATELET
Basophils Absolute: 0.1 10*3/uL (ref 0.0–0.1)
Basophils Relative: 1.3 % (ref 0.0–3.0)
Eosinophils Absolute: 0.3 10*3/uL (ref 0.0–0.7)
Eosinophils Relative: 4.7 % (ref 0.0–5.0)
HCT: 39.6 % (ref 39.0–52.0)
Hemoglobin: 13.5 g/dL (ref 13.0–17.0)
Lymphocytes Relative: 32.1 % (ref 12.0–46.0)
Lymphs Abs: 2.1 10*3/uL (ref 0.7–4.0)
MCHC: 34.1 g/dL (ref 30.0–36.0)
MCV: 84.9 fl (ref 78.0–100.0)
Monocytes Absolute: 0.6 10*3/uL (ref 0.1–1.0)
Monocytes Relative: 10 % (ref 3.0–12.0)
Neutro Abs: 3.3 10*3/uL (ref 1.4–7.7)
Neutrophils Relative %: 51.9 % (ref 43.0–77.0)
Platelets: 250 10*3/uL (ref 150.0–400.0)
RBC: 4.67 Mil/uL (ref 4.22–5.81)
RDW: 13.1 % (ref 11.5–15.5)
WBC: 6.4 10*3/uL (ref 4.0–10.5)

## 2016-09-11 LAB — LIPID PANEL
Cholesterol: 145 mg/dL (ref 0–200)
HDL: 31.3 mg/dL — ABNORMAL LOW (ref >39.00)
NonHDL: 113.31
Total CHOL/HDL Ratio: 5
Triglycerides: 212 mg/dL — ABNORMAL HIGH (ref 0.0–149.0)
VLDL: 42.4 mg/dL — ABNORMAL HIGH (ref 0.0–40.0)

## 2016-09-11 LAB — LDL CHOLESTEROL, DIRECT: Direct LDL: 84 mg/dL

## 2016-09-11 LAB — BASIC METABOLIC PANEL
BUN: 20 mg/dL (ref 6–23)
CO2: 29 mEq/L (ref 19–32)
Calcium: 9.6 mg/dL (ref 8.4–10.5)
Chloride: 103 mEq/L (ref 96–112)
Creatinine, Ser: 0.77 mg/dL (ref 0.40–1.50)
GFR: 104.15 mL/min (ref >60.00)
Glucose, Bld: 132 mg/dL — ABNORMAL HIGH (ref 70–99)
Potassium: 3.8 mEq/L (ref 3.5–5.1)
Sodium: 141 mEq/L (ref 135–145)

## 2016-09-11 LAB — HEPATIC FUNCTION PANEL
ALT: 13 U/L (ref 0–53)
AST: 12 U/L (ref 0–37)
Albumin: 4.1 g/dL (ref 3.5–5.2)
Alkaline Phosphatase: 63 U/L (ref 39–117)
Bilirubin, Direct: 0.1 mg/dL (ref 0.0–0.3)
Total Bilirubin: 0.4 mg/dL (ref 0.2–1.2)
Total Protein: 6.7 g/dL (ref 6.0–8.3)

## 2016-09-11 LAB — TSH: TSH: 1.8 u[IU]/mL (ref 0.35–4.50)

## 2016-09-11 LAB — MICROALBUMIN / CREATININE URINE RATIO
Creatinine,U: 106.5 mg/dL
Microalb Creat Ratio: 0.7 mg/g (ref 0.0–30.0)
Microalb, Ur: 0.7 mg/dL (ref 0.0–1.9)

## 2016-09-11 LAB — HEMOGLOBIN A1C: Hgb A1c MFr Bld: 5.9 % (ref 4.6–6.5)

## 2016-09-12 ENCOUNTER — Ambulatory Visit: Payer: Medicare Other | Attending: Orthopedic Surgery | Admitting: Physical Therapy

## 2016-09-12 ENCOUNTER — Encounter: Payer: Self-pay | Admitting: Physical Therapy

## 2016-09-12 DIAGNOSIS — M6281 Muscle weakness (generalized): Secondary | ICD-10-CM | POA: Insufficient documentation

## 2016-09-12 DIAGNOSIS — R2681 Unsteadiness on feet: Secondary | ICD-10-CM

## 2016-09-12 DIAGNOSIS — M6249 Contracture of muscle, multiple sites: Secondary | ICD-10-CM | POA: Insufficient documentation

## 2016-09-12 DIAGNOSIS — R2689 Other abnormalities of gait and mobility: Secondary | ICD-10-CM | POA: Insufficient documentation

## 2016-09-12 NOTE — Therapy (Signed)
[facility] [address] [place], [place], [address] Phone: [phone]   Fax:  [phone]  Physical Therapy Evaluation  Patient Details  Name: [name] MRN: [id number] Date of Birth: 1940-08-06 Referring Provider: Meridee Score MD   Encounter Date: 09/12/2016      PT End of Session - 09/12/16 1543    Visit Number 1   Number of Visits 18   Date for PT Re-Evaluation 11/09/16   Authorization Type Medicare and G-codes    PT Start Time 1230   PT Stop Time 1315   PT Time Calculation (min) 45 min   Equipment Utilized During Treatment Gait belt   Activity Tolerance Patient tolerated treatment well   Behavior During Therapy [facility] for tasks assessed/performed      Past Medical History:  Diagnosis Date  . CAD (coronary artery disease)    Minimal plaque cath 2008  . Cataract    both  . Complication of anesthesia   . Diabetes mellitus   . ED (erectile dysfunction)   . Hypertension   . Osteoarthritis   . PONV (postoperative nausea and vomiting) 1960   with Ether  . Rheumatic fever   . Rhinitis     Past Surgical History:  Procedure Laterality Date  . AMPUTATION Right 02/29/2016   Procedure: Right Leg AMPUTATION BELOW KNEE with Wound Vac placement;  Surgeon: [name], MD;  Location: [facility];  Service: Orthopedics;  Laterality: Right;  . CHOLECYSTECTOMY  1983  . colonoscopy  2006, 2010  . INGUINAL HERNIA REPAIR Right 1960    There were no vitals filed for this visit.       Subjective Assessment - 09/12/16 1239    Subjective Patient is a 76 year old male who reports to PT in a wheelchair with prosthesis in lap and with his grandson [name]). Patient reports to PT s/p R transtibial amputation on 89/07/8099 due to complications from a diabetic wound.  Prior to surgery, the patient reports being independent and very active playing golf and taking care of his wife, who is in a wheelchair with Parkinson's disease.  The patient does not report any falls or any fear of falling. Patient does not report any pain. He denies skin integrity issues on his LLE, but reports sensation is diminished. The patient would like to return to being independent, playing golf, and being able to care for his wife, which will enable them to reduce the amount of time they are reliant upon an in home care attendant for her.   Patient is accompained by: Family member  Grandson - [name]    Pertinent History R transtibial amputation, DM2, HTN, B cataracts, diabetic polyneuropathy, CAD, SVT, CHF, basal cell cancer on his face, OA    Limitations Lifting;Standing;Walking;House hold activities   Patient Stated Goals 18 holes of golf (using cart), walk and get around independently, get back on feet to take care of wife (wheelchair bound with PD)    Currently in Pain? No/denies            [facility] PT Assessment - 09/12/16 1230      Assessment   Medical Diagnosis R transtibial amputation    Referring Provider Meridee Score MD    Onset Date/Surgical Date 09/06/16  received prosthesis    Hand Dominance Left     Precautions   Precautions Fall     Balance Screen   Has the patient fallen in the past 6 months No   Has  the patient had a decrease in activity level because of a fear of falling?  No   Is the patient reluctant to leave their home because of a fear of falling?  No     Home Environment   Living Environment Private residence   Living Arrangements Spouse/significant other   Available Help at Discharge Family;Personal care attendant  wife - wheelchair bound due to PD   Type of Home Other(Comment)  townhouse   Home Access Level entry  threshold to get in door   [place] One level   Home Equipment Wheelchair - manual;Walker - 2 wheels;Walker - 4 wheels;Shower seat - built in;Grab bars - tub/shower;Grab bars - toilet   Additional Comments The personal care attendent is primarily for his wife. Patient reports he needs to get  back to caring for her so he can reduce the number of hours they need in home assistance.     Prior Function   Level of Independence Independent   Vocation Retired   Leisure riding and playing 18 holes of golf, walking      Posture/Postural Control   Posture/Postural Control Postural limitations   Postural Limitations Rounded Shoulders;Forward head;Increased thoracic kyphosis;Flexed trunk     ROM / Strength   AROM / PROM / Strength AROM;Strength     AROM   AROM Assessment Site Knee;Ankle   Right/Left Knee Right;Left   Right Knee Extension -10   Left Knee Extension -15   Right/Left Ankle Left   Left Ankle Dorsiflexion -4     Strength   Overall Strength Within functional limits for tasks performed   Overall Strength Comments Gross, normal strength in BUE and LLE. R hip flexion, knee extension, and knee flexion were grossly normal, too.     Transfers   Transfers Sit to Stand;Stand to Sit   Sit to Stand With upper extremity assist;With armrests;From chair/3-in-1;4: Min guard   Stand to Sit 5: Supervision;With upper extremity assist;With armrests;To chair/3-in-1   Stand to Sit Details requires cueing to back to up chair prior to descending to maintain safety     Ambulation/Gait   Ambulation/Gait Yes   Ambulation/Gait Assistance 4: Min guard   Ambulation Distance (Feet) 70 Feet   Assistive device Rolling walker;Prosthesis   Gait Pattern Step-to pattern;Decreased step length - left;Decreased stance time - right;Decreased stride length;Decreased dorsiflexion - left;Decreased weight shift to right;Trunk flexed;Abducted - left;Poor foot clearance - left  externally rotated LLE (toed out)    Ambulation Surface Level;Indoor   Gait velocity 0.36ft/s     Standardized Balance Assessment   Standardized Balance Assessment Berg Balance Test     Berg Balance Test   Sit to Stand Able to stand  independently using hands   Standing Unsupported Able to stand 2 minutes with supervision    Sitting with Back Unsupported but Feet Supported on Floor or Stool Able to sit safely and securely 2 minutes   Stand to Sit Uses backs of legs against chair to control descent   Transfers Able to transfer with verbal cueing and /or supervision   Standing Unsupported with Eyes Closed Able to stand 3 seconds   Standing Ubsupported with Feet Together Needs help to attain position but able to stand for 30 seconds with feet together   From Standing, Reach Forward with Outstretched Arm Reaches forward but needs supervision   From Standing Position, Pick up Object from Floor Unable to pick up shoe, but reaches 2-5 cm (1-2") from shoe and balances independently  From Standing Position, Turn to Look Behind Over each Shoulder Turn sideways only but maintains balance   Turn 360 Degrees Needs assistance while turning   Standing Unsupported, Alternately Place Feet on Step/Stool Needs assistance to keep from falling or unable to try   Standing Unsupported, One Foot in Front Needs help to step but can hold 15 seconds   Standing on One Leg Unable to try or needs assist to prevent fall   Total Score 23         Prosthetics Assessment - 09/12/16 0001      Prosthetics   Prosthetic Care Dependent with Skin check;Residual limb care;Prosthetic cleaning;Ply sock cleaning;Correct ply sock adjustment;Proper wear schedule/adjustment;Proper weight-bearing schedule/adjustment;Care of non-amputated limb   Donning prosthesis  Min assist   Doffing prosthesis  Min assist   Current prosthetic wear tolerance (days/week)  worn 6/6 days since delivery    Current prosthetic wear tolerance (#hours/day)  1hr at a time 2x/day   Current prosthetic weight-bearing tolerance (hours/day)  patient tolerated standing for 7 minutes with partial weight on prosthesis with intermittent UE support on RW without complaint of pain    Edema non-pitting    Residual limb condition  2 scabs on scar (lateral inscision and distal tibia) with mild  adherence at distal tibia. Normal color relative to LLE. Mild bruising on lateral compartment from hitting it on the edge of his wheelchair. Light hair growth indicating a decrease in circulation. Mild dryness and normal temperature.   K code/activity level with prosthetic use  K3                   [facility] Adult PT Treatment/Exercise - 09/12/16 1230      Prosthetics   Prosthetic Care Comments  Patient instructed to increase wear to 3hrs 2x/day   Education Provided Skin check;Residual limb care;Prosthetic cleaning;Proper Donning;Proper Doffing;Proper wear schedule/adjustment;Proper weight-bearing schedule/adjustment   Person(s) Educated Patient;Other (comment)  grandson   Education Method Demonstration;Explanation;Tactile cues;Verbal cues   Education Method Verbalized understanding;Verbal cues required;Needs further instruction                PT Education - 09/12/16 1540    Education provided Yes   Education Details plan of care, education that he is at a high risk of falling and needs to take precautions    Person(s) Educated Patient   Methods Explanation;Verbal cues   Comprehension Verbalized understanding          PT Short Term Goals - 09/12/16 1605      PT SHORT TERM GOAL #1   Title Patient will verbalize understanding of initial HEP. (TARGET DATE: 10/05/2016)   Time 4   Period Weeks   Status New     PT SHORT TERM GOAL #2   Title Patient will report wearing prosthesis > or = 8 hrs/day without signs of skin integrity issues. (TARGET DATE: 10/05/2016)   Time 4   Period Weeks   Status New     PT SHORT TERM GOAL #3   Title Patient will demonsrate ability to ambulate with supervision with RW and prosthesis 300 feet including ramps and curbs.    Time 4   Period Weeks   Status New     PT SHORT TERM GOAL #4   Title Patient will demonstrate ability to safely reach forward 3 inches in standing with prosthesis and supervision to indicate a decrease in his risk of  falling. (TARGET DATE: 10/05/2016)   Time 4   Period [place]  Status New     PT SHORT TERM GOAL #5   Title Patient will independently demonstrate ability to don and doff prosthesis. (TARGET DATE: 10/05/2016)   Time 4   Period Weeks   Status New           PT Long Term Goals - 09/12/16 1559      PT LONG TERM GOAL #1   Title Patient will verbalize understanding of ongoing HEP and fitness program. (TARGET DATE: 11/09/2016)   Time 8   Period Weeks   Status New     PT LONG TERM GOAL #2   Title Patient will report wearing prosthesis >90% of all waking hours without skin integrity issues to decrease his risk of falling. (TARGET DAT: 11/09/2016)    Time 8   Period Weeks   Status New     PT LONG TERM GOAL #3   Title Patient will have a gait speed of > or = 1.18ft/s to indicate he is safe as a limited community ambulator. (TARGET DATE: 11/09/2016)    Time 8   Period Weeks   Status New     PT LONG TERM GOAL #4   Title Patient will achieve a Berg Balance Test score of >45 to indicate a decreased risk of falling. (TARGET DATE: 11/09/2016)    Time 8   Period Weeks   Status New     PT LONG TERM GOAL #5   Title Patient will demonstrate ability to ambulate 500 feet with LRAD and prosthesis and supervision over unlevel surfaces (grass, curbs, and ramps) to indicate being able to return to patient's goal of golfing. (TARGET DATE: 11/09/2016)   Time 8   Period Weeks   Status New     Additional Long Term Goals   Additional Long Term Goals Yes     PT LONG TERM GOAL #6   Title Patient will complete the Functional Gait Assessment, and goal will be set. (TARGET DATE: 11/09/2016)   Time 8   Period Weeks   Status Unable to assess     PT LONG TERM GOAL #7   Title Patient will verbalize understanding and demonstrate proper prosthetic care to demonstrate safety in using prosthesis. (TARGET DATE: 11/09/2016)   Time 8   Period Weeks   Status New               Plan - 09/12/16 1545     Clinical Impression Statement Patient is a 76 year old male presenting to PT s/p R transtibial amputation on 25/0/5397 due to complications from a diabetic wound. The patient's past medical history is significant for DM2, HTN, B cataracts, diabetic polyneuropathy, CAD, SVT, CHF, basal cell cancer on his face, OA. Prior to surgery, the patient was independent, playing 18 holes of golf with a golf cart, and helping to care for his wife who is in a wheelchair with Parkinson's disease. They currently have a personal care attendent in the home, but patient reports he needs to get back to caring for his wife to be able to reduce the number of hours they require in personal attendent. Patient is dependent in prosthetic wear, which places him at an increased risk of skin integrity issues. Patient is not currently wearing the limb >1hr at a time placing him at an increased risk of falling. Patient is at an increased risk of falling due to a decreased gait speed of 0.57ft/s with a RW and prosthesis indicating a household ambulator. Additionally, the patient scored a 23/56  on the [employer] indicating he is at an increased risk of falling. The patient's case is evolving and is of moderate complexity. The patient's prognosis is good should he receive skilled PT to address his deficts in mobility and balance.    Rehab Potential Good   Clinical Impairments Affecting Rehab Potential DM2, HTN, B cataracts, diabetic polyneuropathy, CAD, SVT, CHF, basal cell cancer on face, OA    PT Frequency 2x / week   PT Duration Other (comment)  9 weeks   PT Treatment/Interventions ADLs/Self Care Home Management;DME Instruction;Gait training;Stair training;Functional mobility training;Therapeutic activities;Therapeutic exercise;Balance training;Prosthetic Training;Orthotic Fit/Training;Patient/family education;Neuromuscular re-education;Manual techniques   PT Next Visit Plan initiate HEP, balance and gait exercises    Consulted and  Agree with Plan of Care Patient      Patient will benefit from skilled therapeutic intervention in order to improve the following deficits and impairments:  Abnormal gait, Decreased activity tolerance, Decreased balance, Decreased knowledge of precautions, Decreased endurance, Decreased knowledge of use of DME, Decreased mobility, Decreased range of motion, Decreased safety awareness, Difficulty walking, Decreased strength, Impaired sensation, Prosthetic Dependency, Postural dysfunction, Improper body mechanics, Pain  Visit Diagnosis: Unsteadiness on feet  Other abnormalities of gait and mobility  Muscle weakness (generalized)  Contracture of muscle, multiple sites      G-Codes - Oct 09, 2016 1618    Functional Assessment Tool Used (Outpatient Only) Patient is dependent in proper prosthetic care. He is wearing prosthesis only 1hr 2x/day with 2 wounds on residual limb.    Functional Limitation Self care   Self Care Current Status [id number]) At least 60 percent but less than 80 percent impaired, limited or restricted   Self Care Goal Status (W5809) At least 1 percent but less than 20 percent impaired, limited or restricted       Problem List Patient Active Problem List   Diagnosis Date Noted  . Diabetic polyneuropathy associated with type 2 diabetes mellitus ([place]) 05/31/2016  . H/O amputation of leg through tibia and fibula, right ([place]) 05/03/2016  . Phantom limb pain ([place]) 03/06/2016  . Heat rash 03/06/2016  . Below knee amputation status ([place]) 02/29/2016  . SVT (supraventricular tachycardia) ([place]) 01/20/2016  . Chronic systolic heart failure ([place]) 01/20/2016  . Sepsis ([place]) 12/10/2015  . Diabetic foot ulcers ([place]) 07/12/2015  . Dysphagia 08/06/2014  . Anxiety state 10/26/2013  . PVC (premature ventricular contraction) 07/27/2011  . Diabetes mellitus type II, uncontrolled ([place]) 11/02/2010  . BASAL CELL CARCINOMA, FACE 10/18/2008  . BASAL CELL CARCINOMA, FACE 10/18/2008  . LIPOMA,  SKIN 05/31/2008  . DRY SKIN 05/31/2008  . ERECTILE DYSFUNCTION 09/30/2007  . OSTEOARTHRITIS 08/08/2007  . Essential hypertension 03/24/2007    Arelia Sneddon, SPT Oct 09, 2016, 5:46 PM  Jamey Reas, PT, DPT PT [facility]izing [facility] [place] 09-Oct-2016 7:42 PM Phone:  [phone]  Fax:  [phone] [place],  [id number]  [facility] [address] [place], [place], [address] Phone: [phone]   Fax:  [phone]  Name: [name] MRN: [id number] Date of Birth: 26-Dec-1940

## 2016-09-14 ENCOUNTER — Ambulatory Visit: Payer: Medicare Other | Admitting: Rehabilitation

## 2016-09-14 ENCOUNTER — Encounter: Payer: Self-pay | Admitting: Rehabilitation

## 2016-09-14 DIAGNOSIS — M6281 Muscle weakness (generalized): Secondary | ICD-10-CM

## 2016-09-14 DIAGNOSIS — R2689 Other abnormalities of gait and mobility: Secondary | ICD-10-CM

## 2016-09-14 DIAGNOSIS — R2681 Unsteadiness on feet: Secondary | ICD-10-CM | POA: Diagnosis not present

## 2016-09-14 DIAGNOSIS — M6249 Contracture of muscle, multiple sites: Secondary | ICD-10-CM | POA: Diagnosis not present

## 2016-09-14 NOTE — Patient Instructions (Addendum)
Do each exercise 2  times per day Do each exercise 10 repetitions Hold each exercise for 5-7 seconds to feel your location  AT [place].  USE TAPE ON FLOOR TO MARK THE MIDLINE POSITION. You also should try to feel with your limb pressure in socket.  You are trying to feel with limb what you used to feel with the bottom of your foot.  1. Side to Side Shift: Moving your hips only (not shoulders): move weight onto your left leg, HOLD/FEEL.  Move back to equal weight on each leg, HOLD/FEEL. Move weight onto your right leg, HOLD/FEEL. Move back to equal weight on each leg, HOLD/FEEL. Repeat. 2. Front to Back Shift: Moving your hips only (not shoulders): move your weight forward onto your toes, HOLD/FEEL. Move your weight back to equal Flat Foot on both legs, HOLD/FEEL. Move your weight back onto your heels, HOLD/FEEL. Move your weight back to equal on both legs, HOLD/FEEL. Repeat. 3. Moving Cones / Cups: With equal weight on each leg: Hold on with one hand the first time, then progress to no hand supports. Move cups from one side of sink to the other. Place cups ~2" out of your reach, progress to 10" beyond reach. 4. Overhead/Upward Reaching: alternated reaching up to top cabinets or ceiling if no cabinets present. Keep equal weight on each leg. Start with one hand support on counter while other hand reaches and progress to no hand support with reaching. 5.   Looking Over Shoulders: With equal weight on each leg: alternate turning to look over your shoulders with one hand support on counter as needed. Shift weight to right side looking, pull hip then shoulder then head/eyes around to look behind you. Start  with one hand support & progress to no hand support.

## 2016-09-14 NOTE — Therapy (Signed)
[facility] [address] [place], [place], [address] Phone: [phone]   Fax:  [phone]  Physical Therapy Treatment  Patient Details  Name: [name] MRN: [id number] Date of Birth: 1940/07/25 Referring Provider: Meridee Score MD   Encounter Date: 09/14/2016      PT End of Session - 09/14/16 September 19, 2047    Visit Number 2   Number of Visits 18   Date for PT Re-Evaluation 11/09/16   Authorization Type Medicare and G-codes    PT Start Time 09/19/1538   PT Stop Time 1625   PT Time Calculation (min) 45 min   Equipment Utilized During Treatment Gait belt   Activity Tolerance Patient tolerated treatment well   Behavior During Therapy [facility] for tasks assessed/performed      Past Medical History:  Diagnosis Date  . CAD (coronary artery disease)    Minimal plaque cath 09-19-2006  . Cataract    both  . Complication of anesthesia   . Diabetes mellitus   . ED (erectile dysfunction)   . Hypertension   . Osteoarthritis   . PONV (postoperative nausea and vomiting) 1960   with Ether  . Rheumatic fever   . Rhinitis     Past Surgical History:  Procedure Laterality Date  . AMPUTATION Right 02/29/2016   Procedure: Right Leg AMPUTATION BELOW KNEE with Wound Vac placement;  Surgeon: [name], MD;  Location: [facility];  Service: Orthopedics;  Laterality: Right;  . CHOLECYSTECTOMY  1983  . colonoscopy  2006, 2010  . INGUINAL HERNIA REPAIR Right 1960    There were no vitals filed for this visit.      Subjective Assessment - 09/14/16 1545    Subjective Pt reports he is wearing prosthesis 3 hours 2x/day.     Patient is accompained by: Family member   Pertinent History R transtibial amputation, DM2, HTN, B cataracts, diabetic polyneuropathy, CAD, SVT, CHF, basal cell cancer on his face, OA    Limitations Lifting;Standing;Walking;House hold activities   Patient Stated Goals 18 holes of golf (using cart), walk and get around independently,  get back on feet to take care of wife (wheelchair bound with PD)    Currently in Pain? No/denies                         [facility] Adult PT Treatment/Exercise - 09/14/16 0001      Transfers   Transfers Sit to Stand;Stand to Sit   Sit to Stand 5: Supervision;With armrests;With upper extremity assist   Sit to Stand Details (indicate cue type and reason) Cues for scooting to edge of chair, ensuring feet are even for equal LE activation and forward weight shift.     Stand to Sit 5: Supervision;With upper extremity assist;With armrests;To chair/3-in-1     Ambulation/Gait   Ambulation/Gait Yes   Ambulation/Gait Assistance 5: Supervision   Ambulation/Gait Assistance Details Pt ambulated with RW x 115' x 1, 135' x 1 with RW during session.  Cues for upright posture and improved stride length.  Tolerated very well.  Also had pt negotiate through tight spaces during session to better simulate home.  Did well with cues for technique.    Assistive device Rolling walker;Prosthesis   Gait Pattern Step-to pattern;Decreased step length - left;Decreased stance time - right;Decreased stride length;Decreased dorsiflexion - left;Decreased weight shift to right;Trunk flexed;Abducted - left;Poor foot clearance - left   Ambulation Surface Level;Indoor   Ramp 4: Min assist  Ramp Details (indicate cue type and reason) Cues for sequencing and technique   Curb 4: Min assist   Curb Details (indicate cue type and reason) Cues for sequencing and technique with RW.  Performed x 2 reps during session.      Exercises   Exercises Other Exercises   Other Exercises  Had pt perform sink exercises in order to work on improving weight shift to RLE and improved balance, see pt instruction for details.      Prosthetics   Prosthetic Care Comments  Pt instructed to continue wearing prosthesis 3hrs 2x/day for at least 1 week before increasing   Current prosthetic wear tolerance (#hours/day)  3hrs 2x/day   Edema  non-pitting    Education Provided Skin check;Residual limb care;Proper weight-bearing schedule/adjustment;Proper wear schedule/adjustment   Person(s) Educated Patient;Child(ren)   Education Method Explanation;Demonstration;Verbal cues   Education Method Verbalized understanding;Needs further instruction                PT Education - 09/14/16 2048    Education provided Yes   Education Details HEP at sink, begin gait in home at S level    Person(s) Educated Patient;Child(ren)   Methods Explanation;Demonstration;Handout   Comprehension Verbalized understanding;Returned demonstration          PT Short Term Goals - 09/12/16 1605      PT SHORT TERM GOAL #1   Title Patient will verbalize understanding of initial HEP. (TARGET DATE: 10/05/2016)   Time 4   Period Weeks   Status New     PT SHORT TERM GOAL #2   Title Patient will report wearing prosthesis > or = 8 hrs/day without signs of skin integrity issues. (TARGET DATE: 10/05/2016)   Time 4   Period Weeks   Status New     PT SHORT TERM GOAL #3   Title Patient will demonsrate ability to ambulate with supervision with RW and prosthesis 300 feet including ramps and curbs.    Time 4   Period Weeks   Status New     PT SHORT TERM GOAL #4   Title Patient will demonstrate ability to safely reach forward 3 inches in standing with prosthesis and supervision to indicate a decrease in his risk of falling. (TARGET DATE: 10/05/2016)   Time 4   Period Weeks   Status New     PT SHORT TERM GOAL #5   Title Patient will independently demonstrate ability to don and doff prosthesis. (TARGET DATE: 10/05/2016)   Time 4   Period Weeks   Status New           PT Long Term Goals - 09/12/16 1559      PT LONG TERM GOAL #1   Title Patient will verbalize understanding of ongoing HEP and fitness program. (TARGET DATE: 11/09/2016)   Time 8   Period Weeks   Status New     PT LONG TERM GOAL #2   Title Patient will report wearing prosthesis  >90% of all waking hours without skin integrity issues to decrease his risk of falling. (TARGET DAT: 11/09/2016)    Time 8   Period Weeks   Status New     PT LONG TERM GOAL #3   Title Patient will have a gait speed of > or = 1.65ft/s to indicate he is safe as a limited community ambulator. (TARGET DATE: 11/09/2016)    Time 8   Period Weeks   Status New     PT LONG TERM GOAL #4  Title Patient will achieve a Berg Balance Test score of >45 to indicate a decreased risk of falling. (TARGET DATE: 11/09/2016)    Time 8   Period Weeks   Status New     PT LONG TERM GOAL #5   Title Patient will demonstrate ability to ambulate 500 feet with LRAD and prosthesis and supervision over unlevel surfaces (grass, curbs, and ramps) to indicate being able to return to patient's goal of golfing. (TARGET DATE: 11/09/2016)   Time 8   Period Weeks   Status New     Additional Long Term Goals   Additional Long Term Goals Yes     PT LONG TERM GOAL #6   Title Patient will complete the Functional Gait Assessment, and goal will be set. (TARGET DATE: 11/09/2016)   Time 8   Period Weeks   Status Unable to assess     PT LONG TERM GOAL #7   Title Patient will verbalize understanding and demonstrate proper prosthetic care to demonstrate safety in using prosthesis. (TARGET DATE: 11/09/2016)   Time 8   Period Weeks   Status New               Plan - 09/14/16 2049    Clinical Impression Statement Skilled session focused on initiation of sink level HEP to encourage weight shift and weight bearing through RLE/prosthesis.  Also worked on gait with RW for improved quality with negotiation of curb/ramp.  Tolerated well with no pain/pressure on RLE.  Educated to begin gait at home with S from caregiver.    Rehab Potential Good   Clinical Impairments Affecting Rehab Potential DM2, HTN, B cataracts, diabetic polyneuropathy, CAD, SVT, CHF, basal cell cancer on face, OA    PT Frequency 2x / week   PT Duration Other  (comment)  9 weeks   PT Treatment/Interventions ADLs/Self Care Home Management;DME Instruction;Gait training;Stair training;Functional mobility training;Therapeutic activities;Therapeutic exercise;Balance training;Prosthetic Training;Orthotic Fit/Training;Patient/family education;Neuromuscular re-education;Manual techniques   PT Next Visit Plan check HEP as necessary-add/modify, gait with RW, standing balance with decreasing support   Consulted and Agree with Plan of Care Patient      Patient will benefit from skilled therapeutic intervention in order to improve the following deficits and impairments:  Abnormal gait, Decreased activity tolerance, Decreased balance, Decreased knowledge of precautions, Decreased endurance, Decreased knowledge of use of DME, Decreased mobility, Decreased range of motion, Decreased safety awareness, Difficulty walking, Decreased strength, Impaired sensation, Prosthetic Dependency, Postural dysfunction, Improper body mechanics, Pain  Visit Diagnosis: Unsteadiness on feet  Other abnormalities of gait and mobility  Muscle weakness (generalized)     Problem List Patient Active Problem List   Diagnosis Date Noted  . Diabetic polyneuropathy associated with type 2 diabetes mellitus ([place]) 05/31/2016  . H/O amputation of leg through tibia and fibula, right ([place]) 05/03/2016  . Phantom limb pain ([place]) 03/06/2016  . Heat rash 03/06/2016  . Below knee amputation status ([place]) 02/29/2016  . SVT (supraventricular tachycardia) ([place]) 01/20/2016  . Chronic systolic heart failure ([place]) 01/20/2016  . Sepsis ([place]) 12/10/2015  . Diabetic foot ulcers ([place]) 07/12/2015  . Dysphagia 08/06/2014  . Anxiety state 10/26/2013  . PVC (premature ventricular contraction) 07/27/2011  . Diabetes mellitus type II, uncontrolled ([place]) 11/02/2010  . BASAL CELL CARCINOMA, FACE 10/18/2008  . BASAL CELL CARCINOMA, FACE 10/18/2008  . LIPOMA, SKIN 05/31/2008  . DRY SKIN 05/31/2008  . ERECTILE  DYSFUNCTION 09/30/2007  . OSTEOARTHRITIS 08/08/2007  . Essential hypertension 03/24/2007    Cameron Sprang, PT, MPT  [facility] [address] [place], [place], [address] Phone: [phone]   Fax:  [phone] 09/14/16, 8:52 PM  Name: [name] MRN: [id number] Date of Birth: [birth year]

## 2016-09-17 ENCOUNTER — Encounter: Payer: Self-pay | Admitting: Rehabilitation

## 2016-09-17 ENCOUNTER — Ambulatory Visit: Payer: Medicare Other | Admitting: Rehabilitation

## 2016-09-17 DIAGNOSIS — R2689 Other abnormalities of gait and mobility: Secondary | ICD-10-CM | POA: Diagnosis not present

## 2016-09-17 DIAGNOSIS — R2681 Unsteadiness on feet: Secondary | ICD-10-CM | POA: Diagnosis not present

## 2016-09-17 DIAGNOSIS — M6249 Contracture of muscle, multiple sites: Secondary | ICD-10-CM | POA: Diagnosis not present

## 2016-09-17 DIAGNOSIS — M6281 Muscle weakness (generalized): Secondary | ICD-10-CM

## 2016-09-17 NOTE — Therapy (Signed)
[facility] [address] [place], [place], [address] Phone: [phone]   Fax:  [phone]  Physical Therapy Treatment  Patient Details  Name: [name] MRN: [id number] Date of Birth: 01/03/1941 Referring Provider: Meridee Score MD   Encounter Date: 09/17/2016      PT End of Session - 09/17/16 1918    Visit Number 3   Number of Visits 18   Date for PT Re-Evaluation 11/09/16   Authorization Type Medicare and G-codes    PT Start Time September 18, 1532   PT Stop Time 1617   PT Time Calculation (min) 43 min   Equipment Utilized During Treatment Gait belt   Activity Tolerance Patient tolerated treatment well   Behavior During Therapy [facility] for tasks assessed/performed      Past Medical History:  Diagnosis Date  . CAD (coronary artery disease)    Minimal plaque cath 09-19-2006  . Cataract    both  . Complication of anesthesia   . Diabetes mellitus   . ED (erectile dysfunction)   . Hypertension   . Osteoarthritis   . PONV (postoperative nausea and vomiting) 1960   with Ether  . Rheumatic fever   . Rhinitis     Past Surgical History:  Procedure Laterality Date  . AMPUTATION Right 02/29/2016   Procedure: Right Leg AMPUTATION BELOW KNEE with Wound Vac placement;  Surgeon: [name], MD;  Location: [facility];  Service: Orthopedics;  Laterality: Right;  . CHOLECYSTECTOMY  1983  . colonoscopy  2006, 2010  . INGUINAL HERNIA REPAIR Right 1960    There were no vitals filed for this visit.      Subjective Assessment - 09/17/16 1537    Subjective Pt reports decreased prosthesis wear time over the weekend due to heat rash.     Patient is accompained by: Family member   Pertinent History R transtibial amputation, DM2, HTN, B cataracts, diabetic polyneuropathy, CAD, SVT, CHF, basal cell cancer on his face, OA    Limitations Lifting;Standing;Walking;House hold activities   Patient Stated Goals 18 holes of golf (using cart), walk and  get around independently, get back on feet to take care of wife (wheelchair bound with PD)    Currently in Pain? Yes   Pain Score 3    Pain Location Leg   Pain Orientation Right   Pain Descriptors / Indicators Aching   Pain Type Acute pain   Pain Onset Today   Pain Frequency Intermittent   Aggravating Factors  didn't feel that prosthesis was on accurately                         [facility] Adult PT Treatment/Exercise - 09/17/16 1535      Transfers   Transfers Sit to Stand;Stand to Sit   Sit to Stand 6: Modified independent (Device/Increase time);5: Supervision   Sit to Stand Details (indicate cue type and reason) min cues to scoot to EOC prior to standing.    Stand to Sit 6: Modified independent (Device/Increase time);5: Supervision     Ambulation/Gait   Ambulation/Gait Yes   Ambulation/Gait Assistance 5: Supervision   Ambulation/Gait Assistance Details Performed two bouts of gait x 20' each in order to allow pt to feel what too many ply socks felt like vs having too little vs having the right amount and where to feel pressure vs where to not feel pressure.  Pt S for all gait with good understanding of ply socks during  session.  Also worked on gait with cues to decrease LLE ER and adduction to allow for more WB and loading through RLE.  Pt returned demonstration but will benefit from continued practice.    Ambulation Distance (Feet) 20 Feet  x4 reps, x 115'    Assistive device Rolling walker;Prosthesis   Gait Pattern Step-to pattern;Decreased step length - left;Decreased stance time - right;Decreased stride length;Decreased dorsiflexion - left;Decreased weight shift to right;Trunk flexed;Abducted - left;Poor foot clearance - left   Ambulation Surface Level;Indoor   Ramp 4: Min assist   Ramp Details (indicate cue type and reason) Min/guard for safety with cues for maintaining weight on R heel when descending ramp.    Curb 4: Min assist   Curb Details (indicate cue type and  reason) Had pt attempt to perform without cues to assess carry over from previous session.  Pt continued to want to lead with RLE, causing PT to assist (min A) vs when he led with LLE at S to min/guard level.    Performed x 2 reps      Prosthetics   Prosthetic Care Comments  Went over how to adjust ply socks for appropriate fit into socket.  Had pt ambulate with too many, too little and appropriate amount of socks for improved carryover.  Also had pt don half sock (cut to fit) at upper thigh level to reduce heat rash and catch any excess sweat with education on wearing anti-perspirant on leg to reduce sweating.  Pt verbalized understanding to all as well as daughter.     Current prosthetic wear tolerance (days/week)  Wearing 3 hrs, 2x/day, except for the weekend   Current prosthetic wear tolerance (#hours/day)  Educated to wear 3hrs 2x/day for next two days to ensure heat rash was improved and then increase to 4 hrs 2x/day   Residual limb condition  Note areas of heat rash on anterior aspects of distal residual limb, at knee, posterior and anterior and also at inner thigh.     Education Provided Skin check;Residual limb care;Proper weight-bearing schedule/adjustment;Proper wear schedule/adjustment   Person(s) Educated Patient;Child(ren)   Education Method Explanation;Demonstration;Tactile cues;Verbal cues   Education Method Verbalized understanding;Returned demonstration;Tactile cues required;Verbal cues required;Needs further instruction                PT Education - 09/17/16 1918    Education provided Yes   Education Details see self care   Person(s) Educated Patient   Methods Explanation   Comprehension Verbalized understanding          PT Short Term Goals - 09/12/16 1605      PT SHORT TERM GOAL #1   Title Patient will verbalize understanding of initial HEP. (TARGET DATE: 10/05/2016)   Time 4   Period Weeks   Status New     PT SHORT TERM GOAL #2   Title Patient will  report wearing prosthesis > or = 8 hrs/day without signs of skin integrity issues. (TARGET DATE: 10/05/2016)   Time 4   Period Weeks   Status New     PT SHORT TERM GOAL #3   Title Patient will demonsrate ability to ambulate with supervision with RW and prosthesis 300 feet including ramps and curbs.    Time 4   Period Weeks   Status New     PT SHORT TERM GOAL #4   Title Patient will demonstrate ability to safely reach forward 3 inches in standing with prosthesis and supervision to indicate a decrease in his risk  of falling. (TARGET DATE: 10/05/2016)   Time 4   Period Weeks   Status New     PT SHORT TERM GOAL #5   Title Patient will independently demonstrate ability to don and doff prosthesis. (TARGET DATE: 10/05/2016)   Time 4   Period Weeks   Status New           PT Long Term Goals - 09/12/16 1559      PT LONG TERM GOAL #1   Title Patient will verbalize understanding of ongoing HEP and fitness program. (TARGET DATE: 11/09/2016)   Time 8   Period Weeks   Status New     PT LONG TERM GOAL #2   Title Patient will report wearing prosthesis >90% of all waking hours without skin integrity issues to decrease his risk of falling. (TARGET DAT: 11/09/2016)    Time 8   Period Weeks   Status New     PT LONG TERM GOAL #3   Title Patient will have a gait speed of > or = 1.17ft/s to indicate he is safe as a limited community ambulator. (TARGET DATE: 11/09/2016)    Time 8   Period Weeks   Status New     PT LONG TERM GOAL #4   Title Patient will achieve a Berg Balance Test score of >45 to indicate a decreased risk of falling. (TARGET DATE: 11/09/2016)    Time 8   Period Weeks   Status New     PT LONG TERM GOAL #5   Title Patient will demonstrate ability to ambulate 500 feet with LRAD and prosthesis and supervision over unlevel surfaces (grass, curbs, and ramps) to indicate being able to return to patient's goal of golfing. (TARGET DATE: 11/09/2016)   Time 8   Period Weeks   Status New      Additional Long Term Goals   Additional Long Term Goals Yes     PT LONG TERM GOAL #6   Title Patient will complete the Functional Gait Assessment, and goal will be set. (TARGET DATE: 11/09/2016)   Time 8   Period Weeks   Status Unable to assess     PT LONG TERM GOAL #7   Title Patient will verbalize understanding and demonstrate proper prosthetic care to demonstrate safety in using prosthesis. (TARGET DATE: 11/09/2016)   Time 8   Period Weeks   Status New               Plan - 09/17/16 1918    Clinical Impression Statement Skilled session focused on addressing heat rash/sweat issues, gait with varying ply socks to allow pt awareness of when he has donned too many vs too little, and gait with RW.  Pt continues to make good progress towards goals.     Rehab Potential Good   Clinical Impairments Affecting Rehab Potential DM2, HTN, B cataracts, diabetic polyneuropathy, CAD, SVT, CHF, basal cell cancer on face, OA    PT Frequency 2x / week   PT Duration Other (comment)  9 weeks   PT Treatment/Interventions ADLs/Self Care Home Management;DME Instruction;Gait training;Stair training;Functional mobility training;Therapeutic activities;Therapeutic exercise;Balance training;Prosthetic Training;Orthotic Fit/Training;Patient/family education;Neuromuscular re-education;Manual techniques   PT Next Visit Plan check HEP as necessary-add/modify, gait with RW, standing balance with decreasing support, decreasing LLE ER/add   Consulted and Agree with Plan of Care Patient      Patient will benefit from skilled therapeutic intervention in order to improve the following deficits and impairments:  Abnormal gait, Decreased activity tolerance, Decreased balance,  Decreased knowledge of precautions, Decreased endurance, Decreased knowledge of use of DME, Decreased mobility, Decreased range of motion, Decreased safety awareness, Difficulty walking, Decreased strength, Impaired sensation, Prosthetic  Dependency, Postural dysfunction, Improper body mechanics, Pain  Visit Diagnosis: Unsteadiness on feet  Other abnormalities of gait and mobility  Muscle weakness (generalized)     Problem List Patient Active Problem List   Diagnosis Date Noted  . Diabetic polyneuropathy associated with type 2 diabetes mellitus ([place]) 05/31/2016  . H/O amputation of leg through tibia and fibula, right ([place]) 05/03/2016  . Phantom limb pain ([place]) 03/06/2016  . Heat rash 03/06/2016  . Below knee amputation status ([place]) 02/29/2016  . SVT (supraventricular tachycardia) ([place]) 01/20/2016  . Chronic systolic heart failure ([place]) 01/20/2016  . Sepsis ([employer]) 12/10/2015  . Diabetic foot ulcers ([place]) 07/12/2015  . Dysphagia 08/06/2014  . Anxiety state 10/26/2013  . PVC (premature ventricular contraction) 07/27/2011  . Diabetes mellitus type II, uncontrolled ([place]) 11/02/2010  . BASAL CELL CARCINOMA, FACE 10/18/2008  . BASAL CELL CARCINOMA, FACE 10/18/2008  . LIPOMA, SKIN 05/31/2008  . DRY SKIN 05/31/2008  . ERECTILE DYSFUNCTION 09/30/2007  . OSTEOARTHRITIS 08/08/2007  . Essential hypertension 03/24/2007    Cameron Sprang, PT, MPT [facility] [address] [place], [place], [address] Phone: [phone]   Fax:  [phone] 09/17/16, 7:21 PM  Name: [name] MRN: [id number] Date of Birth: September 17, 1940

## 2016-09-21 ENCOUNTER — Encounter: Payer: Self-pay | Admitting: Rehabilitation

## 2016-09-21 ENCOUNTER — Ambulatory Visit: Payer: Medicare Other | Admitting: Rehabilitation

## 2016-09-21 ENCOUNTER — Telehealth (INDEPENDENT_AMBULATORY_CARE_PROVIDER_SITE_OTHER): Payer: Self-pay | Admitting: Orthopedic Surgery

## 2016-09-21 DIAGNOSIS — M6249 Contracture of muscle, multiple sites: Secondary | ICD-10-CM | POA: Diagnosis not present

## 2016-09-21 DIAGNOSIS — R2681 Unsteadiness on feet: Secondary | ICD-10-CM

## 2016-09-21 DIAGNOSIS — M6281 Muscle weakness (generalized): Secondary | ICD-10-CM | POA: Diagnosis not present

## 2016-09-21 DIAGNOSIS — R2689 Other abnormalities of gait and mobility: Secondary | ICD-10-CM

## 2016-09-21 NOTE — Therapy (Signed)
[facility] [address] [place], [place], [address] Phone: [phone]   Fax:  [phone]  Physical Therapy Treatment  Patient Details  Name: [name] MRN: [id number] Date of Birth: 01/25/1941 Referring Provider: Meridee Score MD   Encounter Date: 09/21/2016      PT End of Session - 09/21/16 1458    Visit Number 4   Number of Visits 18   Date for PT Re-Evaluation 11/09/16   Authorization Type Medicare and G-codes    PT Start Time 0160   PT Stop Time 1532   PT Time Calculation (min) 41 min   Equipment Utilized During Treatment Gait belt   Activity Tolerance Patient tolerated treatment well   Behavior During Therapy [facility] for tasks assessed/performed      Past Medical History:  Diagnosis Date  . CAD (coronary artery disease)    Minimal plaque cath 2008  . Cataract    both  . Complication of anesthesia   . Diabetes mellitus   . ED (erectile dysfunction)   . Hypertension   . Osteoarthritis   . PONV (postoperative nausea and vomiting) 1960   with Ether  . Rheumatic fever   . Rhinitis     Past Surgical History:  Procedure Laterality Date  . AMPUTATION Right 02/29/2016   Procedure: Right Leg AMPUTATION BELOW KNEE with Wound Vac placement;  Surgeon: [name], MD;  Location: [facility];  Service: Orthopedics;  Laterality: Right;  . CHOLECYSTECTOMY  1983  . colonoscopy  2006, 2010  . INGUINAL HERNIA REPAIR Right 1960    There were no vitals filed for this visit.      Subjective Assessment - 09/21/16 1454    Subjective Pt reporting wearing prosthesis up to 4 hours a day, 2 times.  Also states that he went out to eat and walked in the restaraunt.     Patient is accompained by: Family member   Pertinent History R transtibial amputation, DM2, HTN, B cataracts, diabetic polyneuropathy, CAD, SVT, CHF, basal cell cancer on his face, OA    Limitations Lifting;Standing;Walking;House hold activities   Patient  Stated Goals 18 holes of golf (using cart), walk and get around independently, get back on feet to take care of wife (wheelchair bound with PD)    Currently in Pain? No/denies                         [facility] Adult PT Treatment/Exercise - 09/21/16 0001      Ambulation/Gait   Ambulation/Gait Yes   Ambulation/Gait Assistance 5: Supervision   Ambulation/Gait Assistance Details Assessed ambulation over unlevel paved surfaces with RW as he stated he walked into restaraunt this morning.  He did very well and provided clearance to do so with S and to begin walking into clinic rather than using w/c.  Pt and daughter verbalized understnading.      Ambulation Distance (Feet) 200 Feet   Assistive device Rolling walker;Prosthesis   Gait Pattern Step-to pattern;Decreased step length - left;Decreased stance time - right;Decreased stride length;Decreased dorsiflexion - left;Decreased weight shift to right;Trunk flexed;Abducted - left;Poor foot clearance - left   Ambulation Surface Level;Unlevel;Indoor;Outdoor;Paved   Ramp 5: Supervision   Curb 5: Supervision   Curb Details (indicate cue type and reason) Min cues for correct descent sequence, however he was able to recall leading with LLE today.    Pre-Gait Activities Worked on pre-gait activities in // bars in order to work on  improved R lateral weight shift and WB as well as LLE placement to avoid adduction and ER of LLE. Had pt work on stepping over small barrier and back with BUE support x 10 >single UE support with cues for posture and technique.       Self-Care   Self-Care Other Self-Care Comments   Other Self-Care Comments  Briefly discussed and showed pt L gas pedal adaptation for car during session as pt was talking about getting hand controls for car.  Educated that this adapter is much more affordable and allows LEs to continue to control gas/break.  Pt and daughter verbalized understanding.       Neuro Re-ed    Neuro Re-ed Details   While in // bars in order to continue to address RLE WB and weight shift but also dynamic standing balance, had him work on standing at midline without UE support with use of mirror progressing to reaching to the R (PT assisted intermittently with reaching) with cues to look at hand and increase R Lateral weight weight.  Note that when pt truly on RLE, pts leg would shake slightly indicating increased muscle activity.       Prosthetics   Current prosthetic wear tolerance (days/week)  4 hrs, 2x/day   Education Provided Skin check;Residual limb care;Proper weight-bearing schedule/adjustment;Proper wear schedule/adjustment   Person(s) Educated Patient;Child(ren)   Education Method Explanation;Demonstration;Verbal cues   Education Method Verbalized understanding;Returned demonstration;Needs further instruction                  PT Short Term Goals - 09/12/16 1605      PT SHORT TERM GOAL #1   Title Patient will verbalize understanding of initial HEP. (TARGET DATE: 10/05/2016)   Time 4   Period Weeks   Status New     PT SHORT TERM GOAL #2   Title Patient will report wearing prosthesis > or = 8 hrs/day without signs of skin integrity issues. (TARGET DATE: 10/05/2016)   Time 4   Period Weeks   Status New     PT SHORT TERM GOAL #3   Title Patient will demonsrate ability to ambulate with supervision with RW and prosthesis 300 feet including ramps and curbs.    Time 4   Period Weeks   Status New     PT SHORT TERM GOAL #4   Title Patient will demonstrate ability to safely reach forward 3 inches in standing with prosthesis and supervision to indicate a decrease in his risk of falling. (TARGET DATE: 10/05/2016)   Time 4   Period Weeks   Status New     PT SHORT TERM GOAL #5   Title Patient will independently demonstrate ability to don and doff prosthesis. (TARGET DATE: 10/05/2016)   Time 4   Period Weeks   Status New           PT Long Term Goals - 09/12/16 1559      PT LONG  TERM GOAL #1   Title Patient will verbalize understanding of ongoing HEP and fitness program. (TARGET DATE: 11/09/2016)   Time 8   Period Weeks   Status New     PT LONG TERM GOAL #2   Title Patient will report wearing prosthesis >90% of all waking hours without skin integrity issues to decrease his risk of falling. (TARGET DAT: 11/09/2016)    Time 8   Period Weeks   Status New     PT LONG TERM GOAL #3   Title Patient will  have a gait speed of > or = 1.47ft/s to indicate he is safe as a limited community ambulator. (TARGET DATE: 11/09/2016)    Time 8   Period Weeks   Status New     PT LONG TERM GOAL #4   Title Patient will achieve a Berg Balance Test score of >45 to indicate a decreased risk of falling. (TARGET DATE: 11/09/2016)    Time 8   Period Weeks   Status New     PT LONG TERM GOAL #5   Title Patient will demonstrate ability to ambulate 500 feet with LRAD and prosthesis and supervision over unlevel surfaces (grass, curbs, and ramps) to indicate being able to return to patient's goal of golfing. (TARGET DATE: 11/09/2016)   Time 8   Period Weeks   Status New     Additional Long Term Goals   Additional Long Term Goals Yes     PT LONG TERM GOAL #6   Title Patient will complete the Functional Gait Assessment, and goal will be set. (TARGET DATE: 11/09/2016)   Time 8   Period Weeks   Status Unable to assess     PT LONG TERM GOAL #7   Title Patient will verbalize understanding and demonstrate proper prosthetic care to demonstrate safety in using prosthesis. (TARGET DATE: 11/09/2016)   Time 8   Period Weeks   Status New               Plan - 09/21/16 1458    Clinical Impression Statement Skilled session focused on gait over outdoor surfaces and pre-gait and balance activities for improved R lateral weight shift, WB, and L foot placement.    Rehab Potential Good   Clinical Impairments Affecting Rehab Potential DM2, HTN, B cataracts, diabetic polyneuropathy, CAD, SVT, CHF,  basal cell cancer on face, OA    PT Frequency 2x / week   PT Duration Other (comment)  9 weeks   PT Treatment/Interventions ADLs/Self Care Home Management;DME Instruction;Gait training;Stair training;Functional mobility training;Therapeutic activities;Therapeutic exercise;Balance training;Prosthetic Training;Orthotic Fit/Training;Patient/family education;Neuromuscular re-education;Manual techniques   PT Next Visit Plan check HEP as necessary-add/modify, gait with RW, standing balance with decreasing support, decreasing LLE ER/add   Consulted and Agree with Plan of Care Patient      Patient will benefit from skilled therapeutic intervention in order to improve the following deficits and impairments:  Abnormal gait, Decreased activity tolerance, Decreased balance, Decreased knowledge of precautions, Decreased endurance, Decreased knowledge of use of DME, Decreased mobility, Decreased range of motion, Decreased safety awareness, Difficulty walking, Decreased strength, Impaired sensation, Prosthetic Dependency, Postural dysfunction, Improper body mechanics, Pain  Visit Diagnosis: Unsteadiness on feet  Other abnormalities of gait and mobility  Muscle weakness (generalized)     Problem List Patient Active Problem List   Diagnosis Date Noted  . Diabetic polyneuropathy associated with type 2 diabetes mellitus ([place]) 05/31/2016  . H/O amputation of leg through tibia and fibula, right ([place]) 05/03/2016  . Phantom limb pain ([place]) 03/06/2016  . Heat rash 03/06/2016  . Below knee amputation status ([place]) 02/29/2016  . SVT (supraventricular tachycardia) ([place]) 01/20/2016  . Chronic systolic heart failure ([place]) 01/20/2016  . Sepsis ([place]) 12/10/2015  . Diabetic foot ulcers ([place]) 07/12/2015  . Dysphagia 08/06/2014  . Anxiety state 10/26/2013  . PVC (premature ventricular contraction) 07/27/2011  . Diabetes mellitus type II, uncontrolled ([place]) 11/02/2010  . BASAL CELL CARCINOMA, FACE 10/18/2008  .  BASAL CELL CARCINOMA, FACE 10/18/2008  . LIPOMA, SKIN 05/31/2008  . DRY SKIN  05/31/2008  . ERECTILE DYSFUNCTION 09/30/2007  . OSTEOARTHRITIS 08/08/2007  . Essential hypertension 03/24/2007    Cameron Sprang, PT, MPT [facility] [address] [place], [place], [address] Phone: [phone]   Fax:  [phone] 09/21/16, 4:55 PM  Name: RONAV FURNEY MRN: [id number] Date of Birth: 1941-04-19

## 2016-09-21 NOTE — Telephone Encounter (Signed)
Patient called asked if he can be referred to [facility] for driving. The fax# is [phone] The number to contact patient is [phone]

## 2016-09-24 ENCOUNTER — Other Ambulatory Visit (INDEPENDENT_AMBULATORY_CARE_PROVIDER_SITE_OTHER): Payer: Self-pay

## 2016-09-24 NOTE — Telephone Encounter (Signed)
Called and advised pt that letter has been faxed to physical therapy department at [facility] for driving therapy as requested.

## 2016-09-25 ENCOUNTER — Telehealth (INDEPENDENT_AMBULATORY_CARE_PROVIDER_SITE_OTHER): Payer: Self-pay | Admitting: Orthopedic Surgery

## 2016-09-25 NOTE — Telephone Encounter (Signed)
Faxed demo sheet

## 2016-09-25 NOTE — Telephone Encounter (Signed)
[facility] requests demographic information for pt faxed over. We referred pt to them. She said they don't even have a phone number. Please advise.  [phone]

## 2016-09-26 ENCOUNTER — Ambulatory Visit: Payer: Medicare Other | Attending: Orthopedic Surgery | Admitting: Physical Therapy

## 2016-09-26 DIAGNOSIS — R2681 Unsteadiness on feet: Secondary | ICD-10-CM | POA: Diagnosis not present

## 2016-09-26 DIAGNOSIS — M6281 Muscle weakness (generalized): Secondary | ICD-10-CM | POA: Diagnosis not present

## 2016-09-26 DIAGNOSIS — M6249 Contracture of muscle, multiple sites: Secondary | ICD-10-CM | POA: Diagnosis not present

## 2016-09-26 DIAGNOSIS — R2689 Other abnormalities of gait and mobility: Secondary | ICD-10-CM | POA: Diagnosis not present

## 2016-09-26 NOTE — Therapy (Signed)
[facility] [address] [place], [place], [address] Phone: [phone]   Fax:  [phone]  Physical Therapy Treatment  Patient Details  Name: [name] MRN: [id number] Date of Birth: [birth year] Referring Provider: Meridee Score MD   Encounter Date: 09/26/2016      PT End of Session - 09/26/16 0834    Visit Number 5   Number of Visits 18   Date for PT Re-Evaluation 11/09/16   Authorization Type Medicare and G-codes    PT Start Time 0809  pt late for apt today   PT Stop Time 0845   PT Time Calculation (min) 36 min   Equipment Utilized During Treatment Gait belt   Activity Tolerance Patient tolerated treatment well   Behavior During Therapy [facility] for tasks assessed/performed      Past Medical History:  Diagnosis Date  . CAD (coronary artery disease)    Minimal plaque cath 2006/09/12  . Cataract    both  . Complication of anesthesia   . Diabetes mellitus   . ED (erectile dysfunction)   . Hypertension   . Osteoarthritis   . PONV (postoperative nausea and vomiting) 1960   with Ether  . Rheumatic fever   . Rhinitis     Past Surgical History:  Procedure Laterality Date  . AMPUTATION Right 02/29/2016   Procedure: Right Leg AMPUTATION BELOW KNEE with Wound Vac placement;  Surgeon: [name], MD;  Location: [facility];  Service: Orthopedics;  Laterality: Right;  . CHOLECYSTECTOMY  1983  . colonoscopy  2006, 2010  . INGUINAL HERNIA REPAIR Right 1960    There were no vitals filed for this visit.        [facility] Adult PT Treatment/Exercise - 09/26/16 0835      Transfers   Transfers Sit to Stand;Stand to Sit   Sit to Stand 6: Modified independent (Device/Increase time);With upper extremity assist;From chair/3-in-1   Stand to Sit 6: Modified independent (Device/Increase time);With upper extremity assist;To chair/3-in-1     Ambulation/Gait   Ambulation/Gait Yes   Ambulation/Gait Assistance 5: Supervision   Ambulation/Gait Assistance Details cues on posture/to look up with gait, step length and for wider base of support with gait.   Ambulation Distance (Feet) 120 Feet  x2   Assistive device Rolling walker;Prosthesis   Gait Pattern Step-to pattern;Decreased step length - left;Decreased stance time - right;Decreased stride length;Decreased dorsiflexion - left;Decreased weight shift to right;Trunk flexed;Abducted - left;Poor foot clearance - left   Ambulation Surface Level;Indoor   Ramp 5: Supervision   Ramp Details (indicate cue type and reason) cues on posture, walker position and step length    Curb 5: Supervision   Curb Details (indicate cue type and reason) cues on stance position for improved balance with advancing RW down/up curb     Prosthetics   Prosthetic Care Comments  Educated pt on use of anti persperant from knee below and use of baby/mineral oil from knee above to assist with decr heat rash/sweating. Pt verbalized understanding after skilled instruction on purpose of using these items. Also reviewed sock ply management as pt had reddness present on bony prominences today.                Current prosthetic wear tolerance (days/week)  daily   Current prosthetic wear tolerance (#hours/day)  currently wearing for 3-4 hours 2 x day. pt to increase wear time to 4-5 hours 2 x day with checking for sweat at the half way point (2.5  hours into wear) to assist with decr'd heat ras   Residual limb condition  Note areas of heat rash on anterior aspects of distal residual limb, at knee, posterior and anterior and also at inner thigh.     Education Provided Residual limb care;Proper Donning;Proper Doffing;Proper wear schedule/adjustment;Proper weight-bearing schedule/adjustment;Correct ply sock adjustment   Person(s) Educated Patient;Child(ren)   Education Method Explanation;Demonstration;Verbal cues   Education Method Returned demonstration;Verbalized understanding;Verbal cues required   Donning  Prosthesis Supervision   Doffing Prosthesis Supervision            PT Short Term Goals - 09/12/16 1605      PT SHORT TERM GOAL #1   Title Patient will verbalize understanding of initial HEP. (TARGET DATE: 10/05/2016)   Time 4   Period Weeks   Status New     PT SHORT TERM GOAL #2   Title Patient will report wearing prosthesis > or = 8 hrs/day without signs of skin integrity issues. (TARGET DATE: 10/05/2016)   Time 4   Period Weeks   Status New     PT SHORT TERM GOAL #3   Title Patient will demonsrate ability to ambulate with supervision with RW and prosthesis 300 feet including ramps and curbs.    Time 4   Period Weeks   Status New     PT SHORT TERM GOAL #4   Title Patient will demonstrate ability to safely reach forward 3 inches in standing with prosthesis and supervision to indicate a decrease in his risk of falling. (TARGET DATE: 10/05/2016)   Time 4   Period Weeks   Status New     PT SHORT TERM GOAL #5   Title Patient will independently demonstrate ability to don and doff prosthesis. (TARGET DATE: 10/05/2016)   Time 4   Period Weeks   Status New           PT Long Term Goals - 09/12/16 1559      PT LONG TERM GOAL #1   Title Patient will verbalize understanding of ongoing HEP and fitness program. (TARGET DATE: 11/09/2016)   Time 8   Period Weeks   Status New     PT LONG TERM GOAL #2   Title Patient will report wearing prosthesis >90% of all waking hours without skin integrity issues to decrease his risk of falling. (TARGET DAT: 11/09/2016)    Time 8   Period Weeks   Status New     PT LONG TERM GOAL #3   Title Patient will have a gait speed of > or = 1.68ft/s to indicate he is safe as a limited community ambulator. (TARGET DATE: 11/09/2016)    Time 8   Period Weeks   Status New     PT LONG TERM GOAL #4   Title Patient will achieve a Berg Balance Test score of >45 to indicate a decreased risk of falling. (TARGET DATE: 11/09/2016)    Time 8   Period Weeks    Status New     PT LONG TERM GOAL #5   Title Patient will demonstrate ability to ambulate 500 feet with LRAD and prosthesis and supervision over unlevel surfaces (grass, curbs, and ramps) to indicate being able to return to patient's goal of golfing. (TARGET DATE: 11/09/2016)   Time 8   Period Weeks   Status New     Additional Long Term Goals   Additional Long Term Goals Yes     PT LONG TERM GOAL #6   Title Patient will  complete the Functional Gait Assessment, and goal will be set. (TARGET DATE: 11/09/2016)   Time 8   Period Weeks   Status Unable to assess     PT LONG TERM GOAL #7   Title Patient will verbalize understanding and demonstrate proper prosthetic care to demonstrate safety in using prosthesis. (TARGET DATE: 11/09/2016)   Time 8   Period Weeks   Status New            Plan - 09/26/16 3810    Clinical Impression Statement today's skilled session focused on prosthesis manangement, skin care to decreased heat rash and mobility with prosthesis/RW. Pt is making steady progress toward goals and should benefit from continued PT to progress toward unmet goals.    Rehab Potential Good   Clinical Impairments Affecting Rehab Potential DM2, HTN, B cataracts, diabetic polyneuropathy, CAD, SVT, CHF, basal cell cancer on face, OA    PT Frequency 2x / week   PT Duration Other (comment)  9 weeks   PT Treatment/Interventions ADLs/Self Care Home Management;DME Instruction;Gait training;Stair training;Functional mobility training;Therapeutic activities;Therapeutic exercise;Balance training;Prosthetic Training;Orthotic Fit/Training;Patient/family education;Neuromuscular re-education;Manual techniques   PT Next Visit Plan  gait/barriers with RW/prosthesis, standing balance with decreasing support, progress to cane as able with gait   Consulted and Agree with Plan of Care Patient      Patient will benefit from skilled therapeutic intervention in order to improve the following deficits and  impairments:  Abnormal gait, Decreased activity tolerance, Decreased balance, Decreased knowledge of precautions, Decreased endurance, Decreased knowledge of use of DME, Decreased mobility, Decreased range of motion, Decreased safety awareness, Difficulty walking, Decreased strength, Impaired sensation, Prosthetic Dependency, Postural dysfunction, Improper body mechanics, Pain  Visit Diagnosis: Unsteadiness on feet  Other abnormalities of gait and mobility  Muscle weakness (generalized)     Problem List Patient Active Problem List   Diagnosis Date Noted  . Diabetic polyneuropathy associated with type 2 diabetes mellitus ([place]) 05/31/2016  . H/O amputation of leg through tibia and fibula, right ([place]) 05/03/2016  . Phantom limb pain ([place]) 03/06/2016  . Heat rash 03/06/2016  . Below knee amputation status ([place]) 02/29/2016  . SVT (supraventricular tachycardia) ([place]) 01/20/2016  . Chronic systolic heart failure ([place]) 01/20/2016  . Sepsis ([place]) 12/10/2015  . Diabetic foot ulcers ([place]) 07/12/2015  . Dysphagia 08/06/2014  . Anxiety state 10/26/2013  . PVC (premature ventricular contraction) 07/27/2011  . Diabetes mellitus type II, uncontrolled ([place]) 11/02/2010  . BASAL CELL CARCINOMA, FACE 10/18/2008  . BASAL CELL CARCINOMA, FACE 10/18/2008  . LIPOMA, SKIN 05/31/2008  . DRY SKIN 05/31/2008  . ERECTILE DYSFUNCTION 09/30/2007  . OSTEOARTHRITIS 08/08/2007  . Essential hypertension 03/24/2007    Willow Ora, PTA, [facility] [address], [place],  [address] [phone] 09/26/16, 11:55 AM   Name: [name] MRN: [id number] Date of Birth: 1940/11/15

## 2016-09-27 ENCOUNTER — Encounter: Payer: Self-pay | Admitting: Physical Therapy

## 2016-09-28 ENCOUNTER — Encounter: Payer: Self-pay | Admitting: Physical Therapy

## 2016-09-28 ENCOUNTER — Ambulatory Visit: Payer: Medicare Other | Admitting: Physical Therapy

## 2016-09-28 DIAGNOSIS — M6281 Muscle weakness (generalized): Secondary | ICD-10-CM

## 2016-09-28 DIAGNOSIS — M6249 Contracture of muscle, multiple sites: Secondary | ICD-10-CM | POA: Diagnosis not present

## 2016-09-28 DIAGNOSIS — R2681 Unsteadiness on feet: Secondary | ICD-10-CM

## 2016-09-28 DIAGNOSIS — R2689 Other abnormalities of gait and mobility: Secondary | ICD-10-CM | POA: Diagnosis not present

## 2016-09-28 NOTE — Therapy (Signed)
[facility] [name] [place], [place], [address] Phone: [phone]   Fax:  [phone]  Physical Therapy Treatment  Patient Details  Name: [name] MRN: [id number] Date of Birth: 1940-12-20 Referring Provider: Meridee Score MD   Encounter Date: 09/28/2016      PT End of Session - 09/28/16 1628    Visit Number 6   Number of Visits 18   Date for PT Re-Evaluation 11/09/16   Authorization Type Medicare and G-codes    PT Start Time 09-17-1531   PT Stop Time 1615   PT Time Calculation (min) 42 min   Equipment Utilized During Treatment Gait belt   Activity Tolerance Patient tolerated treatment well   Behavior During Therapy [facility] for tasks assessed/performed      Past Medical History:  Diagnosis Date  . CAD (coronary artery disease)    Minimal plaque cath 2006/09/17  . Cataract    both  . Complication of anesthesia   . Diabetes mellitus   . ED (erectile dysfunction)   . Hypertension   . Osteoarthritis   . PONV (postoperative nausea and vomiting) 1960   with Ether  . Rheumatic fever   . Rhinitis     Past Surgical History:  Procedure Laterality Date  . AMPUTATION Right 02/29/2016   Procedure: Right Leg AMPUTATION BELOW KNEE with Wound Vac placement;  Surgeon: [name], MD;  Location: [facility];  Service: Orthopedics;  Laterality: Right;  . CHOLECYSTECTOMY  1983  . colonoscopy  2006, 2010  . INGUINAL HERNIA REPAIR Right 1960    There were no vitals filed for this visit.      Subjective Assessment - 09/28/16 1536    Subjective Pt reports wearing prosthesis for up to 5 hours today and plans to wear it another 5 hours.  Reporting some pressure on lower limb this afternoon.   Patient is accompained by: Family member   Pertinent History R transtibial amputation, DM2, HTN, B cataracts, diabetic polyneuropathy, CAD, SVT, CHF, basal cell cancer on his face, OA    Limitations Lifting;Standing;Walking;House hold  activities   Patient Stated Goals 18 holes of golf (using cart), walk and get around independently, get back on feet to take care of wife (wheelchair bound with PD)    Currently in Pain? No/denies                         [facility] Adult PT Treatment/Exercise - 09/28/16 1542      Transfers   Transfers Sit to Stand;Stand to Sit   Sit to Stand 6: Modified independent (Device/Increase time);With upper extremity assist;From chair/3-in-1   Stand to Sit 6: Modified independent (Device/Increase time);With upper extremity assist;To chair/3-in-1     Ambulation/Gait   Ambulation/Gait Yes   Ambulation/Gait Assistance 4: Min guard   Ambulation/Gait Assistance Details cues for sequencing gait with cane with quad tip   Ambulation Distance (Feet) 120 Feet   Assistive device Straight cane   Gait Pattern Step-through pattern;Decreased step length - right;Decreased step length - left;Decreased stance time - right;Decreased stride length;Decreased weight shift to right   Ambulation Surface Level;Indoor     High Level Balance   High Level Balance Activities Other (comment)   High Level Balance Comments Performed standing on solid surface and then on compliant surfaces with alternating UE balloon taps with one UE support>>no UE support and then reaching across midline for trunk and pelvis rotation with min A  Prosthetics   Prosthetic Care Comments  Pt has been using baby oil on upper limb for heat rash, has not had to use anti-perspirant on lower limb   Current prosthetic wear tolerance (days/week)  5 hours, 2x/day   Residual limb condition  Small fluid filled blister, not open below incision scar on medial side.  Will monitor for now   Education Provided Skin check;Residual limb care;Proper wear schedule/adjustment   Person(s) Educated Patient;Child(ren)   Education Method Explanation;Demonstration   Education Method Verbalized understanding;Returned Doctor, general practice Prosthesis  Supervision   Doffing Prosthesis Supervision             Balance Exercises - 09/28/16 1607      Balance Exercises: Standing   Retro Gait Foam/compliant surface;Upper extremity support;4 reps  solid surface and compliant, one UE support   Sidestepping Foam/compliant support;Upper extremity support;4 reps  solid surface and compliant, 2 > 1 UE support   Other Standing Exercises Forwards gait on solid surface and then compliant with one UE support on L side x 4 reps with cues for weight shifting, activation of extensors in RLE in stance and full step length with LLE           PT Education - 09/28/16 1628    Education provided Yes   Education Details see prosthetics section   Person(s) Educated Patient;Child(ren)   Methods Explanation;Demonstration   Comprehension Verbalized understanding;Returned demonstration          PT Short Term Goals - 09/12/16 1605      PT SHORT TERM GOAL #1   Title Patient will verbalize understanding of initial HEP. (TARGET DATE: 10/05/2016)   Time 4   Period Weeks   Status New     PT SHORT TERM GOAL #2   Title Patient will report wearing prosthesis > or = 8 hrs/day without signs of skin integrity issues. (TARGET DATE: 10/05/2016)   Time 4   Period Weeks   Status New     PT SHORT TERM GOAL #3   Title Patient will demonsrate ability to ambulate with supervision with RW and prosthesis 300 feet including ramps and curbs.    Time 4   Period Weeks   Status New     PT SHORT TERM GOAL #4   Title Patient will demonstrate ability to safely reach forward 3 inches in standing with prosthesis and supervision to indicate a decrease in his risk of falling. (TARGET DATE: 10/05/2016)   Time 4   Period Weeks   Status New     PT SHORT TERM GOAL #5   Title Patient will independently demonstrate ability to don and doff prosthesis. (TARGET DATE: 10/05/2016)   Time 4   Period Weeks   Status New           PT Long Term Goals - 09/12/16 1559      PT  LONG TERM GOAL #1   Title Patient will verbalize understanding of ongoing HEP and fitness program. (TARGET DATE: 11/09/2016)   Time 8   Period Weeks   Status New     PT LONG TERM GOAL #2   Title Patient will report wearing prosthesis >90% of all waking hours without skin integrity issues to decrease his risk of falling. 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 (TARGET DATE: 11/09/2016)    Time 8   Period Weeks   Status New     PT LONG TERM GOAL #4   Title Patient will achieve a Berg Balance Test score of >45 to indicate a decreased risk of falling. (TARGET DATE: 11/09/2016)    Time 8   Period Weeks   Status New     PT LONG TERM GOAL #5   Title Patient will demonstrate ability to ambulate 500 feet with LRAD and prosthesis and supervision over unlevel surfaces (grass, curbs, and ramps) to indicate being able to return to patient's goal of golfing. (TARGET DATE: 11/09/2016)   Time 8   Period Weeks   Status New     Additional Long Term Goals   Additional Long Term Goals Yes     PT LONG TERM GOAL #6   Title Patient will complete the Functional Gait Assessment, and goal will be set. (TARGET DATE: 11/09/2016)   Time 8   Period Weeks   Status Unable to assess     PT LONG TERM GOAL #7   Title Patient will verbalize understanding and demonstrate proper prosthetic care to demonstrate safety in using prosthesis. (TARGET DATE: 11/09/2016)   Time 8   Period Weeks   Status New               Plan - 09/28/16 1629    Clinical Impression Statement Treatment session today continued to focus on skin care and skin checks, reducing perspiration and correct fit of prosthesis due to pt reporting pressure on lower residual limb and small blister now present below incision line.  Continued balance training focusing on static and dynamic  tasks with one and then no UE support reaching in various directions on solid and then compliant surfaces and varying gait tasks with one UE support on solid and compliant surfaces.  Initiated gait training with cane with quad tip with pt demonstrating improved step through gait sequence and improved efficiency of gait on level, indoor surfaces.  Will continue to progress.   Rehab Potential Good   Clinical Impairments Affecting Rehab Potential DM2, HTN, B cataracts, diabetic polyneuropathy, CAD, SVT, CHF, basal cell cancer on face, OA    PT Frequency 2x / week   PT Duration Other (comment)   PT Treatment/Interventions ADLs/Self Care Home Management;DME Instruction;Gait training;Stair training;Functional mobility training;Therapeutic activities;Therapeutic exercise;Balance training;Prosthetic Training;Orthotic Fit/Training;Patient/family education;Neuromuscular re-education;Manual techniques   PT Next Visit Plan STG due by Friday 5/11!  Work on standing balance with decreasing UE support, progress to cane as able with gait on variety of surfaces   Consulted and Agree with Plan of Care Patient;Family member/caregiver   Family Member Consulted daughter      Patient will benefit from skilled therapeutic intervention in order to improve the following deficits and impairments:  Abnormal gait, Decreased activity tolerance, Decreased balance, Decreased knowledge of precautions, Decreased endurance, Decreased knowledge of use of DME, Decreased mobility, Decreased range of motion, Decreased safety awareness, Difficulty walking, Decreased strength, Impaired sensation, Prosthetic Dependency, Postural dysfunction, Improper body mechanics, Pain  Visit Diagnosis: Unsteadiness on feet  Other abnormalities of gait and mobility  Muscle weakness (generalized)  Contracture of muscle, multiple sites     Problem List Patient Active Problem List   Diagnosis Date Noted  . Diabetic polyneuropathy associated with  type 2 diabetes mellitus ([place]) 05/31/2016  . H/O amputation of leg through tibia and fibula, right ([place]) 05/03/2016  . Phantom limb pain (  [place]) 03/06/2016  . Heat rash 03/06/2016  . Below knee amputation status ([place]) 02/29/2016  . SVT (supraventricular tachycardia) ([place]) 01/20/2016  . Chronic systolic heart failure ([place]) 01/20/2016  . Sepsis ([place]) 12/10/2015  . Diabetic foot ulcers ([place]) 07/12/2015  . Dysphagia 08/06/2014  . Anxiety state 10/26/2013  . PVC (premature ventricular contraction) 07/27/2011  . Diabetes mellitus type II, uncontrolled ([place]) 11/02/2010  . BASAL CELL CARCINOMA, FACE 10/18/2008  . BASAL CELL CARCINOMA, FACE 10/18/2008  . LIPOMA, SKIN 05/31/2008  . DRY SKIN 05/31/2008  . ERECTILE DYSFUNCTION 09/30/2007  . OSTEOARTHRITIS 08/08/2007  . Essential hypertension 03/24/2007    Raylene Everts, PT, DPT 09/28/16    4:34 PM    [facility] [address] [place], [place], [address] Phone: [phone]   Fax:  [phone]  Name: ANNE SEBRING MRN: [id number] Date of Birth: [birth year]

## 2016-10-01 ENCOUNTER — Encounter (INDEPENDENT_AMBULATORY_CARE_PROVIDER_SITE_OTHER): Payer: Self-pay | Admitting: Orthopedic Surgery

## 2016-10-01 ENCOUNTER — Ambulatory Visit (INDEPENDENT_AMBULATORY_CARE_PROVIDER_SITE_OTHER): Payer: Medicare Other | Admitting: Orthopedic Surgery

## 2016-10-01 ENCOUNTER — Encounter: Payer: Self-pay | Admitting: Rehabilitation

## 2016-10-01 VITALS — Ht 72.0 in | Wt 188.0 lb

## 2016-10-01 DIAGNOSIS — Z89511 Acquired absence of right leg below knee: Secondary | ICD-10-CM | POA: Diagnosis not present

## 2016-10-01 MED ORDER — GABAPENTIN 100 MG PO CAPS: 100.0000 mg | ORAL_CAPSULE | Freq: Three times a day (TID) | ORAL | 3 refills | Status: DC

## 2016-10-01 NOTE — Progress Notes (Signed)
Office Visit Note   Patient: [name]           Date of Birth: [birth year]           MRN: [id number] Visit Date: 10/01/2016              Requested by: [name], MD [address] [place], [place] [address] PCP: Dorena Cookey, MD  Chief Complaint  Patient presents with  . Right Leg - Follow-up    02/29/16 right below the knee amputation       HPI: Patient presents status post right transtibial amputation is currently ambulating with a prosthesis and a walker patient is quite pleased with his progress. Patient states he does have neuropathic pain in both lower extremities as well as some neuropathic pain in his upper extremities. He is currently not taking any medication he is currently doing physical therapy and is trying to get hand controls for his car.  Assessment & Plan: Visit Diagnoses:  1. Status post below knee amputation of right lower extremity ([place])     Plan: Patient was sent in a prescription for Neurontin he'll start of 100 mg daily at bedtime and may increase up to 3 times a day as he tolerates. Discussed the increased drowsiness with the medication. Reevaluate in 2 months. May need to adjust the Neurontin accordingly.  Follow-Up Instructions: Return in about 2 months (around 12/01/2016).   Ortho Exam  Patient is alert, oriented, no adenopathy, well-dressed, normal affect, normal respiratory effort. Examination patient has a antalgic gait currently uses a walker. He is in place quite well there is no open ulcers no cellulitis no signs of infection.  Imaging: No results found.  Labs: Lab Results  Component Value Date   HGBA1C 5.9 09/10/2016   HGBA1C 5.8 02/06/2016   HGBA1C 5.6 12/10/2015   ESRSEDRATE 25 (H) 12/10/2015   ESRSEDRATE 31 (H) 11/10/2013   CRP 0.8 12/10/2015   CRP 0.6 (H) 11/10/2013   REPTSTATUS 12/15/2015 FINAL 12/10/2015   CULT  12/10/2015    NO GROWTH 5 DAYS Performed at [facility] (A)  12/10/2015    Orders:  No orders of the defined types were placed in this encounter.  Meds ordered this encounter  Medications  . gabapentin (NEURONTIN) 100 MG capsule    Sig: Take 1 capsule (100 mg total) by mouth 3 (three) times daily. When necessary for neuropathy pain    Dispense:  90 capsule    Refill:  3     Procedures: No procedures performed  Clinical Data: No additional findings.  ROS:  All other systems negative, except as noted in the HPI. Review of Systems  Objective: Vital Signs: Ht 6' (1.829 m)   Wt 188 lb (85.3 kg)   BMI 25.50 kg/m   Specialty Comments:  No specialty comments available.  PMFS History: Patient Active Problem List   Diagnosis Date Noted  . Diabetic polyneuropathy associated with type 2 diabetes mellitus ([place]) 05/31/2016  . H/O amputation of leg through tibia and fibula, right ([place]) 05/03/2016  . Phantom limb pain ([place]) 03/06/2016  . Heat rash 03/06/2016  . Below knee amputation status ([place]) 02/29/2016  . SVT (supraventricular tachycardia) ([place]) 01/20/2016  . Chronic systolic heart failure ([place]) 01/20/2016  . Sepsis ([place]) 12/10/2015  . Diabetic foot ulcers ([place]) 07/12/2015  . Dysphagia 08/06/2014  . Anxiety state 10/26/2013  . PVC (premature ventricular contraction) 07/27/2011  . Diabetes mellitus type  II, uncontrolled ([place]) 11/02/2010  . BASAL CELL CARCINOMA, FACE 10/18/2008  . BASAL CELL CARCINOMA, FACE 10/18/2008  . LIPOMA, SKIN 05/31/2008  . DRY SKIN 05/31/2008  . ERECTILE DYSFUNCTION 09/30/2007  . OSTEOARTHRITIS 08/08/2007  . Essential hypertension 03/24/2007   Past Medical History:  Diagnosis Date  . CAD (coronary artery disease)    Minimal plaque cath 2008  . Cataract    both  . Complication of anesthesia   . Diabetes mellitus   . ED (erectile dysfunction)   . Hypertension   . Osteoarthritis   . PONV (postoperative nausea and vomiting) 1960   with Ether  . Rheumatic fever   . Rhinitis     Family History    Problem Relation Age of Onset  . Stroke Father 59    Died age 5  . Coronary artery disease Brother 40  . Colon cancer Neg Hx   . Colon polyps Neg Hx   . Diabetes Neg Hx   . Esophageal cancer Neg Hx   . Kidney disease Neg Hx   . Gallbladder disease Neg Hx     Past Surgical History:  Procedure Laterality Date  . AMPUTATION Right 02/29/2016   Procedure: Right Leg AMPUTATION BELOW KNEE with Wound Vac placement;  Surgeon: [name], MD;  Location: [facility];  Service: Orthopedics;  Laterality: Right;  . CHOLECYSTECTOMY  1983  . colonoscopy  2006, 2010  . INGUINAL HERNIA REPAIR Right 1960   Social History   Occupational History  . 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 Smoking status: Former Smoker    Quit date: 07/26/1981  . Smokeless tobacco: Never Used  . Alcohol use 8.4 oz/week    14 Shots of liquor per week  . Drug use: No  . Sexual activity: No

## 2016-10-02 ENCOUNTER — Encounter: Payer: Self-pay | Admitting: Physical Therapy

## 2016-10-02 ENCOUNTER — Ambulatory Visit: Payer: Medicare Other | Admitting: Physical Therapy

## 2016-10-02 DIAGNOSIS — M6281 Muscle weakness (generalized): Secondary | ICD-10-CM

## 2016-10-02 DIAGNOSIS — R2689 Other abnormalities of gait and mobility: Secondary | ICD-10-CM | POA: Diagnosis not present

## 2016-10-02 DIAGNOSIS — M6249 Contracture of muscle, multiple sites: Secondary | ICD-10-CM

## 2016-10-02 DIAGNOSIS — R2681 Unsteadiness on feet: Secondary | ICD-10-CM | POA: Diagnosis not present

## 2016-10-02 NOTE — Therapy (Signed)
[facility] [address] [place], [place], [address] Phone: [phone]   Fax:  [phone]  Physical Therapy Treatment  Patient Details  Name: [name] MRN: [id number] Date of Birth: 1940/09/25 Referring Provider: Meridee Score MD   Encounter Date: 10/02/2016      PT End of Session - 10/02/16 1336    Visit Number 7   Number of Visits 18   Date for PT Re-Evaluation 11/09/16   Authorization Type Medicare and G-codes    PT Start Time 6962   PT Stop Time 1400   PT Time Calculation (min) 43 min   Equipment Utilized During Treatment Gait belt   Activity Tolerance Patient tolerated treatment well   Behavior During Therapy [facility] for tasks assessed/performed      Past Medical History:  Diagnosis Date  . CAD (coronary artery disease)    Minimal plaque cath 2008  . Cataract    both  . Complication of anesthesia   . Diabetes mellitus   . ED (erectile dysfunction)   . Hypertension   . Osteoarthritis   . PONV (postoperative nausea and vomiting) 1960   with Ether  . Rheumatic fever   . Rhinitis     Past Surgical History:  Procedure Laterality Date  . AMPUTATION Right 02/29/2016   Procedure: Right Leg AMPUTATION BELOW KNEE with Wound Vac placement;  Surgeon: [name], MD;  Location: [facility];  Service: Orthopedics;  Laterality: Right;  . CHOLECYSTECTOMY  1983  . colonoscopy  2006, 2010  . INGUINAL HERNIA REPAIR Right 1960    There were no vitals filed for this visit.      Subjective Assessment - 10/02/16 1335    Subjective No new complaints. No falls. Has script for Gabapentin from Dr. Sharol Given, getting it filled today.    Patient is accompained by: Family member   Pertinent History R transtibial amputation, DM2, HTN, B cataracts, diabetic polyneuropathy, CAD, SVT, CHF, basal cell cancer on his face, OA    Patient Stated Goals 18 holes of golf (using cart), walk and get around independently, get back on feet to  take care of wife (wheelchair bound with PD)    Currently in Pain? No/denies   Pain Score 0-No pain           [facility] Adult PT Treatment/Exercise - 10/02/16 1341      Transfers   Transfers Sit to Stand;Stand to Sit   Sit to Stand 6: Modified independent (Device/Increase time);With upper extremity assist;From bed   Stand to Sit 6: Modified independent (Device/Increase time);With upper extremity assist;To bed     Ambulation/Gait   Ambulation/Gait Yes   Ambulation/Gait Assistance 5: Supervision   Ambulation/Gait Assistance Details occasional cues to look up with gait   Ambulation Distance (Feet) 300 Feet   Assistive device Rolling walker   Gait Pattern Step-through pattern;Decreased step length - right;Decreased step length - left;Decreased stance time - right;Decreased stride length;Decreased weight shift to right   Ambulation Surface Level;Indoor   Stairs Yes   Stairs Assistance 4: Min guard   Stairs Assistance Details (indicate cue type and reason) cues on sequencing, hand advancement on rails, and weight shifting with stair negotiation   Stair Management Technique Two rails;Step to pattern;Forwards   Number of Stairs 4   Height of Stairs 6   Ramp 5: Supervision   Ramp Details (indicate cue type and reason) with RW/prosthesis. no cues or assistance needed   Curb 5: Supervision   Curb  Details (indicate cue type and reason) with prosthesis/RW, no cues or assistance needed     Self-Care   Other Self-Care Comments  pt arrived with a cane that was given to him: hurrycane in carry bag. Brief discussion on safety issues associated with this cane due to it collapses easliy if base gets caught on anything. Daugher mentioned having a small based quad cane. Discussed the tendency of this cane to be used like a single point and the fall risk associated with this. Both pt and daughter verbalized understanding. Pt has a wooden cane he used prior to amputaiton that PTA requested he bring in next  session. Pt stated "you won't like it". PTA requested he bring it any way and daughter agreed. 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 Trialed Scifit in session today to determine pt's activity tolerance as he used to ride seated scifit for 20 minutes. Decreased time by half this session with pt stating this is a good starting point. Daughter to try and get him to [facility] at least 1 time a week until he can get scat set up. Pt then plans to go up ot 3 x week as able in addition to PT.                       Exercises   Other Exercises  Scifit with UE/LE's level 3.0 x 10 minutes with goal rpm>/= 50 for strengthening and activity tolerance.     Prosthetics   Current prosthetic wear tolerance (days/week)  daily   Current prosthetic wear tolerance (#hours/day)  4-5 hours 2x day most days, if has a busy day with appts only gets 1 wear a day in .   Residual limb condition  intact except for ? small blister at distal end of limb. closed and not fluid filled today. heat rash has greatly improved.    Education Provided Residual limb care;Proper wear schedule/adjustment;Proper weight-bearing schedule/adjustment;Correct ply sock adjustment   Person(s) Educated Patient;Child(ren)   Education Method Explanation;Demonstration;Verbal cues   Education Method Verbalized understanding;Verbal cues required;Needs further instruction   Donning Prosthesis Supervision   Doffing Prosthesis Supervision             PT Short Term Goals - 10/02/16 1336      PT SHORT TERM GOAL #1   Title Patient will verbalize understanding of initial HEP. (TARGET DATE: 10/05/2016)   Baseline 10/02/16: met with current HEP (sink program for balance and proprioception   Status Achieved     PT SHORT TERM GOAL #2   Title Patient will report wearing prosthesis > or = 8 hrs/day without signs of skin integrity issues. (TARGET DATE: 10/05/2016)   Baseline 10/02/16: met- pt wearing 4-5 hours 2 x day most days, has occasional  days of only 1 wear time   Time --   Period --   Status Achieved     PT SHORT TERM GOAL #3   Title Patient will demonsrate ability to ambulate with supervision with RW and prosthesis 300 feet including ramps and curbs.    Baseline 10/02/16: met today   Time --   Period --   Status Achieved     PT SHORT TERM GOAL #4   Title Patient will demonstrate ability to safely reach forward 3 inches in standing with prosthesis and supervision to indicate a decrease in his risk of falling. (TARGET DATE: 10/05/2016)   Time 4   Period Weeks   Status On-going  PT SHORT TERM GOAL #5   Title Patient will independently demonstrate ability to don and doff prosthesis. (TARGET DATE: 10/05/2016)   Baseline 10/02/16: met today   Status Achieved           PT Long Term Goals - 09/12/16 1559      PT LONG TERM GOAL #1   Title Patient will verbalize understanding of ongoing HEP and fitness program. (TARGET DATE: 11/09/2016)   Time 8   Period Weeks   Status New     PT LONG TERM GOAL #2   Title Patient will report wearing prosthesis >90% of all waking hours without skin integrity issues to decrease his risk of falling. (TARGET DAT: 11/09/2016)    Time 8   Period Weeks   Status New     PT LONG TERM GOAL #3   Title Patient will have a gait speed of > or = 1.59f/s to indicate he is safe as a limited community ambulator. (TARGET DATE: 11/09/2016)    Time 8   Period Weeks   Status New     PT LONG TERM GOAL #4   Title Patient will achieve a Berg Balance Test score of >45 to indicate a decreased risk of falling. (TARGET DATE: 11/09/2016)    Time 8   Period Weeks   Status New     PT LONG TERM GOAL #5   Title Patient will demonstrate ability to ambulate 500 feet with LRAD and prosthesis and supervision over unlevel surfaces (grass, curbs, and ramps) to indicate being able to return to patient's goal of golfing. (TARGET DATE: 11/09/2016)   Time 8   Period Weeks   Status New     Additional Long Term Goals    Additional Long Term Goals Yes     PT LONG TERM GOAL #6   Title Patient will complete the Functional Gait Assessment, and goal will be set. (TARGET DATE: 11/09/2016)   Time 8   Period Weeks   Status Unable to assess     PT LONG TERM GOAL #7   Title Patient will verbalize understanding and demonstrate proper prosthetic care to demonstrate safety in using prosthesis. (TARGET DATE: 11/09/2016)   Time 8   Period Weeks   Status New               Plan - 10/02/16 1336    Clinical Impression Statement Today's session began to look at progress toward STGs. Pt has met 4/5 STGs to date. Will check remaining goal next session. Also began to look at return to community fitness and resuming [employer] for continued strengthening and activity tolerance. Pt is progressing toward goals and should benefit from continued PT to progress toward unmet goals.                      Rehab Potential Good   Clinical Impairments Affecting Rehab Potential DM2, HTN, B cataracts, diabetic polyneuropathy, CAD, SVT, CHF, basal cell cancer on face, OA    PT Frequency 2x / week   PT Duration Other (comment)   PT Treatment/Interventions ADLs/Self Care Home Management;DME Instruction;Gait training;Stair training;Functional mobility training;Therapeutic activities;Therapeutic exercise;Balance training;Prosthetic Training;Orthotic Fit/Training;Patient/family education;Neuromuscular re-education;Manual techniques   PT Next Visit Plan  check remaining STG; Work on standing balance with decreasing UE support, progress to cane as able with gait on variety of surfaces   Consulted and Agree with Plan of Care Patient;Family member/caregiver   Family Member Consulted daughter      Patient will  benefit from skilled therapeutic intervention in order to improve the following deficits and impairments:  Abnormal gait, Decreased activity tolerance, Decreased balance, Decreased knowledge of precautions, Decreased endurance, Decreased  knowledge of use of DME, Decreased mobility, Decreased range of motion, Decreased safety awareness, Difficulty walking, Decreased strength, Impaired sensation, Prosthetic Dependency, Postural dysfunction, Improper body mechanics, Pain  Visit Diagnosis: Unsteadiness on feet  Other abnormalities of gait and mobility  Muscle weakness (generalized)  Contracture of muscle, multiple sites     Problem List Patient Active Problem List   Diagnosis Date Noted  . Diabetic polyneuropathy associated with type 2 diabetes mellitus ([place]) 05/31/2016  . H/O amputation of leg through tibia and fibula, right ([place]) 05/03/2016  . Phantom limb pain ([place]) 03/06/2016  . Heat rash 03/06/2016  . Below knee amputation status ([place]) 02/29/2016  . SVT (supraventricular tachycardia) ([place]) 01/20/2016  . Chronic systolic heart failure ([place]) 01/20/2016  . Sepsis ([place]) 12/10/2015  . Diabetic foot ulcers ([place]) 07/12/2015  . Dysphagia 08/06/2014  . Anxiety state 10/26/2013  . PVC (premature ventricular contraction) 07/27/2011  . Diabetes mellitus type II, uncontrolled ([place]) 11/02/2010  . BASAL CELL CARCINOMA, FACE 10/18/2008  . BASAL CELL CARCINOMA, FACE 10/18/2008  . LIPOMA, SKIN 05/31/2008  . DRY SKIN 05/31/2008  . ERECTILE DYSFUNCTION 09/30/2007  . OSTEOARTHRITIS 08/08/2007  . Essential hypertension 03/24/2007    Willow Ora, PTA, [facility] [address] [place], [place] [address] [phone] 10/02/16, 10:28 PM   Name: [name] MRN: [id number] Date of Birth: Jun 18, 1940

## 2016-10-05 ENCOUNTER — Ambulatory Visit: Payer: Medicare Other | Admitting: Physical Therapy

## 2016-10-05 ENCOUNTER — Encounter: Payer: Self-pay | Admitting: Physical Therapy

## 2016-10-05 DIAGNOSIS — M6249 Contracture of muscle, multiple sites: Secondary | ICD-10-CM

## 2016-10-05 DIAGNOSIS — R2681 Unsteadiness on feet: Secondary | ICD-10-CM

## 2016-10-05 DIAGNOSIS — R2689 Other abnormalities of gait and mobility: Secondary | ICD-10-CM

## 2016-10-05 DIAGNOSIS — M6281 Muscle weakness (generalized): Secondary | ICD-10-CM

## 2016-10-05 NOTE — Therapy (Signed)
[facility] [address] [place], [place], [address] Phone: [phone]   Fax:  [phone]  Physical Therapy Treatment  Patient Details  Name: [name] MRN: [id number] Date of Birth: 15-Nov-1940 Referring Provider: Meridee Score MD   Encounter Date: 10/05/2016      PT End of Session - 10/05/16 1617    Visit Number 8   Number of Visits 18   Date for PT Re-Evaluation 11/09/16   Authorization Type Medicare and G-codes    PT Start Time 2119   PT Stop Time 1615   PT Time Calculation (min) 45 min   Equipment Utilized During Treatment Gait belt   Activity Tolerance Patient tolerated treatment well   Behavior During Therapy [facility] for tasks assessed/performed      Past Medical History:  Diagnosis Date  . CAD (coronary artery disease)    Minimal plaque cath 2008  . Cataract    both  . Complication of anesthesia   . Diabetes mellitus   . ED (erectile dysfunction)   . Hypertension   . Osteoarthritis   . PONV (postoperative nausea and vomiting) 1960   with Ether  . Rheumatic fever   . Rhinitis     Past Surgical History:  Procedure Laterality Date  . AMPUTATION Right 02/29/2016   Procedure: Right Leg AMPUTATION BELOW KNEE with Wound Vac placement;  Surgeon: [name], MD;  Location: [facility];  Service: Orthopedics;  Laterality: Right;  . CHOLECYSTECTOMY  1983  . colonoscopy  2006, 2010  . INGUINAL HERNIA REPAIR Right 1960    There were no vitals filed for this visit.      Subjective Assessment - 10/05/16 1541    Subjective Patient denies any new complaints, changes, or falls since last visit. Patient reports he has not been [facility], yet, but still plans to go.    Patient is accompained by: Family member   Pertinent History R transtibial amputation, DM2, HTN, B cataracts, diabetic polyneuropathy, CAD, SVT, CHF, basal cell cancer on his face, OA    Patient Stated Goals 18 holes of golf (using cart), walk  and get around independently, get back on feet to take care of wife (wheelchair bound with PD)    Currently in Pain? Yes   Pain Score 2    Pain Location Back   Pain Orientation Mid;Lower   Pain Descriptors / Indicators Aching   Aggravating Factors  leaning forward typing on computer    Pain Relieving Factors walking/moving             [facility] Adult PT Treatment/Exercise - 10/05/16 1530      Transfers   Transfers Sit to Stand;Stand to Sit   Sit to Stand 6: Modified independent (Device/Increase time);With upper extremity assist;From bed   Stand to Sit 6: Modified independent (Device/Increase time);With upper extremity assist;To bed     Ambulation/Gait   Ambulation/Gait Yes   Ambulation/Gait Assistance 5: Supervision;4: Min guard;4: Min assist;3: Mod assist;2: Max assist   Ambulation/Gait Assistance Details Patient requires supervision with RW; min A when beginning ambulation with cane, then decreases assistance level to min guard. Patient requires min A when ambulating with cane over compliant surfaces. Patient requires cueing for technique and sequencing with prosthesis for proper foot clearance. Patient ambulated over obstacles with cane and min A to maintain safety. Patient required mod A - max A when negotiating one obstacle due to LOB due to improper sequencing of cane/prosthesis. Patient requires demonstration and  cueing for technique and sequencing with cane and prosthesis.    Ambulation Distance (Feet) 230 Feet   Assistive device Rolling walker   Gait Pattern Step-through pattern;Decreased step length - right;Decreased step length - left;Decreased stance time - right;Decreased stride length;Decreased weight shift to right   Ambulation Surface Level;Indoor  compliant surface (mat)    Stairs Yes   Stairs Assistance 4: Min assist   Stairs Assistance Details (indicate cue type and reason) requires demonstration and cueing on technique, weight shift, and sequencing of prosthesis and  cane    Stair Management Technique One rail Right;One rail Left;Step to pattern;Forwards;With cane   Number of Stairs 4  x2   Height of Stairs 6   Ramp 3: Mod assist  with cane    Ramp Details (indicate cue type and reason) Requires demonstration and cueing for sequencing, foot placement, and weight shift    Curb 3: Mod assist  with cane    Curb Details (indicate cue type and reason) Requires demonstration and cueing for technique and sequencing with prosthesis     Prosthetics   Prosthetic Care Comments  Patient reports he is wearing prosthesis 7hrs/day with a break midday for sweat management.   Current prosthetic wear tolerance (days/week)  daily   Current prosthetic wear tolerance (#hours/day)  approximately 7-8 hrs/day    Residual limb condition  Patient denies any skin integrity issues, and reports heat rash is improving.   Education Provided Residual limb care;Proper wear schedule/adjustment;Proper weight-bearing schedule/adjustment   Person(s) Educated Patient;Child(ren)   Education Method Explanation;Demonstration   Education Method Verbalized understanding;Tactile cues required;Verbal cues required            PT Education - 10/05/16 1542    Education provided Yes   Education Details adjusting set up at computer to decresae stress in back to decrease back pain    Person(s) Educated Patient;Child(ren)   Methods Explanation   Comprehension Verbalized understanding          PT Short Term Goals - 10/05/16 1600      PT SHORT TERM GOAL #1   Title Patient will verbalize understanding of initial HEP. (TARGET DATE: 10/05/2016)   Baseline 10/02/16: met with current HEP (sink program for balance and proprioception   Status Achieved     PT SHORT TERM GOAL #2   Title Patient will report wearing prosthesis > or = 8 hrs/day without signs of skin integrity issues. (TARGET DATE: 10/05/2016)   Baseline 10/02/16: met- pt wearing 4-5 hours 2 x day most days, has occasional days of only  1 wear time   Status Achieved     PT SHORT TERM GOAL #3   Title Patient will demonsrate ability to ambulate with supervision with RW and prosthesis 300 feet including ramps and curbs.    Baseline 10/02/16: met today   Status Achieved     PT SHORT TERM GOAL #4   Title Patient will demonstrate ability to safely reach forward 3 inches in standing with prosthesis and supervision to indicate a decrease in his risk of falling. (TARGET DATE: 10/05/2016)   Baseline MET 10/05/2016   Time 4   Period Weeks   Status Achieved     PT SHORT TERM GOAL #5   Title Patient will independently demonstrate ability to don and doff prosthesis. (TARGET DATE: 10/05/2016)   Baseline 10/02/16: met today   Status Achieved           PT Long Term Goals - 09/12/16 1559  PT LONG TERM GOAL #1   Title Patient will verbalize understanding of ongoing HEP and fitness program. (TARGET DATE: 11/09/2016)   Time 8   Period Weeks   Status New     PT LONG TERM GOAL #2   Title Patient will report wearing prosthesis >90% of all waking hours without skin integrity issues to decrease his risk of falling. (TARGET DAT: 11/09/2016)    Time 8   Period Weeks   Status New     PT LONG TERM GOAL #3   Title Patient will have a gait speed of > or = 1.40f/s to indicate he is safe as a limited community ambulator. (TARGET DATE: 11/09/2016)    Time 8   Period Weeks   Status New     PT LONG TERM GOAL #4   Title Patient will achieve a Berg Balance Test score of >45 to indicate a decreased risk of falling. (TARGET DATE: 11/09/2016)    Time 8   Period Weeks   Status New     PT LONG TERM GOAL #5   Title Patient will demonstrate ability to ambulate 500 feet with LRAD and prosthesis and supervision over unlevel surfaces (grass, curbs, and ramps) to indicate being able to return to patient's goal of golfing. (TARGET DATE: 11/09/2016)   Time 8   Period Weeks   Status New     Additional Long Term Goals   Additional Long Term Goals Yes      PT LONG TERM GOAL #6   Title Patient will complete the Functional Gait Assessment, and goal will be set. (TARGET DATE: 11/09/2016)   Time 8   Period Weeks   Status Unable to assess     PT LONG TERM GOAL #7   Title Patient will verbalize understanding and demonstrate proper prosthetic care to demonstrate safety in using prosthesis. (TARGET DATE: 11/09/2016)   Time 8   Period Weeks   Status New            Plan - 10/05/16 1618    Clinical Impression Statement Today's skilled PT session focused on assessing remaining short term goal. Patient has now met all short term goals. PTA advanced patient's prosthetic gait training by introducing ambulation with the cane over compliant surfaces, ramp, curb, stairs, and introduced obstacle negotiation. Patient requires min guard to min A on compliant surfaces, mod A with cane on ramp/curb. PTA introduced obstacle negotiation with cane, and patient requires min A with intermittent mod A - max A due to improper sequencing and LOB. Patient requires intermittent seated rest breaks due to fatigue. Patient is making great progress towards goals, and will benefit from continued skilled PT to address functional mobility deficits.     Rehab Potential Good   Clinical Impairments Affecting Rehab Potential DM2, HTN, B cataracts, diabetic polyneuropathy, CAD, SVT, CHF, basal cell cancer on face, OA    PT Frequency 2x / week   PT Duration Other (comment)   PT Treatment/Interventions ADLs/Self Care Home Management;DME Instruction;Gait training;Stair training;Functional mobility training;Therapeutic activities;Therapeutic exercise;Balance training;Prosthetic Training;Orthotic Fit/Training;Patient/family education;Neuromuscular re-education;Manual techniques   PT Next Visit Plan standing balance, advance prosthetic gait with cane including obstacle negotiation and a variety of surfaces    Consulted and Agree with Plan of Care Patient;Family member/caregiver   Family  Member Consulted daughter      Patient will benefit from skilled therapeutic intervention in order to improve the following deficits and impairments:  Abnormal gait, Decreased activity tolerance, Decreased balance, Decreased knowledge of precautions,  Decreased endurance, Decreased knowledge of use of DME, Decreased mobility, Decreased range of motion, Decreased safety awareness, Difficulty walking, Decreased strength, Impaired sensation, Prosthetic Dependency, Postural dysfunction, Improper body mechanics, Pain  Visit Diagnosis: Unsteadiness on feet  Other abnormalities of gait and mobility  Muscle weakness (generalized)  Contracture of muscle, multiple sites     Problem List Patient Active Problem List   Diagnosis Date Noted  . Diabetic polyneuropathy associated with type 2 diabetes mellitus ([place]) 05/31/2016  . H/O amputation of leg through tibia and fibula, right ([place]) 05/03/2016  . Phantom limb pain ([place]) 03/06/2016  . Heat rash 03/06/2016  . Below knee amputation status ([place]) 02/29/2016  . SVT (supraventricular tachycardia) ([place]) 01/20/2016  . Chronic systolic heart failure ([place]) 01/20/2016  . Sepsis ([place]) 12/10/2015  . Diabetic foot ulcers ([place]) 07/12/2015  . Dysphagia 08/06/2014  . Anxiety state 10/26/2013  . PVC (premature ventricular contraction) 07/27/2011  . Diabetes mellitus type II, uncontrolled ([place]) 11/02/2010  . BASAL CELL CARCINOMA, FACE 10/18/2008  . BASAL CELL CARCINOMA, FACE 10/18/2008  . LIPOMA, SKIN 05/31/2008  . DRY SKIN 05/31/2008  . ERECTILE DYSFUNCTION 09/30/2007  . OSTEOARTHRITIS 08/08/2007  . Essential hypertension 03/24/2007   Note was scribed by SPT while PTA performed skilled treatment. Agree with note as written.    Willow Ora, PTA, [facility] [address], [place], [place] [address] [phone] 10/05/16, 8:59 PM   Name: JHAMARI MARKOWICZ MRN: [id number] Date of Birth: 10-17-1940

## 2016-10-09 ENCOUNTER — Encounter: Payer: Self-pay | Admitting: Physical Therapy

## 2016-10-09 ENCOUNTER — Ambulatory Visit: Payer: Medicare Other | Admitting: Physical Therapy

## 2016-10-09 DIAGNOSIS — R2689 Other abnormalities of gait and mobility: Secondary | ICD-10-CM

## 2016-10-09 DIAGNOSIS — R2681 Unsteadiness on feet: Secondary | ICD-10-CM | POA: Diagnosis not present

## 2016-10-09 DIAGNOSIS — M6249 Contracture of muscle, multiple sites: Secondary | ICD-10-CM | POA: Diagnosis not present

## 2016-10-09 DIAGNOSIS — M6281 Muscle weakness (generalized): Secondary | ICD-10-CM | POA: Diagnosis not present

## 2016-10-10 ENCOUNTER — Encounter: Payer: Self-pay | Admitting: Physical Therapy

## 2016-10-10 ENCOUNTER — Ambulatory Visit: Payer: Medicare Other | Admitting: Physical Therapy

## 2016-10-10 DIAGNOSIS — M6249 Contracture of muscle, multiple sites: Secondary | ICD-10-CM | POA: Diagnosis not present

## 2016-10-10 DIAGNOSIS — M6281 Muscle weakness (generalized): Secondary | ICD-10-CM | POA: Diagnosis not present

## 2016-10-10 DIAGNOSIS — R2681 Unsteadiness on feet: Secondary | ICD-10-CM

## 2016-10-10 DIAGNOSIS — R2689 Other abnormalities of gait and mobility: Secondary | ICD-10-CM

## 2016-10-10 NOTE — Therapy (Signed)
[facility] [address] [place], [place], [address] Phone: [phone]   Fax:  [phone]  Physical Therapy Treatment  Patient Details  Name: [name] MRN: [id number] Date of Birth: 10-Dec-1940 Referring Provider: Meridee Score MD   Encounter Date: 10/09/2016      PT End of Session - 10/09/16 2000    Visit Number 9   Number of Visits 18   Date for PT Re-Evaluation 11/09/16   Authorization Type Medicare and G-codes    PT Start Time Jul 21, 1528   PT Stop Time July 21, 1626   PT Time Calculation (min) 58 min   Equipment Utilized During Treatment Gait belt   Activity Tolerance Patient tolerated treatment well   Behavior During Therapy [facility] for tasks assessed/performed      Past Medical History:  Diagnosis Date  . CAD (coronary artery disease)    Minimal plaque cath 2006-07-21  . Cataract    both  . Complication of anesthesia   . Diabetes mellitus   . ED (erectile dysfunction)   . Hypertension   . Osteoarthritis   . PONV (postoperative nausea and vomiting) 1960   with Ether  . Rheumatic fever   . Rhinitis     Past Surgical History:  Procedure Laterality Date  . AMPUTATION Right 02/29/2016   Procedure: Right Leg AMPUTATION BELOW KNEE with Wound Vac placement;  Surgeon: [name], MD;  Location: [place];  Service: Orthopedics;  Laterality: Right;  . CHOLECYSTECTOMY  1983  . colonoscopy  2006, 2010  . INGUINAL HERNIA REPAIR Right 1960    There were no vitals filed for this visit.      Subjective Assessment - 10/09/16 1532    Subjective He is wearing prosthesis daily but only once for 6-8 hrs.    Patient is accompained by: Family member   Pertinent History R transtibial amputation, DM2, HTN, B cataracts, diabetic polyneuropathy, CAD, SVT, CHF, basal cell cancer on his face, OA    Limitations Lifting;Standing;Walking;House hold activities   Patient Stated Goals 18 holes of golf (using cart), walk and get around  independently, get back on feet to take care of wife (wheelchair bound with PD)    Currently in Pain? No/denies                         [facility] Adult PT Treatment/Exercise - 10/09/16 1530      Transfers   Transfers Sit to Stand;Stand to Sit   Sit to Stand 6: Modified independent (Device/Increase time);With upper extremity assist;From bed   Stand to Sit 6: Modified independent (Device/Increase time);With upper extremity assist;To bed     Ambulation/Gait   Ambulation/Gait Yes   Ambulation/Gait Assistance 5: Supervision   Ambulation/Gait Assistance Details trialed straight canes with std tip & quad tip and different weights. Pt liked std tip & slightly heavier cane. PT instructed where to find this style for purchase. No balance losses with cane on level, firm surfaces.    Ambulation Distance (Feet) 500 Feet  500' & 300'   Assistive device Straight cane;Prosthesis  arrived using RW independently with prosthesis   Gait Pattern Step-through pattern;Decreased step length - right;Decreased step length - left;Decreased stance time - right;Decreased stride length;Decreased weight shift to right   Ambulation Surface Indoor;Level   Stairs Yes   Stairs Assistance 5: Supervision   Stairs Assistance Details (indicate cue type and reason) verbal cues on cane use / placement   Stair Management Technique  One rail Right;One rail Left;With cane;Step to pattern;Forwards   Number of Stairs 4  x2   Height of Stairs 6   Ramp 4: Min assist  with cane & prosthesis   Ramp Details (indicate cue type and reason) verbal & tactile cues on posture, wt shift & balance reaction   Curb 4: Min assist  with cane & prosthesis   Curb Details (indicate cue type and reason) verbal & tactile cues on sequence, step length & balance reactions.      High Level Balance   High Level Balance Activities Negotiating over obstacles;Negotitating around obstacles   High Level Balance Comments PT instructed in  technique with TTA prosthesis     Self-Care   Self-Care Lifting   Lifting PT demo & instructed in lifting / picking up objects from floor with TTA prosthesis. Pt return demo understanding without UE support with supervision.      Prosthetics   Prosthetic Care Comments  PT instructed in fall risk when prosthesis off with functional activities. Goal to progress wear to all awake hours to maximize function & minimize fall risk. PT recommended donning prosthesis after bathing and mornings performing sponge bath may consider donning after washing lower extremities. PT recommended 2x/day wear to enable longer total wear without skin issues. PT recommended using Secret Clinical Strength anti-perspirant distal to knee.    Current prosthetic wear tolerance (days/week)  daily   Current prosthetic wear tolerance (#hours/day)  approximately 7-8 hrs/day   PT recommended 5-6 hrs 2x/day for longer total wear   Residual limb condition  mild heat rash distal to knee.    Education Provided Residual limb care;Proper wear schedule/adjustment;Proper weight-bearing schedule/adjustment;Correct ply sock adjustment   Person(s) Educated Patient;Child(ren)   Education Method Explanation;Verbal cues   Education Method Verbalized understanding;Verbal cues required;Needs further instruction                PT Education - 10/09/16 1600    Education provided Yes   Education Details Driving (4) options with right below knee amputation, golf initially work on putting, Teaching laboratory technician & weight    Person(s) Educated Patient;Child(ren)   Methods Explanation;Verbal cues;Demonstration   Comprehension Verbalized understanding;Verbal cues required          PT Short Term Goals - 10/05/16 1600      PT SHORT TERM GOAL #1   Title Patient will verbalize understanding of initial HEP. (TARGET DATE: 10/05/2016)   Baseline 10/02/16: met with current HEP (sink program for balance and proprioception   Status Achieved     PT SHORT  TERM GOAL #2   Title Patient will report wearing prosthesis > or = 8 hrs/day without signs of skin integrity issues. (TARGET DATE: 10/05/2016)   Baseline 10/02/16: met- pt wearing 4-5 hours 2 x day most days, has occasional days of only 1 wear time   Status Achieved     PT SHORT TERM GOAL #3   Title Patient will demonsrate ability to ambulate with supervision with RW and prosthesis 300 feet including ramps and curbs.    Baseline 10/02/16: met today   Status Achieved     PT SHORT TERM GOAL #4   Title Patient will demonstrate ability to safely reach forward 3 inches in standing with prosthesis and supervision to indicate a decrease in his risk of falling. (TARGET DATE: 10/05/2016)   Baseline MET 10/05/2016   Time 4   Period Weeks   Status Achieved     PT SHORT TERM GOAL #5  Title Patient will independently demonstrate ability to don and doff prosthesis. (TARGET DATE: 10/05/2016)   Baseline 10/02/16: met today   Status Achieved           PT Long Term Goals - 10/09/16 2001      PT LONG TERM GOAL #1   Title Patient will verbalize understanding of ongoing HEP and fitness program. (TARGET DATE: 11/09/2016)   Time 8   Period Weeks   Status On-going     PT LONG TERM GOAL #2   Title Patient will report wearing prosthesis >90% of all waking hours without skin integrity issues to decrease his risk of falling. (TARGET DAT: 11/09/2016)    Time 8   Period Weeks   Status On-going     PT LONG TERM GOAL #3   Title Patient will have a gait speed of > or = 1.70f/s to indicate he is safe as a limited community ambulator. (TARGET DATE: 11/09/2016)    Time 8   Period Weeks   Status On-going     PT LONG TERM GOAL #4   Title Patient will achieve a Berg Balance Test score of >45 to indicate a decreased risk of falling. (TARGET DATE: 11/09/2016)    Time 8   Period Weeks   Status On-going     PT LONG TERM GOAL #5   Title Patient will demonstrate ability to ambulate 500 feet with LRAD and prosthesis and  supervision over unlevel surfaces (grass, curbs, and ramps) to indicate being able to return to patient's goal of golfing. (TARGET DATE: 11/09/2016)   Time 8   Period Weeks   Status On-going     PT LONG TERM GOAL #6   Title Patient will complete the Functional Gait Assessment, and goal will be set. (TARGET DATE: 11/09/2016)   Time 8   Period Weeks   Status On-going     PT LONG TERM GOAL #7   Title Patient will verbalize understanding and demonstrate proper prosthetic care to demonstrate safety in using prosthesis. (TARGET DATE: 11/09/2016)   Time 8   Period Weeks   Status On-going               Plan - 10/09/16 2016    Clinical Impression Statement Patient verbalized better understanding of driving with right TTA prosthesis. Patient appears safe to use cane indoor level firm surfaces.    Rehab Potential Good   Clinical Impairments Affecting Rehab Potential DM2, HTN, B cataracts, diabetic polyneuropathy, CAD, SVT, CHF, basal cell cancer on face, OA    PT Frequency 2x / week   PT Duration Other (comment)   PT Treatment/Interventions ADLs/Self Care Home Management;DME Instruction;Gait training;Stair training;Functional mobility training;Therapeutic activities;Therapeutic exercise;Balance training;Prosthetic Training;Orthotic Fit/Training;Patient/family education;Neuromuscular re-education;Manual techniques   PT Next Visit Plan Do G-code with prosthetic wear & care, FGA with cane & set LTGs, prosthetic gait with cane outdoors if weather permits. Try golf swing.   Consulted and Agree with Plan of Care Patient;Family member/caregiver   Family Member Consulted daughter      Patient will benefit from skilled therapeutic intervention in order to improve the following deficits and impairments:  Abnormal gait, Decreased activity tolerance, Decreased balance, Decreased knowledge of precautions, Decreased endurance, Decreased knowledge of use of DME, Decreased mobility, Decreased range of  motion, Decreased safety awareness, Difficulty walking, Decreased strength, Impaired sensation, Prosthetic Dependency, Postural dysfunction, Improper body mechanics, Pain  Visit Diagnosis: Unsteadiness on feet  Other abnormalities of gait and mobility  Muscle weakness (generalized)  Contracture  of muscle, multiple sites     Problem List Patient Active Problem List   Diagnosis Date Noted  . Diabetic polyneuropathy associated with type 2 diabetes mellitus ([place]) 05/31/2016  . H/O amputation of leg through tibia and fibula, right ([place]) 05/03/2016  . Phantom limb pain ([place]) 03/06/2016  . Heat rash 03/06/2016  . Below knee amputation status ([place]) 02/29/2016  . SVT (supraventricular tachycardia) ([place]) 01/20/2016  . Chronic systolic heart failure ([place]) 01/20/2016  . Sepsis ([place]) 12/10/2015  . Diabetic foot ulcers ([place]) 07/12/2015  . Dysphagia 08/06/2014  . Anxiety state 10/26/2013  . PVC (premature ventricular contraction) 07/27/2011  . Diabetes mellitus type II, uncontrolled ([place]) 11/02/2010  . BASAL CELL CARCINOMA, FACE 10/18/2008  . BASAL CELL CARCINOMA, FACE 10/18/2008  . LIPOMA, SKIN 05/31/2008  . DRY SKIN 05/31/2008  . ERECTILE DYSFUNCTION 09/30/2007  . OSTEOARTHRITIS 08/08/2007  . Essential hypertension 03/24/2007    Jamey Reas PT, DPT 10/10/2016, 10:35 AM  [facility] [address] [place], [place], [address] Phone: [phone]   Fax:  [phone]  Name: [name] MRN: [id number] Date of Birth: 1940-06-24

## 2016-10-11 NOTE — Therapy (Signed)
[facility] [address] [place], [place], [address] Phone: [phone]   Fax:  [phone]  Physical Therapy Treatment  Patient Details  Name: [name] MRN: [id number] Date of Birth: 10-May-1941 Referring Provider: Meridee Score MD   Encounter Date: 10/10/2016      PT End of Session - 10/10/16 1911    Visit Number 10   Number of Visits 18   Date for PT Re-Evaluation 11/09/16   Authorization Type Medicare and G-codes    PT Start Time Jul 25, 1528   PT Stop Time 1616   PT Time Calculation (min) 46 min   Equipment Utilized During Treatment Gait belt   Activity Tolerance Patient tolerated treatment well   Behavior During Therapy [facility] for tasks assessed/performed      Past Medical History:  Diagnosis Date  . CAD (coronary artery disease)    Minimal plaque cath Jul 25, 2006  . Cataract    both  . Complication of anesthesia   . Diabetes mellitus   . ED (erectile dysfunction)   . Hypertension   . Osteoarthritis   . PONV (postoperative nausea and vomiting) 1960   with Ether  . Rheumatic fever   . Rhinitis     Past Surgical History:  Procedure Laterality Date  . AMPUTATION Right 02/29/2016   Procedure: Right Leg AMPUTATION BELOW KNEE with Wound Vac placement;  Surgeon: [name], MD;  Location: [place] [facility];  Service: Orthopedics;  Laterality: Right;  . CHOLECYSTECTOMY  1983  . colonoscopy  2006, 2010  . INGUINAL HERNIA REPAIR Right 1960    There were no vitals filed for this visit.      Subjective Assessment - 10/10/16 1530    Subjective He wore prosthesis 2x today as PT recommended yesterday.    Patient is accompained by: Family member   Pertinent History R transtibial amputation, DM2, HTN, B cataracts, diabetic polyneuropathy, CAD, SVT, CHF, basal cell cancer on his face, OA    Limitations Lifting;Standing;Walking;House hold activities   Patient Stated Goals 18 holes of golf (using cart), walk and get around  independently, get back on feet to take care of wife (wheelchair bound with PD)    Currently in Pain? No/denies            [facility] PT Assessment - 10/10/16 1530      Functional Gait  Assessment   Gait assessed  Yes   Gait Level Surface Walks 20 ft, slow speed, abnormal gait pattern, evidence for imbalance or deviates 10-15 in outside of the 12 in walkway width. Requires more than 7 sec to ambulate 20 ft.   Change in Gait Speed Makes only minor adjustments to walking speed, or accomplishes a change in speed with significant gait deviations, deviates 10-15 in outside the 12 in walkway width, or changes speed but loses balance but is able to recover and continue walking.   Gait with Horizontal Head Turns Performs head turns with moderate changes in gait velocity, slows down, deviates 10-15 in outside 12 in walkway width but recovers, can continue to walk.   Gait with Vertical Head Turns Performs task with moderate change in gait velocity, slows down, deviates 10-15 in outside 12 in walkway width but recovers, can continue to walk.   Gait and Pivot Turn Turns slowly, requires verbal cueing, or requires several small steps to catch balance following turn and stop   Step Over Obstacle Is able to step over one shoe box (4.5 in total height) but must  slow down and adjust steps to clear box safely. May require verbal cueing.   Gait with Narrow Base of Support Ambulates less than 4 steps heel to toe or cannot perform without assistance.   Gait with Eyes Closed Cannot walk 20 ft without assistance, severe gait deviations or imbalance, deviates greater than 15 in outside 12 in walkway width or will not attempt task.   Ambulating Backwards Walks 20 ft, slow speed, abnormal gait pattern, evidence for imbalance, deviates 10-15 in outside 12 in walkway width.   Steps Two feet to a stair, must use rail.   Total Score 8                     [facility] Adult PT Treatment/Exercise - 10/10/16 1530       Ambulation/Gait   Ambulation/Gait Yes   Ambulation/Gait Assistance 5: Supervision   Ambulation/Gait Assistance Details visual cues initially using line on floor, added proprioception with visual, then progressed to only proprioception for step width: left not adducted with external rotation & right / prosthesis not abducted.    Ambulation Distance (Feet) 500 Feet   Assistive device Straight cane;Prosthesis   Ambulation Surface Indoor;Level   Stairs Yes   Stairs Assistance 4: Min guard;5: Supervision   Stairs Assistance Details (indicate cue type and reason) verbal, tactile & demo reciprocal pattern with prosthesis control   Stair Management Technique Two rails;Alternating pattern;Forwards   Number of Stairs 4  3 reps   Ramp 4: Min assist  prosthesis & cane   Ramp Details (indicate cue type and reason) PT demo, instructed prior & verbal, tactile cues on posture & proper weight  shift   Curb 4: Min assist  cane & prosthesis   Curb Details (indicate cue type and reason) demo, verbal cues prior & verbal, tactile cues during on proper sequence, step position / length & balance.      Prosthetics   Prosthetic Care Comments  PT reviewed proper donning of liner with direct skin contact & adjusting ply socks. PT reviewed sweat management in liner.    Current prosthetic wear tolerance (days/week)  daily   Current prosthetic wear tolerance (#hours/day)  5 hrs 2x today   Residual limb condition  mild heat rash distal to knee.    Education Provided Proper Donning;Correct ply sock adjustment;Proper wear schedule/adjustment;Residual limb care   Person(s) Educated Patient;Child(ren)   Education Method Explanation;Verbal cues   Education Method Verbalized understanding;Verbal cues required;Needs further instruction                  PT Short Term Goals - 10/05/16 1600      PT SHORT TERM GOAL #1   Title Patient will verbalize understanding of initial HEP. (TARGET DATE: 10/05/2016)   Baseline  10/02/16: met with current HEP (sink program for balance and proprioception   Status Achieved     PT SHORT TERM GOAL #2   Title Patient will report wearing prosthesis > or = 8 hrs/day without signs of skin integrity issues. (TARGET DATE: 10/05/2016)   Baseline 10/02/16: met- pt wearing 4-5 hours 2 x day most days, has occasional days of only 1 wear time   Status Achieved     PT SHORT TERM GOAL #3   Title Patient will demonsrate ability to ambulate with supervision with RW and prosthesis 300 feet including ramps and curbs.    Baseline 10/02/16: met today   Status Achieved     PT SHORT TERM GOAL #4   Title  Patient will demonstrate ability to safely reach forward 3 inches in standing with prosthesis and supervision to indicate a decrease in his risk of falling. (TARGET DATE: 10/05/2016)   Baseline MET 10/05/2016   Time 4   Period Weeks   Status Achieved     PT SHORT TERM GOAL #5   Title Patient will independently demonstrate ability to don and doff prosthesis. (TARGET DATE: 10/05/2016)   Baseline 10/02/16: met today   Status Achieved           PT Long Term Goals - 10/10/16 1914      PT LONG TERM GOAL #1   Title Patient will verbalize understanding of ongoing HEP and fitness program. (TARGET DATE: 11/09/2016)   Time 8   Period Weeks   Status On-going     PT LONG TERM GOAL #2   Title Patient will report wearing prosthesis >90% of all waking hours without skin integrity issues to decrease his risk of falling. (TARGET DAT: 11/09/2016)    Time 8   Period Weeks   Status On-going     PT LONG TERM GOAL #3   Title Patient will have a gait speed of > or = 1.64f/s to indicate he is safe as a limited community ambulator. (TARGET DATE: 11/09/2016)    Time 8   Period Weeks   Status On-going     PT LONG TERM GOAL #4   Title Patient will achieve a Berg Balance Test score of >45 to indicate a decreased risk of falling. (TARGET DATE: 11/09/2016)    Time 8   Period Weeks   Status On-going     PT  LONG TERM GOAL #5   Title Patient will demonstrate ability to ambulate 500 feet with LRAD and prosthesis and supervision over unlevel surfaces (grass, curbs, and ramps) to indicate being able to return to patient's goal of golfing. (TARGET DATE: 11/09/2016)   Time 8   Period Weeks   Status On-going     PT LONG TERM GOAL #6   Title Functional Gait Assessment with cane >/= 14/30 (TARGET DATE: 11/09/2016)   Time 8   Period Weeks   Status On-going     PT LONG TERM GOAL #7   Title Patient will verbalize understanding and demonstrate proper prosthetic care to demonstrate safety in using prosthesis. (TARGET DATE: 11/09/2016)   Time 8   Period Weeks   Status On-going               Plan - 10/10/16 1911    Clinical Impression Statement Patient improved ability to negotiate ramps & curbs with cane & prosthesis but still needs someone with him for safety when attempting outside of PT.    Rehab Potential Good   Clinical Impairments Affecting Rehab Potential DM2, HTN, B cataracts, diabetic polyneuropathy, CAD, SVT, CHF, basal cell cancer on face, OA    PT Frequency 2x / week   PT Duration Other (comment)   PT Treatment/Interventions ADLs/Self Care Home Management;DME Instruction;Gait training;Stair training;Functional mobility training;Therapeutic activities;Therapeutic exercise;Balance training;Prosthetic Training;Orthotic Fit/Training;Patient/family education;Neuromuscular re-education;Manual techniques   PT Next Visit Plan prosthetic gait with cane outdoors if weather permits. Try golf swing.   Consulted and Agree with Plan of Care Patient;Family member/caregiver   Family Member Consulted daughter      Patient will benefit from skilled therapeutic intervention in order to improve the following deficits and impairments:  Abnormal gait, Decreased activity tolerance, Decreased balance, Decreased knowledge of precautions, Decreased endurance, Decreased knowledge of use of DME,  Decreased  mobility, Decreased range of motion, Decreased safety awareness, Difficulty walking, Decreased strength, Impaired sensation, Prosthetic Dependency, Postural dysfunction, Improper body mechanics, Pain  Visit Diagnosis: Unsteadiness on feet  Other abnormalities of gait and mobility  Muscle weakness (generalized)  Contracture of muscle, multiple sites       G-Codes - 11/07/16 1916    Functional Assessment Tool Used (Outpatient Only) Patient is wearing prosthesis 7-8 hrs with mild heat rash on limb. He verbalizes proper cleaning. He needs cues for proper donning & adjusting ply socks.    Functional Limitation Self care   Self Care Current Status [phone]) At least 20 percent but less than 40 percent impaired, limited or restricted   Self Care Goal Status (Q9169) At least 1 percent but less than 20 percent impaired, limited or restricted      Problem List Patient Active Problem List   Diagnosis Date Noted  . Diabetic polyneuropathy associated with type 2 diabetes mellitus ([place]) 05/31/2016  . H/O amputation of leg through tibia and fibula, right ([place]) 05/03/2016  . Phantom limb pain ([place]) 03/06/2016  . Heat rash 03/06/2016  . Below knee amputation status ([place]) 02/29/2016  . SVT (supraventricular tachycardia) ([place]) 01/20/2016  . Chronic systolic heart failure ([place]) 01/20/2016  . Sepsis ([place]) 12/10/2015  . Diabetic foot ulcers ([place]) 07/12/2015  . Dysphagia 08/06/2014  . Anxiety state 10/26/2013  . PVC (premature ventricular contraction) 07/27/2011  . Diabetes mellitus type II, uncontrolled ([place]) 11/02/2010  . BASAL CELL CARCINOMA, FACE 10/18/2008  . BASAL CELL CARCINOMA, FACE 10/18/2008  . LIPOMA, SKIN 05/31/2008  . DRY SKIN 05/31/2008  . ERECTILE DYSFUNCTION 09/30/2007  . OSTEOARTHRITIS 08/08/2007  . Essential hypertension 03/24/2007   Physical Therapy Progress Note  Dates of Reporting Period: 09/12/2016 to November 07, 2016  Objective Reports of Subjective Statement: Patient reports  increased wear & use of prosthesis with increased activity level.   Objective Measurements: see above  Goal Update: see above  Plan: continue established plan  Reason Skilled Services are Required: Patient requires skilled care to safely progress wear & use of prosthesis to return to community level activities.     Jamey Reas PT, DPT 10/11/2016, 9:20 AM  [facility] [address] [place], [place], [address] Phone: [phone]   Fax:  [phone]  Name: [name] MRN: [id number] Date of Birth: 01-24-1941

## 2016-10-17 ENCOUNTER — Ambulatory Visit: Payer: Medicare Other | Admitting: Physical Therapy

## 2016-10-17 ENCOUNTER — Encounter: Payer: Self-pay | Admitting: Physical Therapy

## 2016-10-17 DIAGNOSIS — M6281 Muscle weakness (generalized): Secondary | ICD-10-CM

## 2016-10-17 DIAGNOSIS — R2689 Other abnormalities of gait and mobility: Secondary | ICD-10-CM | POA: Diagnosis not present

## 2016-10-17 DIAGNOSIS — R2681 Unsteadiness on feet: Secondary | ICD-10-CM

## 2016-10-17 DIAGNOSIS — M6249 Contracture of muscle, multiple sites: Secondary | ICD-10-CM | POA: Diagnosis not present

## 2016-10-18 NOTE — Therapy (Signed)
[facility] [address] [place], [place], [address] Phone: [phone]   Fax:  [phone]  Physical Therapy Treatment  Patient Details  Name: [name] MRN: [id number] Date of Birth: Aug 12, 1940 Referring Provider: Meridee Score MD   Encounter Date: 10/17/2016      PT End of Session - 10/17/16 1536    Visit Number 11   Number of Visits 18   Date for PT Re-Evaluation 11/09/16   Authorization Type Medicare and G-codes    PT Start Time 1531-08-24   PT Stop Time 1615   PT Time Calculation (min) 42 min   Equipment Utilized During Treatment Gait belt   Activity Tolerance Patient tolerated treatment well   Behavior During Therapy [facility] for tasks assessed/performed      Past Medical History:  Diagnosis Date  . CAD (coronary artery disease)    Minimal plaque cath August 23, 2006  . Cataract    both  . Complication of anesthesia   . Diabetes mellitus   . ED (erectile dysfunction)   . Hypertension   . Osteoarthritis   . PONV (postoperative nausea and vomiting) 1960   with Ether  . Rheumatic fever   . Rhinitis     Past Surgical History:  Procedure Laterality Date  . AMPUTATION Right 02/29/2016   Procedure: Right Leg AMPUTATION BELOW KNEE with Wound Vac placement;  Surgeon: [name], MD;  Location: [facility];  Service: Orthopedics;  Laterality: Right;  . CHOLECYSTECTOMY  1983  . colonoscopy  2006, 2010  . INGUINAL HERNIA REPAIR Right 1960    There were no vitals filed for this visit.      Subjective Assessment - 10/17/16 1536    Subjective No new complaints. No falls or pain to report.    Patient is accompained by: Family member   Pertinent History R transtibial amputation, DM2, HTN, B cataracts, diabetic polyneuropathy, CAD, SVT, CHF, basal cell cancer on his face, OA    Limitations Lifting;Standing;Walking;House hold activities   Patient Stated Goals 18 holes of golf (using cart), walk and get around independently, get  back on feet to take care of wife (wheelchair bound with PD)    Pain Score 0-No pain           [facility] Adult PT Treatment/Exercise - 10/17/16 1537      Transfers   Transfers Sit to Stand;Stand to Sit   Sit to Stand 6: Modified independent (Device/Increase time);With upper extremity assist;From bed   Stand to Sit 6: Modified independent (Device/Increase time);With upper extremity assist;To bed     Ambulation/Gait   Ambulation/Gait Yes   Ambulation/Gait Assistance 5: Supervision;4: Min guard   Ambulation/Gait Assistance Details cues for posture, step length and weight shifting with use of cane. min guard assist progressing to supervision on complaint surfaces.,    Ambulation Distance (Feet) 450 Feet  x1, 120 x1 indoors; 230 x1 in/outdoors   Assistive device Straight cane;Prosthesis   Gait Pattern Step-through pattern;Decreased step length - right;Decreased step length - left;Decreased stance time - right;Decreased stride length;Decreased weight shift to right   Ambulation Surface Level;Indoor;Unlevel;Outdoor;Paved;Gravel;Grass;Other (comment)  rubber mulch   Stairs Yes   Stairs Assistance 4: Min guard   Stairs Assistance Details (indicate cue type and reason) rail/cane x 4 stairs with each cane, cues to advance hands and for weight shifting with stair negotiation.    Stair Management Technique One rail Right;One rail Left;Step to pattern;Forwards;With cane   Number of Stairs 4  x2  Height of Stairs 6   Ramp 4: Min assist   Ramp Details (indicate cue type and reason) with cane: cues on posture, step length, cane placement and weight shifting   Curb Other (comment);4: Min assist  min guard on 2cd rep   Curb Details (indicate cue type and reason) outdoor curb x2 reps with cane with min guard assist, cues on sequencing on 1st rep, no cues needed on 2cd rep     High Level Balance   High Level Balance Activities Negotiating over obstacles   High Level Balance Comments with  cane/prosthesis: stepping over 4 bolsters of  varied heights with reminder cues on sequencing at times, 4 laps forward performed                         Therapeutic Activites    Other Therapeutic Activities golf swing on solid floor x 6-7 swings, min guard assist with cues on weight shifting     Prosthetics   Current prosthetic wear tolerance (days/week)  daily   Current prosthetic wear tolerance (#hours/day)  5 hrs 2x today   Residual limb condition  mild heat rash distal to knee.    Education Provided Residual limb care;Proper wear schedule/adjustment;Proper weight-bearing schedule/adjustment   Person(s) Educated Patient;Child(ren)   Education Method Explanation;Demonstration;Verbal cues   Education Method Verbalized understanding;Verbal cues required;Needs further instruction   Donning Prosthesis Supervision   Doffing Prosthesis Supervision            PT Short Term Goals - 10/05/16 1600      PT SHORT TERM GOAL #1   Title Patient will verbalize understanding of initial HEP. (TARGET DATE: 10/05/2016)   Baseline 10/02/16: met with current HEP (sink program for balance and proprioception   Status Achieved     PT SHORT TERM GOAL #2   Title Patient will report wearing prosthesis > or = 8 hrs/day without signs of skin integrity issues. (TARGET DATE: 10/05/2016)   Baseline 10/02/16: met- pt wearing 4-5 hours 2 x day most days, has occasional days of only 1 wear time   Status Achieved     PT SHORT TERM GOAL #3   Title Patient will demonsrate ability to ambulate with supervision with RW and prosthesis 300 feet including ramps and curbs.    Baseline 10/02/16: met today   Status Achieved     PT SHORT TERM GOAL #4   Title Patient will demonstrate ability to safely reach forward 3 inches in standing with prosthesis and supervision to indicate a decrease in his risk of falling. (TARGET DATE: 10/05/2016)   Baseline MET 10/05/2016   Time 4   Period Weeks   Status Achieved     PT SHORT TERM GOAL  #5   Title Patient will independently demonstrate ability to don and doff prosthesis. (TARGET DATE: 10/05/2016)   Baseline 10/02/16: met today   Status Achieved           PT Long Term Goals - 10/10/16 1914      PT LONG TERM GOAL #1   Title Patient will verbalize understanding of ongoing HEP and fitness program. (TARGET DATE: 11/09/2016)   Time 8   Period Weeks   Status On-going     PT LONG TERM GOAL #2   Title Patient will report wearing prosthesis >90% of all waking hours without skin integrity issues to decrease his risk of falling. (TARGET DAT: 11/09/2016)    Time 8   Period Weeks   Status  On-going     PT LONG TERM GOAL #3   Title Patient will have a gait speed of > or = 1.43f/s to indicate he is safe as a limited community ambulator. (TARGET DATE: 11/09/2016)    Time 8   Period Weeks   Status On-going     PT LONG TERM GOAL #4   Title Patient will achieve a Berg Balance Test score of >45 to indicate a decreased risk of falling. (TARGET DATE: 11/09/2016)    Time 8   Period Weeks   Status On-going     PT LONG TERM GOAL #5   Title Patient will demonstrate ability to ambulate 500 feet with LRAD and prosthesis and supervision over unlevel surfaces (grass, curbs, and ramps) to indicate being able to return to patient's goal of golfing. (TARGET DATE: 11/09/2016)   Time 8   Period Weeks   Status On-going     PT LONG TERM GOAL #6   Title Functional Gait Assessment with cane >/= 14/30 (TARGET DATE: 11/09/2016)   Time 8   Period Weeks   Status On-going     PT LONG TERM GOAL #7   Title Patient will verbalize understanding and demonstrate proper prosthetic care to demonstrate safety in using prosthesis. (TARGET DATE: 11/09/2016)   Time 8   Period Weeks   Status On-going           Plan - 10/17/16 1537    Clinical Impression Statement today's skilled session continued to focus on gait with straight cane and prosthesis. Pt now safe to use cane for short distances with  supervision from family and inside home without supervision. Pt is progressing well towards goals and should benefit from continued PT to progress toward unmet goals.    Rehab Potential Good   Clinical Impairments Affecting Rehab Potential DM2, HTN, B cataracts, diabetic polyneuropathy, CAD, SVT, CHF, basal cell cancer on face, OA    PT Frequency 2x / week   PT Duration Other (comment)   PT Treatment/Interventions ADLs/Self Care Home Management;DME Instruction;Gait training;Stair training;Functional mobility training;Therapeutic activities;Therapeutic exercise;Balance training;Prosthetic Training;Orthotic Fit/Training;Patient/family education;Neuromuscular re-education;Manual techniques   PT Next Visit Plan continued to progress distance with cane, barriers with cane and balance   Consulted and Agree with Plan of Care Patient;Family member/caregiver   Family Member Consulted daughter      Patient will benefit from skilled therapeutic intervention in order to improve the following deficits and impairments:  Abnormal gait, Decreased activity tolerance, Decreased balance, Decreased knowledge of precautions, Decreased endurance, Decreased knowledge of use of DME, Decreased mobility, Decreased range of motion, Decreased safety awareness, Difficulty walking, Decreased strength, Impaired sensation, Prosthetic Dependency, Postural dysfunction, Improper body mechanics, Pain  Visit Diagnosis: Unsteadiness on feet  Other abnormalities of gait and mobility  Muscle weakness (generalized)     Problem List Patient Active Problem List   Diagnosis Date Noted  . Diabetic polyneuropathy associated with type 2 diabetes mellitus ([facility] 05/31/2016  . H/O amputation of leg through tibia and fibula, right ([facility] 05/03/2016  . Phantom limb pain ([facility] 03/06/2016  . Heat rash 03/06/2016  . Below knee amputation status ([facility] 02/29/2016  . SVT (supraventricular tachycardia) ([facility] 01/20/2016  . Chronic systolic  heart failure ([facility] 01/20/2016  . Sepsis ([facility] 12/10/2015  . Diabetic foot ulcers ([facility] 07/12/2015  . Dysphagia 08/06/2014  . Anxiety state 10/26/2013  . PVC (premature ventricular contraction) 07/27/2011  . Diabetes mellitus type II, uncontrolled ([facility] 11/02/2010  . BASAL CELL CARCINOMA, FACE 10/18/2008  . BASAL  CELL CARCINOMA, [place] 10/18/2008  . LIPOMA, SKIN 05/31/2008  . DRY SKIN 05/31/2008  . ERECTILE DYSFUNCTION 09/30/2007  . OSTEOARTHRITIS 08/08/2007  . Essential hypertension 03/24/2007    Willow Ora, PTA, [facility] [address] [place], [place] [phone] 10/18/16, 9:31 PM   Name: CASTER FAYETTE MRN: [id number] Date of Birth: [birth year]

## 2016-10-19 ENCOUNTER — Encounter: Payer: Self-pay | Admitting: Physical Therapy

## 2016-10-19 ENCOUNTER — Ambulatory Visit: Payer: Medicare Other | Admitting: Physical Therapy

## 2016-10-19 DIAGNOSIS — R2681 Unsteadiness on feet: Secondary | ICD-10-CM | POA: Diagnosis not present

## 2016-10-19 DIAGNOSIS — M6249 Contracture of muscle, multiple sites: Secondary | ICD-10-CM | POA: Diagnosis not present

## 2016-10-19 DIAGNOSIS — M6281 Muscle weakness (generalized): Secondary | ICD-10-CM | POA: Diagnosis not present

## 2016-10-19 DIAGNOSIS — R2689 Other abnormalities of gait and mobility: Secondary | ICD-10-CM | POA: Diagnosis not present

## 2016-10-19 NOTE — Therapy (Signed)
[facility] [address] [place], [place], [address] Phone: [phone]   Fax:  [phone]  Physical Therapy Treatment  Patient Details  Name: [name] MRN: [id number] Date of Birth: 1941/04/27 Referring Provider: Meridee Score MD   Encounter Date: 10/19/2016      PT End of Session - 10/19/16 1538    Visit Number 12   Number of Visits 18   Date for PT Re-Evaluation 11/09/16   Authorization Type Medicare and G-codes    PT Start Time Jul 31, 1531   PT Stop Time 1610/07/30   PT Time Calculation (min) 39 min   Equipment Utilized During Treatment Gait belt   Activity Tolerance Patient tolerated treatment well   Behavior During Therapy [facility] for tasks assessed/performed      Past Medical History:  Diagnosis Date  . CAD (coronary artery disease)    Minimal plaque cath July 30, 2006  . Cataract    both  . Complication of anesthesia   . Diabetes mellitus   . ED (erectile dysfunction)   . Hypertension   . Osteoarthritis   . PONV (postoperative nausea and vomiting) 1960   with Ether  . Rheumatic fever   . Rhinitis     Past Surgical History:  Procedure Laterality Date  . AMPUTATION Right 02/29/2016   Procedure: Right Leg AMPUTATION BELOW KNEE with Wound Vac placement;  Surgeon: [name], MD;  Location: [facility];  Service: Orthopedics;  Laterality: Right;  . CHOLECYSTECTOMY  1983  . colonoscopy  2006, 2010  . INGUINAL HERNIA REPAIR Right 1960    There were no vitals filed for this visit.      Subjective Assessment - 10/19/16 1537    Subjective No new complaints. No falls or pain to report. Did not go play golf today, "I just do not feel ready/stable enough for that yet".    Patient is accompained by: Family member   Pertinent History R transtibial amputation, DM2, HTN, B cataracts, diabetic polyneuropathy, CAD, SVT, CHF, basal cell cancer on his face, OA    Limitations Lifting;Standing;Walking;House hold activities   Patient  Stated Goals 18 holes of golf (using cart), walk and get around independently, get back on feet to take care of wife (wheelchair bound with PD)    Currently in Pain? No/denies   Pain Score 0-No pain             [facility] Adult PT Treatment/Exercise - 10/19/16 1539      Transfers   Transfers Sit to Stand;Stand to Sit   Sit to Stand 6: Modified independent (Device/Increase time);With upper extremity assist;From bed   Stand to Sit 6: Modified independent (Device/Increase time);With upper extremity assist;To bed     Ambulation/Gait   Ambulation/Gait Yes   Ambulation/Gait Assistance 5: Supervision;4: Min guard   Ambulation/Gait Assistance Details cues on posture, step length and weight shifting with gait. pt needed up to min guard on outdoor complaint surfaces, otherwise he was at a supervision level with use of cane for gait.    Ambulation Distance (Feet) 675 Feet  x1 indoors; 500 x1 in/outdoors   Assistive device Straight cane;Prosthesis   Gait Pattern Step-through pattern;Decreased step length - right;Decreased step length - left;Decreased stance time - right;Decreased stride length;Decreased weight shift to right   Ambulation Surface Level;Indoor;Unlevel;Outdoor;Paved;Grass;Other (comment)  rubber mulch     Prosthetics   Current prosthetic wear tolerance (days/week)  daily   Current prosthetic wear tolerance (#hours/day)  all awake hours. if he does  not have appt's he will take it off for a nap and if he has a bad rash he leaves everything off   Residual limb condition  moderate amount of heat rash on limb: distal to knee on anterior/posterior surfaces and above knee under liner area   Education Provided Residual limb care;Proper wear schedule/adjustment;Proper weight-bearing schedule/adjustment   Person(s) Educated Patient   Education Method Explanation;Demonstration;Verbal cues   Education Method Verbalized understanding;Returned demonstration;Verbal cues required   Donning  Prosthesis Supervision   Doffing Prosthesis Supervision            PT Short Term Goals - 10/05/16 1600      PT SHORT TERM GOAL #1   Title Patient will verbalize understanding of initial HEP. (TARGET DATE: 10/05/2016)   Baseline 10/02/16: met with current HEP (sink program for balance and proprioception   Status Achieved     PT SHORT TERM GOAL #2   Title Patient will report wearing prosthesis > or = 8 hrs/day without signs of skin integrity issues. (TARGET DATE: 10/05/2016)   Baseline 10/02/16: met- pt wearing 4-5 hours 2 x day most days, has occasional days of only 1 wear time   Status Achieved     PT SHORT TERM GOAL #3   Title Patient will demonsrate ability to ambulate with supervision with RW and prosthesis 300 feet including ramps and curbs.    Baseline 10/02/16: met today   Status Achieved     PT SHORT TERM GOAL #4   Title Patient will demonstrate ability to safely reach forward 3 inches in standing with prosthesis and supervision to indicate a decrease in his risk of falling. (TARGET DATE: 10/05/2016)   Baseline MET 10/05/2016   Time 4   Period Weeks   Status Achieved     PT SHORT TERM GOAL #5   Title Patient will independently demonstrate ability to don and doff prosthesis. (TARGET DATE: 10/05/2016)   Baseline 10/02/16: met today   Status Achieved           PT Long Term Goals - 10/10/16 1914      PT LONG TERM GOAL #1   Title Patient will verbalize understanding of ongoing HEP and fitness program. (TARGET DATE: 11/09/2016)   Time 8   Period Weeks   Status On-going     PT LONG TERM GOAL #2   Title Patient will report wearing prosthesis >90% of all waking hours without skin integrity issues to decrease his risk of falling. (TARGET DAT: 11/09/2016)    Time 8   Period Weeks   Status On-going     PT LONG TERM GOAL #3   Title Patient will have a gait speed of > or = 1.16f/s to indicate he is safe as a limited community ambulator. (TARGET DATE: 11/09/2016)    Time 8    Period Weeks   Status On-going     PT LONG TERM GOAL #4   Title Patient will achieve a Berg Balance Test score of >45 to indicate a decreased risk of falling. (TARGET DATE: 11/09/2016)    Time 8   Period Weeks   Status On-going     PT LONG TERM GOAL #5   Title Patient will demonstrate ability to ambulate 500 feet with LRAD and prosthesis and supervision over unlevel surfaces (grass, curbs, and ramps) to indicate being able to return to patient's goal of golfing. (TARGET DATE: 11/09/2016)   Time 8   Period Weeks   Status On-going     PT  LONG TERM GOAL #6   Title Functional Gait Assessment with cane >/= 14/30 (TARGET DATE: 11/09/2016)   Time 8   Period Weeks   Status On-going     PT LONG TERM GOAL #7   Title Patient will verbalize understanding and demonstrate proper prosthetic care to demonstrate safety in using prosthesis. (TARGET DATE: 11/09/2016)   Time 8   Period Weeks   Status On-going            Plan - 10/19/16 1538    Clinical Impression Statement Today's skilled session continued to address use of cane/prosthesis with gait on various surfaces. Pt is making steady progress toward goals and should benefit from continued PT to progress toward unmet goals.    Rehab Potential Good   Clinical Impairments Affecting Rehab Potential DM2, HTN, B cataracts, diabetic polyneuropathy, CAD, SVT, CHF, basal cell cancer on face, OA    PT Frequency 2x / week   PT Duration Other (comment)   PT Treatment/Interventions ADLs/Self Care Home Management;DME Instruction;Gait training;Stair training;Functional mobility training;Therapeutic activities;Therapeutic exercise;Balance training;Prosthetic Training;Orthotic Fit/Training;Patient/family education;Neuromuscular re-education;Manual techniques   PT Next Visit Plan continued to progress distance with cane, barriers with cane and balance   Consulted and Agree with Plan of Care Patient;Family member/caregiver   Family Member Consulted daughter       Patient will benefit from skilled therapeutic intervention in order to improve the following deficits and impairments:  Abnormal gait, Decreased activity tolerance, Decreased balance, Decreased knowledge of precautions, Decreased endurance, Decreased knowledge of use of DME, Decreased mobility, Decreased range of motion, Decreased safety awareness, Difficulty walking, Decreased strength, Impaired sensation, Prosthetic Dependency, Postural dysfunction, Improper body mechanics, Pain  Visit Diagnosis: Unsteadiness on feet  Other abnormalities of gait and mobility  Muscle weakness (generalized)     Problem List Patient Active Problem List   Diagnosis Date Noted  . Diabetic polyneuropathy associated with type 2 diabetes mellitus ([place]) 05/31/2016  . H/O amputation of leg through tibia and fibula, right ([facility]) 05/03/2016  . Phantom limb pain ([place]) 03/06/2016  . Heat rash 03/06/2016  . Below knee amputation status ([place]) 02/29/2016  . SVT (supraventricular tachycardia) ([place]) 01/20/2016  . Chronic systolic heart failure ([place]) 01/20/2016  . Sepsis ([place]) 12/10/2015  . Diabetic foot ulcers ([place]) 07/12/2015  . Dysphagia 08/06/2014  . Anxiety state 10/26/2013  . PVC (premature ventricular contraction) 07/27/2011  . Diabetes mellitus type II, uncontrolled ([place]) 11/02/2010  . BASAL CELL CARCINOMA, FACE 10/18/2008  . BASAL CELL CARCINOMA, FACE 10/18/2008  . LIPOMA, SKIN 05/31/2008  . DRY SKIN 05/31/2008  . ERECTILE DYSFUNCTION 09/30/2007  . OSTEOARTHRITIS 08/08/2007  . Essential hypertension 03/24/2007    Willow Ora, PTA, [facility] [address] [place], [place] [address] [phone] 10/19/16, 10:13 PM   Name: [name] MRN: [id number] Date of Birth: 29-Jan-1941

## 2016-10-24 ENCOUNTER — Encounter: Payer: Self-pay | Admitting: Physical Therapy

## 2016-10-24 ENCOUNTER — Ambulatory Visit: Payer: Medicare Other | Admitting: Physical Therapy

## 2016-10-24 DIAGNOSIS — R2689 Other abnormalities of gait and mobility: Secondary | ICD-10-CM | POA: Diagnosis not present

## 2016-10-24 DIAGNOSIS — M6281 Muscle weakness (generalized): Secondary | ICD-10-CM | POA: Diagnosis not present

## 2016-10-24 DIAGNOSIS — R2681 Unsteadiness on feet: Secondary | ICD-10-CM

## 2016-10-24 DIAGNOSIS — M6249 Contracture of muscle, multiple sites: Secondary | ICD-10-CM | POA: Diagnosis not present

## 2016-10-25 ENCOUNTER — Ambulatory Visit: Payer: Medicare Other | Admitting: Physical Therapy

## 2016-10-25 DIAGNOSIS — D485 Neoplasm of uncertain behavior of skin: Secondary | ICD-10-CM | POA: Diagnosis not present

## 2016-10-25 DIAGNOSIS — C44329 Squamous cell carcinoma of skin of other parts of face: Secondary | ICD-10-CM | POA: Diagnosis not present

## 2016-10-25 DIAGNOSIS — L570 Actinic keratosis: Secondary | ICD-10-CM | POA: Diagnosis not present

## 2016-10-25 DIAGNOSIS — Z85828 Personal history of other malignant neoplasm of skin: Secondary | ICD-10-CM | POA: Diagnosis not present

## 2016-10-25 NOTE — Therapy (Signed)
[facility] [address] [place], [place], [address] Phone: [phone]   Fax:  [phone]  Physical Therapy Treatment  Patient Details  Name: [name] MRN: [id number] Date of Birth: April 01, 1941 Referring Provider: Meridee Score MD   Encounter Date: 10/24/2016      PT End of Session - 10/24/16 1537    Visit Number 13   Number of Visits 18   Date for PT Re-Evaluation 11/09/16   Authorization Type Medicare and G-codes    PT Start Time 23-Aug-1530   PT Stop Time 1615   PT Time Calculation (min) 43 min   Equipment Utilized During Treatment Gait belt   Activity Tolerance Patient tolerated treatment well   Behavior During Therapy [facility] for tasks assessed/performed      Past Medical History:  Diagnosis Date  . CAD (coronary artery disease)    Minimal plaque cath Aug 23, 2006  . Cataract    both  . Complication of anesthesia   . Diabetes mellitus   . ED (erectile dysfunction)   . Hypertension   . Osteoarthritis   . PONV (postoperative nausea and vomiting) 1960   with Ether  . Rheumatic fever   . Rhinitis     Past Surgical History:  Procedure Laterality Date  . AMPUTATION Right 02/29/2016   Procedure: Right Leg AMPUTATION BELOW KNEE with Wound Vac placement;  Surgeon: [name], MD;  Location: [facility];  Service: Orthopedics;  Laterality: Right;  . CHOLECYSTECTOMY  1983  . colonoscopy  2006, 2010  . INGUINAL HERNIA REPAIR Right 1960    There were no vitals filed for this visit.      Subjective Assessment - 10/24/16 1537    Subjective No new complaints. No falls or pain to report.   Patient is accompained by: Family member   Pertinent History R transtibial amputation, DM2, HTN, B cataracts, diabetic polyneuropathy, CAD, SVT, CHF, basal cell cancer on his face, OA    Limitations Lifting;Standing;Walking;House hold activities   Patient Stated Goals 18 holes of golf (using cart), walk and get around independently, get  back on feet to take care of wife (wheelchair bound with PD)    Currently in Pain? No/denies   Pain Score 0-No pain             [facility] Adult PT Treatment/Exercise - 10/24/16 1539      Transfers   Transfers Sit to Stand;Stand to Sit   Sit to Stand 6: Modified independent (Device/Increase time);With upper extremity assist;From bed   Stand to Sit 6: Modified independent (Device/Increase time);With upper extremity assist;To bed     Ambulation/Gait   Ambulation/Gait Yes   Ambulation/Gait Assistance 5: Supervision;4: Min guard  min guard on gravel/grass only   Ambulation/Gait Assistance Details cues on posture, weight shifting and step lenght. pt tends to keep prosthesis abducted out, cues to correct base of support with gait.    Ambulation Distance (Feet) 250 Feet  x1 indoors; 600 in/outdoors combined   Assistive device Straight cane;Prosthesis   Gait Pattern Step-through pattern;Decreased step length - right;Decreased step length - left;Decreased stance time - right;Decreased stride length;Decreased weight shift to right   Ambulation Surface Level;Unlevel;Indoor;Outdoor;Paved;Gravel;Grass   Ramp Other (comment)  min guard assist with cane   Ramp Details (indicate cue type and reason) cues on posture, sequencing and step length   Curb Other (comment)  min guard assist with cane   Curb Details (indicate cue type and reason) cues on stance position and sequencing  Neuro Re-ed    Neuro Re-ed Details  standing on floor: swinging golf club x 5-6 reps with cues on weight shifting with swing, progressing to standing on blue mat swinging golf club x 8-9 reps with min assist for weight shifting.                     Prosthetics   Current prosthetic wear tolerance (days/week)  daily   Current prosthetic wear tolerance (#hours/day)  all awake hours. if he does not have appt's he will take it off for a nap and if he has a bad rash he leaves everything off   Residual limb condition  pt reports  heat rash has cleared up, no issues to report.    Donning Prosthesis Supervision   Doffing Prosthesis Supervision            PT Short Term Goals - 10/05/16 1600      PT SHORT TERM GOAL #1   Title Patient will verbalize understanding of initial HEP. (TARGET DATE: 10/05/2016)   Baseline 10/02/16: met with current HEP (sink program for balance and proprioception   Status Achieved     PT SHORT TERM GOAL #2   Title Patient will report wearing prosthesis > or = 8 hrs/day without signs of skin integrity issues. (TARGET DATE: 10/05/2016)   Baseline 10/02/16: met- pt wearing 4-5 hours 2 x day most days, has occasional days of only 1 wear time   Status Achieved     PT SHORT TERM GOAL #3   Title Patient will demonsrate ability to ambulate with supervision with RW and prosthesis 300 feet including ramps and curbs.    Baseline 10/02/16: met today   Status Achieved     PT SHORT TERM GOAL #4   Title Patient will demonstrate ability to safely reach forward 3 inches in standing with prosthesis and supervision to indicate a decrease in his risk of falling. (TARGET DATE: 10/05/2016)   Baseline MET 10/05/2016   Time 4   Period Weeks   Status Achieved     PT SHORT TERM GOAL #5   Title Patient will independently demonstrate ability to don and doff prosthesis. (TARGET DATE: 10/05/2016)   Baseline 10/02/16: met today   Status Achieved           PT Long Term Goals - 10/10/16 1914      PT LONG TERM GOAL #1   Title Patient will verbalize understanding of ongoing HEP and fitness program. (TARGET DATE: 11/09/2016)   Time 8   Period Weeks   Status On-going     PT LONG TERM GOAL #2   Title Patient will report wearing prosthesis >90% of all waking hours without skin integrity issues to decrease his risk of falling. (TARGET DAT: 11/09/2016)    Time 8   Period Weeks   Status On-going     PT LONG TERM GOAL #3   Title Patient will have a gait speed of > or = 1.6f/s to indicate he is safe as a limited  community ambulator. (TARGET DATE: 11/09/2016)    Time 8   Period Weeks   Status On-going     PT LONG TERM GOAL #4   Title Patient will achieve a Berg Balance Test score of >45 to indicate a decreased risk of falling. (TARGET DATE: 11/09/2016)    Time 8   Period Weeks   Status On-going     PT LONG TERM GOAL #5   Title Patient will demonstrate  ability to ambulate 500 feet with LRAD and prosthesis and supervision over unlevel surfaces (grass, curbs, and ramps) to indicate being able to return to patient's goal of golfing. (TARGET DATE: 11/09/2016)   Time 8   Period Weeks   Status On-going     PT LONG TERM GOAL #6   Title Functional Gait Assessment with cane >/= 14/30 (TARGET DATE: 11/09/2016)   Time 8   Period Weeks   Status On-going     PT LONG TERM GOAL #7   Title Patient will verbalize understanding and demonstrate proper prosthetic care to demonstrate safety in using prosthesis. (TARGET DATE: 11/09/2016)   Time 8   Period Weeks   Status On-going           Plan - 10/24/16 1538    Clinical Impression Statement Today's skilled session continued to address use of cane on various surfaces and barriers with no significant issues noted. Pt does report increased hip pain with increased activity that resolves with rest breaks. Remainder of session work on golf swing on solid floor and complaint surfaces. Pt is challenged by complaint surfaces. Pt is progressing toward unmet goals and should benefit from continued PT to progress toward unmet goals.    Rehab Potential Good   Clinical Impairments Affecting Rehab Potential DM2, HTN, B cataracts, diabetic polyneuropathy, CAD, SVT, CHF, basal cell cancer on face, OA    PT Frequency 2x / week   PT Duration Other (comment)   PT Treatment/Interventions ADLs/Self Care Home Management;DME Instruction;Gait training;Stair training;Functional mobility training;Therapeutic activities;Therapeutic exercise;Balance training;Prosthetic Training;Orthotic  Fit/Training;Patient/family education;Neuromuscular re-education;Manual techniques   PT Next Visit Plan continued to progress distance with cane, balance- balance board, dynamic gait, single leg stance and complaint surfaces; golf swing outside on grass with balls/golf tee's   Consulted and Agree with Plan of Care Patient;Family member/caregiver   Family Member Consulted daughter      Patient will benefit from skilled therapeutic intervention in order to improve the following deficits and impairments:  Abnormal gait, Decreased activity tolerance, Decreased balance, Decreased knowledge of precautions, Decreased endurance, Decreased knowledge of use of DME, Decreased mobility, Decreased range of motion, Decreased safety awareness, Difficulty walking, Decreased strength, Impaired sensation, Prosthetic Dependency, Postural dysfunction, Improper body mechanics, Pain  Visit Diagnosis: Unsteadiness on feet  Other abnormalities of gait and mobility  Muscle weakness (generalized)     Problem List Patient Active Problem List   Diagnosis Date Noted  . Diabetic polyneuropathy associated with type 2 diabetes mellitus ([place]) 05/31/2016  . H/O amputation of leg through tibia and fibula, right ([place]) 05/03/2016  . Phantom limb pain ([place]) 03/06/2016  . Heat rash 03/06/2016  . Below knee amputation status ([place]) 02/29/2016  . SVT (supraventricular tachycardia) ([place]) 01/20/2016  . Chronic systolic heart failure ([place]) 01/20/2016  . Sepsis ([place]) 12/10/2015  . Diabetic foot ulcers ([place]) 07/12/2015  . Dysphagia 08/06/2014  . Anxiety state 10/26/2013  . PVC (premature ventricular contraction) 07/27/2011  . Diabetes mellitus type II, uncontrolled ([place]) 11/02/2010  . BASAL CELL CARCINOMA, FACE 10/18/2008  . BASAL CELL CARCINOMA, FACE 10/18/2008  . LIPOMA, SKIN 05/31/2008  . DRY SKIN 05/31/2008  . ERECTILE DYSFUNCTION 09/30/2007  . OSTEOARTHRITIS 08/08/2007  . Essential hypertension 03/24/2007    Willow Ora, PTA, [facility] [address], [place], [place] [address] [phone] 10/25/16, 8:30 PM   Name: JAKAREE PICKARD MRN: [id number] Date of Birth: 1941/04/22

## 2016-10-26 ENCOUNTER — Ambulatory Visit: Payer: Medicare Other | Attending: Orthopedic Surgery | Admitting: Physical Therapy

## 2016-10-26 ENCOUNTER — Encounter: Payer: Self-pay | Admitting: Physical Therapy

## 2016-10-26 DIAGNOSIS — M6249 Contracture of muscle, multiple sites: Secondary | ICD-10-CM | POA: Diagnosis not present

## 2016-10-26 DIAGNOSIS — R2689 Other abnormalities of gait and mobility: Secondary | ICD-10-CM | POA: Diagnosis not present

## 2016-10-26 DIAGNOSIS — M6281 Muscle weakness (generalized): Secondary | ICD-10-CM

## 2016-10-26 DIAGNOSIS — R2681 Unsteadiness on feet: Secondary | ICD-10-CM

## 2016-10-28 NOTE — Therapy (Signed)
[facility] [address] [place], [place], [address] Phone: [phone]   Fax:  [phone]  Physical Therapy Treatment  Patient Details  Name: [name] MRN: [id number] Date of Birth: Sep 02, 1940 Referring Provider: Meridee Score MD   Encounter Date: 10/26/2016   10/26/16 1500  PT Visits / Re-Eval  Visit Number 14  Number of Visits 18  Date for PT Re-Evaluation 11/09/16  Authorization  Authorization Type Medicare and G-codes   PT Time Calculation  PT Start Time 4801 (pt late for apt today)  PT Stop Time 1530  PT Time Calculation (min) 36 min  PT - End of Session  Equipment Utilized During Treatment Gait belt  Activity Tolerance Patient tolerated treatment well  Behavior During Therapy [facility] for tasks assessed/performed     Past Medical History:  Diagnosis Date  . CAD (coronary artery disease)    Minimal plaque cath 2008  . Cataract    both  . Complication of anesthesia   . Diabetes mellitus   . ED (erectile dysfunction)   . Hypertension   . Osteoarthritis   . PONV (postoperative nausea and vomiting) 1960   with Ether  . Rheumatic fever   . Rhinitis     Past Surgical History:  Procedure Laterality Date  . AMPUTATION Right 02/29/2016   Procedure: Right Leg AMPUTATION BELOW KNEE with Wound Vac placement;  Surgeon: [name], MD;  Location: [facility];  Service: Orthopedics;  Laterality: Right;  . CHOLECYSTECTOMY  1983  . colonoscopy  2006, 2010  . INGUINAL HERNIA REPAIR Right 1960    There were no vitals filed for this visit.   10/26/16 1458  Symptoms/Limitations  Subjective No new complaints. No falls or pain to report. Does have a bruise on inner thigh of residual limb, not sure where it came from. Noticed it this am in shower  Patient is accompained by: Family member  Pertinent History R transtibial amputation, DM2, HTN, B cataracts, diabetic polyneuropathy, CAD, SVT, CHF, basal cell cancer on  his face, OA   Limitations Lifting;Standing;Walking;House hold activities  Patient Stated Goals 18 holes of golf (using cart), walk and get around independently, get back on feet to take care of wife (wheelchair bound with PD)   Pain Assessment  Currently in Pain? No/denies  Pain Score 0      10/26/16 1501  Transfers  Transfers Sit to Stand;Stand to Sit  Sit to Stand 6: Modified independent (Device/Increase time);With upper extremity assist;From bed  Stand to Sit 6: Modified independent (Device/Increase time);With upper extremity assist;To bed  Ambulation/Gait  Ambulation/Gait Yes  Ambulation/Gait Assistance 5: Supervision  Ambulation/Gait Assistance Details cues to look forward with gait (vs at foot/floor), for more narrowed base of support and for increased step length with gait  Ambulation Distance (Feet) 440 Feet (x1 indoors)  Assistive device Straight cane;Prosthesis  Gait Pattern Step-through pattern;Decreased step length - right;Decreased step length - left;Decreased stance time - right;Decreased stride length;Decreased weight shift to right  Ambulation Surface Level;Indoor  High Level Balance  High Level Balance Activities Side stepping;Marching forwards;Marching backwards  High Level Balance Comments in parallel bars with no UE support x 3 laps each way, min guard to min assist with cues on posture, weight shifting and ex form/technique      10/26/16 1515  Balance Exercises: Standing  Standing Eyes Opened Wide (BOA);Foam/compliant surface;Limitations  Standing Eyes Closed Wide (BOA);Head turns;Foam/compliant surface;Other reps (comment);20 secs;Limitations  SLS with Vectors Solid surface;Other reps (comment);Limitations  Balance Exercises: Standing  Standing Eyes Closed Limitations on airex with wide base of support with EO: alternating UE raises x 10 reps each, then bil UE raises x 10 reps each with cues on posture and ex form  Standing Eyes Opened Limitations on airex  with wide base of support with EC: no head movements 20 sec's x 3 reps, then with head movements left<>right and up<>down x 10 reps each.  SLS with Vectors Limitations on floor in parallel bars with light fingertip touch: alternating fwd foot taps to 4 inch box x 10 reps each side with cues posture and weight shifting           PT Short Term Goals - 10/05/16 1600      PT SHORT TERM GOAL #1   Title Patient will verbalize understanding of initial HEP. (TARGET DATE: 10/05/2016)   Baseline 10/02/16: met with current HEP (sink program for balance and proprioception   Status Achieved     PT SHORT TERM GOAL #2   Title Patient will report wearing prosthesis > or = 8 hrs/day without signs of skin integrity issues. (TARGET DATE: 10/05/2016)   Baseline 10/02/16: met- pt wearing 4-5 hours 2 x day most days, has occasional days of only 1 wear time   Status Achieved     PT SHORT TERM GOAL #3   Title Patient will demonsrate ability to ambulate with supervision with RW and prosthesis 300 feet including ramps and curbs.    Baseline 10/02/16: met today   Status Achieved     PT SHORT TERM GOAL #4   Title Patient will demonstrate ability to safely reach forward 3 inches in standing with prosthesis and supervision to indicate a decrease in his risk of falling. (TARGET DATE: 10/05/2016)   Baseline MET 10/05/2016   Time 4   Period Weeks   Status Achieved     PT SHORT TERM GOAL #5   Title Patient will independently demonstrate ability to don and doff prosthesis. (TARGET DATE: 10/05/2016)   Baseline 10/02/16: met today   Status Achieved           PT Long Term Goals - 10/10/16 1914      PT LONG TERM GOAL #1   Title Patient will verbalize understanding of ongoing HEP and fitness program. (TARGET DATE: 11/09/2016)   Time 8   Period Weeks   Status On-going     PT LONG TERM GOAL #2   Title Patient will report wearing prosthesis >90% of all waking hours without skin integrity issues to decrease his risk of  falling. (TARGET DAT: 11/09/2016)    Time 8   Period Weeks   Status On-going     PT LONG TERM GOAL #3   Title Patient will have a gait speed of > or = 1.84f/s to indicate he is safe as a limited community ambulator. (TARGET DATE: 11/09/2016)    Time 8   Period Weeks   Status On-going     PT LONG TERM GOAL #4   Title Patient will achieve a Berg Balance Test score of >45 to indicate a decreased risk of falling. (TARGET DATE: 11/09/2016)    Time 8   Period Weeks   Status On-going     PT LONG TERM GOAL #5   Title Patient will demonstrate ability to ambulate 500 feet with LRAD and prosthesis and supervision over unlevel surfaces (grass, curbs, and ramps) to indicate being able to return to patient's goal of golfing. (TARGET DATE: 11/09/2016)  Time 8   Period Weeks   Status On-going     PT LONG TERM GOAL #6   Title Functional Gait Assessment with cane >/= 14/30 (TARGET DATE: 11/09/2016)   Time 8   Period Weeks   Status On-going     PT LONG TERM GOAL #7   Title Patient will verbalize understanding and demonstrate proper prosthetic care to demonstrate safety in using prosthesis. (TARGET DATE: 11/09/2016)   Time 8   Period Weeks   Status On-going        10/26/16 1501  Plan  Clinical Impression Statement Today's skilled session focused on gait with cane progressing distance before needing a rest break and on balance activities. Pt is making steady progress toward goals and should benefit from continued PT to progress toward unmet goals.   Pt will benefit from skilled therapeutic intervention in order to improve on the following deficits Abnormal gait;Decreased activity tolerance;Decreased balance;Decreased knowledge of precautions;Decreased endurance;Decreased knowledge of use of DME;Decreased mobility;Decreased range of motion;Decreased safety awareness;Difficulty walking;Decreased strength;Impaired sensation;Prosthetic Dependency;Postural dysfunction;Improper body mechanics;Pain  Rehab  Potential Good  Clinical Impairments Affecting Rehab Potential DM2, HTN, B cataracts, diabetic polyneuropathy, CAD, SVT, CHF, basal cell cancer on face, OA   PT Frequency 2x / week  PT Duration Other (comment)  PT Treatment/Interventions ADLs/Self Care Home Management;DME Instruction;Gait training;Stair training;Functional mobility training;Therapeutic activities;Therapeutic exercise;Balance training;Prosthetic Training;Orthotic Fit/Training;Patient/family education;Neuromuscular re-education;Manual techniques  PT Next Visit Plan continued to progress distance with cane, balance- balance board, dynamic gait, single leg stance and complaint surfaces; golf swing outside on grass with balls/golf tee's  Consulted and Agree with Plan of Care Patient;Family member/caregiver  Family Member Consulted daughter       Patient will benefit from skilled therapeutic intervention in order to improve the following deficits and impairments:  Abnormal gait, Decreased activity tolerance, Decreased balance, Decreased knowledge of precautions, Decreased endurance, Decreased knowledge of use of DME, Decreased mobility, Decreased range of motion, Decreased safety awareness, Difficulty walking, Decreased strength, Impaired sensation, Prosthetic Dependency, Postural dysfunction, Improper body mechanics, Pain  Visit Diagnosis: Unsteadiness on feet  Other abnormalities of gait and mobility  Muscle weakness (generalized)     Problem List Patient Active Problem List   Diagnosis Date Noted  . Diabetic polyneuropathy associated with type 2 diabetes mellitus ([place]) 05/31/2016  . H/O amputation of leg through tibia and fibula, right ([place]) 05/03/2016  . Phantom limb pain ([place]) 03/06/2016  . Heat rash 03/06/2016  . Below knee amputation status ([place]) 02/29/2016  . SVT (supraventricular tachycardia) ([place]) 01/20/2016  . Chronic systolic heart failure ([place]) 01/20/2016  . Sepsis ([place]) 12/10/2015  . Diabetic foot ulcers  ([place]) 07/12/2015  . Dysphagia 08/06/2014  . Anxiety state 10/26/2013  . PVC (premature ventricular contraction) 07/27/2011  . Diabetes mellitus type II, uncontrolled ([place]) 11/02/2010  . BASAL CELL CARCINOMA, FACE 10/18/2008  . BASAL CELL CARCINOMA, FACE 10/18/2008  . LIPOMA, SKIN 05/31/2008  . DRY SKIN 05/31/2008  . ERECTILE DYSFUNCTION 09/30/2007  . OSTEOARTHRITIS 08/08/2007  . Essential hypertension 03/24/2007    Willow Ora, PTA, [facility] [address] [place], [place] [address] [phone] 10/28/16, 8:54 PM   Name: [name] MRN: [id number] Date of Birth: May 15, 1941

## 2016-11-01 ENCOUNTER — Encounter: Payer: Self-pay | Admitting: Physical Therapy

## 2016-11-01 ENCOUNTER — Ambulatory Visit: Payer: Medicare Other | Admitting: Physical Therapy

## 2016-11-01 DIAGNOSIS — R2689 Other abnormalities of gait and mobility: Secondary | ICD-10-CM | POA: Diagnosis not present

## 2016-11-01 DIAGNOSIS — M6281 Muscle weakness (generalized): Secondary | ICD-10-CM | POA: Diagnosis not present

## 2016-11-01 DIAGNOSIS — R2681 Unsteadiness on feet: Secondary | ICD-10-CM

## 2016-11-01 DIAGNOSIS — M6249 Contracture of muscle, multiple sites: Secondary | ICD-10-CM | POA: Diagnosis not present

## 2016-11-01 NOTE — Therapy (Signed)
[facility] [address] [place], [place], [address] Phone: [phone]   Fax:  [phone]  Physical Therapy Treatment  Patient Details  Name: [name] MRN: [id number] Date of Birth: Aug 31, 1940 Referring Provider: Meridee Score MD   Encounter Date: 11/01/2016      PT End of Session - 11/01/16 1541    Visit Number 15   Number of Visits 18   Date for PT Re-Evaluation 11/09/16   Authorization Type Medicare and G-codes    PT Start Time 4496   PT Stop Time 1615   PT Time Calculation (min) 40 min   Equipment Utilized During Treatment Gait belt   Activity Tolerance Patient tolerated treatment well   Behavior During Therapy [facility] for tasks assessed/performed      Past Medical History:  Diagnosis Date  . CAD (coronary artery disease)    Minimal plaque cath 2008  . Cataract    both  . Complication of anesthesia   . Diabetes mellitus   . ED (erectile dysfunction)   . Hypertension   . Osteoarthritis   . PONV (postoperative nausea and vomiting) 1960   with Ether  . Rheumatic fever   . Rhinitis     Past Surgical History:  Procedure Laterality Date  . AMPUTATION Right 02/29/2016   Procedure: Right Leg AMPUTATION BELOW KNEE with Wound Vac placement;  Surgeon: [name], MD;  Location: [facility];  Service: Orthopedics;  Laterality: Right;  . CHOLECYSTECTOMY  1983  . colonoscopy  2006, 2010  . INGUINAL HERNIA REPAIR Right 1960    There were no vitals filed for this visit.      Subjective Assessment - 11/01/16 1539    Subjective No new complaitns. No falls or pain to report. Bruise is clearing up.   Patient is accompained by: Family member   Pertinent History R transtibial amputation, DM2, HTN, B cataracts, diabetic polyneuropathy, CAD, SVT, CHF, basal cell cancer on his face, OA    Limitations Lifting;Standing;Walking;House hold activities   Patient Stated Goals 18 holes of golf (using cart), walk and get  around independently, get back on feet to take care of wife (wheelchair bound with PD)    Currently in Pain? No/denies   Pain Score 0-No pain           [facility] Adult PT Treatment/Exercise - 11/01/16 1542      Transfers   Transfers Sit to Stand;Stand to Sit   Sit to Stand 6: Modified independent (Device/Increase time);With upper extremity assist;From bed   Stand to Sit 6: Modified independent (Device/Increase time);With upper extremity assist;To bed     Ambulation/Gait   Ambulation/Gait Yes   Ambulation/Gait Assistance 5: Supervision;4: Min guard   Ambulation/Gait Assistance Details min guard assist with gait going down grassy hill, otherwise pt at supervision level   Ambulation Distance (Feet) 500 Feet  x1 in/outdoors   Assistive device Straight cane;Prosthesis   Gait Pattern Step-through pattern;Decreased step length - right;Decreased step length - left;Decreased stance time - right;Decreased stride length;Decreased weight shift to right   Ambulation Surface Level;Unlevel;Indoor;Outdoor;Paved;Grass;Other (comment)  rubber mulch   Gait velocity 24.0 sec's= 1.37 ft/sec with cane     High Level Balance   High Level Balance Activities Marching forwards;Marching backwards;Tandem walking;Negotiating over obstacles;Other (comment)  gait with enviromental scanning   High Level Balance Comments with cane: gait around gym while scanning enviroment with min guard assist for balance and cues to keep walking while scanning; stepping over 4 bolsters  of varied heights with cane with mostly min guard assist, one episode of min assist. cues on sequencing and posture; in parallel bars: marching fwd/bwd and tandem gait fwd/bwd x 3 laps each with min guard assist and no to light UE support on parallel bars.                                 Prosthetics   Current prosthetic wear tolerance (days/week)  daily   Current prosthetic wear tolerance (#hours/day)  all awake hours. if he does not have appt's he  will take it off for a nap and if he has a bad rash he leaves everything off   Residual limb condition  intact per pt   Education Provided Residual limb care;Proper weight-bearing schedule/adjustment   Person(s) Educated Patient   Education Method Explanation;Demonstration;Verbal cues   Education Method Verbalized understanding;Returned Doctor, general practice Prosthesis Supervision   Doffing Prosthesis Supervision            PT Short Term Goals - 10/05/16 1600      PT SHORT TERM GOAL #1   Title Patient will verbalize understanding of initial HEP. (TARGET DATE: 10/05/2016)   Baseline 10/02/16: met with current HEP (sink program for balance and proprioception   Status Achieved     PT SHORT TERM GOAL #2   Title Patient will report wearing prosthesis > or = 8 hrs/day without signs of skin integrity issues. (TARGET DATE: 10/05/2016)   Baseline 10/02/16: met- pt wearing 4-5 hours 2 x day most days, has occasional days of only 1 wear time   Status Achieved     PT SHORT TERM GOAL #3   Title Patient will demonsrate ability to ambulate with supervision with RW and prosthesis 300 feet including ramps and curbs.    Baseline 10/02/16: met today   Status Achieved     PT SHORT TERM GOAL #4   Title Patient will demonstrate ability to safely reach forward 3 inches in standing with prosthesis and supervision to indicate a decrease in his risk of falling. (TARGET DATE: 10/05/2016)   Baseline MET 10/05/2016   Time 4   Period Weeks   Status Achieved     PT SHORT TERM GOAL #5   Title Patient will independently demonstrate ability to don and doff prosthesis. (TARGET DATE: 10/05/2016)   Baseline 10/02/16: met today   Status Achieved           PT Long Term Goals - 10/10/16 1914      PT LONG TERM GOAL #1   Title Patient will verbalize understanding of ongoing HEP and fitness program. (TARGET DATE: 11/09/2016)   Time 8   Period Weeks   Status On-going     PT LONG TERM GOAL #2   Title Patient will  report wearing prosthesis >90% of all waking hours without skin integrity issues to decrease his risk of falling. (TARGET DAT: 11/09/2016)    Time 8   Period Weeks   Status On-going     PT LONG TERM GOAL #3   Title Patient will have a gait speed of > or = 1.39f/s to indicate he is safe as a limited community ambulator. (TARGET DATE: 11/09/2016)    Time 8   Period Weeks   Status On-going     PT LONG TERM GOAL #4   Title Patient will achieve a Berg Balance Test score of >45 to indicate a decreased risk  of falling. (TARGET DATE: 11/09/2016)    Time 8   Period Weeks   Status On-going     PT LONG TERM GOAL #5   Title Patient will demonstrate ability to ambulate 500 feet with LRAD and prosthesis and supervision over unlevel surfaces (grass, curbs, and ramps) to indicate being able to return to patient's goal of golfing. (TARGET DATE: 11/09/2016)   Time 8   Period Weeks   Status On-going     PT LONG TERM GOAL #6   Title Functional Gait Assessment with cane >/= 14/30 (TARGET DATE: 11/09/2016)   Time 8   Period Weeks   Status On-going     PT LONG TERM GOAL #7   Title Patient will verbalize understanding and demonstrate proper prosthetic care to demonstrate safety in using prosthesis. (TARGET DATE: 11/09/2016)   Time 8   Period Weeks   Status On-going            Plan - 11/01/16 1541    Clinical Impression Statement Todays skilled session addressed gait with cane over various surfaces and high level balance. Pt is progressing towards goals and should benefit from continued PT to progress toward unmet goals.    Rehab Potential Good   Clinical Impairments Affecting Rehab Potential DM2, HTN, B cataracts, diabetic polyneuropathy, CAD, SVT, CHF, basal cell cancer on face, OA    PT Frequency 2x / week   PT Duration Other (comment)   PT Treatment/Interventions ADLs/Self Care Home Management;DME Instruction;Gait training;Stair training;Functional mobility training;Therapeutic  activities;Therapeutic exercise;Balance training;Prosthetic Training;Orthotic Fit/Training;Patient/family education;Neuromuscular re-education;Manual techniques   PT Next Visit Plan continued balance- balance board, dynamic gait, single leg stance and complaint surfaces; golf swing outside on grass with balls/golf tee's; gait for short distances without AD   Consulted and Agree with Plan of Care Patient;Family member/caregiver   Family Member Consulted daughter      Patient will benefit from skilled therapeutic intervention in order to improve the following deficits and impairments:  Abnormal gait, Decreased activity tolerance, Decreased balance, Decreased knowledge of precautions, Decreased endurance, Decreased knowledge of use of DME, Decreased mobility, Decreased range of motion, Decreased safety awareness, Difficulty walking, Decreased strength, Impaired sensation, Prosthetic Dependency, Postural dysfunction, Improper body mechanics, Pain  Visit Diagnosis: Unsteadiness on feet  Other abnormalities of gait and mobility  Muscle weakness (generalized)     Problem List Patient Active Problem List   Diagnosis Date Noted  . Diabetic polyneuropathy associated with type 2 diabetes mellitus ([place]) 05/31/2016  . H/O amputation of leg through tibia and fibula, right ([place]) 05/03/2016  . Phantom limb pain ([place]) 03/06/2016  . Heat rash 03/06/2016  . Below knee amputation status ([place]) 02/29/2016  . SVT (supraventricular tachycardia) ([place]) 01/20/2016  . Chronic systolic heart failure ([place]) 01/20/2016  . Sepsis ([place]) 12/10/2015  . Diabetic foot ulcers ([place]) 07/12/2015  . Dysphagia 08/06/2014  . Anxiety state 10/26/2013  . PVC (premature ventricular contraction) 07/27/2011  . Diabetes mellitus type II, uncontrolled ([place]) 11/02/2010  . BASAL CELL CARCINOMA, FACE 10/18/2008  . BASAL CELL CARCINOMA, FACE 10/18/2008  . LIPOMA, SKIN 05/31/2008  . DRY SKIN 05/31/2008  . ERECTILE DYSFUNCTION  09/30/2007  . OSTEOARTHRITIS 08/08/2007  . Essential hypertension 03/24/2007    Willow Ora, PTA, [facility] [address] [place], [place] [address] [phone] 11/01/16, 9:53 PM   Name: ZIA KANNER MRN: [id number] Date of Birth: 11/11/1940

## 2016-11-02 ENCOUNTER — Encounter: Payer: Self-pay | Admitting: Physical Therapy

## 2016-11-02 ENCOUNTER — Ambulatory Visit: Payer: Medicare Other | Admitting: Physical Therapy

## 2016-11-02 DIAGNOSIS — R2689 Other abnormalities of gait and mobility: Secondary | ICD-10-CM

## 2016-11-02 DIAGNOSIS — R2681 Unsteadiness on feet: Secondary | ICD-10-CM | POA: Diagnosis not present

## 2016-11-02 DIAGNOSIS — M6249 Contracture of muscle, multiple sites: Secondary | ICD-10-CM | POA: Diagnosis not present

## 2016-11-02 DIAGNOSIS — M6281 Muscle weakness (generalized): Secondary | ICD-10-CM

## 2016-11-03 NOTE — Therapy (Signed)
[facility] [address] [place], [place], [address] Phone: [phone]   Fax:  [phone]  Physical Therapy Treatment  Patient Details  Name: [name] MRN: [id number] Date of Birth: 11-16-1940 Referring Provider: Meridee Score MD   Encounter Date: 11/02/2016      PT End of Session - 11/02/16 1541    Visit Number 16   Number of Visits 18   Date for PT Re-Evaluation 11/09/16   Authorization Type Medicare and G-codes    PT Start Time 1898   PT Stop Time 1615   PT Time Calculation (min) 40 min   Equipment Utilized During Treatment Gait belt   Activity Tolerance Patient tolerated treatment well   Behavior During Therapy [facility] for tasks assessed/performed      Past Medical History:  Diagnosis Date  . CAD (coronary artery disease)    Minimal plaque cath 2008  . Cataract    both  . Complication of anesthesia   . Diabetes mellitus   . ED (erectile dysfunction)   . Hypertension   . Osteoarthritis   . PONV (postoperative nausea and vomiting) 1960   with Ether  . Rheumatic fever   . Rhinitis     Past Surgical History:  Procedure Laterality Date  . AMPUTATION Right 02/29/2016   Procedure: Right Leg AMPUTATION BELOW KNEE with Wound Vac placement;  Surgeon: [name], MD;  Location: [facility];  Service: Orthopedics;  Laterality: Right;  . CHOLECYSTECTOMY  1983  . colonoscopy  2006, 2010  . INGUINAL HERNIA REPAIR Right 1960    There were no vitals filed for this visit.      Subjective Assessment - 11/02/16 1541    Subjective No new complaitns. No falls or pain to report.   Patient is accompained by: Family member   Pertinent History R transtibial amputation, DM2, HTN, B cataracts, diabetic polyneuropathy, CAD, SVT, CHF, basal cell cancer on his face, OA    Limitations Lifting;Standing;Walking;House hold activities   Patient Stated Goals 18 holes of golf (using cart), walk and get around independently, get  back on feet to take care of wife (wheelchair bound with PD)    Currently in Pain? No/denies   Pain Score 0-No pain            [facility] Adult PT Treatment/Exercise - 11/02/16 1542      Transfers   Transfers Sit to Stand;Stand to Sit   Sit to Stand 6: Modified independent (Device/Increase time);With upper extremity assist;From bed   Stand to Sit 6: Modified independent (Device/Increase time);With upper extremity assist;To bed     Ambulation/Gait   Ambulation/Gait Yes   Ambulation/Gait Assistance 6: Modified independent (Device/Increase time)   Ambulation/Gait Assistance Details mod I with cane on indoor level surfaces; min guard progressing to supervision with gait without AD while carrying a cup of water from back of gym to sink in ADL kitchen, back to corner of back of gym adn carrying plate with simlated food to sink counter in gym.                              Ambulation Distance (Feet) 120 Feet  x1 with cane; 250 x1 no AD    Assistive device Straight cane;Prosthesis;None   Gait Pattern Step-through pattern;Decreased step length - right;Decreased step length - left;Decreased stance time - right;Decreased stride length;Decreased weight shift to right   Ambulation Surface Level;Indoor   Ramp  Other (comment);5: Supervision  min guard x1, supervision x1   Curb Other (comment)  min guard x2 reps     High Level Balance   High Level Balance Activities Head turns;Figure 8 turns;Negotiating over obstacles;Negotitating around obstacles   High Level Balance Comments gait along ~50 foot hallway: forward gait with head movements left<>right and up<>down x 4 laps each with min guard assist with cane (pt carrying cane, not using it); gait without AD around gym while negotiating around furniture, tight spaces and floor surface changes with min guard assist downgrading to supervision; stepping over 2 bolsters then performing a figure 8 around hoola hoops on floor: no AD with pt going down and back x2  reps with min guard assist.                                  PT Short Term Goals - 10/05/16 1600      PT SHORT TERM GOAL #1   Title Patient will verbalize understanding of initial HEP. (TARGET DATE: 10/05/2016)   Baseline 10/02/16: met with current HEP (sink program for balance and proprioception   Status Achieved     PT SHORT TERM GOAL #2   Title Patient will report wearing prosthesis > or = 8 hrs/day without signs of skin integrity issues. (TARGET DATE: 10/05/2016)   Baseline 10/02/16: met- pt wearing 4-5 hours 2 x day most days, has occasional days of only 1 wear time   Status Achieved     PT SHORT TERM GOAL #3   Title Patient will demonsrate ability to ambulate with supervision with RW and prosthesis 300 feet including ramps and curbs.    Baseline 10/02/16: met today   Status Achieved     PT SHORT TERM GOAL #4   Title Patient will demonstrate ability to safely reach forward 3 inches in standing with prosthesis and supervision to indicate a decrease in his risk of falling. (TARGET DATE: 10/05/2016)   Baseline MET 10/05/2016   Time 4   Period Weeks   Status Achieved     PT SHORT TERM GOAL #5   Title Patient will independently demonstrate ability to don and doff prosthesis. (TARGET DATE: 10/05/2016)   Baseline 10/02/16: met today   Status Achieved           PT Long Term Goals - 10/10/16 1914      PT LONG TERM GOAL #1   Title Patient will verbalize understanding of ongoing HEP and fitness program. (TARGET DATE: 11/09/2016)   Time 8   Period Weeks   Status On-going     PT LONG TERM GOAL #2   Title Patient will report wearing prosthesis >90% of all waking hours without skin integrity issues to decrease his risk of falling. (TARGET DAT: 11/09/2016)    Time 8   Period Weeks   Status On-going     PT LONG TERM GOAL #3   Title Patient will have a gait speed of > or = 1.9f/s to indicate he is safe as a limited community ambulator. (TARGET DATE: 11/09/2016)    Time 8   Period  Weeks   Status On-going     PT LONG TERM GOAL #4   Title Patient will achieve a Berg Balance Test score of >45 to indicate a decreased risk of falling. (TARGET DATE: 11/09/2016)    Time 8   Period Weeks   Status On-going     PT  LONG TERM GOAL #5   Title Patient will demonstrate ability to ambulate 500 feet with LRAD and prosthesis and supervision over unlevel surfaces (grass, curbs, and ramps) to indicate being able to return to patient's goal of golfing. (TARGET DATE: 11/09/2016)   Time 8   Period Weeks   Status On-going     PT LONG TERM GOAL #6   Title Functional Gait Assessment with cane >/= 14/30 (TARGET DATE: 11/09/2016)   Time 8   Period Weeks   Status On-going     PT LONG TERM GOAL #7   Title Patient will verbalize understanding and demonstrate proper prosthetic care to demonstrate safety in using prosthesis. (TARGET DATE: 11/09/2016)   Time 8   Period Weeks   Status On-going            Plan - 11/02/16 1541    Clinical Impression Statement Today's skilled session focused on gait without AD while negotiating objects/furniture and carrying objects. No issues noted. Pt is progressing toward goals and should benefit from contineud PT to progress toward LTGs.   Rehab Potential Good   Clinical Impairments Affecting Rehab Potential DM2, HTN, B cataracts, diabetic polyneuropathy, CAD, SVT, CHF, basal cell cancer on face, OA    PT Frequency 2x / week   PT Duration Other (comment)   PT Treatment/Interventions ADLs/Self Care Home Management;DME Instruction;Gait training;Stair training;Functional mobility training;Therapeutic activities;Therapeutic exercise;Balance training;Prosthetic Training;Orthotic Fit/Training;Patient/family education;Neuromuscular re-education;Manual techniques   PT Next Visit Plan begin checking LTGs due 11/09/16   Consulted and Agree with Plan of Care Patient;Family member/caregiver   Family Member Consulted daughter      Patient will benefit from skilled  therapeutic intervention in order to improve the following deficits and impairments:  Abnormal gait, Decreased activity tolerance, Decreased balance, Decreased knowledge of precautions, Decreased endurance, Decreased knowledge of use of DME, Decreased mobility, Decreased range of motion, Decreased safety awareness, Difficulty walking, Decreased strength, Impaired sensation, Prosthetic Dependency, Postural dysfunction, Improper body mechanics, Pain  Visit Diagnosis: Unsteadiness on feet  Other abnormalities of gait and mobility  Muscle weakness (generalized)     Problem List Patient Active Problem List   Diagnosis Date Noted  . Diabetic polyneuropathy associated with type 2 diabetes mellitus ([place]) 05/31/2016  . H/O amputation of leg through tibia and fibula, right ([place]) 05/03/2016  . Phantom limb pain ([place]) 03/06/2016  . Heat rash 03/06/2016  . Below knee amputation status ([place]) 02/29/2016  . SVT (supraventricular tachycardia) ([place]) 01/20/2016  . Chronic systolic heart failure ([place]) 01/20/2016  . Sepsis ([place]) 12/10/2015  . Diabetic foot ulcers ([place]) 07/12/2015  . Dysphagia 08/06/2014  . Anxiety state 10/26/2013  . PVC (premature ventricular contraction) 07/27/2011  . Diabetes mellitus type II, uncontrolled ([place]) 11/02/2010  . BASAL CELL CARCINOMA, FACE 10/18/2008  . BASAL CELL CARCINOMA, FACE 10/18/2008  . LIPOMA, SKIN 05/31/2008  . DRY SKIN 05/31/2008  . ERECTILE DYSFUNCTION 09/30/2007  . OSTEOARTHRITIS 08/08/2007  . Essential hypertension 03/24/2007    Willow Ora, PTA, [facility] [address] [place], [place] [address] [phone] 11/03/16, 6:28 PM   Name: [name] MRN: [id number] Date of Birth: 10-23-1940

## 2016-11-05 ENCOUNTER — Encounter: Payer: Self-pay | Admitting: Physical Therapy

## 2016-11-05 DIAGNOSIS — E1136 Type 2 diabetes mellitus with diabetic cataract: Secondary | ICD-10-CM | POA: Diagnosis not present

## 2016-11-05 DIAGNOSIS — Z7984 Long term (current) use of oral hypoglycemic drugs: Secondary | ICD-10-CM | POA: Diagnosis not present

## 2016-11-05 DIAGNOSIS — Z89511 Acquired absence of right leg below knee: Secondary | ICD-10-CM | POA: Diagnosis not present

## 2016-11-05 DIAGNOSIS — I10 Essential (primary) hypertension: Secondary | ICD-10-CM | POA: Diagnosis not present

## 2016-11-05 DIAGNOSIS — E1140 Type 2 diabetes mellitus with diabetic neuropathy, unspecified: Secondary | ICD-10-CM | POA: Diagnosis not present

## 2016-11-08 ENCOUNTER — Encounter: Payer: Self-pay | Admitting: Physical Therapy

## 2016-11-09 ENCOUNTER — Ambulatory Visit: Payer: Medicare Other | Admitting: Physical Therapy

## 2016-11-09 ENCOUNTER — Encounter: Payer: Self-pay | Admitting: Physical Therapy

## 2016-11-09 DIAGNOSIS — M6249 Contracture of muscle, multiple sites: Secondary | ICD-10-CM | POA: Diagnosis not present

## 2016-11-09 DIAGNOSIS — R2689 Other abnormalities of gait and mobility: Secondary | ICD-10-CM

## 2016-11-09 DIAGNOSIS — R2681 Unsteadiness on feet: Secondary | ICD-10-CM

## 2016-11-09 DIAGNOSIS — M6281 Muscle weakness (generalized): Secondary | ICD-10-CM | POA: Diagnosis not present

## 2016-11-09 NOTE — Therapy (Signed)
[facility] [address] [place], [place], [address] Phone: [phone]   Fax:  [phone]  Physical Therapy Treatment  Patient Details  Name: [name] MRN: [id number] Date of Birth: November 09, 1940 Referring Provider: Meridee Score MD   Encounter Date: 11/09/2016      PT End of Session - 11/09/16 2052    Visit Number 17   Number of Visits 27  per recertification 1x/wk x 8 weeks   Date for PT Re-Evaluation 01/08/17  re-assess LTG at 4 weeks (12/07/16)   Authorization Type Medicare and G-codes    PT Start Time 8563   PT Stop Time 1618   PT Time Calculation (min) 40 min   Activity Tolerance Patient tolerated treatment well   Behavior During Therapy [facility] for tasks assessed/performed      Past Medical History:  Diagnosis Date  . CAD (coronary artery disease)    Minimal plaque cath 2008  . Cataract    both  . Complication of anesthesia   . Diabetes mellitus   . ED (erectile dysfunction)   . Hypertension   . Osteoarthritis   . PONV (postoperative nausea and vomiting) 1960   with Ether  . Rheumatic fever   . Rhinitis     Past Surgical History:  Procedure Laterality Date  . AMPUTATION Right 02/29/2016   Procedure: Right Leg AMPUTATION BELOW KNEE with Wound Vac placement;  Surgeon: [name], MD;  Location: [facility];  Service: Orthopedics;  Laterality: Right;  . CHOLECYSTECTOMY  1983  . colonoscopy  2006, 2010  . INGUINAL HERNIA REPAIR Right 1960    There were no vitals filed for this visit.      Subjective Assessment - 11/09/16 1542    Subjective Pt very frustrated today; went and had driving test with hand controls.  Did very well but has to wait until July 17th to receive the certification to have controls installed in car.  Pt is frustrated that he is having to pay extra and wait extra time for certification.   Patient is accompained by: Family member   Pertinent History R transtibial amputation, DM2,  HTN, B cataracts, diabetic polyneuropathy, CAD, SVT, CHF, basal cell cancer on his face, OA    Limitations Lifting;Standing;Walking;House hold activities   Patient Stated Goals 18 holes of golf (using cart), walk and get around independently, get back on feet to take care of wife (wheelchair bound with PD)    Currently in Pain? No/denies            [facility] PT Assessment - 11/09/16 1550      Standardized Balance Assessment   Standardized Balance Assessment Berg Balance Test;10 meter walk test   10 Meter Walk 1.78 ft/sec without SPC     Berg Balance Test   Sit to Stand Able to stand  independently using hands   Standing Unsupported Able to stand safely 2 minutes   Sitting with Back Unsupported but Feet Supported on Floor or Stool Able to sit safely and securely 2 minutes   Stand to Sit Controls descent by using hands   Transfers Able to transfer safely, minor use of hands   Standing Unsupported with Eyes Closed Able to stand 10 seconds with supervision   Standing Ubsupported with Feet Together Able to place feet together independently and stand for 1 minute with supervision   From Standing, Reach Forward with Outstretched Arm Can reach forward >12 cm safely (5")   From Standing Position, Pick up  Object from Floor Able to pick up shoe, needs supervision   From Standing Position, Turn to Look Behind Over each Shoulder Turn sideways only but maintains balance   Turn 360 Degrees Able to turn 360 degrees safely but slowly   Standing Unsupported, Alternately Place Feet on Step/Stool Able to complete >2 steps/needs minimal assist   Standing Unsupported, One Foot in Front Able to take small step independently and hold 30 seconds   Standing on One Leg Tries to lift leg/unable to hold 3 seconds but remains standing independently   Total Score 38   Berg comment: 38/56     Functional Gait  Assessment   Gait assessed  Yes   Gait Level Surface Walks 20 ft, slow speed, abnormal gait pattern,  evidence for imbalance or deviates 10-15 in outside of the 12 in walkway width. Requires more than 7 sec to ambulate 20 ft.   Change in Gait Speed Able to change speed, demonstrates mild gait deviations, deviates 6-10 in outside of the 12 in walkway width, or no gait deviations, unable to achieve a major change in velocity, or uses a change in velocity, or uses an assistive device.   Gait with Horizontal Head Turns Performs head turns smoothly with slight change in gait velocity (eg, minor disruption to smooth gait path), deviates 6-10 in outside 12 in walkway width, or uses an assistive device.   Gait with Vertical Head Turns Performs task with slight change in gait velocity (eg, minor disruption to smooth gait path), deviates 6 - 10 in outside 12 in walkway width or uses assistive device   Gait and Pivot Turn Pivot turns safely in greater than 3 sec and stops with no loss of balance, or pivot turns safely within 3 sec and stops with mild imbalance, requires small steps to catch balance.   Step Over Obstacle Is able to step over one shoe box (4.5 in total height) but must slow down and adjust steps to clear box safely. May require verbal cueing.   Gait with Narrow Base of Support Ambulates less than 4 steps heel to toe or cannot perform without assistance.   Gait with Eyes Closed Walks 20 ft, uses assistive device, slower speed, mild gait deviations, deviates 6-10 in outside 12 in walkway width. Ambulates 20 ft in less than 9 sec but greater than 7 sec.   Ambulating Backwards Walks 20 ft, uses assistive device, slower speed, mild gait deviations, deviates 6-10 in outside 12 in walkway width.   Steps Two feet to a stair, must use rail.   Total Score 15   FGA comment: 15/30       Patient demonstrates increased fall risk as noted by score of 38/56 on Berg Balance Scale.  (<36= high risk for falls, close to 100%; 37-45 significant >80%; 46-51 moderate >50%; 52-55 lower >25%)         PT Education -  11/09/16 2052    Education provided Yes   Education Details progress made, goals met and areas to continue to focus on in PT with recertification   Person(s) Educated Patient;Child(ren)   Methods Explanation   Comprehension Verbalized understanding          PT Short Term Goals - 10/05/16 1600      PT SHORT TERM GOAL #1   Title Patient will verbalize understanding of initial HEP. (TARGET DATE: 10/05/2016)   Baseline 10/02/16: met with current HEP (sink program for balance and proprioception   Status Achieved  PT SHORT TERM GOAL #2   Title Patient will report wearing prosthesis > or = 8 hrs/day without signs of skin integrity issues. (TARGET DATE: 10/05/2016)   Baseline 10/02/16: met- pt wearing 4-5 hours 2 x day most days, has occasional days of only 1 wear time   Status Achieved     PT SHORT TERM GOAL #3   Title Patient will demonsrate ability to ambulate with supervision with RW and prosthesis 300 feet including ramps and curbs.    Baseline 10/02/16: met today   Status Achieved     PT SHORT TERM GOAL #4   Title Patient will demonstrate ability to safely reach forward 3 inches in standing with prosthesis and supervision to indicate a decrease in his risk of falling. (TARGET DATE: 10/05/2016)   Baseline MET 10/05/2016   Time 4   Period Weeks   Status Achieved     PT SHORT TERM GOAL #5   Title Patient will independently demonstrate ability to don and doff prosthesis. (TARGET DATE: 10/05/2016)   Baseline 10/02/16: met today   Status Achieved           PT Long Term Goals - 11/09/16 1548      PT LONG TERM GOAL #1   Title Patient will verbalize understanding of ongoing HEP and fitness program. (NEW TARGET DATE FOR REMAINING LTG 12/07/16)   Time 8   Period Weeks   Status On-going     PT LONG TERM GOAL #2   Title Patient will report wearing prosthesis >90% of all waking hours without skin integrity issues to decrease his risk of falling. (TARGET DAT: 11/09/2016)    Baseline 100% of  waking hours   Time 8   Period Weeks   Status Achieved     PT LONG TERM GOAL #3   Title Patient will have a gait speed of > or = 1.7f/s to indicate he is safe as a limited community ambulator. (TARGET DATE: 11/09/2016)    Baseline 1.764fsec   Time 8   Period Weeks   Status Achieved     PT LONG TERM GOAL #4   Title Patient will achieve a Berg Balance Test score of >45 to indicate a decreased risk of falling. (NEW TARGET DATE FOR REMAINING LTG 12/07/16)   Baseline 38/56 on 6/15   Time 8   Period Weeks   Status On-going     PT LONG TERM GOAL #5   Title Patient will demonstrate ability to ambulate 500 feet with LRAD and prosthesis and supervision over unlevel surfaces (grass, curbs, and ramps) to indicate being able to return to patient's goal of golfing. (NEW TARGET DATE FOR REMAINING LTG 12/07/16)   Baseline supervision-min A   Time 8   Period Weeks   Status On-going     Additional Long Term Goals   Additional Long Term Goals Yes     PT LONG TERM GOAL #6   Title Functional Gait Assessment with cane >/= 14/30 (TARGET DATE: 11/09/2016)   Baseline 15/30 without cane   Time 8   Period Weeks   Status Achieved     PT LONG TERM GOAL #7   Title Patient will verbalize understanding and demonstrate proper prosthetic care to demonstrate safety in using prosthesis. (TARGET DATE: 11/09/2016)   Time 8   Period Weeks   Status Achieved     PT LONG TERM GOAL #8   Title Patient will have a gait speed of > or = 1.8066f to indicate he is  at decreased risk for falls during community ambulation. (NEW TARGET DATE FOR REMAINING LTG 12/07/16)   Baseline 1.73f/sec   Time 4   Period Weeks   Status New  11/09/16     PT LONG TERM GOAL  #9   TITLE Pt will ambulate 250' over level surfaces without cane Mod I and negotiate 4 stairs alternating sequence with rails Mod I(NEW TARGET DATE FOR REMAINING LTG 12/07/16)   Baseline step to sequence, 2 rails min A   Time 4   Period Weeks   Status New   11/09/16     PT LONG TERM GOAL  #10   TITLE Pt will improve FGA to > or = 19/30 to decrease risk for falls during dynamic gait. (NEW TARGET DATE FOR REMAINING LTG 12/07/16)   Baseline 15/30   Time 4   Period Weeks   Status New  11/09/16               Plan - 11/09/16 2056    Clinical Impression Statement Pt has met 4/7 LTG and has demonstrated improvements in prosthetic wear tolerance, strength, balance and prosthetic gait.  Pt continues to demonstrate increased falls risk when ambulating without AD, on compliant surfaces or when changing surfaces (curbs, stairs).  Pt's gait speed, FGA and BERG all indicate pt is at continued risk for falls and would benefit from ongoing PT services to maximize functional mobility independence and minimize falls risk.   Rehab Potential Good   Clinical Impairments Affecting Rehab Potential DM2, HTN, B cataracts, diabetic polyneuropathy, CAD, SVT, CHF, basal cell cancer on face, OA    PT Frequency 1x / week   PT Duration 8 weeks  will re-assess LTG at 4 weeks    PT Treatment/Interventions ADLs/Self Care Home Management;DME Instruction;Gait training;Stair training;Functional mobility training;Therapeutic activities;Therapeutic exercise;Balance training;Prosthetic Training;Orthotic Fit/Training;Patient/family education;Neuromuscular re-education;Manual techniques   PT Next Visit Plan Stair negotiation with alternating sequence, gait on compliant surfances, gait without AD on level surfaces   Consulted and Agree with Plan of Care Patient   Family Member Consulted daughter      Patient will benefit from skilled therapeutic intervention in order to improve the following deficits and impairments:  Abnormal gait, Decreased activity tolerance, Decreased balance, Decreased knowledge of precautions, Decreased endurance, Decreased knowledge of use of DME, Decreased mobility, Decreased range of motion, Decreased safety awareness, Difficulty walking, Decreased  strength, Impaired sensation, Prosthetic Dependency, Postural dysfunction, Improper body mechanics, Pain  Visit Diagnosis: Unsteadiness on feet  Other abnormalities of gait and mobility  Muscle weakness (generalized)  Contracture of muscle, multiple sites     Problem List Patient Active Problem List   Diagnosis Date Noted  . Diabetic polyneuropathy associated with type 2 diabetes mellitus ([facility] 05/31/2016  . H/O amputation of leg through tibia and fibula, right ([facility] 05/03/2016  . Phantom limb pain ([facility] 03/06/2016  . Heat rash 03/06/2016  . Below knee amputation status ([facility] 02/29/2016  . SVT (supraventricular tachycardia) ([facility] 01/20/2016  . Chronic systolic heart failure ([facility] 01/20/2016  . Sepsis ([facility] 12/10/2015  . Diabetic foot ulcers ([facility] 07/12/2015  . Dysphagia 08/06/2014  . Anxiety state 10/26/2013  . PVC (premature ventricular contraction) 07/27/2011  . Diabetes mellitus type II, uncontrolled ([facility] 11/02/2010  . BASAL CELL CARCINOMA, FACE 10/18/2008  . BASAL CELL CARCINOMA, FACE 10/18/2008  . LIPOMA, SKIN 05/31/2008  . DRY SKIN 05/31/2008  . ERECTILE DYSFUNCTION 09/30/2007  . OSTEOARTHRITIS 08/08/2007  . Essential hypertension 03/24/2007    ARaylene Everts PT, DPT  11/09/16    9:02 PM    [facility] [address] [place], [place], [address] Phone: [phone]   Fax:  [phone]  Name: [name] MRN: [id number] Date of Birth: 04/05/1941

## 2016-11-16 DIAGNOSIS — H5213 Myopia, bilateral: Secondary | ICD-10-CM | POA: Diagnosis not present

## 2016-11-16 DIAGNOSIS — H2513 Age-related nuclear cataract, bilateral: Secondary | ICD-10-CM | POA: Diagnosis not present

## 2016-11-16 DIAGNOSIS — E119 Type 2 diabetes mellitus without complications: Secondary | ICD-10-CM | POA: Diagnosis not present

## 2016-11-16 LAB — HM DIABETES EYE EXAM

## 2016-11-19 DIAGNOSIS — Z85828 Personal history of other malignant neoplasm of skin: Secondary | ICD-10-CM | POA: Diagnosis not present

## 2016-11-19 DIAGNOSIS — C44329 Squamous cell carcinoma of skin of other parts of face: Secondary | ICD-10-CM | POA: Diagnosis not present

## 2016-11-22 ENCOUNTER — Encounter: Payer: Self-pay | Admitting: Family Medicine

## 2016-12-03 ENCOUNTER — Ambulatory Visit (INDEPENDENT_AMBULATORY_CARE_PROVIDER_SITE_OTHER): Payer: Medicare Other | Admitting: Orthopedic Surgery

## 2016-12-04 ENCOUNTER — Ambulatory Visit: Payer: Medicare Other | Admitting: Physical Therapy

## 2016-12-04 DIAGNOSIS — H2511 Age-related nuclear cataract, right eye: Secondary | ICD-10-CM | POA: Diagnosis not present

## 2016-12-04 DIAGNOSIS — H25811 Combined forms of age-related cataract, right eye: Secondary | ICD-10-CM | POA: Diagnosis not present

## 2016-12-06 ENCOUNTER — Encounter (INDEPENDENT_AMBULATORY_CARE_PROVIDER_SITE_OTHER): Payer: Self-pay | Admitting: Orthopedic Surgery

## 2016-12-06 ENCOUNTER — Ambulatory Visit (INDEPENDENT_AMBULATORY_CARE_PROVIDER_SITE_OTHER): Payer: Medicare Other | Admitting: Orthopedic Surgery

## 2016-12-06 VITALS — Ht 72.0 in | Wt 188.0 lb

## 2016-12-06 DIAGNOSIS — Z89511 Acquired absence of right leg below knee: Secondary | ICD-10-CM

## 2016-12-06 NOTE — Progress Notes (Signed)
Office Visit Note   Patient: [name]           Date of Birth: [birth year]           MRN: [id number] Visit Date: 12/06/2016              Requested by: Dorena Cookey, MD [place], [place] [address] PCP: Dorena Cookey, MD  Chief Complaint  Patient presents with  . Right Leg - Follow-up    02/29/16 BKA      HPI: Patient presents follow-up status post right transtibial amputation. Patient almost a year out from surgery states she's noticed a new ulcer over the inferior pole of the patella.  Patient states that he has had a basal cell cancer removed and has had cataract surgery.  Assessment & Plan: Visit Diagnoses:  1. Status post below knee amputation of right lower extremity ([place])     Plan: Recommend following up with [employer] for padding around the socket to decrease the subsidence into the socket. Continue the Band-Aid over the ulcer.  Follow-Up Instructions: Return in about 4 weeks (around 01/03/2017).   Ortho Exam  Patient is alert, oriented, no adenopathy, well-dressed, normal affect, normal respiratory effort. Examination patient's leg is well consolidated he has lost some residual volume and now is sliding down into the socket. He has a small ulcer over the inferior pole the patella which is approximate 5 mm in diameter 0.1 mm deep there is no redness no cellulitis no signs of infection.  Imaging: No results found.  Labs: Lab Results  Component Value Date   HGBA1C 5.9 09/10/2016   HGBA1C 5.8 02/06/2016   HGBA1C 5.6 12/10/2015   ESRSEDRATE 25 (H) 12/10/2015   ESRSEDRATE 31 (H) 11/10/2013   CRP 0.8 12/10/2015   CRP 0.6 (H) 11/10/2013   REPTSTATUS 12/15/2015 FINAL 12/10/2015   CULT  12/10/2015    NO GROWTH 5 DAYS Performed at [facility] (A) 12/10/2015    Orders:  No orders of the defined types were placed in this encounter.  No orders of the defined types were placed in this encounter.    Procedures: No procedures performed  Clinical Data: No additional findings.  ROS:  All other systems negative, except as noted in the HPI. Review of Systems  Objective: Vital Signs: Ht 6' (1.829 m)   Wt 188 lb (85.3 kg)   BMI 25.50 kg/m   Specialty Comments:  No specialty comments available.  PMFS History: Patient Active Problem List   Diagnosis Date Noted  . Diabetic polyneuropathy associated with type 2 diabetes mellitus ([place]) 05/31/2016  . H/O amputation of leg through tibia and fibula, right ([place]) 05/03/2016  . Phantom limb pain ([place]) 03/06/2016  . Heat rash 03/06/2016  . Below knee amputation status ([place]) 02/29/2016  . SVT (supraventricular tachycardia) ([place]) 01/20/2016  . Chronic systolic heart failure ([place]) 01/20/2016  . Sepsis ([place]) 12/10/2015  . Diabetic foot ulcers ([place]) 07/12/2015  . Dysphagia 08/06/2014  . Anxiety state 10/26/2013  . PVC (premature ventricular contraction) 07/27/2011  . Diabetes mellitus type II, uncontrolled ([place]) 11/02/2010  . BASAL CELL CARCINOMA, FACE 10/18/2008  . BASAL CELL CARCINOMA, FACE 10/18/2008  . LIPOMA, SKIN 05/31/2008  . DRY SKIN 05/31/2008  . ERECTILE DYSFUNCTION 09/30/2007  . OSTEOARTHRITIS 08/08/2007  . Essential hypertension 03/24/2007   Past Medical History:  Diagnosis Date  . CAD (coronary artery disease)    Minimal plaque cath 2008  .  Cataract    both  . Complication of anesthesia   . Diabetes mellitus   . ED (erectile dysfunction)   . Hypertension   . Osteoarthritis   . PONV (postoperative nausea and vomiting) 1960   with Ether  . Rheumatic fever   . Rhinitis     Family History  Problem Relation Age of Onset  . Stroke Father 21       Died age 76  . Coronary artery disease Brother 15  . Colon cancer Neg Hx   . Colon polyps Neg Hx   . Diabetes Neg Hx   . Esophageal cancer Neg Hx   . Kidney disease Neg Hx   . Gallbladder disease Neg Hx     Past Surgical History:  Procedure Laterality Date  .  AMPUTATION Right 02/29/2016   Procedure: Right Leg AMPUTATION BELOW KNEE with Wound Vac placement;  Surgeon: [name], MD;  Location: [facility];  Service: Orthopedics;  Laterality: Right;  . CHOLECYSTECTOMY  1983  . colonoscopy  2006, 2010  . INGUINAL HERNIA REPAIR Right 1960   Social History   Occupational History  . 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 Smoking status: Former Smoker    Quit date: 07/26/1981  . Smokeless tobacco: Never Used  . Alcohol use 8.4 oz/week    14 Shots of liquor per week  . Drug use: No  . Sexual activity: No

## 2016-12-07 ENCOUNTER — Telehealth (INDEPENDENT_AMBULATORY_CARE_PROVIDER_SITE_OTHER): Payer: Self-pay | Admitting: Orthopedic Surgery

## 2016-12-07 NOTE — Telephone Encounter (Signed)
PT WOULD LIKE A CALL BACK REGARDING HAND CONTROLS HE IS OBTAINING FOR HIS VEHICLE. HE NEEDS SOMETHING STATING THAT HE DOES NEED THIS FOR THE DEALER HE IS OBTAINING THIS FROM.  [phone]

## 2016-12-07 NOTE — Telephone Encounter (Signed)
I called patient not available to speak. Advised we will be here on Monday. I'm not sure if there was something specific he was needing the letter to say in regards to the hand controls. Will follow up on Monday to discuss.

## 2016-12-10 NOTE — Telephone Encounter (Signed)
Patient did call back this afternoon. I spoke with him over the phone advising that Dr. Sharol Given is ok for hand control for automobile, letter of medical necessity written and rx written. Per patient this will save him over 100 dollars. Advised patient it will be available for pick up tomorrow morning.

## 2016-12-12 ENCOUNTER — Ambulatory Visit: Payer: Medicare Other | Admitting: Physical Therapy

## 2016-12-15 ENCOUNTER — Other Ambulatory Visit: Payer: Self-pay | Admitting: Family Medicine

## 2016-12-17 NOTE — Telephone Encounter (Signed)
Sent to the pharmacy by e-scribe. 

## 2016-12-19 ENCOUNTER — Ambulatory Visit: Payer: Medicare Other | Admitting: Physical Therapy

## 2016-12-21 ENCOUNTER — Encounter: Payer: Self-pay | Admitting: Physical Therapy

## 2016-12-21 ENCOUNTER — Ambulatory Visit: Payer: Medicare Other | Attending: Orthopedic Surgery | Admitting: Physical Therapy

## 2016-12-21 ENCOUNTER — Telehealth: Payer: Self-pay | Admitting: Internal Medicine

## 2016-12-21 DIAGNOSIS — R2681 Unsteadiness on feet: Secondary | ICD-10-CM | POA: Insufficient documentation

## 2016-12-21 DIAGNOSIS — M6281 Muscle weakness (generalized): Secondary | ICD-10-CM | POA: Diagnosis not present

## 2016-12-21 DIAGNOSIS — R2689 Other abnormalities of gait and mobility: Secondary | ICD-10-CM | POA: Insufficient documentation

## 2016-12-21 NOTE — Telephone Encounter (Signed)
Ptt is calling back,said he received a call this morning while he was driving and could not talked. He does not know who called.He wants you to call him please.

## 2016-12-21 NOTE — Telephone Encounter (Signed)
Contacted patient and informed him I had no record of who may have tried to reach him, but scheduled patient for 1 year visit w/Dr. Debara Pickett in Sept.

## 2016-12-22 NOTE — Therapy (Signed)
[facility] [address] [place], [place], [address] Phone: [phone]   Fax:  [phone]  Physical Therapy Treatment  Patient Details  Name: [name] MRN: [id number] Date of Birth: 05-Nov-1940 Referring Provider: Meridee Score MD   Encounter Date: 12/21/2016      PT End of Session - 12/21/16 1534    Visit Number 18   Number of Visits 27  per recertification 1x/wk x 8 weeks   Date for PT Re-Evaluation 01/08/17   Authorization Type Medicare and G-codes    PT Start Time 0803   PT Stop Time 0850   PT Time Calculation (min) 47 min   Activity Tolerance Patient tolerated treatment well   Behavior During Therapy [facility] for tasks assessed/performed      Past Medical History:  Diagnosis Date  . CAD (coronary artery disease)    Minimal plaque cath 08/18/2006  . Cataract    both  . Complication of anesthesia   . Diabetes mellitus   . ED (erectile dysfunction)   . Hypertension   . Osteoarthritis   . PONV (postoperative nausea and vomiting) 1960   with Ether  . Rheumatic fever   . Rhinitis     Past Surgical History:  Procedure Laterality Date  . AMPUTATION Right 02/29/2016   Procedure: Right Leg AMPUTATION BELOW KNEE with Wound Vac placement;  Surgeon: [name], MD;  Location: [facility];  Service: Orthopedics;  Laterality: Right;  . CHOLECYSTECTOMY  1983  . colonoscopy  2006, 2010  . INGUINAL HERNIA REPAIR Right 1960    There were no vitals filed for this visit.      Subjective Assessment - 12/21/16 0812    Subjective Pt returns today after being on hold x 1 month due to multiple surgeries for skin cancer and cataracts.  Pt was able to get certified for hand controls and has been driving since Aug 18, 2022.  Reports his low back has been bothering him but feels it is due to inactivity-plans to go back to the [facility] on Monday.  Has questions about specific prosthesis and adaptive golf.  Also asking questions about using  crutches instead of w/c if he has to leave his prosthesis for adjustments.   Pertinent History R transtibial amputation, DM2, HTN, B cataracts, diabetic polyneuropathy, CAD, SVT, CHF, basal cell cancer on his face, OA    Limitations Lifting;Standing;Walking;House hold activities   Patient Stated Goals 18 holes of golf (using cart), walk and get around independently, get back on feet to take care of wife (wheelchair bound with PD)    Currently in Pain? Yes            [facility] PT Assessment - 12/21/16 0823      Standardized Balance Assessment   Standardized Balance Assessment Berg Balance Test;10 meter walk test   10 Meter Walk 16.25 or 2.01 ft/sec     Berg Balance Test   Sit to Stand Able to stand  independently using hands   Standing Unsupported Able to stand safely 2 minutes   Sitting with Back Unsupported but Feet Supported on Floor or Stool Able to sit safely and securely 2 minutes   Stand to Sit Controls descent by using hands   Transfers Able to transfer safely, minor use of hands   Standing Unsupported with Eyes Closed Able to stand 10 seconds safely   Standing Ubsupported with Feet Together Able to place feet together independently and stand 1 minute safely   From Standing,  Reach Forward with Outstretched Arm Can reach confidently >25 cm (10")   From Standing Position, Pick up Object from Floor Able to pick up shoe, needs supervision   From Standing Position, Turn to Look Behind Over each Shoulder Looks behind one side only/other side shows less weight shift   Turn 360 Degrees Able to turn 360 degrees safely but slowly   Standing Unsupported, Alternately Place Feet on Step/Stool Able to complete >2 steps/needs minimal assist   Standing Unsupported, One Foot in Front Able to take small step independently and hold 30 seconds   Standing on One Leg Tries to lift leg/unable to hold 3 seconds but remains standing independently   Total Score 42   Berg comment: 42/56     Functional  Gait  Assessment   Gait assessed  Yes   Gait Level Surface Walks 20 ft, slow speed, abnormal gait pattern, evidence for imbalance or deviates 10-15 in outside of the 12 in walkway width. Requires more than 7 sec to ambulate 20 ft.   Change in Gait Speed Able to change speed, demonstrates mild gait deviations, deviates 6-10 in outside of the 12 in walkway width, or no gait deviations, unable to achieve a major change in velocity, or uses a change in velocity, or uses an assistive device.   Gait with Horizontal Head Turns Performs head turns smoothly with slight change in gait velocity (eg, minor disruption to smooth gait path), deviates 6-10 in outside 12 in walkway width, or uses an assistive device.   Gait with Vertical Head Turns Performs task with slight change in gait velocity (eg, minor disruption to smooth gait path), deviates 6 - 10 in outside 12 in walkway width or uses assistive device   Gait and Pivot Turn Pivot turns safely in greater than 3 sec and stops with no loss of balance, or pivot turns safely within 3 sec and stops with mild imbalance, requires small steps to catch balance.   Step Over Obstacle Is able to step over one shoe box (4.5 in total height) without changing gait speed. No evidence of imbalance.   Gait with Narrow Base of Support Ambulates less than 4 steps heel to toe or cannot perform without assistance.   Gait with Eyes Closed Walks 20 ft, slow speed, abnormal gait pattern, evidence for imbalance, deviates 10-15 in outside 12 in walkway width. Requires more than 9 sec to ambulate 20 ft.   Ambulating Backwards Walks 20 ft, uses assistive device, slower speed, mild gait deviations, deviates 6-10 in outside 12 in walkway width.   Steps Two feet to a stair, must use rail.   Total Score 15   FGA comment: 15/30         Prosthetics Assessment - 12/21/16 0818      Prosthetics   Prosthetic Care Independent with Skin check;Prosthetic cleaning;Ply sock cleaning;Correct ply  sock adjustment;Proper wear schedule/adjustment;Residual limb care   Prosthetic Care Comments  since pads place in socket increased pressure on patella noted; wearing 3 ply sock consistently   Donning prosthesis  Independent   Doffing prosthesis  Independent   Current prosthetic wear tolerance (days/week)  daily   Current prosthetic wear tolerance (#hours/day)  all waking hours   Residual limb condition  healing ulcer on tibial tuberosity-keeps covered with bandaid; provided pt with tegaderm to place over ulcer to maintain visualization of ulcer  PT Education - 12/22/16 1533    Education provided Yes   Education Details progress towards goals, deferred questions about adaptive golf and use of crutches to next session with primary PT.  Educated pt on how type of prosthesis is chosen for individual patients   Person(s) Educated Patient   Methods Explanation   Comprehension Verbalized understanding          PT Short Term Goals - 10/05/16 1600      PT SHORT TERM GOAL #1   Title Patient will verbalize understanding of initial HEP. (TARGET DATE: 10/05/2016)   Baseline 10/02/16: met with current HEP (sink program for balance and proprioception   Status Achieved     PT SHORT TERM GOAL #2   Title Patient will report wearing prosthesis > or = 8 hrs/day without signs of skin integrity issues. (TARGET DATE: 10/05/2016)   Baseline 10/02/16: met- pt wearing 4-5 hours 2 x day most days, has occasional days of only 1 wear time   Status Achieved     PT SHORT TERM GOAL #3   Title Patient will demonsrate ability to ambulate with supervision with RW and prosthesis 300 feet including ramps and curbs.    Baseline 10/02/16: met today   Status Achieved     PT SHORT TERM GOAL #4   Title Patient will demonstrate ability to safely reach forward 3 inches in standing with prosthesis and supervision to indicate a decrease in his risk of falling. (TARGET DATE: 10/05/2016)    Baseline MET 10/05/2016   Time 4   Period Weeks   Status Achieved     PT SHORT TERM GOAL #5   Title Patient will independently demonstrate ability to don and doff prosthesis. (TARGET DATE: 10/05/2016)   Baseline 10/02/16: met today   Status Achieved           PT Long Term Goals - 12/21/16 1538      PT LONG TERM GOAL #1   Title Patient will verbalize understanding of ongoing HEP and fitness program.   Time 8   Period Weeks   Status On-going   Target Date 01/08/17     PT LONG TERM GOAL #2   Title Patient will report wearing prosthesis >90% of all waking hours without skin integrity issues to decrease his risk of falling. (TARGET DAT: 11/09/2016)    Baseline 100% of waking hours   Time --   Period --   Status Achieved     PT LONG TERM GOAL #3   Title Patient will have a gait speed of > or = 1.72f/s to indicate he is safe as a limited community ambulator. (TARGET DATE: 11/09/2016)    Baseline 1.77fsec; 2.01 on 12/21/16   Time --   Period --   Status Achieved     PT LONG TERM GOAL #4   Title Patient will achieve a Berg Balance Test score of >45 to indicate a decreased risk of falling.   Baseline 38/56 on 6/15; 42/56 on 7/28-ongoing   Time 8   Period Weeks   Status On-going   Target Date 01/08/17     PT LONG TERM GOAL #5   Title Patient will demonstrate ability to ambulate 500 feet with LRAD and prosthesis and supervision over unlevel surfaces (grass, curbs, and ramps) to indicate being able to return to patient's goal of golfing.   Baseline supervision-min A   Time 8   Period Weeks   Status On-going   Target Date 01/08/17  PT LONG TERM GOAL #6   Title Functional Gait Assessment with cane >/= 14/30 (TARGET DATE: 11/09/2016)   Baseline 15/30 without cane   Time --   Period --   Status Achieved     PT LONG TERM GOAL #7   Title Patient will verbalize understanding and demonstrate proper prosthetic care to demonstrate safety in using prosthesis. (TARGET DATE:  11/09/2016)   Time --   Period --   Status Achieved     PT LONG TERM GOAL #8   Title Patient will have a gait speed of > or = 1.56f/s to indicate he is at decreased risk for falls during community ambulation. (NEW TARGET DATE FOR REMAINING LTG 12/07/16)   Baseline 2.01 ft/sec on 12/21/16   Time --   Period --   Status Achieved     PT LONG TERM GOAL  #9   TITLE Pt will ambulate 250' over level surfaces without cane Mod I and negotiate 4 stairs alternating sequence with rails Mod I(NEW TARGET DATE FOR REMAINING LTG 12/07/16)   Baseline step to sequence, 2 rails min A   Time 4   Period Weeks   Status On-going     PT LONG TERM GOAL  #10   TITLE Pt will improve FGA to > or = 19/30 to decrease risk for falls during dynamic gait. (NEW TARGET DATE FOR REMAINING LTG 12/07/16)   Baseline 15/30 without cane on 12/21/16   Time 4   Period Weeks   Status On-going               Plan - 12/21/16 1535    Clinical Impression Statement Pt returns after one month of being on hold due to multiple surgeries and his wife having medical complications.  Despite one month away from therapy pt did not show a decline in function or progress made with therapy.  Pt's gait velocity has improved to 2.034fsec and his FGA score remained the same even with pt performing without cane today.  He continues to have difficulty negotiating stairs with alternating sequence, difficulty with compliant surfaces, endurance and higher level balance challenges as indicated on BERG balance and FGA.  Will continue to address and will continue to see pt through end of certification period.   Rehab Potential Good   Clinical Impairments Affecting Rehab Potential DM2, HTN, B cataracts, diabetic polyneuropathy, CAD, SVT, CHF, basal cell cancer on face, OA    PT Frequency 1x / week   PT Duration 8 weeks  will re-assess LTG at 4 weeks    PT Treatment/Interventions ADLs/Self Care Home Management;DME Instruction;Gait training;Stair  training;Functional mobility training;Therapeutic activities;Therapeutic exercise;Balance training;Prosthetic Training;Orthotic Fit/Training;Patient/family education;Neuromuscular re-education;Manual techniques   PT Next Visit Plan G CODE DUE IN 2 VISITS.  Pt has questions about adaptive golf and current prosthesis; also asking about trying crutches-he is worried he will have to leave his prosthesis with prosthetist to get work done and he doesnt want to go back to using w/c now that he is driving.  Stair negotiation with alternating sequence, gait on compliant surfances, gait without AD on level surfaces   Consulted and Agree with Plan of Care Patient      Patient will benefit from skilled therapeutic intervention in order to improve the following deficits and impairments:  Abnormal gait, Decreased activity tolerance, Decreased balance, Decreased knowledge of precautions, Decreased endurance, Decreased knowledge of use of DME, Decreased mobility, Decreased range of motion, Decreased safety awareness, Difficulty walking, Decreased strength, Impaired sensation, Prosthetic Dependency,  Postural dysfunction, Improper body mechanics, Pain  Visit Diagnosis: Unsteadiness on feet  Other abnormalities of gait and mobility  Muscle weakness (generalized)     Problem List Patient Active Problem List   Diagnosis Date Noted  . Diabetic polyneuropathy associated with type 2 diabetes mellitus ([place]) 05/31/2016  . H/O amputation of leg through tibia and fibula, right ([place]) 05/03/2016  . Phantom limb pain ([place]) 03/06/2016  . Heat rash 03/06/2016  . Below knee amputation status ([place]) 02/29/2016  . SVT (supraventricular tachycardia) ([place]) 01/20/2016  . Chronic systolic heart failure ([place]) 01/20/2016  . Sepsis ([place]) 12/10/2015  . Diabetic foot ulcers ([place]) 07/12/2015  . Dysphagia 08/06/2014  . Anxiety state 10/26/2013  . PVC (premature ventricular contraction) 07/27/2011  . Diabetes mellitus type II,  uncontrolled ([place]) 11/02/2010  . BASAL CELL CARCINOMA, FACE 10/18/2008  . BASAL CELL CARCINOMA, FACE 10/18/2008  . LIPOMA, SKIN 05/31/2008  . DRY SKIN 05/31/2008  . ERECTILE DYSFUNCTION 09/30/2007  . OSTEOARTHRITIS 08/08/2007  . Essential hypertension 03/24/2007    Raylene Everts, PT, DPT 12/22/16    3:47 PM    [facility] [address] [place], [place], [address] Phone: [phone]   Fax:  [phone]  Name: [name] MRN: [id number] Date of Birth: 22-Apr-1941

## 2016-12-25 ENCOUNTER — Ambulatory Visit: Payer: Medicare Other | Admitting: Physical Therapy

## 2016-12-27 ENCOUNTER — Ambulatory Visit: Payer: Medicare Other | Admitting: Physical Therapy

## 2017-01-02 ENCOUNTER — Other Ambulatory Visit: Payer: Self-pay | Admitting: Family Medicine

## 2017-01-02 ENCOUNTER — Ambulatory Visit: Payer: Medicare Other | Attending: Orthopedic Surgery | Admitting: Physical Therapy

## 2017-01-02 ENCOUNTER — Encounter: Payer: Self-pay | Admitting: Physical Therapy

## 2017-01-02 DIAGNOSIS — R2681 Unsteadiness on feet: Secondary | ICD-10-CM

## 2017-01-02 DIAGNOSIS — M6249 Contracture of muscle, multiple sites: Secondary | ICD-10-CM

## 2017-01-02 DIAGNOSIS — R2689 Other abnormalities of gait and mobility: Secondary | ICD-10-CM | POA: Diagnosis not present

## 2017-01-02 DIAGNOSIS — M6281 Muscle weakness (generalized): Secondary | ICD-10-CM | POA: Diagnosis not present

## 2017-01-02 NOTE — Patient Instructions (Signed)
Pelvic Tilt    Flatten back by tightening stomach muscles and buttocks and arch your back by rotating pelvis back and then forwards. Repeat 10 times per set.  Do 2 sessions per day.  http://orth.exer.us/134   Copyright  VHI. All rights reserved.  Knee to Chest (Flexion)    Pull knee toward chest. Feel stretch in lower back or buttock area. Breathing deeply, Hold 6 seconds. Repeat with other knee. Repeat 10 times. Do 2 sessions per day.  http://gt2.exer.us/225   Copyright  VHI. All rights reserved.           Double Knee to Chest (Flexion)    Using your hands, grab one knee then the other, pulling both knees up toward your chest gently. Feel stretch in lower back or buttock area. Breathing deeply, Hold 10 seconds and return one leg at a time back to resting position. Repeat 5 times. Do 2 sessions per day.  http://gt2.exer.us/227   Copyright  VHI. All rights reserved.  Lumbar Rotation: Caudal - Bilateral (Supine)    Feet and knees together, arms outstretched, rotate knees left, turning head in opposite direction, until stretch is felt. Hold 2-3 seconds. Relax. Repeat 10 times per set.  Do 2 sessions per day.  http://orth.exer.us/1020   Hamstring Stretch    FOR LEFT LEG: With other leg bent, foot flat, grasp right leg and slowly try to straighten knee. Hold _30___ seconds. Repeat __2__ times. Do _2___ sessions per day.  http://gt2.exer.us/279   Copyright  VHI. All rights reserved.  Hamstring Stretch    FOR RIGHT LEG: Place sound foot on the floor, keep back and residual limb knee straight. Lean forward until a stretch is felt in back of residual limb thigh. Hold __30__ seconds. Repeat __2__ times. Do _2___ sessions per day.  Copyright  VHI. All rights reserved.

## 2017-01-02 NOTE — Therapy (Addendum)
[facility] [address] [place], [place], [address] Phone: [phone]   Fax:  [phone]  Physical Therapy Treatment  Patient Details  Name: [name] MRN: [id number] Date of Birth: [birth year] Referring Provider: Meridee Score MD   Encounter Date: 01/02/2017      PT End of Session - 01/02/17 1249    Visit Number 19   Number of Visits 27  per recertification 1x/wk x 8 weeks   Date for PT Re-Evaluation 01/08/17   Authorization Type Medicare and G-codes    PT Start Time 0931   PT Stop Time 1022   PT Time Calculation (min) 51 min   Activity Tolerance Patient limited by pain   Behavior During Therapy [facility] for tasks assessed/performed      Past Medical History:  Diagnosis Date  . CAD (coronary artery disease)    Minimal plaque cath 2006/07/25  . Cataract    both  . Complication of anesthesia   . Diabetes mellitus   . ED (erectile dysfunction)   . Hypertension   . Osteoarthritis   . PONV (postoperative nausea and vomiting) 1960   with Ether  . Rheumatic fever   . Rhinitis     Past Surgical History:  Procedure Laterality Date  . AMPUTATION Right 02/29/2016   Procedure: Right Leg AMPUTATION BELOW KNEE with Wound Vac placement;  Surgeon: [name], MD;  Location: [facility];  Service: Orthopedics;  Laterality: Right;  . CHOLECYSTECTOMY  1983  . colonoscopy  2006, 2010  . INGUINAL HERNIA REPAIR Right 1960    There were no vitals filed for this visit.      Subjective Assessment - 01/02/17 0943    Subjective Pt returns today after having to cancel a few visits due to his wife passing away and taking care of arrangements.  Reports that his back continues to bother him, worse at night and is not sleeping well.  Had to use RW today.  Ulcer is smaller on residual limb but not fully healed.  Has appointment with Dr. Sharol Given tomorrow; does not wish to practice crutches today due to back pain.   Patient is accompained by:  Family member   Pertinent History R transtibial amputation, DM2, HTN, B cataracts, diabetic polyneuropathy, CAD, SVT, CHF, basal cell cancer on his face, OA    Limitations Lifting;Standing;Walking;House hold activities   Patient Stated Goals 18 holes of golf (using cart), walk and get around independently, get back on feet to take care of wife (wheelchair bound with PD)    Currently in Pain? Yes   Pain Score 2    Pain Location Back   Pain Orientation Mid;Lower   Pain Descriptors / Indicators Aching;Discomfort   Pain Type Acute pain   Aggravating Factors  worse at night; not sleeping well   Pain Relieving Factors medication, using heating pad      Pelvic Tilt    Flatten back by tightening stomach muscles and buttocks and arch your back by rotating pelvis back and then forwards. Repeat 10 times per set.  Do 2 sessions per day.  http://orth.exer.us/134   Copyright  VHI. All rights reserved.  Knee to Chest (Flexion)    Pull knee toward chest. Feel stretch in lower back or buttock area. Breathing deeply, Hold 6 seconds. Repeat with other knee. Repeat 10 times. Do 2 sessions per day.  http://gt2.exer.us/225   Copyright  VHI. All rights reserved.           Double  Knee to Chest (Flexion)    Using your hands, grab one knee then the other, pulling both knees up toward your chest gently. Feel stretch in lower back or buttock area. Breathing deeply, Hold 10 seconds and return one leg at a time back to resting position. Repeat 5 times. Do 2 sessions per day.  http://gt2.exer.us/227   Copyright  VHI. All rights reserved.  Lumbar Rotation: Caudal - Bilateral (Supine)    Feet and knees together, arms outstretched, rotate knees left, turning head in opposite direction, until stretch is felt. Hold 2-3 seconds. Relax. Repeat 10 times per set.  Do 2 sessions per day.  http://orth.exer.us/1020   Hamstring Stretch    FOR LEFT LEG: With other leg bent, foot flat, grasp  right leg and slowly try to straighten knee. Hold _30___ seconds. Repeat __2__ times. Do _2___ sessions per day.  http://gt2.exer.us/279   Copyright  VHI. All rights reserved.  Hamstring Stretch    FOR RIGHT LEG: Place sound foot on the floor, keep back and residual limb knee straight. Lean forward until a stretch is felt in back of residual limb thigh. Hold __30__ seconds. Repeat __2__ times. Do _2___ sessions per day.  Copyright  VHI. All rights reserved.           [facility] Adult PT Treatment/Exercise - 01/02/17 1243      Posture/Postural Control   Posture Comments In standing pt noted to have R hip drop and L pelvic elevation; when pt levels out pelvis RLE is off of the ground which may be contributing to his low back pain; pt to discuss with Dr. Sharol Given tomorrow.       Manual Therapy   Manual Therapy Joint mobilization   Joint Mobilization Performed hip joint mobilization anterior-posterior with hip in flexion and slight internal rotation and ADD due to hip joint tightness into ER      Following PT session this PT contacted pt's prosthetist to discuss leg length discrepancy; [employer] to contact pt and have pt come in to office tomorrow to assess fit of socket vs. Length of prosthesis.              PT Education - 01/02/17 1246    Education provided Yes   Education Details low back stretches for back pain; pt to discuss leg length discrepency with Dr. Sharol Given tomorrow as it may be a contributing factor in back pain.   Person(s) Educated Patient   Methods Explanation;Demonstration;Handout   Comprehension Verbalized understanding;Returned demonstration          PT Short Term Goals - 10/05/16 1600      PT SHORT TERM GOAL #1   Title Patient will verbalize understanding of initial HEP. (TARGET DATE: 10/05/2016)   Baseline 10/02/16: met with current HEP (sink program for balance and proprioception   Status Achieved     PT SHORT TERM GOAL #2   Title Patient will report  wearing prosthesis > or = 8 hrs/day without signs of skin integrity issues. (TARGET DATE: 10/05/2016)   Baseline 10/02/16: met- pt wearing 4-5 hours 2 x day most days, has occasional days of only 1 wear time   Status Achieved     PT SHORT TERM GOAL #3   Title Patient will demonsrate ability to ambulate with supervision with RW and prosthesis 300 feet including ramps and curbs.    Baseline 10/02/16: met today   Status Achieved     PT SHORT TERM GOAL #4   Title Patient will demonstrate ability to  safely reach forward 3 inches in standing with prosthesis and supervision to indicate a decrease in his risk of falling. (TARGET DATE: 10/05/2016)   Baseline MET 10/05/2016   Time 4   Period Weeks   Status Achieved     PT SHORT TERM GOAL #5   Title Patient will independently demonstrate ability to don and doff prosthesis. (TARGET DATE: 10/05/2016)   Baseline 10/02/16: met today   Status Achieved           PT Long Term Goals - 01/02/17 1251      PT LONG TERM GOAL #1   Title Patient will verbalize understanding of ongoing HEP and fitness program.   Status On-going   Target Date 01/08/17     PT LONG TERM GOAL #4   Title Patient will achieve a Berg Balance Test score of >45 to indicate a decreased risk of falling.   Baseline 38/56 on 6/15; 42/56 on 7/28-ongoing   Status On-going   Target Date 01/08/17     PT LONG TERM GOAL #5   Title Patient will demonstrate ability to ambulate 500 feet with LRAD and prosthesis and supervision over unlevel surfaces (grass, curbs, and ramps) to indicate being able to return to patient's goal of golfing.   Baseline supervision-min A   Status On-going   Target Date 01/08/17     PT LONG TERM GOAL  #9   TITLE Pt will ambulate 250' over level surfaces without cane Mod I and negotiate 4 stairs alternating sequence with rails Mod I(NEW TARGET DATE FOR REMAINING LTG 01/08/17)   Baseline step to sequence, 2 rails min A   Status On-going     PT LONG TERM GOAL  #10    TITLE Pt will improve FGA to > or = 19/30 to decrease risk for falls during dynamic gait. (NEW TARGET DATE FOR REMAINING LTG 8/14//18)   Baseline 15/30 without cane on 12/21/16   Status On-going               Plan - 01/02/17 1252    Clinical Impression Statement Pt continues to miss multiple sessions due to wife passing away and having to make arrangement.  Pt returns today with continued and worsening back pain.  Focused treatment session today on exercises for hip and low back ROM and postural assessment in standing.  Leg length discrepency may be contributing to low back pain and encouraged pt to discuss with surgeon tomorrow.  Pt reporting mild improvement in pain at end of session.     Rehab Potential Good   Clinical Impairments Affecting Rehab Potential DM2, HTN, B cataracts, diabetic polyneuropathy, CAD, SVT, CHF, basal cell cancer on face, OA    PT Frequency 1x / week   PT Duration 8 weeks  will re-assess LTG at 4 weeks    PT Treatment/Interventions ADLs/Self Care Home Management;DME Instruction;Gait training;Stair training;Functional mobility training;Therapeutic activities;Therapeutic exercise;Balance training;Prosthetic Training;Orthotic Fit/Training;Patient/family education;Neuromuscular re-education;Manual techniques   PT Next Visit Plan G CODE DUE IN 1 VISIT; REASSESS REMAINING LTG AND RECERTIFY IF PT AGREEABLE.  Pt has questions about adaptive golf and current prosthesis; also asking about trying crutches-he is worried he will have to leave his prosthesis with prosthetist to get work done and he doesnt want to go back to using w/c now that he is driving.  Stair negotiation with alternating sequence, gait on compliant surfances, gait without AD on level surfaces   Consulted and Agree with Plan of Care Patient      Patient  will benefit from skilled therapeutic intervention in order to improve the following deficits and impairments:  Abnormal gait, Decreased activity tolerance,  Decreased balance, Decreased knowledge of precautions, Decreased endurance, Decreased knowledge of use of DME, Decreased mobility, Decreased range of motion, Decreased safety awareness, Difficulty walking, Decreased strength, Impaired sensation, Prosthetic Dependency, Postural dysfunction, Improper body mechanics, Pain  Visit Diagnosis: Unsteadiness on feet  Other abnormalities of gait and mobility  Muscle weakness (generalized)  Contracture of muscle, multiple sites     Problem List Patient Active Problem List   Diagnosis Date Noted  . Diabetic polyneuropathy associated with type 2 diabetes mellitus ([place]) 05/31/2016  . H/O amputation of leg through tibia and fibula, right ([place]) 05/03/2016  . Phantom limb pain ([place]) 03/06/2016  . Heat rash 03/06/2016  . Below knee amputation status ([place]) 02/29/2016  . SVT (supraventricular tachycardia) ([place]) 01/20/2016  . Chronic systolic heart failure ([place]) 01/20/2016  . Sepsis ([place]) 12/10/2015  . Diabetic foot ulcers ([place]) 07/12/2015  . Dysphagia 08/06/2014  . Anxiety state 10/26/2013  . PVC (premature ventricular contraction) 07/27/2011  . Diabetes mellitus type II, uncontrolled ([place]) 11/02/2010  . BASAL CELL CARCINOMA, FACE 10/18/2008  . BASAL CELL CARCINOMA, FACE 10/18/2008  . LIPOMA, SKIN 05/31/2008  . DRY SKIN 05/31/2008  . ERECTILE DYSFUNCTION 09/30/2007  . OSTEOARTHRITIS 08/08/2007  . Essential hypertension 03/24/2007    Raylene Everts, PT, DPT 01/02/17    12:58 PM    [facility] [address] [place], [place], [address] Phone: [phone]   Fax:  [phone]  Name: [name] MRN: [id number] Date of Birth: [birth year]

## 2017-01-03 ENCOUNTER — Encounter (INDEPENDENT_AMBULATORY_CARE_PROVIDER_SITE_OTHER): Payer: Self-pay | Admitting: Orthopedic Surgery

## 2017-01-03 ENCOUNTER — Ambulatory Visit (INDEPENDENT_AMBULATORY_CARE_PROVIDER_SITE_OTHER): Payer: Medicare Other | Admitting: Orthopedic Surgery

## 2017-01-03 VITALS — Ht 72.0 in | Wt 188.0 lb

## 2017-01-03 DIAGNOSIS — M545 Low back pain: Secondary | ICD-10-CM

## 2017-01-03 DIAGNOSIS — G8929 Other chronic pain: Secondary | ICD-10-CM | POA: Insufficient documentation

## 2017-01-03 MED ORDER — TRAMADOL HCL 50 MG PO TABS: 50.0000 mg | ORAL_TABLET | Freq: Four times a day (QID) | ORAL | 0 refills | Status: DC | PRN

## 2017-01-03 MED ORDER — METHOCARBAMOL 500 MG PO TABS: 500.0000 mg | ORAL_TABLET | Freq: Four times a day (QID) | ORAL | 0 refills | Status: DC | PRN

## 2017-01-03 NOTE — Progress Notes (Signed)
Office Visit Note   Patient: [name]           Date of Birth: 1940/09/09           MRN: [id number] Visit Date: 01/03/2017              Requested by: Dorena Cookey, MD [place], [place] [address] PCP: Dorena Cookey, MD  Chief Complaint  Patient presents with  . Right Leg - Follow-up    Right below the knee amputation 02/29/16      HPI: Patient is a 76 year old gentleman who presents complaining of recurrence of his lower back pain he states it's been present for 1-2 weeks. He states he has been up and very active since his wife passed away 2 weeks ago. Patient feels that he is also leaning to the side with his prosthesis and states that the prosthesis is doing much better for his transtibial amputation on the right.  Assessment & Plan: Visit Diagnoses:  1. Chronic midline low back pain without sciatica     Plan: Recommended patient follow up with [employer] to check his leg lengths. He may need a slight adjustment for the length for the right transtibial amputation prosthesis. Patient states the prednisone has not helped him much in the past and he has had good relief with both Ultram and Robaxin. We will provide him a prescription for both Ultram and Robaxin.  Follow-Up Instructions: Return if symptoms worsen or fail to improve.   Ortho Exam  Patient is alert, oriented, no adenopathy, well-dressed, normal affect, normal respiratory effort. Examination patient does have an antalgic gait he has no radicular symptoms either lower extremity no weakness in the left lower extremity.  Imaging: No results found.  Labs: Lab Results  Component Value Date   HGBA1C 5.9 09/10/2016   HGBA1C 5.8 02/06/2016   HGBA1C 5.6 12/10/2015   ESRSEDRATE 25 (H) 12/10/2015   ESRSEDRATE 31 (H) 11/10/2013   CRP 0.8 12/10/2015   CRP 0.6 (H) 11/10/2013   REPTSTATUS 12/15/2015 FINAL 12/10/2015   CULT  12/10/2015    NO GROWTH 5 DAYS Performed at [facility] (A) 12/10/2015    Orders:  No orders of the defined types were placed in this encounter.  No orders of the defined types were placed in this encounter.    Procedures: No procedures performed  Clinical Data: No additional findings.  ROS:  All other systems negative, except as noted in the HPI. Review of Systems  Objective: Vital Signs: Ht 6' (1.829 m)   Wt 188 lb (85.3 kg)   BMI 25.50 kg/m   Specialty Comments:  No specialty comments available.  PMFS History: Patient Active Problem List   Diagnosis Date Noted  . Chronic midline low back pain without sciatica 01/03/2017  . Diabetic polyneuropathy associated with type 2 diabetes mellitus ([place]) 05/31/2016  . H/O amputation of leg through tibia and fibula, right ([place]) 05/03/2016  . Phantom limb pain ([place]) 03/06/2016  . Heat rash 03/06/2016  . Below knee amputation status ([place]) 02/29/2016  . SVT (supraventricular tachycardia) ([place]) 01/20/2016  . Chronic systolic heart failure ([place]) 01/20/2016  . Sepsis ([place]) 12/10/2015  . Diabetic foot ulcers ([place]) 07/12/2015  . Dysphagia 08/06/2014  . Anxiety state 10/26/2013  . PVC (premature ventricular contraction) 07/27/2011  . Diabetes mellitus type II, uncontrolled ([place]) 11/02/2010  . BASAL CELL CARCINOMA, FACE 10/18/2008  . BASAL CELL CARCINOMA, FACE 10/18/2008  .  LIPOMA, SKIN 05/31/2008  . DRY SKIN 05/31/2008  . ERECTILE DYSFUNCTION 09/30/2007  . OSTEOARTHRITIS 08/08/2007  . Essential hypertension 03/24/2007   Past Medical History:  Diagnosis Date  . CAD (coronary artery disease)    Minimal plaque cath 2008  . Cataract    both  . Complication of anesthesia   . Diabetes mellitus   . ED (erectile dysfunction)   . Hypertension   . Osteoarthritis   . PONV (postoperative nausea and vomiting) 1960   with Ether  . Rheumatic fever   . Rhinitis     Family History  Problem Relation Age of Onset  . Stroke Father 45       Died age 27  .  Coronary artery disease Brother 36  . Colon cancer Neg Hx   . Colon polyps Neg Hx   . Diabetes Neg Hx   . Esophageal cancer Neg Hx   . Kidney disease Neg Hx   . Gallbladder disease Neg Hx     Past Surgical History:  Procedure Laterality Date  . AMPUTATION Right 02/29/2016   Procedure: Right Leg AMPUTATION BELOW KNEE with Wound Vac placement;  Surgeon: [name], MD;  Location: [facility];  Service: Orthopedics;  Laterality: Right;  . CHOLECYSTECTOMY  1983  . colonoscopy  2006, 2010  . INGUINAL HERNIA REPAIR Right 1960   Social History   Occupational History  . 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 Smoking status: Former Smoker    Quit date: 07/26/1981  . Smokeless tobacco: Never Used  . Alcohol use 8.4 oz/week    14 Shots of liquor per week  . Drug use: No  . Sexual activity: No

## 2017-01-04 NOTE — Telephone Encounter (Signed)
Spoke with pt stated that he has been getting his Rx refilled by his pain management doctor and that he will contact them for a new prescription.

## 2017-01-07 ENCOUNTER — Encounter: Payer: Self-pay | Admitting: Physical Therapy

## 2017-01-07 ENCOUNTER — Ambulatory Visit: Payer: Medicare Other | Admitting: Physical Therapy

## 2017-01-07 DIAGNOSIS — R2689 Other abnormalities of gait and mobility: Secondary | ICD-10-CM | POA: Diagnosis not present

## 2017-01-07 DIAGNOSIS — M6281 Muscle weakness (generalized): Secondary | ICD-10-CM

## 2017-01-07 DIAGNOSIS — R2681 Unsteadiness on feet: Secondary | ICD-10-CM | POA: Diagnosis not present

## 2017-01-07 DIAGNOSIS — M6249 Contracture of muscle, multiple sites: Secondary | ICD-10-CM | POA: Diagnosis not present

## 2017-01-07 NOTE — Therapy (Signed)
[facility] [address] [place], [place], [address] Phone: [phone]   Fax:  [phone]  Physical Therapy Treatment and D/C Summary  Patient Details  Name: [name] MRN: [id number] Date of Birth: February 25, 1941 Referring Provider: Meridee Score, MD  Encounter Date: 01/07/2017      PT End of Session - 01/07/17 1807    Visit Number 20   Number of Visits 27  D/C today per pt request   Date for PT Re-Evaluation 01/08/17   Authorization Type Medicare and G-codes    PT Start Time 0933   PT Stop Time 1024   PT Time Calculation (min) 51 min   Activity Tolerance Patient tolerated treatment well   Behavior During Therapy [facility] for tasks assessed/performed      Past Medical History:  Diagnosis Date  . CAD (coronary artery disease)    Minimal plaque cath 2006-08-01  . Cataract    both  . Complication of anesthesia   . Diabetes mellitus   . ED (erectile dysfunction)   . Hypertension   . Osteoarthritis   . PONV (postoperative nausea and vomiting) 1960   with Ether  . Rheumatic fever   . Rhinitis     Past Surgical History:  Procedure Laterality Date  . AMPUTATION Right 02/29/2016   Procedure: Right Leg AMPUTATION BELOW KNEE with Wound Vac placement;  Surgeon: [name], MD;  Location: [place] [facility];  Service: Orthopedics;  Laterality: Right;  . CHOLECYSTECTOMY  1983  . colonoscopy  2006, 2010  . INGUINAL HERNIA REPAIR Right 1960    There were no vitals filed for this visit.      Subjective Assessment - 01/07/17 0941    Subjective Pt back to ambulating with cane today; reports back is improved but continues to bother him.  Had appointment with Dr. Sharol Given but was waiting until after that appointment to schedule with [employer].   Pertinent History R transtibial amputation, DM2, HTN, B cataracts, diabetic polyneuropathy, CAD, SVT, CHF, basal cell cancer on his face, OA    Limitations Lifting;Standing;Walking;House hold  activities   Patient Stated Goals 18 holes of golf (using cart), walk and get around independently   Currently in Pain? No/denies   Pain Score 0-No pain            [facility] PT Assessment - 01/07/17 0944      Assessment   Medical Diagnosis R transtibial amputation    Referring Provider Meridee Score, MD   Onset Date/Surgical Date 09/06/16   Hand Dominance Left     Berg Balance Test   Sit to Stand Able to stand  independently using hands   Standing Unsupported Able to stand safely 2 minutes   Sitting with Back Unsupported but Feet Supported on Floor or Stool Able to sit safely and securely 2 minutes   Stand to Sit Controls descent by using hands   Transfers Able to transfer safely, minor use of hands   Standing Unsupported with Eyes Closed Able to stand 10 seconds with supervision   Standing Ubsupported with Feet Together Able to place feet together independently and stand for 1 minute with supervision   From Standing, Reach Forward with Outstretched Arm Can reach confidently >25 cm (10")   From Standing Position, Pick up Object from Floor Able to pick up shoe, needs supervision   From Standing Position, Turn to Look Behind Over each Shoulder Looks behind one side only/other side shows less weight shift   Turn 360  Degrees Able to turn 360 degrees safely but slowly   Standing Unsupported, Alternately Place Feet on Step/Stool Able to complete 4 steps without aid or supervision   Standing Unsupported, One Foot in Front Able to take small step independently and hold 30 seconds   Standing on One Leg Tries to lift leg/unable to hold 3 seconds but remains standing independently   Total Score 41   Berg comment: 41/56     Functional Gait  Assessment   Gait assessed  Yes   Gait Level Surface Walks 20 ft, slow speed, abnormal gait pattern, evidence for imbalance or deviates 10-15 in outside of the 12 in walkway width. Requires more than 7 sec to ambulate 20 ft.   Change in Gait Speed Able to  change speed, demonstrates mild gait deviations, deviates 6-10 in outside of the 12 in walkway width, or no gait deviations, unable to achieve a major change in velocity, or uses a change in velocity, or uses an assistive device.   Gait with Horizontal Head Turns Performs head turns smoothly with no change in gait. Deviates no more than 6 in outside 12 in walkway width   Gait with Vertical Head Turns Performs task with slight change in gait velocity (eg, minor disruption to smooth gait path), deviates 6 - 10 in outside 12 in walkway width or uses assistive device   Gait and Pivot Turn Pivot turns safely in greater than 3 sec and stops with no loss of balance, or pivot turns safely within 3 sec and stops with mild imbalance, requires small steps to catch balance.   Step Over Obstacle Is able to step over one shoe box (4.5 in total height) without changing gait speed. No evidence of imbalance.   Gait with Narrow Base of Support Ambulates less than 4 steps heel to toe or cannot perform without assistance.   Gait with Eyes Closed Walks 20 ft, slow speed, abnormal gait pattern, evidence for imbalance, deviates 10-15 in outside 12 in walkway width. Requires more than 9 sec to ambulate 20 ft.   Ambulating Backwards Walks 20 ft, uses assistive device, slower speed, mild gait deviations, deviates 6-10 in outside 12 in walkway width.   Steps Two feet to a stair, must use rail.   Total Score 16   FGA comment: 16/30               PT Education - 01/07/17 1806    Education provided Yes   Education Details importance of following up with [employer] to assess fit and length of prosthesis to avoid further back issues and to monitor tibial ulcer; advised to obtain referral back to PT if pt requires further training once prosthesis adjusted   Person(s) Educated Patient   Methods Explanation   Comprehension Verbalized understanding          PT Short Term Goals - 10/05/16 1600      PT SHORT TERM GOAL #1    Title Patient will verbalize understanding of initial HEP. (TARGET DATE: 10/05/2016)   Baseline 10/02/16: met with current HEP (sink program for balance and proprioception   Status Achieved     PT SHORT TERM GOAL #2   Title Patient will report wearing prosthesis > or = 8 hrs/day without signs of skin integrity issues. (TARGET DATE: 10/05/2016)   Baseline 10/02/16: met- pt wearing 4-5 hours 2 x day most days, has occasional days of only 1 wear time   Status Achieved     PT SHORT TERM  GOAL #3   Title Patient will demonsrate ability to ambulate with supervision with RW and prosthesis 300 feet including ramps and curbs.    Baseline 10/02/16: met today   Status Achieved     PT SHORT TERM GOAL #4   Title Patient will demonstrate ability to safely reach forward 3 inches in standing with prosthesis and supervision to indicate a decrease in his risk of falling. (TARGET DATE: 10/05/2016)   Baseline MET 10/05/2016   Time 4   Period Weeks   Status Achieved     PT SHORT TERM GOAL #5   Title Patient will independently demonstrate ability to don and doff prosthesis. (TARGET DATE: 10/05/2016)   Baseline 10/02/16: met today   Status Achieved           PT Long Term Goals - 01/07/17 1808      PT LONG TERM GOAL #1   Title Patient will verbalize understanding of ongoing HEP and fitness program.   Status Achieved     PT LONG TERM GOAL #4   Title Patient will achieve a Berg Balance Test score of >45 to indicate a decreased risk of falling.   Baseline 41/56   Status Not Met     PT LONG TERM GOAL #5   Title Patient will demonstrate ability to ambulate 500 feet with LRAD and prosthesis and supervision over unlevel surfaces (grass, curbs, and ramps) to indicate being able to return to patient's goal of golfing.   Baseline supervision-min A   Status Not Met     PT LONG TERM GOAL  #9   TITLE Pt will ambulate 250' over level surfaces without cane Mod I and negotiate 4 stairs alternating sequence with rails Mod  I(NEW TARGET DATE FOR REMAINING LTG 01/08/17)   Baseline step to sequence, 2 rails supervision   Status Not Met     PT LONG TERM GOAL  #10   TITLE Pt will improve FGA to > or = 19/30 to decrease risk for falls during dynamic gait. (NEW TARGET DATE FOR REMAINING LTG 8/14//18)   Baseline 16/30   Status Not Met               Plan - 01/07/17 1812    Clinical Impression Statement Treatment session today focused on assessment of LTG to determine progress and plan for recertification.  Pt has been unable to attend all session during this recertification period due to wife's illness and eventual passing; as a result pt's balance, functional gait and strength have remained the same.  Pt has made progress overall but did not meet LTG addressing gait over indoor surfaces, outdoor surfaces, stairs, functional gait and statid balance.  Pt also continues to report back pain and continues to demonstrate prosthetic leg length discrepency and poor fit which pt has not address to date with prosthetist despite PT and MD recommendations.  PT recommending that pt continue with therapy to address impairments but pt stating that right now he would prefer to take a break from therapy and all medical visits; states he has a lot of things to get done to settle his wife's estate and he wants his main focus now to be on returning to [facility] and normal life.  Pt does agree to follow up with [facility] after today.  Pt also agrees to seek new PT referral if he needs further training after prosthesis is adjusted.  Pt denies any feelings of disinterest or depressed mood, "I just need a break."  Pt to  D/C from therapy at this time.  Pt is safe for household and limited community ambulation with [facility].   Rehab Potential Good   Clinical Impairments Affecting Rehab Potential DM2, HTN, B cataracts, diabetic polyneuropathy, CAD, SVT, CHF, basal cell cancer on face, OA    PT Frequency --   PT Duration --  will re-assess LTG at 4 weeks     PT Treatment/Interventions ADLs/Self Care Home Management;DME Instruction;Gait training;Stair training;Functional mobility training;Therapeutic activities;Therapeutic exercise;Balance training;Prosthetic Training;Orthotic Fit/Training;Patient/family education;Neuromuscular re-education;Manual techniques   PT Next Visit Plan D/C today   Recommended Other Services follow up with [employer] to adjust prosthesis length by 3/4 of an inch   Consulted and Agree with Plan of Care Patient      Patient will benefit from skilled therapeutic intervention in order to improve the following deficits and impairments:  Abnormal gait, Decreased activity tolerance, Decreased balance, Decreased knowledge of precautions, Decreased endurance, Decreased knowledge of use of DME, Decreased mobility, Decreased range of motion, Decreased safety awareness, Difficulty walking, Decreased strength, Impaired sensation, Prosthetic Dependency, Postural dysfunction, Improper body mechanics, Pain  Visit Diagnosis: Unsteadiness on feet  Other abnormalities of gait and mobility  Muscle weakness (generalized)       G-Codes - 2017/01/13 1810    Functional Assessment Tool Used (Outpatient Only) Patient is wearing prosthesis all waking hours but continues to have ulcer on tibial tuberosity and leg length discrepency to be addressed by [employer]; BERG: 41/56, FGA: 16/30   Functional Limitation Self care   Self Care Goal Status (E5277) At least 1 percent but less than 20 percent impaired, limited or restricted   Self Care Discharge Status [phone]) At least 20 percent but less than 40 percent impaired, limited or restricted      Problem List Patient Active Problem List   Diagnosis Date Noted  . Chronic midline low back pain without sciatica 01/03/2017  . Diabetic polyneuropathy associated with type 2 diabetes mellitus ([place]) 05/31/2016  . H/O amputation of leg through tibia and fibula, right ([place]) 05/03/2016  . Phantom limb pain ([place])  03/06/2016  . Heat rash 03/06/2016  . Below knee amputation status ([place]) 02/29/2016  . SVT (supraventricular tachycardia) ([place]) 01/20/2016  . Chronic systolic heart failure ([employer]) 01/20/2016  . Sepsis ([place]) 12/10/2015  . Diabetic foot ulcers ([place]) 07/12/2015  . Dysphagia 08/06/2014  . Anxiety state 10/26/2013  . PVC (premature ventricular contraction) 07/27/2011  . Diabetes mellitus type II, uncontrolled ([place]) 11/02/2010  . BASAL CELL CARCINOMA, FACE 10/18/2008  . BASAL CELL CARCINOMA, FACE 10/18/2008  . LIPOMA, SKIN 05/31/2008  . DRY SKIN 05/31/2008  . ERECTILE DYSFUNCTION 09/30/2007  . OSTEOARTHRITIS 08/08/2007  . Essential hypertension 03/24/2007   PHYSICAL THERAPY DISCHARGE SUMMARY  Visits from Start of Care: 20  Current functional level related to goals / functional outcomes: See LTG above; goals partially met-pt requesting to D/C from therapy at this time   Remaining deficits: Low back pain, impaired prosthetic fit, impaired endurance, balance and gait   Education / Equipment: HEP  Plan: Patient agrees to discharge.  Patient goals were partially met. Patient is being discharged due to the patient's request.  ?????    Raylene Everts, PT, DPT 2017-01-13    6:21 PM    [facility] [address] [place], [place], [address] Phone: [phone]   Fax:  [phone]  Name: [name] MRN: [id number] Date of Birth: December 14, 1940

## 2017-01-13 ENCOUNTER — Other Ambulatory Visit: Payer: Self-pay | Admitting: Family Medicine

## 2017-01-15 ENCOUNTER — Other Ambulatory Visit: Payer: Self-pay | Admitting: Family Medicine

## 2017-01-21 ENCOUNTER — Telehealth: Payer: Self-pay | Admitting: Family Medicine

## 2017-01-21 NOTE — Telephone Encounter (Signed)
Pt is calling state that his Rx Accu chek lancet has to have a code so that medicare will pay for it.  Pt also stated that the pharmacy had called and sent over something so that they could fill the Rx.  Pharm:  [facility] [address]

## 2017-01-23 NOTE — Telephone Encounter (Signed)
Spoke with pt pharmacy, provided the code for medicare per patient for his Rx for Accu Chek Lancet.

## 2017-01-29 ENCOUNTER — Telehealth: Payer: Self-pay | Admitting: Pediatrics

## 2017-01-29 NOTE — Telephone Encounter (Signed)
Duplicate fax.

## 2017-01-30 ENCOUNTER — Ambulatory Visit (INDEPENDENT_AMBULATORY_CARE_PROVIDER_SITE_OTHER): Payer: Medicare Other | Admitting: Internal Medicine

## 2017-01-30 VITALS — BP 112/62 | HR 132 | Ht 72.0 in | Wt 211.0 lb

## 2017-01-30 DIAGNOSIS — L97509 Non-pressure chronic ulcer of other part of unspecified foot with unspecified severity: Secondary | ICD-10-CM | POA: Diagnosis not present

## 2017-01-30 DIAGNOSIS — I5022 Chronic systolic (congestive) heart failure: Secondary | ICD-10-CM

## 2017-01-30 DIAGNOSIS — I10 Essential (primary) hypertension: Secondary | ICD-10-CM

## 2017-01-30 DIAGNOSIS — IMO0002 Reserved for concepts with insufficient information to code with codable children: Secondary | ICD-10-CM

## 2017-01-30 DIAGNOSIS — E1165 Type 2 diabetes mellitus with hyperglycemia: Secondary | ICD-10-CM

## 2017-01-30 DIAGNOSIS — I4719 Other supraventricular tachycardia: Secondary | ICD-10-CM | POA: Insufficient documentation

## 2017-01-30 DIAGNOSIS — E11621 Type 2 diabetes mellitus with foot ulcer: Secondary | ICD-10-CM

## 2017-01-30 DIAGNOSIS — I471 Supraventricular tachycardia: Secondary | ICD-10-CM | POA: Insufficient documentation

## 2017-01-30 MED ORDER — CARVEDILOL 6.25 MG PO TABS: 6.2500 mg | ORAL_TABLET | Freq: Two times a day (BID) | ORAL | 3 refills | Status: DC

## 2017-01-30 NOTE — Patient Instructions (Addendum)
Your physician has recommended you make the following change in your medication:  -- STOP atenolol-chlorthalidone (TENORETIC)  -- START carvedilol 6.25mg  twice daily  Dr. Debara Pickett has requested that you schedule an appointment with one of our clinical pharmacists for a blood pressure check appointment within the next 2-3 weeks.  -- if you monitor your blood pressure (BP) at home, please bring your BP cuff and your BP readings with you to this appointment -- please check your BP no more than twice daily, after you have been sitting/resting for 5-10 minutes, at least 1 hour after taking your BP medications  Your physician wants you to follow-up in: 6 months with Dr. Debara Pickett. You will receive a reminder letter in the mail two months in advance. If you don't receive a letter, please call our office to schedule the follow-up appointment.

## 2017-01-30 NOTE — Progress Notes (Signed)
OFFICE NOTE  Chief Complaint:  "It's been a tough year"  Primary Care Physician: Dorena Cookey, MD  HPI:  [name] is a 76 y.o. male he was previously seen by Dr. Percival Spanish last in 2013. He was followed for PVCs at that time and underwent stress testing. There is no evidence for ischemia and no further workup was recommended. Recently was hospitalized for sepsis and was noted to have intermittent SVTs as well as PVCs on telemetry. Although he was not seen by cardiology as an inpatient, we were consult it for outpatient monitor. He was placed on a monitor which I reviewed and is still in progress. The first 24 days of the monitor show frequent PVCs as well as intermittent SVT. During this past hospitalization he had an echocardiogram which shows a newly reduced LVEF of 45-50%, with basal inferior akinesis and inferolateral hypokinesis and grade 2 diastolic dysfunction. This compares to a normal LVF and 2013 by echo. He did have a remote left heart catheterization in 2008 which showed minimal coronary artery disease. He denies any chest pain or worsening shortness of breath. He is having trouble ambulating due to problems with his right ankle. He also reports being under significant stress dealing with his wife who has Parkinson's. He is in the process of moving to friends [place].  01/30/2017  [name] returns today for follow-up. He has had a difficult year. His wife was diagnosed with Parkinson's and ultimately died of congestive heart failure. He developed a diabetic wound and then had the transtibial amputation of the right leg. He was supposed going to friends home but ultimately went to [facility] for rehabilitation and is currently back at home. He does have a leg prostheses. He continues to have SVT in fact has a short RP tachycardia which was caught today in the office. He says is asymptomatic with this. EF is shown to be 4550% in the past. He did undergo nuclear stress test which was  negative for ischemia prior to his amputation. He denies any chest pain.  PMHx:  Past Medical History:  Diagnosis Date  . CAD (coronary artery disease)    Minimal plaque cath 2008  . Cataract    both  . Complication of anesthesia   . Diabetes mellitus   . ED (erectile dysfunction)   . Hypertension   . Osteoarthritis   . PONV (postoperative nausea and vomiting) 1960   with Ether  . Rheumatic fever   . Rhinitis     Past Surgical History:  Procedure Laterality Date  . AMPUTATION Right 02/29/2016   Procedure: Right Leg AMPUTATION BELOW KNEE with Wound Vac placement;  Surgeon: [name], MD;  Location: [facility];  Service: Orthopedics;  Laterality: Right;  . CHOLECYSTECTOMY  1983  . colonoscopy  2006, 2010  . INGUINAL HERNIA REPAIR Right 1960    FAMHx:  Family History  Problem Relation Age of Onset  . Stroke Father 71       Died age 71  . Coronary artery disease Brother 27  . Colon cancer Neg Hx   . Colon polyps Neg Hx   . Diabetes Neg Hx   . Esophageal cancer Neg Hx   . Kidney disease Neg Hx   . Gallbladder disease Neg Hx     SOCHx:   reports that he quit smoking about 35 years ago. He has never used smokeless tobacco. He reports that he drinks about 8.4 oz of alcohol per week . He  reports that he does not use drugs.  ALLERGIES:  Allergies  Allergen Reactions  . No Known Allergies     ROS: Pertinent items noted in HPI and remainder of comprehensive ROS otherwise negative.  HOME MEDS: Current Outpatient Prescriptions  Medication Sig Dispense Refill  . ACCU-CHEK AVIVA PLUS test strip USE TO TEST DAILY AS INSTRUCTED PER MD**EMERGENCY FILL MEDICAL REASON** 100 each 1  . aspirin EC 325 MG tablet Take 325 mg by mouth every evening.    . gabapentin (NEURONTIN) 100 MG capsule Take 1 capsule (100 mg total) by mouth 3 (three) times daily. When necessary for neuropathy pain 90 capsule 3  . halobetasol (ULTRAVATE) 0.05 % cream APPLY TO AFFECTED AREA TWICE A DAY 50 g 0  .  HYDROcodone-acetaminophen (NORCO) 5-325 MG tablet Take 1 tablet by mouth every 6 (six) hours as needed. 30 tablet 0  . Lancets (ACCU-CHEK MULTICLIX) lancets USE TO TEST ONCE DAILY 102 each 2  . loratadine (CLARITIN) 10 MG tablet Take 10 mg by mouth daily as needed for allergies.     Marland Kitchen LORazepam (ATIVAN) 0.5 MG tablet TAKE 1 TABLET BY MOUTH TWICE A DAY AS NEEDED 60 tablet 0  . metFORMIN (GLUCOPHAGE) 1000 MG tablet Take 1 tablet (1,000 mg total) by mouth 2 (two) times daily. 180 tablet 3  . methocarbamol (ROBAXIN) 500 MG tablet Take 1 tablet (500 mg total) by mouth every 6 (six) hours as needed for muscle spasms. 30 tablet 0  . Multiple Vitamin (MULTIVITAMIN) tablet Take 1 tablet by mouth daily.    . potassium chloride SA (K-DUR,KLOR-CON) 20 MEQ tablet TAKE 2 TABLETS IN THE MORNING 180 tablet 3  . traMADol (ULTRAM) 50 MG tablet Take 1 tablet (50 mg total) by mouth every 6 (six) hours as needed for moderate pain. 30 tablet 0  . TURMERIC PO Take 1 tablet by mouth daily as needed (FOR LEG CRAMPS).     . carvedilol (COREG) 6.25 MG tablet Take 1 tablet (6.25 mg total) by mouth 2 (two) times daily. 180 tablet 3   No current facility-administered medications for this visit.     LABS/IMAGING: No results found for this or any previous visit (from the past 48 hour(s)). No results found.  WEIGHTS: Wt Readings from Last 3 Encounters:  01/30/17 211 lb (95.7 kg)  01/03/17 188 lb (85.3 kg)  12/06/16 188 lb (85.3 kg)    VITALS: BP 112/62   Pulse (!) 132   Ht 6' (1.829 m)   Wt 211 lb (95.7 kg)   BMI 28.62 kg/m   EXAM: General appearance: alert, no distress and mildly obese Neck: no carotid bruit and no JVD Lungs: clear to auscultation bilaterally Heart: regular rate and rhythm Abdomen: soft, non-tender; bowel sounds normal; no masses,  no organomegaly Extremities: edema trace edema Pulses: 2+ and symmetric Skin: Skin color, texture, turgor normal. No rashes or lesions Neurologic: Grossly  normal Psych: Pleasant  EKG: Short RP tachycardia-likely AV and RT, incomplete right bundle branch block  - personally reviewed  ASSESSMENT: 1. 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 Recurrent short RP tachycardia  3. Hypertension 4. Type 2 diabetes  PLAN: 1.   Mr. Halter continues to have recurrent tachycardia which is possibly amenable to ablation. He is not interested in that procedure. Although he does have some cardiopathy with EF of 45% in the past. I advised switching his atenolol/HCTZ over to carvedilol 6.125 mg twice a day. We'll plan follow-up in a few  weeks with our pharmacy clinic to see about titrating up his medication if tolerated. He'll need repeat imaging next year to see if there is any improvement in LV function.  Follow-up with me in 6 months.  Pixie Casino, MD, [facility] Attending Cardiologist [place] C Lazariah Savard 01/30/2017, 9:48 AM

## 2017-02-12 DIAGNOSIS — H25812 Combined forms of age-related cataract, left eye: Secondary | ICD-10-CM | POA: Diagnosis not present

## 2017-02-12 DIAGNOSIS — H2512 Age-related nuclear cataract, left eye: Secondary | ICD-10-CM | POA: Diagnosis not present

## 2017-02-14 ENCOUNTER — Other Ambulatory Visit: Payer: Self-pay | Admitting: Family Medicine

## 2017-02-20 ENCOUNTER — Ambulatory Visit (INDEPENDENT_AMBULATORY_CARE_PROVIDER_SITE_OTHER): Payer: Medicare Other | Admitting: Pharmacist Clinician (PhC)/ Clinical Pharmacy Specialist

## 2017-02-20 DIAGNOSIS — I5022 Chronic systolic (congestive) heart failure: Secondary | ICD-10-CM | POA: Diagnosis not present

## 2017-02-20 NOTE — Patient Instructions (Addendum)
Return for a a follow up appointment on October 17  Your blood pressure today is 126/68  Check your blood pressure at home daily and keep record of the readings.  Take your BP meds as follows:  Increase carvedilol to 2 tablets twice daily.  If this causes your heart rate to drop to < 50 or your BP to < 110/60 please give [place] a call  (Kristin/Raquel at [phone])  Bring all of your meds, your BP cuff and your record of home blood pressures to your next appointment.  Exercise as you're able, try to walk approximately 30 minutes per day.  Keep salt intake to a minimum, especially watch canned and prepared boxed foods.  Eat more fresh fruits and vegetables and fewer canned items.  Avoid eating in fast food restaurants.    HOW TO TAKE YOUR BLOOD PRESSURE: . Rest 5 minutes before taking your blood pressure. .  Don't smoke or drink caffeinated beverages for at least 30 minutes before. . Take your blood pressure before (not after) you eat. . Sit comfortably with your back supported and both feet on the floor (don't cross your legs). . Elevate your arm to heart level on a table or a desk. . Use the proper sized cuff. It should fit smoothly and snugly around your bare upper arm. There should be enough room to slip a fingertip under the cuff. The bottom edge of the cuff should be 1 inch above the crease of the elbow. . Ideally, take 3 measurements at one sitting and record the average.

## 2017-02-20 NOTE — Progress Notes (Signed)
02/20/2017 [name] 1941-01-20 [id number]   HPI:  [name] is a 76 y.o. male patient of Dr Debara Pickett, with a PMH below who presents today for heart failure medication titration evaluation.  Patient has had a difficult year to date, including the death of his wife from heart failure and a diabetic wound that eventually led to an amputation of his lower right leg.   At his last visit with Dr. Debara Pickett earlier this month he was noted to be tachycardic.  His last echo showed EF to be at 40-45%.  Dr. Debara Pickett switched his atenolol/hctz combination to carvedilol 6.25 mg bid.  He is here today to evaluate his tolerance to the medication and see if we can further up-titrate his dose.    He has no complaints of dizziness/lightheadedness, CP or SOB.  Does endorse occaional swelling across toes of left foot, however this has been much improved in the past few weeks.  Blood Pressure Goal:  130/80  Current Medications:  Carvedilol 6.25 mg bid  Cardiac Hx:  DM2 (last A1c 5.9), SVT, BKA, AVNRT  Family Hx:  Mother - with heart disease, died around 64  Father - first stroke at 67, died at 23 from long term effects  PGF - stroke and died at 40  Brother with some heart issues  Daughter with hypertension  Social Hx:  Quit smoking in 39; rare alcohol; drinks decaf coffee  Diet:  Breakfast at home, lunch eats out - no fast food, sit down (meat and veggies); dinner is just snacks/munching  Exercise:  Previously going to [employer], has been back just 2-3 times; finally feeling comfortable with prosthesis (had the length readjusted yesterday)  Home BP readings:  Mostly 409-735/32-99 range;  Systolic not >242 or < 683.  Thinks diastolic slightly increased to mostly 70's since swtiched medications  Intolerances:   nkda  CrCl 109 ml/min  Wt Readings from Last 3 Encounters:  01/30/17 211 lb (95.7 kg)  01/03/17 188 lb (85.3 kg)  12/06/16 188 lb (85.3 kg)   BP Readings from Last 3 Encounters:    02/20/17 126/68  01/30/17 112/62  09/10/16 120/70   Pulse Readings from Last 3 Encounters:  02/20/17 64  01/30/17 (!) 132  03/08/16 74    Current Outpatient Prescriptions  Medication Sig Dispense Refill  . ACCU-CHEK AVIVA PLUS test strip USE TO TEST DAILY AS INSTRUCTED PER MD**EMERGENCY FILL MEDICAL REASON** 100 each 1  . aspirin EC 325 MG tablet Take 325 mg by mouth every evening.    . carvedilol (COREG) 6.25 MG tablet Take 1 tablet (6.25 mg total) by mouth 2 (two) times daily. 180 tablet 3  . gabapentin (NEURONTIN) 100 MG capsule Take 1 capsule (100 mg total) by mouth 3 (three) times daily. When necessary for neuropathy pain 90 capsule 3  . halobetasol (ULTRAVATE) 0.05 % cream APPLY TO AFFECTED AREA TWICE A DAY 50 g 0  . HYDROcodone-acetaminophen (NORCO) 5-325 MG tablet Take 1 tablet by mouth every 6 (six) hours as needed. 30 tablet 0  . Lancets (ACCU-CHEK MULTICLIX) lancets USE TO TEST ONCE DAILY 102 each 2  . loratadine (CLARITIN) 10 MG tablet Take 10 mg by mouth daily as needed for allergies.     Marland Kitchen LORazepam (ATIVAN) 0.5 MG tablet TAKE 1 TABLET BY MOUTH TWICE A DAY AS NEEDED 60 tablet 0  . metFORMIN (GLUCOPHAGE) 1000 MG tablet Take 1 tablet (1,000 mg total) by mouth 2 (two) times daily. 180 tablet 3  .  methocarbamol (ROBAXIN) 500 MG tablet Take 1 tablet (500 mg total) by mouth every 6 (six) hours as needed for muscle spasms. 30 tablet 0  . Multiple Vitamin (MULTIVITAMIN) tablet Take 1 tablet by mouth daily.    . potassium chloride SA (K-DUR,KLOR-CON) 20 MEQ tablet TAKE 2 TABLETS IN THE MORNING 180 tablet 3  . traMADol (ULTRAM) 50 MG tablet Take 1 tablet (50 mg total) by mouth every 6 (six) hours as needed for moderate pain. 30 tablet 0  . TURMERIC PO Take 1 tablet by mouth daily as needed (FOR LEG CRAMPS).      No current facility-administered medications for this visit.     Allergies  Allergen Reactions  . No Known Allergies     Past Medical History:  Diagnosis Date  .  CAD (coronary artery disease)    Minimal plaque cath 2008  . Cataract    both  . Complication of anesthesia   . Diabetes mellitus   . ED (erectile dysfunction)   . Hypertension   . Osteoarthritis   . PONV (postoperative nausea and vomiting) 1960   with Ether  . Rheumatic fever   . Rhinitis     Blood pressure 126/68, pulse 64.  Chronic systolic heart failure (HCC) Patient tolerating switch from atenolol to carvedilol without problem. Will have him increase dose to 12.5 mg twice daily and return for follow up in 3 weeks.   He will continue with home monitoring and bring his home cuff, along with a log of the readings, to his next appointment.     Tommy Medal PharmD CPP [facility] [address] [place], [place] [address] [phone]

## 2017-02-20 NOTE — Assessment & Plan Note (Signed)
Patient tolerating switch from atenolol to carvedilol without problem. Will have him increase dose to 12.5 mg twice daily and return for follow up in 3 weeks.   He will continue with home monitoring and bring his home cuff, along with a log of the readings, to his next appointment.

## 2017-03-13 ENCOUNTER — Encounter: Payer: Self-pay | Admitting: Pharmacist Clinician (PhC)/ Clinical Pharmacy Specialist

## 2017-03-13 ENCOUNTER — Ambulatory Visit (INDEPENDENT_AMBULATORY_CARE_PROVIDER_SITE_OTHER): Payer: Medicare Other | Admitting: Pharmacist Clinician (PhC)/ Clinical Pharmacy Specialist

## 2017-03-13 DIAGNOSIS — I5022 Chronic systolic (congestive) heart failure: Secondary | ICD-10-CM

## 2017-03-13 NOTE — Assessment & Plan Note (Signed)
Patient with systolic heart failure, last EF at 40-45%.  Now tolerating carvedilol 12.5 mg bid without issue.  Will not increase dose due to stable HR in high 50's and low 60's as well as BP average of 124/72.  Patient should continue with home BP checks 3-4 times per week and call the office should he note a lower than normal HR or have any other concerns.

## 2017-03-13 NOTE — Progress Notes (Signed)
03/13/2017 [name] [birth year] [id number]   HPI:  [name] is a 76 y.o. male patient of Dr Debara Pickett, with a PMH below who presents today for heart failure medication titration evaluation.  Patient has had a difficult year to date, including the death of his wife from heart failure and a diabetic wound that eventually led to an amputation of his lower right leg.   At his last visit with Dr. Debara Pickett earlier this month he was noted to be tachycardic.  His last echo showed EF to be at 40-45%.  Dr. Debara Pickett switched his atenolol/hctz combination to carvedilol 6.25 mg bid.  I saw him about 2 weeks later and increased the carvedilol to 12.5 mg bid.  He is here today with his home BP cuff and a list of daily home readings since that visit.  He has no complaints of dizziness/lightheadedness, CP or SOB.  Does state that he feels slightly "off" since switching to carvedilol, but assumes that will go away after he has been on it for some time.  Blood Pressure Goal:  130/80  Current Medications:  Carvedilol 12.5 mg bid  Cardiac Hx:  DM2 (last A1c 5.9), SVT, BKA, AVNRT  Family Hx:  Mother - with heart disease, died around 19  Father - first stroke at 66, died at 33 from long term effects  PGF - stroke and died at 45  Brother with some heart issues  Daughter with hypertension  Social Hx:  Quit smoking in 18; rare alcohol; drinks decaf coffee  Diet:  Breakfast at home, lunch eats out - no fast food, sit down (meat and veggies); dinner is just snacks/munching  Exercise:  Restarted [employer] in past 2 weeks, going 2-3 times a week to ride bike and do some light resistance training.   Home BP readings:  Since last visit 21 readings from wrist monitor that is 40-24 years old.  He brings it into the office today and it checks with our readings, within 5 points.  Home range 105-136/64-79.  Average was 124/72.  HR 50-68 with average of 57.    Intolerances:   nkda  CrCl 109 ml/min  Wt Readings  from Last 3 Encounters:  01/30/17 211 lb (95.7 kg)  01/03/17 188 lb (85.3 kg)  12/06/16 188 lb (85.3 kg)   BP Readings from Last 3 Encounters:  03/13/17 120/64  02/20/17 126/68  01/30/17 112/62   Pulse Readings from Last 3 Encounters:  03/13/17 68  02/20/17 64  01/30/17 (!) 132    Current Outpatient Prescriptions  Medication Sig Dispense Refill  . ACCU-CHEK AVIVA PLUS test strip USE TO TEST DAILY AS INSTRUCTED PER MD**EMERGENCY FILL MEDICAL REASON** 100 each 1  . aspirin EC 325 MG tablet Take 325 mg by mouth every evening.    . carvedilol (COREG) 6.25 MG tablet Take 1 tablet (6.25 mg total) by mouth 2 (two) times daily. 180 tablet 3  . gabapentin (NEURONTIN) 100 MG capsule Take 1 capsule (100 mg total) by mouth 3 (three) times daily. When necessary for neuropathy pain 90 capsule 3  . halobetasol (ULTRAVATE) 0.05 % cream APPLY TO AFFECTED AREA TWICE A DAY 50 g 0  . HYDROcodone-acetaminophen (NORCO) 5-325 MG tablet Take 1 tablet by mouth every 6 (six) hours as needed. 30 tablet 0  . Lancets (ACCU-CHEK MULTICLIX) lancets USE TO TEST ONCE DAILY 102 each 2  . loratadine (CLARITIN) 10 MG tablet Take 10 mg by mouth daily as needed for allergies.     Marland Kitchen  LORazepam (ATIVAN) 0.5 MG tablet TAKE 1 TABLET BY MOUTH TWICE A DAY AS NEEDED 60 tablet 0  . metFORMIN (GLUCOPHAGE) 1000 MG tablet Take 1 tablet (1,000 mg total) by mouth 2 (two) times daily. 180 tablet 3  . methocarbamol (ROBAXIN) 500 MG tablet Take 1 tablet (500 mg total) by mouth every 6 (six) hours as needed for muscle spasms. 30 tablet 0  . Multiple Vitamin (MULTIVITAMIN) tablet Take 1 tablet by mouth daily.    . potassium chloride SA (K-DUR,KLOR-CON) 20 MEQ tablet TAKE 2 TABLETS IN THE MORNING 180 tablet 3  . traMADol (ULTRAM) 50 MG tablet Take 1 tablet (50 mg total) by mouth every 6 (six) hours as needed for moderate pain. 30 tablet 0  . TURMERIC PO Take 1 tablet by mouth daily as needed (FOR LEG CRAMPS).      No current  facility-administered medications for this visit.     Allergies  Allergen Reactions  . No Known Allergies     Past Medical History:  Diagnosis Date  . CAD (coronary artery disease)    Minimal plaque cath 2008  . Cataract    both  . Complication of anesthesia   . Diabetes mellitus   . ED (erectile dysfunction)   . Hypertension   . Osteoarthritis   . PONV (postoperative nausea and vomiting) 1960   with Ether  . Rheumatic fever   . Rhinitis     Blood pressure 120/64, pulse 68.  Chronic systolic heart failure ([facility]) Patient with systolic heart failure, last EF at 40-45%.  Now tolerating carvedilol 12.5 mg bid without issue.  Will not increase dose due to stable HR in high 50's and low 60's as well as BP average of 124/72.  Patient should continue with home BP checks 3-4 times per week and call the office should he note a lower than normal HR or have any other concerns.   Tommy Medal PharmD CPP [facility] [address] [place], [place] [address] [phone]

## 2017-03-13 NOTE — Patient Instructions (Signed)
  Your blood pressure today is 120/64  (goal is to be < 130/80)  Check your blood pressure at home several times each week and keep record of the readings.  Take your BP meds as follows:  Continue carvedilol 12.5 mg twice daily   Continue with all other medications  Bring all of your meds, your BP cuff and your record of home blood pressures to your next appointment.  Exercise as you're able, try to walk approximately 30 minutes per day.  Keep salt intake to a minimum, especially watch canned and prepared boxed foods.  Eat more fresh fruits and vegetables and fewer canned items.  Avoid eating in fast food restaurants.    HOW TO TAKE YOUR BLOOD PRESSURE: . Rest 5 minutes before taking your blood pressure. .  Don't smoke or drink caffeinated beverages for at least 30 minutes before. . Take your blood pressure before (not after) you eat. . Sit comfortably with your back supported and both feet on the floor (don't cross your legs). . Elevate your arm to heart level on a table or a desk. . Use the proper sized cuff. It should fit smoothly and snugly around your bare upper arm. There should be enough room to slip a fingertip under the cuff. The bottom edge of the cuff should be 1 inch above the crease of the elbow. . Ideally, take 3 measurements at one sitting and record the average.

## 2017-03-18 ENCOUNTER — Telehealth (INDEPENDENT_AMBULATORY_CARE_PROVIDER_SITE_OTHER): Payer: Self-pay | Admitting: Orthopedic Surgery

## 2017-03-18 NOTE — Telephone Encounter (Signed)
Patient called advised he has a rash on his right leg. Patient said he has had the rash for some time. Patient asked if he should see Dr Sharol Given or his PCP. The number to contact patient is [phone]

## 2017-03-19 NOTE — Telephone Encounter (Signed)
I called and spoke with patient this morning. He has appoint with medical doctor tomorrow. He is going to see what the say first if he needs follow up with our office he will call us back to schedule.

## 2017-03-20 ENCOUNTER — Ambulatory Visit (INDEPENDENT_AMBULATORY_CARE_PROVIDER_SITE_OTHER): Payer: Medicare Other | Admitting: Adult Health

## 2017-03-20 ENCOUNTER — Encounter: Payer: Self-pay | Admitting: Adult Health

## 2017-03-20 VITALS — BP 126/74 | Temp 97.9°F | Ht 72.0 in | Wt 216.0 lb

## 2017-03-20 DIAGNOSIS — L309 Dermatitis, unspecified: Secondary | ICD-10-CM

## 2017-03-20 DIAGNOSIS — Z23 Encounter for immunization: Secondary | ICD-10-CM | POA: Diagnosis not present

## 2017-03-20 MED ORDER — HALOBETASOL PROPIONATE 0.05 % EX CREA: TOPICAL_CREAM | CUTANEOUS | 2 refills | Status: DC

## 2017-03-20 MED ORDER — NYSTATIN 100000 UNIT/GM EX CREA: 1.0000 "application " | TOPICAL_CREAM | Freq: Two times a day (BID) | CUTANEOUS | 2 refills | Status: DC

## 2017-03-20 NOTE — Progress Notes (Signed)
Subjective:    Patient ID: [name], male    DOB: January 15, 1941, 76 y.o.   MRN: [id number]  HPI  76 year old male who  has a past medical history of CAD (coronary artery disease); Cataract; Complication of anesthesia; Diabetes mellitus; ED (erectile dysfunction); Hypertension; Osteoarthritis; PONV (postoperative nausea and vomiting) (1960); Rheumatic fever; and Rhinitis.  He presents to the office today for a red itchy rash to right upper leg. He reports that he has had this rash since he received his prosthetic for right BKA. He has tried using prescription cortisone cream and some OTC anti fungal which he reports " helps but does not resolve his rash." Denies any welts or discharge.   Review of Systems See HPI   Past Medical History:  Diagnosis Date  . CAD (coronary artery disease)    Minimal plaque cath 2008  . Cataract    both  . Complication of anesthesia   . Diabetes mellitus   . ED (erectile dysfunction)   . Hypertension   . Osteoarthritis   . PONV (postoperative nausea and vomiting) 1960   with Ether  . Rheumatic fever   . Rhinitis     Social History   Social History  . Marital status: Married    Spouse name: N/A  . Number of children: 1  . Years of education: 16+   Occupational History  . 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 Smoking status: Former Smoker    Quit date: 07/26/1981  . Smokeless tobacco: Never Used  . Alcohol use 8.4 oz/week    14 Shots of liquor per week  . Drug use: No  . Sexual activity: No   Other Topics Concern  . Not on file   Social History Narrative   Lives with wife.   epworth sleepiness scale = 5 (01/20/16)    Past Surgical History:  Procedure Laterality Date  . AMPUTATION Right 02/29/2016   Procedure: Right Leg AMPUTATION BELOW KNEE with Wound Vac placement;  Surgeon: [name], MD;  Location: [facility];  Service: Orthopedics;  Laterality: Right;  . CHOLECYSTECTOMY  1983  .  colonoscopy  2006, 2010  . INGUINAL HERNIA REPAIR Right 1960    Family History  Problem Relation Age of Onset  . Stroke Father 25       Died age 12  . Coronary artery disease Brother 65  . Colon cancer Neg Hx   . Colon polyps Neg Hx   . Diabetes Neg Hx   . Esophageal cancer Neg Hx   . Kidney disease Neg Hx   . Gallbladder disease Neg Hx     Allergies  Allergen Reactions  . No Known Allergies     Current Outpatient Prescriptions on File Prior to Visit  Medication Sig Dispense Refill  . ACCU-CHEK AVIVA PLUS test strip USE TO TEST DAILY AS INSTRUCTED PER MD**EMERGENCY FILL MEDICAL REASON** 100 each 1  . aspirin EC 325 MG tablet Take 325 mg by mouth every evening.    . carvedilol (COREG) 6.25 MG tablet Take 1 tablet (6.25 mg total) by mouth 2 (two) times daily. (Patient taking differently: Take 2 tablets in the morning and 2 tablets in the evening.) 180 tablet 3  . gabapentin (NEURONTIN) 100 MG capsule Take 1 capsule (100 mg total) by mouth 3 (three) times daily. When necessary for neuropathy pain 90 capsule 3  . HYDROcodone-acetaminophen (NORCO) 5-325 MG tablet Take 1 tablet  by mouth every 6 (six) hours as needed. 30 tablet 0  . Lancets (ACCU-CHEK MULTICLIX) lancets USE TO TEST ONCE DAILY 102 each 2  . loratadine (CLARITIN) 10 MG tablet Take 10 mg by mouth daily as needed for allergies.     Marland Kitchen LORazepam (ATIVAN) 0.5 MG tablet TAKE 1 TABLET BY MOUTH TWICE A DAY AS NEEDED 60 tablet 0  . metFORMIN (GLUCOPHAGE) 1000 MG tablet Take 1 tablet (1,000 mg total) by mouth 2 (two) times daily. 180 tablet 3  . methocarbamol (ROBAXIN) 500 MG tablet Take 1 tablet (500 mg total) by mouth every 6 (six) hours as needed for muscle spasms. 30 tablet 0  . Multiple Vitamin (MULTIVITAMIN) tablet Take 1 tablet by mouth daily.    . potassium chloride SA (K-DUR,KLOR-CON) 20 MEQ tablet TAKE 2 TABLETS IN THE MORNING 180 tablet 3  . traMADol (ULTRAM) 50 MG tablet Take 1 tablet (50 mg total) by mouth every 6 (six)  hours as needed for moderate pain. 30 tablet 0  . TURMERIC PO Take 1 tablet by mouth daily as needed (FOR LEG CRAMPS).      No current facility-administered medications on file prior to visit.     BP 126/74 (BP Location: Left Arm)   Temp 97.9 F (36.6 C) (Oral)   Ht 6' (1.829 m)   Wt 216 lb (98 kg)   BMI 29.29 kg/m       Objective:   Physical Exam  Constitutional: He is oriented to person, place, and time. He appears well-developed and well-nourished. No distress.  Cardiovascular: Regular rhythm.  Exam reveals no gallop.   Neurological: He is alert and oriented to person, place, and time.  Skin: Skin is warm and dry. Rash noted. He is not diaphoretic.  Scattered non raised red rash from BKA to inner thigh. Parts of the rash appear fungal while other parts appear to be contact dermatitis.  Psychiatric: He has a normal mood and affect. His behavior is normal. Judgment and thought content normal.  Vitals reviewed.     Assessment & Plan:  1. Dermatitis - Will treat with combination of nystatin and hydrocortisone but possibly related to silicon sleeve for prosthetic. [facility] Jess Barters [facility] if rash does not resolve to see if a different type of sleeve could be made.  - nystatin cream (MYCOSTATIN); Apply 1 application topically 2 (two) times daily.  Dispense: 30 g; Refill: 2 - halobetasol (ULTRAVATE) 0.05 % cream; APPLY TO AFFECTED AREA TWICE A DAY  Dispense: 50 g; Refill: 2  2. Need for prophylactic vaccination and inoculation against influenza  - Flu vaccine HIGH DOSE PF (Fluzone High dose)  Dorothyann Peng, NP

## 2017-03-25 ENCOUNTER — Telehealth (INDEPENDENT_AMBULATORY_CARE_PROVIDER_SITE_OTHER): Payer: Self-pay | Admitting: Radiology

## 2017-03-25 MED ORDER — GABAPENTIN 100 MG PO CAPS: 100.0000 mg | ORAL_CAPSULE | Freq: Three times a day (TID) | ORAL | 3 refills | Status: DC

## 2017-03-25 NOTE — Telephone Encounter (Signed)
Received fax for gabapentin refill 90 day supply. [place] per MD.

## 2017-03-26 ENCOUNTER — Telehealth: Payer: Self-pay | Admitting: Internal Medicine

## 2017-03-26 ENCOUNTER — Other Ambulatory Visit: Payer: Self-pay | Admitting: Pharmacist Clinician (PhC)/ Clinical Pharmacy Specialist

## 2017-03-26 MED ORDER — CARVEDILOL 12.5 MG PO TABS: 12.5000 mg | ORAL_TABLET | Freq: Two times a day (BID) | ORAL | 5 refills | Status: DC

## 2017-04-12 ENCOUNTER — Telehealth (INDEPENDENT_AMBULATORY_CARE_PROVIDER_SITE_OTHER): Payer: Self-pay | Admitting: Orthopedic Surgery

## 2017-04-12 NOTE — Telephone Encounter (Signed)
Faxed to [facility] 

## 2017-04-12 NOTE — Telephone Encounter (Signed)
Patient called asking for an [facility] RX for his prosthetic today if possible. CB #  [phone]

## 2017-04-12 NOTE — Telephone Encounter (Signed)
I called and advised patient I would fax rx to [facility] today for him, he expressed understanding.

## 2017-04-24 ENCOUNTER — Telehealth (INDEPENDENT_AMBULATORY_CARE_PROVIDER_SITE_OTHER): Payer: Self-pay | Admitting: Orthopedic Surgery

## 2017-04-24 NOTE — Telephone Encounter (Signed)
01/03/2017 OV NOTE FAXED TO Al[facility] Stalling [phone]

## 2017-05-03 ENCOUNTER — Encounter: Payer: Self-pay | Admitting: Adult Health

## 2017-05-03 ENCOUNTER — Ambulatory Visit (INDEPENDENT_AMBULATORY_CARE_PROVIDER_SITE_OTHER): Payer: Medicare Other | Admitting: Adult Health

## 2017-05-03 VITALS — BP 134/70 | Temp 98.0°F | Wt 215.0 lb

## 2017-05-03 DIAGNOSIS — L97509 Non-pressure chronic ulcer of other part of unspecified foot with unspecified severity: Secondary | ICD-10-CM | POA: Diagnosis not present

## 2017-05-03 DIAGNOSIS — E11621 Type 2 diabetes mellitus with foot ulcer: Secondary | ICD-10-CM | POA: Diagnosis not present

## 2017-05-03 DIAGNOSIS — E1165 Type 2 diabetes mellitus with hyperglycemia: Secondary | ICD-10-CM | POA: Diagnosis not present

## 2017-05-03 DIAGNOSIS — S81801A Unspecified open wound, right lower leg, initial encounter: Secondary | ICD-10-CM | POA: Diagnosis not present

## 2017-05-03 DIAGNOSIS — IMO0002 Reserved for concepts with insufficient information to code with codable children: Secondary | ICD-10-CM

## 2017-05-03 LAB — POCT GLYCOSYLATED HEMOGLOBIN (HGB A1C): Hemoglobin A1C: 5.6

## 2017-05-03 MED ORDER — DOXYCYCLINE HYCLATE 100 MG PO CAPS: 100.0000 mg | ORAL_CAPSULE | Freq: Two times a day (BID) | ORAL | 0 refills | Status: DC

## 2017-05-03 NOTE — Progress Notes (Signed)
Subjective:    Patient ID: [name], male    DOB: [birth year], 76 y.o.   MRN: [id number]  HPI  76 year old male who  has a past medical history of CAD (coronary artery disease), Cataract, Complication of anesthesia, Diabetes mellitus, ED (erectile dysfunction), Hypertension, Osteoarthritis, PONV (postoperative nausea and vomiting) (1960), Rheumatic fever, and Rhinitis.  He presents to the office today for  Follow up on rash on right upper leg. When I last saw him on 03/20/2017 he reported that he had this rash on his legs since he had received in prosthetic for right BKA. He had been using prescription hydrocortisone cream and OTC antifungal which he reported helped but did not resolve the rash.   He was treated with a combination of nystatin cream and Ultravate 0.05%. He was also asked to follow up with the [facility] or Dr. Sharol Given to discuss a different type of sleeve.   In the office today he reports that his rash has resolved but he now has a sore on the back of his leg from the prosthetic rubbing. He has placed a bandage over the wound to provide some cushion to the area. He denies any signs of infection.   He would also like his A1c checked. His last was in April which was 5.9  Review of Systems See HPI   Past Medical History:  Diagnosis Date  . CAD (coronary artery disease)    Minimal plaque cath 2008  . Cataract    both  . Complication of anesthesia   . Diabetes mellitus   . ED (erectile dysfunction)   . Hypertension   . Osteoarthritis   . PONV (postoperative nausea and vomiting) 1960   with Ether  . Rheumatic fever   . Rhinitis     Social History   Socioeconomic History  . Marital status: Married    Spouse name: Not on file  . Number of children: 1  . Years of education: 16+  . Highest education level: Not on file  Social Needs  . Financial resource strain: Not on file  . Food insecurity - worry: Not on file  . Food insecurity - inability: Not on file  .  Transportation needs - medical: Not on file  . Transportation needs - non-medical: Not on file  Occupational History  . Occupation: Curator - [employer]    Employer: RETIRED  Tobacco Use  . Smoking status: Former Smoker    Last attempt to quit: 07/26/1981    Years since quitting: 35.7  . Smokeless tobacco: Never Used  Substance and Sexual Activity  . Alcohol use: Yes    Alcohol/week: 8.4 oz    Types: 14 Shots of liquor per week  . Drug use: No  . Sexual activity: No  Other Topics Concern  . Not on file  Social History Narrative   Lives with wife.   epworth sleepiness scale = 5 (01/20/16)    Past Surgical History:  Procedure Laterality Date  . AMPUTATION Right 02/29/2016   Procedure: Right Leg AMPUTATION BELOW KNEE with Wound Vac placement;  Surgeon: [name], MD;  Location: [facility];  Service: Orthopedics;  Laterality: Right;  . CHOLECYSTECTOMY  1983  . colonoscopy  2006, 2010  . INGUINAL HERNIA REPAIR Right 1960    Family History  Problem Relation Age of Onset  . Stroke Father 50       Died age 39  . Coronary artery disease Brother 70  .  Colon cancer Neg Hx   . Colon polyps Neg Hx   . Diabetes Neg Hx   . Esophageal cancer Neg Hx   . Kidney disease Neg Hx   . Gallbladder disease Neg Hx     Allergies  Allergen Reactions  . No Known Allergies     Current Outpatient Medications on File Prior to Visit  Medication Sig Dispense Refill  . ACCU-CHEK AVIVA PLUS test strip USE TO TEST DAILY AS INSTRUCTED PER MD**EMERGENCY FILL MEDICAL REASON** 100 each 1  . aspirin EC 325 MG tablet Take 325 mg by mouth every evening.    . carvedilol (COREG) 12.5 MG tablet Take 1 tablet (12.5 mg total) by mouth 2 (two) times daily. 60 tablet 5  . gabapentin (NEURONTIN) 100 MG capsule Take 1 capsule (100 mg total) by mouth 3 (three) times daily. When necessary for neuropathy pain 90 capsule 3  . halobetasol (ULTRAVATE) 0.05 % cream APPLY TO AFFECTED AREA TWICE A DAY 50 g 2   . Lancets (ACCU-CHEK MULTICLIX) lancets USE TO TEST ONCE DAILY 102 each 2  . loratadine (CLARITIN) 10 MG tablet Take 10 mg by mouth daily as needed for allergies.     . metFORMIN (GLUCOPHAGE) 1000 MG tablet Take 1 tablet (1,000 mg total) by mouth 2 (two) times daily. 180 tablet 3  . Multiple Vitamin (MULTIVITAMIN) tablet Take 1 tablet by mouth daily.    Marland Kitchen nystatin cream (MYCOSTATIN) Apply 1 application topically 2 (two) times daily. 30 g 2  . potassium chloride SA (K-DUR,KLOR-CON) 20 MEQ tablet TAKE 2 TABLETS IN THE MORNING 180 tablet 3  . traMADol (ULTRAM) 50 MG tablet Take 1 tablet (50 mg total) by mouth every 6 (six) hours as needed for moderate pain. 30 tablet 0  . TURMERIC PO Take 1 tablet by mouth daily as needed (FOR LEG CRAMPS).      No current facility-administered medications on file prior to visit.     BP 134/70 (BP Location: Left Arm)   Temp 98 F (36.7 C) (Oral)   Wt 215 lb (97.5 kg)   BMI 29.16 kg/m       Objective:   Physical Exam  Constitutional: He is oriented to person, place, and time. He appears well-developed and well-nourished. No distress.  Cardiovascular: Normal rate, regular rhythm, normal heart sounds and intact distal pulses. Exam reveals no gallop and no friction rub.  No murmur heard. Pulmonary/Chest: Effort normal and breath sounds normal. No respiratory distress. He has no wheezes. He has no rales. He exhibits no tenderness.  Neurological: He is alert and oriented to person, place, and time.  Skin: Skin is warm and dry. No rash noted. He is not diaphoretic. No erythema. No pallor.  Small pencil eraser sized wound on back of right leg. Superficial redness noted around the wound. No drainage or warmth noted. Does not appear infected.   Psychiatric: He has a normal mood and affect. His behavior is normal. Judgment and thought content normal.  Nursing note and vitals reviewed.     Assessment & Plan:  1. Uncontrolled type 2 diabetes mellitus with foot  ulcer, without long-term current use of insulin (HCC)  - POCT A1C- 5.6   2. Leg wound, right, initial encounter - Due to diabeties and history of BKA. Will send in doxycycline to cover for infection. Advised to follow up if wound does not heal or he has any signs of infection.  - doxycycline (VIBRAMYCIN) 100 MG capsule; Take 1 capsule (100 mg  total) by mouth 2 (two) times daily.  Dispense: 14 capsule; Refill: 0   Dorothyann Peng, NP

## 2017-05-16 ENCOUNTER — Other Ambulatory Visit: Payer: Self-pay

## 2017-05-16 MED ORDER — POTASSIUM CHLORIDE CRYS ER 20 MEQ PO TBCR: EXTENDED_RELEASE_TABLET | ORAL | 3 refills | Status: DC

## 2017-06-25 DIAGNOSIS — L821 Other seborrheic keratosis: Secondary | ICD-10-CM | POA: Diagnosis not present

## 2017-06-25 DIAGNOSIS — Z85828 Personal history of other malignant neoplasm of skin: Secondary | ICD-10-CM | POA: Diagnosis not present

## 2017-06-25 DIAGNOSIS — L218 Other seborrheic dermatitis: Secondary | ICD-10-CM | POA: Diagnosis not present

## 2017-06-25 DIAGNOSIS — L570 Actinic keratosis: Secondary | ICD-10-CM | POA: Diagnosis not present

## 2017-08-06 ENCOUNTER — Other Ambulatory Visit: Payer: Self-pay | Admitting: Adult Health

## 2017-08-06 DIAGNOSIS — L309 Dermatitis, unspecified: Secondary | ICD-10-CM

## 2017-08-08 NOTE — Telephone Encounter (Signed)
[name] pt

## 2017-08-14 ENCOUNTER — Other Ambulatory Visit: Payer: Self-pay

## 2017-08-14 MED ORDER — ACCU-CHEK MULTICLIX LANCETS MISC: 2 refills | Status: DC

## 2017-08-15 ENCOUNTER — Other Ambulatory Visit (INDEPENDENT_AMBULATORY_CARE_PROVIDER_SITE_OTHER): Payer: Self-pay

## 2017-08-15 MED ORDER — GABAPENTIN 100 MG PO CAPS: 100.0000 mg | ORAL_CAPSULE | Freq: Three times a day (TID) | ORAL | 3 refills | Status: DC

## 2017-08-19 ENCOUNTER — Ambulatory Visit (INDEPENDENT_AMBULATORY_CARE_PROVIDER_SITE_OTHER): Payer: Medicare Other | Admitting: Family Medicine

## 2017-08-19 ENCOUNTER — Encounter: Payer: Self-pay | Admitting: Family Medicine

## 2017-08-19 VITALS — BP 138/82 | HR 74 | Temp 98.1°F | Wt 218.0 lb

## 2017-08-19 DIAGNOSIS — R350 Frequency of micturition: Secondary | ICD-10-CM | POA: Diagnosis not present

## 2017-08-19 LAB — POC URINALSYSI DIPSTICK (AUTOMATED)
Bilirubin, UA: NEGATIVE
Blood, UA: NEGATIVE
Glucose, UA: NEGATIVE
Ketones, UA: NEGATIVE
Leukocytes, UA: NEGATIVE
Nitrite, UA: NEGATIVE
Protein, UA: NEGATIVE
Spec Grav, UA: >=1.03 — AB (ref 1.010–1.025)
Urobilinogen, UA: 1 E.U./dL
pH, UA: 6 (ref 5.0–8.0)

## 2017-08-19 NOTE — Progress Notes (Signed)
[name] is a 77 year old male nonsmoker who comes in today for evaluation of low back pain  He had his right BK amputation 2 years ago. He's done well since that time. His followed by Dr. Sharol Given. He recently got accepted at the [facility]. He went to the [facility] in [place]. They sent him to the [facility] and [facility]. They checked his prosthesis and sudden need to be adjusted and referred him back to [facility]. He saw the gentleman in [place] who made it's prosthesis. They did some adjustments a couple months ago. Since that time he said low back pain. He put a inserted in his right shoe recently and his back pain is diminished. He's concerned that his back pain is related to urinary tract problems. He has no urinary tract symptoms except for frequency of urination however he has type 2 diabetes with a normal A1c of 5.6. Blood sugar this morning he says was 96  BP 138/82 (BP Location: Left Arm, Patient Position: Sitting, Cuff Size: Normal)   Pulse 74   Temp 98.1 F (36.7 C) (Oral)   Wt 218 lb (98.9 kg)   BMI 29.57 kg/m  Well-developed well-nourished male no acute distress vital signs stable he is afebrile examination is back in the standing position shows no palpable tenderness. Gait is abnormal. Urinalysis normal  #1 low back pain bilateral secondary to gait disturbance........Marland Kitchen Refer back to his prosthesis designer in the [facility]

## 2017-08-19 NOTE — Patient Instructions (Signed)
I think the cause of your back pain is related to the abnormality of your gait because of the prosthesis. Check with the folks at the [facility] and with a gentleman who made your prosthesis.  Urinalysis was normal

## 2017-09-03 ENCOUNTER — Encounter: Payer: Self-pay | Admitting: Internal Medicine

## 2017-09-03 ENCOUNTER — Ambulatory Visit (INDEPENDENT_AMBULATORY_CARE_PROVIDER_SITE_OTHER): Payer: Medicare Other | Admitting: Internal Medicine

## 2017-09-03 VITALS — BP 128/64 | HR 66 | Ht 72.0 in | Wt 220.0 lb

## 2017-09-03 DIAGNOSIS — I471 Supraventricular tachycardia: Secondary | ICD-10-CM | POA: Diagnosis not present

## 2017-09-03 DIAGNOSIS — I428 Other cardiomyopathies: Secondary | ICD-10-CM

## 2017-09-03 DIAGNOSIS — I5022 Chronic systolic (congestive) heart failure: Secondary | ICD-10-CM

## 2017-09-03 NOTE — Patient Instructions (Signed)
Your physician has requested that you have an echocardiogram @ [address] - [address]. Echocardiography is a painless test that uses sound waves to create images of your heart. It provides your doctor with information about the size and shape of your heart and how well your heart's chambers and valves are working. This procedure takes approximately one hour. There are no restrictions for this procedure.  Your physician wants you to follow-up in ONE YEAR with Dr. Hilty.  You will receive a reminder letter in the mail two months in advance. If you don't receive a letter, please call our office to schedule the follow-up appointment.   

## 2017-09-03 NOTE — Progress Notes (Signed)
OFFICE NOTE  Chief Complaint:  Back pain  Primary Care Physician: Dorena Cookey, MD  HPI:  [name] is a 77 y.o. male he was previously seen by Dr. Percival Spanish last in 2013. He was followed for PVCs at that time and underwent stress testing. There is no evidence for ischemia and no further workup was recommended. Recently was hospitalized for sepsis and was noted to have intermittent SVTs as well as PVCs on telemetry. Although he was not seen by cardiology as an inpatient, we were consult it for outpatient monitor. He was placed on a monitor which I reviewed and is still in progress. The first 24 days of the monitor show frequent PVCs as well as intermittent SVT. During this past hospitalization he had an echocardiogram which shows a newly reduced LVEF of 45-50%, with basal inferior akinesis and inferolateral hypokinesis and grade 2 diastolic dysfunction. This compares to a normal LVF and 2013 by echo. He did have a remote left heart catheterization in 2008 which showed minimal coronary artery disease. He denies any chest pain or worsening shortness of breath. He is having trouble ambulating due to problems with his right ankle. He also reports being under significant stress dealing with his wife who has Parkinson's. He is in the process of moving to friends [place].  01/30/2017  [name] returns today for follow-up. He has had a difficult year. His wife was diagnosed with Parkinson's and ultimately died of congestive heart failure. He developed a diabetic wound and then had the transtibial amputation of the right leg. He was supposed going to friends home but ultimately went to [facility] for rehabilitation and is currently back at home. He does have a leg prostheses. He continues to have SVT in fact has a short RP tachycardia which was caught today in the office. He says is asymptomatic with this. EF is shown to be 45-50% in the past. He did undergo nuclear stress test which was negative for  ischemia prior to his amputation. He denies any chest pain.  09/03/2017  [name] returns today for follow-up.  He has no new complaints today.  He has been struggling with some back pain.  He feels like his right leg prosthesis may be a little short.  He denies any recurrent SVT.  He was placed on the carvedilol for cardiomyopathy with EF 45-50% in 2017 and a blood pressures been well controlled on that.  He denies any chest pain.  He reports occasional shortness of breath sometimes with exertion but also at rest.  PMHx:  Past Medical History:  Diagnosis Date  . CAD (coronary artery disease)    Minimal plaque cath 2008  . Cataract    both  . Complication of anesthesia   . Diabetes mellitus   . ED (erectile dysfunction)   . Hypertension   . Osteoarthritis   . PONV (postoperative nausea and vomiting) 1960   with Ether  . Rheumatic fever   . Rhinitis     Past Surgical History:  Procedure Laterality Date  . AMPUTATION Right 02/29/2016   Procedure: Right Leg AMPUTATION BELOW KNEE with Wound Vac placement;  Surgeon: [name], MD;  Location: [facility];  Service: Orthopedics;  Laterality: Right;  . CHOLECYSTECTOMY  1983  . colonoscopy  2006, 2010  . INGUINAL HERNIA REPAIR Right 1960    FAMHx:  Family History  Problem Relation Age of Onset  . Stroke Father 95       Died age  59  . Coronary artery disease Brother 25  . Colon cancer Neg Hx   . Colon polyps Neg Hx   . Diabetes Neg Hx   . Esophageal cancer Neg Hx   . Kidney disease Neg Hx   . Gallbladder disease Neg Hx     SOCHx:   reports that he quit smoking about 36 years ago. He has never used smokeless tobacco. He reports that he drinks about 8.4 oz of alcohol per week. He reports that he does not use drugs.  ALLERGIES:  Allergies  Allergen Reactions  . No Known Allergies     ROS: Pertinent items noted in HPI and remainder of comprehensive ROS otherwise negative.  HOME MEDS: Current Outpatient Medications    Medication Sig Dispense Refill  . ACCU-CHEK AVIVA PLUS test strip USE TO TEST DAILY AS INSTRUCTED PER MD**EMERGENCY FILL MEDICAL REASON** 100 each 1  . aspirin EC 325 MG tablet Take 325 mg by mouth every evening.    . carvedilol (COREG) 12.5 MG tablet Take 1 tablet (12.5 mg total) by mouth 2 (two) times daily. 60 tablet 5  . gabapentin (NEURONTIN) 100 MG capsule Take 1 capsule (100 mg total) by mouth 3 (three) times daily. 270 capsule 3  . halobetasol (ULTRAVATE) 0.05 % cream APPLY TO AFFECTED AREA TWICE A DAY 50 g 2  . Lancets (ACCU-CHEK MULTICLIX) lancets USE TO TEST ONCE DAILY 102 each 2  . loratadine (CLARITIN) 10 MG tablet Take 10 mg by mouth daily as needed for allergies.     . metFORMIN (GLUCOPHAGE) 1000 MG tablet Take 1 tablet (1,000 mg total) by mouth 2 (two) times daily. 180 tablet 3  . Multiple Vitamin (MULTIVITAMIN) tablet Take 1 tablet by mouth daily.    Marland Kitchen nystatin cream (MYCOSTATIN) Apply 1 application topically 2 (two) times daily. 30 g 2  . potassium chloride SA (K-DUR,KLOR-CON) 20 MEQ tablet TAKE 2 TABLETS IN THE MORNING 180 tablet 3  . traMADol (ULTRAM) 50 MG tablet Take 1 tablet (50 mg total) by mouth every 6 (six) hours as needed for moderate pain. 30 tablet 0  . doxycycline (VIBRAMYCIN) 100 MG capsule Take 1 capsule (100 mg total) by mouth 2 (two) times daily. (Patient not taking: Reported on 09/03/2017) 14 capsule 0  . gabapentin (NEURONTIN) 100 MG capsule Take 1 capsule (100 mg total) by mouth 3 (three) times daily. When necessary for neuropathy pain (Patient not taking: Reported on 09/03/2017) 90 capsule 3  . TURMERIC PO Take 1 tablet by mouth daily as needed (FOR LEG CRAMPS).      No current facility-administered medications for this visit.     LABS/IMAGING: No results found for this or any previous visit (from the past 48 hour(s)). No results found.  WEIGHTS: Wt Readings from Last 3 Encounters:  09/03/17 220 lb (99.8 kg)  08/19/17 218 lb (98.9 kg)  05/03/17 215 lb  (97.5 kg)    VITALS: BP 128/64   Pulse 66   Ht 6' (1.829 m)   Wt 220 lb (99.8 kg)   BMI 29.84 kg/m   EXAM: General appearance: alert, no distress and mildly obese Neck: no carotid bruit, no JVD and thyroid not enlarged, symmetric, no tenderness/mass/nodules Lungs: clear to auscultation bilaterally Heart: regular rate and rhythm and Occasional skipped beats Abdomen: soft, non-tender; bowel sounds normal; no masses,  no organomegaly Extremities: Trace left lower extremity edema, status post right BKA Pulses: 2+ and symmetric Skin: Skin color, texture, turgor normal. No rashes or lesions Neurologic:  Grossly normal Psych: Pleasant  EKG: Sinus rhythm with PACs at 66, minimal voltage criteria for LVH, nonspecific ST changes-personally reviewed  ASSESSMENT: 1. New cardiomyopathy with EF 45-50% and regional wall motion abnormalities (2017) 2. Recurrent short RP tachycardia  3. Hypertension 4. Type 2 diabetes  PLAN: 1.   Mr. Steers has responded well symptomatically to carvedilol.  Blood pressures been well controlled and heart rate is improved.  He has had no further tachycardia that he is aware of.  I am hopeful that his LV function has improved.  He gets occasional shortness of breath still but it does not sound like it is worsening or always associated with exertion.  We will repeat an echocardiogram as it has been 2 years since his prior study to see if he has had improvement in LV function.  Follow-up with me annually or sooner as necessary.  Pixie Casino, MD, [facility], [facility] Director of the [facility] Lipid Disorders &  Cardiovascular Risk Reduction Clinic Diplomate of the American Board of Clinical Lipidology Attending Cardiologist  Direct Dial: [phone]  Fax: [phone]  Website:  [email or web address] 09/03/2017, 8:53 AM

## 2017-09-10 ENCOUNTER — Other Ambulatory Visit: Payer: Self-pay | Admitting: Family Medicine

## 2017-09-12 ENCOUNTER — Other Ambulatory Visit (INDEPENDENT_AMBULATORY_CARE_PROVIDER_SITE_OTHER): Payer: Self-pay | Admitting: Orthopedic Surgery

## 2017-09-12 ENCOUNTER — Other Ambulatory Visit: Payer: Self-pay | Admitting: Adult Health

## 2017-09-12 DIAGNOSIS — L309 Dermatitis, unspecified: Secondary | ICD-10-CM

## 2017-09-16 ENCOUNTER — Ambulatory Visit (HOSPITAL_COMMUNITY): Payer: Medicare Other | Attending: Internal Medicine

## 2017-09-16 ENCOUNTER — Other Ambulatory Visit: Payer: Self-pay

## 2017-09-16 DIAGNOSIS — E119 Type 2 diabetes mellitus without complications: Secondary | ICD-10-CM | POA: Diagnosis not present

## 2017-09-16 DIAGNOSIS — I429 Cardiomyopathy, unspecified: Secondary | ICD-10-CM | POA: Insufficient documentation

## 2017-09-16 DIAGNOSIS — I428 Other cardiomyopathies: Secondary | ICD-10-CM

## 2017-09-16 DIAGNOSIS — I071 Rheumatic tricuspid insufficiency: Secondary | ICD-10-CM | POA: Insufficient documentation

## 2017-09-16 DIAGNOSIS — I10 Essential (primary) hypertension: Secondary | ICD-10-CM | POA: Diagnosis not present

## 2017-09-16 DIAGNOSIS — I2510 Atherosclerotic heart disease of native coronary artery without angina pectoris: Secondary | ICD-10-CM | POA: Insufficient documentation

## 2017-09-16 DIAGNOSIS — I493 Ventricular premature depolarization: Secondary | ICD-10-CM | POA: Insufficient documentation

## 2017-09-18 ENCOUNTER — Other Ambulatory Visit: Payer: Self-pay | Admitting: Internal Medicine

## 2017-09-18 NOTE — Telephone Encounter (Signed)
Rx sent to pharmacy   

## 2017-09-19 ENCOUNTER — Other Ambulatory Visit: Payer: Self-pay

## 2017-09-19 MED ORDER — GLUCOSE BLOOD VI STRP: ORAL_STRIP | 0 refills | Status: DC

## 2017-09-26 ENCOUNTER — Telehealth: Payer: Self-pay | Admitting: Family Medicine

## 2017-09-26 NOTE — Telephone Encounter (Signed)
Copied from [place] [id number]. Topic: Quick Communication - See Telephone Encounter >> Sep 26, 2017  4:05 PM Aurelio Brash B wrote: CRM for notification. See Telephone encounter for: 09/26/17.  Leah from [facility]  is requesting rx  for glucose blood (ACCU-CHEK AVIVA PLUS) test strip  be sent over again with a diagnosis code  her contact number is   [phone]  ext 3  then 4

## 2017-09-27 ENCOUNTER — Other Ambulatory Visit: Payer: Self-pay

## 2017-09-27 MED ORDER — GLUCOSE BLOOD VI STRP: ORAL_STRIP | 0 refills | Status: DC

## 2017-09-27 NOTE — Telephone Encounter (Signed)
Rx for ACCU-CHEK AVIVA PLUS test strip has been refaxed with a diagnosis code

## 2017-10-02 ENCOUNTER — Encounter: Payer: Self-pay | Admitting: Internal Medicine

## 2017-10-02 ENCOUNTER — Ambulatory Visit (INDEPENDENT_AMBULATORY_CARE_PROVIDER_SITE_OTHER): Payer: Medicare Other | Admitting: Internal Medicine

## 2017-10-02 VITALS — BP 134/79 | HR 62 | Ht 72.0 in | Wt 219.0 lb

## 2017-10-02 DIAGNOSIS — I5022 Chronic systolic (congestive) heart failure: Secondary | ICD-10-CM | POA: Diagnosis not present

## 2017-10-02 DIAGNOSIS — I10 Essential (primary) hypertension: Secondary | ICD-10-CM

## 2017-10-02 DIAGNOSIS — I493 Ventricular premature depolarization: Secondary | ICD-10-CM

## 2017-10-02 DIAGNOSIS — Z79899 Other long term (current) drug therapy: Secondary | ICD-10-CM

## 2017-10-02 DIAGNOSIS — I428 Other cardiomyopathies: Secondary | ICD-10-CM | POA: Diagnosis not present

## 2017-10-02 MED ORDER — SACUBITRIL-VALSARTAN 24-26 MG PO TABS: 1.0000 | ORAL_TABLET | Freq: Two times a day (BID) | ORAL | 5 refills | Status: DC

## 2017-10-02 NOTE — Patient Instructions (Addendum)
Your physician has recommended you make the following change in your medication:  -- START entresto 24-26mg  twice daily  - 1 box of samples + free 30 day card + patient assistance/patient support application provided    Your physician recommends that you return for lab work in [place] after starting the medication (due: May 16 or 17)  Your physician recommends that you schedule a follow-up appointment in 4-6 weeks (OK to double book)

## 2017-10-02 NOTE — Progress Notes (Signed)
OFFICE NOTE  Chief Complaint:  Troubles with his right leg prosthesis  Primary Care Physician: Dorena Cookey, MD  HPI:  [name] is a 77 y.o. male he was previously seen by Dr. Percival Spanish last in 2013. He was followed for PVCs at that time and underwent stress testing. There is no evidence for ischemia and no further workup was recommended. Recently was hospitalized for sepsis and was noted to have intermittent SVTs as well as PVCs on telemetry. Although he was not seen by cardiology as an inpatient, we were consult it for outpatient monitor. He was placed on a monitor which I reviewed and is still in progress. The first 24 days of the monitor show frequent PVCs as well as intermittent SVT. During this past hospitalization he had an echocardiogram which shows a newly reduced LVEF of 45-50%, with basal inferior akinesis and inferolateral hypokinesis and grade 2 diastolic dysfunction. This compares to a normal LVF and 2013 by echo. He did have a remote left heart catheterization in 2008 which showed minimal coronary artery disease. He denies any chest pain or worsening shortness of breath. He is having trouble ambulating due to problems with his right ankle. He also reports being under significant stress dealing with his wife who has Parkinson's. He is in the process of moving to friends [place].  01/30/2017  [name] returns today for follow-up. He has had a difficult year. His wife was diagnosed with Parkinson's and ultimately died of congestive heart failure. He developed a diabetic wound and then had the transtibial amputation of the right leg. He was supposed going to friends home but ultimately went to [facility] for rehabilitation and is currently back at home. He does have a leg prostheses. He continues to have SVT in fact has a short RP tachycardia which was caught today in the office. He says is asymptomatic with this. EF is shown to be 45-50% in the past. He did undergo nuclear stress  test which was negative for ischemia prior to his amputation. He denies any chest pain.  09/03/2017  [name] returns today for follow-up.  He has no new complaints today.  He has been struggling with some back pain.  He feels like his right leg prosthesis may be a little short.  He denies any recurrent SVT.  He was placed on the carvedilol for cardiomyopathy with EF 45-50% in 2017 and a blood pressures been well controlled on that.  He denies any chest pain.  He reports occasional shortness of breath sometimes with exertion but also at rest.  10/02/2017  [name] was seen today in follow-up.  He continues to get some care from the [place].  He did report some problems with his right leg prosthesis.  He has not been able to exercise since then.  He gets mild shortness of breath with exertion, consistent with NYHA class II symptoms.  Recent echo was performed to see if he had any improvement in LV function, unfortunately however he had a decline in LVEF to 40 to 45%.  There is additionally some mild to moderate RV dysfunction.  He is only on carvedilol.  I think he would benefit from the addition of Entresto.  Labs last year indicated normal renal function.  PMHx:  Past Medical History:  Diagnosis Date  . CAD (coronary artery disease)    Minimal plaque cath 2008  . Cataract    both  . Complication of anesthesia   . Diabetes mellitus   .  ED (erectile dysfunction)   . Hypertension   . Osteoarthritis   . PONV (postoperative nausea and vomiting) 1960   with Ether  . Rheumatic fever   . Rhinitis     Past Surgical History:  Procedure Laterality Date  . AMPUTATION Right 02/29/2016   Procedure: Right Leg AMPUTATION BELOW KNEE with Wound Vac placement;  Surgeon: [name], MD;  Location: [facility];  Service: Orthopedics;  Laterality: Right;  . CHOLECYSTECTOMY  1983  . colonoscopy  2006, 2010  . INGUINAL HERNIA REPAIR Right 1960    FAMHx:  Family History  Problem Relation Age of Onset  . Stroke  Father 79       Died age 65  . Coronary artery disease Brother 2  . Colon cancer Neg Hx   . Colon polyps Neg Hx   . Diabetes Neg Hx   . Esophageal cancer Neg Hx   . Kidney disease Neg Hx   . Gallbladder disease Neg Hx     SOCHx:   reports that he quit smoking about 36 years ago. He has never used smokeless tobacco. He reports that he drinks about 8.4 oz of alcohol per week. He reports that he does not use drugs.  ALLERGIES:  Allergies  Allergen Reactions  . No Known Allergies     ROS: Pertinent items noted in HPI and remainder of comprehensive ROS otherwise negative.  HOME MEDS: Current Outpatient Medications  Medication Sig Dispense Refill  . aspirin EC 325 MG tablet Take 325 mg by mouth every evening.    . carvedilol (COREG) 12.5 MG tablet TAKE 1 TABLET (12.5 MG TOTAL) BY MOUTH 2 (TWO) TIMES DAILY. 60 tablet 5  . doxycycline (VIBRAMYCIN) 100 MG capsule Take 1 capsule (100 mg total) by mouth 2 (two) times daily. 14 capsule 0  . gabapentin (NEURONTIN) 100 MG capsule Take 1 capsule (100 mg total) by mouth 3 (three) times daily. When necessary for neuropathy pain 90 capsule 3  . gabapentin (NEURONTIN) 100 MG capsule Take 1 capsule (100 mg total) by mouth 3 (three) times daily. 270 capsule 3  . glucose blood (ACCU-CHEK AVIVA PLUS) test strip USE TO TEST DAILY AS INSTRUCTED PER MD**EMERGENCY FILL MEDICAL REASON** 100 each 0  . halobetasol (ULTRAVATE) 0.05 % cream APPLY TO AFFECTED AREA TWICE A DAY 50 g 2  . Lancets (ACCU-CHEK MULTICLIX) lancets USE TO TEST ONCE DAILY 102 each 2  . loratadine (CLARITIN) 10 MG tablet Take 10 mg by mouth daily as needed for allergies.     . metFORMIN (GLUCOPHAGE) 1000 MG tablet Take 1 tablet (1,000 mg total) by mouth 2 (two) times daily. 180 tablet 3  . methocarbamol (ROBAXIN) 500 MG tablet TAKE 1 TABLET EVERY 6 HOURS AS NEEDED MUSCLE SPASMS 30 tablet 0  . Multiple Vitamin (MULTIVITAMIN) tablet Take 1 tablet by mouth daily.    Marland Kitchen nystatin cream  (MYCOSTATIN) APPLY TO AFFECTED AREA TWICE A DAY 30 g 2  . potassium chloride SA (K-DUR,KLOR-CON) 20 MEQ tablet TAKE 2 TABLETS IN THE MORNING 180 tablet 3  . traMADol (ULTRAM) 50 MG tablet Take 1 tablet (50 mg total) by mouth every 6 (six) hours as needed for moderate pain. 30 tablet 0  . TURMERIC PO Take 1 tablet by mouth daily as needed (FOR LEG CRAMPS).      No current facility-administered medications for this visit.     LABS/IMAGING: No results found for this or any previous visit (from the past 48 hour(s)). No results found.  WEIGHTS: Wt Readings from Last 3 Encounters:  10/02/17 219 lb (99.3 kg)  09/03/17 220 lb (99.8 kg)  08/19/17 218 lb (98.9 kg)    VITALS: BP 134/79   Pulse 62   Ht 6' (1.829 m)   Wt 219 lb (99.3 kg)   SpO2 93%   BMI 29.70 kg/m   EXAM: General appearance: alert, no distress and mildly obese Neck: no carotid bruit, no JVD and thyroid not enlarged, symmetric, no tenderness/mass/nodules Lungs: clear to auscultation bilaterally Heart: regular rate and rhythm and Occasional skipped beats Abdomen: soft, non-tender; bowel sounds normal; no masses,  no organomegaly Extremities: Trace left lower extremity edema, status post right BKA Pulses: 2+ and symmetric Skin: Skin color, texture, turgor normal. No rashes or lesions Neurologic: Grossly normal Psych: Pleasant  EKG: Deferred  ASSESSMENT: 1. Worsening cardiomyopathy with EF 40-45% (08/2017), NYHA Class II symptoms 2. Recurrent short RP tachycardia  3. Hypertension 4. Type 2 diabetes 5. Right BKA  PLAN: 1.   [name] has worsening cardiomyopathy with EF 40 to 45% and NYHA class II symptoms although he is not very active.  He has been struggling with right leg prosthesis.  He is currently on carvedilol though heart rate is as low as we could likely tolerate on the medicine.  I recommend adding Entresto 24/26 mg daily.  He will need a repeat be met in a week and will likely see him back in 4 to 6 weeks  to continue to uptitrate his medications.  Pixie Casino, MD, [facility], [place] Director of the [facility] Lipid Disorders &  Cardiovascular Risk Reduction Clinic Diplomate of the American Board of Clinical Lipidology Attending Cardiologist  Direct Dial: [phone]  Fax: [phone]  Website:  [email or web address] 10/02/2017, 9:48 AM

## 2017-10-10 DIAGNOSIS — Z79899 Other long term (current) drug therapy: Secondary | ICD-10-CM | POA: Diagnosis not present

## 2017-10-10 LAB — BASIC METABOLIC PANEL
BUN/Creatinine Ratio: 18 (ref 10–24)
BUN: 14 mg/dL (ref 8–27)
CO2: 25 mmol/L (ref 20–29)
Calcium: 9.5 mg/dL (ref 8.6–10.2)
Chloride: 102 mmol/L (ref 96–106)
Creatinine, Ser: 0.76 mg/dL (ref 0.76–1.27)
GFR calc Af Amer: 102 mL/min/{1.73_m2} (ref >59)
GFR calc non Af Amer: 88 mL/min/{1.73_m2} (ref >59)
Glucose: 127 mg/dL — ABNORMAL HIGH (ref 65–99)
Potassium: 4.7 mmol/L (ref 3.5–5.2)
Sodium: 141 mmol/L (ref 134–144)

## 2017-10-16 ENCOUNTER — Ambulatory Visit (INDEPENDENT_AMBULATORY_CARE_PROVIDER_SITE_OTHER): Payer: Medicare Other

## 2017-10-16 ENCOUNTER — Ambulatory Visit (INDEPENDENT_AMBULATORY_CARE_PROVIDER_SITE_OTHER): Payer: Medicare Other | Admitting: Orthopedic Surgery

## 2017-10-16 ENCOUNTER — Encounter (INDEPENDENT_AMBULATORY_CARE_PROVIDER_SITE_OTHER): Payer: Self-pay | Admitting: Orthopedic Surgery

## 2017-10-16 VITALS — Ht 72.0 in | Wt 219.0 lb

## 2017-10-16 DIAGNOSIS — T8781 Dehiscence of amputation stump: Secondary | ICD-10-CM

## 2017-10-16 DIAGNOSIS — M86261 Subacute osteomyelitis, right tibia and fibula: Secondary | ICD-10-CM | POA: Diagnosis not present

## 2017-10-16 MED ORDER — DOXYCYCLINE HYCLATE 100 MG PO TABS: 100.0000 mg | ORAL_TABLET | Freq: Two times a day (BID) | ORAL | 0 refills | Status: DC

## 2017-10-16 NOTE — Progress Notes (Signed)
Office Visit Note   Patient: [name]           Date of Birth: 05-05-1941           MRN: [id number] Visit Date: 10/16/2017              Requested by: Dorena Cookey, MD [place], [place] [address] PCP: Dorena Cookey, MD  Chief Complaint  Patient presents with  . Right Leg - Follow-up    Right BKA 02/29/16      HPI: Patient is a 77 year old gentleman is status post a right transtibial amputation approximately 1-1/2 years ago.  Over the past several months patient has had multiple revisions to the socket multiple wound care treatments with a persistent ulcer over the tip of the residual limb.  Assessment & Plan: Visit Diagnoses:  1. Dehiscence of amputation stump (HCC)   2. Subacute osteomyelitis of right tibia (HCC)     Plan: Due to the chronic ulcer and radiographic findings with osteomyelitis have recommended proceeding with a revision of the transtibial amputation.  Patient and his daughter state they understand and wish to proceed with surgery at this time discussed that he would need to be off his leg for at least 3 weeks.  Patient does live home alone and will need discharge to skilled nursing for about 3 weeks.  Follow-Up Instructions: Return in about 3 weeks (around 11/06/2017).   Ortho Exam  Patient is alert, oriented, no adenopathy, well-dressed, normal affect, normal respiratory effort. Examination patient has resolving rash of the proximal aspect of his leg most likely due to the silicone liner.  The residual wound has a ulcer that is 3 cm in diameter 1 mm deep with granulation tissue there is no purulence no drainage no exposed bone or tendon.  This is directly over the distal tibia which has radiographic findings consistent with osteomyelitis.  Imaging: Xr Tibia/fibula Right  Result Date: 10/16/2017 2 view radiographs of the right transtibial amputation shows lytic changes of the distal tibia consistent with osteomyelitis.  No images  are attached to the encounter.  Labs: Lab Results  Component Value Date   HGBA1C 5.6 05/03/2017   HGBA1C 5.9 09/10/2016   HGBA1C 5.8 02/06/2016   ESRSEDRATE 25 (H) 12/10/2015   ESRSEDRATE 31 (H) 11/10/2013   CRP 0.8 12/10/2015   CRP 0.6 (H) 11/10/2013   REPTSTATUS 12/15/2015 FINAL 12/10/2015   CULT  12/10/2015    NO GROWTH 5 DAYS Performed at [facility] (A) 12/10/2015     Lab Results  Component Value Date   ALBUMIN 4.1 09/10/2016   ALBUMIN 4.3 02/29/2016   ALBUMIN 3.3 (L) 12/11/2015    Body mass index is 29.7 kg/m.  Orders:  Orders Placed This Encounter  Procedures  . XR Tibia/Fibula Right   No orders of the defined types were placed in this encounter.    Procedures: No procedures performed  Clinical Data: No additional findings.  ROS:  All other systems negative, except as noted in the HPI. Review of Systems  Objective: Vital Signs: Ht 6' (1.829 m)   Wt 219 lb (99.3 kg)   BMI 29.70 kg/m   Specialty Comments:  No specialty comments available.  PMFS History: Patient Active Problem List   Diagnosis Date Noted  . Nonischemic cardiomyopathy ([place]) 09/03/2017  . AVNRT (AV nodal re-entry tachycardia) ([place]) 01/30/2017  . Chronic midline low back pain without sciatica 01/03/2017  . Diabetic  polyneuropathy associated with type 2 diabetes mellitus ([place]) 05/31/2016  . H/O amputation of leg through tibia and fibula, right ([place]) 05/03/2016  . Phantom limb pain ([place]) 03/06/2016  . Heat rash 03/06/2016  . Below knee amputation status ([place]) 02/29/2016  . SVT (supraventricular tachycardia) ([place]) 01/20/2016  . Chronic systolic heart failure ([place]) 01/20/2016  . Sepsis ([place]) 12/10/2015  . Diabetic foot ulcers ([place]) 07/12/2015  . Dysphagia 08/06/2014  . Anxiety state 10/26/2013  . PVC (premature ventricular contraction) 07/27/2011  . Uncontrolled type 2 diabetes mellitus with foot ulcer, without long-term current use of  insulin ([place]) 11/02/2010  . BASAL CELL CARCINOMA, FACE 10/18/2008  . BASAL CELL CARCINOMA, FACE 10/18/2008  . LIPOMA, SKIN 05/31/2008  . DRY SKIN 05/31/2008  . ERECTILE DYSFUNCTION 09/30/2007  . OSTEOARTHRITIS 08/08/2007  . Essential hypertension 03/24/2007   Past Medical History:  Diagnosis Date  . CAD (coronary artery disease)    Minimal plaque cath 2008  . Cataract    both  . Complication of anesthesia   . Diabetes mellitus   . ED (erectile dysfunction)   . Hypertension   . Osteoarthritis   . PONV (postoperative nausea and vomiting) 1960   with Ether  . Rheumatic fever   . Rhinitis     Family History  Problem Relation Age of Onset  . Stroke Father 50       Died age 68  . Coronary artery disease Brother 54  . Colon cancer Neg Hx   . Colon polyps Neg Hx   . Diabetes Neg Hx   . Esophageal cancer Neg Hx   . Kidney disease Neg Hx   . Gallbladder disease Neg Hx     Past Surgical History:  Procedure Laterality Date  . AMPUTATION Right 02/29/2016   Procedure: Right Leg AMPUTATION BELOW KNEE with Wound Vac placement;  Surgeon: [name], MD;  Location: [facility];  Service: Orthopedics;  Laterality: Right;  . CHOLECYSTECTOMY  1983  . colonoscopy  2006, 2010  . INGUINAL HERNIA REPAIR Right 1960   Social History   Occupational History  . Occupation: Curator - [employer]    Employer: RETIRED  Tobacco Use  . Smoking status: Former Smoker    Last attempt to quit: 07/26/1981    Years since quitting: 36.2  . Smokeless tobacco: Never Used  Substance and Sexual Activity  . Alcohol use: Yes    Alcohol/week: 8.4 oz    Types: 14 Shots of liquor per week  . Drug use: No  . Sexual activity: Never

## 2017-10-16 NOTE — Addendum Note (Signed)
Addended by: Meridee Score on: 10/16/2017 03:53 PM   Modules accepted: Orders

## 2017-10-22 ENCOUNTER — Telehealth: Payer: Self-pay | Admitting: Internal Medicine

## 2017-10-22 NOTE — Telephone Encounter (Signed)
New Message:       Pt c/o medication issue:  1. Name of Medication: sacubitril-valsartan (ENTRESTO) 24-26 MG  2. How are you currently taking this medication (dosage and times per day)? Take 1 tablet by mouth 2 (two) times daily.  3. Are you having a reaction (difficulty breathing--STAT)?   4. What is your medication issue? Pt is having a conflict with this medication and some other ones prescribed by another physician

## 2017-10-22 NOTE — Telephone Encounter (Signed)
Returned call to patient-patient states he was started on Entresto at last OV with Dr. Debara Pickett (5/8).   He states since he started this medication he has had excessive urination and is extremely fatigued.   He is urinating so frequently he had to change his clothes x 3 the first day he took this.   He states he is unable to sleep at night because of the urination as well.  He states he doesn't think he is going to be able to take this medication and would like an alternative.   He also states he has to have surgery on his leg Friday by Dr. Sharol Given and was started on doxycycline as well.  He believes this is causing him to experience the same side effects as well.  BP 152/76, 135/81, 130/82.     Advised I would route to Dr. Debara Pickett to review/advise.   Patient aware and verbalized understanding.

## 2017-10-23 MED ORDER — LISINOPRIL 5 MG PO TABS: 5.0000 mg | ORAL_TABLET | Freq: Every day | ORAL | 3 refills | Status: DC

## 2017-10-23 NOTE — Telephone Encounter (Signed)
Left message to call back  

## 2017-10-23 NOTE — Telephone Encounter (Signed)
Patient aware of recommendations.   He did not take Entresto yesterday or today, he is aware to start lisinopril on Friday (3 day washout).   rx sent to pharmacy.  Advised to continue to monitor BP and notify us with any significant change.

## 2017-10-23 NOTE — Telephone Encounter (Signed)
I would stop Entresto - Start lisinopril 5 mg daily instead. Must wait 3 days to wash out Entresto before starting lisinopril.  Dr. Lemmie Evens

## 2017-10-24 ENCOUNTER — Other Ambulatory Visit: Payer: Self-pay

## 2017-10-24 MED ORDER — LISINOPRIL 5 MG PO TABS: 5.0000 mg | ORAL_TABLET | Freq: Every day | ORAL | 3 refills | Status: DC

## 2017-10-28 ENCOUNTER — Other Ambulatory Visit (INDEPENDENT_AMBULATORY_CARE_PROVIDER_SITE_OTHER): Payer: Self-pay | Admitting: Orthopedic Surgery

## 2017-10-28 DIAGNOSIS — T8781 Dehiscence of amputation stump: Secondary | ICD-10-CM

## 2017-10-29 ENCOUNTER — Encounter (HOSPITAL_COMMUNITY): Payer: Self-pay | Admitting: *Deleted

## 2017-10-29 ENCOUNTER — Other Ambulatory Visit: Payer: Self-pay

## 2017-10-29 NOTE — Progress Notes (Addendum)
Mr [name] reports that he is not taking Lisinopril, didn't take it today and is not going to take, he will inform Dr Debara Pickett in the future. "It makes me have to go to the bathroom all the time."  I encourged patient to notify Dr Debara Pickett by phone in am.  Mr [name] reports that CBG runs 100- 110 in am , rarely 120. I instructed  Patient to hold Metformin in am. I instructed patient to check CBG after awaking and every 2 hours until arrival  to the hospital.  I Instructed patient if CBG is less than 70 to drink  1/2 cup of a clear juice. Recheck CBG in 15 minutes then call pre- op desk at [phone] for further instructions.

## 2017-10-30 ENCOUNTER — Encounter (HOSPITAL_COMMUNITY)
Admission: RE | Disposition: A | Discharge status: Home Health | Payer: Self-pay | Source: Ambulatory Visit | Attending: Orthopedic Surgery

## 2017-10-30 ENCOUNTER — Inpatient Hospital Stay (HOSPITAL_COMMUNITY): Payer: Medicare Other | Admitting: Certified Registered"

## 2017-10-30 ENCOUNTER — Inpatient Hospital Stay (HOSPITAL_COMMUNITY)
Admission: RE | Admit: 2017-10-30 | Discharge: 2017-11-02 | DRG: 464 | Disposition: A | Discharge status: Home Health | Payer: Medicare Other | Source: Ambulatory Visit | Attending: Orthopedic Surgery | Admitting: Orthopedic Surgery

## 2017-10-30 ENCOUNTER — Encounter (HOSPITAL_COMMUNITY): Payer: Self-pay | Admitting: Urology

## 2017-10-30 DIAGNOSIS — E1169 Type 2 diabetes mellitus with other specified complication: Secondary | ICD-10-CM | POA: Diagnosis present

## 2017-10-30 DIAGNOSIS — Z87442 Personal history of urinary calculi: Secondary | ICD-10-CM

## 2017-10-30 DIAGNOSIS — Z7984 Long term (current) use of oral hypoglycemic drugs: Secondary | ICD-10-CM

## 2017-10-30 DIAGNOSIS — I2510 Atherosclerotic heart disease of native coronary artery without angina pectoris: Secondary | ICD-10-CM | POA: Diagnosis present

## 2017-10-30 DIAGNOSIS — Z823 Family history of stroke: Secondary | ICD-10-CM | POA: Diagnosis not present

## 2017-10-30 DIAGNOSIS — Z85828 Personal history of other malignant neoplasm of skin: Secondary | ICD-10-CM

## 2017-10-30 DIAGNOSIS — I10 Essential (primary) hypertension: Secondary | ICD-10-CM | POA: Diagnosis not present

## 2017-10-30 DIAGNOSIS — Z87891 Personal history of nicotine dependence: Secondary | ICD-10-CM

## 2017-10-30 DIAGNOSIS — Z7982 Long term (current) use of aspirin: Secondary | ICD-10-CM

## 2017-10-30 DIAGNOSIS — M86261 Subacute osteomyelitis, right tibia and fibula: Secondary | ICD-10-CM | POA: Diagnosis not present

## 2017-10-30 DIAGNOSIS — I5022 Chronic systolic (congestive) heart failure: Secondary | ICD-10-CM | POA: Diagnosis not present

## 2017-10-30 DIAGNOSIS — Y835 Amputation of limb(s) as the cause of abnormal reaction of the patient, or of later complication, without mention of misadventure at the time of the procedure: Secondary | ICD-10-CM | POA: Diagnosis present

## 2017-10-30 DIAGNOSIS — I110 Hypertensive heart disease with heart failure: Secondary | ICD-10-CM | POA: Diagnosis not present

## 2017-10-30 DIAGNOSIS — Z9049 Acquired absence of other specified parts of digestive tract: Secondary | ICD-10-CM

## 2017-10-30 DIAGNOSIS — T8781 Dehiscence of amputation stump: Principal | ICD-10-CM

## 2017-10-30 DIAGNOSIS — IMO0002 Reserved for concepts with insufficient information to code with codable children: Secondary | ICD-10-CM

## 2017-10-30 DIAGNOSIS — Z8249 Family history of ischemic heart disease and other diseases of the circulatory system: Secondary | ICD-10-CM | POA: Diagnosis not present

## 2017-10-30 DIAGNOSIS — M869 Osteomyelitis, unspecified: Secondary | ICD-10-CM | POA: Diagnosis not present

## 2017-10-30 HISTORY — DX: Malignant (primary) neoplasm, unspecified: C80.1

## 2017-10-30 HISTORY — DX: Ventricular premature depolarization: I49.3

## 2017-10-30 HISTORY — DX: Personal history of urinary calculi: Z87.442

## 2017-10-30 HISTORY — PX: STUMP REVISION: SHX6102

## 2017-10-30 LAB — BASIC METABOLIC PANEL
Anion gap: 8 (ref 5–15)
BUN: 13 mg/dL (ref 6–20)
CO2: 22 mmol/L (ref 22–32)
Calcium: 8.9 mg/dL (ref 8.9–10.3)
Chloride: 109 mmol/L (ref 101–111)
Creatinine, Ser: 0.67 mg/dL (ref 0.61–1.24)
GFR calc Af Amer: >60 mL/min (ref >60)
GFR calc non Af Amer: >60 mL/min (ref >60)
Glucose, Bld: 121 mg/dL — ABNORMAL HIGH (ref 65–99)
Potassium: 4.2 mmol/L (ref 3.5–5.1)
Sodium: 139 mmol/L (ref 135–145)

## 2017-10-30 LAB — GLUCOSE, CAPILLARY
Glucose-Capillary: 108 mg/dL — ABNORMAL HIGH (ref 65–99)
Glucose-Capillary: 98 mg/dL (ref 65–99)
Glucose-Capillary: 123 mg/dL — ABNORMAL HIGH (ref 65–99)
Glucose-Capillary: 104 mg/dL — ABNORMAL HIGH (ref 65–99)

## 2017-10-30 LAB — CBC
HCT: 41.7 % (ref 39.0–52.0)
Hemoglobin: 13.7 g/dL (ref 13.0–17.0)
MCH: 28.7 pg (ref 26.0–34.0)
MCHC: 32.9 g/dL (ref 30.0–36.0)
MCV: 87.4 fL (ref 78.0–100.0)
Platelets: 211 10*3/uL (ref 150–400)
RBC: 4.77 MIL/uL (ref 4.22–5.81)
RDW: 12.9 % (ref 11.5–15.5)
WBC: 6.7 10*3/uL (ref 4.0–10.5)

## 2017-10-30 LAB — HEMOGLOBIN A1C
Hgb A1c MFr Bld: 5.9 % — ABNORMAL HIGH (ref 4.8–5.6)
Mean Plasma Glucose: 122.63 mg/dL

## 2017-10-30 SURGERY — REVISION, AMPUTATION SITE
Anesthesia: General | Site: Leg Lower | Laterality: Right

## 2017-10-30 MED ORDER — ONDANSETRON HCL 4 MG PO TABS: 4.0000 mg | ORAL_TABLET | Freq: Four times a day (QID) | ORAL | Status: DC | PRN

## 2017-10-30 MED ORDER — ONDANSETRON HCL 4 MG/2ML IJ SOLN
INTRAMUSCULAR | Status: AC
  Filled 2017-10-30: qty 2

## 2017-10-30 MED ORDER — ONDANSETRON HCL 4 MG/2ML IJ SOLN
INTRAMUSCULAR | Status: DC | PRN
  Administered 2017-10-30: 4 mg via INTRAVENOUS

## 2017-10-30 MED ORDER — BISACODYL 10 MG RE SUPP: 10.0000 mg | Freq: Every day | RECTAL | Status: DC | PRN

## 2017-10-30 MED ORDER — MEPERIDINE HCL 50 MG/ML IJ SOLN: 6.2500 mg | INTRAMUSCULAR | Status: DC | PRN

## 2017-10-30 MED ORDER — METFORMIN HCL 500 MG PO TABS
1000.0000 mg | ORAL_TABLET | Freq: Two times a day (BID) | ORAL | Status: DC
  Administered 2017-10-30 – 2017-11-02 (×6): 1000 mg via ORAL
  Filled 2017-10-30 (×6): qty 2

## 2017-10-30 MED ORDER — METOCLOPRAMIDE HCL 5 MG/ML IJ SOLN: 5.0000 mg | Freq: Three times a day (TID) | INTRAMUSCULAR | Status: DC | PRN

## 2017-10-30 MED ORDER — METOCLOPRAMIDE HCL 5 MG/ML IJ SOLN
INTRAMUSCULAR | Status: DC | PRN
  Administered 2017-10-30: 10 mg via INTRAVENOUS

## 2017-10-30 MED ORDER — OXYCODONE HCL 5 MG PO TABS
10.0000 mg | ORAL_TABLET | ORAL | Status: DC | PRN
  Administered 2017-10-30 – 2017-10-31 (×2): 10 mg via ORAL
  Administered 2017-11-02: 15 mg via ORAL
  Filled 2017-10-30 (×2): qty 2
  Filled 2017-10-30: qty 3

## 2017-10-30 MED ORDER — CEFAZOLIN SODIUM-DEXTROSE 2-4 GM/100ML-% IV SOLN
INTRAVENOUS | Status: AC
  Filled 2017-10-30: qty 100

## 2017-10-30 MED ORDER — PROPOFOL 10 MG/ML IV BOLUS
INTRAVENOUS | Status: DC | PRN
  Administered 2017-10-30: 130 mg via INTRAVENOUS

## 2017-10-30 MED ORDER — FENTANYL CITRATE (PF) 100 MCG/2ML IJ SOLN
INTRAMUSCULAR | Status: DC | PRN
  Administered 2017-10-30 (×2): 50 ug via INTRAVENOUS

## 2017-10-30 MED ORDER — ACETAMINOPHEN 325 MG PO TABS
325.0000 mg | ORAL_TABLET | Freq: Four times a day (QID) | ORAL | Status: DC | PRN
  Administered 2017-11-01: 650 mg via ORAL
  Filled 2017-10-30: qty 2

## 2017-10-30 MED ORDER — ONDANSETRON HCL 4 MG/2ML IJ SOLN: 4.0000 mg | Freq: Four times a day (QID) | INTRAMUSCULAR | Status: DC | PRN

## 2017-10-30 MED ORDER — DOCUSATE SODIUM 100 MG PO CAPS
100.0000 mg | ORAL_CAPSULE | Freq: Two times a day (BID) | ORAL | Status: DC
  Administered 2017-10-30 – 2017-10-31 (×2): 100 mg via ORAL
  Filled 2017-10-30 (×6): qty 1

## 2017-10-30 MED ORDER — HYDROMORPHONE HCL 2 MG/ML IJ SOLN
0.3000 mg | INTRAMUSCULAR | Status: DC | PRN
  Administered 2017-10-30: 0.5 mg via INTRAVENOUS

## 2017-10-30 MED ORDER — MAGNESIUM CITRATE PO SOLN: 1.0000 | Freq: Once | ORAL | Status: DC | PRN

## 2017-10-30 MED ORDER — LISINOPRIL 5 MG PO TABS
5.0000 mg | ORAL_TABLET | Freq: Every day | ORAL | Status: DC
  Administered 2017-10-30 – 2017-11-02 (×4): 5 mg via ORAL
  Filled 2017-10-30 (×4): qty 1

## 2017-10-30 MED ORDER — FENTANYL CITRATE (PF) 250 MCG/5ML IJ SOLN
INTRAMUSCULAR | Status: AC
  Filled 2017-10-30: qty 5

## 2017-10-30 MED ORDER — CEFAZOLIN SODIUM-DEXTROSE 2-4 GM/100ML-% IV SOLN
2.0000 g | INTRAVENOUS | Status: AC
  Administered 2017-10-30: 2 g via INTRAVENOUS

## 2017-10-30 MED ORDER — METHOCARBAMOL 500 MG PO TABS
500.0000 mg | ORAL_TABLET | Freq: Four times a day (QID) | ORAL | Status: DC | PRN
  Administered 2017-10-30 – 2017-11-01 (×4): 500 mg via ORAL
  Filled 2017-10-30 (×3): qty 1

## 2017-10-30 MED ORDER — ROCURONIUM BROMIDE 10 MG/ML (PF) SYRINGE
PREFILLED_SYRINGE | INTRAVENOUS | Status: AC
  Filled 2017-10-30: qty 10

## 2017-10-30 MED ORDER — CEFAZOLIN SODIUM-DEXTROSE 1-4 GM/50ML-% IV SOLN
1.0000 g | Freq: Four times a day (QID) | INTRAVENOUS | Status: AC
  Administered 2017-10-31 (×2): 1 g via INTRAVENOUS
  Filled 2017-10-30 (×3): qty 50

## 2017-10-30 MED ORDER — LACTATED RINGERS IV SOLN
INTRAVENOUS | Status: DC
  Administered 2017-10-30: 11:00:00 via INTRAVENOUS

## 2017-10-30 MED ORDER — PROMETHAZINE HCL 25 MG/ML IJ SOLN
6.2500 mg | INTRAMUSCULAR | Status: DC | PRN
Start: 2017-10-30 — End: 2017-10-30

## 2017-10-30 MED ORDER — HYDROMORPHONE HCL 2 MG/ML IJ SOLN
INTRAMUSCULAR | Status: AC
  Filled 2017-10-30: qty 1

## 2017-10-30 MED ORDER — OXYCODONE HCL 5 MG PO TABS
ORAL_TABLET | ORAL | Status: AC
  Filled 2017-10-30: qty 2

## 2017-10-30 MED ORDER — METHOCARBAMOL 1000 MG/10ML IJ SOLN
500.0000 mg | Freq: Four times a day (QID) | INTRAVENOUS | Status: DC | PRN
  Filled 2017-10-30: qty 5

## 2017-10-30 MED ORDER — OXYCODONE HCL 5 MG PO TABS
5.0000 mg | ORAL_TABLET | ORAL | Status: DC | PRN
  Administered 2017-10-30: 10 mg via ORAL
  Filled 2017-10-30: qty 2

## 2017-10-30 MED ORDER — HYDROMORPHONE HCL 2 MG/ML IJ SOLN: 0.5000 mg | INTRAMUSCULAR | Status: DC | PRN

## 2017-10-30 MED ORDER — ASPIRIN EC 325 MG PO TBEC
325.0000 mg | DELAYED_RELEASE_TABLET | Freq: Every day | ORAL | Status: DC
  Administered 2017-10-30 – 2017-11-01 (×3): 325 mg via ORAL
  Filled 2017-10-30 (×3): qty 1

## 2017-10-30 MED ORDER — LIDOCAINE 2% (20 MG/ML) 5 ML SYRINGE
INTRAMUSCULAR | Status: DC | PRN
  Administered 2017-10-30: 100 mg via INTRAVENOUS

## 2017-10-30 MED ORDER — ASPIRIN EC 325 MG PO TBEC
325.0000 mg | DELAYED_RELEASE_TABLET | Freq: Every day | ORAL | Status: DC
  Filled 2017-10-30: qty 1

## 2017-10-30 MED ORDER — HYDROMORPHONE HCL 1 MG/ML IJ SOLN
INTRAMUSCULAR | Status: AC
Start: 2017-10-30 — End: ?
  Filled 2017-10-30: qty 0.5

## 2017-10-30 MED ORDER — LIDOCAINE 2% (20 MG/ML) 5 ML SYRINGE
INTRAMUSCULAR | Status: AC
  Filled 2017-10-30: qty 10

## 2017-10-30 MED ORDER — METOCLOPRAMIDE HCL 5 MG PO TABS: 5.0000 mg | ORAL_TABLET | Freq: Three times a day (TID) | ORAL | Status: DC | PRN

## 2017-10-30 MED ORDER — METHOCARBAMOL 500 MG PO TABS
ORAL_TABLET | ORAL | Status: AC
  Filled 2017-10-30: qty 1

## 2017-10-30 MED ORDER — POLYETHYLENE GLYCOL 3350 17 G PO PACK
17.0000 g | PACK | Freq: Every day | ORAL | Status: DC | PRN
  Administered 2017-11-01: 17 g via ORAL
  Filled 2017-10-30 (×2): qty 1

## 2017-10-30 MED ORDER — GABAPENTIN 100 MG PO CAPS
100.0000 mg | ORAL_CAPSULE | Freq: Three times a day (TID) | ORAL | Status: DC
  Administered 2017-10-30 – 2017-11-02 (×8): 100 mg via ORAL
  Filled 2017-10-30 (×10): qty 1

## 2017-10-30 MED ORDER — SODIUM CHLORIDE 0.9 % IV SOLN
INTRAVENOUS | Status: DC
  Administered 2017-10-30 – 2017-11-01 (×2): via INTRAVENOUS

## 2017-10-30 MED ORDER — INSULIN ASPART 100 UNIT/ML ~~LOC~~ SOLN: 0.0000 [IU] | Freq: Three times a day (TID) | SUBCUTANEOUS | Status: DC

## 2017-10-30 MED ORDER — POTASSIUM CHLORIDE CRYS ER 20 MEQ PO TBCR
20.0000 meq | EXTENDED_RELEASE_TABLET | Freq: Every day | ORAL | Status: DC
  Administered 2017-10-31 – 2017-11-02 (×3): 20 meq via ORAL
  Filled 2017-10-30 (×3): qty 1

## 2017-10-30 MED ORDER — PROPOFOL 10 MG/ML IV BOLUS
INTRAVENOUS | Status: AC
  Filled 2017-10-30: qty 20

## 2017-10-30 MED ORDER — KETOROLAC TROMETHAMINE 30 MG/ML IJ SOLN: 30.0000 mg | Freq: Once | INTRAMUSCULAR | Status: DC | PRN

## 2017-10-30 MED ORDER — CARVEDILOL 12.5 MG PO TABS
12.5000 mg | ORAL_TABLET | Freq: Two times a day (BID) | ORAL | Status: DC
  Administered 2017-10-30 – 2017-11-02 (×5): 12.5 mg via ORAL
  Filled 2017-10-30 (×7): qty 1

## 2017-10-30 MED ORDER — 0.9 % SODIUM CHLORIDE (POUR BTL) OPTIME
TOPICAL | Status: DC | PRN
  Administered 2017-10-30: 1000 mL

## 2017-10-30 MED ORDER — SUCCINYLCHOLINE CHLORIDE 200 MG/10ML IV SOSY
PREFILLED_SYRINGE | INTRAVENOUS | Status: AC
  Filled 2017-10-30: qty 10

## 2017-10-30 MED ORDER — METOCLOPRAMIDE HCL 5 MG/ML IJ SOLN
INTRAMUSCULAR | Status: AC
  Filled 2017-10-30: qty 2

## 2017-10-30 SURGICAL SUPPLY — 32 items
APL SKNCLS STERI-STRIP NONHPOA (GAUZE/BANDAGES/DRESSINGS) ×1
BENZOIN TINCTURE PRP APPL 2/3 (GAUZE/BANDAGES/DRESSINGS) ×2 IMPLANT
BLADE SAW RECIP 87.9 MT (BLADE) IMPLANT
BLADE SURG 21 STRL SS (BLADE) ×3 IMPLANT
COVER SURGICAL LIGHT HANDLE (MISCELLANEOUS) ×3 IMPLANT
DRAPE EXTREMITY T 121X128X90 (DRAPE) ×3 IMPLANT
DRAPE HALF SHEET 40X57 (DRAPES) ×3 IMPLANT
DRAPE INCISE IOBAN 66X45 STRL (DRAPES) ×3 IMPLANT
DRAPE U-SHAPE 47X51 STRL (DRAPES) ×6 IMPLANT
DRSG VAC ATS MED SENSATRAC (GAUZE/BANDAGES/DRESSINGS) ×3 IMPLANT
DURAPREP 26ML APPLICATOR (WOUND CARE) ×3 IMPLANT
ELECT REM PT RETURN 9FT ADLT (ELECTROSURGICAL) ×3
ELECTRODE REM PT RTRN 9FT ADLT (ELECTROSURGICAL) ×1 IMPLANT
GLOVE BIOGEL PI IND STRL 9 (GLOVE) ×1 IMPLANT
GLOVE BIOGEL PI INDICATOR 9 (GLOVE) ×2
GLOVE SURG ORTHO 9.0 STRL STRW (GLOVE) ×3 IMPLANT
GOWN STRL REUS W/ TWL XL LVL3 (GOWN DISPOSABLE) ×2 IMPLANT
GOWN STRL REUS W/TWL XL LVL3 (GOWN DISPOSABLE) ×6
KIT BASIN OR (CUSTOM PROCEDURE TRAY) ×3 IMPLANT
KIT TURNOVER KIT B (KITS) ×3 IMPLANT
MANIFOLD NEPTUNE II (INSTRUMENTS) ×3 IMPLANT
NS IRRIG 1000ML POUR BTL (IV SOLUTION) ×3 IMPLANT
PACK GENERAL/GYN (CUSTOM PROCEDURE TRAY) ×3 IMPLANT
PAD ARMBOARD 7.5X6 YLW CONV (MISCELLANEOUS) ×3 IMPLANT
PAD NEG PRESSURE SENSATRAC (MISCELLANEOUS) IMPLANT
STAPLER VISISTAT 35W (STAPLE) IMPLANT
SUT ETHILON 2 0 PSLX (SUTURE) ×6 IMPLANT
SUT SILK 2 0 (SUTURE)
SUT SILK 2-0 18XBRD TIE 12 (SUTURE) IMPLANT
SUT VIC AB 1 CT1 27 (SUTURE) ×3
SUT VIC AB 1 CT1 27XBRD ANBCTR (SUTURE) IMPLANT
TOWEL OR 17X26 10 PK STRL BLUE (TOWEL DISPOSABLE) ×3 IMPLANT

## 2017-10-30 NOTE — Anesthesia Preprocedure Evaluation (Signed)
Anesthesia Evaluation  Patient identified by MRN, date of birth, ID band Patient awake    Reviewed: Allergy & Precautions, NPO status , Patient's Chart, lab work & pertinent test results  History of Anesthesia Complications (+) PONV  Airway Mallampati: I       Dental no notable dental hx. (+) Teeth Intact   Pulmonary former smoker,    Pulmonary exam normal breath sounds clear to auscultation       Cardiovascular hypertension, Pt. on medications and Pt. on home beta blockers Normal cardiovascular exam Rhythm:Regular Rate:Normal     Neuro/Psych    GI/Hepatic negative GI ROS, Neg liver ROS,   Endo/Other  diabetes, Type 2, Oral Hypoglycemic Agents  Renal/GU negative Renal ROS  negative genitourinary   Musculoskeletal   Abdominal Normal abdominal exam  (+)   Peds  Hematology negative hematology ROS (+)   Anesthesia Other Findings   Reproductive/Obstetrics                             Anesthesia Physical Anesthesia Plan  ASA: II  Anesthesia Plan: General   Post-op Pain Management:    Induction: Intravenous  PONV Risk Score and Plan: 3 and Ondansetron and Dexamethasone  Airway Management Planned: LMA  Additional Equipment:   Intra-op Plan:   Post-operative Plan:   Informed Consent: I have reviewed the patients History and Physical, chart, labs and discussed the procedure including the risks, benefits and alternatives for the proposed anesthesia with the patient or authorized representative who has indicated his/her understanding and acceptance.   Dental advisory given  Plan Discussed with: CRNA and Surgeon  Anesthesia Plan Comments:         Anesthesia Quick Evaluation

## 2017-10-30 NOTE — H&P (Signed)
[name] is an 77 y.o. male.   Chief Complaint: painfull ulcer right below knee amputation HPI: Patient is a 77 year old gentleman is status post a right transtibial amputation approximately 1-1/2 years ago.  Over the past several months patient has had multiple revisions to the socket multiple wound care treatments with a persistent ulcer over the tip of the residual limb.     Past Medical History:  Diagnosis Date  . CAD (coronary artery disease)    Minimal plaque cath 2008  . Cancer ([place])    skin cancer - basal  . Cataract    both  . Complication of anesthesia   . Diabetes mellitus    Type II  . ED (erectile dysfunction)   . History of kidney stones    x1  . Hypertension   . Osteoarthritis   . PONV (postoperative nausea and vomiting) 1960   with Ether  . PVC's (premature ventricular contractions)   . Rheumatic fever   . Rhinitis     Past Surgical History:  Procedure Laterality Date  . AMPUTATION Right 02/29/2016   Procedure: Right Leg AMPUTATION BELOW KNEE with Wound Vac placement;  Surgeon: [name], MD;  Location: [facility];  Service: Orthopedics;  Laterality: Right;  . CHOLECYSTECTOMY  1983  . colonoscopy  2006, 2010  . EYE SURGERY Bilateral    cataract  . INGUINAL HERNIA REPAIR Right 1960    Family History  Problem Relation Age of Onset  . Stroke Father 2       Died age 75  . Coronary artery disease Brother 52  . Colon cancer Neg Hx   . Colon polyps Neg Hx   . Diabetes Neg Hx   . Esophageal cancer Neg Hx   . Kidney disease Neg Hx   . Gallbladder disease Neg Hx    Social History:  reports that he quit smoking about 36 years ago. He quit after 20.00 years of use. He has never used smokeless tobacco. He reports that he drank alcohol. He reports that he does not use drugs.  Allergies: No Known Allergies  No medications prior to admission.    No results found for this or any previous visit (from the past 48 hour(s)). No results found.  Review of  Systems  All other systems reviewed and are negative.   There were no vitals taken for this visit. Physical Exam   Patient is alert, oriented, no adenopathy, well-dressed, normal affect, normal respiratory effort. Examination patient has resolving rash of the proximal aspect of his leg most likely due to the silicone liner.  The residual wound has a ulcer that is 3 cm in diameter 1 mm deep with granulation tissue there is no purulence no drainage no exposed bone or tendon.  This is directly over the distal tibia which has radiographic findings consistent with osteomyelitis.   Assessment/Plan Visit Diagnoses:  1. Dehiscence of amputation stump (HCC)   2. Subacute osteomyelitis of right tibia (HCC)     Plan: Due to the chronic ulcer and radiographic findings with osteomyelitis have recommended proceeding with a revision of the transtibial amputation.  Patient and his daughter state they understand and wish to proceed with surgery at this time discussed that he would need to be off his leg for at least 3 weeks.  Patient does live home alone and will need discharge to skilled nursing for about 3 weeks.     [name], MD 10/30/2017, 6:32 AM

## 2017-10-30 NOTE — Transfer of Care (Signed)
Immediate Anesthesia Transfer of Care Note  Patient: [name]  Procedure(s) Performed: RIGHT BELOW KNEE AMPUTATION REVISION (Right Leg Lower)  Patient Location: PACU  Anesthesia Type:General  Level of Consciousness: awake, alert  and oriented  Airway & Oxygen Therapy: Patient Spontanous Breathing  Post-op Assessment: Report given to RN and Post -op Vital signs reviewed and stable  Post vital signs: Reviewed and stable  Last Vitals:  Vitals Value Taken Time  BP    Temp    Pulse 66 10/30/2017  2:27 PM  Resp 14 10/30/2017  2:27 PM  SpO2 98 % 10/30/2017  2:27 PM  Vitals shown include unvalidated device data.  Last Pain:  Vitals:   10/30/17 1417  TempSrc:   PainSc: 8       Patients Stated Pain Goal: 4 (11/18/74 2831)  Complications: No apparent anesthesia complications

## 2017-10-30 NOTE — Anesthesia Postprocedure Evaluation (Signed)
Anesthesia Post Note  Patient: [name]  Procedure(s) Performed: RIGHT BELOW KNEE AMPUTATION REVISION (Right Leg Lower)     Patient location during evaluation: PACU Anesthesia Type: General Level of consciousness: awake Pain management: pain level controlled Vital Signs Assessment: post-procedure vital signs reviewed and stable Respiratory status: spontaneous breathing Cardiovascular status: stable Postop Assessment: no apparent nausea or vomiting Anesthetic complications: no    Last Vitals:  Vitals:   10/30/17 1445 10/30/17 1500  BP:  (!) 145/99  Pulse:  64  Resp: 15 14  Temp:    SpO2:  99%    Last Pain:  Vitals:   10/30/17 1417  TempSrc:   PainSc: 8    Pain Goal: Patients Stated Pain Goal: 4 (10/30/17 1417)               [place]

## 2017-10-30 NOTE — Op Note (Signed)
10/30/2017  2:13 PM  PATIENT:  [name]    PRE-OPERATIVE DIAGNOSIS:  osteomyelitis right below knee amputation  POST-OPERATIVE DIAGNOSIS:  Same  PROCEDURE:  RIGHT BELOW KNEE AMPUTATION REVISION Application of Praveena incisional wound VAC.  SURGEON:  [name], MD  PHYSICIAN ASSISTANT:None ANESTHESIA:   General  PREOPERATIVE INDICATIONS:  [name] is a  77 y.o. male with a diagnosis of osteomyelitis right below knee amputation who failed conservative measures and elected for surgical management.    The risks benefits and alternatives were discussed with the patient preoperatively including but not limited to the risks of infection, bleeding, nerve injury, cardiopulmonary complications, the need for revision surgery, among others, and the patient was willing to proceed.  OPERATIVE IMPLANTS: Praveena wound VAC  @ENCIMAGES @  OPERATIVE FINDINGS: No deep abscess  OPERATIVE PROCEDURE: Patient was brought the operating room and underwent a general anesthetic.  After adequate levels of anesthesia were obtained patient's right lower extremity was first prepped using Betadine paint dried prepped using DuraPrep draped in the sterile field a timeout was called.  A fishmouth incision was made surrounding the ulcerative tissue which was approximately 3 cm in diameter.  This distal tissue was resected.  This was then resected down to the tibia and the distal 2 cm in the tibia and the necrotic tissue with the ulcer were resected in one block of tissue.  The distal fibula was also transected to be proximal to the tibial incision.  Electrocautery was used for hemostasis the wound was irrigated with normal saline.  The deep fascial layers were closed using #1 Vicryl the skin and superficial fascial layers were closed using 2-0 nylon.  A Praveena wound VAC was applied this had a good suction fit patient was extubated taken to PACU in stable condition.   DISCHARGE PLANNING:  Antibiotic  duration: 24 hours  Weightbearing: Nonweightbearing on the right  Pain medication: Pain medications scheduled ordered  Dressing care/ Wound VAC: Praveena wound VAC remain in place for 1 week  Ambulatory devices: Walker  Discharge to: Home after 23-hour observation  Follow-up: In the office 1 week post operative.

## 2017-10-30 NOTE — Anesthesia Procedure Notes (Signed)
Procedure Name: LMA Insertion Date/Time: 10/30/2017 1:37 PM Performed by: Babs Bertin, CRNA Pre-anesthesia Checklist: Patient identified, Emergency Drugs available, Suction available and Patient being monitored Patient Re-evaluated:Patient Re-evaluated prior to induction Oxygen Delivery Method: Circle System Utilized Preoxygenation: Pre-oxygenation with 100% oxygen Induction Type: IV induction Ventilation: Mask ventilation without difficulty LMA: LMA inserted LMA Size: 4.0 Number of attempts: 1 Airway Equipment and Method: Bite block Placement Confirmation: positive ETCO2 Tube secured with: Tape Dental Injury: Teeth and Oropharynx as per pre-operative assessment

## 2017-10-31 ENCOUNTER — Telehealth (INDEPENDENT_AMBULATORY_CARE_PROVIDER_SITE_OTHER): Payer: Self-pay | Admitting: Radiology

## 2017-10-31 ENCOUNTER — Encounter (HOSPITAL_COMMUNITY): Payer: Self-pay | Admitting: Orthopedic Surgery

## 2017-10-31 DIAGNOSIS — Z9049 Acquired absence of other specified parts of digestive tract: Secondary | ICD-10-CM | POA: Diagnosis not present

## 2017-10-31 DIAGNOSIS — Y835 Amputation of limb(s) as the cause of abnormal reaction of the patient, or of later complication, without mention of misadventure at the time of the procedure: Secondary | ICD-10-CM | POA: Diagnosis present

## 2017-10-31 DIAGNOSIS — I2510 Atherosclerotic heart disease of native coronary artery without angina pectoris: Secondary | ICD-10-CM | POA: Diagnosis present

## 2017-10-31 DIAGNOSIS — Z87442 Personal history of urinary calculi: Secondary | ICD-10-CM | POA: Diagnosis not present

## 2017-10-31 DIAGNOSIS — I10 Essential (primary) hypertension: Secondary | ICD-10-CM | POA: Diagnosis present

## 2017-10-31 DIAGNOSIS — Z87891 Personal history of nicotine dependence: Secondary | ICD-10-CM | POA: Diagnosis not present

## 2017-10-31 DIAGNOSIS — E1169 Type 2 diabetes mellitus with other specified complication: Secondary | ICD-10-CM | POA: Diagnosis present

## 2017-10-31 DIAGNOSIS — Z7984 Long term (current) use of oral hypoglycemic drugs: Secondary | ICD-10-CM | POA: Diagnosis not present

## 2017-10-31 DIAGNOSIS — Z7982 Long term (current) use of aspirin: Secondary | ICD-10-CM | POA: Diagnosis not present

## 2017-10-31 DIAGNOSIS — T8781 Dehiscence of amputation stump: Secondary | ICD-10-CM | POA: Diagnosis present

## 2017-10-31 DIAGNOSIS — Z8249 Family history of ischemic heart disease and other diseases of the circulatory system: Secondary | ICD-10-CM | POA: Diagnosis not present

## 2017-10-31 DIAGNOSIS — Z85828 Personal history of other malignant neoplasm of skin: Secondary | ICD-10-CM | POA: Diagnosis not present

## 2017-10-31 DIAGNOSIS — Z823 Family history of stroke: Secondary | ICD-10-CM | POA: Diagnosis not present

## 2017-10-31 DIAGNOSIS — M86261 Subacute osteomyelitis, right tibia and fibula: Secondary | ICD-10-CM | POA: Diagnosis present

## 2017-10-31 LAB — GLUCOSE, CAPILLARY
Glucose-Capillary: 117 mg/dL — ABNORMAL HIGH (ref 65–99)
Glucose-Capillary: 122 mg/dL — ABNORMAL HIGH (ref 65–99)
Glucose-Capillary: 141 mg/dL — ABNORMAL HIGH (ref 65–99)
Glucose-Capillary: 109 mg/dL — ABNORMAL HIGH (ref 65–99)

## 2017-10-31 NOTE — Evaluation (Signed)
Physical Therapy Evaluation Patient Details Name: [name] MRN: [id number] DOB: 10-Oct-1940 Today's Date: 10/31/2017   History of Present Illness  Pt is a 77 y/o male s/p R transtibial amputation revision secondary to dehiscence. PMH including but not limited to CAD, DM and HTN.\   Clinical Impression  Pt presented supine in bed with HOB elevated, awake and willing to participate in therapy session. Prior to admission, pt reported that he was ambulating with use of SPC and R LE prosthesis, and he was independent with ADLs. Pt lives alone and will not have enough support to safely d/c home. He is wanting to go to ST rehab prior to returning home. Pt currently able to perform bed mobility at modified independence and requires mod A for transfers with RW. Pt would continue to benefit from skilled physical therapy services at this time while admitted and after d/c to address the below listed limitations in order to improve overall safety and independence with functional mobility.     Follow Up Recommendations SNF    Equipment Recommendations  None recommended by PT    Recommendations for Other Services       Precautions / Restrictions Precautions Precautions: Fall Precaution Comments: wound VAC Restrictions Weight Bearing Restrictions: Yes RLE Weight Bearing: Non weight bearing      Mobility  Bed Mobility Overal bed mobility: Needs Assistance Bed Mobility: Supine to Sit     Supine to sit: Modified independent (Device/Increase time)        Transfers Overall transfer level: Needs assistance Equipment used: Rolling walker (2 wheeled) Transfers: Sit to/from [facility] Sit to Stand: Mod assist Stand pivot transfers: Mod assist       General transfer comment: increased time and effort, assist to power into standing from EOB and assist with balance and safety with pivotal movement to [facility]  Ambulation/Gait                Stairs             Wheelchair Mobility    Modified Rankin (Stroke Patients Only)       Balance Overall balance assessment: Needs assistance Sitting-balance support: No upper extremity supported Sitting balance-Leahy Scale: Good     Standing balance support: During functional activity;Bilateral upper extremity supported Standing balance-Leahy Scale: Poor                               Pertinent Vitals/Pain Pain Assessment: No/denies pain    Home Living Family/patient expects to be discharged to:: Skilled nursing facility Living Arrangements: Alone                    Prior Function Level of Independence: Independent with assistive device(s)         Comments: pt reported that he ambulated with use of SPC and R LE prosthesis     Hand Dominance        Extremity/Trunk Assessment   Upper Extremity Assessment Upper Extremity Assessment: Overall WFL for tasks assessed    Lower Extremity Assessment Lower Extremity Assessment: Generalized weakness       Communication   Communication: No difficulties  Cognition Arousal/Alertness: Awake/alert Behavior During Therapy: WFL for tasks assessed/performed Overall Cognitive Status: Within Functional Limits for tasks assessed  General Comments      Exercises     Assessment/Plan    PT Assessment Patient needs continued PT services  PT Problem List Decreased strength;Decreased activity tolerance;Decreased balance;Decreased mobility;Decreased coordination;Decreased knowledge of use of DME;Decreased safety awareness;Decreased knowledge of precautions       PT Treatment Interventions DME instruction;Gait training;Stair training;Functional mobility training;Therapeutic activities;Therapeutic exercise;Balance training;Neuromuscular re-education;Patient/family education    PT Goals (Current goals can be found in the Care Plan section)  Acute Rehab PT Goals Patient  Stated Goal: go to rehab prior to returnin home PT Goal Formulation: With patient Time For Goal Achievement: 11/14/17 Potential to Achieve Goals: Good    Frequency Min 3X/week   Barriers to discharge        Co-evaluation               AM-PAC PT "6 Clicks" Daily Activity  Outcome Measure Difficulty turning over in bed (including adjusting bedclothes, sheets and blankets)?: None Difficulty moving from lying on back to sitting on the side of the bed? : None Difficulty sitting down on and standing up from a chair with arms (e.g., wheelchair, bedside commode, etc,.)?: Unable Help needed moving to and from a bed to chair (including a wheelchair)?: A Lot Help needed walking in hospital room?: A Lot Help needed climbing 3-5 steps with a railing? : Total 6 Click Score: 14    End of Session Equipment Utilized During Treatment: Gait belt Activity Tolerance: Patient tolerated treatment well Patient left: with call bell/phone within reach;Other (comment)(sitting on BSC to attempt to have a BM) Nurse Communication: Mobility status PT Visit Diagnosis: Other abnormalities of gait and mobility (R26.89)    Time: 1308-6578 PT Time Calculation (min) (ACUTE ONLY): 13 min   Charges:   PT Evaluation $PT Eval Moderate Complexity: 1 Mod     PT G Codes:        [place], PT, DPT [place] 10/31/2017, 4:27 PM

## 2017-10-31 NOTE — Care Management Obs Status (Signed)
[place] NOTIFICATION   Patient Details  Name: [name] MRN: [id number] Date of Birth: 19-Jun-1940   Medicare Observation Status Notification Given:  Yes    Ninfa Meeker, RN 10/31/2017, 12:13 PM

## 2017-10-31 NOTE — Plan of Care (Signed)

## 2017-10-31 NOTE — Telephone Encounter (Signed)
See below

## 2017-10-31 NOTE — Telephone Encounter (Signed)
Received voicemail on triage line from [place] requesting patient be changed to inpatient status.  He is in 5N-19.  Please call back to advise.  [phone]

## 2017-10-31 NOTE — Progress Notes (Signed)
Patient ID: [name], male   DOB: [birth year], 77 y.o.   MRN: [id number] Patient has no complaints this morning.  Minimal drainage in the wound VAC canister.  Patient is not safe for discharge to home he states that he would not get enough care and attention if he were to discharge with his daughter who works and has a family.  Patient request discharge to skilled nursing.

## 2017-10-31 NOTE — Progress Notes (Signed)
Patient requested a wheelchair very upset d/t being R, LE, Wheelchair found and put in patients room

## 2017-11-01 ENCOUNTER — Other Ambulatory Visit: Payer: Self-pay | Admitting: Family Medicine

## 2017-11-01 LAB — GLUCOSE, CAPILLARY
Glucose-Capillary: 112 mg/dL — ABNORMAL HIGH (ref 65–99)
Glucose-Capillary: 94 mg/dL (ref 65–99)
Glucose-Capillary: 115 mg/dL — ABNORMAL HIGH (ref 65–99)
Glucose-Capillary: 120 mg/dL — ABNORMAL HIGH (ref 65–99)

## 2017-11-01 MED ORDER — OXYCODONE-ACETAMINOPHEN 5-325 MG PO TABS: 1.0000 | ORAL_TABLET | ORAL | 0 refills | Status: DC | PRN

## 2017-11-01 NOTE — Clinical Social Work Note (Signed)
Clinical Social Work Assessment  Patient Details  Name: [name] MRN: [id number] Date of Birth: [birth year]  Date of referral:  11/01/17               Reason for consult:  Facility Placement                Permission sought to share information with:  Chartered certified accountant granted to share information::  No  Name::        Agency::     Relationship::     Contact Information:     Housing/Transportation Living arrangements for the past 2 months:  Single Family Home Source of Information:  Patient Patient Interpreter Needed:  None Criminal Activity/Legal Involvement Pertinent to Current Situation/Hospitalization:  No - Comment as needed Significant Relationships:  Adult Children Lives with:  Self Do you feel safe going back to the place where you live?  Yes Need for family participation in patient care:  No (Coment)  Care giving concerns:  Pt resides alone and rehabilitation is recommending SNF.  Pt was initially in the hospital under "obs" status but then felt that he needed more time to get better and ws open to SNF. Pt indicated that since he has been in hospital another day he is doing well and desires to return home with home health. He has motorized scooter, wheelchair and walker at home. He indicated that his home is set up with safety support as his deceased wife used a wheelchair and home was updated at that time due to her needs.     Social Worker assessment / plan:  CSW met with patient at bedside and he declined SNF. Pt indicated that he is much improved and desires to return home.  CSW validated and will defer to [facility] to assist with home needs.  Employment status:  Retired Forensic scientist:  [employer] PT Recommendations:  [place] / Referral to community resources:  [place]  Patient/Family's Response to care:  Economist.  Patient/Family's Understanding of and Emotional Response to Diagnosis,  Current Treatment, and Prognosis:  Pt has good understanding of impairment as he had amputation about a year ago and has adjusted to his prosthetic limb. Pt independently drives and takes care of his ADL's. Pt desires to return home and declines SNF at this time.   Emotional Assessment Appearance:  Appears stated age Attitude/Demeanor/Rapport:  (Cooperative) Affect (typically observed):  Accepting, Appropriate Orientation:  Oriented to Situation, Oriented to  Time, Oriented to Place, Oriented to Self Alcohol / Substance use:  Not Applicable Psych involvement (Current and /or in the community):  No (Comment)  Discharge Needs  Concerns to be addressed:  Discharge Planning Concerns Readmission within the last 30 days:  No Current discharge risk:  Lives alone, Physical Impairment Barriers to Discharge:  No Barriers Identified   Normajean Baxter, LCSW 11/01/2017, 12:09 PM

## 2017-11-01 NOTE — NC FL2 (Signed)
[place] LEVEL OF CARE SCREENING TOOL     IDENTIFICATION  Patient Name: [name] Birthdate: 11/02/1940 Sex: male Admission Date (Current Location): 10/30/2017  [facility] and [place] Number:  Herbalist and Address:  [facility] [place] [address], [place], [place] [address]      Provider Number: [id number]  Attending Physician Name and Address:  [name], MD  Relative Name and Phone Number:  [name], daughter, [phone]    Current Level of Care: Hospital Recommended Level of Care: [place] Prior Approval Number:    Date Approved/Denied:   PASRR Number: [id number]  Discharge Plan: SNF    Current Diagnoses: Patient Active Problem List   Diagnosis Date Noted  . Below knee amputation status, unspecified laterality ([place]) 10/31/2017  . Below knee amputation status, right ([place]) 10/30/2017  . Dehiscence of amputation stump ([place])   . Nonischemic cardiomyopathy ([place]) 09/03/2017  . AVNRT (AV nodal re-entry tachycardia) ([place]) 01/30/2017  . Chronic midline low back pain without sciatica 01/03/2017  . Diabetic polyneuropathy associated with type 2 diabetes mellitus ([place]) 05/31/2016  . H/O amputation of leg through tibia and fibula, right ([place]) 05/03/2016  . Phantom limb pain ([place]) 03/06/2016  . Heat rash 03/06/2016  . Below knee amputation status ([place]) 02/29/2016  . SVT (supraventricular tachycardia) ([place]) 01/20/2016  . Chronic systolic heart failure ([place]) 01/20/2016  . Sepsis ([place]) 12/10/2015  . Diabetic foot ulcers ([place]) 07/12/2015  . Dysphagia 08/06/2014  . Anxiety state 10/26/2013  . PVC (premature ventricular contraction) 07/27/2011  . Uncontrolled type 2 diabetes mellitus with foot ulcer, without long-term current use of insulin ([place]) 11/02/2010  . BASAL CELL CARCINOMA, FACE 10/18/2008  . BASAL CELL CARCINOMA, FACE 10/18/2008  . LIPOMA, SKIN 05/31/2008  . DRY SKIN 05/31/2008  . ERECTILE  DYSFUNCTION 09/30/2007  . OSTEOARTHRITIS 08/08/2007  . Essential hypertension 03/24/2007    Orientation RESPIRATION BLADDER Height & Weight     Self, Time, Situation, Place  Normal Continent Weight: 219 lb (99.3 kg) Height:  6' (182.9 cm)  BEHAVIORAL SYMPTOMS/MOOD NEUROLOGICAL BOWEL NUTRITION STATUS      Continent Diet(See DC Summary)  AMBULATORY STATUS COMMUNICATION OF NEEDS Skin   Extensive Assist(mod A) Verbally Wound Vac, Surgical wounds                       Personal Care Assistance Level of Assistance  Bathing, Feeding, Dressing Bathing Assistance: Limited assistance Feeding assistance: Limited assistance Dressing Assistance: Limited assistance     Functional Limitations Info  Sight, Hearing, Speech Sight Info: Adequate Hearing Info: Adequate Speech Info: Adequate    SPECIAL CARE FACTORS FREQUENCY  PT (By licensed PT), OT (By licensed OT)     PT Frequency: 3x week OT Frequency: 3x week            Contractures      Additional Factors Info  Code Status, Allergies Code Status Info: Full  Allergies Info: No Allergies           Current Medications (11/01/2017):  This is the current hospital active medication list Current Facility-Administered Medications  Medication Dose Route Frequency Provider Last Rate Last Dose  . 0.9 %  sodium chloride infusion   Intravenous Continuous [name], MD 10 mL/hr at 11/01/17 0444    . acetaminophen (TYLENOL) tablet 325-650 mg  325-650 mg Oral Q6H PRN [name], MD      . aspirin EC tablet  325 mg  325 mg Oral QHS [name], MD   325 mg at 10/31/17 2135  . bisacodyl (DULCOLAX) suppository 10 mg  10 mg Rectal Daily PRN [name], MD      . carvedilol (COREG) tablet 12.5 mg  12.5 mg Oral BID WC [name], MD   12.5 mg at 11/01/17 0757  . docusate sodium (COLACE) capsule 100 mg  100 mg Oral BID [name], MD   100 mg at 10/31/17 0946  . gabapentin (NEURONTIN) capsule 100 mg  100 mg Oral TID  [name], MD   100 mg at 11/01/17 0800  . HYDROmorphone (DILAUDID) injection 0.5-1 mg  0.5-1 mg Intravenous Q4H PRN [name], MD      . insulin aspart (novoLOG) injection 0-15 Units  0-15 Units Subcutaneous TID WC [name], MD      . lisinopril (PRINIVIL,ZESTRIL) tablet 5 mg  5 mg Oral Daily [name], MD   5 mg at 11/01/17 0800  . magnesium citrate solution 1 Bottle  1 Bottle Oral Once PRN [name], MD      . metFORMIN (GLUCOPHAGE) tablet 1,000 mg  1,000 mg Oral BID WC [name], MD   1,000 mg at 11/01/17 0757  . methocarbamol (ROBAXIN) tablet 500 mg  500 mg Oral Q6H PRN [name], MD   500 mg at 10/31/17 2135   Or  . methocarbamol (ROBAXIN) 500 mg in dextrose 5 % 50 mL IVPB  500 mg Intravenous Q6H PRN [name], MD      . metoCLOPramide (REGLAN) tablet 5-10 mg  5-10 mg Oral Q8H PRN [name], MD       Or  . metoCLOPramide (REGLAN) injection 5-10 mg  5-10 mg Intravenous Q8H PRN [name], MD      . ondansetron [facility]) tablet 4 mg  4 mg Oral Q6H PRN [name], MD       Or  . ondansetron [facility]) injection 4 mg  4 mg Intravenous Q6H PRN [name], MD      . oxyCODONE (Oxy IR/ROXICODONE) immediate release tablet 10-15 mg  10-15 mg Oral Q4H PRN [name], MD   10 mg at 10/31/17 0227  . oxyCODONE (Oxy IR/ROXICODONE) immediate release tablet 5-10 mg  5-10 mg Oral Q4H PRN [name], MD   10 mg at 10/30/17 1551  . polyethylene glycol (MIRALAX / GLYCOLAX) packet 17 g  17 g Oral Daily PRN [name], MD   17 g at 11/01/17 0439  . potassium chloride SA (K-DUR,KLOR-CON) CR tablet 20 mEq  20 mEq Oral Daily [name], MD   20 mEq at 11/01/17 0800     Discharge Medications: Please see discharge summary for a list of discharge medications.  Relevant Imaging Results:  Relevant Lab Results:   Additional Information SS#: [place], LCSW

## 2017-11-01 NOTE — Progress Notes (Signed)
Patient ID: [name], male   DOB: [birth year], 77 y.o.   MRN: [id number] Wound VAC functioning well patient without complaints plan for discharge to skilled nursing on Saturday.  Discharge orders written and prescription on the chart.

## 2017-11-01 NOTE — Care Management Important Message (Signed)
Important Message  Patient Details  Name: [name] MRN: [id number] Date of Birth: [birth year]   Medicare Important Message Given:  Yes    Orbie Pyo 11/01/2017, 4:02 PM

## 2017-11-01 NOTE — Discharge Summary (Signed)
Discharge Diagnoses:  Active Problems:   Dehiscence of amputation stump ([name])   Below knee amputation status, right ([name])   Below knee amputation status, unspecified laterality ([name])   Surgeries: Procedure(s): RIGHT BELOW KNEE AMPUTATION REVISION on 10/30/2017    Consultants:   Discharged Condition: Improved  Hospital Course: [name] is an 77 y.o. male who was admitted 10/30/2017 with a chief complaint of dehiscence right BKA, with a final diagnosis of osteomyelitis right below knee amputation.  Patient was brought to the operating room on 10/30/2017 and underwent Procedure(s): RIGHT BELOW KNEE AMPUTATION REVISION.    Patient was given perioperative antibiotics:  Anti-infectives (From admission, onward)   Start     Dose/Rate Route Frequency Ordered Stop   10/30/17 2000  ceFAZolin (ANCEF) IVPB 1 g/50 mL premix     1 g 100 mL/hr over 30 Minutes Intravenous Every 6 hours 10/30/17 1632 10/31/17 1359   10/30/17 1015  ceFAZolin (ANCEF) IVPB 2g/100 mL premix     2 g 200 mL/hr over 30 Minutes Intravenous On call to O.R. 10/30/17 1009 10/30/17 1407   10/30/17 0958  ceFAZolin (ANCEF) 2-4 GM/100ML-% IVPB    Note to Pharmacy:  Debbe Bales, [place]: cabinet override      10/30/17 0958 10/30/17 1337    .  Patient was given sequential compression devices, early ambulation, and aspirin for DVT prophylaxis.  Recent vital signs:  Patient Vitals for the past 24 hrs:  BP Temp Temp src Pulse Resp SpO2  11/01/17 0420 128/83 98.2 F (36.8 C) Oral (!) 55 20 96 %  10/31/17 2102 124/74 98.3 F (36.8 C) Oral 85 20 96 %  10/31/17 1407 118/79 98 F (36.7 C) Oral 76 18 96 %  .  Recent laboratory studies: No results found.  Discharge Medications:   Allergies as of 11/01/2017   No Known Allergies     Medication List    TAKE these medications   accu-chek multiclix lancets USE TO TEST ONCE DAILY   aspirin EC 325 MG tablet Take 325 mg by mouth daily. May take an additional 325 or 650 mg twice  daily as needed for pain / headaches   aspirin-sod bicarb-citric acid 325 MG Tbef tablet Commonly known as:  ALKA-SELTZER Take 650 mg by mouth daily as needed (heart burn).   carvedilol 12.5 MG tablet Commonly known as:  COREG TAKE 1 TABLET (12.5 MG TOTAL) BY MOUTH 2 (TWO) TIMES DAILY.   doxycycline 100 MG tablet Commonly known as:  VIBRA-TABS Take 1 tablet (100 mg total) by mouth 2 (two) times daily.   gabapentin 100 MG capsule Commonly known as:  NEURONTIN Take 1 capsule (100 mg total) by mouth 3 (three) times daily. What changed:    when to take this  reasons to take this   glucose blood test strip Commonly known as:  ACCU-CHEK AVIVA PLUS USE TO TEST DAILY AS INSTRUCTED PER MD**EMERGENCY FILL MEDICAL REASON**   halobetasol 0.05 % cream Commonly known as:  ULTRAVATE APPLY TO AFFECTED AREA TWICE A DAY   hydrocortisone cream 1 % Apply 1 application topically daily as needed for itching.   lisinopril 5 MG tablet Commonly known as:  PRINIVIL,ZESTRIL Take 1 tablet (5 mg total) by mouth daily.   loratadine 10 MG tablet Commonly known as:  CLARITIN Take 10 mg by mouth daily.   metFORMIN 1000 MG tablet Commonly known as:  GLUCOPHAGE Take 1 tablet (1,000 mg total) by mouth 2 (two) times daily.   methocarbamol 500 MG tablet  Commonly known as:  ROBAXIN TAKE 1 TABLET EVERY 6 HOURS AS NEEDED MUSCLE SPASMS   multivitamin tablet Take 1 tablet by mouth daily.   nystatin cream Commonly known as:  MYCOSTATIN APPLY TO AFFECTED AREA TWICE A DAY   oxyCODONE-acetaminophen 5-325 MG tablet Commonly known as:  PERCOCET/ROXICET Take 1 tablet by mouth every 4 (four) hours as needed for severe pain.   potassium chloride SA 20 MEQ tablet Commonly known as:  K-DUR,KLOR-CON TAKE 2 TABLETS IN THE MORNING   traMADol 50 MG tablet Commonly known as:  ULTRAM Take 1 tablet (50 mg total) by mouth every 6 (six) hours as needed for moderate pain.   TURMERIC PO Take 1 tablet by mouth  daily as needed (FOR LEG CRAMPS).   VISINE OP Place 1 drop into both eyes daily as needed (dry eyes).       Diagnostic Studies: Xr Tibia/fibula Right  Result Date: 10/16/2017 2 view radiographs of the right transtibial amputation shows lytic changes of the distal tibia consistent with osteomyelitis.   Patient benefited maximally from their hospital stay and there were no complications.     Disposition: Discharge disposition: 03-Skilled Nursing Facility      Discharge Instructions    Call MD / Call 911   Complete by:  As directed    If you experience chest pain or shortness of breath, CALL 911 and be transported to the hospital emergency room.  If you develope a fever above 101 F, pus (white drainage) or increased drainage or redness at the wound, or calf pain, call your surgeon's office.   Constipation Prevention   Complete by:  As directed    Drink plenty of fluids.  Prune juice may be helpful.  You may use a stool softener, such as Colace (over the counter) 100 mg twice a day.  Use MiraLax (over the counter) for constipation as needed.   Diet - low sodium heart healthy   Complete by:  As directed    Increase activity slowly as tolerated   Complete by:  As directed    Negative Pressure Wound Therapy - Incisional   Complete by:  As directed    Continue wound VAC for 1 week.     Follow-up Information    [name], MD In 1 week.   Specialty:  Orthopedic Surgery Contact information: [address] [place] [address] [phone]            Signed: [name] 11/01/2017, 6:52 AM

## 2017-11-01 NOTE — Care Management Note (Signed)
Case Management Note  Patient Details  Name: [name] MRN: [id number] Date of Birth: 22-Jun-1940  Subjective/Objective:  77 yr old gentleman s/p revision of right BKA amputation.                    Action/Plan:  Case manager has spoken with patient concerning his desire to go home rather than SNF. Patient says he is confident he will be ok. He has DME and family will check in on him. Choice for Home Health was offered, referral was called to Neoma Laming, [facility] Liaison.    Expected Discharge Date:  11/02/17               Expected Discharge Plan:  [place]  In-House Referral:  NA  Discharge planning Services  CM Consult  Post Acute Care Choice:  Home Health Choice offered to:  Patient  DME Arranged:  N/A(patient has RW, wheelchair handiap acessible home ) DME Agency:  NA  HH Arranged:  PT [place] Agency:  [place]  Status of Service:  Completed, signed off  If discussed at [place] of Stay Meetings, dates discussed:    Additional Comments:  Ninfa Meeker, RN 11/01/2017, 12:34 PM

## 2017-11-02 LAB — GLUCOSE, CAPILLARY: Glucose-Capillary: 121 mg/dL — ABNORMAL HIGH (ref 65–99)

## 2017-11-02 NOTE — Progress Notes (Signed)
Patient ID: [name], male   DOB: 1940/08/30, 77 y.o.   MRN: [id number]  Patient is stable for discharge to home today.  Patient is upset that he was only given a spoon to eat his breakfast  Prescriptions on the chart for Percocet.  We will have a new canister placed in the [place] plus wound VAC pump prior to discharge.

## 2017-11-02 NOTE — Discharge Summary (Signed)
Discharge Diagnoses:  Active Problems:   Dehiscence of amputation stump ([place])   Below knee amputation status, right ([place])   Below knee amputation status, unspecified laterality ([place])   Surgeries: Procedure(s): RIGHT BELOW KNEE AMPUTATION REVISION on 10/30/2017    Consultants:   Discharged Condition: Improved  Hospital Course: [name] is an 77 y.o. male who was admitted 10/30/2017 with a chief complaint of dehiscence right BKA, with a final diagnosis of osteomyelitis right below knee amputation.  Patient was brought to the operating room on 10/30/2017 and underwent Procedure(s): RIGHT BELOW KNEE AMPUTATION REVISION.    Patient was given perioperative antibiotics:  Anti-infectives (From admission, onward)   Start     Dose/Rate Route Frequency Ordered Stop   10/30/17 2000  ceFAZolin (ANCEF) IVPB 1 g/50 mL premix     1 g 100 mL/hr over 30 Minutes Intravenous Every 6 hours 10/30/17 1632 10/31/17 1359   10/30/17 1015  ceFAZolin (ANCEF) IVPB 2g/100 mL premix     2 g 200 mL/hr over 30 Minutes Intravenous On call to O.R. 10/30/17 1009 10/30/17 1407   10/30/17 0958  ceFAZolin (ANCEF) 2-4 GM/100ML-% IVPB    Note to Pharmacy:  Debbe Bales, [place]: cabinet override      10/30/17 0958 10/30/17 1337    .  Patient was given sequential compression devices, early ambulation, and aspirin for DVT prophylaxis.  Recent vital signs:  Patient Vitals for the past 24 hrs:  BP Temp Temp src Pulse Resp SpO2  11/02/17 0501 129/67 98.7 F (37.1 C) Oral (!) 57 19 95 %  11/01/17 2054 128/73 97.9 F (36.6 C) Oral 76 19 97 %  11/01/17 1824 124/68 98.7 F (37.1 C) Oral 81 - 96 %  .  Recent laboratory studies: No results found.  Discharge Medications:   Allergies as of 11/02/2017   No Known Allergies     Medication List    TAKE these medications   accu-chek multiclix lancets USE TO TEST ONCE DAILY   aspirin EC 325 MG tablet Take 325 mg by mouth daily. May take an additional 325 or 650 mg  twice daily as needed for pain / headaches   aspirin-sod bicarb-citric acid 325 MG Tbef tablet Commonly known as:  ALKA-SELTZER Take 650 mg by mouth daily as needed (heart burn).   carvedilol 12.5 MG tablet Commonly known as:  COREG TAKE 1 TABLET (12.5 MG TOTAL) BY MOUTH 2 (TWO) TIMES DAILY.   doxycycline 100 MG tablet Commonly known as:  VIBRA-TABS Take 1 tablet (100 mg total) by mouth 2 (two) times daily.   gabapentin 100 MG capsule Commonly known as:  NEURONTIN Take 1 capsule (100 mg total) by mouth 3 (three) times daily. What changed:    when to take this  reasons to take this   glucose blood test strip Commonly known as:  ACCU-CHEK AVIVA PLUS USE TO TEST DAILY AS INSTRUCTED PER MD**EMERGENCY FILL MEDICAL REASON**   halobetasol 0.05 % cream Commonly known as:  ULTRAVATE APPLY TO AFFECTED AREA TWICE A DAY   hydrocortisone cream 1 % Apply 1 application topically daily as needed for itching.   lisinopril 5 MG tablet Commonly known as:  PRINIVIL,ZESTRIL Take 1 tablet (5 mg total) by mouth daily.   loratadine 10 MG tablet Commonly known as:  CLARITIN Take 10 mg by mouth daily.   metFORMIN 1000 MG tablet Commonly known as:  GLUCOPHAGE TAKE 1 TABLET (1,000 MG TOTAL) BY MOUTH 2 (TWO) TIMES DAILY.   methocarbamol 500 MG tablet  Commonly known as:  ROBAXIN TAKE 1 TABLET EVERY 6 HOURS AS NEEDED MUSCLE SPASMS   multivitamin tablet Take 1 tablet by mouth daily.   nystatin cream Commonly known as:  MYCOSTATIN APPLY TO AFFECTED AREA TWICE A DAY   oxyCODONE-acetaminophen 5-325 MG tablet Commonly known as:  PERCOCET/ROXICET Take 1 tablet by mouth every 4 (four) hours as needed for severe pain.   potassium chloride SA 20 MEQ tablet Commonly known as:  K-DUR,KLOR-CON TAKE 2 TABLETS IN THE MORNING   traMADol 50 MG tablet Commonly known as:  ULTRAM Take 1 tablet (50 mg total) by mouth every 6 (six) hours as needed for moderate pain.   TURMERIC PO Take 1 tablet by  mouth daily as needed (FOR LEG CRAMPS).   VISINE OP Place 1 drop into both eyes daily as needed (dry eyes).       Diagnostic Studies: Xr Tibia/fibula Right  Result Date: 10/16/2017 2 view radiographs of the right transtibial amputation shows lytic changes of the distal tibia consistent with osteomyelitis.   Patient benefited maximally from their hospital stay and there were no complications.     Disposition: Discharge disposition: 01-Home or Self Care      Discharge Instructions    Call MD / Call 911   Complete by:  As directed    If you experience chest pain or shortness of breath, CALL 911 and be transported to the hospital emergency room.  If you develope a fever above 101 F, pus (white drainage) or increased drainage or redness at the wound, or calf pain, call your surgeon's office.   Call MD / Call 911   Complete by:  As directed    If you experience chest pain or shortness of breath, CALL 911 and be transported to the hospital emergency room.  If you develope a fever above 101 F, pus (white drainage) or increased drainage or redness at the wound, or calf pain, call your surgeon's office.   Constipation Prevention   Complete by:  As directed    Drink plenty of fluids.  Prune juice may be helpful.  You may use a stool softener, such as Colace (over the counter) 100 mg twice a day.  Use MiraLax (over the counter) for constipation as needed.   Constipation Prevention   Complete by:  As directed    Drink plenty of fluids.  Prune juice may be helpful.  You may use a stool softener, such as Colace (over the counter) 100 mg twice a day.  Use MiraLax (over the counter) for constipation as needed.   Diet - low sodium heart healthy   Complete by:  As directed    Diet - low sodium heart healthy   Complete by:  As directed    Increase activity slowly as tolerated   Complete by:  As directed    Increase activity slowly as tolerated   Complete by:  As directed    Negative Pressure  Wound Therapy - Incisional   Complete by:  As directed    Continue wound VAC for 1 week.   Negative Pressure Wound Therapy - Incisional   Complete by:  As directed    Insert new VAC canister prior to discharge     Follow-up Information    [name], MD In 1 week.   Specialty:  Orthopedic Surgery Contact information: [place] [address] [phone]            Signed: [name] 11/02/2017,  9:38 AM

## 2017-11-04 DIAGNOSIS — I2510 Atherosclerotic heart disease of native coronary artery without angina pectoris: Secondary | ICD-10-CM | POA: Diagnosis not present

## 2017-11-04 DIAGNOSIS — Z87891 Personal history of nicotine dependence: Secondary | ICD-10-CM | POA: Diagnosis not present

## 2017-11-04 DIAGNOSIS — E1169 Type 2 diabetes mellitus with other specified complication: Secondary | ICD-10-CM | POA: Diagnosis not present

## 2017-11-04 DIAGNOSIS — T8131XD Disruption of external operation (surgical) wound, not elsewhere classified, subsequent encounter: Secondary | ICD-10-CM | POA: Diagnosis not present

## 2017-11-04 DIAGNOSIS — Z89511 Acquired absence of right leg below knee: Secondary | ICD-10-CM | POA: Diagnosis not present

## 2017-11-04 DIAGNOSIS — M1990 Unspecified osteoarthritis, unspecified site: Secondary | ICD-10-CM | POA: Diagnosis not present

## 2017-11-04 DIAGNOSIS — Z7984 Long term (current) use of oral hypoglycemic drugs: Secondary | ICD-10-CM | POA: Diagnosis not present

## 2017-11-04 DIAGNOSIS — I10 Essential (primary) hypertension: Secondary | ICD-10-CM | POA: Diagnosis not present

## 2017-11-04 DIAGNOSIS — M86261 Subacute osteomyelitis, right tibia and fibula: Secondary | ICD-10-CM | POA: Diagnosis not present

## 2017-11-05 ENCOUNTER — Telehealth (INDEPENDENT_AMBULATORY_CARE_PROVIDER_SITE_OTHER): Payer: Self-pay

## 2017-11-05 NOTE — Telephone Encounter (Signed)
Jim, HHPT with [facility] called stating that patient was seen on yesterday 11/04/17 and stated that he refuses any additional PT.  Patient will be discharged and if he needs PT later in the future, then he would need a new referral and it would have to be by patient request.  Cb# is [phone].

## 2017-11-06 ENCOUNTER — Ambulatory Visit (INDEPENDENT_AMBULATORY_CARE_PROVIDER_SITE_OTHER): Payer: Medicare Other | Admitting: Family

## 2017-11-06 ENCOUNTER — Encounter (INDEPENDENT_AMBULATORY_CARE_PROVIDER_SITE_OTHER): Payer: Self-pay | Admitting: Family

## 2017-11-06 ENCOUNTER — Inpatient Hospital Stay (INDEPENDENT_AMBULATORY_CARE_PROVIDER_SITE_OTHER): Payer: Medicare Other | Admitting: Family

## 2017-11-06 DIAGNOSIS — IMO0002 Reserved for concepts with insufficient information to code with codable children: Secondary | ICD-10-CM

## 2017-11-06 DIAGNOSIS — T8781 Dehiscence of amputation stump: Secondary | ICD-10-CM

## 2017-11-06 DIAGNOSIS — Z89511 Acquired absence of right leg below knee: Secondary | ICD-10-CM

## 2017-11-06 MED ORDER — HYDROCODONE-ACETAMINOPHEN 5-325 MG PO TABS: 1.0000 | ORAL_TABLET | ORAL | 0 refills | Status: DC | PRN

## 2017-11-06 NOTE — Telephone Encounter (Signed)
Dr. Duda is aware.  

## 2017-11-06 NOTE — Progress Notes (Signed)
Post-Op Visit Note   Patient: [name]           Date of Birth: August 10, 1940           MRN: [id number] Visit Date: 11/06/2017 PCP: Dorena Cookey, MD  Chief Complaint:  Chief Complaint  Patient presents with  . Right Knee - Routine Post Op, Follow-up    HPI:  HPI The patient is a 77 year old gentleman seen 1 week status post revision of right below knee amputation.  Ortho Exam Incision is well approximated with sutures. No drainge. No erythema. No odor. No sign of infection.  Visit Diagnoses:  1. Below knee amputation status, right ([place])   2. Dehiscence of amputation stump ([place])     Plan: follow up in office in 2 weeks for suture removal. Begin daily dial soap cleansing. Dry dressings. Use shrinker.   Follow-Up Instructions: No follow-ups on file.   Imaging: No results found.  Orders:  No orders of the defined types were placed in this encounter.  Meds ordered this encounter  Medications  . HYDROcodone-acetaminophen (NORCO/VICODIN) 5-325 MG tablet    Sig: Take 1 tablet by mouth every 4 (four) hours as needed for moderate pain.    Dispense:  42 tablet    Refill:  0     PMFS History: Patient Active Problem List   Diagnosis Date Noted  . Below knee amputation status, right ([place]) 10/30/2017  . Dehiscence of amputation stump ([place])   . Nonischemic cardiomyopathy ([place]) 09/03/2017  . AVNRT (AV nodal re-entry tachycardia) ([place]) 01/30/2017  . Chronic midline low back pain without sciatica 01/03/2017  . Diabetic polyneuropathy associated with type 2 diabetes mellitus ([place]) 05/31/2016  . H/O amputation of leg through tibia and fibula, right ([place]) 05/03/2016  . Phantom limb pain ([place]) 03/06/2016  . Heat rash 03/06/2016  . SVT (supraventricular tachycardia) ([place]) 01/20/2016  . Chronic systolic heart failure ([place]) 01/20/2016  . Sepsis ([place]) 12/10/2015  . Diabetic foot ulcers ([place]) 07/12/2015  . Dysphagia 08/06/2014  . Anxiety state 10/26/2013  . PVC (premature  ventricular contraction) 07/27/2011  . Uncontrolled type 2 diabetes mellitus with foot ulcer, without long-term current use of insulin ([place]) 11/02/2010  . BASAL CELL CARCINOMA, FACE 10/18/2008  . BASAL CELL CARCINOMA, FACE 10/18/2008  . LIPOMA, SKIN 05/31/2008  . DRY SKIN 05/31/2008  . ERECTILE DYSFUNCTION 09/30/2007  . OSTEOARTHRITIS 08/08/2007  . Essential hypertension 03/24/2007   Past Medical History:  Diagnosis Date  . CAD (coronary artery disease)    Minimal plaque cath 2008  . Cancer ([place])    skin cancer - basal  . Cataract    both  . Complication of anesthesia   . Diabetes mellitus    Type II  . ED (erectile dysfunction)   . History of kidney stones    x1  . Hypertension   . Osteoarthritis   . PONV (postoperative nausea and vomiting) 1960   with Ether  . PVC's (premature ventricular contractions)   . Rheumatic fever   . Rhinitis     Family History  Problem Relation Age of Onset  . Stroke Father 49       Died age 30  . Coronary artery disease Brother 6  . Colon cancer Neg Hx   . Colon polyps Neg Hx   . Diabetes Neg Hx   . Esophageal cancer Neg Hx   . Kidney disease Neg Hx   . Gallbladder disease Neg Hx     Past  Surgical History:  Procedure Laterality Date  . AMPUTATION Right 02/29/2016   Procedure: Right Leg AMPUTATION BELOW KNEE with Wound Vac placement;  Surgeon: [name], MD;  Location: [place];  Service: Orthopedics;  Laterality: Right;  . CHOLECYSTECTOMY  1983  . colonoscopy  2006, 2010  . EYE SURGERY Bilateral    cataract  . INGUINAL HERNIA REPAIR Right 1960  . STUMP REVISION Right 10/30/2017   Procedure: RIGHT BELOW KNEE AMPUTATION REVISION;  Surgeon: [name], MD;  Location: [place];  Service: Orthopedics;  Laterality: Right;   Social History   Occupational History  . Occupation: Curator - [employer]    Employer: RETIRED  Tobacco Use  . Smoking status: Former Smoker    Years: 20.00    Last attempt to quit: 07/26/1981     Years since quitting: 36.3  . Smokeless tobacco: Never Used  Substance and Sexual Activity  . Alcohol use: Not Currently  . Drug use: No  . Sexual activity: Never

## 2017-11-07 ENCOUNTER — Telehealth (INDEPENDENT_AMBULATORY_CARE_PROVIDER_SITE_OTHER): Payer: Self-pay | Admitting: Orthopedic Surgery

## 2017-11-07 ENCOUNTER — Encounter: Payer: Self-pay | Admitting: Family Medicine

## 2017-11-07 ENCOUNTER — Other Ambulatory Visit (INDEPENDENT_AMBULATORY_CARE_PROVIDER_SITE_OTHER): Payer: Self-pay | Admitting: Orthopedic Surgery

## 2017-11-07 ENCOUNTER — Ambulatory Visit (INDEPENDENT_AMBULATORY_CARE_PROVIDER_SITE_OTHER): Payer: Medicare Other | Admitting: Family Medicine

## 2017-11-07 VITALS — BP 120/70 | HR 62 | Temp 97.6°F | Wt 216.0 lb

## 2017-11-07 DIAGNOSIS — F419 Anxiety disorder, unspecified: Secondary | ICD-10-CM | POA: Diagnosis not present

## 2017-11-07 DIAGNOSIS — IMO0002 Reserved for concepts with insufficient information to code with codable children: Secondary | ICD-10-CM

## 2017-11-07 DIAGNOSIS — F329 Major depressive disorder, single episode, unspecified: Secondary | ICD-10-CM

## 2017-11-07 DIAGNOSIS — F32A Depression, unspecified: Secondary | ICD-10-CM

## 2017-11-07 DIAGNOSIS — Z89511 Acquired absence of right leg below knee: Secondary | ICD-10-CM | POA: Diagnosis not present

## 2017-11-07 MED ORDER — TRAMADOL HCL 50 MG PO TABS: 50.0000 mg | ORAL_TABLET | Freq: Four times a day (QID) | ORAL | 0 refills | Status: DC | PRN

## 2017-11-07 NOTE — Progress Notes (Signed)
Subjective:    Patient ID: [name], male    DOB: March 04, 1941, 77 y.o.   MRN: [id number]  No chief complaint on file.   HPI Patient was seen today for acute concern.  Pt endorses anxiety and depression.  Pt had R BKA 10/30/2017 with revision 2/2 dehiscence and osteomyelitis.    Pt states he feels like his medicines are making him anxious/"uneasy" every am.  Pt states his surgeon changed his med from percocet to norco, but he still feels anxious.  Pt stopped taking both.  Using tylenol for pain.  States a rx for Tramadol was sent in, but he has to pick it up.  Pt also feels his Carvedilol is causing urinary frequency and tiredness. Pt has h/o DM 2, fsbs 100-115 per memory.  Pt has gone through the loss of his wife this past yr and the loss of his leg.  Pt is attending a group grief class.  Pt also endorses frustration at lack of independence/ not having control of the timing of his meds while being in the hospital.   Past Medical History:  Diagnosis Date  . CAD (coronary artery disease)    Minimal plaque cath 2008  . Cancer ([place])    skin cancer - basal  . Cataract    both  . Complication of anesthesia   . Diabetes mellitus    Type II  . ED (erectile dysfunction)   . History of kidney stones    x1  . Hypertension   . Osteoarthritis   . PONV (postoperative nausea and vomiting) 1960   with Ether  . PVC's (premature ventricular contractions)   . Rheumatic fever   . Rhinitis     No Known Allergies  ROS General: Denies fever, chills, night sweats, changes in weight, changes in appetite HEENT: Denies headaches, ear pain, changes in vision, rhinorrhea, sore throat CV: Denies CP, palpitations, SOB, orthopnea Pulm: Denies SOB, cough, wheezing GI: Denies abdominal pain, nausea, vomiting, diarrhea, constipation GU: Denies dysuria, hematuria  + urinary frequency Msk: Denies muscle cramps, joint pains  +R BKA Neuro: Denies weakness, numbness, tingling Skin: Denies rashes,  bruising Psych: Denies depression, hallucinations  +anxiety     Objective:    Blood pressure 120/70, pulse 62, temperature 97.6 F (36.4 C), temperature source Oral, weight 216 lb (98 kg), SpO2 98 %.   Gen. Pleasant, well-nourished, in no distress, normal affect  HEENT: [place]/AT, face symmetric, no scleral icterus, PERRLA, nares patent without drainage, pharynx without erythema or exudate. Cardiovascular: RRR, no peripheral edema Musculoskeletal: R BKA, stump shrinker in place. Neuro:  A&Ox3, CN II-XII intact, sitting in wheelchair   Wt Readings from Last 3 Encounters:  11/07/17 216 lb (98 kg)  10/30/17 219 lb (99.3 kg)  10/16/17 219 lb (99.3 kg)    Lab Results  Component Value Date   WBC 6.7 10/30/2017   HGB 13.7 10/30/2017   HCT 41.7 10/30/2017   PLT 211 10/30/2017   GLUCOSE 121 (H) 10/30/2017   CHOL 145 09/10/2016   TRIG 212.0 (H) 09/10/2016   HDL 31.30 (L) 09/10/2016   LDLDIRECT 84.0 09/10/2016   LDLCALC 73 09/14/2014   ALT 13 09/10/2016   AST 12 09/10/2016   NA 139 10/30/2017   K 4.2 10/30/2017   CL 109 10/30/2017   CREATININE 0.67 10/30/2017   BUN 13 10/30/2017   CO2 22 10/30/2017   TSH 1.80 09/10/2016   PSA 0.36 11/20/2011   INR 1.18 12/10/2015   HGBA1C 5.9 (H)  10/30/2017   MICROALBUR 0.7 09/11/2016    Assessment/Plan:  Below knee amputation status, right ([place]) -continue stump shrinker, keep area clean and dry. -continue f/u with [facility] -pt encourage to try Tramadol to see if still feeling anxious in the am. -can also use Tylenol prn  Anxiety and depression -PHQ 9 score 12 -Gad 7 score 7 -Pt encouraged to express concerns regarding medications with prescribing providers.  Pt has an appointment with cardiology on Tuesday. -Pt given information regarding counseling in the area. Pt wishes to wait on this as he thinks medication is the primary cause. -We will readdress at each visit  Follow-up PRN  Grier Mitts, MD

## 2017-11-07 NOTE — Patient Instructions (Signed)
Living With an Amputation The most common causes of amputation from the hip down include:  Diseases that: ? Reduce the blood flow to an area of your body. ? Decrease your body's ability to fight infection.  Traumatic injuries that cause significant damage to body tissues.  Birth defects.  Cancerous lumps (malignant tumors). Amputation above the hip is usually the result of trauma or birth defect. Disease is a less common cause. Living with an amputation can be challenging, but you can still live a long, productive life. WHAT ARE THE COMMON CHALLENGES OF LIVING WITH AN AMPUTATION? The most common challenges are mobility and self-care. With some new habits, though, it is often possible to do all of the activities you used to do. You may be able to use a device that substitutes for your limb (prosthesis). The prosthesis helps you adapt more quickly to these challenges. Your health care provider can help select a prosthesis to meet your needs. A person who helps you choose and fits you with a prosthesis (prosthetist) may also help. A rehabilitation program can also help you gain mobility and self-reliance. Your rehabilitation team may include:  Physicians.  Physical and occupational therapists.  Prosthetists.  Nurses.  Social workers.  Psychologists.  Dietitians. Your rehabilitation team will help you with all aspects of recovery and returning to work, home, sports, and your community. You will learn to:  Get around safely.  Adjust your home.  Exercise.  Use a prosthetic.  Work through [employer].  Connect with other people who have gone through the same experience. Additional challenges may include:  Grieving period.  Body image issues.  Lifestyle issues, such as sex.  Maintaining a healthy weight. These issues are normal. Discuss these with your rehabilitation team. WHEN CAN I RETURN TO MY REGULAR ACTIVITIES?  Returning to your normal activities is part  of healing. Changes can often be made to equipment that allow you to return to a sport or hobby. Some Teacher, English as a foreign language for this. Discuss all of your leisure interests with your health care provider and prosthetist. WHEN CAN I RETURN TO WORK? When you are ready to return to work, your therapists can perform job site evaluations and make recommendations to help you perform your job. You may not be able to return to your same job. Your local Office of Vocational Rehabilitation can assist you in job retraining. FOR MORE INFORMATION: Visit these online resources. You can find tips on everything from getting dressed and using bathrooms to driving and travel considerations. These resources can also connect you to a network of emotional support, activities, and innovations. You may also search the Internet to find a local support group.  Amputee Coalition: http://www.amputee-coalition.org/ensuring-fall-safety/  Amputee Support Groups: http://amputee.supportgroups.com  Daily Strength: ItCheaper.dk  [employer]: http://www.nationalamputation.[facility] on Health, Physical Activity and Disability: http://www.nchpad.org  [employer] [place]: http://www.disabledsportsusa.org  American Academy of Orthotists and Prosthetists: http://www.oandp.org This information is not intended to replace advice given to you by your health care provider. Make sure you discuss any questions you have with your health care provider. Document Released: 02/03/2002 Document Revised: 06/04/2014 Document Reviewed: 09/29/2013 Elsevier Interactive Patient Education  2017 [place] After being diagnosed with an anxiety disorder, you may be relieved to know why you have felt or behaved a certain way. It is natural to also feel overwhelmed about the treatment ahead and what it will mean for your life. With care and support,  you can  manage this condition and recover from it. How to cope with anxiety Dealing with stress Stress is your body's reaction to life changes and events, both good and bad. Stress can last just a few hours or it can be ongoing. Stress can play a major role in anxiety, so it is important to learn both how to cope with stress and how to think about it differently. Talk with your health care provider or a counselor to learn more about stress reduction. He or she may suggest some stress reduction techniques, such as:  Music therapy. This can include creating or listening to music that you enjoy and that inspires you.  Mindfulness-based meditation. This involves being aware of your normal breaths, rather than trying to control your breathing. It can be done while sitting or walking.  Centering prayer. This is a kind of meditation that involves focusing on a word, phrase, or sacred image that is meaningful to you and that brings you peace.  Deep breathing. To do this, expand your stomach and inhale slowly through your nose. Hold your breath for 3-5 seconds. Then exhale slowly, allowing your stomach muscles to relax.  Self-talk. This is a skill where you identify thought patterns that lead to anxiety reactions and correct those thoughts.  Muscle relaxation. This involves tensing muscles then relaxing them.  Choose a stress reduction technique that fits your lifestyle and personality. Stress reduction techniques take time and practice. Set aside 5-15 minutes a day to do them. Therapists can offer training in these techniques. The training may be covered by some insurance plans. Other things you can do to manage stress include:  Keeping a stress diary. This can help you learn what triggers your stress and ways to control your response.  Thinking about how you respond to certain situations. You may not be able to control everything, but you can control your reaction.  Making time for activities that  help you relax, and not feeling guilty about spending your time in this way.  Therapy combined with coping and stress-reduction skills provides the best chance for successful treatment. Medicines Medicines can help ease symptoms. Medicines for anxiety include:  Anti-anxiety drugs.  Antidepressants.  Beta-blockers.  Medicines may be used as the main treatment for anxiety disorder, along with therapy, or if other treatments are not working. Medicines should be prescribed by a health care provider. Relationships Relationships can play a big part in helping you recover. Try to spend more time connecting with trusted friends and family members. Consider going to couples counseling, taking family education classes, or going to family therapy. Therapy can help you and others better understand the condition. How to recognize changes in your condition Everyone has a different response to treatment for anxiety. Recovery from anxiety happens when symptoms decrease and stop interfering with your daily activities at home or work. This may mean that you will start to:  Have better concentration and focus.  Sleep better.  Be less irritable.  Have more energy.  Have improved memory.  It is important to recognize when your condition is getting worse. Contact your health care provider if your symptoms interfere with home or work and you do not feel like your condition is improving. Where to find help and support: You can get help and support from these sources:  Self-help groups.  Online and [employer].  A trusted spiritual leader.  Couples counseling.  Family education classes.  Family therapy.  Follow these instructions at home:  Eat a healthy  diet that includes plenty of vegetables, fruits, whole grains, low-fat dairy products, and lean protein. Do not eat a lot of foods that are high in solid fats, added sugars, or salt.  Exercise. Most adults should do the  following: ? Exercise for at least 150 minutes each week. The exercise should increase your heart rate and make you sweat (moderate-intensity exercise). ? Strengthening exercises at least twice a week.  Cut down on caffeine, tobacco, alcohol, and other potentially harmful substances.  Get the right amount and quality of sleep. Most adults need 7-9 hours of sleep each night.  Make choices that simplify your life.  Take over-the-counter and prescription medicines only as told by your health care provider.  Avoid caffeine, alcohol, and certain over-the-counter cold medicines. These may make you feel worse. Ask your pharmacist which medicines to avoid.  Keep all follow-up visits as told by your health care provider. This is important. Questions to ask your health care provider  Would I benefit from therapy?  How often should I follow up with a health care provider?  How long do I need to take medicine?  Are there any long-term side effects of my medicine?  Are there any alternatives to taking medicine? Contact a health care provider if:  You have a hard time staying focused or finishing daily tasks.  You spend many hours a day feeling worried about everyday life.  You become exhausted by worry.  You start to have headaches, feel tense, or have nausea.  You urinate more than normal.  You have diarrhea. Get help right away if:  You have a racing heart and shortness of breath.  You have thoughts of hurting yourself or others. If you ever feel like you may hurt yourself or others, or have thoughts about taking your own life, get help right away. You can go to your nearest emergency department or call:  Your local emergency services (911 in the [place]  A suicide crisis helpline, such as the [place] at (762) 476-4689. This is open 24-hours a day.  Summary  Taking steps to deal with stress can help calm you.  Medicines cannot cure anxiety  disorders, but they can help ease symptoms.  Family, friends, and partners can play a big part in helping you recover from an anxiety disorder. This information is not intended to replace advice given to you by your health care provider. Make sure you discuss any questions you have with your health care provider. Document Released: 05/08/2016 Document Revised: 05/08/2016 Document Reviewed: 05/08/2016 Elsevier Interactive Patient Education  2018 [place] With Depression Everyone experiences occasional disappointment, sadness, and loss in their lives. When you are feeling down, blue, or sad for at least 2 weeks in a row, it may mean that you have depression. Depression can affect your thoughts and feelings, relationships, daily activities, and physical health. It is caused by changes in the way your brain functions. If you receive a diagnosis of depression, your health care provider will tell you which type of depression you have and what treatment options are available to you. If you are living with depression, there are ways to help you recover from it and also ways to prevent it from coming back. How to cope with lifestyle changes Coping with stress Stress is your body's reaction to life changes and events, both good and bad. Stressful situations may include:  Getting married.  The death of a spouse.  Losing a job.  Retiring.  Having a baby.  Stress can last just a few hours or it can be ongoing. Stress can play a major role in depression, so it is important to learn both how to cope with stress and how to think about it differently. Talk with your health care provider or a counselor if you would like to learn more about stress reduction. He or she may suggest some stress reduction techniques, such as:  Music therapy. This can include creating music or listening to music. Choose music that you enjoy and that inspires you.  Mindfulness-based meditation. This kind of  meditation can be done while sitting or walking. It involves being aware of your normal breaths, rather than trying to control your breathing.  Centering prayer. This is a kind of meditation that involves focusing on a spiritual word or phrase. Choose a word, phrase, or sacred image that is meaningful to you and that brings you peace.  Deep breathing. To do this, expand your stomach and inhale slowly through your nose. Hold your breath for 3-5 seconds, then exhale slowly, allowing your stomach muscles to relax.  Muscle relaxation. This involves intentionally tensing muscles then relaxing them.  Choose a stress reduction technique that fits your lifestyle and personality. Stress reduction techniques take time and practice to develop. Set aside 5-15 minutes a day to do them. Therapists can offer training in these techniques. The training may be covered by some insurance plans. Other things you can do to manage stress include:  Keeping a stress diary. This can help you learn what triggers your stress and ways to control your response.  Understanding what your limits are and saying no to requests or events that lead to a schedule that is too full.  Thinking about how you respond to certain situations. You may not be able to control everything, but you can control how you react.  Adding humor to your life by watching funny films or TV shows.  Making time for activities that help you relax and not feeling guilty about spending your time this way.  Medicines Your health care provider may suggest certain medicines if he or she feels that they will help improve your condition. Avoid using alcohol and other substances that may prevent your medicines from working properly (may interact). It is also important to:  Talk with your pharmacist or health care provider about all the medicines that you take, their possible side effects, and what medicines are safe to take together.  Make it your goal to take  part in all treatment decisions (shared decision-making). This includes giving input on the side effects of medicines. It is best if shared decision-making with your health care provider is part of your total treatment plan.  If your health care provider prescribes a medicine, you may not notice the full benefits of it for 4-8 weeks. Most people who are treated for depression need to be on medicine for at least 6-12 months after they feel better. If you are taking medicines as part of your treatment, do not stop taking medicines without first talking to your health care provider. You may need to have the medicine slowly decreased (tapered) over time to decrease the risk of harmful side effects. Relationships Your health care provider may suggest family therapy along with individual therapy and drug therapy. While there may not be family problems that are causing you to feel depressed, it is still important to make sure your family learns as much as they can about your mental  health. Having your family's support can help make your treatment successful. How to recognize changes in your condition Everyone has a different response to treatment for depression. Recovery from major depression happens when you have not had signs of major depression for two months. This may mean that you will start to:  Have more interest in doing activities.  Feel less hopeless than you did 2 months ago.  Have more energy.  Overeat less often, or have better or improving appetite.  Have better concentration.  Your health care provider will work with you to decide the next steps in your recovery. It is also important to recognize when your condition is getting worse. Watch for these signs:  Having fatigue or low energy.  Eating too much or too little.  Sleeping too much or too little.  Feeling restless, agitated, or hopeless.  Having trouble concentrating or making decisions.  Having unexplained physical  complaints.  Feeling irritable, angry, or aggressive.  Get help as soon as you or your family members notice these symptoms coming back. How to get support and help from others How to talk with friends and family members about your condition Talking to friends and family members about your condition can provide you with one way to get support and guidance. Reach out to trusted friends or family members, explain your symptoms to them, and let them know that you are working with a health care provider to treat your depression. Financial resources Not all insurance plans cover mental health care, so it is important to check with your insurance carrier. If paying for co-pays or counseling services is a problem, search for a local or county mental health care center. They may be able to offer public mental health care services at low or no cost when you are not able to see a private health care provider. If you are taking medicine for depression, you may be able to get the generic form, which may be less expensive. Some makers of prescription medicines also offer help to patients who cannot afford the medicines they need. Follow these instructions at home:  Get the right amount and quality of sleep.  Cut down on using caffeine, tobacco, alcohol, and other potentially harmful substances.  Try to exercise, such as walking or lifting small weights.  Take over-the-counter and prescription medicines only as told by your health care provider.  Eat a healthy diet that includes plenty of vegetables, fruits, whole grains, low-fat dairy products, and lean protein. Do not eat a lot of foods that are high in solid fats, added sugars, or salt.  Keep all follow-up visits as told by your health care provider. This is important. Contact a health care provider if:  You stop taking your antidepressant medicines, and you have any of these symptoms: ? Nausea. ? Headache. ? Feeling lightheaded. ? Chills and body  aches. ? Not being able to sleep (insomnia).  You or your friends and family think your depression is getting worse. Get help right away if:  You have thoughts of hurting yourself or others. If you ever feel like you may hurt yourself or others, or have thoughts about taking your own life, get help right away. You can go to your nearest emergency department or call:  Your local emergency services (911 in the [place]  A suicide crisis helpline, such as the [place] at 925 009 3909. This is open 24-hours a day.  Summary  If you are living with depression, there are ways  to help you recover from it and also ways to prevent it from coming back.  Work with your health care team to create a management plan that includes counseling, stress management techniques, and healthy lifestyle habits. This information is not intended to replace advice given to you by your health care provider. Make sure you discuss any questions you have with your health care provider. Document Released: 04/16/2016 Document Revised: 04/16/2016 Document Reviewed: 04/16/2016 Elsevier Interactive Patient Education  [employer].

## 2017-11-07 NOTE — Telephone Encounter (Signed)
Pt was given rx for Vicodin yesterday please see below and advise.

## 2017-11-07 NOTE — Telephone Encounter (Signed)
Rx written for tramadol

## 2017-11-07 NOTE — Telephone Encounter (Signed)
Patient called stating that the pain medication Erin prescribed to him yesterday make him feel like he could "jump out of his skin". He would like to be prescribed Tramadol, he said that's the only one that really works for him. He would like it today since he's in pain. CB # [phone]

## 2017-11-07 NOTE — Telephone Encounter (Signed)
Called pt to advise this has been called into his pharmacy

## 2017-11-08 ENCOUNTER — Ambulatory Visit (INDEPENDENT_AMBULATORY_CARE_PROVIDER_SITE_OTHER): Payer: Medicare Other | Admitting: Adult Health

## 2017-11-08 ENCOUNTER — Encounter (HOSPITAL_COMMUNITY): Payer: Self-pay

## 2017-11-08 ENCOUNTER — Other Ambulatory Visit: Payer: Self-pay

## 2017-11-08 ENCOUNTER — Emergency Department (HOSPITAL_COMMUNITY)
Admission: EM | Admit: 2017-11-08 | Discharge: 2017-11-08 | Disposition: A | Discharge status: Home / Self Care | Payer: Medicare Other | Attending: Emergency Medicine | Admitting: Emergency Medicine

## 2017-11-08 ENCOUNTER — Encounter: Payer: Self-pay | Admitting: Adult Health

## 2017-11-08 VITALS — BP 130/64 | Temp 98.4°F | Wt 215.0 lb

## 2017-11-08 DIAGNOSIS — R350 Frequency of micturition: Secondary | ICD-10-CM

## 2017-11-08 DIAGNOSIS — R351 Nocturia: Secondary | ICD-10-CM

## 2017-11-08 DIAGNOSIS — E119 Type 2 diabetes mellitus without complications: Secondary | ICD-10-CM | POA: Insufficient documentation

## 2017-11-08 DIAGNOSIS — Z7982 Long term (current) use of aspirin: Secondary | ICD-10-CM | POA: Diagnosis not present

## 2017-11-08 DIAGNOSIS — Z87891 Personal history of nicotine dependence: Secondary | ICD-10-CM | POA: Insufficient documentation

## 2017-11-08 DIAGNOSIS — L089 Local infection of the skin and subcutaneous tissue, unspecified: Secondary | ICD-10-CM | POA: Diagnosis not present

## 2017-11-08 DIAGNOSIS — I10 Essential (primary) hypertension: Secondary | ICD-10-CM | POA: Diagnosis not present

## 2017-11-08 DIAGNOSIS — Z79899 Other long term (current) drug therapy: Secondary | ICD-10-CM | POA: Insufficient documentation

## 2017-11-08 DIAGNOSIS — F419 Anxiety disorder, unspecified: Secondary | ICD-10-CM | POA: Diagnosis not present

## 2017-11-08 DIAGNOSIS — N401 Enlarged prostate with lower urinary tract symptoms: Secondary | ICD-10-CM

## 2017-11-08 DIAGNOSIS — T8140XA Infection following a procedure, unspecified, initial encounter: Secondary | ICD-10-CM | POA: Diagnosis not present

## 2017-11-08 DIAGNOSIS — T8149XA Infection following a procedure, other surgical site, initial encounter: Secondary | ICD-10-CM

## 2017-11-08 DIAGNOSIS — I2510 Atherosclerotic heart disease of native coronary artery without angina pectoris: Secondary | ICD-10-CM | POA: Insufficient documentation

## 2017-11-08 LAB — COMPREHENSIVE METABOLIC PANEL
ALT: 12 U/L — ABNORMAL LOW (ref 17–63)
AST: 17 U/L (ref 15–41)
Albumin: 3.6 g/dL (ref 3.5–5.0)
Alkaline Phosphatase: 66 U/L (ref 38–126)
Anion gap: 7 (ref 5–15)
BUN: 11 mg/dL (ref 6–20)
CO2: 25 mmol/L (ref 22–32)
Calcium: 8.7 mg/dL — ABNORMAL LOW (ref 8.9–10.3)
Chloride: 105 mmol/L (ref 101–111)
Creatinine, Ser: 0.81 mg/dL (ref 0.61–1.24)
GFR calc Af Amer: >60 mL/min (ref >60)
GFR calc non Af Amer: >60 mL/min (ref >60)
Glucose, Bld: 195 mg/dL — ABNORMAL HIGH (ref 65–99)
Potassium: 4.1 mmol/L (ref 3.5–5.1)
Sodium: 137 mmol/L (ref 135–145)
Total Bilirubin: 0.8 mg/dL (ref 0.3–1.2)
Total Protein: 6.6 g/dL (ref 6.5–8.1)

## 2017-11-08 LAB — POC URINALSYSI DIPSTICK (AUTOMATED)
Bilirubin, UA: NEGATIVE
Glucose, UA: NEGATIVE
Ketones, UA: NEGATIVE
Leukocytes, UA: NEGATIVE
Nitrite, UA: NEGATIVE
Protein, UA: NEGATIVE
Spec Grav, UA: 1.025 (ref 1.010–1.025)
Urobilinogen, UA: 0.2 E.U./dL
pH, UA: 6 (ref 5.0–8.0)

## 2017-11-08 LAB — CBC WITH DIFFERENTIAL/PLATELET
Abs Immature Granulocytes: 0 10*3/uL (ref 0.0–0.1)
Basophils Absolute: 0.1 10*3/uL (ref 0.0–0.1)
Basophils Relative: 1 %
Eosinophils Absolute: 0.5 10*3/uL (ref 0.0–0.7)
Eosinophils Relative: 6 %
HCT: 38.5 % — ABNORMAL LOW (ref 39.0–52.0)
Hemoglobin: 12.4 g/dL — ABNORMAL LOW (ref 13.0–17.0)
Immature Granulocytes: 1 %
Lymphocytes Relative: 17 %
Lymphs Abs: 1.4 10*3/uL (ref 0.7–4.0)
MCH: 28.6 pg (ref 26.0–34.0)
MCHC: 32.2 g/dL (ref 30.0–36.0)
MCV: 88.7 fL (ref 78.0–100.0)
Monocytes Absolute: 0.6 10*3/uL (ref 0.1–1.0)
Monocytes Relative: 7 %
Neutro Abs: 5.7 10*3/uL (ref 1.7–7.7)
Neutrophils Relative %: 68 %
Platelets: 268 10*3/uL (ref 150–400)
RBC: 4.34 MIL/uL (ref 4.22–5.81)
RDW: 12.7 % (ref 11.5–15.5)
WBC: 8.4 10*3/uL (ref 4.0–10.5)

## 2017-11-08 LAB — I-STAT CG4 LACTIC ACID, ED: Lactic Acid, Venous: 2.41 mmol/L (ref 0.5–1.9)

## 2017-11-08 MED ORDER — DOXYCYCLINE HYCLATE 100 MG PO TABS
100.0000 mg | ORAL_TABLET | Freq: Once | ORAL | Status: AC
  Administered 2017-11-08: 100 mg via ORAL
  Filled 2017-11-08: qty 1

## 2017-11-08 MED ORDER — TAMSULOSIN HCL 0.4 MG PO CAPS: 0.4000 mg | ORAL_CAPSULE | Freq: Every day | ORAL | 3 refills | Status: DC

## 2017-11-08 MED ORDER — VANCOMYCIN HCL IN DEXTROSE 1-5 GM/200ML-% IV SOLN
1000.0000 mg | Freq: Once | INTRAVENOUS | Status: DC
  Filled 2017-11-08: qty 200

## 2017-11-08 MED ORDER — PIPERACILLIN-TAZOBACTAM 3.375 G IVPB 30 MIN
3.3750 g | Freq: Once | INTRAVENOUS | Status: AC
  Administered 2017-11-08: 3.375 g via INTRAVENOUS
  Filled 2017-11-08: qty 50

## 2017-11-08 MED ORDER — LORAZEPAM 0.5 MG PO TABS: 0.5000 mg | ORAL_TABLET | Freq: Three times a day (TID) | ORAL | 0 refills | Status: DC | PRN

## 2017-11-08 MED ORDER — DOXYCYCLINE HYCLATE 100 MG PO CAPS: 100.0000 mg | ORAL_CAPSULE | Freq: Two times a day (BID) | ORAL | 0 refills | Status: DC

## 2017-11-08 NOTE — Progress Notes (Signed)
Subjective:    Patient ID: [name], male    DOB: 1941-04-24, 77 y.o.   MRN: [id number]  HPI 77 year old male who  has a past medical history of CAD (coronary artery disease), Cancer ([place]), Cataract, Complication of anesthesia, Diabetes mellitus, ED (erectile dysfunction), History of kidney stones, Hypertension, Osteoarthritis, PONV (postoperative nausea and vomiting) (1960), PVC's (premature ventricular contractions), Rheumatic fever, and Rhinitis.   He presents to the office today for multiple complaints.  Anxiety and depression-revision of right BKA on 11/06/2017 and has continued to mourn the loss of his wife over this past year.  This in combination with loss of freedom/independence due to loss of his leg have made him feel anxious" on the verge of depression".  He was prescribed Ativan by his PCP at one point in time but that that prescription had expired.  He is asking respectively to get a short course of this medication to help him get through this tough time.  BPH with a nocturia-has been an ongoing issue for at least 1 year at this point in time.  Those symptoms are getting worse and is unsure if it is the change of medications that have been causing increased urination.  The other cyst symptoms include decreased stream, nocturia, and feeling as though he is not emptying his bladder completely.  He denies any dysuria, hematuria, painful bowel movement.  He would like to start the medication that "we talked about at one point in time."  Fortunately today he signed himself out of the emergency room in order to come here and talk to me about these issues.  He was in the emergency room for redness and a large fluid blister that had developed on his stump.  He had called Dr. Jess Barters office but no one is available to see him for follow-up and he was advised to go to the emergency room.  Denies any fevers or chills.  The ER physician had talked to Dr. Sharol Given who recommended initiation of  antibiotics and would not let the nurses allow placement of IV.  Dr. due to advised for 1 dose IV Zosyn and starting the patient on doxycycline twice a day.  He has a follow-up with Dr. due to on Monday  Review of Systems See HPI   Past Medical History:  Diagnosis Date  . CAD (coronary artery disease)    Minimal plaque cath 2008  . Cancer ([place])    skin cancer - basal  . Cataract    both  . Complication of anesthesia   . Diabetes mellitus    Type II  . ED (erectile dysfunction)   . History of kidney stones    x1  . Hypertension   . Osteoarthritis   . PONV (postoperative nausea and vomiting) 1960   with Ether  . PVC's (premature ventricular contractions)   . Rheumatic fever   . Rhinitis     Social History   Socioeconomic History  . Marital status: Married    Spouse name: Not on file  . Number of children: 1  . Years of education: 16+  . Highest education level: Not on file  Occupational History  . Occupation: Curator - [employer]    Employer: RETIRED  Social Needs  . Financial resource strain: Not on file  . Food insecurity:    Worry: Not on file    Inability: Not on file  . Transportation needs:    Medical: Not on file  Non-medical: Not on file  Tobacco Use  . Smoking status: Former Smoker    Years: 20.00    Last attempt to quit: 07/26/1981    Years since quitting: 36.3  . Smokeless tobacco: Never Used  Substance and Sexual Activity  . Alcohol use: Not Currently  . Drug use: No  . Sexual activity: Never  Lifestyle  . Physical activity:    Days per week: Not on file    Minutes per session: Not on file  . Stress: Not on file  Relationships  . Social connections:    Talks on phone: Not on file    Gets together: Not on file    Attends religious service: Not on file    Active member of club or organization: Not on file    Attends meetings of clubs or organizations: Not on file    Relationship status: Not on file  . Intimate partner  violence:    Fear of current or ex partner: Not on file    Emotionally abused: Not on file    Physically abused: Not on file    Forced sexual activity: Not on file  Other Topics Concern  . Not on file  Social History Narrative   Lives with wife.   epworth sleepiness scale = 5 (01/20/16)    Past Surgical History:  Procedure Laterality Date  . AMPUTATION Right 02/29/2016   Procedure: Right Leg AMPUTATION BELOW KNEE with Wound Vac placement;  Surgeon: [name], MD;  Location: [facility];  Service: Orthopedics;  Laterality: Right;  . CHOLECYSTECTOMY  1983  . colonoscopy  2006, 2010  . EYE SURGERY Bilateral    cataract  . INGUINAL HERNIA REPAIR Right 1960  . STUMP REVISION Right 10/30/2017   Procedure: RIGHT BELOW KNEE AMPUTATION REVISION;  Surgeon: [name], MD;  Location: [facility];  Service: Orthopedics;  Laterality: Right;    Family History  Problem Relation Age of Onset  . Stroke Father 11       Died age 53  . Coronary artery disease Brother 70  . Colon cancer Neg Hx   . Colon polyps Neg Hx   . Diabetes Neg Hx   . Esophageal cancer Neg Hx   . Kidney disease Neg Hx   . Gallbladder disease Neg Hx     No Known Allergies  Current Outpatient Medications on File Prior to Visit  Medication Sig Dispense Refill  . aspirin EC 325 MG tablet Take 325 mg by mouth daily. May take an additional 325 or 650 mg twice daily as needed for pain / headaches    . aspirin-sod bicarb-citric acid (ALKA-SELTZER) 325 MG TBEF tablet Take 650 mg by mouth daily as needed (heart burn).    . carvedilol (COREG) 12.5 MG tablet TAKE 1 TABLET (12.5 MG TOTAL) BY MOUTH 2 (TWO) TIMES DAILY. 60 tablet 5  . gabapentin (NEURONTIN) 100 MG capsule Take 1 capsule (100 mg total) by mouth 3 (three) times daily. (Patient taking differently: Take 100 mg by mouth 3 (three) times daily as needed (pain). ) 270 capsule 3  . glucose blood (ACCU-CHEK AVIVA PLUS) test strip USE TO TEST DAILY AS INSTRUCTED PER MD**EMERGENCY FILL  MEDICAL REASON** 100 each 0  . halobetasol (ULTRAVATE) 0.05 % cream APPLY TO AFFECTED AREA TWICE A DAY 50 g 2  . hydrocortisone cream 1 % Apply 1 application topically daily as needed for itching.    . Lancets (ACCU-CHEK MULTICLIX) lancets USE TO TEST ONCE DAILY 102 each  2  . lisinopril (PRINIVIL,ZESTRIL) 5 MG tablet Take 1 tablet (5 mg total) by mouth daily. 90 tablet 3  . loratadine (CLARITIN) 10 MG tablet Take 10 mg by mouth daily.     . metFORMIN (GLUCOPHAGE) 1000 MG tablet TAKE 1 TABLET (1,000 MG TOTAL) BY MOUTH 2 (TWO) TIMES DAILY. 180 tablet 2  . methocarbamol (ROBAXIN) 500 MG tablet TAKE 1 TABLET EVERY 6 HOURS AS NEEDED MUSCLE SPASMS 30 tablet 0  . Multiple Vitamin (MULTIVITAMIN) tablet Take 1 tablet by mouth daily.    Marland Kitchen nystatin cream (MYCOSTATIN) APPLY TO AFFECTED AREA TWICE A DAY 30 g 2  . potassium chloride SA (K-DUR,KLOR-CON) 20 MEQ tablet TAKE 2 TABLETS IN THE MORNING 180 tablet 3  . Tetrahydrozoline HCl (VISINE OP) Place 1 drop into both eyes daily as needed (dry eyes).    . traMADol (ULTRAM) 50 MG tablet Take 1 tablet (50 mg total) by mouth every 6 (six) hours as needed for moderate pain. 60 tablet 0  . TURMERIC PO Take 1 tablet by mouth daily as needed (FOR LEG CRAMPS).      No current facility-administered medications on file prior to visit.     BP 130/64   Temp 98.4 F (36.9 C) (Oral)   Wt 215 lb (97.5 kg)   BMI 29.16 kg/m       Objective:   Physical Exam  Constitutional: He is oriented to person, place, and time. He appears well-developed and well-nourished. No distress.  Cardiovascular: Normal rate, regular rhythm, normal heart sounds and intact distal pulses. Exam reveals no gallop and no friction rub.  No murmur heard. Pulmonary/Chest: Effort normal and breath sounds normal. No stridor. No respiratory distress. He has no wheezes. He has no rales. He exhibits no tenderness.  Musculoskeletal:  Right BKA.    Neurological: He is alert and oriented to person,  place, and time.  Skin: Skin is warm. He is not diaphoretic. There is erythema.  See pictures of stump in ER visit from today + drainage   Psychiatric: He has a normal mood and affect. His behavior is normal. Judgment and thought content normal.  Nursing note and vitals reviewed.     Assessment & Plan:  1. Anxiety -As I would write for short course of Ativan as he has taken this in the past and it worked well for him. - LORazepam (ATIVAN) 0.5 MG tablet; Take 1 tablet (0.5 mg total) by mouth every 8 (eight) hours as needed for anxiety.  Dispense: 15 tablet; Refill: 0  2. Benign prostatic hyperplasia with nocturia -Start on Flomax 0.4 mg for BPH due to symptoms.  Also this was negative for infection, highly doubtful that he has prostatitis.  CBC in the emergency room was negative. - POCT Urinalysis Dipstick (Automated)- negative  - tamsulosin (FLOMAX) 0.4 MG CAPS capsule; Take 1 capsule (0.4 mg total) by mouth daily.  Dispense: 30 capsule; Refill: 3 - PSA  *Encourage follow-up in the emergency room for IV antibiotics due to concern for infection of right BKA-he was agreeable to this plan  Dorothyann Peng, NP

## 2017-11-08 NOTE — ED Notes (Signed)
Patient refusing to get undressed. States " I don't have that much time, and I have another appointment to go to." Rationale explained to patient. Patient still refusing. EDP aware

## 2017-11-08 NOTE — ED Notes (Signed)
Patient refusing iv, antibiotics, and blood draw at this time. EDP aware

## 2017-11-08 NOTE — ED Provider Notes (Signed)
[facility] EMERGENCY DEPARTMENT Provider Note   CSN: [id number] Arrival date & time: 11/08/17  1014     History   Chief Complaint No chief complaint on file.   HPI [name] is a 77 y.o. male.  HPI Patient has come for a wound check.  He had revision of right BKA due to osteomyelitis and infection.  Wound VAC came off day before yesterday.  Patient's daughter reports the wound looked good at that time.  They did a dressing change yesterday and started to notice minor changes.  Today there is much more redness and a large fluid blister has developed.  She tried to call Dr. Jess Barters office but no one was available for doing a follow-up appointment.  Advised to come to the emergency department.  No fever, chills, constitutional symptoms.  Patient reports he feels well.  Refused for nurses to remove his clothes or establish an IV.  He adamantly reports that he is leaving here for a scheduled outpatient doctor's appointment that he has early this afternoon. Past Medical History:  Diagnosis Date  . CAD (coronary artery disease)    Minimal plaque cath 2008  . Cancer ([place])    skin cancer - basal  . Cataract    both  . Complication of anesthesia   . Diabetes mellitus    Type II  . ED (erectile dysfunction)   . History of kidney stones    x1  . Hypertension   . Osteoarthritis   . PONV (postoperative nausea and vomiting) 1960   with Ether  . PVC's (premature ventricular contractions)   . Rheumatic fever   . Rhinitis     Patient Active Problem List   Diagnosis Date Noted  . Below knee amputation status, right ([place]) 10/30/2017  . Dehiscence of amputation stump ([place])   . Nonischemic cardiomyopathy ([place]) 09/03/2017  . AVNRT (AV nodal re-entry tachycardia) ([place]) 01/30/2017  . Chronic midline low back pain without sciatica 01/03/2017  . Diabetic polyneuropathy associated with type 2 diabetes mellitus ([place]) 05/31/2016  . H/O amputation of leg through tibia and  fibula, right ([place]) 05/03/2016  . Phantom limb pain ([place]) 03/06/2016  . Heat rash 03/06/2016  . SVT (supraventricular tachycardia) ([place]) 01/20/2016  . Chronic systolic heart failure ([place]) 01/20/2016  . Sepsis ([place]) 12/10/2015  . Diabetic foot ulcers ([place]) 07/12/2015  . Dysphagia 08/06/2014  . Anxiety state 10/26/2013  . PVC (premature ventricular contraction) 07/27/2011  . Uncontrolled type 2 diabetes mellitus with foot ulcer, without long-term current use of insulin ([place]) 11/02/2010  . BASAL CELL CARCINOMA, FACE 10/18/2008  . BASAL CELL CARCINOMA, FACE 10/18/2008  . LIPOMA, SKIN 05/31/2008  . DRY SKIN 05/31/2008  . ERECTILE DYSFUNCTION 09/30/2007  . OSTEOARTHRITIS 08/08/2007  . Essential hypertension 03/24/2007    Past Surgical History:  Procedure Laterality Date  . AMPUTATION Right 02/29/2016   Procedure: Right Leg AMPUTATION BELOW KNEE with Wound Vac placement;  Surgeon: [name], MD;  Location: [facility];  Service: Orthopedics;  Laterality: Right;  . CHOLECYSTECTOMY  1983  . colonoscopy  2006, 2010  . EYE SURGERY Bilateral    cataract  . INGUINAL HERNIA REPAIR Right 1960  . STUMP REVISION Right 10/30/2017   Procedure: RIGHT BELOW KNEE AMPUTATION REVISION;  Surgeon: [name], MD;  Location: [facility];  Service: Orthopedics;  Laterality: Right;        Home Medications    Prior to Admission medications   Medication Sig Start Date End  Date Taking? Authorizing Provider  aspirin EC 325 MG tablet Take 325 mg by mouth daily. May take an additional 325 or 650 mg twice daily as needed for pain / headaches   Yes [provider]  aspirin-sod bicarb-citric acid (ALKA-SELTZER) 325 MG TBEF tablet Take 650 mg by mouth daily as needed (heart burn).   Yes [provider]  carvedilol (COREG) 12.5 MG tablet TAKE 1 TABLET (12.5 MG TOTAL) BY MOUTH 2 (TWO) TIMES DAILY. 09/18/17 12/17/17 Yes Hilty, Nadean Corwin, MD  gabapentin (NEURONTIN) 100 MG capsule Take 1 capsule (100 mg  total) by mouth 3 (three) times daily. Patient taking differently: Take 100 mg by mouth 3 (three) times daily as needed (pain).  08/15/17  Yes [name], MD  halobetasol (ULTRAVATE) 0.05 % cream APPLY TO AFFECTED AREA TWICE A DAY 08/08/17  Yes Dorena Cookey, MD  hydrocortisone cream 1 % Apply 1 application topically daily as needed for itching.   Yes [provider]  lisinopril (PRINIVIL,ZESTRIL) 5 MG tablet Take 1 tablet (5 mg total) by mouth daily. 10/24/17 01/22/18 Yes Hilty, Nadean Corwin, MD  loratadine (CLARITIN) 10 MG tablet Take 10 mg by mouth daily.    Yes [provider]  metFORMIN (GLUCOPHAGE) 1000 MG tablet TAKE 1 TABLET (1,000 MG TOTAL) BY MOUTH 2 (TWO) TIMES DAILY. 11/01/17  Yes Dorena Cookey, MD  methocarbamol (ROBAXIN) 500 MG tablet TAKE 1 TABLET EVERY 6 HOURS AS NEEDED MUSCLE SPASMS 09/12/17  Yes [name], MD  Multiple Vitamin (MULTIVITAMIN) tablet Take 1 tablet by mouth daily.   Yes [provider]  nystatin cream (MYCOSTATIN) APPLY TO AFFECTED AREA TWICE A DAY 09/12/17  Yes Dorena Cookey, MD  potassium chloride SA (K-DUR,KLOR-CON) 20 MEQ tablet TAKE 2 TABLETS IN THE MORNING 05/16/17  Yes Dorena Cookey, MD  Tetrahydrozoline HCl (VISINE OP) Place 1 drop into both eyes daily as needed (dry eyes).   Yes [provider]  traMADol (ULTRAM) 50 MG tablet Take 1 tablet (50 mg total) by mouth every 6 (six) hours as needed for moderate pain. 11/07/17  Yes [name], MD  TURMERIC PO Take 1 tablet by mouth daily as needed (FOR LEG CRAMPS).    Yes [provider]  doxycycline (VIBRAMYCIN) 100 MG capsule Take 1 capsule (100 mg total) by mouth 2 (two) times daily. One po bid x 7 days 11/08/17   Charlesetta Shanks, MD  glucose blood (ACCU-CHEK AVIVA PLUS) test strip USE TO TEST DAILY AS INSTRUCTED PER MD**EMERGENCY FILL MEDICAL REASON** 09/27/17   Dorena Cookey, MD  HYDROcodone-acetaminophen (NORCO/VICODIN) 5-325 MG tablet Take 1 tablet by mouth  every 4 (four) hours as needed for moderate pain. Patient not taking: Reported on 11/07/2017 11/06/17   Suzan Slick, NP  Lancets (ACCU-CHEK MULTICLIX) lancets USE TO TEST ONCE DAILY 08/14/17   Dorena Cookey, MD  oxyCODONE-acetaminophen (PERCOCET/ROXICET) 5-325 MG tablet Take 1 tablet by mouth every 4 (four) hours as needed for severe pain. Patient not taking: Reported on 11/07/2017 11/01/17   [name], MD    Family History Family History  Problem Relation Age of Onset  . Stroke Father 58       Died age 3  . Coronary artery disease Brother 35  . Colon cancer Neg Hx   . Colon polyps Neg Hx   . Diabetes Neg Hx   . Esophageal cancer Neg Hx   . Kidney disease Neg Hx   . Gallbladder disease  Neg Hx     Social History Social History   Tobacco Use  . Smoking status: Former Smoker    Years: 20.00    Last attempt to quit: 07/26/1981    Years since quitting: 36.3  . Smokeless tobacco: Never Used  Substance Use Topics  . Alcohol use: Not Currently  . Drug use: No     Allergies   Patient has no known allergies.   Review of Systems Review of Systems 10 Systems reviewed and are negative for acute change except as noted in the HPI.   Physical Exam Updated Vital Signs BP 135/72 (BP Location: Right Arm)   Pulse 76   Temp 98.4 F (36.9 C) (Oral)   Resp 16   SpO2 98%   Physical Exam  Constitutional: He is oriented to person, place, and time. He appears well-developed and well-nourished.  HENT:  Head: Normocephalic and atraumatic.  Eyes: EOM are normal.  Neck: Neck supple.  Cardiovascular: Normal rate, regular rhythm, normal heart sounds and intact distal pulses.  Pulmonary/Chest: Effort normal and breath sounds normal.  Abdominal: Soft. Bowel sounds are normal. He exhibits no distension. There is no tenderness.  Musculoskeletal:  Erythema and large bullae of right stump.  See attached images.  Neurological: He is alert and oriented to person, place, and time. He has  normal strength. He exhibits normal muscle tone. Coordination normal. GCS eye subscore is 4. GCS verbal subscore is 5. GCS motor subscore is 6.  Skin: Skin is warm, dry and intact.  Psychiatric: He has a normal mood and affect.             ED Treatments / Results  Labs (all labs ordered are listed, but only abnormal results are displayed) Labs Reviewed  COMPREHENSIVE METABOLIC PANEL - Abnormal; Notable for the following components:      Result Value   Glucose, Bld 195 (*)    Calcium 8.7 (*)    ALT 12 (*)    All other components within normal limits  CBC WITH DIFFERENTIAL/PLATELET - Abnormal; Notable for the following components:   Hemoglobin 12.4 (*)    HCT 38.5 (*)    All other components within normal limits  I-STAT CG4 LACTIC ACID, ED - Abnormal; Notable for the following components:   Lactic Acid, Venous 2.41 (*)    All other components within normal limits  CULTURE, BLOOD (ROUTINE X 2)  CULTURE, BLOOD (ROUTINE X 2)  I-STAT CG4 LACTIC ACID, ED    EKG None  Radiology No results found.  Procedures Procedures (including critical care time)  Medications Ordered in ED Medications  piperacillin-tazobactam (ZOSYN) IVPB 3.375 g (0 g Intravenous Stopped 11/08/17 1327)  doxycycline (VIBRA-TABS) tablet 100 mg (100 mg Oral Given 11/08/17 1324)     Initial Impression / Assessment and Plan / ED Course  I have reviewed the triage vital signs and the nursing notes.  Pertinent labs & imaging results that were available during my care of the patient were reviewed by me and considered in my medical decision making (see chart for details).  Clinical Course as of Nov 08 1336  Fri Nov 08, 2017  1126 11: 26 reviewed with Dr. Sharol Given.  Will evaluate the patient in the emergency department.  Recommends initiating some antibiotics.   [MP]    Clinical Course User Index [MP] Charlesetta Shanks, MD   Patient is clinically well in appearance.  He does not have fever or leukocytosis.   Lactic acid is mildly elevated.  Has  adamantly refused establishment of IV.  I did subsequently advised him that I talked to Dr. Sharol Given to who recommended initiation of antibiotics.  Patient was quite adamant that he will be leaving here.  He is still not agreeable to getting antibiotic administration.  This all seems to stem from his commitment to leaving the emergency department to go to another appointment.  More patient is encouraged to allow placement of IV and further treatment, the more intransigent he becomes.  Saying "just signed me out."  Patient at this time has agreed to wait for Dr. Sharol Given to to at least see his wound.  Final Clinical Impressions(s) / ED Diagnoses   Final diagnoses:  Wound infection after surgery   Consultation has been done by Dr. Sharol Given.  Patient refuses admission for IV antibiotics as recommended.  Dr. due to advises for 1 dose of IV Zosyn and starting the patient on doxycycline twice daily.  He will see the patient in his office on Monday.  Patient discharged alert and nontoxic.  Clear mental status.  Worsening of chronic stump infection with dehiscence and cellulitis. ED Discharge Orders        Ordered    doxycycline (VIBRAMYCIN) 100 MG capsule  2 times daily     11/08/17 1320       Charlesetta Shanks, MD 11/08/17 1339

## 2017-11-08 NOTE — ED Triage Notes (Signed)
Patient here with swelling and pain/drainage to right stump. Had additional stump surgery due to infection last week and wound vac removed Wednesday. This am the swelling and redness started post dressing change. Alert and oriented

## 2017-11-08 NOTE — Discharge Instructions (Addendum)
1.  See Dr. Sharol Given on Monday for recheck.  Take antibiotics as prescribed.

## 2017-11-09 ENCOUNTER — Other Ambulatory Visit: Payer: Self-pay | Admitting: Family Medicine

## 2017-11-09 LAB — PSA: PSA: 0.6 ng/mL (ref <=4.0)

## 2017-11-11 ENCOUNTER — Encounter (INDEPENDENT_AMBULATORY_CARE_PROVIDER_SITE_OTHER): Payer: Self-pay | Admitting: Orthopedic Surgery

## 2017-11-11 ENCOUNTER — Telehealth (INDEPENDENT_AMBULATORY_CARE_PROVIDER_SITE_OTHER): Payer: Self-pay | Admitting: Orthopedic Surgery

## 2017-11-11 ENCOUNTER — Ambulatory Visit (INDEPENDENT_AMBULATORY_CARE_PROVIDER_SITE_OTHER): Payer: Medicare Other | Admitting: Orthopedic Surgery

## 2017-11-11 DIAGNOSIS — IMO0002 Reserved for concepts with insufficient information to code with codable children: Secondary | ICD-10-CM

## 2017-11-11 DIAGNOSIS — Z89511 Acquired absence of right leg below knee: Secondary | ICD-10-CM

## 2017-11-11 NOTE — Progress Notes (Signed)
Office Visit Note   Patient: [name]           Date of Birth: [birth year]           MRN: [id number] Visit Date: 11/11/2017              Requested by: Dorena Cookey, MD [place], [place] [address] PCP: Dorena Cookey, MD  Chief Complaint  Patient presents with  . Right Leg - Wound Check      HPI: Patient is a 77 year old gentleman status post right transtibial amputation 2 weeks out.  Patient was seen in the emergency room and had blistering and cellulitis.  He received Zosyn antibiotics recommended admission for IV antibiotics patient states that he had to go to some doctor's appointments and was not willing to be admitted.  Patient was started on doxycycline with dressing changes.  Assessment & Plan: Visit Diagnoses:  1. Below knee amputation status, right ([place])     Plan: Continue with the dressing changes recommend he go to [employer] for a 3 extra-large stump shrinker continue the antibiotics.  Follow-Up Instructions: Return in about 1 week (around 11/18/2017).   Ortho Exam  Patient is alert, oriented, no adenopathy, well-dressed, normal affect, normal respiratory effort. Examination the redness and cellulitis has improved.  He does have clear serous drainage there is no purulence no abscess no tenderness to palpation this appears to be more from swelling and infection.  Imaging: No results found. No images are attached to the encounter.  Labs: Lab Results  Component Value Date   HGBA1C 5.9 (H) 10/30/2017   HGBA1C 5.6 05/03/2017   HGBA1C 5.9 09/10/2016   ESRSEDRATE 25 (H) 12/10/2015   ESRSEDRATE 31 (H) 11/10/2013   CRP 0.8 12/10/2015   CRP 0.6 (H) 11/10/2013   REPTSTATUS PENDING 11/08/2017   CULT  11/08/2017    NO GROWTH 2 DAYS Performed at [facility], [place] [address], [place], [place] [address]    LABORGA MORGANELLA MORGANII (A) 12/10/2015     Lab Results  Component Value Date   ALBUMIN 3.6 11/08/2017   ALBUMIN 4.1  09/10/2016   ALBUMIN 4.3 02/29/2016    There is no height or weight on file to calculate BMI.  Orders:  No orders of the defined types were placed in this encounter.  No orders of the defined types were placed in this encounter.    Procedures: No procedures performed  Clinical Data: No additional findings.  ROS:  All other systems negative, except as noted in the HPI. Review of Systems  Objective: Vital Signs: There were no vitals taken for this visit.  Specialty Comments:  No specialty comments available.  PMFS History: Patient Active Problem List   Diagnosis Date Noted  . Below knee amputation status, right ([place]) 10/30/2017  . Dehiscence of amputation stump ([place])   . Nonischemic cardiomyopathy ([place]) 09/03/2017  . AVNRT (AV nodal re-entry tachycardia) ([place]) 01/30/2017  . Chronic midline low back pain without sciatica 01/03/2017  . Diabetic polyneuropathy associated with type 2 diabetes mellitus ([place]) 05/31/2016  . H/O amputation of leg through tibia and fibula, right ([place]) 05/03/2016  . Phantom limb pain ([place]) 03/06/2016  . Heat rash 03/06/2016  . SVT (supraventricular tachycardia) ([place]) 01/20/2016  . Chronic systolic heart failure ([place]) 01/20/2016  . Sepsis ([place]) 12/10/2015  . Diabetic foot ulcers ([place]) 07/12/2015  . Dysphagia 08/06/2014  . Anxiety state 10/26/2013  . PVC (premature ventricular contraction) 07/27/2011  . Uncontrolled  type 2 diabetes mellitus with foot ulcer, without long-term current use of insulin ([place]) 11/02/2010  . BASAL CELL CARCINOMA, FACE 10/18/2008  . BASAL CELL CARCINOMA, FACE 10/18/2008  . LIPOMA, SKIN 05/31/2008  . DRY SKIN 05/31/2008  . ERECTILE DYSFUNCTION 09/30/2007  . OSTEOARTHRITIS 08/08/2007  . Essential hypertension 03/24/2007   Past Medical History:  Diagnosis Date  . CAD (coronary artery disease)    Minimal plaque cath 2008  . Cancer ([place])    skin cancer - basal  . Cataract    both  . Complication of anesthesia     . Diabetes mellitus    Type II  . ED (erectile dysfunction)   . History of kidney stones    x1  . Hypertension   . Osteoarthritis   . PONV (postoperative nausea and vomiting) 1960   with Ether  . PVC's (premature ventricular contractions)   . Rheumatic fever   . Rhinitis     Family History  Problem Relation Age of Onset  . Stroke Father 15       Died age 101  . Coronary artery disease Brother 61  . Colon cancer Neg Hx   . Colon polyps Neg Hx   . Diabetes Neg Hx   . Esophageal cancer Neg Hx   . Kidney disease Neg Hx   . Gallbladder disease Neg Hx     Past Surgical History:  Procedure Laterality Date  . AMPUTATION Right 02/29/2016   Procedure: Right Leg AMPUTATION BELOW KNEE with Wound Vac placement;  Surgeon: [name], MD;  Location: [facility];  Service: Orthopedics;  Laterality: Right;  . CHOLECYSTECTOMY  1983  . colonoscopy  2006, 2010  . EYE SURGERY Bilateral    cataract  . INGUINAL HERNIA REPAIR Right 1960  . STUMP REVISION Right 10/30/2017   Procedure: RIGHT BELOW KNEE AMPUTATION REVISION;  Surgeon: [name], MD;  Location: [facility];  Service: Orthopedics;  Laterality: Right;   Social History   Occupational History  . 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 Smoking status: Former Smoker    Years: 20.00    Last attempt to quit: 07/26/1981    Years since quitting: 36.3  . Smokeless tobacco: Never Used  Substance and Sexual Activity  . Alcohol use: Not Currently  . Drug use: No  . Sexual activity: Never

## 2017-11-11 NOTE — Telephone Encounter (Signed)
[name] called from [facility] [employer] needing an RX for shrinkers sent over to [phone]

## 2017-11-12 ENCOUNTER — Telehealth (INDEPENDENT_AMBULATORY_CARE_PROVIDER_SITE_OTHER): Payer: Self-pay | Admitting: Orthopedic Surgery

## 2017-11-12 ENCOUNTER — Encounter: Payer: Self-pay | Admitting: Internal Medicine

## 2017-11-12 ENCOUNTER — Ambulatory Visit (INDEPENDENT_AMBULATORY_CARE_PROVIDER_SITE_OTHER): Payer: Medicare Other | Admitting: Internal Medicine

## 2017-11-12 ENCOUNTER — Other Ambulatory Visit (INDEPENDENT_AMBULATORY_CARE_PROVIDER_SITE_OTHER): Payer: Self-pay

## 2017-11-12 VITALS — BP 123/69 | HR 75 | Ht 72.0 in | Wt 215.4 lb

## 2017-11-12 DIAGNOSIS — I10 Essential (primary) hypertension: Secondary | ICD-10-CM | POA: Diagnosis not present

## 2017-11-12 DIAGNOSIS — R351 Nocturia: Secondary | ICD-10-CM

## 2017-11-12 DIAGNOSIS — N401 Enlarged prostate with lower urinary tract symptoms: Secondary | ICD-10-CM | POA: Insufficient documentation

## 2017-11-12 DIAGNOSIS — I428 Other cardiomyopathies: Secondary | ICD-10-CM | POA: Diagnosis not present

## 2017-11-12 MED ORDER — LISINOPRIL 10 MG PO TABS: 10.0000 mg | ORAL_TABLET | Freq: Every day | ORAL | 3 refills | Status: DC

## 2017-11-12 MED ORDER — TAMSULOSIN HCL 0.4 MG PO CAPS: 0.4000 mg | ORAL_CAPSULE | Freq: Two times a day (BID) | ORAL | 0 refills | Status: DC

## 2017-11-12 NOTE — Patient Instructions (Addendum)
Your physician has recommended you make the following change in your medication:  -- DECREASE aspirin to 81mg  daily -- INCREASE tamsulosin (flomax) to twice daily -- INCREASE lisinopril to 10mg    Your physician wants you to follow-up in: 6 months with Dr. Debara Pickett. You will receive a reminder letter in the mail two months in advance. If you don't receive a letter, please call our office to schedule the follow-up appointment.

## 2017-11-12 NOTE — Telephone Encounter (Signed)
I called and advised ok to d/c PT but patient can have HHN to eval

## 2017-11-12 NOTE — Progress Notes (Signed)
OFFICE NOTE  Chief Complaint:  Frequent urination  Primary Care Physician: Tyrone Cookey, MD  HPI:  [name] is a 77 y.o. male he was previously seen by Dr. Percival Roberts last in 2013. He was followed for PVCs at that time and underwent stress testing. There is no evidence for ischemia and no further workup was recommended. Recently was hospitalized for sepsis and was noted to have intermittent SVTs as well as PVCs on telemetry. Although he was not seen by cardiology as an inpatient, we were consult it for outpatient monitor. He was placed on a monitor which I reviewed and is still in progress. The first 24 days of the monitor show frequent PVCs as well as intermittent SVT. During this past hospitalization he had an echocardiogram which shows a newly reduced LVEF of 45-50%, with basal inferior akinesis and inferolateral hypokinesis and grade 2 diastolic dysfunction. This compares to a normal LVF and 2013 by echo. He did have a remote left heart catheterization in 2008 which showed minimal coronary artery disease. He denies any chest pain or worsening shortness of breath. He is having trouble ambulating due to problems with his right ankle. He also reports being under significant stress dealing with his wife who has Parkinson's. He is in the process of moving to friends [place].  01/30/2017  [name] returns today for follow-up. He has had a difficult year. His wife was diagnosed with Parkinson's and ultimately died of congestive heart failure. He developed a diabetic wound and then had the transtibial amputation of the right leg. He was supposed going to friends home but ultimately went to [facility] for rehabilitation and is currently back at home. He does have a leg prostheses. He continues to have SVT in fact has a short RP tachycardia which was caught today in the office. He says is asymptomatic with this. EF is shown to be 45-50% in the past. He did undergo nuclear stress test which was  negative for ischemia prior to his amputation. He denies any chest pain.  09/03/2017  [name] returns today for follow-up.  He has no new complaints today.  He has been struggling with some back pain.  He feels like his right leg prosthesis may be a little short.  He denies any recurrent SVT.  He was placed on the carvedilol for cardiomyopathy with EF 45-50% in 2017 and a blood pressures been well controlled on that.  He denies any chest pain.  He reports occasional shortness of breath sometimes with exertion but also at rest.  10/02/2017  [name] was seen today in follow-up.  He continues to get some care from the [place].  He did report some problems with his right leg prosthesis.  He has not been able to exercise since then.  He gets mild shortness of breath with exertion, consistent with NYHA class II symptoms.  Recent echo was performed to see if he had any improvement in LV function, unfortunately however he had a decline in LVEF to 40 to 45%.  There is additionally some mild to moderate RV dysfunction.  He is only on carvedilol.  I think he would benefit from the addition of Entresto.  Labs last year indicated normal renal function.  11/12/2017  [name] returns today for follow-up.  His main concerns are frequent urination both throughout the day and at night.  He was started on tamsulosin by his PCP which he takes 0.4 mg in the morning.  He does not  note any significant improvement.  I had started him on Entresto however due to frequent urination he discontinued the medication.  He was then switched to lisinopril.  He seems to be tolerating this.  LVEF had declined recently to 40 to 45%.  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  PMHx:  Past Medical History:  Diagnosis Date  . CAD (coronary artery disease)    Minimal plaque cath 2008  . Cancer ([place])    skin cancer - basal  . Cataract    both  . Complication of anesthesia     . Diabetes mellitus    Type II  . ED (erectile dysfunction)   . History of kidney stones    x1  . Hypertension   . Osteoarthritis   . PONV (postoperative nausea and vomiting) 1960   with Ether  . PVC's (premature ventricular contractions)   . Rheumatic fever   . Rhinitis     Past Surgical History:  Procedure Laterality Date  . AMPUTATION Right 02/29/2016   Procedure: Right Leg AMPUTATION BELOW KNEE with Wound Vac placement;  Surgeon: Tyrone Minion, MD;  Location: [facility];  Service: Orthopedics;  Laterality: Right;  . CHOLECYSTECTOMY  1983  . colonoscopy  2006, 2010  . EYE SURGERY Bilateral    cataract  . INGUINAL HERNIA REPAIR Right 1960  . STUMP REVISION Right 10/30/2017   Procedure: RIGHT BELOW KNEE AMPUTATION REVISION;  Surgeon: Tyrone Minion, MD;  Location: [facility];  Service: Orthopedics;  Laterality: Right;    FAMHx:  Family History  Problem Relation Age of Onset  . Stroke Father 50       Died age 71  . Coronary artery disease Brother 13  . Colon cancer Neg Hx   . Colon polyps Neg Hx   . Diabetes Neg Hx   . Esophageal cancer Neg Hx   . Kidney disease Neg Hx   . Gallbladder disease Neg Hx     SOCHx:   reports that he quit smoking about 36 years ago. He quit after 20.00 years of use. He has never used smokeless tobacco. He reports that he drank alcohol. He reports that he does not use drugs.  ALLERGIES:  No Known Allergies  ROS: Pertinent items noted in HPI and remainder of comprehensive ROS otherwise negative.  HOME MEDS: Current Outpatient Medications  Medication Sig Dispense Refill  . aspirin EC 325 MG tablet Take 325 mg by mouth daily. May take an additional 325 or 650 mg twice daily as needed for pain / headaches    . aspirin-sod bicarb-citric acid (ALKA-SELTZER) 325 MG TBEF tablet Take 650 mg by mouth daily as needed (heart burn).    . carvedilol (COREG) 12.5 MG tablet TAKE 1 TABLET (12.5 MG TOTAL) BY MOUTH 2 (TWO) TIMES DAILY. 60 tablet 5  . doxycycline  (VIBRAMYCIN) 100 MG capsule Take 1 capsule (100 mg total) by mouth 2 (two) times daily. One po bid x 7 days 14 capsule 0  . gabapentin (NEURONTIN) 100 MG capsule Take 1 capsule (100 mg total) by mouth 3 (three) times daily. (Patient taking differently: Take 100 mg by mouth 3 (three) times daily as needed (pain). ) 270 capsule 3  . glucose blood (ACCU-CHEK AVIVA PLUS) test strip USE TO TEST DAILY AS INSTRUCTED PER MD**EMERGENCY FILL MEDICAL REASON** 100 each 0  . halobetasol (ULTRAVATE) 0.05 % cream APPLY TO AFFECTED AREA TWICE A DAY  50 g 2  . hydrocortisone cream 1 % Apply 1 application topically daily as needed for itching.    . Lancets (ACCU-CHEK MULTICLIX) lancets USE TO TEST ONCE DAILY 102 each 2  . lisinopril (PRINIVIL,ZESTRIL) 5 MG tablet Take 1 tablet (5 mg total) by mouth daily. 90 tablet 3  . loratadine (CLARITIN) 10 MG tablet Take 10 mg by mouth daily.     Marland Kitchen LORazepam (ATIVAN) 0.5 MG tablet Take 1 tablet (0.5 mg total) by mouth every 8 (eight) hours as needed for anxiety. 15 tablet 0  . metFORMIN (GLUCOPHAGE) 1000 MG tablet TAKE 1 TABLET (1,000 MG TOTAL) BY MOUTH 2 (TWO) TIMES DAILY. 180 tablet 2  . methocarbamol (ROBAXIN) 500 MG tablet TAKE 1 TABLET EVERY 6 HOURS AS NEEDED MUSCLE SPASMS 30 tablet 0  . Multiple Vitamin (MULTIVITAMIN) tablet Take 1 tablet by mouth daily.    Marland Kitchen nystatin cream (MYCOSTATIN) APPLY TO AFFECTED AREA TWICE A DAY 30 g 2  . potassium chloride SA (K-DUR,KLOR-CON) 20 MEQ tablet TAKE 2 TABLETS IN THE MORNING 180 tablet 3  . potassium chloride SA (KLOR-CON M20) 20 MEQ tablet TAKE 2 TABLETS IN THE MORNING 180 tablet 3  . tamsulosin (FLOMAX) 0.4 MG CAPS capsule Take 1 capsule (0.4 mg total) by mouth daily. 30 capsule 3  . Tetrahydrozoline HCl (VISINE OP) Place 1 drop into both eyes daily as needed (dry eyes).    . traMADol (ULTRAM) 50 MG tablet Take 1 tablet (50 mg total) by mouth every 6 (six) hours as needed for moderate pain. 60 tablet 0  . TURMERIC PO Take 1 tablet by  mouth daily as needed (FOR LEG CRAMPS).      No current facility-administered medications for this visit.     LABS/IMAGING: No results found for this or any previous visit (from the past 48 hour(s)). No results found.  WEIGHTS: Wt Readings from Last 3 Encounters:  11/12/17 215 lb 6.4 oz (97.7 kg)  11/08/17 215 lb (97.5 kg)  11/07/17 216 lb (98 kg)    VITALS: BP 123/69   Pulse 75   Ht 6' (1.829 m)   Wt 215 lb 6.4 oz (97.7 kg)   BMI 29.21 kg/m   EXAM: General appearance: alert, no distress and mildly obese Lungs: clear to auscultation bilaterally Heart: regular rate and rhythm, S1, S2 normal, no murmur, click, rub or gallop Abdomen: soft, non-tender; bowel sounds normal; no masses,  no organomegaly Extremities: Trace left lower extremity edema, status post right BKA Psych: Mildly anxious  EKG: Deferred  ASSESSMENT: 1. Worsening cardiomyopathy with EF 40-45% (08/2017), NYHA Class II symptoms 2. Recurrent short RP tachycardia  3. Hypertension 4. Type 2 diabetes 5. Right BKA  PLAN: 1.   Mr. Strine has worsening cardiomyopathy with NYHA class II symptoms.  He was started on Entresto, but had excessive urination and was discontinued.  He is now on lisinopril 5 mg.  Blood pressure is been stable and I think will tolerate an increase to 10 mg.  He was started on tamsulosin for frequent urination and I will increase that to 0.4 mg twice daily.  Plan follow-up in 6 months.  Pixie Casino, MD, [facility], [place] Director of the Advanced Lipid Disorders &  Cardiovascular Risk Reduction Clinic Diplomate of the American Board of Clinical Lipidology Attending Cardiologist  Direct Dial: [phone]  Fax: [phone]  Website:  [email or web address]  Nadean Corwin Stevey Stapleton 11/12/2017, 10:22 AM

## 2017-11-12 NOTE — Telephone Encounter (Signed)
Order faxed.

## 2017-11-12 NOTE — Telephone Encounter (Signed)
Clair Gulling from [facility] called stating that the patient had 1 visit of home health PT and declined any further visits, but did agree to nursing. So Clair Gulling wanted to know if Dr. Sharol Given wanted to discharge HHPT completely or send over orders. CB # [phone]

## 2017-11-13 ENCOUNTER — Encounter: Payer: Self-pay | Admitting: Family Medicine

## 2017-11-13 LAB — CULTURE, BLOOD (ROUTINE X 2)
Culture: NO GROWTH
Special Requests: ADEQUATE

## 2017-11-14 ENCOUNTER — Telehealth (INDEPENDENT_AMBULATORY_CARE_PROVIDER_SITE_OTHER): Payer: Self-pay | Admitting: Orthopedic Surgery

## 2017-11-14 NOTE — Telephone Encounter (Signed)
Do you want to extend ABX? See message below.

## 2017-11-14 NOTE — Telephone Encounter (Signed)
Patients daughter called and would like to know if they can get the antibiotic extended until patient comes into the office next Wednesday? She said it is supposed to run out on Friday but it doesn't seem to be healing as quickly as expected and she doesn't want it to get worse since it will run out before he is seen again. Please advise Hinton Dyer # [phone]

## 2017-11-15 ENCOUNTER — Other Ambulatory Visit (INDEPENDENT_AMBULATORY_CARE_PROVIDER_SITE_OTHER): Payer: Self-pay

## 2017-11-15 MED ORDER — DOXYCYCLINE HYCLATE 100 MG PO CAPS: 100.0000 mg | ORAL_CAPSULE | Freq: Two times a day (BID) | ORAL | 0 refills | Status: DC

## 2017-11-15 NOTE — Telephone Encounter (Signed)
Patients daughter called again this morning wanting to know the status of her fathers RX. CB # [phone]

## 2017-11-15 NOTE — Telephone Encounter (Signed)
Ok per Dr. Sharol Given to refill this was called into pt's pharm and daughter notified.

## 2017-11-20 ENCOUNTER — Encounter (INDEPENDENT_AMBULATORY_CARE_PROVIDER_SITE_OTHER): Payer: Self-pay | Admitting: Family

## 2017-11-20 ENCOUNTER — Ambulatory Visit (INDEPENDENT_AMBULATORY_CARE_PROVIDER_SITE_OTHER): Payer: Medicare Other | Admitting: Family

## 2017-11-20 VITALS — Ht 72.0 in | Wt 215.0 lb

## 2017-11-20 DIAGNOSIS — Z89511 Acquired absence of right leg below knee: Secondary | ICD-10-CM

## 2017-11-20 DIAGNOSIS — IMO0002 Reserved for concepts with insufficient information to code with codable children: Secondary | ICD-10-CM

## 2017-11-20 NOTE — Progress Notes (Signed)
Office Visit Note   Patient: [name]           Date of Birth: [birth year]           MRN: [id number] Visit Date: 11/20/2017              Requested by: Dorena Cookey, MD [place], [place] [address] PCP: Dorena Cookey, MD  Chief Complaint  Patient presents with  . Right Leg - Routine Post Op    10/30/17 right below the knee revision       HPI: Patient is a 77 year old gentleman status post right transtibial amputation revision on 10/30/17.  Assessment & Plan: Visit Diagnoses:  1. Below knee amputation status, right ([place])     Plan: sutures harvested. Dry dressing applied. Given order for prosthesis set up at [facility]. Continue with the dressing changes.  Follow-Up Instructions: Return in about 6 weeks (around 01/01/2018).   Ortho Exam  Patient is alert, oriented, no adenopathy, well-dressed, normal affect, normal respiratory effort. Examination the redness and cellulitis has improved. no drainage there is no purulence no abscess no tenderness to palpation.incision well healed.   Imaging: No results found. No images are attached to the encounter.  Labs: Lab Results  Component Value Date   HGBA1C 5.9 (H) 10/30/2017   HGBA1C 5.6 05/03/2017   HGBA1C 5.9 09/10/2016   ESRSEDRATE 25 (H) 12/10/2015   ESRSEDRATE 31 (H) 11/10/2013   CRP 0.8 12/10/2015   CRP 0.6 (H) 11/10/2013   REPTSTATUS 11/13/2017 FINAL 11/08/2017   CULT NO GROWTH 5 DAYS 11/08/2017   LABORGA MORGANELLA MORGANII (A) 12/10/2015     Lab Results  Component Value Date   ALBUMIN 3.6 11/08/2017   ALBUMIN 4.1 09/10/2016   ALBUMIN 4.3 02/29/2016    Body mass index is 29.16 kg/m.  Orders:  No orders of the defined types were placed in this encounter.  No orders of the defined types were placed in this encounter.    Procedures: No procedures performed  Clinical Data: No additional findings.  ROS:  All other systems negative, except as noted in the HPI. Review of Systems    Constitutional: Negative for chills and fever.    Objective: Vital Signs: Ht 6' (1.829 m)   Wt 215 lb (97.5 kg)   BMI 29.16 kg/m   Specialty Comments:  No specialty comments available.  PMFS History: Patient Active Problem List   Diagnosis Date Noted  . Benign prostatic hyperplasia with nocturia 11/12/2017  . Below knee amputation status, right ([place]) 10/30/2017  . Dehiscence of amputation stump ([place])   . Nonischemic cardiomyopathy ([place]) 09/03/2017  . AVNRT (AV nodal re-entry tachycardia) ([place]) 01/30/2017  . Chronic midline low back pain without sciatica 01/03/2017  . Diabetic polyneuropathy associated with type 2 diabetes mellitus ([place]) 05/31/2016  . H/O amputation of leg through tibia and fibula, right ([place]) 05/03/2016  . Phantom limb pain ([place]) 03/06/2016  . Heat rash 03/06/2016  . SVT (supraventricular tachycardia) ([place]) 01/20/2016  . Chronic systolic heart failure ([place]) 01/20/2016  . Sepsis ([place]) 12/10/2015  . Diabetic foot ulcers ([place]) 07/12/2015  . Dysphagia 08/06/2014  . Anxiety state 10/26/2013  . PVC (premature ventricular contraction) 07/27/2011  . Uncontrolled type 2 diabetes mellitus with foot ulcer, without long-term current use of insulin ([place]) 11/02/2010  . BASAL CELL CARCINOMA, FACE 10/18/2008  . BASAL CELL CARCINOMA, FACE 10/18/2008  . LIPOMA, SKIN 05/31/2008  . DRY SKIN 05/31/2008  . ERECTILE DYSFUNCTION 09/30/2007  .  OSTEOARTHRITIS 08/08/2007  . Essential hypertension 03/24/2007   Past Medical History:  Diagnosis Date  . CAD (coronary artery disease)    Minimal plaque cath 2008  . Cancer ([place])    skin cancer - basal  . Cataract    both  . Complication of anesthesia   . Diabetes mellitus    Type II  . ED (erectile dysfunction)   . History of kidney stones    x1  . Hypertension   . Osteoarthritis   . PONV (postoperative nausea and vomiting) 1960   with Ether  . PVC's (premature ventricular contractions)   . Rheumatic fever   . Rhinitis      Family History  Problem Relation Age of Onset  . Stroke Father 13       Died age 10  . Coronary artery disease Brother 110  . Colon cancer Neg Hx   . Colon polyps Neg Hx   . Diabetes Neg Hx   . Esophageal cancer Neg Hx   . Kidney disease Neg Hx   . Gallbladder disease Neg Hx     Past Surgical History:  Procedure Laterality Date  . AMPUTATION Right 02/29/2016   Procedure: Right Leg AMPUTATION BELOW KNEE with Wound Vac placement;  Surgeon: [name], MD;  Location: [facility];  Service: Orthopedics;  Laterality: Right;  . CHOLECYSTECTOMY  1983  . colonoscopy  2006, 2010  . EYE SURGERY Bilateral    cataract  . INGUINAL HERNIA REPAIR Right 1960  . STUMP REVISION Right 10/30/2017   Procedure: RIGHT BELOW KNEE AMPUTATION REVISION;  Surgeon: [name], MD;  Location: [facility];  Service: Orthopedics;  Laterality: Right;   Social History   Occupational History  . 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 Smoking status: Former Smoker    Years: 20.00    Last attempt to quit: 07/26/1981    Years since quitting: 36.3  . Smokeless tobacco: Never Used  Substance and Sexual Activity  . Alcohol use: Not Currently  . Drug use: No  . Sexual activity: Never

## 2017-11-21 ENCOUNTER — Ambulatory Visit (INDEPENDENT_AMBULATORY_CARE_PROVIDER_SITE_OTHER): Payer: Medicare Other | Admitting: Orthopedic Surgery

## 2017-11-21 ENCOUNTER — Encounter (INDEPENDENT_AMBULATORY_CARE_PROVIDER_SITE_OTHER): Payer: Self-pay

## 2017-11-22 ENCOUNTER — Ambulatory Visit (INDEPENDENT_AMBULATORY_CARE_PROVIDER_SITE_OTHER): Payer: Medicare Other | Admitting: Orthopedic Surgery

## 2017-12-13 ENCOUNTER — Telehealth: Payer: Self-pay | Admitting: Family Medicine

## 2017-12-13 NOTE — Telephone Encounter (Signed)
Copied from [place] [id number]. Topic: Quick Communication - Rx Refill/Question >> Dec 13, 2017  1:33 PM Burchel, Abbi R wrote: Medication: Lancets (ACCU-CHEK MULTICLIX) lancets   Preferred Pharmacy: [facility] [place], [place]  [phone] (Phone) [phone] (Fax)  Pt: [phone]  Pt states he was told by pharmacy that they do not have/make these lancets anymore.  Please advise.

## 2017-12-16 ENCOUNTER — Other Ambulatory Visit: Payer: Self-pay

## 2017-12-16 MED ORDER — ACCU-CHEK MULTICLIX LANCETS MISC: 2 refills | Status: DC

## 2017-12-16 MED ORDER — GLUCOSE BLOOD VI STRP: ORAL_STRIP | 0 refills | Status: DC

## 2017-12-16 NOTE — Telephone Encounter (Signed)
Request for lancets to used with the Accu check Aviva Plus test strips. Per pharmacy, the patient can not get the accu-chek multiclix any more.  LOV  11/08/17 with C. [facility]  Dr. Sherren Mocha.

## 2017-12-16 NOTE — Telephone Encounter (Signed)
Rx was sent to pharmacy per pt request.  

## 2018-01-01 ENCOUNTER — Ambulatory Visit (INDEPENDENT_AMBULATORY_CARE_PROVIDER_SITE_OTHER): Payer: Medicare Other | Admitting: Family

## 2018-01-01 ENCOUNTER — Ambulatory Visit (INDEPENDENT_AMBULATORY_CARE_PROVIDER_SITE_OTHER): Payer: Medicare Other | Admitting: Orthopedic Surgery

## 2018-01-03 ENCOUNTER — Telehealth: Payer: Self-pay | Admitting: Internal Medicine

## 2018-01-03 NOTE — Telephone Encounter (Signed)
New Message         Pt c/o medication issue:  1. Name of Medication: Lisinopril   2. How are you currently taking this medication (dosage and times per day)? Once  3. Are you having a reaction (difficulty breathing--STAT)? No   4. What is your medication issue? Medication has being changed. Patient was not aware would like a call back.

## 2018-01-03 NOTE — Telephone Encounter (Signed)
Returned call to patient of Dr. Debara Pickett who states someone changed his lisinopril and didn't tell him about it. He states it was increased to 10mg . Explained that this med change was made at last MD [facility] on 6/18 and was on his instruction sheet and the Rx was sent to the pharmacy. He states "no wonder I feel bad" and states he has been coughing since on being on increased dose of lisinopril.   Will route to MD for recommendations

## 2018-01-07 NOTE — Telephone Encounter (Signed)
Patient returned call. He was notified of MD med change suggestion but does not wish to change his medication at this time. Advised that if his cough becomes more bothersome, please let us know.

## 2018-01-07 NOTE — Telephone Encounter (Signed)
LMTCB

## 2018-01-07 NOTE — Telephone Encounter (Signed)
Can switch lisinopril to losartan 50 mg daily to see if cough improves.  Dr. Lemmie Evens

## 2018-01-07 NOTE — Telephone Encounter (Signed)
Patient returned call. Operator attempted transfer but it was unsuccessful. Attempted to call patient and LMTCB

## 2018-01-10 ENCOUNTER — Other Ambulatory Visit: Payer: Self-pay | Admitting: Internal Medicine

## 2018-01-20 DIAGNOSIS — L821 Other seborrheic keratosis: Secondary | ICD-10-CM | POA: Diagnosis not present

## 2018-01-20 DIAGNOSIS — Z85828 Personal history of other malignant neoplasm of skin: Secondary | ICD-10-CM | POA: Diagnosis not present

## 2018-01-20 DIAGNOSIS — L570 Actinic keratosis: Secondary | ICD-10-CM | POA: Diagnosis not present

## 2018-02-24 ENCOUNTER — Encounter (INDEPENDENT_AMBULATORY_CARE_PROVIDER_SITE_OTHER): Payer: Self-pay | Admitting: Orthopedic Surgery

## 2018-02-24 ENCOUNTER — Ambulatory Visit (INDEPENDENT_AMBULATORY_CARE_PROVIDER_SITE_OTHER): Payer: Medicare Other | Admitting: Orthopedic Surgery

## 2018-02-24 DIAGNOSIS — IMO0002 Reserved for concepts with insufficient information to code with codable children: Secondary | ICD-10-CM

## 2018-02-24 DIAGNOSIS — Z89511 Acquired absence of right leg below knee: Secondary | ICD-10-CM

## 2018-02-24 NOTE — Progress Notes (Signed)
Office Visit Note   Patient: [name]           Date of Birth: 12/30/1940           MRN: [id number] Visit Date: 02/24/2018              Requested by: Dorena Cookey, MD [place], [place] [address] PCP: Dorena Cookey, MD  Chief Complaint  Patient presents with  . Right Knee - Pain, Follow-up      HPI: Patient is a 77 year old gentleman who presents with increasing pain with N bearing on his residual limb on the right side.  Patient is status post revision of a transtibial amputation approximately 4 months ago.  Patient states he used to be although to ambulate well with a cane but now he can only use a walker due to inability to place weight on the end of the residual limb.  Patient has had multiple modifications to his socket with padding without resolution he states he does not have rotational stability and has pain with weightbearing.  Patient states that he has been to the [facility] in [place] and they recommended a new socket.  Patient states that he would like to consider a new orthotist.  Assessment & Plan: Visit Diagnoses:  1. Below knee amputation status, right     Plan: As per patient's request he was given a prescription to go to [facility] to see if he can get a new socket.  With patient's N bearing and skin changes over the residual limb patient is at risk of skin breakdown risk for additional surgery.  Patient is unsteady and unable to ambulate as a community ambulator due to the pain from weightbearing.  Follow-Up Instructions: Return if symptoms worsen or fail to improve.   Ortho Exam  Patient is alert, oriented, no adenopathy, well-dressed, normal affect, normal respiratory effort. Examination patient ambulates with a walker he used to ambulate with a cane.  He has no rotational stability with the residual limb.  There is padding in the socket but this is not providing him any support.  There is in bearing skin changes with redness patient is at  risk of skin breakdown.  There is also redness around the patella from subsidence in his socket.  Imaging: No results found. No images are attached to the encounter.  Labs: Lab Results  Component Value Date   HGBA1C 5.9 (H) 10/30/2017   HGBA1C 5.6 05/03/2017   HGBA1C 5.9 09/10/2016   ESRSEDRATE 25 (H) 12/10/2015   ESRSEDRATE 31 (H) 11/10/2013   CRP 0.8 12/10/2015   CRP 0.6 (H) 11/10/2013   REPTSTATUS 11/13/2017 FINAL 11/08/2017   CULT NO GROWTH 5 DAYS 11/08/2017   LABORGA MORGANELLA MORGANII (A) 12/10/2015     Lab Results  Component Value Date   ALBUMIN 3.6 11/08/2017   ALBUMIN 4.1 09/10/2016   ALBUMIN 4.3 02/29/2016    There is no height or weight on file to calculate BMI.  Orders:  No orders of the defined types were placed in this encounter.  No orders of the defined types were placed in this encounter.    Procedures: No procedures performed  Clinical Data: No additional findings.  ROS:  All other systems negative, except as noted in the HPI. Review of Systems  Objective: Vital Signs: There were no vitals taken for this visit.  Specialty Comments:  No specialty comments available.  PMFS History: Patient Active Problem List   Diagnosis Date Noted  .  Benign prostatic hyperplasia with nocturia 11/12/2017  . Below knee amputation status, right ([place]) 10/30/2017  . Dehiscence of amputation stump ([place])   . Nonischemic cardiomyopathy ([place]) 09/03/2017  . AVNRT (AV nodal re-entry tachycardia) ([place]) 01/30/2017  . Chronic midline low back pain without sciatica 01/03/2017  . Diabetic polyneuropathy associated with type 2 diabetes mellitus ([place]) 05/31/2016  . H/O amputation of leg through tibia and fibula, right ([place]) 05/03/2016  . Phantom limb pain ([place]) 03/06/2016  . Heat rash 03/06/2016  . SVT (supraventricular tachycardia) ([place]) 01/20/2016  . Chronic systolic heart failure ([place]) 01/20/2016  . Sepsis ([place]) 12/10/2015  . Diabetic foot ulcers ([place])  07/12/2015  . Dysphagia 08/06/2014  . Anxiety state 10/26/2013  . PVC (premature ventricular contraction) 07/27/2011  . Uncontrolled type 2 diabetes mellitus with foot ulcer, without long-term current use of insulin ([place]) 11/02/2010  . BASAL CELL CARCINOMA, FACE 10/18/2008  . BASAL CELL CARCINOMA, FACE 10/18/2008  . LIPOMA, SKIN 05/31/2008  . DRY SKIN 05/31/2008  . ERECTILE DYSFUNCTION 09/30/2007  . OSTEOARTHRITIS 08/08/2007  . Essential hypertension 03/24/2007   Past Medical History:  Diagnosis Date  . CAD (coronary artery disease)    Minimal plaque cath 2008  . Cancer ([place])    skin cancer - basal  . Cataract    both  . Complication of anesthesia   . Diabetes mellitus    Type II  . ED (erectile dysfunction)   . History of kidney stones    x1  . Hypertension   . Osteoarthritis   . PONV (postoperative nausea and vomiting) 1960   with Ether  . PVC's (premature ventricular contractions)   . Rheumatic fever   . Rhinitis     Family History  Problem Relation Age of Onset  . Stroke Father 63       Died age 74  . Coronary artery disease Brother 52  . Colon cancer Neg Hx   . Colon polyps Neg Hx   . Diabetes Neg Hx   . Esophageal cancer Neg Hx   . Kidney disease Neg Hx   . Gallbladder disease Neg Hx     Past Surgical History:  Procedure Laterality Date  . AMPUTATION Right 02/29/2016   Procedure: Right Leg AMPUTATION BELOW KNEE with Wound Vac placement;  Surgeon: [name], MD;  Location: [facility];  Service: Orthopedics;  Laterality: Right;  . CHOLECYSTECTOMY  1983  . colonoscopy  2006, 2010  . EYE SURGERY Bilateral    cataract  . INGUINAL HERNIA REPAIR Right 1960  . STUMP REVISION Right 10/30/2017   Procedure: RIGHT BELOW KNEE AMPUTATION REVISION;  Surgeon: [name], MD;  Location: [facility];  Service: Orthopedics;  Laterality: Right;   Social History   Occupational History  . Occupation: Curator - [employer]    Employer: RETIRED  Tobacco Use    . Smoking status: Former Smoker    Years: 20.00    Last attempt to quit: 07/26/1981    Years since quitting: 36.6  . Smokeless tobacco: Never Used  Substance and Sexual Activity  . Alcohol use: Not Currently  . Drug use: No  . Sexual activity: Never

## 2018-03-04 ENCOUNTER — Telehealth: Payer: Self-pay | Admitting: *Deleted

## 2018-03-04 NOTE — Telephone Encounter (Signed)
Copied from [place] [id number]. Topic: Inquiry >> Mar 04, 2018 11:27 AM Oliver Pila B wrote: Reason for CRM: pt called asking for a Apolonio Schneiders; pt would not disclose purpose of phone call; contact to advise

## 2018-03-06 NOTE — Telephone Encounter (Signed)
Spoke to pt and he stated that he had the issue taking care of by another provider. No further action needed!

## 2018-03-10 ENCOUNTER — Encounter: Payer: Self-pay | Admitting: Family Medicine

## 2018-03-10 ENCOUNTER — Ambulatory Visit (INDEPENDENT_AMBULATORY_CARE_PROVIDER_SITE_OTHER): Payer: Medicare Other | Admitting: Family Medicine

## 2018-03-10 VITALS — BP 122/68 | HR 81 | Temp 98.3°F | Ht 72.0 in | Wt 224.2 lb

## 2018-03-10 DIAGNOSIS — R739 Hyperglycemia, unspecified: Secondary | ICD-10-CM | POA: Diagnosis not present

## 2018-03-10 DIAGNOSIS — R351 Nocturia: Secondary | ICD-10-CM | POA: Diagnosis not present

## 2018-03-10 DIAGNOSIS — I10 Essential (primary) hypertension: Secondary | ICD-10-CM | POA: Diagnosis not present

## 2018-03-10 DIAGNOSIS — L309 Dermatitis, unspecified: Secondary | ICD-10-CM | POA: Diagnosis not present

## 2018-03-10 DIAGNOSIS — N401 Enlarged prostate with lower urinary tract symptoms: Secondary | ICD-10-CM | POA: Diagnosis not present

## 2018-03-10 MED ORDER — HALOBETASOL PROPIONATE 0.05 % EX CREA: TOPICAL_CREAM | CUTANEOUS | 2 refills | Status: DC

## 2018-03-10 MED ORDER — SILODOSIN 8 MG PO CAPS: 8.0000 mg | ORAL_CAPSULE | Freq: Every day | ORAL | 11 refills | Status: DC

## 2018-03-10 NOTE — Assessment & Plan Note (Signed)
At goal.  Continue Coreg 12.5 mg twice daily and lisinopril 10 mg daily.

## 2018-03-10 NOTE — Assessment & Plan Note (Signed)
Continue metformin 1000mg   twice daily.  Follow-up in 6 months to repeat A1c.

## 2018-03-10 NOTE — Assessment & Plan Note (Signed)
Halobetasol refilled.

## 2018-03-10 NOTE — Assessment & Plan Note (Signed)
Stop Flomax and start Rapaflo 8 mg daily.  Consider urological referral if symptoms persist.

## 2018-03-10 NOTE — Patient Instructions (Addendum)
It was very nice to see you today!  Please start the rapaflo. Take 1 pill once daily. Stop the flomax.   Come back to see me in 6-12 months, or sooner as needed.   Take care, Dr Jerline Pain

## 2018-03-10 NOTE — Progress Notes (Signed)
   Subjective:  [name] is a 77 y.o. male who presents today with a chief complaint of BPH and to transfer care.  HPI:  BPH, chronic problem, stable Patient was started on Flomax several months ago.  He is currently on 0.4 mg twice daily.  He has not noticed significant change in his symptoms.  Still has frequent urination during the day.  Wakes up 3-4 times at night.  No abdominal pain.  No noted hematuria.  Hypertension, chronic problem, stable Currently on Coreg 12.5 mg twice daily and lisinopril 10 mg daily.  Tolerating well.  Hyperglycemia Has been diagnosed with diabetes in the past.  A1c has been well controlled for the past several years.  Currently on metformin 1000 mg twice daily.  Dermatitis Several year history. Uses halobetasol as needed.   ROS: Per HPI  PMH: He reports that he quit smoking about 36 years ago. He quit after 20.00 years of use. He has never used smokeless tobacco. He reports that he drank alcohol. He reports that he does not use drugs.  Objective:  Physical Exam: BP 122/68 (BP Location: Left Arm, Patient Position: Sitting, Cuff Size: Normal)   Pulse 81   Temp 98.3 F (36.8 C) (Oral)   Ht 6' (1.829 m)   Wt 224 lb 3.2 oz (101.7 kg)   SpO2 96%   BMI 30.41 kg/m   Gen: NAD, resting comfortably CV: RRR with no murmurs appreciated Pulm: NWOB, CTAB with no crackles, wheezes, or rhonchi  Assessment/Plan:  Hyperglycemia Continue metformin 1000mg   twice daily.  Follow-up in 6 months to repeat A1c.  Essential hypertension At goal.  Continue Coreg 12.5 mg twice daily and lisinopril 10 mg daily.  Dermatitis Halobetasol refilled.  Benign prostatic hyperplasia with nocturia Stop Flomax and start Rapaflo 8 mg daily.  Consider urological referral if symptoms persist.   Caleb M. Jerline Pain, MD 03/10/2018 12:05 PM

## 2018-03-12 ENCOUNTER — Telehealth (INDEPENDENT_AMBULATORY_CARE_PROVIDER_SITE_OTHER): Payer: Self-pay | Admitting: Orthopedic Surgery

## 2018-03-12 NOTE — Telephone Encounter (Signed)
Called and lm on vm to advise that the dictation for him 02/24/18 visit details the reasons why the pt is needing a new socket. That [facility] will use this office visit note when they submit the required documentation to Medicare for review. If there is anything that I can do to please call back and let me know. The dictation is very detailed to the problems the current prosthetic is causing to his balance, skin integrity the effect it is having in his ability to ambulate independently in the community

## 2018-03-12 NOTE — Telephone Encounter (Signed)
Pt's prothesis from [employer] does not work for him.  Dr Sharol Given gave patient RX for [facility] to provide a new prosthetic.  Medicare will not approve new one as it is too soon to provide another new one.  Pt called Medicare and was told they need note showing medical necessity for new prosthetic prior to their time restrictions for providing a new one.   [facility] probably needs this info to get authorization from Medicare.  Advised pt we would work with Museum/gallery curator to see what is needed for approval and someone will call him back with result.

## 2018-03-20 ENCOUNTER — Telehealth: Payer: Self-pay | Admitting: Family Medicine

## 2018-03-20 NOTE — Telephone Encounter (Signed)
See note

## 2018-03-20 NOTE — Telephone Encounter (Signed)
Copied from [place]. Topic: Quick Communication - Rx Refill/Question >> Mar 20, 2018 10:20 AM [name] wrote: Medication:  Pt states that he has questions about silodosin (RAPAFLO) 8 MG CAPS capsule.  Pt states that he feels the medication he was on before actually did better for him and he wants to know if he can switch to Tamsulosin.   [place] [id number] [place] [address], [place] [phone] (Phone) [phone] (Fax). Pt states he doesn't need a new script because he has some left but wants to make sure it's okay to switch. Pt can be reached at [phone]

## 2018-03-20 NOTE — Telephone Encounter (Signed)
Samoset with me.   Algis Greenhouse. Jerline Pain, MD 03/20/2018 12:18 PM

## 2018-03-20 NOTE — Telephone Encounter (Signed)
Please advise 

## 2018-03-20 NOTE — Telephone Encounter (Signed)
LM on patient's voicemail notifying him that it is ok for him to start taking tamsulosin again.

## 2018-04-06 ENCOUNTER — Other Ambulatory Visit: Payer: Self-pay | Admitting: Internal Medicine

## 2018-04-06 DIAGNOSIS — N401 Enlarged prostate with lower urinary tract symptoms: Secondary | ICD-10-CM

## 2018-04-06 DIAGNOSIS — R351 Nocturia: Principal | ICD-10-CM

## 2018-04-07 ENCOUNTER — Other Ambulatory Visit: Payer: Self-pay | Admitting: Family Medicine

## 2018-04-09 ENCOUNTER — Other Ambulatory Visit: Payer: Self-pay

## 2018-04-09 MED ORDER — GLUCOSE BLOOD VI STRP: ORAL_STRIP | 0 refills | Status: DC

## 2018-04-28 DIAGNOSIS — H5203 Hypermetropia, bilateral: Secondary | ICD-10-CM | POA: Diagnosis not present

## 2018-04-28 DIAGNOSIS — E119 Type 2 diabetes mellitus without complications: Secondary | ICD-10-CM | POA: Diagnosis not present

## 2018-04-28 DIAGNOSIS — Z961 Presence of intraocular lens: Secondary | ICD-10-CM | POA: Diagnosis not present

## 2018-04-28 LAB — HM DIABETES EYE EXAM

## 2018-05-12 ENCOUNTER — Ambulatory Visit (INDEPENDENT_AMBULATORY_CARE_PROVIDER_SITE_OTHER): Payer: Medicare Other | Admitting: Internal Medicine

## 2018-05-12 ENCOUNTER — Encounter: Payer: Self-pay | Admitting: Internal Medicine

## 2018-05-12 VITALS — BP 118/54 | HR 78 | Ht 72.0 in | Wt 226.2 lb

## 2018-05-12 DIAGNOSIS — N401 Enlarged prostate with lower urinary tract symptoms: Secondary | ICD-10-CM

## 2018-05-12 DIAGNOSIS — R058 Other specified cough: Secondary | ICD-10-CM

## 2018-05-12 DIAGNOSIS — R351 Nocturia: Secondary | ICD-10-CM

## 2018-05-12 DIAGNOSIS — I10 Essential (primary) hypertension: Secondary | ICD-10-CM | POA: Diagnosis not present

## 2018-05-12 DIAGNOSIS — T464X5A Adverse effect of angiotensin-converting-enzyme inhibitors, initial encounter: Secondary | ICD-10-CM

## 2018-05-12 DIAGNOSIS — R05 Cough: Secondary | ICD-10-CM

## 2018-05-12 DIAGNOSIS — I428 Other cardiomyopathies: Secondary | ICD-10-CM

## 2018-05-12 MED ORDER — LOSARTAN POTASSIUM 50 MG PO TABS: 50.0000 mg | ORAL_TABLET | Freq: Every day | ORAL | 3 refills | Status: DC

## 2018-05-12 NOTE — Progress Notes (Signed)
OFFICE NOTE  Chief Complaint:  Routine follow-up  Primary Care Physician: Vivi Barrack, MD  HPI:  [name] is a 77 y.o. male he was previously seen by Dr. Percival Spanish last in 2013. He was followed for PVCs at that time and underwent stress testing. There is no evidence for ischemia and no further workup was recommended. Recently was hospitalized for sepsis and was noted to have intermittent SVTs as well as PVCs on telemetry. Although he was not seen by cardiology as an inpatient, we were consult it for outpatient monitor. He was placed on a monitor which I reviewed and is still in progress. The first 24 days of the monitor show frequent PVCs as well as intermittent SVT. During this past hospitalization he had an echocardiogram which shows a newly reduced LVEF of 45-50%, with basal inferior akinesis and inferolateral hypokinesis and grade 2 diastolic dysfunction. This compares to a normal LVF and 2013 by echo. He did have a remote left heart catheterization in 2008 which showed minimal coronary artery disease. He denies any chest pain or worsening shortness of breath. He is having trouble ambulating due to problems with his right ankle. He also reports being under significant stress dealing with his wife who has Parkinson's. He is in the process of moving to friends [place].  01/30/2017  [name] returns today for follow-up. He has had a difficult year. His wife was diagnosed with Parkinson's and ultimately died of congestive heart failure. He developed a diabetic wound and then had the transtibial amputation of the right leg. He was supposed going to friends home but ultimately went to [facility] for rehabilitation and is currently back at home. He does have a leg prostheses. He continues to have SVT in fact has a short RP tachycardia which was caught today in the office. He says is asymptomatic with this. EF is shown to be 45-50% in the past. He did undergo nuclear stress test which was  negative for ischemia prior to his amputation. He denies any chest pain.  09/03/2017  [name] returns today for follow-up.  He has no new complaints today.  He has been struggling with some back pain.  He feels like his right leg prosthesis may be a little short.  He denies any recurrent SVT.  He was placed on the carvedilol for cardiomyopathy with EF 45-50% in 2017 and a blood pressures been well controlled on that.  He denies any chest pain.  He reports occasional shortness of breath sometimes with exertion but also at rest.  10/02/2017  [name] was seen today in follow-up.  He continues to get some care from the [facility].  He did report some problems with his right leg prosthesis.  He has not been able to exercise since then.  He gets mild shortness of breath with exertion, consistent with NYHA class II symptoms.  Recent echo was performed to see if he had any improvement in LV function, unfortunately however he had a decline in LVEF to 40 to 45%.  There is additionally some mild to moderate RV dysfunction.  He is only on carvedilol.  I think he would benefit from the addition of Entresto.  Labs last year indicated normal renal function.  11/12/2017  [name] returns today for follow-up.  His main concerns are frequent urination both throughout the day and at night.  He was started on tamsulosin by his PCP which he takes 0.4 mg in the morning.  He does not  note any significant improvement.  I had started him on Entresto however due to frequent urination he discontinued the medication.  He was then switched to lisinopril.  He seems to be tolerating this.  LVEF had declined recently to 40 to 45%.  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  05/12/2018  [name] is seen today in follow-up.  He does report some improvement in his urination.  I increase his dose to 0.8 mg tamsulosin however he said he had excessive urination with this and seems  to do better on the 0.4 mg.  His PCP had switched him to Rapaflo, but he said that was also worse.  He denies any worsening shortness of breath or chest pain.  He unfortunately was intolerant of Entresto due to frequent urination.  His EF had lower down to 40 to 45%.  Hopefully this is all related to his infection and now he is status post amputation and recovering.  He will need a repeat assessment of his EF next summer.  He also is reported some cough.  This might be related to lisinopril.  PMHx:  Past Medical History:  Diagnosis Date  . CAD (coronary artery disease)    Minimal plaque cath 2008  . Cancer ([place])    skin cancer - basal  . Cataract    both  . Complication of anesthesia   . Diabetes mellitus    Type II  . ED (erectile dysfunction)   . History of kidney stones    x1  . Hypertension   . Osteoarthritis   . PONV (postoperative nausea and vomiting) 1960   with Ether  . PVC's (premature ventricular contractions)   . Rheumatic fever   . Rhinitis     Past Surgical History:  Procedure Laterality Date  . AMPUTATION Right 02/29/2016   Procedure: Right Leg AMPUTATION BELOW KNEE with Wound Vac placement;  Surgeon: [name], MD;  Location: [facility];  Service: Orthopedics;  Laterality: Right;  . CHOLECYSTECTOMY  1983  . colonoscopy  2006, 2010  . EYE SURGERY Bilateral    cataract  . INGUINAL HERNIA REPAIR Right 1960  . STUMP REVISION Right 10/30/2017   Procedure: RIGHT BELOW KNEE AMPUTATION REVISION;  Surgeon: [name], MD;  Location: [facility];  Service: Orthopedics;  Laterality: Right;    FAMHx:  Family History  Problem Relation Age of Onset  . Stroke Father 73       Died age 75  . Coronary artery disease Brother 57  . Colon cancer Neg Hx   . Colon polyps Neg Hx   . Diabetes Neg Hx   . Esophageal cancer Neg Hx   . Kidney disease Neg Hx   . Gallbladder disease Neg Hx     SOCHx:   reports that he quit smoking about 36 years ago. He quit after 20.00 years of use.  He has never used smokeless tobacco. He reports previous alcohol use. He reports that he does not use drugs.  ALLERGIES:  No Known Allergies  ROS: Pertinent items noted in HPI and remainder of comprehensive ROS otherwise negative.  HOME MEDS: Current Outpatient Medications  Medication Sig Dispense Refill  . aspirin EC 81 MG tablet Take 81 mg by mouth daily.    Marland Kitchen aspirin-sod bicarb-citric acid (ALKA-SELTZER) 325 MG TBEF tablet Take 650 mg by mouth daily as needed (heart burn).    . carvedilol (COREG) 12.5 MG  tablet TAKE 1 TABLET (12.5 MG TOTAL) BY MOUTH 2 (TWO) TIMES DAILY. 180 tablet 2  . gabapentin (NEURONTIN) 100 MG capsule Take 1 capsule (100 mg total) by mouth 3 (three) times daily. (Patient taking differently: Take 100 mg by mouth 3 (three) times daily as needed (pain). ) 270 capsule 3  . glucose blood (ACCU-CHEK AVIVA PLUS) test strip USE TO TEST DAILY AS INSTRUCTED PER MD**EMERGENCY FILL MEDICAL REASON** 100 each 0  . halobetasol (ULTRAVATE) 0.05 % cream APPLY TO AFFECTED AREA TWICE A DAY 50 g 2  . hydrocortisone cream 1 % Apply 1 application topically daily as needed for itching.    . Lancets (ACCU-CHEK MULTICLIX) lancets USE TO TEST ONCE DAILY 102 each 2  . lisinopril (PRINIVIL,ZESTRIL) 10 MG tablet Take 1 tablet (10 mg total) by mouth daily. 90 tablet 3  . loratadine (CLARITIN) 10 MG tablet Take 10 mg by mouth daily.     Marland Kitchen LORazepam (ATIVAN) 0.5 MG tablet Take 1 tablet (0.5 mg total) by mouth every 8 (eight) hours as needed for anxiety. 15 tablet 0  . metFORMIN (GLUCOPHAGE) 1000 MG tablet TAKE 1 TABLET (1,000 MG TOTAL) BY MOUTH 2 (TWO) TIMES DAILY. 180 tablet 2  . methocarbamol (ROBAXIN) 500 MG tablet TAKE 1 TABLET EVERY 6 HOURS AS NEEDED MUSCLE SPASMS 30 tablet 0  . Multiple Vitamin (MULTIVITAMIN) tablet Take 1 tablet by mouth daily.    Marland Kitchen nystatin cream (MYCOSTATIN) APPLY TO AFFECTED AREA TWICE A DAY 30 g 2  . potassium chloride SA (KLOR-CON M20) 20 MEQ tablet TAKE 2 TABLETS IN  THE MORNING 180 tablet 3  . silodosin (RAPAFLO) 8 MG CAPS capsule Take 1 capsule (8 mg total) by mouth daily with breakfast. 30 capsule 11  . tamsulosin (FLOMAX) 0.4 MG CAPS capsule TAKE 1 CAPSULE BY MOUTH 2 (TWO) TIMES DAILY. 180 capsule 1  . Tetrahydrozoline HCl (VISINE OP) Place 1 drop into both eyes daily as needed (dry eyes).    . traMADol (ULTRAM) 50 MG tablet Take 1 tablet (50 mg total) by mouth every 6 (six) hours as needed for moderate pain. 60 tablet 0  . TURMERIC PO Take 1 tablet by mouth daily as needed (FOR LEG CRAMPS).      No current facility-administered medications for this visit.     LABS/IMAGING: No results found for this or any previous visit (from the past 48 hour(s)). No results found.  WEIGHTS: Wt Readings from Last 3 Encounters:  05/12/18 226 lb 3.2 oz (102.6 kg)  03/10/18 224 lb 3.2 oz (101.7 kg)  11/20/17 215 lb (97.5 kg)    VITALS: BP (!) 118/54   Pulse 78   Ht 6' (1.829 m)   Wt 226 lb 3.2 oz (102.6 kg)   BMI 30.68 kg/m   EXAM: General appearance: alert, no distress and mildly obese Lungs: clear to auscultation bilaterally Heart: regular rate and rhythm, S1, S2 normal, no murmur, click, rub or gallop Abdomen: soft, non-tender; bowel sounds normal; no masses,  no organomegaly Extremities: Trace left lower extremity edema, status post right BKA Psych: Mildly anxious  EKG: Sinus rhythm with PVCs at 78, moderate voltage criteria for LVH, nonspecific ST changes-personally reviewed  ASSESSMENT: 1. Worsening cardiomyopathy with EF 40-45% (08/2017), NYHA Class II symptoms 2. Recurrent short RP tachycardia  3. Hypertension 4. Type 2 diabetes 5. Right BKA  PLAN: 1.   [name] is doing well and now has a new prosthesis.  He denies any worsening shortness of breath.  His  EF had worsened and I increased his lisinopril.  Blood pressure is as low as tolerable at this point however he has had cough.  This may be ACE inhibitor related.  We will switch him over  from lisinopril to losartan 50 mg daily.  He will washout for 3 days prior to that.  Follow-up with me in 6 months with a repeat echo.  Pixie Casino, MD, [facility], [place] Director of the Advanced Lipid Disorders &  Cardiovascular Risk Reduction Clinic Diplomate of the American Board of Clinical Lipidology Attending Cardiologist  Direct Dial: [phone]  Fax: [phone]  Website:  [email or web address] Osgood [name] 05/12/2018, 10:33 AM

## 2018-05-12 NOTE — Patient Instructions (Addendum)
Medication Instructions:  STOP lisinopril START losartan 50mg  daily after you have been OFF lisinopril for 2 full days If you need a refill on your cardiac medications before your next appointment, please call your pharmacy.   Follow-Up: At [facility], you and your health needs are our priority.  As part of our continuing mission to provide you with exceptional heart care, we have created designated Provider Care Teams.  These Care Teams include your primary Cardiologist (physician) and Advanced Practice Providers (APPs -  Physician Assistants and Nurse Practitioners) who all work together to provide you with the care you need, when you need it. You will need a follow up appointment in 6 months.  Please call our office 2 months in advance to schedule this appointment.  You may see Pixie Casino, MD or one of the following Advanced Practice Providers on your designated Care Team: [place], [place] . Fabian Sharp, PA-C  Any Other Special Instructions Will Be Listed Below (If Applicable).  Your physician has requested that you have an echocardiogram. Echocardiography is a painless test that uses sound waves to create images of your heart. It provides your doctor with information about the size and shape of your heart and how well your heart's chambers and valves are working. This procedure takes approximately one hour. There are no restrictions for this procedure. -- to be completed approximately June 2020 - prior to next visit with Dr. Debara Pickett

## 2018-05-15 ENCOUNTER — Telehealth: Payer: Self-pay | Admitting: Internal Medicine

## 2018-05-15 MED ORDER — LOSARTAN POTASSIUM 50 MG PO TABS: 50.0000 mg | ORAL_TABLET | Freq: Every day | ORAL | 3 refills | Status: DC

## 2018-05-15 NOTE — Telephone Encounter (Signed)
Patient requested we send to [facility]  on [address].He will call back id there is an issue getting losartan there

## 2018-05-15 NOTE — Telephone Encounter (Signed)
° ° °  Pt c/o medication issue:  1. Name of Medication: losartan  2. How are you currently taking this medication (dosage and times per day)? N/A  3. Are you having a reaction (difficulty breathing--STAT)? NO  4. What is your medication issue? Losartan is out of stock. Patient requesting to take Lisinopril,   Call patient at [phone]

## 2018-06-03 ENCOUNTER — Other Ambulatory Visit: Payer: Self-pay

## 2018-06-03 ENCOUNTER — Observation Stay (HOSPITAL_COMMUNITY)
Admission: EM | Admit: 2018-06-03 | Discharge: 2018-06-04 | Disposition: A | Discharge status: Home / Self Care | Payer: Medicare Other | Attending: Internal Medicine | Admitting: Internal Medicine

## 2018-06-03 ENCOUNTER — Emergency Department (HOSPITAL_COMMUNITY): Payer: Medicare Other

## 2018-06-03 ENCOUNTER — Encounter (HOSPITAL_COMMUNITY)
Admission: EM | Disposition: A | Discharge status: Home / Self Care | Payer: Self-pay | Source: Home / Self Care | Attending: Emergency Medicine

## 2018-06-03 ENCOUNTER — Encounter (HOSPITAL_COMMUNITY): Payer: Self-pay | Admitting: Emergency Medicine

## 2018-06-03 DIAGNOSIS — I2510 Atherosclerotic heart disease of native coronary artery without angina pectoris: Secondary | ICD-10-CM

## 2018-06-03 DIAGNOSIS — M1990 Unspecified osteoarthritis, unspecified site: Secondary | ICD-10-CM | POA: Insufficient documentation

## 2018-06-03 DIAGNOSIS — R079 Chest pain, unspecified: Secondary | ICD-10-CM | POA: Diagnosis not present

## 2018-06-03 DIAGNOSIS — E1165 Type 2 diabetes mellitus with hyperglycemia: Secondary | ICD-10-CM | POA: Diagnosis present

## 2018-06-03 DIAGNOSIS — Z888 Allergy status to other drugs, medicaments and biological substances status: Secondary | ICD-10-CM | POA: Diagnosis not present

## 2018-06-03 DIAGNOSIS — Z823 Family history of stroke: Secondary | ICD-10-CM | POA: Insufficient documentation

## 2018-06-03 DIAGNOSIS — R0789 Other chest pain: Secondary | ICD-10-CM | POA: Diagnosis not present

## 2018-06-03 DIAGNOSIS — E1151 Type 2 diabetes mellitus with diabetic peripheral angiopathy without gangrene: Secondary | ICD-10-CM | POA: Diagnosis not present

## 2018-06-03 DIAGNOSIS — I5022 Chronic systolic (congestive) heart failure: Secondary | ICD-10-CM | POA: Diagnosis not present

## 2018-06-03 DIAGNOSIS — E1159 Type 2 diabetes mellitus with other circulatory complications: Secondary | ICD-10-CM | POA: Diagnosis present

## 2018-06-03 DIAGNOSIS — E785 Hyperlipidemia, unspecified: Secondary | ICD-10-CM | POA: Diagnosis not present

## 2018-06-03 DIAGNOSIS — I471 Supraventricular tachycardia: Secondary | ICD-10-CM | POA: Insufficient documentation

## 2018-06-03 DIAGNOSIS — I493 Ventricular premature depolarization: Secondary | ICD-10-CM | POA: Diagnosis not present

## 2018-06-03 DIAGNOSIS — Z89511 Acquired absence of right leg below knee: Secondary | ICD-10-CM | POA: Insufficient documentation

## 2018-06-03 DIAGNOSIS — I110 Hypertensive heart disease with heart failure: Secondary | ICD-10-CM | POA: Diagnosis not present

## 2018-06-03 DIAGNOSIS — R739 Hyperglycemia, unspecified: Secondary | ICD-10-CM

## 2018-06-03 DIAGNOSIS — I10 Essential (primary) hypertension: Secondary | ICD-10-CM | POA: Diagnosis not present

## 2018-06-03 DIAGNOSIS — R55 Syncope and collapse: Secondary | ICD-10-CM | POA: Diagnosis present

## 2018-06-03 DIAGNOSIS — I25119 Atherosclerotic heart disease of native coronary artery with unspecified angina pectoris: Secondary | ICD-10-CM | POA: Diagnosis not present

## 2018-06-03 DIAGNOSIS — I491 Atrial premature depolarization: Secondary | ICD-10-CM | POA: Diagnosis not present

## 2018-06-03 DIAGNOSIS — Z8249 Family history of ischemic heart disease and other diseases of the circulatory system: Secondary | ICD-10-CM | POA: Insufficient documentation

## 2018-06-03 DIAGNOSIS — I428 Other cardiomyopathies: Secondary | ICD-10-CM | POA: Insufficient documentation

## 2018-06-03 DIAGNOSIS — I152 Hypertension secondary to endocrine disorders: Secondary | ICD-10-CM | POA: Diagnosis present

## 2018-06-03 DIAGNOSIS — Z87891 Personal history of nicotine dependence: Secondary | ICD-10-CM | POA: Diagnosis not present

## 2018-06-03 DIAGNOSIS — Z7984 Long term (current) use of oral hypoglycemic drugs: Secondary | ICD-10-CM | POA: Insufficient documentation

## 2018-06-03 DIAGNOSIS — I499 Cardiac arrhythmia, unspecified: Secondary | ICD-10-CM | POA: Diagnosis not present

## 2018-06-03 DIAGNOSIS — Z79899 Other long term (current) drug therapy: Secondary | ICD-10-CM | POA: Diagnosis not present

## 2018-06-03 DIAGNOSIS — I5042 Chronic combined systolic (congestive) and diastolic (congestive) heart failure: Secondary | ICD-10-CM | POA: Insufficient documentation

## 2018-06-03 DIAGNOSIS — E119 Type 2 diabetes mellitus without complications: Secondary | ICD-10-CM | POA: Diagnosis present

## 2018-06-03 DIAGNOSIS — Z7982 Long term (current) use of aspirin: Secondary | ICD-10-CM | POA: Insufficient documentation

## 2018-06-03 HISTORY — DX: Supraventricular tachycardia: I47.1

## 2018-06-03 HISTORY — DX: Acquired absence of right leg below knee: Z89.511

## 2018-06-03 HISTORY — DX: Supraventricular tachycardia, unspecified: I47.10

## 2018-06-03 HISTORY — DX: Other cardiomyopathies: I42.8

## 2018-06-03 HISTORY — PX: LEFT HEART CATH AND CORONARY ANGIOGRAPHY: CATH118249

## 2018-06-03 HISTORY — DX: Peripheral vascular disease, unspecified: I73.9

## 2018-06-03 HISTORY — DX: Chronic combined systolic (congestive) and diastolic (congestive) heart failure: I50.42

## 2018-06-03 LAB — GLUCOSE, CAPILLARY
Glucose-Capillary: 112 mg/dL — ABNORMAL HIGH (ref 70–99)
Glucose-Capillary: 171 mg/dL — ABNORMAL HIGH (ref 70–99)

## 2018-06-03 LAB — CBG MONITORING, ED
Glucose-Capillary: 115 mg/dL — ABNORMAL HIGH (ref 70–99)
Glucose-Capillary: 103 mg/dL — ABNORMAL HIGH (ref 70–99)

## 2018-06-03 LAB — CBC
HCT: 41.6 % (ref 39.0–52.0)
Hemoglobin: 13.8 g/dL (ref 13.0–17.0)
MCH: 29.7 pg (ref 26.0–34.0)
MCHC: 33.2 g/dL (ref 30.0–36.0)
MCV: 89.7 fL (ref 80.0–100.0)
Platelets: 236 10*3/uL (ref 150–400)
RBC: 4.64 MIL/uL (ref 4.22–5.81)
RDW: 13.1 % (ref 11.5–15.5)
WBC: 7.7 10*3/uL (ref 4.0–10.5)
nRBC: 0 % (ref 0.0–0.2)

## 2018-06-03 LAB — BASIC METABOLIC PANEL
Anion gap: 12 (ref 5–15)
BUN: 18 mg/dL (ref 8–23)
CO2: 21 mmol/L — ABNORMAL LOW (ref 22–32)
Calcium: 9.3 mg/dL (ref 8.9–10.3)
Chloride: 106 mmol/L (ref 98–111)
Creatinine, Ser: 0.86 mg/dL (ref 0.61–1.24)
GFR calc Af Amer: >60 mL/min (ref >60)
GFR calc non Af Amer: >60 mL/min (ref >60)
Glucose, Bld: 132 mg/dL — ABNORMAL HIGH (ref 70–99)
Potassium: 4.5 mmol/L (ref 3.5–5.1)
Sodium: 139 mmol/L (ref 135–145)

## 2018-06-03 LAB — I-STAT TROPONIN, ED
Troponin i, poc: 0 ng/mL (ref 0.00–0.08)
Troponin i, poc: 0.07 ng/mL (ref 0.00–0.08)

## 2018-06-03 LAB — TROPONIN I
Troponin I: <0.03 ng/mL (ref <0.03)
Troponin I: <0.03 ng/mL (ref <0.03)
Troponin I: <0.03 ng/mL (ref <0.03)

## 2018-06-03 LAB — TSH: TSH: 1.84 u[IU]/mL (ref 0.350–4.500)

## 2018-06-03 LAB — MAGNESIUM: Magnesium: 1.8 mg/dL (ref 1.7–2.4)

## 2018-06-03 SURGERY — LEFT HEART CATH AND CORONARY ANGIOGRAPHY
Anesthesia: LOCAL

## 2018-06-03 MED ORDER — LIDOCAINE HCL (PF) 1 % IJ SOLN
INTRAMUSCULAR | Status: DC | PRN
  Administered 2018-06-03: 2 mL

## 2018-06-03 MED ORDER — SODIUM CHLORIDE 0.9 % IV SOLN
INTRAVENOUS | Status: DC
  Administered 2018-06-03: 75 mL/h via INTRAVENOUS

## 2018-06-03 MED ORDER — LACTATED RINGERS IV SOLN: INTRAVENOUS | Status: DC

## 2018-06-03 MED ORDER — SODIUM CHLORIDE 0.9 % WEIGHT BASED INFUSION: 0.5000 mL/kg/h | INTRAVENOUS | Status: AC

## 2018-06-03 MED ORDER — ASPIRIN 81 MG PO CHEW: 81.0000 mg | CHEWABLE_TABLET | ORAL | Status: DC

## 2018-06-03 MED ORDER — LORAZEPAM 0.5 MG PO TABS: 0.5000 mg | ORAL_TABLET | Freq: Three times a day (TID) | ORAL | Status: DC | PRN

## 2018-06-03 MED ORDER — LOSARTAN POTASSIUM 50 MG PO TABS
50.0000 mg | ORAL_TABLET | Freq: Every day | ORAL | Status: DC
  Administered 2018-06-03 – 2018-06-04 (×2): 50 mg via ORAL
  Filled 2018-06-03 (×2): qty 1

## 2018-06-03 MED ORDER — VERAPAMIL HCL 2.5 MG/ML IV SOLN
INTRAVENOUS | Status: DC | PRN
  Administered 2018-06-03: 10 mL via INTRA_ARTERIAL

## 2018-06-03 MED ORDER — SODIUM CHLORIDE 0.9% FLUSH
3.0000 mL | Freq: Two times a day (BID) | INTRAVENOUS | Status: DC
  Administered 2018-06-03: 3 mL via INTRAVENOUS

## 2018-06-03 MED ORDER — ONDANSETRON HCL 4 MG/2ML IJ SOLN: 4.0000 mg | Freq: Four times a day (QID) | INTRAMUSCULAR | Status: DC | PRN

## 2018-06-03 MED ORDER — FENTANYL CITRATE (PF) 100 MCG/2ML IJ SOLN
INTRAMUSCULAR | Status: DC | PRN
  Administered 2018-06-03: 25 ug via INTRAVENOUS

## 2018-06-03 MED ORDER — GABAPENTIN 100 MG PO CAPS
100.0000 mg | ORAL_CAPSULE | Freq: Two times a day (BID) | ORAL | Status: DC
  Administered 2018-06-03 – 2018-06-04 (×3): 100 mg via ORAL
  Filled 2018-06-03 (×3): qty 1

## 2018-06-03 MED ORDER — ENOXAPARIN SODIUM 40 MG/0.4ML ~~LOC~~ SOLN
40.0000 mg | SUBCUTANEOUS | Status: DC
  Filled 2018-06-03: qty 0.4

## 2018-06-03 MED ORDER — HEPARIN (PORCINE) IN NACL 1000-0.9 UT/500ML-% IV SOLN
INTRAVENOUS | Status: DC | PRN
  Administered 2018-06-03 (×2): 500 mL

## 2018-06-03 MED ORDER — ASPIRIN EC 81 MG PO TBEC
81.0000 mg | DELAYED_RELEASE_TABLET | Freq: Every day | ORAL | Status: DC
  Administered 2018-06-03: 81 mg via ORAL
  Filled 2018-06-03: qty 1

## 2018-06-03 MED ORDER — OXYCODONE HCL 5 MG PO TABS: 5.0000 mg | ORAL_TABLET | ORAL | Status: DC | PRN

## 2018-06-03 MED ORDER — FENTANYL CITRATE (PF) 100 MCG/2ML IJ SOLN
INTRAMUSCULAR | Status: AC
  Filled 2018-06-03: qty 2

## 2018-06-03 MED ORDER — HEPARIN (PORCINE) IN NACL 1000-0.9 UT/500ML-% IV SOLN
INTRAVENOUS | Status: AC
  Filled 2018-06-03: qty 1000

## 2018-06-03 MED ORDER — TRAMADOL HCL 50 MG PO TABS
50.0000 mg | ORAL_TABLET | Freq: Four times a day (QID) | ORAL | Status: DC | PRN
  Administered 2018-06-03: 50 mg via ORAL
  Filled 2018-06-03: qty 1

## 2018-06-03 MED ORDER — SODIUM CHLORIDE 0.9 % IV SOLN: 250.0000 mL | INTRAVENOUS | Status: DC | PRN

## 2018-06-03 MED ORDER — SODIUM CHLORIDE 0.9% FLUSH
3.0000 mL | Freq: Two times a day (BID) | INTRAVENOUS | Status: DC
  Administered 2018-06-04 (×2): 3 mL via INTRAVENOUS

## 2018-06-03 MED ORDER — INSULIN ASPART 100 UNIT/ML ~~LOC~~ SOLN: 0.0000 [IU] | Freq: Three times a day (TID) | SUBCUTANEOUS | Status: DC

## 2018-06-03 MED ORDER — VERAPAMIL HCL 2.5 MG/ML IV SOLN
INTRAVENOUS | Status: AC
  Filled 2018-06-03: qty 2

## 2018-06-03 MED ORDER — HEPARIN SODIUM (PORCINE) 1000 UNIT/ML IJ SOLN
INTRAMUSCULAR | Status: DC | PRN
  Administered 2018-06-03: 5000 [IU] via INTRAVENOUS

## 2018-06-03 MED ORDER — NITROGLYCERIN 0.4 MG SL SUBL: 0.4000 mg | SUBLINGUAL_TABLET | SUBLINGUAL | Status: DC | PRN

## 2018-06-03 MED ORDER — SODIUM CHLORIDE 0.9% FLUSH: 3.0000 mL | INTRAVENOUS | Status: DC | PRN

## 2018-06-03 MED ORDER — MIDAZOLAM HCL 2 MG/2ML IJ SOLN
INTRAMUSCULAR | Status: DC | PRN
  Administered 2018-06-03: 1 mg via INTRAVENOUS

## 2018-06-03 MED ORDER — IOHEXOL 350 MG/ML SOLN
INTRAVENOUS | Status: DC | PRN
  Administered 2018-06-03: 70 mL

## 2018-06-03 MED ORDER — LIDOCAINE HCL (PF) 1 % IJ SOLN
INTRAMUSCULAR | Status: AC
  Filled 2018-06-03: qty 30

## 2018-06-03 MED ORDER — ASPIRIN 81 MG PO CHEW
81.0000 mg | CHEWABLE_TABLET | Freq: Every day | ORAL | Status: DC
  Administered 2018-06-04: 81 mg via ORAL
  Filled 2018-06-03: qty 1

## 2018-06-03 MED ORDER — SODIUM CHLORIDE 0.9% FLUSH
3.0000 mL | Freq: Two times a day (BID) | INTRAVENOUS | Status: DC
  Administered 2018-06-03 – 2018-06-04 (×2): 3 mL via INTRAVENOUS

## 2018-06-03 MED ORDER — ENOXAPARIN SODIUM 40 MG/0.4ML ~~LOC~~ SOLN
40.0000 mg | SUBCUTANEOUS | Status: DC
  Administered 2018-06-04: 40 mg via SUBCUTANEOUS
  Filled 2018-06-03: qty 0.4

## 2018-06-03 MED ORDER — MORPHINE SULFATE (PF) 2 MG/ML IV SOLN: 2.0000 mg | INTRAVENOUS | Status: DC | PRN

## 2018-06-03 MED ORDER — TAMSULOSIN HCL 0.4 MG PO CAPS
0.4000 mg | ORAL_CAPSULE | Freq: Every day | ORAL | Status: DC
  Administered 2018-06-03 – 2018-06-04 (×2): 0.4 mg via ORAL
  Filled 2018-06-03 (×2): qty 1

## 2018-06-03 MED ORDER — CARVEDILOL 12.5 MG PO TABS
12.5000 mg | ORAL_TABLET | Freq: Two times a day (BID) | ORAL | Status: DC
  Administered 2018-06-03 (×2): 12.5 mg via ORAL
  Filled 2018-06-03 (×3): qty 1

## 2018-06-03 MED ORDER — ACETAMINOPHEN 325 MG PO TABS: 650.0000 mg | ORAL_TABLET | ORAL | Status: DC | PRN

## 2018-06-03 MED ORDER — ACETAMINOPHEN 325 MG PO TABS
650.0000 mg | ORAL_TABLET | ORAL | Status: DC | PRN
  Administered 2018-06-03: 325 mg via ORAL
  Filled 2018-06-03: qty 2

## 2018-06-03 MED ORDER — MIDAZOLAM HCL 2 MG/2ML IJ SOLN
INTRAMUSCULAR | Status: AC
  Filled 2018-06-03: qty 2

## 2018-06-03 MED ORDER — METHOCARBAMOL 500 MG PO TABS: 500.0000 mg | ORAL_TABLET | Freq: Four times a day (QID) | ORAL | Status: DC | PRN

## 2018-06-03 SURGICAL SUPPLY — 13 items
CATH INFINITI 5 FR JL3.5 (CATHETERS) ×1 IMPLANT
CATH INFINITI JR4 5F (CATHETERS) ×1 IMPLANT
CATH LAUNCHER 5F RADR (CATHETERS) IMPLANT
CATHETER LAUNCHER 5F RADR (CATHETERS) ×2
DEVICE RAD COMP TR BAND LRG (VASCULAR PRODUCTS) ×1 IMPLANT
GLIDESHEATH SLEND A-KIT 6F 22G (SHEATH) ×1 IMPLANT
GUIDEWIRE INQWIRE 1.5J.035X260 (WIRE) IMPLANT
INQWIRE 1.5J .035X260CM (WIRE) ×2
KIT HEART LEFT (KITS) ×2 IMPLANT
PACK CARDIAC CATHETERIZATION (CUSTOM PROCEDURE TRAY) ×2 IMPLANT
TRANSDUCER W/STOPCOCK (MISCELLANEOUS) ×2 IMPLANT
TUBING CIL FLEX 10 FLL-RA (TUBING) ×2 IMPLANT
WIRE HI TORQ VERSACORE-J 145CM (WIRE) ×1 IMPLANT

## 2018-06-03 NOTE — CV Procedure (Signed)
   Left heart cath with coronary angiography and left ventricular hemodynamic recordings via right radial.  Procedure was quite difficult due to significant angulation in the innominate artery/aorta bifurcation making catheter movement and torque difficult.  Real-time vascular ultrasound was to assist access.  Widely patent left main  50% mid LAD diagonal bifurcation ([place] 011 post (  Circumflex contains luminal irregularities but is widely patent  Right coronary contains luminal irregularities but is widely patent  LVEDP 12 mmHg  No immediate complications

## 2018-06-03 NOTE — H&P (View-Only) (Signed)
Cardiology Consultation:   Patient ID: [name]; [id number]; 1940/11/15   Admit date: 06/03/2018 Date of Consult: 06/03/2018  Primary Care Provider: Vivi Barrack, MD Primary Cardiologist: Pixie Casino, MD Primary Electrophysiologist:  None  Chief Complaint: severe chest pain then passed out  Patient Profile:   [name] is a 78 y.o. male with a hx of paroxysmal SVT, chronic combined CHF EF 40-45% felt due to NICM, PVCs, DM, diabetic wound s/p transtibial amputation of R leg, minimal CAD by cath 2008, HTN, rheumatic fever, ED who is being seen today for the evaluation of chest pain and syncope at the request of Dr. Lorin Mercy.  History of Present Illness:   Remote cath 2008 for chest pain showed 30% LAD with luminal irregularity and fairly heavy calcification in the mid LAD without critical stenosis, 30% OM1, 30% acute marginal, normal EF at that time. In 2017 an echo showed EF 45-50%. Holter 12/2015 showed NSR, occasional PVCs, sinus bradycardia and frequent sustained runs of SVT with rates in the 150s. Nuclear stress test 01/2016 showed no ischemia, not gated due to ectopy. It appears referral to EP was arranged but did not occur. His beta blocker was adjusted. In 08/2017, echo showed EF 40-45% with diffuse HK, mildly dilated aortic root, moderate LAE, mildly dilated RV with mid-moderate RV hypokinesis. He was trialed on Entresto but did not tolerate due to increased urinary frequency.  He was last seen 05/12/18 by Dr. Debara Pickett and felt to be doing well. Lisinopril 40mg  was changed to losartan 50mg  due to cough with plans for a repeat echo in 6 months.   He reports for years he's had brief intermittent sharp chest pains without particular provocation, worse at night. They last a second or two then go away without intervention. They are not worse with inspiration or palpation. He goes to the [facility] 3 days per week with light exercise on a recumbent bike with hand pedals and lifts some light  weights without recent exertional angina or dyspnea.  Last night around 11-12 he was lying in bed in his USOH when he suddenly developed left sided sharp chest pain. He reports it felt like someone hit him with a baseball bat. The very instant after he described a feeling of passing out like the "lights went out." He does not know how long he was out for but feels like he passed out for just a second. This was unwitnessed and occurred while lying in bed. He denies any preceding palpitations, nausea, vomiting, visual changes or diaphoresis. When he awoke he did not have any b/b incontinence or confusion. His sharp chest pain had resolved but he had mild residual L sided chest pressure for the next hour or two which was somewhat new. This gradually resolved without specific intervention but given the episode of syncope he presented to the ER for further eval. W/u reveals mild hyperglycemia of 132, troponins neg x 2, K 4.5, CBC wnl. CXR shows cardiomegaly with mild aortic atherosclerosis but no acute pulmonary disease. He is somewhat hypertensive in the ED with a peak of 174/103, most currently 154/97. Telemetry shows NSR with frequent PVCs. He is currently pain free, just hungry. He has mild LLE edema which he states is chronic appearing. He uses compression hose periodically.  Past Medical History:  Diagnosis Date  . CAD (coronary artery disease)    a. Remote cath 2008 for chest pain showed 30% LAD with luminal irregularity and fairly heavy calcification in the mid  LAD without critical stenosis, 30% OM1, 30% acute marginal.  . Cancer (HCC)    skin cancer - basal  . Cataract    both  . Chronic combined systolic and diastolic CHF (congestive heart failure) ([place])   . Complication of anesthesia   . Diabetes mellitus    Type II  . ED (erectile dysfunction)   . History of kidney stones    x1  . Hypertension   . NICM (nonischemic cardiomyopathy) ([place])    a. EF 40-45% by echo in 08/2017 - prior low risk  stress test in 2017.  . Osteoarthritis   . Paroxysmal SVT (supraventricular tachycardia) ([place])   . PONV (postoperative nausea and vomiting) 1960   with Ether  . PVC's (premature ventricular contractions)   . PVC's (premature ventricular contractions)   . PVD (peripheral vascular disease) ([place])    s/p R BKA  . Rheumatic fever   . Rhinitis   . S/P BKA (below knee amputation), right [facility])     Past Surgical History:  Procedure Laterality Date  . AMPUTATION Right 02/29/2016   Procedure: Right Leg AMPUTATION BELOW KNEE with Wound Vac placement;  Surgeon: [name], MD;  Location: [facility];  Service: Orthopedics;  Laterality: Right;  . CHOLECYSTECTOMY  1983  . colonoscopy  2006, 2010  . EYE SURGERY Bilateral    cataract  . INGUINAL HERNIA REPAIR Right 1960  . STUMP REVISION Right 10/30/2017   Procedure: RIGHT BELOW KNEE AMPUTATION REVISION;  Surgeon: [name], MD;  Location: [facility];  Service: Orthopedics;  Laterality: Right;     Inpatient Medications: Scheduled Meds: . aspirin EC  81 mg Oral Daily  . carvedilol  12.5 mg Oral BID  . enoxaparin (LOVENOX) injection  40 mg Subcutaneous Q24H  . gabapentin  100 mg Oral BID  . losartan  50 mg Oral Daily  . sodium chloride flush  3 mL Intravenous Q12H  . tamsulosin  0.4 mg Oral Daily   Continuous Infusions: . lactated ringers     PRN Meds: acetaminophen, LORazepam, methocarbamol, morphine injection, nitroGLYCERIN, ondansetron (ZOFRAN) IV, traMADol  Home Meds: Prior to Admission medications   Medication Sig Start Date End Date Taking? Authorizing Provider  aspirin EC 81 MG tablet Take 81 mg by mouth daily.   Yes [provider]  aspirin-sod bicarb-citric acid (ALKA-SELTZER) 325 MG TBEF tablet Take 650 mg by mouth daily as needed (heart burn).   Yes [provider]  carvedilol (COREG) 12.5 MG tablet TAKE 1 TABLET (12.5 MG TOTAL) BY MOUTH 2 (TWO) TIMES DAILY. 01/10/18 06/03/18 Yes Hilty, Nadean Corwin, MD  gabapentin  (NEURONTIN) 100 MG capsule Take 1 capsule (100 mg total) by mouth 3 (three) times daily. Patient taking differently: Take 100 mg by mouth 2 (two) times daily.  08/15/17  Yes [name], MD  glucose blood (ACCU-CHEK AVIVA PLUS) test strip USE TO TEST DAILY AS INSTRUCTED PER MD**EMERGENCY FILL MEDICAL REASON** 04/09/18  Yes Vivi Barrack, MD  halobetasol (ULTRAVATE) 0.05 % cream APPLY TO AFFECTED AREA TWICE A DAY Patient taking differently: Apply 1 application topically 2 (two) times daily.  03/10/18  Yes Vivi Barrack, MD  hydrocortisone cream 1 % Apply 1 application topically daily as needed for itching.   Yes [provider]  Lancets (ACCU-CHEK MULTICLIX) lancets USE TO TEST ONCE DAILY 12/16/17  Yes Dorena Cookey, MD  loratadine (CLARITIN) 10 MG tablet Take 10 mg by mouth daily as needed for allergies.  Yes [provider]  LORazepam (ATIVAN) 0.5 MG tablet Take 1 tablet (0.5 mg total) by mouth every 8 (eight) hours as needed for anxiety. 11/08/17  Yes Nafziger, Tommi Rumps, NP  losartan (COZAAR) 50 MG tablet Take 1 tablet (50 mg total) by mouth daily. 05/15/18 08/13/18 Yes Hilty, Nadean Corwin, MD  metFORMIN (GLUCOPHAGE) 1000 MG tablet TAKE 1 TABLET (1,000 MG TOTAL) BY MOUTH 2 (TWO) TIMES DAILY. 11/01/17  Yes Dorena Cookey, MD  methocarbamol (ROBAXIN) 500 MG tablet TAKE 1 TABLET EVERY 6 HOURS AS NEEDED MUSCLE SPASMS Patient taking differently: Take 500 mg by mouth every 6 (six) hours as needed for muscle spasms.  09/12/17  Yes [name], MD  Multiple Vitamin (MULTIVITAMIN) tablet Take 1 tablet by mouth daily.   Yes [provider]  nystatin cream (MYCOSTATIN) APPLY TO AFFECTED AREA TWICE A DAY Patient taking differently: Apply 1 application topically 2 (two) times daily.  09/12/17  Yes Dorena Cookey, MD  potassium chloride SA (KLOR-CON M20) 20 MEQ tablet TAKE 2 TABLETS IN THE MORNING Patient taking differently: Take 80 mEq by mouth daily. TAKE 2 TABLETS IN THE MORNING  11/11/17  Yes Dorena Cookey, MD  tamsulosin (FLOMAX) 0.4 MG CAPS capsule TAKE 1 CAPSULE BY MOUTH 2 (TWO) TIMES DAILY. Patient taking differently: Take 0.4 mg by mouth daily.  04/07/18  Yes Hilty, Nadean Corwin, MD  Tetrahydrozoline HCl (VISINE OP) Place 1 drop into both eyes daily as needed (dry eyes).   Yes [provider]  traMADol (ULTRAM) 50 MG tablet Take 1 tablet (50 mg total) by mouth every 6 (six) hours as needed for moderate pain. 11/07/17  Yes [name], MD  TURMERIC PO Take 1 tablet by mouth daily as needed (FOR LEG CRAMPS).    Yes [provider]    Allergies:    Allergies  Allergen Reactions  . Entresto [Sacubitril-Valsartan]     Intolerant due to frequent urination  . Lisinopril Cough    Social History:   Social History   Socioeconomic History  . Marital status: Married    Spouse name: Not on file  . Number of children: 1  . Years of education: 16+  . Highest education level: Not on file  Occupational History  . Occupation: Curator - [employer]    Employer: RETIRED  Social Needs  . Financial resource strain: Not on file  . Food insecurity:    Worry: Not on file    Inability: Not on file  . Transportation needs:    Medical: Not on file    Non-medical: Not on file  Tobacco Use  . Smoking status: Former Smoker    Years: 20.00    Last attempt to quit: 07/26/1981    Years since quitting: 36.8  . Smokeless tobacco: Never Used  Substance and Sexual Activity  . Alcohol use: Yes    Comment: occasional glass of wine  . Drug use: No  . Sexual activity: Never  Lifestyle  . Physical activity:    Days per week: Not on file    Minutes per session: Not on file  . Stress: Not on file  Relationships  . Social connections:    Talks on phone: Not on file    Gets together: Not on file    Attends religious service: Not on file    Active member of club or organization: Not on file    Attends meetings of clubs or organizations: Not  on file  Relationship status: Not on file  . Intimate partner violence:    Fear of current or ex partner: Not on file    Emotionally abused: Not on file    Physically abused: Not on file    Forced sexual activity: Not on file  Other Topics Concern  . Not on file  Social History Narrative   Lives with wife.   epworth sleepiness scale = 5 (01/20/16)    Family History:   The patient's family history includes Coronary artery disease (age of onset: 37) in his brother; Stroke (age of onset: 57) in his father. There is no history of Colon cancer, Colon polyps, Diabetes, Esophageal cancer, Kidney disease, or Gallbladder disease.  ROS:  Please see the history of present illness.  All other ROS reviewed and negative.     Physical Exam/Data:   Vitals:   06/03/18 0830 06/03/18 0845 06/03/18 0900 06/03/18 1000  BP: (!) 174/103  (!) 165/143 (!) 154/97  Pulse: 63 77 77 81  Resp: 18 (!) 22 20 (!) 23  SpO2: 95% 98% 96% 95%  Weight:      Height:       No intake or output data in the 24 hours ending 06/03/18 1036 Filed Weights   06/03/18 0119  Weight: 95.3 kg   Body mass index is 28.48 kg/m.  General: Well developed, well nourished, in no acute distress. Head: Normocephalic, atraumatic, sclera non-icteric, no xanthomas, nares are without discharge.  Neck: Negative for carotid bruits. JVD not elevated. Lungs: Clear bilaterally to auscultation without wheezes, rales, or rhonchi. Breathing is unlabored. Heart: RRR with S1 S2. No murmurs, rubs, or gallops appreciated. Abdomen: Soft, non-tender, non-distended with normoactive bowel sounds. No hepatomegaly. No rebound/guarding. No obvious abdominal masses. Msk:  Strength and tone appear normal for age. Extremities: No clubbing or cyanosis. No edema.  Distal pedal pulses are 2+ and equal bilaterally. Neuro: Alert and oriented X 3. No facial asymmetry. No focal deficit. Moves all extremities spontaneously. Psych:  Responds to questions  appropriately with a normal affect.  EKG:  The EKG was personally reviewed and demonstrates NSR 89bpm, occasional PVCs including one couplet, left atrial enlargement, nonspecific ST-T changes, QTc 416ms  Laboratory Data:  Chemistry Recent Labs  Lab 06/03/18 0134  NA 139  K 4.5  CL 106  CO2 21*  GLUCOSE 132*  BUN 18  CREATININE 0.86  CALCIUM 9.3  GFRNONAA >60  GFRAA >60  ANIONGAP 12    No results for input(s): PROT, ALBUMIN, AST, ALT, ALKPHOS, BILITOT in the last 168 hours. Hematology Recent Labs  Lab 06/03/18 0134  WBC 7.7  RBC 4.64  HGB 13.8  HCT 41.6  MCV 89.7  MCH 29.7  MCHC 33.2  RDW 13.1  PLT 236   Cardiac EnzymesNo results for input(s): TROPONINI in the last 168 hours.  Recent Labs  Lab 06/03/18 0136 06/03/18 0418  TROPIPOC 0.00 0.07    BNPNo results for input(s): BNP, PROBNP in the last 168 hours.  DDimer No results for input(s): DDIMER in the last 168 hours.  Radiology/Studies:  Dg Chest Portable 1 View  Result Date: 06/03/2018 CLINICAL DATA:  Chest pain and syncope tonight EXAM: PORTABLE CHEST 1 VIEW COMPARISON:  12/09/2005 FINDINGS: Cardiomegaly with mild aortic atherosclerosis. No acute pulmonary consolidation or edema. The right lateral costophrenic angle is excluded. No pleural effusion or pneumothorax. No acute osseous abnormality. IMPRESSION: Cardiomegaly with mild aortic atherosclerosis. No active pulmonary disease. Electronically Signed   By: Meredith Leeds.D.  On: 06/03/2018 02:24    Assessment and Plan:   1. Syncope and chest pain - etiology of syncope not totally clear - question vagal, precipitated by acute chest pain, but the "lights out" description is somewhat concerning given his history of LV dysfunction as well as PVCs. Continue telemetry and obtain echocardiogram. Discussed avoidance of driving until cleared by cardiologist as outpatient. His CP is somewhat atypical but certainly concerning given his presentation with syncope. As a  diabetic, he may not have typical symptoms. His last cath was in 2008 with mild CAD. Telemetry shows frequent PVCs which patient was previously known to have. Troponins negative x 2. These will continue to be cycled. Added Mag level to labs. Per discussion with MD, plan cardiac cath to definitively exclude CAD given his declining EF the last few years and presenting symptoms. Will likely need event monitoring as OP.  2. History of NICM/chronic combined CHF - appears mildly volume overloaded on exam with peripheral edema. He was not on standing diuretic as outpatient. He did not previously tolerate Entresto due to frequent urination so would need to be cautious so as not to discourage patient - spironolactone would be a consideration given h/o decreased EF. Agree with echocardiogram.  3. Essential HTN - BP elevated in ED but was relatively controlled as OP recently. Follow on home regimen.  4. PSVT - not seen on tele thus far, continue to monitor. TSH pending.  5. DM - per IM. Metformin on hold.  For questions or updates, please contact [place] Please consult [email or web address] for contact info under Cardiology/STEMI.    Signed, Charlie Pitter, PA-C  06/03/2018 10:36 AM

## 2018-06-03 NOTE — H&P (Signed)
History and Physical    ERHARDT DADA TKW:[id number] DOB: 04-23-1941 DOA: 06/03/2018  PCP: Vivi Barrack, MD Consultants:  [facility] - cardiology; Sharol Given - orthopedics Patient coming from:  Home - lives alone; [facility]: Daughter, [phone]  Chief Complaint: Syncope, chest pain  HPI: [name] is a 78 y.o. male with medical history significant of HTN; HLD; PVD s/p R BKA; and CAD with minimal plaque on cath in 2008 presenting with chest pain and syncope.  Sometime around 1130-12 last night, he noticed acute onset of severe chest pain around left breast.  Next thing he knew, he passed out.  +LOC.  He thought maybe his heart stopped beating and restarted - "it was heavy pain right there at my heart."  He thinks he was just unconscious "for an instant."  He was not confused when he came to.  He has continued to have intermittent chest pain since.  Last cath was several years ago.  Last echo was fairly recent, due again in June.  He does not currently have chest pain. The pain was present at rest, not with exertion.   ED Course: Carryover, per Dr. Hal Hope:  78 year old male with history of cardiomyopathy and SVT presents to the ER with complaints of having chest pain following a syncopal episode. Chest pain improved with sublingual nitroglycerin. Patient admitted for further work-up of syncope and chest pain.  Review of Systems: As per HPI; otherwise review of systems reviewed and negative.   Ambulatory Status:  Ambulates with prosthesis. Sometimes with a cane/walker  Past Medical History:  Diagnosis Date  . CAD (coronary artery disease)    a. Remote cath 2008 for chest pain showed 30% LAD with luminal irregularity and fairly heavy calcification in the mid LAD without critical stenosis, 30% OM1, 30% acute marginal.  . Cancer (HCC)    skin cancer - basal  . Cataract    both  . Chronic combined systolic and diastolic CHF (congestive heart failure) ([place])   . Complication of anesthesia   .  Diabetes mellitus    Type II  . ED (erectile dysfunction)   . History of kidney stones    x1  . Hypertension   . NICM (nonischemic cardiomyopathy) ([place])    a. EF 40-45% by echo in 08/2017 - prior low risk stress test in 2017.  . Osteoarthritis   . Paroxysmal SVT (supraventricular tachycardia) ([place])   . PONV (postoperative nausea and vomiting) 1960   with Ether  . PVC's (premature ventricular contractions)   . PVD (peripheral vascular disease) ([place])    s/p R BKA  . Rheumatic fever   . Rhinitis   . S/P BKA (below knee amputation), right [facility])     Past Surgical History:  Procedure Laterality Date  . AMPUTATION Right 02/29/2016   Procedure: Right Leg AMPUTATION BELOW KNEE with Wound Vac placement;  Surgeon: [name], MD;  Location: [facility];  Service: Orthopedics;  Laterality: Right;  . CHOLECYSTECTOMY  1983  . colonoscopy  2006, 2010  . EYE SURGERY Bilateral    cataract  . INGUINAL HERNIA REPAIR Right 1960  . STUMP REVISION Right 10/30/2017   Procedure: RIGHT BELOW KNEE AMPUTATION REVISION;  Surgeon: [name], MD;  Location: [place];  Service: Orthopedics;  Laterality: Right;    Social History   Socioeconomic History  . Marital status: Married    Spouse name: Not on file  . Number of children: 1  . Years of education: 16+  . Highest  education level: Not on file  Occupational History  . Occupation: Curator - [employer]    Employer: RETIRED  Social Needs  . Financial resource strain: Not on file  . Food insecurity:    Worry: Not on file    Inability: Not on file  . Transportation needs:    Medical: Not on file    Non-medical: Not on file  Tobacco Use  . Smoking status: Former Smoker    Years: 20.00    Last attempt to quit: 07/26/1981    Years since quitting: 36.8  . Smokeless tobacco: Never Used  Substance and Sexual Activity  . Alcohol use: Yes    Comment: occasional glass of wine  . Drug use: No  . Sexual activity: Never  Lifestyle  .  Physical activity:    Days per week: Not on file    Minutes per session: Not on file  . Stress: Not on file  Relationships  . Social connections:    Talks on phone: Not on file    Gets together: Not on file    Attends religious service: Not on file    Active member of club or organization: Not on file    Attends meetings of clubs or organizations: Not on file    Relationship status: Not on file  . Intimate partner violence:    Fear of current or ex partner: Not on file    Emotionally abused: Not on file    Physically abused: Not on file    Forced sexual activity: Not on file  Other Topics Concern  . Not on file  Social History Narrative   Lives with wife.   epworth sleepiness scale = 5 (01/20/16)    Allergies  Allergen Reactions  . Entresto [Sacubitril-Valsartan]     Intolerant due to frequent urination  . Lisinopril Cough    Family History  Problem Relation Age of Onset  . Stroke Father 73       Died age 60  . Coronary artery disease Brother 52  . Colon cancer Neg Hx   . Colon polyps Neg Hx   . Diabetes Neg Hx   . Esophageal cancer Neg Hx   . Kidney disease Neg Hx   . Gallbladder disease Neg Hx     Prior to Admission medications   Medication Sig Start Date End Date Taking? Authorizing Provider  aspirin EC 81 MG tablet Take 81 mg by mouth daily.   Yes [provider]  aspirin-sod bicarb-citric acid (ALKA-SELTZER) 325 MG TBEF tablet Take 650 mg by mouth daily as needed (heart burn).   Yes [provider]  carvedilol (COREG) 12.5 MG tablet TAKE 1 TABLET (12.5 MG TOTAL) BY MOUTH 2 (TWO) TIMES DAILY. 01/10/18 06/03/18 Yes Hilty, Nadean Corwin, MD  gabapentin (NEURONTIN) 100 MG capsule Take 1 capsule (100 mg total) by mouth 3 (three) times daily. Patient taking differently: Take 100 mg by mouth 2 (two) times daily.  08/15/17  Yes [name], MD  glucose blood (ACCU-CHEK AVIVA PLUS) test strip USE TO TEST DAILY AS INSTRUCTED PER MD**EMERGENCY FILL MEDICAL  REASON** 04/09/18  Yes Vivi Barrack, MD  halobetasol (ULTRAVATE) 0.05 % cream APPLY TO AFFECTED AREA TWICE A DAY Patient taking differently: Apply 1 application topically 2 (two) times daily.  03/10/18  Yes Vivi Barrack, MD  hydrocortisone cream 1 % Apply 1 application topically daily as needed for itching.   Yes [provider]  Lancets (ACCU-CHEK MULTICLIX) lancets  USE TO TEST ONCE DAILY 12/16/17  Yes Dorena Cookey, MD  loratadine (CLARITIN) 10 MG tablet Take 10 mg by mouth daily as needed for allergies.    Yes [provider]  LORazepam (ATIVAN) 0.5 MG tablet Take 1 tablet (0.5 mg total) by mouth every 8 (eight) hours as needed for anxiety. 11/08/17  Yes Nafziger, Tommi Rumps, NP  losartan (COZAAR) 50 MG tablet Take 1 tablet (50 mg total) by mouth daily. 05/15/18 08/13/18 Yes Hilty, Nadean Corwin, MD  metFORMIN (GLUCOPHAGE) 1000 MG tablet TAKE 1 TABLET (1,000 MG TOTAL) BY MOUTH 2 (TWO) TIMES DAILY. 11/01/17  Yes Dorena Cookey, MD  methocarbamol (ROBAXIN) 500 MG tablet TAKE 1 TABLET EVERY 6 HOURS AS NEEDED MUSCLE SPASMS Patient taking differently: Take 500 mg by mouth every 6 (six) hours as needed for muscle spasms.  09/12/17  Yes [name], MD  Multiple Vitamin (MULTIVITAMIN) tablet Take 1 tablet by mouth daily.   Yes [provider]  nystatin cream (MYCOSTATIN) APPLY TO AFFECTED AREA TWICE A DAY Patient taking differently: Apply 1 application topically 2 (two) times daily.  09/12/17  Yes Dorena Cookey, MD  potassium chloride SA (KLOR-CON M20) 20 MEQ tablet TAKE 2 TABLETS IN THE MORNING Patient taking differently: Take 80 mEq by mouth daily. TAKE 2 TABLETS IN THE MORNING 11/11/17  Yes Dorena Cookey, MD  tamsulosin (FLOMAX) 0.4 MG CAPS capsule TAKE 1 CAPSULE BY MOUTH 2 (TWO) TIMES DAILY. Patient taking differently: Take 0.4 mg by mouth daily.  04/07/18  Yes Hilty, Nadean Corwin, MD  Tetrahydrozoline HCl (VISINE OP) Place 1 drop into both eyes daily as needed (dry eyes).    Yes [provider]  traMADol (ULTRAM) 50 MG tablet Take 1 tablet (50 mg total) by mouth every 6 (six) hours as needed for moderate pain. 11/07/17  Yes [name], MD  TURMERIC PO Take 1 tablet by mouth daily as needed (FOR LEG CRAMPS).    Yes [provider]    Physical Exam: Vitals:   06/03/18 1100 06/03/18 1115 06/03/18 1130 06/03/18 1224  BP: (!) 166/87  (!) 157/100 (!) 159/89  Pulse: 78 77 79 78  Resp: (!) 23 18 18    Temp:      TempSrc:      SpO2: 96% 98% 96%   Weight:      Height:         General:  Appears calm and comfortable and is NAD Eyes:  PERRL, EOMI, normal lids, iris ENT:  grossly normal hearing, lips & tongue, mmm Neck:  no LAD, masses or thyromegaly; no carotid bruits Cardiovascular:  RRR, no m/r/g. No LE edema.  Respiratory:   CTA bilaterally with no wheezes/rales/rhonchi.  Normal respiratory effort. Abdomen:  soft, NT, ND, NABS Skin:  no rash or induration seen on limited exam Musculoskeletal:  S/p R BKA with clean stump Psychiatric:  grossly normal mood and affect, speech fluent and appropriate, AOx3 Neurologic:  CN 2-12 grossly intact, moves all extremities in coordinated fashion, sensation intact    Radiological Exams on Admission: Dg Chest Portable 1 View  Result Date: 06/03/2018 CLINICAL DATA:  Chest pain and syncope tonight EXAM: PORTABLE CHEST 1 VIEW COMPARISON:  12/09/2005 FINDINGS: Cardiomegaly with mild aortic atherosclerosis. No acute pulmonary consolidation or edema. The right lateral costophrenic angle is excluded. No pleural effusion or pneumothorax. No acute osseous abnormality. IMPRESSION: Cardiomegaly with mild aortic atherosclerosis. No active pulmonary disease. Electronically Signed   By: Meredith Leeds.D.  On: 06/03/2018 02:24    EKG: Independently reviewed.  NSR with rate 89; PVCs; nonspecific ST changes with no evidence of acute ischemia   Labs on Admission: I have personally reviewed the available labs and imaging  studies at the time of the admission.  Pertinent labs:   CO2 21 Glucose 132 Troponin 0.00, 0.07 Normal CBC  Assessment/Plan Principal Problem:   Syncope Active Problems:   Essential hypertension   Hyperglycemia   Chest pain   PVC (premature ventricular contraction)   Chronic systolic heart failure (HCC)   Syncope with chest pain -Patient with acute onset of sharp and severe CP along the left chest followed by syncopal episode -This is concerning for a malignant arrhythmia, particularly in light of frequent PVCs on EKG/telemetry  -Ischemic heart disease is a less likely possibility -He has not had an ischemic evaluation in quite some time -Will observe and consult cardiology for evaluation -Will leave NPO in case they plan to do a cardiac catheterization -Will monitor on telemetry -Orthostatic vital signs in AM -Trending troponins x3  -Neuro checks  -He may require EP consultation, pending results of cath -Continue ASA -Lipid panel pending  Chronic systolic heart failure -Echo in 4/19 showed worse biventricular function, with EF 40-45% and ungraded diastolic dysfunction -Repeat echo has been ordered by cardiology -He was recommended to consider starting Entresto but this was discontinued due to excessive urination HTN -Continue Coreg, Cozaar  DM -6/19 A1c was 5.9 -hold Glucophage -Cover with moderate-scale SSI   DVT prophylaxis:  Lovenox  Code Status:  Full - confirmed with patient Family Communication: None present  Disposition Plan:  Home once clinically improved Consults called: Cardiology  Admission status:  It is my clinical opinion that referral for OBSERVATION is reasonable and necessary in this patient based on the above information provided. The aforementioned taken together are felt to place the patient at high risk for further clinical deterioration. However it is anticipated that the patient may be medically stable for discharge from the hospital within  24 to 48 hours.    Karmen Bongo MD [facility]  If note is complete, please contact covering daytime or nighttime physician. [email or web address] Password [email or web address]  06/03/2018, 1:03 PM

## 2018-06-03 NOTE — ED Triage Notes (Signed)
BIB EMS from home. Reports sudden onset of L-sided CP that woke pt up from sleep. States pain was intense enough that he "may have blacked out." Denies other s/sx. Pt reports taking two 324mg  ASA tablets prior to EMS arrival. Refusing nitro from EMS. Pain decr'd upon arrival in ED to 3/10.

## 2018-06-03 NOTE — ED Notes (Signed)
Pt states he is hungry and request diet. Will inform provider.

## 2018-06-03 NOTE — Interval H&P Note (Signed)
Cath Lab Visit (complete for each Cath Lab visit)  Clinical Evaluation Leading to the Procedure:   ACS: No.  Non-ACS:    Anginal Classification: CCS III  Anti-ischemic medical therapy: Minimal Therapy (1 class of medications)  Non-Invasive Test Results: No non-invasive testing performed  Prior CABG: No previous CABG      History and Physical Interval Note:  06/03/2018 3:58 PM  [name]  has presented today for surgery, with the diagnosis of syncope - cp  The various methods of treatment have been discussed with the patient and family. After consideration of risks, benefits and other options for treatment, the patient has consented to  Procedure(s): LEFT HEART CATH AND CORONARY ANGIOGRAPHY (N/A) as a surgical intervention .  The patient's history has been reviewed, patient examined, no change in status, stable for surgery.  I have reviewed the patient's chart and labs.  Questions were answered to the patient's satisfaction.     Belva Crome III

## 2018-06-03 NOTE — ED Notes (Signed)
Dr. Lorin Mercy aware of pt request. Face to face.

## 2018-06-03 NOTE — ED Notes (Signed)
Pt c/o headache 2/10.

## 2018-06-03 NOTE — Consult Note (Addendum)
Cardiology Consultation:   Patient ID: [name]; [id number]; [birth year]   Admit date: 06/03/2018 Date of Consult: 06/03/2018  Primary Care Provider: Vivi Barrack, MD Primary Cardiologist: Pixie Casino, MD Primary Electrophysiologist:  None  Chief Complaint: severe chest pain then passed out  Patient Profile:   [name] is a 78 y.o. male with a hx of paroxysmal SVT, chronic combined CHF EF 40-45% felt due to NICM, PVCs, DM, diabetic wound s/p transtibial amputation of R leg, minimal CAD by cath 2008, HTN, rheumatic fever, ED who is being seen today for the evaluation of chest pain and syncope at the request of Dr. Lorin Mercy.  History of Present Illness:   Remote cath 2008 for chest pain showed 30% LAD with luminal irregularity and fairly heavy calcification in the mid LAD without critical stenosis, 30% OM1, 30% acute marginal, normal EF at that time. In 2017 an echo showed EF 45-50%. Holter 12/2015 showed NSR, occasional PVCs, sinus bradycardia and frequent sustained runs of SVT with rates in the 150s. Nuclear stress test 01/2016 showed no ischemia, not gated due to ectopy. It appears referral to EP was arranged but did not occur. His beta blocker was adjusted. In 08/2017, echo showed EF 40-45% with diffuse HK, mildly dilated aortic root, moderate LAE, mildly dilated RV with mid-moderate RV hypokinesis. He was trialed on Entresto but did not tolerate due to increased urinary frequency.  He was last seen 05/12/18 by Dr. Debara Pickett and felt to be doing well. Lisinopril 40mg  was changed to losartan 50mg  due to cough with plans for a repeat echo in 6 months.   He reports for years he's had brief intermittent sharp chest pains without particular provocation, worse at night. They last a second or two then go away without intervention. They are not worse with inspiration or palpation. He goes to the [facility] 3 days per week with light exercise on a recumbent bike with hand pedals and lifts some light  weights without recent exertional angina or dyspnea.  Last night around 11-12 he was lying in bed in his USOH when he suddenly developed left sided sharp chest pain. He reports it felt like someone hit him with a baseball bat. The very instant after he described a feeling of passing out like the "lights went out." He does not know how long he was out for but feels like he passed out for just a second. This was unwitnessed and occurred while lying in bed. He denies any preceding palpitations, nausea, vomiting, visual changes or diaphoresis. When he awoke he did not have any b/b incontinence or confusion. His sharp chest pain had resolved but he had mild residual L sided chest pressure for the next hour or two which was somewhat new. This gradually resolved without specific intervention but given the episode of syncope he presented to the ER for further eval. W/u reveals mild hyperglycemia of 132, troponins neg x 2, K 4.5, CBC wnl. CXR shows cardiomegaly with mild aortic atherosclerosis but no acute pulmonary disease. He is somewhat hypertensive in the ED with a peak of 174/103, most currently 154/97. Telemetry shows NSR with frequent PVCs. He is currently pain free, just hungry. He has mild LLE edema which he states is chronic appearing. He uses compression hose periodically.  Past Medical History:  Diagnosis Date  . CAD (coronary artery disease)    a. Remote cath 2008 for chest pain showed 30% LAD with luminal irregularity and fairly heavy calcification in the mid  LAD without critical stenosis, 30% OM1, 30% acute marginal.  . Cancer (HCC)    skin cancer - basal  . Cataract    both  . Chronic combined systolic and diastolic CHF (congestive heart failure) ([place])   . Complication of anesthesia   . Diabetes mellitus    Type II  . ED (erectile dysfunction)   . History of kidney stones    x1  . Hypertension   . NICM (nonischemic cardiomyopathy) ([place])    a. EF 40-45% by echo in 08/2017 - prior low risk  stress test in 2017.  . Osteoarthritis   . Paroxysmal SVT (supraventricular tachycardia) ([place])   . PONV (postoperative nausea and vomiting) 1960   with Ether  . PVC's (premature ventricular contractions)   . PVC's (premature ventricular contractions)   . PVD (peripheral vascular disease) ([place])    s/p R BKA  . Rheumatic fever   . Rhinitis   . S/P BKA (below knee amputation), right [facility])     Past Surgical History:  Procedure Laterality Date  . AMPUTATION Right 02/29/2016   Procedure: Right Leg AMPUTATION BELOW KNEE with Wound Vac placement;  Surgeon: [name], MD;  Location: [place];  Service: Orthopedics;  Laterality: Right;  . CHOLECYSTECTOMY  1983  . colonoscopy  2006, 2010  . EYE SURGERY Bilateral    cataract  . INGUINAL HERNIA REPAIR Right 1960  . STUMP REVISION Right 10/30/2017   Procedure: RIGHT BELOW KNEE AMPUTATION REVISION;  Surgeon: [name], MD;  Location: [place];  Service: Orthopedics;  Laterality: Right;     Inpatient Medications: Scheduled Meds: . aspirin EC  81 mg Oral Daily  . carvedilol  12.5 mg Oral BID  . enoxaparin (LOVENOX) injection  40 mg Subcutaneous Q24H  . gabapentin  100 mg Oral BID  . losartan  50 mg Oral Daily  . sodium chloride flush  3 mL Intravenous Q12H  . tamsulosin  0.4 mg Oral Daily   Continuous Infusions: . lactated ringers     PRN Meds: acetaminophen, LORazepam, methocarbamol, morphine injection, nitroGLYCERIN, ondansetron (ZOFRAN) IV, traMADol  Home Meds: Prior to Admission medications   Medication Sig Start Date End Date Taking? Authorizing Provider  aspirin EC 81 MG tablet Take 81 mg by mouth daily.   Yes [provider]  aspirin-sod bicarb-citric acid (ALKA-SELTZER) 325 MG TBEF tablet Take 650 mg by mouth daily as needed (heart burn).   Yes [provider]  carvedilol (COREG) 12.5 MG tablet TAKE 1 TABLET (12.5 MG TOTAL) BY MOUTH 2 (TWO) TIMES DAILY. 01/10/18 06/03/18 Yes Hilty, Nadean Corwin, MD  gabapentin  (NEURONTIN) 100 MG capsule Take 1 capsule (100 mg total) by mouth 3 (three) times daily. Patient taking differently: Take 100 mg by mouth 2 (two) times daily.  08/15/17  Yes [name], MD  glucose blood (ACCU-CHEK AVIVA PLUS) test strip USE TO TEST DAILY AS INSTRUCTED PER MD**EMERGENCY FILL MEDICAL REASON** 04/09/18  Yes Vivi Barrack, MD  halobetasol (ULTRAVATE) 0.05 % cream APPLY TO AFFECTED AREA TWICE A DAY Patient taking differently: Apply 1 application topically 2 (two) times daily.  03/10/18  Yes Vivi Barrack, MD  hydrocortisone cream 1 % Apply 1 application topically daily as needed for itching.   Yes [provider]  Lancets (ACCU-CHEK MULTICLIX) lancets USE TO TEST ONCE DAILY 12/16/17  Yes Dorena Cookey, MD  loratadine (CLARITIN) 10 MG tablet Take 10 mg by mouth daily as needed for allergies.  Yes [provider]  LORazepam (ATIVAN) 0.5 MG tablet Take 1 tablet (0.5 mg total) by mouth every 8 (eight) hours as needed for anxiety. 11/08/17  Yes Nafziger, Tommi Rumps, NP  losartan (COZAAR) 50 MG tablet Take 1 tablet (50 mg total) by mouth daily. 05/15/18 08/13/18 Yes Hilty, Nadean Corwin, MD  metFORMIN (GLUCOPHAGE) 1000 MG tablet TAKE 1 TABLET (1,000 MG TOTAL) BY MOUTH 2 (TWO) TIMES DAILY. 11/01/17  Yes Dorena Cookey, MD  methocarbamol (ROBAXIN) 500 MG tablet TAKE 1 TABLET EVERY 6 HOURS AS NEEDED MUSCLE SPASMS Patient taking differently: Take 500 mg by mouth every 6 (six) hours as needed for muscle spasms.  09/12/17  Yes [name], MD  Multiple Vitamin (MULTIVITAMIN) tablet Take 1 tablet by mouth daily.   Yes [provider]  nystatin cream (MYCOSTATIN) APPLY TO AFFECTED AREA TWICE A DAY Patient taking differently: Apply 1 application topically 2 (two) times daily.  09/12/17  Yes Dorena Cookey, MD  potassium chloride SA (KLOR-CON M20) 20 MEQ tablet TAKE 2 TABLETS IN THE MORNING Patient taking differently: Take 80 mEq by mouth daily. TAKE 2 TABLETS IN THE MORNING  11/11/17  Yes Dorena Cookey, MD  tamsulosin (FLOMAX) 0.4 MG CAPS capsule TAKE 1 CAPSULE BY MOUTH 2 (TWO) TIMES DAILY. Patient taking differently: Take 0.4 mg by mouth daily.  04/07/18  Yes Hilty, Nadean Corwin, MD  Tetrahydrozoline HCl (VISINE OP) Place 1 drop into both eyes daily as needed (dry eyes).   Yes [provider]  traMADol (ULTRAM) 50 MG tablet Take 1 tablet (50 mg total) by mouth every 6 (six) hours as needed for moderate pain. 11/07/17  Yes [name], MD  TURMERIC PO Take 1 tablet by mouth daily as needed (FOR LEG CRAMPS).    Yes [provider]    Allergies:    Allergies  Allergen Reactions  . Entresto [Sacubitril-Valsartan]     Intolerant due to frequent urination  . Lisinopril Cough    Social History:   Social History   Socioeconomic History  . Marital status: Married    Spouse name: Not on file  . Number of children: 1  . Years of education: 16+  . Highest education level: Not on file  Occupational History  . Occupation: Curator - [employer]    Employer: RETIRED  Social Needs  . Financial resource strain: Not on file  . Food insecurity:    Worry: Not on file    Inability: Not on file  . Transportation needs:    Medical: Not on file    Non-medical: Not on file  Tobacco Use  . Smoking status: Former Smoker    Years: 20.00    Last attempt to quit: 07/26/1981    Years since quitting: 36.8  . Smokeless tobacco: Never Used  Substance and Sexual Activity  . Alcohol use: Yes    Comment: occasional glass of wine  . Drug use: No  . Sexual activity: Never  Lifestyle  . Physical activity:    Days per week: Not on file    Minutes per session: Not on file  . Stress: Not on file  Relationships  . Social connections:    Talks on phone: Not on file    Gets together: Not on file    Attends religious service: Not on file    Active member of club or organization: Not on file    Attends meetings of clubs or organizations: Not  on file  Relationship status: Not on file  . Intimate partner violence:    Fear of current or ex partner: Not on file    Emotionally abused: Not on file    Physically abused: Not on file    Forced sexual activity: Not on file  Other Topics Concern  . Not on file  Social History Narrative   Lives with wife.   epworth sleepiness scale = 5 (01/20/16)    Family History:   The patient's family history includes Coronary artery disease (age of onset: 65) in his brother; Stroke (age of onset: 9) in his father. There is no history of Colon cancer, Colon polyps, Diabetes, Esophageal cancer, Kidney disease, or Gallbladder disease.  ROS:  Please see the history of present illness.  All other ROS reviewed and negative.     Physical Exam/Data:   Vitals:   06/03/18 0830 06/03/18 0845 06/03/18 0900 06/03/18 1000  BP: (!) 174/103  (!) 165/143 (!) 154/97  Pulse: 63 77 77 81  Resp: 18 (!) 22 20 (!) 23  SpO2: 95% 98% 96% 95%  Weight:      Height:       No intake or output data in the 24 hours ending 06/03/18 1036 Filed Weights   06/03/18 0119  Weight: 95.3 kg   Body mass index is 28.48 kg/m.  General: Well developed, well nourished, in no acute distress. Head: Normocephalic, atraumatic, sclera non-icteric, no xanthomas, nares are without discharge.  Neck: Negative for carotid bruits. JVD not elevated. Lungs: Clear bilaterally to auscultation without wheezes, rales, or rhonchi. Breathing is unlabored. Heart: RRR with S1 S2. No murmurs, rubs, or gallops appreciated. Abdomen: Soft, non-tender, non-distended with normoactive bowel sounds. No hepatomegaly. No rebound/guarding. No obvious abdominal masses. Msk:  Strength and tone appear normal for age. Extremities: No clubbing or cyanosis. No edema.  Distal pedal pulses are 2+ and equal bilaterally. Neuro: Alert and oriented X 3. No facial asymmetry. No focal deficit. Moves all extremities spontaneously. Psych:  Responds to questions  appropriately with a normal affect.  EKG:  The EKG was personally reviewed and demonstrates NSR 89bpm, occasional PVCs including one couplet, left atrial enlargement, nonspecific ST-T changes, QTc 46ms  Laboratory Data:  Chemistry Recent Labs  Lab 06/03/18 0134  NA 139  K 4.5  CL 106  CO2 21*  GLUCOSE 132*  BUN 18  CREATININE 0.86  CALCIUM 9.3  GFRNONAA >60  GFRAA >60  ANIONGAP 12    No results for input(s): PROT, ALBUMIN, AST, ALT, ALKPHOS, BILITOT in the last 168 hours. Hematology Recent Labs  Lab 06/03/18 0134  WBC 7.7  RBC 4.64  HGB 13.8  HCT 41.6  MCV 89.7  MCH 29.7  MCHC 33.2  RDW 13.1  PLT 236   Cardiac EnzymesNo results for input(s): TROPONINI in the last 168 hours.  Recent Labs  Lab 06/03/18 0136 06/03/18 0418  TROPIPOC 0.00 0.07    BNPNo results for input(s): BNP, PROBNP in the last 168 hours.  DDimer No results for input(s): DDIMER in the last 168 hours.  Radiology/Studies:  Dg Chest Portable 1 View  Result Date: 06/03/2018 CLINICAL DATA:  Chest pain and syncope tonight EXAM: PORTABLE CHEST 1 VIEW COMPARISON:  12/09/2005 FINDINGS: Cardiomegaly with mild aortic atherosclerosis. No acute pulmonary consolidation or edema. The right lateral costophrenic angle is excluded. No pleural effusion or pneumothorax. No acute osseous abnormality. IMPRESSION: Cardiomegaly with mild aortic atherosclerosis. No active pulmonary disease. Electronically Signed   By: Meredith Leeds.D.  On: 06/03/2018 02:24    Assessment and Plan:   1. Syncope and chest pain - etiology of syncope not totally clear - question vagal, precipitated by acute chest pain, but the "lights out" description is somewhat concerning given his history of LV dysfunction as well as PVCs. Continue telemetry and obtain echocardiogram. Discussed avoidance of driving until cleared by cardiologist as outpatient. His CP is somewhat atypical but certainly concerning given his presentation with syncope. As a  diabetic, he may not have typical symptoms. His last cath was in 2008 with mild CAD. Telemetry shows frequent PVCs which patient was previously known to have. Troponins negative x 2. These will continue to be cycled. Added Mag level to labs. Per discussion with MD, plan cardiac cath to definitively exclude CAD given his declining EF the last few years and presenting symptoms. Will likely need event monitoring as OP.  2. History of NICM/chronic combined CHF - appears mildly volume overloaded on exam with peripheral edema. He was not on standing diuretic as outpatient. He did not previously tolerate Entresto due to frequent urination so would need to be cautious so as not to discourage patient - spironolactone would be a consideration given h/o decreased EF. Agree with echocardiogram.  3. Essential HTN - BP elevated in ED but was relatively controlled as OP recently. Follow on home regimen.  4. PSVT - not seen on tele thus far, continue to monitor. TSH pending.  5. DM - per IM. Metformin on hold.  For questions or updates, please contact [place] Please consult [email or web address] for contact info under Cardiology/STEMI.    Signed, Charlie Pitter, PA-C  06/03/2018 10:36 AM

## 2018-06-03 NOTE — Plan of Care (Signed)
  Problem: Education: Goal: Knowledge of General Education information will improve Description Including pain rating scale, medication(s)/side effects and non-pharmacologic comfort measures Outcome: Progressing   Problem: Education: Goal: Understanding of CV disease, CV risk reduction, and recovery process will improve Outcome: Progressing Goal: Individualized Educational Video(s) Outcome: Progressing   Problem: Activity: Goal: Ability to return to baseline activity level will improve Outcome: Progressing   Problem: Cardiovascular: Goal: Ability to achieve and maintain adequate cardiovascular perfusion will improve Outcome: Progressing Goal: Vascular access site(s) Level 0-1 will be maintained Outcome: Progressing   Problem: Health Behavior/Discharge Planning: Goal: Ability to safely manage health-related needs after discharge will improve Outcome: Progressing   Problem: Education: Goal: Knowledge of General Education information will improve Description Including pain rating scale, medication(s)/side effects and non-pharmacologic comfort measures Outcome: Progressing   Problem: Health Behavior/Discharge Planning: Goal: Ability to manage health-related needs will improve Outcome: Progressing   Problem: Clinical Measurements: Goal: Ability to maintain clinical measurements within normal limits will improve Outcome: Progressing Goal: Will remain free from infection Outcome: Progressing Goal: Diagnostic test results will improve Outcome: Progressing Goal: Respiratory complications will improve Outcome: Progressing Goal: Cardiovascular complication will be avoided Outcome: Progressing   Problem: Nutrition: Goal: Adequate nutrition will be maintained Outcome: Progressing   Problem: Coping: Goal: Level of anxiety will decrease Outcome: Progressing   Problem: Elimination: Goal: Will not experience complications related to bowel motility Outcome: Progressing Goal:  Will not experience complications related to urinary retention Outcome: Progressing   Problem: Pain Managment: Goal: General experience of comfort will improve Outcome: Progressing   Problem: Safety: Goal: Ability to remain free from injury will improve Outcome: Progressing   Problem: Skin Integrity: Goal: Risk for impaired skin integrity will decrease Outcome: Progressing

## 2018-06-03 NOTE — ED Provider Notes (Signed)
[facility] EMERGENCY DEPARTMENT Provider Note   CSN: [id number] Arrival date & time: 06/03/18  0111     History   Chief Complaint No chief complaint on file.   HPI [name] is a 78 y.o. male.  The history is provided by the patient.  He has history of diabetes, hypertension, systolic heart failure, SVT and comes in following a syncopal episode.  He states that he had a sudden, sharp pain in the left anterolateral chest and then passed out.  He is not sure how long he was unconscious, but thinks it was a short period of time.  Since then, chest pain has waxed and waned.  Initial pain was 10/10, since waking up, pain has varied between 2/10 and 4/10.  He denies dyspnea, nausea, diaphoresis.  He has never had an episode like this before.  He did take aspirin 650 mg at home.  He was brought in by ambulance with no further treatment.   Past Medical History:  Diagnosis Date  . CAD (coronary artery disease)    Minimal plaque cath 2008  . Cancer ([place])    skin cancer - basal  . Cataract    both  . Complication of anesthesia   . Diabetes mellitus    Type II  . ED (erectile dysfunction)   . History of kidney stones    x1  . Hypertension   . Osteoarthritis   . PONV (postoperative nausea and vomiting) 1960   with Ether  . PVC's (premature ventricular contractions)   . Rheumatic fever   . Rhinitis     Patient Active Problem List   Diagnosis Date Noted  . Dermatitis 03/10/2018  . Benign prostatic hyperplasia with nocturia 11/12/2017  . Below knee amputation status, right 10/30/2017  . Dehiscence of amputation stump ([place])   . Nonischemic cardiomyopathy ([place]) 09/03/2017  . AVNRT (AV nodal re-entry tachycardia) ([place]) 01/30/2017  . Chronic midline low back pain without sciatica 01/03/2017  . H/O amputation of leg through tibia and fibula, right ([place]) 05/03/2016  . Phantom limb pain ([place]) 03/06/2016  . SVT (supraventricular tachycardia) ([place]) 01/20/2016  .  Chronic systolic heart failure ([place]) 01/20/2016  . Anxiety state 10/26/2013  . PVC (premature ventricular contraction) 07/27/2011  . Hyperglycemia 11/02/2010  . BASAL CELL CARCINOMA, FACE 10/18/2008  . BASAL CELL CARCINOMA, FACE 10/18/2008  . LIPOMA, SKIN 05/31/2008  . DRY SKIN 05/31/2008  . ERECTILE DYSFUNCTION 09/30/2007  . OSTEOARTHRITIS 08/08/2007  . Essential hypertension 03/24/2007    Past Surgical History:  Procedure Laterality Date  . AMPUTATION Right 02/29/2016   Procedure: Right Leg AMPUTATION BELOW KNEE with Wound Vac placement;  Surgeon: [name], MD;  Location: [facility];  Service: Orthopedics;  Laterality: Right;  . CHOLECYSTECTOMY  1983  . colonoscopy  2006, 2010  . EYE SURGERY Bilateral    cataract  . INGUINAL HERNIA REPAIR Right 1960  . STUMP REVISION Right 10/30/2017   Procedure: RIGHT BELOW KNEE AMPUTATION REVISION;  Surgeon: [name], MD;  Location: [facility];  Service: Orthopedics;  Laterality: Right;        Home Medications    Prior to Admission medications   Medication Sig Start Date End Date Taking? Authorizing Provider  aspirin EC 81 MG tablet Take 81 mg by mouth daily.    [provider]  aspirin-sod bicarb-citric acid (ALKA-SELTZER) 325 MG TBEF tablet Take 650 mg by mouth daily as needed (heart burn).    [provider]  carvedilol (COREG) 12.5 MG tablet TAKE 1 TABLET (12.5 MG TOTAL) BY MOUTH 2 (TWO) TIMES DAILY. 01/10/18 05/12/18  Pixie Casino, MD  gabapentin (NEURONTIN) 100 MG capsule Take 1 capsule (100 mg total) by mouth 3 (three) times daily. Patient taking differently: Take 100 mg by mouth 3 (three) times daily as needed (pain).  08/15/17   [name], MD  glucose blood (ACCU-CHEK AVIVA PLUS) test strip USE TO TEST DAILY AS INSTRUCTED PER MD**EMERGENCY FILL MEDICAL REASON** 04/09/18   Vivi Barrack, MD  halobetasol (ULTRAVATE) 0.05 % cream APPLY TO AFFECTED AREA TWICE A DAY 03/10/18   Vivi Barrack, MD    hydrocortisone cream 1 % Apply 1 application topically daily as needed for itching.    [provider]  Lancets (ACCU-CHEK MULTICLIX) lancets USE TO TEST ONCE DAILY 12/16/17   Dorena Cookey, MD  loratadine (CLARITIN) 10 MG tablet Take 10 mg by mouth daily.     [provider]  LORazepam (ATIVAN) 0.5 MG tablet Take 1 tablet (0.5 mg total) by mouth every 8 (eight) hours as needed for anxiety. 11/08/17   Nafziger, Tommi Rumps, NP  losartan (COZAAR) 50 MG tablet Take 1 tablet (50 mg total) by mouth daily. 05/15/18 08/13/18  Hilty, Nadean Corwin, MD  metFORMIN (GLUCOPHAGE) 1000 MG tablet TAKE 1 TABLET (1,000 MG TOTAL) BY MOUTH 2 (TWO) TIMES DAILY. 11/01/17   Dorena Cookey, MD  methocarbamol (ROBAXIN) 500 MG tablet TAKE 1 TABLET EVERY 6 HOURS AS NEEDED MUSCLE SPASMS 09/12/17   [name], MD  Multiple Vitamin (MULTIVITAMIN) tablet Take 1 tablet by mouth daily.    [provider]  nystatin cream (MYCOSTATIN) APPLY TO AFFECTED AREA TWICE A DAY 09/12/17   Dorena Cookey, MD  potassium chloride SA (KLOR-CON M20) 20 MEQ tablet TAKE 2 TABLETS IN THE MORNING 11/11/17   Dorena Cookey, MD  tamsulosin (FLOMAX) 0.4 MG CAPS capsule TAKE 1 CAPSULE BY MOUTH 2 (TWO) TIMES DAILY. 04/07/18   Hilty, Nadean Corwin, MD  Tetrahydrozoline HCl (VISINE OP) Place 1 drop into both eyes daily as needed (dry eyes).    [provider]  traMADol (ULTRAM) 50 MG tablet Take 1 tablet (50 mg total) by mouth every 6 (six) hours as needed for moderate pain. 11/07/17   [name], MD  TURMERIC PO Take 1 tablet by mouth daily as needed (FOR LEG CRAMPS).     [provider]    Family History Family History  Problem Relation Age of Onset  . Stroke Father 12       Died age 11  . Coronary artery disease Brother 68  . Colon cancer Neg Hx   . Colon polyps Neg Hx   . Diabetes Neg Hx   . Esophageal cancer Neg Hx   . Kidney disease Neg Hx   . Gallbladder disease Neg Hx     Social History Social  History   Tobacco Use  . Smoking status: Former Smoker    Years: 20.00    Last attempt to quit: 07/26/1981    Years since quitting: 36.8  . Smokeless tobacco: Never Used  Substance Use Topics  . Alcohol use: Not Currently  . Drug use: No     Allergies   Lisinopril   Review of Systems Review of Systems  All other systems reviewed and are negative.    Physical Exam Updated Vital Signs BP (!) 151/80   Pulse 72   Resp 20   Ht  6' (1.829 m)   Wt 95.3 kg   SpO2 97%   BMI 28.48 kg/m   Physical Exam Vitals signs and nursing note reviewed.    78 year old male, resting comfortably and in no acute distress. Vital signs are significant for elevated blood pressure. Oxygen saturation is 96%, which is normal. Head is normocephalic and atraumatic. PERRLA, EOMI. Oropharynx is clear. Neck is nontender and supple without adenopathy or JVD. Back is nontender and there is no CVA tenderness. Lungs are clear without rales, wheezes, or rhonchi. Chest is nontender. Heart has regular rate and rhythm without murmur. Abdomen is soft, flat, nontender without masses or hepatosplenomegaly and peristalsis is normoactive. Extremities right below the knee amputation.  1+ pretibial edema on the left. Skin is warm and dry without rash. Neurologic: Mental status is normal, cranial nerves are intact, there are no motor or sensory deficits.  ED Treatments / Results  Labs (all labs ordered are listed, but only abnormal results are displayed) Labs Reviewed  BASIC METABOLIC PANEL - Abnormal; Notable for the following components:      Result Value   CO2 21 (*)    Glucose, Bld 132 (*)    All other components within normal limits  CBG MONITORING, ED - Abnormal; Notable for the following components:   Glucose-Capillary 115 (*)    All other components within normal limits  CBC  I-STAT TROPONIN, ED  I-STAT TROPONIN, ED    EKG EKG Interpretation  Date/Time:  Tuesday June 03 2018 01:19:16  EST Ventricular Rate:  89 PR Interval:    QRS Duration: 112 QT Interval:  380 QTC Calculation: 430 R Axis:   8 Text Interpretation:  Sinus rhythm Paired ventricular premature complexes Left atrial enlargement Borderline intraventricular conduction delay Abnormal R-wave progression, early transition When compared with ECG of 12/10/2015, Premature ventricular complexes are now present Confirmed by Delora Fuel ([id number]) on 06/03/2018 1:24:53 AM   Radiology Dg Chest Portable 1 View  Result Date: 06/03/2018 CLINICAL DATA:  Chest pain and syncope tonight EXAM: PORTABLE CHEST 1 VIEW COMPARISON:  12/09/2005 FINDINGS: Cardiomegaly with mild aortic atherosclerosis. No acute pulmonary consolidation or edema. The right lateral costophrenic angle is excluded. No pleural effusion or pneumothorax. No acute osseous abnormality. IMPRESSION: Cardiomegaly with mild aortic atherosclerosis. No active pulmonary disease. Electronically Signed   By: Ashley Royalty M.D.   On: 06/03/2018 02:24    Procedures Procedures   Medications Ordered in ED Medications  nitroGLYCERIN (NITROSTAT) SL tablet 0.4 mg (has no administration in time range)     Initial Impression / Assessment and Plan / ED Course  I have reviewed the triage vital signs and the nursing notes.  Pertinent labs & imaging results that were available during my care of the patient were reviewed by me and considered in my medical decision making (see chart for details).  Chest pain and syncope.  ECG does show frequent PVCs but no acute ST or T changes.  Old records are reviewed, and during recent hospitalization, he was noted to have PVCs and runs of supraventricular tachycardia.  Cardiology had done an outpatient cardiac event monitor and also noted episodes of SVT.  Apparently, he does have history of minor coronary artery disease noted at a catheterization in 2008.  Nuclear stress test in September 2017 was low risk.  Will check electrolytes, troponin.  He  would be given nitroglycerin for ongoing pain.  He will need to be admitted for evaluation of his syncope.  I am very concerned  that it may be related to his known arrhythmias.  Labs are unremarkable including normal troponin.  Chest x-ray shows no acute process.  Case is discussed with Dr. Hal Hope tried hospitalist, who agrees to admit the patient.  Final Clinical Impressions(s) / ED Diagnoses   Final diagnoses:  Syncope, unspecified syncope type  Chest pain, unspecified type  PVC's (premature ventricular contractions)    ED Discharge Orders    None       Delora Fuel, MD 34/96/11 [phone]

## 2018-06-04 ENCOUNTER — Encounter (HOSPITAL_COMMUNITY): Payer: Self-pay | Admitting: Interventional Cardiology

## 2018-06-04 ENCOUNTER — Other Ambulatory Visit (HOSPITAL_COMMUNITY): Payer: Self-pay

## 2018-06-04 ENCOUNTER — Other Ambulatory Visit: Payer: Self-pay | Admitting: Physician Assistant

## 2018-06-04 DIAGNOSIS — I10 Essential (primary) hypertension: Secondary | ICD-10-CM | POA: Diagnosis not present

## 2018-06-04 DIAGNOSIS — E1151 Type 2 diabetes mellitus with diabetic peripheral angiopathy without gangrene: Secondary | ICD-10-CM | POA: Diagnosis not present

## 2018-06-04 DIAGNOSIS — I25119 Atherosclerotic heart disease of native coronary artery with unspecified angina pectoris: Secondary | ICD-10-CM | POA: Diagnosis not present

## 2018-06-04 DIAGNOSIS — I5042 Chronic combined systolic (congestive) and diastolic (congestive) heart failure: Secondary | ICD-10-CM | POA: Diagnosis not present

## 2018-06-04 DIAGNOSIS — I493 Ventricular premature depolarization: Secondary | ICD-10-CM | POA: Diagnosis not present

## 2018-06-04 DIAGNOSIS — I428 Other cardiomyopathies: Secondary | ICD-10-CM | POA: Diagnosis not present

## 2018-06-04 DIAGNOSIS — Z87891 Personal history of nicotine dependence: Secondary | ICD-10-CM | POA: Diagnosis not present

## 2018-06-04 DIAGNOSIS — R55 Syncope and collapse: Secondary | ICD-10-CM | POA: Diagnosis not present

## 2018-06-04 DIAGNOSIS — I110 Hypertensive heart disease with heart failure: Secondary | ICD-10-CM | POA: Diagnosis not present

## 2018-06-04 DIAGNOSIS — R0789 Other chest pain: Secondary | ICD-10-CM | POA: Diagnosis not present

## 2018-06-04 DIAGNOSIS — E1165 Type 2 diabetes mellitus with hyperglycemia: Secondary | ICD-10-CM | POA: Diagnosis not present

## 2018-06-04 DIAGNOSIS — E785 Hyperlipidemia, unspecified: Secondary | ICD-10-CM | POA: Diagnosis not present

## 2018-06-04 DIAGNOSIS — R739 Hyperglycemia, unspecified: Secondary | ICD-10-CM | POA: Diagnosis not present

## 2018-06-04 DIAGNOSIS — I5022 Chronic systolic (congestive) heart failure: Secondary | ICD-10-CM | POA: Diagnosis not present

## 2018-06-04 DIAGNOSIS — M1990 Unspecified osteoarthritis, unspecified site: Secondary | ICD-10-CM | POA: Diagnosis not present

## 2018-06-04 DIAGNOSIS — I471 Supraventricular tachycardia: Secondary | ICD-10-CM | POA: Diagnosis not present

## 2018-06-04 LAB — BASIC METABOLIC PANEL
Anion gap: 8 (ref 5–15)
BUN: 14 mg/dL (ref 8–23)
CO2: 25 mmol/L (ref 22–32)
Calcium: 8.7 mg/dL — ABNORMAL LOW (ref 8.9–10.3)
Chloride: 105 mmol/L (ref 98–111)
Creatinine, Ser: 0.76 mg/dL (ref 0.61–1.24)
GFR calc Af Amer: >60 mL/min (ref >60)
GFR calc non Af Amer: >60 mL/min (ref >60)
Glucose, Bld: 115 mg/dL — ABNORMAL HIGH (ref 70–99)
Potassium: 3.8 mmol/L (ref 3.5–5.1)
Sodium: 138 mmol/L (ref 135–145)

## 2018-06-04 LAB — CBC
HCT: 40.9 % (ref 39.0–52.0)
Hemoglobin: 13.6 g/dL (ref 13.0–17.0)
MCH: 29.5 pg (ref 26.0–34.0)
MCHC: 33.3 g/dL (ref 30.0–36.0)
MCV: 88.7 fL (ref 80.0–100.0)
Platelets: 224 10*3/uL (ref 150–400)
RBC: 4.61 MIL/uL (ref 4.22–5.81)
RDW: 13.2 % (ref 11.5–15.5)
WBC: 7 10*3/uL (ref 4.0–10.5)
nRBC: 0 % (ref 0.0–0.2)

## 2018-06-04 LAB — GLUCOSE, CAPILLARY: Glucose-Capillary: 110 mg/dL — ABNORMAL HIGH (ref 70–99)

## 2018-06-04 LAB — LIPID PANEL
Cholesterol: 130 mg/dL (ref 0–200)
HDL: 28 mg/dL — ABNORMAL LOW (ref >40)
LDL Cholesterol: 76 mg/dL (ref 0–99)
Total CHOL/HDL Ratio: 4.6 RATIO
Triglycerides: 130 mg/dL (ref <150)
VLDL: 26 mg/dL (ref 0–40)

## 2018-06-04 MED ORDER — CARVEDILOL 25 MG PO TABS: 25.0000 mg | ORAL_TABLET | Freq: Two times a day (BID) | ORAL | 0 refills | Status: DC

## 2018-06-04 MED ORDER — CARVEDILOL 25 MG PO TABS
25.0000 mg | ORAL_TABLET | Freq: Two times a day (BID) | ORAL | Status: DC
  Administered 2018-06-04: 25 mg via ORAL
  Filled 2018-06-04: qty 1

## 2018-06-04 NOTE — Progress Notes (Signed)
I have sent a message to our office to arrange 2 week monitor and f/u with Dr. Debara Pickett in 4-6 weeks - monitor has to be pre-certed through insurance and scheduled so patient will be called with further pickup information. I also confirmed with Dr. Debara Pickett patient does not need echo while inpatient. He will reassess at time of follow-up. I spoke with Alinda Sierras, Financial controller, to call echo dept to make them aware. Delmore Sear PA-C

## 2018-06-04 NOTE — Discharge Instructions (Signed)
Post Cath Instructions: No driving until cleared by your cardiologist. No lifting over 5 lbs for 1 week. No sexual activity for 1 week. Keep procedure site clean & dry. If you notice increased pain, swelling, bleeding or pus, call/return!  You may shower, but no soaking baths/hot tubs/pools for 1 week.

## 2018-06-04 NOTE — Progress Notes (Signed)
DAILY PROGRESS NOTE   Patient Name: [name] Date of Encounter: 06/04/2018  Chief Complaint   No complaints overnight.  Patient Profile   [name] is a 78 y.o. male with a hx of paroxysmal SVT, chronic combined CHF EF 40-45% felt due to NICM, PVCs, DM, diabetic wound s/p transtibial amputation of R leg, minimal CAD by cath 2008, HTN, rheumatic fever, ED who is being seen today for the evaluation of chest pain and syncope at the request of Dr. Lorin Mercy.  Subjective   Cath yesterday showed moderate non-obstructive CAD - symptoms may be due to PSVT or PVC's, predominantly a NICM. Feels well today - but having some urinary retention.  Objective   Vitals:   06/04/18 0425 06/04/18 0622 06/04/18 0625 06/04/18 0749  BP: 129/76 121/83 135/90 (!) 148/94  Pulse: 72 62 71 71  Resp: 18 18 18 20   Temp: 98.2 F (36.8 C)  97.8 F (36.6 C) 98.8 F (37.1 C)  TempSrc: Oral  Oral Oral  SpO2: 97%  94% 95%  Weight:   97.4 kg   Height:        Intake/Output Summary (Last 24 hours) at 06/04/2018 0817 Last data filed at 06/04/2018 0425 Gross per 24 hour  Intake 369 ml  Output 250 ml  Net 119 ml   Filed Weights   06/03/18 0119 06/03/18 1430 06/04/18 0625  Weight: 95.3 kg 96.3 kg 97.4 kg    Physical Exam   General appearance: alert and no distress Lungs: clear to auscultation bilaterally Heart: regular rate and rhythm Extremities: extremities normal, atraumatic, no cyanosis or edema and right radial cath site intact, no ecchymosis, hematoma or bruit Neurologic: Grossly normal  Inpatient Medications    Scheduled Meds: . aspirin  81 mg Oral Daily  . carvedilol  12.5 mg Oral BID  . enoxaparin (LOVENOX) injection  40 mg Subcutaneous Q24H  . gabapentin  100 mg Oral BID  . insulin aspart  0-15 Units Subcutaneous TID WC  . losartan  50 mg Oral Daily  . sodium chloride flush  3 mL Intravenous Q12H  . sodium chloride flush  3 mL Intravenous Q12H  . tamsulosin  0.4 mg Oral Daily     Continuous Infusions: . sodium chloride      PRN Meds: sodium chloride, acetaminophen, LORazepam, methocarbamol, morphine injection, nitroGLYCERIN, ondansetron (ZOFRAN) IV, oxyCODONE, sodium chloride flush, traMADol   Labs   Results for orders placed or performed during the hospital encounter of 06/03/18 (from the past 48 hour(s))  Basic metabolic panel     Status: Abnormal   Collection Time: 06/03/18  1:34 AM  Result Value Ref Range   Sodium 139 135 - 145 mmol/L   Potassium 4.5 3.5 - 5.1 mmol/L   Chloride 106 98 - 111 mmol/L   CO2 21 (L) 22 - 32 mmol/L   Glucose, Bld 132 (H) 70 - 99 mg/dL   BUN 18 8 - 23 mg/dL   Creatinine, Ser 0.86 0.61 - 1.24 mg/dL   Calcium 9.3 8.9 - 10.3 mg/dL   GFR calc non Af Amer >60 >60 mL/min   GFR calc Af Amer >60 >60 mL/min   Anion gap 12 5 - 15    Comment: Performed at [facility], [place] [address] [place],  [address]  CBC     Status: None   Collection Time: 06/03/18  1:34 AM  Result Value Ref Range   WBC 7.7 4.0 - 10.5 K/uL   RBC  4.64 4.22 - 5.81 MIL/uL   Hemoglobin 13.8 13.0 - 17.0 g/dL   HCT 41.6 39.0 - 52.0 %   MCV 89.7 80.0 - 100.0 fL   MCH 29.7 26.0 - 34.0 pg   MCHC 33.2 30.0 - 36.0 g/dL   RDW 13.1 11.5 - 15.5 %   Platelets 236 150 - 400 K/uL   nRBC 0.0 0.0 - 0.2 %    Comment: Performed at [facility], [place] [address], [place], [place] [address]  I-stat troponin, ED     Status: None   Collection Time: 06/03/18  1:36 AM  Result Value Ref Range   Troponin i, poc 0.00 0.00 - 0.08 ng/mL   Comment 3            Comment: Due to the release kinetics of cTnI, a negative result within the first hours of the onset of symptoms does not rule out myocardial infarction with certainty. If myocardial infarction is still suspected, repeat the test at appropriate intervals.   CBG monitoring, ED     Status: Abnormal   Collection Time: 06/03/18  2:27 AM  Result Value Ref Range   Glucose-Capillary 115 (H) 70 - 99 mg/dL   I-stat troponin, ED (0, 3)     Status: None   Collection Time: 06/03/18  4:18 AM  Result Value Ref Range   Troponin i, poc 0.07 0.00 - 0.08 ng/mL   Comment 3            Comment: Due to the release kinetics of cTnI, a negative result within the first hours of the onset of symptoms does not rule out myocardial infarction with certainty. If myocardial infarction is still suspected, repeat the test at appropriate intervals.   Troponin I - Now Then Q6H     Status: None   Collection Time: 06/03/18 10:38 AM  Result Value Ref Range   Troponin I <0.03 <0.03 ng/mL    Comment: Performed at [facility] [address] [place], [place] [address]  Magnesium     Status: None   Collection Time: 06/03/18 10:38 AM  Result Value Ref Range   Magnesium 1.8 1.7 - 2.4 mg/dL    Comment: Performed at [facility] [address], [place], [place] [address]  CBG monitoring, ED     Status: Abnormal   Collection Time: 06/03/18  2:14 PM  Result Value Ref Range   Glucose-Capillary 103 (H) 70 - 99 mg/dL   Comment 1 Notify RN    Comment 2 Document in Chart   Troponin I - Now Then Q6H     Status: None   Collection Time: 06/03/18  3:33 PM  Result Value Ref Range   Troponin I <0.03 <0.03 ng/mL    Comment: Performed at [facility], [place] [address] [place], [place] [address]  TSH     Status: None   Collection Time: 06/03/18  3:33 PM  Result Value Ref Range   TSH 1.840 0.350 - 4.500 uIU/mL    Comment: Performed by a 3rd Generation assay with a functional sensitivity of <=0.01 uIU/mL. Performed at [facility], [place] [address], [place],  [address]   Glucose, capillary     Status: Abnormal   Collection Time: 06/03/18  5:35 PM  Result Value Ref Range   Glucose-Capillary 112 (H) 70 - 99 mg/dL  Glucose, capillary     Status: Abnormal   Collection Time: 06/03/18  9:11 PM  Result  Value Ref Range   Glucose-Capillary 171 (H) 70 - 99 mg/dL  Troponin I - Now Then Q6H     Status:  None   Collection Time: 06/03/18  9:52 PM  Result Value Ref Range   Troponin I <0.03 <0.03 ng/mL    Comment: Performed at [facility] [address], [place] [address]  CBC     Status: None   Collection Time: 06/04/18  6:51 AM  Result Value Ref Range   WBC 7.0 4.0 - 10.5 K/uL   RBC 4.61 4.22 - 5.81 MIL/uL   Hemoglobin 13.6 13.0 - 17.0 g/dL   HCT 40.9 39.0 - 52.0 %   MCV 88.7 80.0 - 100.0 fL   MCH 29.5 26.0 - 34.0 pg   MCHC 33.3 30.0 - 36.0 g/dL   RDW 13.2 11.5 - 15.5 %   Platelets 224 150 - 400 K/uL   nRBC 0.0 0.0 - 0.2 %    Comment: Performed at [facility], [place] [address] [place], [place] [address]  Glucose, capillary     Status: Abnormal   Collection Time: 06/04/18  7:21 AM  Result Value Ref Range   Glucose-Capillary 110 (H) 70 - 99 mg/dL   Comment 1 Notify RN    Comment 2 Document in Chart     ECG   Sinus with frequent PVC's at 1- Personally Reviewed  Telemetry   Sinus rhythm - Personally Reviewed  Radiology    Dg Chest Portable 1 View  Result Date: 06/03/2018 CLINICAL DATA:  Chest pain and syncope tonight EXAM: PORTABLE CHEST 1 VIEW COMPARISON:  12/09/2005 FINDINGS: Cardiomegaly with mild aortic atherosclerosis. No acute pulmonary consolidation or edema. The right lateral costophrenic angle is excluded. No pleural effusion or pneumothorax. No acute osseous abnormality. IMPRESSION: Cardiomegaly with mild aortic atherosclerosis. No active pulmonary disease. Electronically Signed   By: Ashley Royalty M.D.   On: 06/03/2018 02:24    Cardiac Studies   LEFT HEART CATH AND CORONARY ANGIOGRAPHY  Conclusion    Difficult procedure from the right radial due to tortuosity and difficulty torquing diagnostic catheters.  Right dominant coronary anatomy  Moderate coronary atherosclerosis with 70% ostial stenosis and small first obtuse marginal, 40 % stenosis in the first diagonal, 50 % mid LAD stenosis, 40% mid RCA, 60% proximal PDA, and 50% left  ventricular branch of right coronary.  No significant obstructive CAD is identified.  Normal LVEDP  RECOMMENDATIONS:   Recent episodes of chest pain at rest, which are atypical in nature, are not due to significant obstructive disease.  Suspect nonischemic etiology.  Aggressive secondary risk modification with LDL less than 70 and other risk factor management as pertains to the patients on risk profile.    Assessment   1. Principal Problem: 2.   Syncope 3. Active Problems: 4.   Essential hypertension 5.   Hyperglycemia 6.   Chest pain 7.   PVC (premature ventricular contraction) 8.   Chronic systolic heart failure (HCC) 9.   Plan   1. Moderate non-obstructive CAD - will manage medically. 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 Will further increase BB as tolerated. Plan to arrange for 2 week ZIO Monitor for PVC burden - may need outpatient EP referral given frequent PVC's and PVST.  Ok to d/c from a cardiac standpoint.  [facility] will sign off.   Medication Recommendations:  Increase coreg to 25 mg BID Other recommendations (labs, testing, etc):  Outpatient 2  week monitor Follow up as an outpatient:  Dr. Debara Pickett in 4-6 weeks.  Time Spent Directly with Patient:  I have spent a total of 25 minutes with the patient reviewing hospital notes, telemetry, EKGs, labs and examining the patient as well as establishing an assessment and plan that was discussed personally with the patient.  > 50% of time was spent in direct patient care.  Length of Stay:  LOS: 0 days   Pixie Casino, MD, [facility], [place] Director of the [facility] Lipid Disorders &  Cardiovascular Risk Reduction Clinic Diplomate of the American Board of Clinical Lipidology Attending Cardiologist  Direct Dial: [phone]  Fax: [phone]  Website:  [email or web address] 06/04/2018, 8:17 AM

## 2018-06-04 NOTE — Discharge Summary (Addendum)
Physician Discharge Summary  [name] BEE:[id number] DOB: November 15, 1940 DOA: 06/03/2018  PCP: Vivi Barrack, MD  Admit date: 06/03/2018 Discharge date: 06/04/2018  Time spent: 45 minutes  Recommendations for Outpatient Follow-up:  1. Cardiology office will contact you for holter monitor and follow up appointment 2. Follow up with PCP 3-4 weeks for evaluation of electrolytes, diabetes control and BP control   Discharge Diagnoses:  Principal Problem:   Syncope Active Problems:   Essential hypertension   Hyperglycemia   Chest pain   PVC (premature ventricular contraction)   Chronic systolic heart failure ([place])   Discharge Condition: stable  Diet recommendation: low salt  Filed Weights   06/03/18 0119 06/03/18 1430 06/04/18 0625  Weight: 95.3 kg 96.3 kg 97.4 kg    History of present illness:  [name] is a 78 y.o. male with medical history significant of HTN; HLD; PVD s/p R BKA; and CAD with minimal plaque on cath in 2008 presented to ED 06/03/18 with chest pain and syncope.  Sometime around 1130-12 prior night, he noticed acute onset of severe chest pain around left breast.  Next thing he knew, he passed out.  +LOC.  He thought maybe his heart stopped beating and restarted - "it was heavy pain right there at my heart."  He thought he was just unconscious "for an instant."  He was not confused when he came to.  He  continued to have intermittent chest pain.  Last cath was several years ago.  Last echo was fairly recent, due again in June.  He did not  Have chest pain on admission  Hospital Course:  Syncope with chest pain -Patient with acute onset of sharp and severe CP along the left chest followed by syncopal episode. This was concerning for a malignant arrhythmia, particularly in light of frequent PVCs on EKG/telemetry. Cardiac cath 06/03/18 showed moderate non-obstructive CAD. Cardiology opined symptoms may be due to PSVT or PVC's predominantly a NICM. Recommended holter monitor  and increased BB with close follow up. In addition cards will reassess need for echo at OP follow up visit.   Chronic systolic heart failure -Echo in 4/19 showed worse biventricular function, with EF 40-45% and ungraded diastolic dysfunction. No repeat echo. Cards indicated will assess need for echo at follow up appointment  HTN -stable -Continue Coreg, Cozaar  DM -6/19 A1c was 5.9   Procedures: Cath 06/03/18  Left heart cath with coronary angiography and left ventricular hemodynamic recordings via right radial.  Procedure was quite difficult due to significant angulation in the innominate artery/aorta bifurcation making catheter movement and torque difficult.  Real-time vascular ultrasound was to assist access.  Widely patent left main  50% mid LAD diagonal bifurcation ([place] 011 post (  Circumflex contains luminal irregularities but is widely patent  Right coronary contains luminal irregularities but is widely patent  LVEDP 12 mmHg  No immediate complications            Consultations:  [facility]ilty cardiology  Discharge Exam: Vitals:   06/04/18 0625 06/04/18 0749  BP: 135/90 (!) 148/94  Pulse: 71 71  Resp: 18 20  Temp: 97.8 F (36.6 C) 98.8 F (37.1 C)  SpO2: 94% 95%    General: well nourished irritated in no acute distress Cardiovascular: rrr no MGR no LE edema Respiratory: normal effort BS clear bilaterally   Discharge Instructions   Discharge Instructions    Diet - low sodium heart healthy   Complete by:  As directed  Discharge instructions   Complete by:  As directed    Take medications as instructed Cardiology office will contact you for OP monitor and follow up appointment Follow up with PCP 1-2 weeks for evaluation of BP control and diabetes control   Increase activity slowly   Complete by:  As directed      Allergies as of 06/04/2018      Reactions   Entresto [sacubitril-valsartan]    Intolerant due to frequent urination   Lisinopril  Cough      Medication List    TAKE these medications   accu-chek multiclix lancets USE TO TEST ONCE DAILY   aspirin EC 81 MG tablet Take 81 mg by mouth daily.   aspirin-sod bicarb-citric acid 325 MG Tbef tablet Commonly known as:  ALKA-SELTZER Take 650 mg by mouth daily as needed (heart burn).   carvedilol 25 MG tablet Commonly known as:  COREG Take 1 tablet (25 mg total) by mouth 2 (two) times daily. What changed:    medication strength  how much to take   gabapentin 100 MG capsule Commonly known as:  NEURONTIN Take 1 capsule (100 mg total) by mouth 3 (three) times daily. What changed:  when to take this   glucose blood test strip Commonly known as:  ACCU-CHEK AVIVA PLUS USE TO TEST DAILY AS INSTRUCTED PER MD**EMERGENCY FILL MEDICAL REASON**   halobetasol 0.05 % cream Commonly known as:  ULTRAVATE APPLY TO AFFECTED AREA TWICE A DAY What changed:    how much to take  how to take this  when to take this  additional instructions   hydrocortisone cream 1 % Apply 1 application topically daily as needed for itching.   loratadine 10 MG tablet Commonly known as:  CLARITIN Take 10 mg by mouth daily as needed for allergies.   LORazepam 0.5 MG tablet Commonly known as:  ATIVAN Take 1 tablet (0.5 mg total) by mouth every 8 (eight) hours as needed for anxiety.   losartan 50 MG tablet Commonly known as:  COZAAR Take 1 tablet (50 mg total) by mouth daily.   metFORMIN 1000 MG tablet Commonly known as:  GLUCOPHAGE TAKE 1 TABLET (1,000 MG TOTAL) BY MOUTH 2 (TWO) TIMES DAILY.   methocarbamol 500 MG tablet Commonly known as:  ROBAXIN TAKE 1 TABLET EVERY 6 HOURS AS NEEDED MUSCLE SPASMS What changed:  See the new instructions.   multivitamin tablet Take 1 tablet by mouth daily.   nystatin cream Commonly known as:  MYCOSTATIN APPLY TO AFFECTED AREA TWICE A DAY What changed:  See the new instructions.   potassium chloride SA 20 MEQ tablet Commonly known as:   KLOR-CON M20 TAKE 2 TABLETS IN THE MORNING What changed:    how much to take  how to take this  when to take this   tamsulosin 0.4 MG Caps capsule Commonly known as:  FLOMAX TAKE 1 CAPSULE BY MOUTH 2 (TWO) TIMES DAILY. What changed:  See the new instructions.   traMADol 50 MG tablet Commonly known as:  ULTRAM Take 1 tablet (50 mg total) by mouth every 6 (six) hours as needed for moderate pain.   TURMERIC PO Take 1 tablet by mouth daily as needed (FOR LEG CRAMPS).   VISINE OP Place 1 drop into both eyes daily as needed (dry eyes).      Allergies  Allergen Reactions  . Entresto [Sacubitril-Valsartan]     Intolerant due to frequent urination  . Lisinopril Cough   Follow-up Information  Vivi Barrack, MD. Go on 06/12/2018.   Specialty:  Family Medicine Why:  @1 :40pm Contact information: [address] [place] [address] [phone]        Pixie Casino, MD Follow up.   Specialty:  Cardiology Why:  Dr. Lysbeth Penner office will call you to arrange 2 week Zio patch heart monitor as well as follow-up appointment. Contact information: [place] [address] [phone]            The results of significant diagnostics from this hospitalization (including imaging, microbiology, ancillary and laboratory) are listed below for reference.    Significant Diagnostic Studies: Dg Chest Portable 1 View  Result Date: 06/03/2018 CLINICAL DATA:  Chest pain and syncope tonight EXAM: PORTABLE CHEST 1 VIEW COMPARISON:  12/09/2005 FINDINGS: Cardiomegaly with mild aortic atherosclerosis. No acute pulmonary consolidation or edema. The right lateral costophrenic angle is excluded. No pleural effusion or pneumothorax. No acute osseous abnormality. IMPRESSION: Cardiomegaly with mild aortic atherosclerosis. No active pulmonary disease. Electronically Signed   By: Ashley Royalty M.D.   On: 06/03/2018 02:24    Microbiology: No results found for this or  any previous visit (from the past 240 hour(s)).   Labs: Basic Metabolic Panel: Recent Labs  Lab 06/03/18 0134 06/03/18 1038 06/04/18 0651  NA 139  --  138  K 4.5  --  3.8  CL 106  --  105  CO2 21*  --  25  GLUCOSE 132*  --  115*  BUN 18  --  14  CREATININE 0.86  --  0.76  CALCIUM 9.3  --  8.7*  MG  --  1.8  --    Liver Function Tests: No results for input(s): AST, ALT, ALKPHOS, BILITOT, PROT, ALBUMIN in the last 168 hours. No results for input(s): LIPASE, AMYLASE in the last 168 hours. No results for input(s): AMMONIA in the last 168 hours. CBC: Recent Labs  Lab 06/03/18 0134 06/04/18 0651  WBC 7.7 7.0  HGB 13.8 13.6  HCT 41.6 40.9  MCV 89.7 88.7  PLT 236 224   Cardiac Enzymes: Recent Labs  Lab 06/03/18 1038 06/03/18 1533 06/03/18 2152  TROPONINI <0.03 <0.03 <0.03   BNP: BNP (last 3 results) No results for input(s): BNP in the last 8760 hours.  ProBNP (last 3 results) No results for input(s): PROBNP in the last 8760 hours.  CBG: Recent Labs  Lab 06/03/18 0227 06/03/18 1414 06/03/18 1735 06/03/18 2111 06/04/18 0721  GLUCAP 115* 103* 112* 171* 110*       Signed:  Radene Gunning NP [facility] 06/04/2018, 11:55 AM

## 2018-06-09 ENCOUNTER — Telehealth: Payer: Self-pay | Admitting: Internal Medicine

## 2018-06-09 NOTE — Telephone Encounter (Signed)
New Message:    Patient calling having some concerns about a up coming appt.

## 2018-06-09 NOTE — Telephone Encounter (Signed)
LMTCB

## 2018-06-10 NOTE — Telephone Encounter (Signed)
Called patient, LVM on his number [phone]

## 2018-06-10 NOTE — Telephone Encounter (Signed)
Called patient, LMTCB.

## 2018-06-10 NOTE — Telephone Encounter (Signed)
Spoke with pt. Pt sts that he has a scheduled aoot in 06/16/18 to have cardiac monitor placed. Pt would like to cancel the appt. He does not want to wear a monitor. He has worn in the past and it was a pain. He wants to wait and discuss with Dr.Hilty. He is scheduled to see Dr.Hilty in 07/03/18. Adv pt that would be Dr.Hilty's earliest available. He is ok with waiting for his scheduled appt. Adv pt that I will fwd the update to Dr.Hilty. Pt voiced appreciation.

## 2018-06-11 ENCOUNTER — Other Ambulatory Visit (INDEPENDENT_AMBULATORY_CARE_PROVIDER_SITE_OTHER): Payer: Self-pay | Admitting: Orthopedic Surgery

## 2018-06-12 ENCOUNTER — Inpatient Hospital Stay: Payer: Medicare Other | Admitting: Family Medicine

## 2018-06-16 ENCOUNTER — Encounter: Payer: Self-pay | Admitting: Family Medicine

## 2018-06-16 ENCOUNTER — Ambulatory Visit (INDEPENDENT_AMBULATORY_CARE_PROVIDER_SITE_OTHER): Payer: Medicare Other | Admitting: Family Medicine

## 2018-06-16 VITALS — BP 122/64 | HR 86 | Temp 97.8°F | Ht 72.0 in | Wt 228.4 lb

## 2018-06-16 DIAGNOSIS — Z89511 Acquired absence of right leg below knee: Secondary | ICD-10-CM | POA: Diagnosis not present

## 2018-06-16 DIAGNOSIS — R351 Nocturia: Secondary | ICD-10-CM | POA: Diagnosis not present

## 2018-06-16 DIAGNOSIS — L309 Dermatitis, unspecified: Secondary | ICD-10-CM | POA: Diagnosis not present

## 2018-06-16 DIAGNOSIS — N401 Enlarged prostate with lower urinary tract symptoms: Secondary | ICD-10-CM | POA: Diagnosis not present

## 2018-06-16 DIAGNOSIS — M545 Low back pain: Secondary | ICD-10-CM

## 2018-06-16 DIAGNOSIS — G8929 Other chronic pain: Secondary | ICD-10-CM

## 2018-06-16 DIAGNOSIS — R55 Syncope and collapse: Secondary | ICD-10-CM | POA: Diagnosis not present

## 2018-06-16 DIAGNOSIS — I10 Essential (primary) hypertension: Secondary | ICD-10-CM | POA: Diagnosis not present

## 2018-06-16 DIAGNOSIS — R739 Hyperglycemia, unspecified: Secondary | ICD-10-CM

## 2018-06-16 MED ORDER — TRAMADOL HCL 50 MG PO TABS: 50.0000 mg | ORAL_TABLET | Freq: Four times a day (QID) | ORAL | 0 refills | Status: DC | PRN

## 2018-06-16 NOTE — Patient Instructions (Signed)
It was very nice to see you today!  I will refill your tramadol.  Try using the halobetasol for the rash on your legs.  Come back to see me in 6 months, or sooner as needed.   Take care, Dr Jerline Pain

## 2018-06-16 NOTE — Assessment & Plan Note (Addendum)
Stable.  Continue gabapentin 100 mg twice daily and tramadol as needed.

## 2018-06-16 NOTE — Assessment & Plan Note (Signed)
Continue metformin 1000 mg twice daily.  Follow-up with me in 3 months.  Repeat A1c at that time.

## 2018-06-16 NOTE — Progress Notes (Addendum)
   Subjective:  [name] is a 78 y.o. male who presents today with a chief complaint of syncope follow up.   HPI:  Syncope Patient presented to the ED on 06/03/2018 with chest pain and syncope.  He was admitted for further work-up.  Underwent catheterization which showed moderate nonobstructive CAD.  Patient symptoms were most likely thought to be due to PSVT or PVCs.  He was discharged home Holter monitor set up.  While hospitalized, he had his dose of Coreg increased to 25 mg twice daily.  He has done well with this.  He has not had any recurrence of palpitations or syncope.  He will be following up with cardiology in 2.5 weeks.  His stable, chronic medical conditions are outlined below:  # Hyperglycemia -Currently on metformin 1000 mg twice daily. Tolerating well with side effects.   # Chronic low back pain / Phantom limb pain s/p right BKA -Currently on gabapentin 100 mg twice daily, tramadol 50mg  every 8 hours as needed.  Tolerating well.  # HTN -Currently on Coreg 25 twice daily and losartan 50 mg daily.  Tolerating well. - No chest pain or shortness of breath.  # Dermatitis - Uses halobetasol as needed -Recently worsened on left lower extremity  # BPH - On flomax 0.4mg  daily and tolerating well  ROS: Per HPI  PMH: He reports that he quit smoking about 36 years ago. He quit after 20.00 years of use. He has never used smokeless tobacco. He reports current alcohol use. He reports that he does not use drugs.  Objective:  Physical Exam: BP 122/64 (BP Location: Left Arm, Patient Position: Sitting, Cuff Size: Normal)   Pulse 86   Temp 97.8 F (36.6 C) (Oral)   Ht 6' (1.829 m)   Wt 228 lb 6.4 oz (103.6 kg)   SpO2 97%   BMI 30.98 kg/m   Gen: NAD, resting comfortably CV: RRR with no murmurs appreciated Pulm: NWOB, CTAB with no crackles, wheezes, or rhonchi Skin: Diffuse scattered erythematous areas with overlying scale on left lower extremity. Neuro: Grossly normal,  moves all extremities Psych: Normal affect and thought content  Assessment/Plan:  Syncope  Doing much better over the last couple of weeks.  No recurrent episodes.  Continue current treatment plan with Coreg 25 mg twice daily.  He will follow-up with cardiology in a couple of weeks.  Hyperglycemia Continue metformin 1000 mg twice daily.  Follow-up with me in 3 months.  Repeat A1c at that time.  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  Continue gabapentin 100 mg twice daily and tramadol as needed.  Essential hypertension At goal.  Continue Coreg 25 mg twice daily and losartan 50 mg daily.  Chronic midline low back pain without sciatica Stable.  No red flags.  Continue gabapentin 100 mg twice daily and tramadol 50 mg as needed.  Benign prostatic hyperplasia with nocturia Stable.  Continue Flomax 0.4 mg daily.  Dermatitis Recommended halobetasol twice daily for the next week.  Algis Greenhouse. Jerline Pain, MD 06/16/2018 5:21 PM

## 2018-06-16 NOTE — Assessment & Plan Note (Signed)
Stable.  Continue Flomax 0.4 mg daily.

## 2018-06-16 NOTE — Assessment & Plan Note (Addendum)
Doing much better over the last couple of weeks.  No recurrent episodes.  Continue current treatment plan with Coreg 25 mg twice daily.  He will follow-up with cardiology in a couple of weeks.

## 2018-06-16 NOTE — Assessment & Plan Note (Signed)
Recommended halobetasol twice daily for the next week.

## 2018-06-16 NOTE — Assessment & Plan Note (Signed)
Stable.  No red flags.  Continue gabapentin 100 mg twice daily and tramadol 50 mg as needed.

## 2018-06-16 NOTE — Assessment & Plan Note (Signed)
At goal.  Continue Coreg 25 mg twice daily and losartan 50 mg daily.

## 2018-07-03 ENCOUNTER — Other Ambulatory Visit: Payer: Self-pay

## 2018-07-03 ENCOUNTER — Encounter: Payer: Self-pay | Admitting: Internal Medicine

## 2018-07-03 ENCOUNTER — Ambulatory Visit (INDEPENDENT_AMBULATORY_CARE_PROVIDER_SITE_OTHER): Payer: Medicare Other | Admitting: Internal Medicine

## 2018-07-03 VITALS — BP 141/81 | HR 64 | Ht 72.0 in | Wt 225.4 lb

## 2018-07-03 DIAGNOSIS — I10 Essential (primary) hypertension: Secondary | ICD-10-CM | POA: Diagnosis not present

## 2018-07-03 DIAGNOSIS — L309 Dermatitis, unspecified: Secondary | ICD-10-CM

## 2018-07-03 DIAGNOSIS — I428 Other cardiomyopathies: Secondary | ICD-10-CM

## 2018-07-03 DIAGNOSIS — I5022 Chronic systolic (congestive) heart failure: Secondary | ICD-10-CM

## 2018-07-03 DIAGNOSIS — I493 Ventricular premature depolarization: Secondary | ICD-10-CM

## 2018-07-03 MED ORDER — NYSTATIN 100000 UNIT/GM EX CREA: 1.0000 "application " | TOPICAL_CREAM | Freq: Two times a day (BID) | CUTANEOUS | 2 refills | Status: DC

## 2018-07-03 MED ORDER — SACUBITRIL-VALSARTAN 49-51 MG PO TABS: 1.0000 | ORAL_TABLET | Freq: Two times a day (BID) | ORAL | 6 refills | Status: DC

## 2018-07-03 NOTE — Patient Instructions (Signed)
Medication Instructions:  STOP- Losartan  START- Entresto 49/51 mg twice a day  If you need a refill on your cardiac medications before your next appointment, please call your pharmacy.  Labwork: BMP in 1 week HERE IN OUR OFFICE AT [facility]  You will need to fast. DO NOT EAT OR DRINK PAST MIDNIGHT.     You will NOT need to fast   Take the provided lab slips with you to the lab for your blood draw.   When you have your labs (blood work) drawn today and your tests are completely normal, you will receive your results only by MyChart Message (if you have MyChart) -OR-  A paper copy in the mail.  If you have any lab test that is abnormal or we need to change your treatment, we will call you to review these results.  Testing/Procedures: None Ordered  Follow-Up: . Your physician recommends that you schedule a follow-up appointment in: 3 Months   At [facility], you and your health needs are our priority.  As part of our continuing mission to provide you with exceptional heart care, we have created designated Provider Care Teams.  These Care Teams include your primary Cardiologist (physician) and Advanced Practice Providers (APPs -  Physician Assistants and Nurse Practitioners) who all work together to provide you with the care you need, when you need it.  Thank you for choosing [facility] at [facility]!!

## 2018-07-03 NOTE — Progress Notes (Signed)
OFFICE NOTE  Chief Complaint:  Follow-up heart cath  Primary Care Physician: Vivi Barrack, MD  HPI:  [name] is a 78 y.o. male he was previously seen by Dr. Percival Spanish last in 2013. He was followed for PVCs at that time and underwent stress testing. There is no evidence for ischemia and no further workup was recommended. Recently was hospitalized for sepsis and was noted to have intermittent SVTs as well as PVCs on telemetry. Although he was not seen by cardiology as an inpatient, we were consult it for outpatient monitor. He was placed on a monitor which I reviewed and is still in progress. The first 24 days of the monitor show frequent PVCs as well as intermittent SVT. During this past hospitalization he had an echocardiogram which shows a newly reduced LVEF of 45-50%, with basal inferior akinesis and inferolateral hypokinesis and grade 2 diastolic dysfunction. This compares to a normal LVF and 2013 by echo. He did have a remote left heart catheterization in 2008 which showed minimal coronary artery disease. He denies any chest pain or worsening shortness of breath. He is having trouble ambulating due to problems with his right ankle. He also reports being under significant stress dealing with his wife who has Parkinson's. He is in the process of moving to friends [place].  01/30/2017  [name] returns today for follow-up. He has had a difficult year. His wife was diagnosed with Parkinson's and ultimately died of congestive heart failure. He developed a diabetic wound and then had the transtibial amputation of the right leg. He was supposed going to friends home but ultimately went to [facility] for rehabilitation and is currently back at home. He does have a leg prostheses. He continues to have SVT in fact has a short RP tachycardia which was caught today in the office. He says is asymptomatic with this. EF is shown to be 45-50% in the past. He did undergo nuclear stress test which was  negative for ischemia prior to his amputation. He denies any chest pain.  09/03/2017  [name] returns today for follow-up.  He has no new complaints today.  He has been struggling with some back pain.  He feels like his right leg prosthesis may be a little short.  He denies any recurrent SVT.  He was placed on the carvedilol for cardiomyopathy with EF 45-50% in 2017 and a blood pressures been well controlled on that.  He denies any chest pain.  He reports occasional shortness of breath sometimes with exertion but also at rest.  10/02/2017  [name] was seen today in follow-up.  He continues to get some care from the [place].  He did report some problems with his right leg prosthesis.  He has not been able to exercise since then.  He gets mild shortness of breath with exertion, consistent with NYHA class II symptoms.  Recent echo was performed to see if he had any improvement in LV function, unfortunately however he had a decline in LVEF to 40 to 45%.  There is additionally some mild to moderate RV dysfunction.  He is only on carvedilol.  I think he would benefit from the addition of Entresto.  Labs last year indicated normal renal function.  11/12/2017  [name] returns today for follow-up.  His main concerns are frequent urination both throughout the day and at night.  He was started on tamsulosin by his PCP which he takes 0.4 mg in the morning.  He does  not note any significant improvement.  I had started him on Entresto however due to frequent urination he discontinued the medication.  He was then switched to lisinopril.  He seems to be tolerating this.  LVEF had declined recently to 40 to 45%.  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  05/12/2018  [name] is seen today in follow-up.  He does report some improvement in his urination.  I increase his dose to 0.8 mg tamsulosin however he said he had excessive urination with this and seems  to do better on the 0.4 mg.  His PCP had switched him to Rapaflo, but he said that was also worse.  He denies any worsening shortness of breath or chest pain.  He unfortunately was intolerant of Entresto due to frequent urination.  His EF had lower down to 40 to 45%.  Hopefully this is all related to his infection and now he is status post amputation and recovering.  He will need a repeat assessment of his EF next summer.  He also is reported some cough.  This might be related to lisinopril.  07/03/2018  [name] is seen today in follow-up of left heart cath.  This was performed for declining LVEF, episodes of chest pain at rest, and history of coronary disease.  He underwent left heart catheterization which demonstrated moderate coronary atherosclerosis with 70% stenosis of a small obtuse marginal, 40% stenosis in the first diagonal, 50% mid LAD stenosis, 40% mid RCA and 60% proximal PDA stenosis.  No significant obstructive coronary disease was identified and LVEDP was normal.  Aggressive secondary risk factor modification was recommended.  He returns today and says he is feeling great.  He says he is not sure what all the work-up was for.  I am concerned that because of his frequency of PVCs that at this point he may also have had a PVC mediated cardiomyopathy.  He was ordered to wear a monitor however he declined.  He does not see the value in that.  Therefore we may be left only with medical therapy.  PMHx:  Past Medical History:  Diagnosis Date  . CAD (coronary artery disease)    a. Remote cath 2008 for chest pain showed 30% LAD with luminal irregularity and fairly heavy calcification in the mid LAD without critical stenosis, 30% OM1, 30% acute marginal.  . Cancer (HCC)    skin cancer - basal  . Cataract    both  . Chronic combined systolic and diastolic CHF (congestive heart failure) ([place])   . Complication of anesthesia   . Diabetes mellitus    Type II  . ED (erectile dysfunction)   .  History of kidney stones    x1  . Hypertension   . NICM (nonischemic cardiomyopathy) ([place])    a. EF 40-45% by echo in 08/2017 - prior low risk stress test in 2017.  . Osteoarthritis   . Paroxysmal SVT (supraventricular tachycardia) ([place])   . PONV (postoperative nausea and vomiting) 1960   with Ether  . PVC's (premature ventricular contractions)   . PVD (peripheral vascular disease) ([place])    s/p R BKA  . Rheumatic fever   . Rhinitis   . S/P BKA (below knee amputation), right [facility])     Past Surgical History:  Procedure Laterality Date  . AMPUTATION Right 02/29/2016   Procedure: Right Leg AMPUTATION BELOW KNEE with Wound Vac placement;  Surgeon: Beverely Low  Fernanda Drum, MD;  Location: [place];  Service: Orthopedics;  Laterality: Right;  . CHOLECYSTECTOMY  1983  . colonoscopy  2006, 2010  . EYE SURGERY Bilateral    cataract  . INGUINAL HERNIA REPAIR Right 1960  . LEFT HEART CATH AND CORONARY ANGIOGRAPHY N/A 06/03/2018   Procedure: LEFT HEART CATH AND CORONARY ANGIOGRAPHY;  Surgeon: Belva Crome, MD;  Location: [place] [facility];  Service: Cardiovascular;  Laterality: N/A;  . STUMP REVISION Right 10/30/2017   Procedure: RIGHT BELOW KNEE AMPUTATION REVISION;  Surgeon: [name], MD;  Location: [place];  Service: Orthopedics;  Laterality: Right;    FAMHx:  Family History  Problem Relation Age of Onset  . Stroke Father 87       Died age 35  . Coronary artery disease Brother 26  . Colon cancer Neg Hx   . Colon polyps Neg Hx   . Diabetes Neg Hx   . Esophageal cancer Neg Hx   . Kidney disease Neg Hx   . Gallbladder disease Neg Hx     SOCHx:   reports that he quit smoking about 36 years ago. He quit after 20.00 years of use. He has never used smokeless tobacco. He reports current alcohol use. He reports that he does not use drugs.  ALLERGIES:  Allergies  Allergen Reactions  . Entresto [Sacubitril-Valsartan]     Intolerant due to frequent urination  . Lisinopril Cough     ROS: Pertinent items noted in HPI and remainder of comprehensive ROS otherwise negative.  HOME MEDS: Current Outpatient Medications  Medication Sig Dispense Refill  . aspirin EC 81 MG tablet Take 81 mg by mouth daily.    Marland Kitchen aspirin-sod bicarb-citric acid (ALKA-SELTZER) 325 MG TBEF tablet Take 650 mg by mouth daily as needed (heart burn).    . carvedilol (COREG) 25 MG tablet Take 1 tablet (25 mg total) by mouth 2 (two) times daily. 60 tablet 0  . gabapentin (NEURONTIN) 100 MG capsule Take 1 capsule (100 mg total) by mouth 3 (three) times daily. (Patient taking differently: Take 100 mg by mouth 2 (two) times daily. ) 270 capsule 3  . glucose blood (ACCU-CHEK AVIVA PLUS) test strip USE TO TEST DAILY AS INSTRUCTED PER MD**EMERGENCY FILL MEDICAL REASON** 100 each 0  . halobetasol (ULTRAVATE) 0.05 % cream APPLY TO AFFECTED AREA TWICE A DAY (Patient taking differently: Apply 1 application topically 2 (two) times daily. ) 50 g 2  . hydrocortisone cream 1 % Apply 1 application topically daily as needed for itching.    . Lancets (ACCU-CHEK MULTICLIX) lancets USE TO TEST ONCE DAILY 102 each 2  . loratadine (CLARITIN) 10 MG tablet Take 10 mg by mouth daily as needed for allergies.     Marland Kitchen LORazepam (ATIVAN) 0.5 MG tablet Take 1 tablet (0.5 mg total) by mouth every 8 (eight) hours as needed for anxiety. 15 tablet 0  . metFORMIN (GLUCOPHAGE) 1000 MG tablet TAKE 1 TABLET (1,000 MG TOTAL) BY MOUTH 2 (TWO) TIMES DAILY. 180 tablet 2  . Multiple Vitamin (MULTIVITAMIN) tablet Take 1 tablet by mouth daily.    Marland Kitchen nystatin cream (MYCOSTATIN) Apply 1 application topically 2 (two) times daily. 30 g 2  . potassium chloride SA (KLOR-CON M20) 20 MEQ tablet TAKE 2 TABLETS IN THE MORNING (Patient taking differently: Take 80 mEq by mouth daily. TAKE 2 TABLETS IN THE MORNING) 180 tablet 3  . tamsulosin (FLOMAX) 0.4 MG CAPS capsule TAKE 1 CAPSULE BY MOUTH 2 (TWO) TIMES  DAILY. (Patient taking differently: Take 0.4 mg by mouth  daily. ) 180 capsule 1  . Tetrahydrozoline HCl (VISINE OP) Place 1 drop into both eyes daily as needed (dry eyes).    . traMADol (ULTRAM) 50 MG tablet Take 1 tablet (50 mg total) by mouth every 6 (six) hours as needed for moderate pain. 60 tablet 0  . TURMERIC PO Take 1 tablet by mouth daily as needed (FOR LEG CRAMPS).     . sacubitril-valsartan (ENTRESTO) 49-51 MG Take 1 tablet by mouth 2 (two) times daily. 60 tablet 6   No current facility-administered medications for this visit.     LABS/IMAGING: No results found for this or any previous visit (from the past 48 hour(s)). No results found.  WEIGHTS: Wt Readings from Last 3 Encounters:  07/03/18 225 lb 6.4 oz (102.2 kg)  06/16/18 228 lb 6.4 oz (103.6 kg)  06/04/18 214 lb 11.7 oz (97.4 kg)    VITALS: BP (!) 141/81   Pulse 64   Ht 6' (1.829 m)   Wt 225 lb 6.4 oz (102.2 kg)   BMI 30.57 kg/m   EXAM: General appearance: alert, no distress and mildly obese Lungs: clear to auscultation bilaterally Heart: regular rate and rhythm, S1, S2 normal, no murmur, click, rub or gallop Abdomen: soft, non-tender; bowel sounds normal; no masses,  no organomegaly Extremities: Trace left lower extremity edema, status post right BKA Psych: Mildly anxious  EKG: Deferred  ASSESSMENT: 1. Worsening cardiomyopathy with EF 40-45% (08/2017), NYHA Class II symptoms 2. Recurrent short RP tachycardia  3. Hypertension 4. Type 2 diabetes 5. Right BKA  PLAN: 1.   Mr. Veldhuizen has moderately reduced LV function with NYHA class II symptoms.  He may be having frequent PVCs however he is on beta-blocker therapy.  His cath showed moderate nonobstructive coronary disease.  At this point were treating him as a nonischemic cardiomyopathy.  He is not interested in further monitoring to look at his PVC burden.  In review his medications he is already on high-dose carvedilol and losartan.  I would recommend we DC the losartan and switch him to [facility].  In the past he  apparently had had frequent urination on Entresto however I believe it could be well-tolerated.  He is agreeable to try it again.  Start 49/51 mg twice daily.  Follow-up with me in 3 months for medication titration and repeat a metabolic profile in 1 week.  Pixie Casino, MD, [facility], [place] Director of the Advanced Lipid Disorders &  Cardiovascular Risk Reduction Clinic Diplomate of the American Board of Clinical Lipidology Attending Cardiologist  Direct Dial: [phone]  Fax: [phone]  Website:  [email or web address] 07/03/2018, 4:48 PM

## 2018-07-08 ENCOUNTER — Other Ambulatory Visit: Payer: Self-pay

## 2018-07-08 MED ORDER — GLUCOSE BLOOD VI STRP: ORAL_STRIP | 11 refills | Status: DC

## 2018-07-10 ENCOUNTER — Other Ambulatory Visit: Payer: Self-pay

## 2018-07-10 DIAGNOSIS — I428 Other cardiomyopathies: Secondary | ICD-10-CM | POA: Diagnosis not present

## 2018-07-10 LAB — BASIC METABOLIC PANEL
BUN/Creatinine Ratio: 19 (ref 10–24)
BUN: 15 mg/dL (ref 8–27)
CO2: 22 mmol/L (ref 20–29)
Calcium: 9.1 mg/dL (ref 8.6–10.2)
Chloride: 101 mmol/L (ref 96–106)
Creatinine, Ser: 0.79 mg/dL (ref 0.76–1.27)
GFR calc Af Amer: 99 mL/min/{1.73_m2} (ref >59)
GFR calc non Af Amer: 86 mL/min/{1.73_m2} (ref >59)
Glucose: 164 mg/dL — ABNORMAL HIGH (ref 65–99)
Potassium: 4.5 mmol/L (ref 3.5–5.2)
Sodium: 140 mmol/L (ref 134–144)

## 2018-07-10 MED ORDER — GLUCOSE BLOOD VI STRP: ORAL_STRIP | 11 refills | Status: DC

## 2018-07-14 ENCOUNTER — Other Ambulatory Visit: Payer: Self-pay | Admitting: Internal Medicine

## 2018-07-14 MED ORDER — CARVEDILOL 25 MG PO TABS: 25.0000 mg | ORAL_TABLET | Freq: Two times a day (BID) | ORAL | 3 refills | Status: DC

## 2018-07-14 NOTE — Telephone Encounter (Signed)
Rx(s) sent to pharmacy electronically.  

## 2018-07-14 NOTE — Telephone Encounter (Signed)
° ° °  Patient is out of medication   1. Which medications need to be refilled? (please list name of each medication and dose if known) carvedilol (COREG) 25 MG tablet  2. Which pharmacy/location (including street and city if local pharmacy) is medication to be sent to? [facility] [place], [place] - [address]  3. Do they need a 30 day or 90 day supply? [place]

## 2018-07-23 ENCOUNTER — Telehealth: Payer: Self-pay

## 2018-07-23 NOTE — Telephone Encounter (Signed)
Patient dropped off a patients assistance form for Entresto. Contacted the patient to let him know that the form has been completed and signed by Dr.Hilty. Patient request that the from be mailed to the PO Box listed in his chart. Reminded the patient to remember to attach the required documentation before submitting to [employer]. [name] verbalized understanding and voiced appreciation for the assistance.

## 2018-08-04 DIAGNOSIS — Z85828 Personal history of other malignant neoplasm of skin: Secondary | ICD-10-CM | POA: Diagnosis not present

## 2018-08-04 DIAGNOSIS — L570 Actinic keratosis: Secondary | ICD-10-CM | POA: Diagnosis not present

## 2018-08-04 DIAGNOSIS — L308 Other specified dermatitis: Secondary | ICD-10-CM | POA: Diagnosis not present

## 2018-08-14 ENCOUNTER — Other Ambulatory Visit: Payer: Self-pay | Admitting: *Deleted

## 2018-08-21 ENCOUNTER — Other Ambulatory Visit (INDEPENDENT_AMBULATORY_CARE_PROVIDER_SITE_OTHER): Payer: Self-pay | Admitting: Orthopedic Surgery

## 2018-09-11 DIAGNOSIS — Z961 Presence of intraocular lens: Secondary | ICD-10-CM | POA: Diagnosis not present

## 2018-09-11 DIAGNOSIS — H26493 Other secondary cataract, bilateral: Secondary | ICD-10-CM | POA: Diagnosis not present

## 2018-09-11 DIAGNOSIS — H5203 Hypermetropia, bilateral: Secondary | ICD-10-CM | POA: Diagnosis not present

## 2018-09-16 ENCOUNTER — Other Ambulatory Visit: Payer: Self-pay | Admitting: Family Medicine

## 2018-09-16 DIAGNOSIS — L309 Dermatitis, unspecified: Secondary | ICD-10-CM

## 2018-09-24 ENCOUNTER — Other Ambulatory Visit: Payer: Self-pay | Admitting: Family Medicine

## 2018-09-25 ENCOUNTER — Other Ambulatory Visit: Payer: Self-pay

## 2018-09-25 MED ORDER — METFORMIN HCL 1000 MG PO TABS: 1000.0000 mg | ORAL_TABLET | Freq: Two times a day (BID) | ORAL | 2 refills | Status: DC

## 2018-09-29 ENCOUNTER — Telehealth: Payer: Self-pay

## 2018-09-29 NOTE — Telephone Encounter (Signed)
Called patient to discuss upcoming appointment on 10/01/18 at 1:30p.  lmtcb

## 2018-09-30 ENCOUNTER — Telehealth: Payer: Self-pay | Admitting: Internal Medicine

## 2018-09-30 NOTE — Telephone Encounter (Signed)
I attempted to contact the patient back, advised that we are only seeing patients in office for emergency visits at this time, and we could switch his appointment to a virtual visit. He states that he does not care and for [place] to whatever, that he was unhappy with his visit from before. I advised patient that during this visit he would speak with Dr.Hilty regarding the questions that he has, and advised him I needed his consent to have a virtual visit, patient did not give consent- he states to "do whatever" and hangs up.  Will route to nurse and schedule scrub to make them aware.

## 2018-09-30 NOTE — Telephone Encounter (Signed)
° ° °  Patient requesting a call back BEFORE he changes his appointment to a virtual visit. Patient states the "last time he was seen he waiting for 2 hours, saw the doctor for 5 minutes and sill did not get his questions answered" Patient wants to discuss process of virtual visit. Please call

## 2018-10-01 ENCOUNTER — Ambulatory Visit: Payer: Medicare Other | Admitting: Internal Medicine

## 2018-10-01 NOTE — Telephone Encounter (Signed)
Patient cancelled appointment for 10/01/2018

## 2018-10-07 DIAGNOSIS — H26492 Other secondary cataract, left eye: Secondary | ICD-10-CM | POA: Diagnosis not present

## 2018-10-29 ENCOUNTER — Telehealth (HOSPITAL_COMMUNITY): Payer: Self-pay | Admitting: Radiology

## 2018-10-29 NOTE — Telephone Encounter (Signed)
Left message to return call. Need to schedule echocardiogram.

## 2018-11-13 ENCOUNTER — Telehealth (HOSPITAL_COMMUNITY): Payer: Self-pay

## 2018-11-13 NOTE — Telephone Encounter (Signed)

## 2018-11-14 ENCOUNTER — Other Ambulatory Visit (HOSPITAL_COMMUNITY): Payer: Medicare Other

## 2018-11-14 ENCOUNTER — Ambulatory Visit (HOSPITAL_COMMUNITY): Payer: Medicare Other | Attending: Cardiology

## 2018-11-14 ENCOUNTER — Other Ambulatory Visit: Payer: Self-pay

## 2018-11-14 DIAGNOSIS — I428 Other cardiomyopathies: Secondary | ICD-10-CM | POA: Insufficient documentation

## 2018-11-24 NOTE — Telephone Encounter (Signed)
Opened in error

## 2018-12-13 ENCOUNTER — Ambulatory Visit (INDEPENDENT_AMBULATORY_CARE_PROVIDER_SITE_OTHER): Payer: Medicare Other | Admitting: Family Medicine

## 2018-12-13 ENCOUNTER — Other Ambulatory Visit: Payer: Self-pay

## 2018-12-13 ENCOUNTER — Encounter: Payer: Self-pay | Admitting: Family Medicine

## 2018-12-13 VITALS — BP 135/73 | HR 54 | Temp 95.6°F | Ht 72.0 in | Wt 215.0 lb

## 2018-12-13 DIAGNOSIS — R21 Rash and other nonspecific skin eruption: Secondary | ICD-10-CM | POA: Diagnosis not present

## 2018-12-13 NOTE — Progress Notes (Signed)
Phone [phone]  I acted as a Education administrator for Dr. Jenkins Rouge, LPN Subjective:  Virtual visit via phonenote Chief Complaint  Patient presents with  . Rash    This visit type was conducted due to national recommendations for restrictions regarding the COVID-19 Pandemic (e.g. social distancing).  This format is felt to be most appropriate for this patient at this time balancing risks to patient and risks to population by having him in for in person visit.  All issues noted in this document were discussed and addressed.  No physical exam was performed (except for noted visual exam or audio findings with Telehealth visits).  The patient has consented to conduct a Telehealth visit and understands insurance will be billed.   Our team/I connected with [name] at 10:20 AM EDT by phone (patient did not have equipment for webex) and verified that I am speaking with the correct person using two identifiers.  Location patient: [facility] Location provider: [facility], office Persons participating in the virtual visit:  patient  Time on phone: 16 minutes Counseling provided about patient also had qeustions about flomax and gabapentin- I ultimately asked him to discuss with prescribing physicians.   Our team/I discussed the limitations of evaluation and management by telemedicine and the availability of in person appointments. In light of current covid-19 pandemic, patient also understands that we are trying to protect them by minimizing in office contact if at all possible.  The patient expressed consent for telemedicine visit and agreed to proceed. Patient understands insurance will be billed.   ROS-  See ros below  Past Medical History-  Patient Active Problem List   Diagnosis Date Noted  . Dermatitis 03/10/2018  . Benign prostatic hyperplasia with nocturia 11/12/2017  . Nonischemic cardiomyopathy ([place]) 09/03/2017  . AVNRT (AV nodal re-entry tachycardia) ([place]) 01/30/2017  .  Chronic midline low back pain without sciatica 01/03/2017  . H/O amputation of leg through tibia and fibula, right ([place]) 05/03/2016  . Phantom limb pain ([place]) 03/06/2016  . SVT (supraventricular tachycardia) ([place]) 01/20/2016  . Chronic systolic heart failure ([place]) 01/20/2016  . Anxiety state 10/26/2013  . Hyperglycemia 11/02/2010  . BASAL CELL CARCINOMA, FACE 10/18/2008  . LIPOMA, SKIN 05/31/2008  . ERECTILE DYSFUNCTION 09/30/2007  . OSTEOARTHRITIS 08/08/2007  . Essential hypertension 03/24/2007    Medications- reviewed and updated Current Outpatient Medications  Medication Sig Dispense Refill  . aspirin 325 MG tablet Take 325 mg by mouth daily.    Marland Kitchen aspirin-sod bicarb-citric acid (ALKA-SELTZER) 325 MG TBEF tablet Take 650 mg by mouth daily as needed (heart burn).    . carvedilol (COREG) 25 MG tablet Take 1 tablet (25 mg total) by mouth 2 (two) times daily. 180 tablet 3  . gabapentin (NEURONTIN) 100 MG capsule TAKE 1 CAPSULE BY MOUTH THREE TIMES A DAY 270 capsule 3  . glucose blood (ACCU-CHEK AVIVA PLUS) test strip USE TO TEST ONCE DAILY 100 each 11  . halobetasol (ULTRAVATE) 0.05 % cream Apply 1 application topically 2 (two) times daily. 50 g 2  . hydrocortisone cream 1 % Apply 1 application topically daily as needed for itching.    . Lancets (ACCU-CHEK MULTICLIX) lancets USE TO TEST ONCE DAILY 102 each 2  . loratadine (CLARITIN) 10 MG tablet Take 10 mg by mouth daily as needed for allergies.     Marland Kitchen LORazepam (ATIVAN) 0.5 MG tablet Take 1 tablet (0.5 mg total) by mouth every 8 (eight) hours as needed for anxiety. 15 tablet 0  .  metFORMIN (GLUCOPHAGE) 1000 MG tablet Take 1 tablet (1,000 mg total) by mouth 2 (two) times daily. 180 tablet 2  . Multiple Vitamin (MULTIVITAMIN) tablet Take 1 tablet by mouth daily.    Marland Kitchen nystatin cream (MYCOSTATIN) Apply 1 application topically 2 (two) times daily. 30 g 2  . potassium chloride SA (KLOR-CON M20) 20 MEQ tablet TAKE 2 TABLETS IN THE MORNING 180  tablet 3  . sacubitril-valsartan (ENTRESTO) 49-51 MG Take 1 tablet by mouth 2 (two) times daily. 60 tablet 6  . tamsulosin (FLOMAX) 0.4 MG CAPS capsule TAKE 1 CAPSULE BY MOUTH 2 (TWO) TIMES DAILY. (Patient taking differently: Take 0.4 mg by mouth daily. ) 180 capsule 1  . Tetrahydrozoline HCl (VISINE OP) Place 1 drop into both eyes daily as needed (dry eyes).    . traMADol (ULTRAM) 50 MG tablet Take 1 tablet (50 mg total) by mouth every 6 (six) hours as needed for moderate pain. 60 tablet 0   No current facility-administered medications for this visit.      Objective:  BP 135/73 Comment: Perpt  Pulse (!) 54 Comment: Per pt  Temp (!) 95.6 F (35.3 C) (Oral)   Ht 6' (1.829 m)   Wt 215 lb (97.5 kg) Comment: Per pt  SpO2 95% Comment: per pt  BMI 29.16 kg/m  self reported vitals  Nonlabored voice, normal speech      Assessment and Plan   #Rash S:Pt c/o rash on both arms x 1 week. Started at left wrist only  and goes up arm now to the arm pit and then started on the right arm- left arm seems to be fading some- right arm not fading at all (this rash up to the elbow). Both arms are red and itching. He has used cortisone (actually it is triamcinolone from his dermatologist) which helps with itching. Denies plant contact- does not work in the yard- lives in [place]. Has also tried halobetasol mixed with nystatin and that did nto help- previously this helped near his prosthesis site.   Denies new soaps, laundry detergents, fabric softeners. Has had eczema in past- reminds him of that but skin not rough. No glaucoma.  ROS-not ill appearing, no fever/chills. No new medications. Not immunocompromised. No mucus membrane involvement.  A/P: Unclear etiology but improving on triamcinolone. No clear red flags by phone but unable to fully evaluate rash by phone call. Offered to send in trial of prednisone but he wants to avoid this for now. He will continue triamcinolone for another week (has only tried  for a few days and I think up to 10 day trial reasonable)   Recommended follow up: advised 1 week follow up if fails to improve and would recommend in person visit as long as covid 19 symptom free Future Appointments  Date Time Provider [place]  01/21/2019  4:00 PM Hilty, Nadean Corwin, MD [facility]   Lab/Order associations:   ICD-10-CM   1. Rash  R21     Return precautions advised.  Garret Reddish, MD

## 2018-12-13 NOTE — Patient Instructions (Signed)
Health Maintenance Due  Topic Date Due  . PNA vac Low Risk Adult (2 of 2 - PCV13) 05/28/2006    Depression screen [facility] 2/9 11/07/2017 07/12/2015 03/01/2015  Decreased Interest 2 1 1   Down, Depressed, Hopeless 2 1 0  PHQ - 2 Score 4 2 1   Altered sleeping 3 - -  Tired, decreased energy 3 - -  Change in appetite 0 - -  Feeling bad or failure about yourself  0 - -  Trouble concentrating 1 - -  Moving slowly or fidgety/restless 1 - -  Suicidal thoughts 0 - -  PHQ-9 Score 12 - -  Some recent data might be hidden

## 2018-12-19 ENCOUNTER — Telehealth: Payer: Self-pay | Admitting: Internal Medicine

## 2018-12-19 DIAGNOSIS — N401 Enlarged prostate with lower urinary tract symptoms: Secondary | ICD-10-CM

## 2018-12-19 NOTE — Telephone Encounter (Signed)
He had complained of excessive urination before on 0.4 mg tamsulosin BID - therefore, we switched it back to 0.4 mg daily (see my note on 11/12/2017). That is what he should be taking.  Dr Lemmie Evens

## 2018-12-19 NOTE — Telephone Encounter (Signed)
Spoke with pt who report he is not sure why Dr. Debara Pickett increased his tamsulosin dose to BID as his pcp only prescribed once daily. Pt report he is urinating a lot through out the night and would prefer to decrease dose or take silodosin again. Nurse informed pt that per chart review, on 11/12/17 Dr. Debara Pickett increased dose due to c/o of no significant improvement with medication. Pt state he doesn't remember seeing MD for that reason and pcp won't change it back since Dr. Debara Pickett increased it.  Will route to MD

## 2018-12-19 NOTE — Telephone Encounter (Signed)
Patient states he is having some problems with his medication he would like to talk to the nurse about it.

## 2018-12-22 ENCOUNTER — Telehealth: Payer: Self-pay | Admitting: Internal Medicine

## 2018-12-22 MED ORDER — TAMSULOSIN HCL 0.4 MG PO CAPS: 0.4000 mg | ORAL_CAPSULE | Freq: Every day | ORAL | 11 refills | Status: DC

## 2018-12-22 NOTE — Telephone Encounter (Signed)
Left message to call back  

## 2018-12-22 NOTE — Telephone Encounter (Signed)
See previous encounter

## 2018-12-22 NOTE — Telephone Encounter (Signed)
Pt updated and voiced understanding.  

## 2018-12-22 NOTE — Telephone Encounter (Signed)
New message:    Patient returning your call back. Patient seems to be upset. Please call patient back.

## 2018-12-23 ENCOUNTER — Other Ambulatory Visit: Payer: Self-pay | Admitting: Family Medicine

## 2018-12-23 DIAGNOSIS — L309 Dermatitis, unspecified: Secondary | ICD-10-CM

## 2019-01-03 ENCOUNTER — Other Ambulatory Visit: Payer: Self-pay | Admitting: Internal Medicine

## 2019-01-03 DIAGNOSIS — R351 Nocturia: Secondary | ICD-10-CM

## 2019-01-03 DIAGNOSIS — N401 Enlarged prostate with lower urinary tract symptoms: Secondary | ICD-10-CM

## 2019-01-12 ENCOUNTER — Other Ambulatory Visit: Payer: Self-pay

## 2019-01-12 DIAGNOSIS — N401 Enlarged prostate with lower urinary tract symptoms: Secondary | ICD-10-CM

## 2019-01-12 MED ORDER — TAMSULOSIN HCL 0.4 MG PO CAPS: 0.4000 mg | ORAL_CAPSULE | Freq: Every day | ORAL | 11 refills | Status: DC

## 2019-01-18 ENCOUNTER — Other Ambulatory Visit: Payer: Self-pay | Admitting: Internal Medicine

## 2019-01-21 ENCOUNTER — Other Ambulatory Visit: Payer: Self-pay

## 2019-01-21 ENCOUNTER — Encounter: Payer: Self-pay | Admitting: Internal Medicine

## 2019-01-21 ENCOUNTER — Ambulatory Visit (INDEPENDENT_AMBULATORY_CARE_PROVIDER_SITE_OTHER): Payer: Medicare Other | Admitting: Internal Medicine

## 2019-01-21 VITALS — BP 124/76 | HR 56 | Temp 97.3°F | Ht 72.0 in | Wt 223.0 lb

## 2019-01-21 DIAGNOSIS — R351 Nocturia: Secondary | ICD-10-CM | POA: Diagnosis not present

## 2019-01-21 DIAGNOSIS — I493 Ventricular premature depolarization: Secondary | ICD-10-CM | POA: Diagnosis not present

## 2019-01-21 DIAGNOSIS — I5022 Chronic systolic (congestive) heart failure: Secondary | ICD-10-CM | POA: Diagnosis not present

## 2019-01-21 DIAGNOSIS — I428 Other cardiomyopathies: Secondary | ICD-10-CM

## 2019-01-21 DIAGNOSIS — N401 Enlarged prostate with lower urinary tract symptoms: Secondary | ICD-10-CM

## 2019-01-21 MED ORDER — ACCU-CHEK MULTICLIX LANCETS MISC: 2 refills | Status: DC

## 2019-01-21 NOTE — Patient Instructions (Signed)
Medication Instructions:  Your physician recommends that you continue on your current medications as directed. Please refer to the Current Medication list given to you today.  If you need a refill on your cardiac medications before your next appointment, please call your pharmacy.   Follow-Up: At [facility], you and your health needs are our priority.  As part of our continuing mission to provide you with exceptional heart care, we have created designated Provider Care Teams.  These Care Teams include your primary Cardiologist (physician) and Advanced Practice Providers (APPs -  Physician Assistants and Nurse Practitioners) who all work together to provide you with the care you need, when you need it. You will need a follow up appointment in 6 months.  Please call our office 2 months in advance to schedule this appointment.  You may see Kenneth C Hilty, MD or one of the following Advanced Practice Providers on your designated Care Team: Hao Meng, PA-C . Angela Duke, PA-C  Any Other Special Instructions Will Be Listed Below (If Applicable).    

## 2019-01-21 NOTE — Progress Notes (Signed)
OFFICE NOTE  Chief Complaint:  Follow-up  Primary Care Physician: Tyrone Barrack, MD  HPI:  [name] is a 78 y.o. male he was previously seen by Dr. Percival Spanish last in 2013. He was followed for PVCs at that time and underwent stress testing. There is no evidence for ischemia and no further workup was recommended. Recently was hospitalized for sepsis and was noted to have intermittent SVTs as well as PVCs on telemetry. Although he was not seen by cardiology as an inpatient, we were consult it for outpatient monitor. He was placed on a monitor which I reviewed and is still in progress. The first 24 days of the monitor show frequent PVCs as well as intermittent SVT. During this past hospitalization he had an echocardiogram which shows a newly reduced LVEF of 45-50%, with basal inferior akinesis and inferolateral hypokinesis and grade 2 diastolic dysfunction. This compares to a normal LVF and 2013 by echo. He did have a remote left heart catheterization in 2008 which showed minimal coronary artery disease. He denies any chest pain or worsening shortness of breath. He is having trouble ambulating due to problems with his right ankle. He also reports being under significant stress dealing with his wife who has Parkinson's. He is in the process of moving to friends [place].  01/30/2017  [name] returns today for follow-up. He has had a difficult year. His wife was diagnosed with Parkinson's and ultimately died of congestive heart failure. He developed a diabetic wound and then had the transtibial amputation of the right leg. He was supposed going to friends home but ultimately went to [facility] for rehabilitation and is currently back at home. He does have a leg prostheses. He continues to have SVT in fact has a short RP tachycardia which was caught today in the office. He says is asymptomatic with this. EF is shown to be 45-50% in the past. He did undergo nuclear stress test which was negative for  ischemia prior to his amputation. He denies any chest pain.  09/03/2017  [name] returns today for follow-up.  He has no new complaints today.  He has been struggling with some back pain.  He feels like his right leg prosthesis may be a little short.  He denies any recurrent SVT.  He was placed on the carvedilol for cardiomyopathy with EF 45-50% in 2017 and a blood pressures been well controlled on that.  He denies any chest pain.  He reports occasional shortness of breath sometimes with exertion but also at rest.  10/02/2017  [name] was seen today in follow-up.  He continues to get some care from the [place].  He did report some problems with his right leg prosthesis.  He has not been able to exercise since then.  He gets mild shortness of breath with exertion, consistent with NYHA class II symptoms.  Recent echo was performed to see if he had any improvement in LV function, unfortunately however he had a decline in LVEF to 40 to 45%.  There is additionally some mild to moderate RV dysfunction.  He is only on carvedilol.  I think he would benefit from the addition of Entresto.  Labs last year indicated normal renal function.  11/12/2017  [name] returns today for follow-up.  His main concerns are frequent urination both throughout the day and at night.  He was started on tamsulosin by his PCP which he takes 0.4 mg in the morning.  He does not note  any significant improvement.  I had started him on Entresto however due to frequent urination he discontinued the medication.  He was then switched to lisinopril.  He seems to be tolerating this.  LVEF had declined recently to 40 to 45%.  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  05/12/2018  [name] is seen today in follow-up.  He does report some improvement in his urination.  I increase his dose to 0.8 mg tamsulosin however he said he had excessive urination with this and seems to do better  on the 0.4 mg.  His PCP had switched him to Rapaflo, but he said that was also worse.  He denies any worsening shortness of breath or chest pain.  He unfortunately was intolerant of Entresto due to frequent urination.  His EF had lower down to 40 to 45%.  Hopefully this is all related to his infection and now he is status post amputation and recovering.  He will need a repeat assessment of his EF next summer.  He also is reported some cough.  This might be related to lisinopril.  07/03/2018  [name] is seen today in follow-up of left heart cath.  This was performed for declining LVEF, episodes of chest pain at rest, and history of coronary disease.  He underwent left heart catheterization which demonstrated moderate coronary atherosclerosis with 70% stenosis of a small obtuse marginal, 40% stenosis in the first diagonal, 50% mid LAD stenosis, 40% mid RCA and 60% proximal PDA stenosis.  No significant obstructive coronary disease was identified and LVEDP was normal.  Aggressive secondary risk factor modification was recommended.  He returns today and says he is feeling great.  He says he is not sure what all the work-up was for.  I am concerned that because of his frequency of PVCs that at this point he may also have had a PVC mediated cardiomyopathy.  He was ordered to wear a monitor however he declined.  He does not see the value in that.  Therefore we may be left only with medical therapy.  01/21/2019  [name] returns today for follow-up.  Overall he seems to be doing well.  He is actually in a great mood.  He says he is rekindled a relationship with former girlfriend now 2 years after his wife died.  In addition his energy level is good.  He actually purchased exercise equipment and is now been working out in his house.  He denies any worsening shortness of breath.  He did have a repeat echo in June which showed unfortunately no significant change in LV function at 40 to 45%, however is not worsened.  He  feels like he has had excessive urination on Entresto and I am hesitant to increase the dose further.  We did recommend decreasing his tamsulosin back to 0.4 mg daily.  He also asked about Viagra-I would feel that this is safe for him to use however he should monitor for hypotension.  One other option to consider would be daily Cialis in him if he has some issue with BPH as well.  I will defer this to his PCP as he has an upcoming visit in a few days.  PMHx:  Past Medical History:  Diagnosis Date   CAD (coronary artery disease)    a. Remote cath 2008 for chest pain showed 30% LAD with luminal irregularity and fairly heavy calcification in the mid LAD  without critical stenosis, 30% OM1, 30% acute marginal.   Cancer (HCC)    skin cancer - basal   Cataract    both   Chronic combined systolic and diastolic CHF (congestive heart failure) (HCC)    Complication of anesthesia    Diabetes mellitus    Type II   ED (erectile dysfunction)    History of kidney stones    x1   Hypertension    NICM (nonischemic cardiomyopathy) ([place])    a. EF 40-45% by echo in 08/2017 - prior low risk stress test in 2017.   Osteoarthritis    Paroxysmal SVT (supraventricular tachycardia) (HCC)    PONV (postoperative nausea and vomiting) 1960   with Ether   PVC's (premature ventricular contractions)    PVD (peripheral vascular disease) (HCC)    s/p R BKA   Rheumatic fever    Rhinitis    S/P BKA (below knee amputation), right [facility])     Past Surgical History:  Procedure Laterality Date   AMPUTATION Right 02/29/2016   Procedure: Right Leg AMPUTATION BELOW KNEE with Wound Vac placement;  Surgeon: [name], MD;  Location: [facility];  Service: Orthopedics;  Laterality: Right;   CHOLECYSTECTOMY  1983   colonoscopy  2006, 2010   EYE SURGERY Bilateral    cataract   INGUINAL HERNIA REPAIR Right 1960   LEFT HEART CATH AND CORONARY ANGIOGRAPHY N/A 06/03/2018   Procedure: LEFT HEART CATH AND CORONARY  ANGIOGRAPHY;  Surgeon: Belva Crome, MD;  Location: [place] [facility];  Service: Cardiovascular;  Laterality: N/A;   STUMP REVISION Right 10/30/2017   Procedure: RIGHT BELOW KNEE AMPUTATION REVISION;  Surgeon: [name], MD;  Location: [place];  Service: Orthopedics;  Laterality: Right;    FAMHx:  Family History  Problem Relation Age of Onset   Stroke Father 33       Died age 60   Coronary artery disease Brother 30   Colon cancer Neg Hx    Colon polyps Neg Hx    Diabetes Neg Hx    Esophageal cancer Neg Hx    Kidney disease Neg Hx    Gallbladder disease Neg Hx     SOCHx:   reports that he quit smoking about 37 years ago. He quit after 20.00 years of use. He has never used smokeless tobacco. He reports current alcohol use. He reports that he does not use drugs.  ALLERGIES:  Allergies  Allergen Reactions   Entresto [Sacubitril-Valsartan]     Intolerant due to frequent urination   Lisinopril Cough    ROS: Pertinent items noted in HPI and remainder of comprehensive ROS otherwise negative.  HOME MEDS: Current Outpatient Medications  Medication Sig Dispense Refill   aspirin 325 MG tablet Take 325 mg by mouth daily.     aspirin-sod bicarb-citric acid (ALKA-SELTZER) 325 MG TBEF tablet Take 650 mg by mouth daily as needed (heart burn).     carvedilol (COREG) 25 MG tablet Take 1 tablet (25 mg total) by mouth 2 (two) times daily. 180 tablet 3   ENTRESTO 49-51 MG TAKE 1 TABLET BY MOUTH TWICE A DAY 60 tablet 0   gabapentin (NEURONTIN) 100 MG capsule TAKE 1 CAPSULE BY MOUTH THREE TIMES A DAY 270 capsule 3   glucose blood (ACCU-CHEK AVIVA PLUS) test strip USE TO TEST ONCE DAILY 100 each 11   halobetasol (ULTRAVATE) 0.05 % cream Apply 1 application topically 2 (two) times daily. 50 g 2   hydrocortisone cream 1 %  Apply 1 application topically daily as needed for itching.     Lancets (ACCU-CHEK MULTICLIX) lancets USE TO TEST ONCE DAILY 102 each 2   loratadine  (CLARITIN) 10 MG tablet Take 10 mg by mouth daily as needed for allergies.      LORazepam (ATIVAN) 0.5 MG tablet Take 1 tablet (0.5 mg total) by mouth every 8 (eight) hours as needed for anxiety. 15 tablet 0   metFORMIN (GLUCOPHAGE) 1000 MG tablet Take 1 tablet (1,000 mg total) by mouth 2 (two) times daily. 180 tablet 2   Multiple Vitamin (MULTIVITAMIN) tablet Take 1 tablet by mouth daily.     nystatin cream (MYCOSTATIN) APPLY TO AFFECTED AREA TWICE A DAY 30 g 2   potassium chloride SA (KLOR-CON M20) 20 MEQ tablet TAKE 2 TABLETS IN THE MORNING 180 tablet 3   tamsulosin (FLOMAX) 0.4 MG CAPS capsule Take 1 capsule (0.4 mg total) by mouth daily. 30 capsule 11   Tetrahydrozoline HCl (VISINE OP) Place 1 drop into both eyes daily as needed (dry eyes).     traMADol (ULTRAM) 50 MG tablet Take 1 tablet (50 mg total) by mouth every 6 (six) hours as needed for moderate pain. 60 tablet 0   No current facility-administered medications for this visit.     LABS/IMAGING: No results found for this or any previous visit (from the past 48 hour(s)). No results found.  WEIGHTS: Wt Readings from Last 3 Encounters:  01/21/19 223 lb (101.2 kg)  12/13/18 215 lb (97.5 kg)  07/03/18 225 lb 6.4 oz (102.2 kg)    VITALS: BP 124/76    Pulse (!) 56    Temp (!) 97.3 F (36.3 C)    Ht 6' (1.829 m)    Wt 223 lb (101.2 kg)    SpO2 96%    BMI 30.24 kg/m   EXAM: General appearance: alert, no distress and mildly obese Lungs: clear to auscultation bilaterally Heart: regular rate and rhythm, S1, S2 normal, no murmur, click, rub or gallop Abdomen: soft, non-tender; bowel sounds normal; no masses,  no organomegaly Extremities: Trace left lower extremity edema, status post right BKA Psych: Mildly anxious  EKG: Sinus bradycardia with sinus arrhythmia and PVCs, LVH and 53-personally reviewed  ASSESSMENT: 1. Worsening cardiomyopathy with EF 40-45% (08/2017), NYHA Class II symptoms 2. Recurrent short RP tachycardia   3. Hypertension 4. Type 2 diabetes 5. Right BKA  PLAN: 1.   Mr. Hovey has had a stable LVEF by echo but no significant improvement with the Entresto.  Overall he feels much better though and has been exercising more regularly over the past month.  I encouraged him to use a heart rate monitor to see if his heart rate increases with exercise.  His blood pressures been well controlled.  His blood sugars have improved.  He has follow-up with his PCP in a few days.  No other changes were made today.  Follow-up with me in 6 months  Pixie Casino, MD, [facility], [place] Director of the Advanced Lipid Disorders &  Cardiovascular Risk Reduction Clinic Diplomate of the American Board of Clinical Lipidology Attending Cardiologist  Direct Dial: [phone]   Fax: [phone]  Website:  [email or web address] 01/21/2019, 4:19 PM

## 2019-01-27 ENCOUNTER — Other Ambulatory Visit: Payer: Self-pay

## 2019-01-27 ENCOUNTER — Encounter: Payer: Self-pay | Admitting: Family Medicine

## 2019-01-27 ENCOUNTER — Ambulatory Visit (INDEPENDENT_AMBULATORY_CARE_PROVIDER_SITE_OTHER): Payer: Medicare Other | Admitting: Family Medicine

## 2019-01-27 VITALS — BP 124/64 | HR 58 | Temp 98.0°F | Ht 72.0 in | Wt 224.4 lb

## 2019-01-27 DIAGNOSIS — R739 Hyperglycemia, unspecified: Secondary | ICD-10-CM

## 2019-01-27 DIAGNOSIS — N401 Enlarged prostate with lower urinary tract symptoms: Secondary | ICD-10-CM

## 2019-01-27 DIAGNOSIS — R350 Frequency of micturition: Secondary | ICD-10-CM

## 2019-01-27 DIAGNOSIS — G546 Phantom limb syndrome with pain: Secondary | ICD-10-CM | POA: Diagnosis not present

## 2019-01-27 DIAGNOSIS — I10 Essential (primary) hypertension: Secondary | ICD-10-CM

## 2019-01-27 DIAGNOSIS — R351 Nocturia: Secondary | ICD-10-CM

## 2019-01-27 DIAGNOSIS — Z23 Encounter for immunization: Secondary | ICD-10-CM

## 2019-01-27 LAB — POC URINALSYSI DIPSTICK (AUTOMATED)
Bilirubin, UA: NEGATIVE
Blood, UA: NEGATIVE
Glucose, UA: NEGATIVE
Ketones, UA: NEGATIVE
Leukocytes, UA: NEGATIVE
Nitrite, UA: NEGATIVE
Protein, UA: NEGATIVE
Spec Grav, UA: 1.01 (ref 1.010–1.025)
Urobilinogen, UA: 0.2 E.U./dL
pH, UA: 7.5 (ref 5.0–8.0)

## 2019-01-27 MED ORDER — TADALAFIL 10 MG PO TABS: 5.0000 mg | ORAL_TABLET | Freq: Every day | ORAL | 5 refills | Status: DC | PRN

## 2019-01-27 NOTE — Assessment & Plan Note (Signed)
Reported blood sugars are at goal.  He will follow-up in a few months for annual visit with blood work.  Check A1c at that time.  If A1c continues to be well controlled, would consider stopping metformin.  He will continue working on diet and exercise as well.

## 2019-01-27 NOTE — Patient Instructions (Signed)
It was very nice to see you today!  Please try the cialis.  No other changes today.  Come back to see me early next year for your next visit with blood work.  Come back to see me sooner if needed.   Take care, Dr Jerline Pain  Please try these tips to maintain a healthy lifestyle:   Eat at least 3 REAL meals and 1-2 snacks per day.  Aim for no more than 5 hours between eating.  If you eat breakfast, please do so within one hour of getting up.    Obtain twice as many fruits/vegetables as protein or carbohydrate foods for both lunch and dinner. (Half of each meal should be fruits/vegetables, one quarter protein, and one quarter starchy carbs)   Cut down on sweet beverages. This includes juice, soda, and sweet tea.    Exercise at least 150 minutes every week.

## 2019-01-27 NOTE — Assessment & Plan Note (Signed)
Continue Flomax 0.4 mg daily.  We will also start Cialis 5 mg daily.  Can increase to 10 mg daily if tolerates well.  Discussed potential side effects.  Follow-up with me in a few months.

## 2019-01-27 NOTE — Assessment & Plan Note (Signed)
At goal.  Continue Coreg 25 mg twice daily and losartan 50 mg daily.

## 2019-01-27 NOTE — Assessment & Plan Note (Signed)
We will try off gabapentin.  If has recurrence of pain, will restart.

## 2019-01-27 NOTE — Progress Notes (Signed)
   Chief Complaint:  [name] is a 78 y.o. male who presents today with a chief complaint of Hyperglycemia follow up.   Assessment/Plan:  Essential hypertension At goal.  Continue Coreg 25 mg twice daily and losartan 50 mg daily.  Hyperglycemia Reported blood sugars are at goal.  He will follow-up in a few months for annual visit with blood work.  Check A1c at that time.  If A1c continues to be well controlled, would consider stopping metformin.  He will continue working on diet and exercise as well.  Benign prostatic hyperplasia with nocturia Continue Flomax 0.4 mg daily.  We will also start Cialis 5 mg daily.  Can increase to 10 mg daily if tolerates well.  Discussed potential side effects.  Follow-up with me in a few months.  Phantom limb pain ([place]) We will try off gabapentin.  If has recurrence of pain, will restart.  Preventative Healthcare Flu vaccine given today.     Subjective:  HPI:  His stable, chronic medical conditions are outlined below:  # Hyperglycemia -Currently on metformin 1000 mg twice daily. Tolerating well with side effects.  - Sugars usually in upper 90s  # Chronic low back pain / Phantom limb pain s/p right BKA -Currently on gabapentin 100 mg twice daily, tramadol 50mg  every 8 hours as needed.  Tolerating well.  # HTN -Currently on Coreg 25 twice daily and losartan 50 mg daily.  Tolerating well. - No chest pain or shortness of breath.  # BPH - On flomax 0.4mg  daily and tolerating well  % NICM / SVT / AVNRT - Follows with cardiology    ROS: Per HPI  PMH: He reports that he quit smoking about 37 years ago. He quit after 20.00 years of use. He has never used smokeless tobacco. He reports current alcohol use. He reports that he does not use drugs.      Objective:  Physical Exam: BP 124/64   Pulse (!) 58   Temp 98 F (36.7 C)   Ht 6' (1.829 m)   Wt 224 lb 6.1 oz (101.8 kg)   SpO2 99%   BMI 30.43 kg/m   Gen: NAD, resting comfortably  CV: Regular rate and rhythm with no murmurs appreciated Pulm: Normal work of breathing, clear to auscultation bilaterally with no crackles, wheezes, or rhonchi  Results for orders placed or performed in visit on 01/27/19 (from the past 24 hour(s))  POCT Urinalysis Dipstick (Automated)     Status: None   Collection Time: 01/27/19  2:12 PM  Result Value Ref Range   Color, UA Yellow    Clarity, UA Clear    Glucose, UA Negative Negative   Bilirubin, UA Negative    Ketones, UA Negative    Spec Grav, UA 1.010 1.010 - 1.025   Blood, UA Negative    pH, UA 7.5 5.0 - 8.0   Protein, UA Negative Negative   Urobilinogen, UA 0.2 0.2 or 1.0 E.U./dL   Nitrite, UA Negative    Leukocytes, UA Negative Negative        Vitali Seibert M. Jerline Pain, MD 01/27/2019 2:40 PM

## 2019-01-27 NOTE — Progress Notes (Signed)
flu

## 2019-01-29 LAB — URINE CULTURE
MICRO NUMBER:: 835677
Result:: NO GROWTH
SPECIMEN QUALITY:: ADEQUATE

## 2019-01-29 NOTE — Progress Notes (Signed)
Please inform patient of the following:  Urine culture negative - He definitively does not have a UTI. Would like for him to continue the cialis as we discussed and let us know if symptoms are not improving.  Algis Greenhouse. Jerline Pain, MD 01/29/2019 7:53 AM

## 2019-02-21 ENCOUNTER — Other Ambulatory Visit: Payer: Self-pay | Admitting: Internal Medicine

## 2019-03-01 ENCOUNTER — Other Ambulatory Visit: Payer: Self-pay | Admitting: Family Medicine

## 2019-03-01 DIAGNOSIS — L309 Dermatitis, unspecified: Secondary | ICD-10-CM

## 2019-03-04 ENCOUNTER — Telehealth: Payer: Self-pay

## 2019-03-04 NOTE — Telephone Encounter (Signed)
Copied from [place] [id number]. Topic: General - Call Back - No Documentation >> Mar 04, 2019  4:08 PM Erick Blinks wrote: Best contact [phone] tadalafil (CIALIS) 10 MG tablet

## 2019-03-05 NOTE — Telephone Encounter (Signed)
Notified patient via voice message that he has refills on file and to call the pharmacy.

## 2019-03-17 ENCOUNTER — Telehealth: Payer: Self-pay | Admitting: Family Medicine

## 2019-03-17 NOTE — Telephone Encounter (Signed)
See note  Copied from [place] [id number]. Topic: General - Inquiry >> Mar 17, 2019 10:49 AM Alease Frame wrote: Reason for [id number] would like to speak with Dr Ellwood Handler nurse concerning his medication  Call back [phone]

## 2019-03-17 NOTE — Telephone Encounter (Signed)
Patient requesting Viagra due to having side effect of Cialis causing a upset stomach an red spots on his face.Patient has stopped taking Rx and symptoms has resolved. Please advise

## 2019-03-17 NOTE — Telephone Encounter (Signed)
Ok to send in viagra 100mg  daily as needed.   Algis Greenhouse. Jerline Pain, MD 03/17/2019 3:39 PM

## 2019-03-18 ENCOUNTER — Other Ambulatory Visit: Payer: Self-pay

## 2019-03-18 MED ORDER — SILDENAFIL CITRATE 100 MG PO TABS: 100.0000 mg | ORAL_TABLET | ORAL | 3 refills | Status: DC | PRN

## 2019-03-18 NOTE — Telephone Encounter (Signed)
Patient will pick up Rx.

## 2019-04-28 ENCOUNTER — Other Ambulatory Visit: Payer: Self-pay

## 2019-04-28 ENCOUNTER — Telehealth: Payer: Self-pay | Admitting: Internal Medicine

## 2019-04-28 MED ORDER — ENTRESTO 49-51 MG PO TABS: 1.0000 | ORAL_TABLET | Freq: Two times a day (BID) | ORAL | 1 refills | Status: DC

## 2019-04-28 NOTE — Telephone Encounter (Signed)
Called patient back- he states that right now he only does at 30 day supply, and maybe if we called in a 90 day it would be better. I advised I did not know if this would work, but we could try.   He would also like information on patient assistance, and a possible coupon card- I advised that I could send a message to Dr.Hilty's primary nurse and make her aware to set it up front at the office.  Patient verbalized understanding, thankful for call back

## 2019-04-28 NOTE — Telephone Encounter (Signed)
New Message:   Pt said he would like to talk to you about his Entresto. He says it is too expensive.He said he will need something else or maybe it will be less expensive to get it for 3 months.

## 2019-04-29 NOTE — Telephone Encounter (Signed)
Spoke with patient. Explained that we can send patient assistance application but he does not think he will qualify as he reports he tried to apply when he was first started on medication. Notified him that [facility] LPN sent in for 90 day supply of entresto yesterday. He will check on cost. No further assistance needed at this time

## 2019-06-11 DIAGNOSIS — Z23 Encounter for immunization: Secondary | ICD-10-CM | POA: Diagnosis not present

## 2019-06-12 ENCOUNTER — Telehealth: Payer: Self-pay | Admitting: Family Medicine

## 2019-06-12 NOTE — Telephone Encounter (Signed)
I called the patient to schedule AWV with Loma Sousa ([place]), but there was no answer and no option to leave a message. If patient calls back, please schedule Medicare Wellness Visit at next available opening. Last AWV 09/21/14 VDM ([place])

## 2019-06-25 ENCOUNTER — Other Ambulatory Visit: Payer: Self-pay | Admitting: Family Medicine

## 2019-06-25 NOTE — Telephone Encounter (Signed)
Last OV 01/27/19 Last refill 09/25/18 #180/2 Next OV not scheduled

## 2019-07-10 DIAGNOSIS — Z23 Encounter for immunization: Secondary | ICD-10-CM | POA: Diagnosis not present

## 2019-07-24 ENCOUNTER — Other Ambulatory Visit: Payer: Self-pay | Admitting: Family Medicine

## 2019-07-25 ENCOUNTER — Other Ambulatory Visit: Payer: Self-pay | Admitting: Internal Medicine

## 2019-07-27 ENCOUNTER — Telehealth: Payer: Self-pay

## 2019-07-27 ENCOUNTER — Other Ambulatory Visit: Payer: Self-pay

## 2019-07-27 ENCOUNTER — Ambulatory Visit (INDEPENDENT_AMBULATORY_CARE_PROVIDER_SITE_OTHER): Payer: Medicare Other | Admitting: Internal Medicine

## 2019-07-27 ENCOUNTER — Encounter: Payer: Self-pay | Admitting: Internal Medicine

## 2019-07-27 VITALS — BP 123/73 | HR 78 | Temp 98.2°F | Ht 72.0 in | Wt 222.6 lb

## 2019-07-27 DIAGNOSIS — I10 Essential (primary) hypertension: Secondary | ICD-10-CM | POA: Diagnosis not present

## 2019-07-27 DIAGNOSIS — I428 Other cardiomyopathies: Secondary | ICD-10-CM

## 2019-07-27 DIAGNOSIS — R351 Nocturia: Secondary | ICD-10-CM

## 2019-07-27 DIAGNOSIS — N401 Enlarged prostate with lower urinary tract symptoms: Secondary | ICD-10-CM

## 2019-07-27 DIAGNOSIS — I493 Ventricular premature depolarization: Secondary | ICD-10-CM

## 2019-07-27 MED ORDER — ACCU-CHEK AVIVA PLUS VI STRP: ORAL_STRIP | 11 refills | Status: DC

## 2019-07-27 NOTE — Telephone Encounter (Signed)
Rx resent.

## 2019-07-27 NOTE — Patient Instructions (Signed)
Medication Instructions:  Your physician recommends that you continue on your current medications as directed. Please refer to the Current Medication list given to you today.  *If you need a refill on your cardiac medications before your next appointment, please call your pharmacy*  Follow-Up: At [facility], you and your health needs are our priority.  As part of our continuing mission to provide you with exceptional heart care, we have created designated Provider Care Teams.  These Care Teams include your primary Cardiologist (physician) and Advanced Practice Providers (APPs -  Physician Assistants and Nurse Practitioners) who all work together to provide you with the care you need, when you need it.  We recommend signing up for the patient portal called "MyChart".  Sign up information is provided on this After Visit Summary.  MyChart is used to connect with patients for Virtual Visits (Telemedicine).  Patients are able to view lab/test results, encounter notes, upcoming appointments, etc.  Non-urgent messages can be sent to your provider as well.   To learn more about what you can do with MyChart, go to [email or web address].    Your next appointment:   6 months   The format for your next appointment:   In Person  Provider:   Dr. Debara Pickett   Other Instructions

## 2019-07-27 NOTE — Progress Notes (Signed)
OFFICE NOTE  Chief Complaint:  Follow-up  Primary Care Physician: Vivi Barrack, MD  HPI:  [name] is a 79 y.o. male he was previously seen by Dr. Percival Spanish last in 2013. He was followed for PVCs at that time and underwent stress testing. There is no evidence for ischemia and no further workup was recommended. Recently was hospitalized for sepsis and was noted to have intermittent SVTs as well as PVCs on telemetry. Although he was not seen by cardiology as an inpatient, we were consult it for outpatient monitor. He was placed on a monitor which I reviewed and is still in progress. The first 24 days of the monitor show frequent PVCs as well as intermittent SVT. During this past hospitalization he had an echocardiogram which shows a newly reduced LVEF of 45-50%, with basal inferior akinesis and inferolateral hypokinesis and grade 2 diastolic dysfunction. This compares to a normal LVF and 2013 by echo. He did have a remote left heart catheterization in 2008 which showed minimal coronary artery disease. He denies any chest pain or worsening shortness of breath. He is having trouble ambulating due to problems with his right ankle. He also reports being under significant stress dealing with his wife who has Parkinson's. He is in the process of moving to friends [place].  01/30/2017  [name] returns today for follow-up. He has had a difficult year. His wife was diagnosed with Parkinson's and ultimately died of congestive heart failure. He developed a diabetic wound and then had the transtibial amputation of the right leg. He was supposed going to friends home but ultimately went to [facility] for rehabilitation and is currently back at home. He does have a leg prostheses. He continues to have SVT in fact has a short RP tachycardia which was caught today in the office. He says is asymptomatic with this. EF is shown to be 45-50% in the past. He did undergo nuclear stress test which was negative for  ischemia prior to his amputation. He denies any chest pain.  09/03/2017  [name] returns today for follow-up.  He has no new complaints today.  He has been struggling with some back pain.  He feels like his right leg prosthesis may be a little short.  He denies any recurrent SVT.  He was placed on the carvedilol for cardiomyopathy with EF 45-50% in 2017 and a blood pressures been well controlled on that.  He denies any chest pain.  He reports occasional shortness of breath sometimes with exertion but also at rest.  10/02/2017  [name] was seen today in follow-up.  He continues to get some care from the [place].  He did report some problems with his right leg prosthesis.  He has not been able to exercise since then.  He gets mild shortness of breath with exertion, consistent with NYHA class II symptoms.  Recent echo was performed to see if he had any improvement in LV function, unfortunately however he had a decline in LVEF to 40 to 45%.  There is additionally some mild to moderate RV dysfunction.  He is only on carvedilol.  I think he would benefit from the addition of Entresto.  Labs last year indicated normal renal function.  11/12/2017  [name] returns today for follow-up.  His main concerns are frequent urination both throughout the day and at night.  He was started on tamsulosin by his PCP which he takes 0.4 mg in the morning.  He does not note  any significant improvement.  I had started him on Entresto however due to frequent urination he discontinued the medication.  He was then switched to lisinopril.  He seems to be tolerating this.  LVEF had declined recently to 40 to 45%.  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  05/12/2018  [name] is seen today in follow-up.  He does report some improvement in his urination.  I increase his dose to 0.8 mg tamsulosin however he said he had excessive urination with this and seems to do better  on the 0.4 mg.  His PCP had switched him to Rapaflo, but he said that was also worse.  He denies any worsening shortness of breath or chest pain.  He unfortunately was intolerant of Entresto due to frequent urination.  His EF had lower down to 40 to 45%.  Hopefully this is all related to his infection and now he is status post amputation and recovering.  He will need a repeat assessment of his EF next summer.  He also is reported some cough.  This might be related to lisinopril.  07/03/2018  [name] is seen today in follow-up of left heart cath.  This was performed for declining LVEF, episodes of chest pain at rest, and history of coronary disease.  He underwent left heart catheterization which demonstrated moderate coronary atherosclerosis with 70% stenosis of a small obtuse marginal, 40% stenosis in the first diagonal, 50% mid LAD stenosis, 40% mid RCA and 60% proximal PDA stenosis.  No significant obstructive coronary disease was identified and LVEDP was normal.  Aggressive secondary risk factor modification was recommended.  He returns today and says he is feeling great.  He says he is not sure what all the work-up was for.  I am concerned that because of his frequency of PVCs that at this point he may also have had a PVC mediated cardiomyopathy.  He was ordered to wear a monitor however he declined.  He does not see the value in that.  Therefore we may be left only with medical therapy.  01/21/2019  [name] returns today for follow-up.  Overall he seems to be doing well.  He is actually in a great mood.  He says he is rekindled a relationship with former girlfriend now 2 years after his wife died.  In addition his energy level is good.  He actually purchased exercise equipment and is now been working out in his house.  He denies any worsening shortness of breath.  He did have a repeat echo in June which showed unfortunately no significant change in LV function at 40 to 45%, however is not worsened.  He  feels like he has had excessive urination on Entresto and I am hesitant to increase the dose further.  We did recommend decreasing his tamsulosin back to 0.4 mg daily.  He also asked about Viagra-I would feel that this is safe for him to use however he should monitor for hypotension.  One other option to consider would be daily Cialis in him if he has some issue with BPH as well.  I will defer this to his PCP as he has an upcoming visit in a few days.  07/27/2019  [name] is seen today in follow-up.  He continues to report feeling fairly well.  He like to get back to playing some golf.  His echo shows stable LVEF of 40 to 45%.  He remains on Entresto but notes that because of excessive urination I could not increase his Entresto any further.  Blood pressures well controlled.  Denies any chest pain or worsening shortness of breath.  PMHx:  Past Medical History:  Diagnosis Date  . CAD (coronary artery disease)    a. Remote cath 2008 for chest pain showed 30% LAD with luminal irregularity and fairly heavy calcification in the mid LAD without critical stenosis, 30% OM1, 30% acute marginal.  . Cancer (HCC)    skin cancer - basal  . Cataract    both  . Chronic combined systolic and diastolic CHF (congestive heart failure) ([place])   . Complication of anesthesia   . Diabetes mellitus    Type II  . ED (erectile dysfunction)   . History of kidney stones    x1  . Hypertension   . NICM (nonischemic cardiomyopathy) ([place])    a. EF 40-45% by echo in 08/2017 - prior low risk stress test in 2017.  . Osteoarthritis   . Paroxysmal SVT (supraventricular tachycardia) ([place])   . PONV (postoperative nausea and vomiting) 1960   with Ether  . PVC's (premature ventricular contractions)   . PVD (peripheral vascular disease) ([place])    s/p R BKA  . Rheumatic fever   . Rhinitis   . S/P BKA (below knee amputation), right [facility])     Past Surgical History:  Procedure Laterality Date  . AMPUTATION Right 02/29/2016    Procedure: Right Leg AMPUTATION BELOW KNEE with Wound Vac placement;  Surgeon: [name], MD;  Location: [facility];  Service: Orthopedics;  Laterality: Right;  . CHOLECYSTECTOMY  1983  . colonoscopy  2006, 2010  . EYE SURGERY Bilateral    cataract  . INGUINAL HERNIA REPAIR Right 1960  . LEFT HEART CATH AND CORONARY ANGIOGRAPHY N/A 06/03/2018   Procedure: LEFT HEART CATH AND CORONARY ANGIOGRAPHY;  Surgeon: Belva Crome, MD;  Location: [facility];  Service: Cardiovascular;  Laterality: N/A;  . STUMP REVISION Right 10/30/2017   Procedure: RIGHT BELOW KNEE AMPUTATION REVISION;  Surgeon: [name], MD;  Location: [facility];  Service: Orthopedics;  Laterality: Right;    FAMHx:  Family History  Problem Relation Age of Onset  . Stroke Father 2       Died age 79  . Coronary artery disease Brother 60  . Colon cancer Neg Hx   . Colon polyps Neg Hx   . Diabetes Neg Hx   . Esophageal cancer Neg Hx   . Kidney disease Neg Hx   . Gallbladder disease Neg Hx     SOCHx:   reports that he quit smoking about 38 years ago. He quit after 20.00 years of use. He has never used smokeless tobacco. He reports current alcohol use. He reports that he does not use drugs.  ALLERGIES:  Allergies  Allergen Reactions  . Entresto [Sacubitril-Valsartan]     Intolerant due to frequent urination  . Lisinopril Cough    ROS: Pertinent items noted in HPI and remainder of comprehensive ROS otherwise negative.  HOME MEDS: Current Outpatient Medications  Medication Sig Dispense Refill  . ACCU-CHEK AVIVA PLUS test strip USE TO TEST ONCE DAILY 100 strip 11  . aspirin 325 MG tablet Take 325 mg by mouth daily.    Marland Kitchen aspirin-sod bicarb-citric acid (ALKA-SELTZER) 325 MG TBEF tablet Take 650 mg by mouth daily as needed (heart burn).    . carvedilol (COREG) 25 MG tablet Take 1 tablet (  25 mg total) by mouth 2 (two) times daily. 180 tablet 3  . gabapentin (NEURONTIN) 100 MG capsule TAKE 1 CAPSULE BY MOUTH THREE TIMES  A DAY    . halobetasol (ULTRAVATE) 0.05 % cream APPLY TO AFFECTED AREA TWICE A DAY 50 g 2  . hydrocortisone cream 1 % Apply 1 application topically daily as needed for itching.    . Lancets (ACCU-CHEK MULTICLIX) lancets USE TO TEST ONCE DAILY 102 each 2  . loratadine (CLARITIN) 10 MG tablet Take 10 mg by mouth daily as needed for allergies.     Marland Kitchen LORazepam (ATIVAN) 0.5 MG tablet Take 1 tablet (0.5 mg total) by mouth every 8 (eight) hours as needed for anxiety. 15 tablet 0  . metFORMIN (GLUCOPHAGE) 1000 MG tablet TAKE 1 TABLET BY MOUTH TWICE A DAY 180 tablet 0  . Multiple Vitamin (MULTIVITAMIN) tablet Take 1 tablet by mouth daily.    Marland Kitchen nystatin cream (MYCOSTATIN) APPLY TO AFFECTED AREA TWICE A DAY 30 g 2  . potassium chloride SA (KLOR-CON M20) 20 MEQ tablet TAKE 2 TABLETS IN THE MORNING 180 tablet 3  . sacubitril-valsartan (ENTRESTO) 49-51 MG Take 1 tablet by mouth 2 (two) times daily. 180 tablet 1  . tadalafil (CIALIS) 10 MG tablet Take 0.5-1 tablets (5-10 mg total) by mouth daily as needed for erectile dysfunction. 30 tablet 5  . tamsulosin (FLOMAX) 0.4 MG CAPS capsule Take 1 capsule (0.4 mg total) by mouth daily. 30 capsule 11  . Tetrahydrozoline HCl (VISINE OP) Place 1 drop into both eyes daily as needed (dry eyes).    . traMADol (ULTRAM) 50 MG tablet Take 1 tablet (50 mg total) by mouth every 6 (six) hours as needed for moderate pain. 60 tablet 0  . sildenafil (VIAGRA) 100 MG tablet Take 1 tablet (100 mg total) by mouth as needed for erectile dysfunction. 30 tablet 3   No current facility-administered medications for this visit.    LABS/IMAGING: No results found for this or any previous visit (from the past 48 hour(s)). No results found.  WEIGHTS: Wt Readings from Last 3 Encounters:  07/27/19 222 lb 9.6 oz (101 kg)  01/27/19 224 lb 6.1 oz (101.8 kg)  01/21/19 223 lb (101.2 kg)    VITALS: BP 123/73   Pulse 78   Temp 98.2 F (36.8 C)   Ht 6' (1.829 m)   Wt 222 lb 9.6 oz (101  kg)   SpO2 95%   BMI 30.19 kg/m   EXAM: General appearance: alert, no distress and mildly obese Lungs: clear to auscultation bilaterally Heart: regular rate and rhythm, S1, S2 normal, no murmur, click, rub or gallop Abdomen: soft, non-tender; bowel sounds normal; no masses,  no organomegaly Extremities: Trace left lower extremity edema, status post right BKA Psych: Mildly anxious  EKG: Normal sinus rhythm 79, minimal voltage criteria for LVH-personally reviewed  ASSESSMENT: 1. Non-ischemic cardiomyopathy with EF 40-45% (08/2017), NYHA Class II symptoms 2. Recurrent short RP tachycardia  3. Hypertension 4. Type 2 diabetes 5. Right BKA  PLAN: 1.   Mr. Hoffecker seems to be stable with no more than NYHA class II symptoms.  His EF is unchanged.  Blood pressure is at goal.  His diabetes is much better with fasting blood sugars below 100 in the morning.  He is status post right BKA but ambulating better and hopes to relearn to play golf left-handed.  Follow-up with me in 6 months  Pixie Casino, MD, FACC, [facility]  Medical Director of the [facility] Diplomate of the American Board of Clinical Lipidology Attending Cardiologist  Direct Dial: [phone]  Fax: [phone]  Website:  [email or web address] [email or web address] 07/27/2019, 11:20 AM

## 2019-07-27 NOTE — Telephone Encounter (Signed)
  LAST APPOINTMENT DATE: 07/24/2019   NEXT APPOINTMENT DATE:@Visit  date not found  [place] test strip  PHARMACY: [facility] [place], [place] - [address] Phone:  [phone]  Fax:  [phone]      Patient states that pharmacy will not fill prescription unless they have a code. Please reach out to the pharmacy  **Let patient know to contact pharmacy at the end of the day to make sure medication is ready. **  ** Please notify patient to allow 48-72 hours to process**  **Encourage patient to contact the pharmacy for refills or they can request refills through [facility]**  CLINICAL FILLS OUT ALL BELOW:   LAST REFILL:  QTY:  REFILL DATE:    OTHER COMMENTS:    Okay for refill?  Please advise

## 2019-07-28 ENCOUNTER — Other Ambulatory Visit: Payer: Self-pay | Admitting: Internal Medicine

## 2019-07-30 ENCOUNTER — Other Ambulatory Visit: Payer: Self-pay

## 2019-08-06 ENCOUNTER — Telehealth: Payer: Self-pay | Admitting: Family Medicine

## 2019-08-06 NOTE — Telephone Encounter (Signed)
I left a message asking the patient to call and schedule Medicare AWV with Courtney ([facility]).  If patient calls back, please schedule Medicare Wellness Visit (initial) at next available opening.  VDM (Dee-Dee) 

## 2019-08-14 DIAGNOSIS — Z961 Presence of intraocular lens: Secondary | ICD-10-CM | POA: Diagnosis not present

## 2019-08-14 DIAGNOSIS — H52203 Unspecified astigmatism, bilateral: Secondary | ICD-10-CM | POA: Diagnosis not present

## 2019-08-14 DIAGNOSIS — E119 Type 2 diabetes mellitus without complications: Secondary | ICD-10-CM | POA: Diagnosis not present

## 2019-08-14 DIAGNOSIS — H26491 Other secondary cataract, right eye: Secondary | ICD-10-CM | POA: Diagnosis not present

## 2019-08-14 LAB — HM DIABETES EYE EXAM

## 2019-08-17 ENCOUNTER — Encounter: Payer: Self-pay | Admitting: Family Medicine

## 2019-09-04 ENCOUNTER — Ambulatory Visit (INDEPENDENT_AMBULATORY_CARE_PROVIDER_SITE_OTHER): Payer: Medicare Other | Admitting: Family Medicine

## 2019-09-04 ENCOUNTER — Encounter: Payer: Self-pay | Admitting: Family Medicine

## 2019-09-04 ENCOUNTER — Other Ambulatory Visit: Payer: Self-pay

## 2019-09-04 VITALS — BP 120/78 | HR 55 | Temp 98.1°F | Ht 72.0 in | Wt 223.8 lb

## 2019-09-04 DIAGNOSIS — Z125 Encounter for screening for malignant neoplasm of prostate: Secondary | ICD-10-CM | POA: Diagnosis not present

## 2019-09-04 DIAGNOSIS — F528 Other sexual dysfunction not due to a substance or known physiological condition: Secondary | ICD-10-CM

## 2019-09-04 DIAGNOSIS — I10 Essential (primary) hypertension: Secondary | ICD-10-CM | POA: Diagnosis not present

## 2019-09-04 DIAGNOSIS — N401 Enlarged prostate with lower urinary tract symptoms: Secondary | ICD-10-CM | POA: Diagnosis not present

## 2019-09-04 DIAGNOSIS — R351 Nocturia: Secondary | ICD-10-CM

## 2019-09-04 DIAGNOSIS — R739 Hyperglycemia, unspecified: Secondary | ICD-10-CM | POA: Diagnosis not present

## 2019-09-04 DIAGNOSIS — E785 Hyperlipidemia, unspecified: Secondary | ICD-10-CM | POA: Diagnosis not present

## 2019-09-04 LAB — COMPREHENSIVE METABOLIC PANEL
ALT: 16 U/L (ref 0–53)
AST: 14 U/L (ref 0–37)
Albumin: 4.3 g/dL (ref 3.5–5.2)
Alkaline Phosphatase: 58 U/L (ref 39–117)
BUN: 17 mg/dL (ref 6–23)
CO2: 28 mEq/L (ref 19–32)
Calcium: 9.3 mg/dL (ref 8.4–10.5)
Chloride: 103 mEq/L (ref 96–112)
Creatinine, Ser: 0.68 mg/dL (ref 0.40–1.50)
GFR: 112.24 mL/min (ref >60.00)
Glucose, Bld: 100 mg/dL — ABNORMAL HIGH (ref 70–99)
Potassium: 4.6 mEq/L (ref 3.5–5.1)
Sodium: 140 mEq/L (ref 135–145)
Total Bilirubin: 0.8 mg/dL (ref 0.2–1.2)
Total Protein: 6.4 g/dL (ref 6.0–8.3)

## 2019-09-04 LAB — LIPID PANEL
Cholesterol: 148 mg/dL (ref 0–200)
HDL: 36 mg/dL — ABNORMAL LOW (ref >39.00)
LDL Cholesterol: 93 mg/dL (ref 0–99)
NonHDL: 111.69
Total CHOL/HDL Ratio: 4
Triglycerides: 94 mg/dL (ref 0.0–149.0)
VLDL: 18.8 mg/dL (ref 0.0–40.0)

## 2019-09-04 LAB — HEMOGLOBIN A1C: Hgb A1c MFr Bld: 5.8 % (ref 4.6–6.5)

## 2019-09-04 LAB — TSH: TSH: 1.92 u[IU]/mL (ref 0.35–4.50)

## 2019-09-04 LAB — CBC
HCT: 40.2 % (ref 39.0–52.0)
Hemoglobin: 13.8 g/dL (ref 13.0–17.0)
MCHC: 34.2 g/dL (ref 30.0–36.0)
MCV: 88.7 fl (ref 78.0–100.0)
Platelets: 204 10*3/uL (ref 150.0–400.0)
RBC: 4.53 Mil/uL (ref 4.22–5.81)
RDW: 13.1 % (ref 11.5–15.5)
WBC: 6.7 10*3/uL (ref 4.0–10.5)

## 2019-09-04 LAB — PSA, MEDICARE: PSA: 0.63 ng/ml (ref 0.10–4.00)

## 2019-09-04 NOTE — Assessment & Plan Note (Signed)
Has not had much success with Viagra or Cialis.  Will place referral to urology.

## 2019-09-04 NOTE — Assessment & Plan Note (Signed)
Check A1c.  Pending results will likely be able to stop Metformin.  Also check CBC, C met, TSH.

## 2019-09-04 NOTE — Assessment & Plan Note (Signed)
At goal.  Continue Coreg 25 mg daily and Entresto per cardiology.

## 2019-09-04 NOTE — Progress Notes (Signed)
Please inform patient of the following:  Blood work is all STABLE. His A1c looks great - ok with cutting metformin to 1000mg  daily. Would like for him to come back in 6 months to recheck A1c.

## 2019-09-04 NOTE — Progress Notes (Signed)
   JON LALL is a 79 y.o. male who presents today for an office visit.  Assessment/Plan:  Chronic Problems Addressed Today: Essential hypertension At goal.  Continue Coreg 25 mg daily and Entresto per cardiology.  ERECTILE DYSFUNCTION Has not had much success with Viagra or Cialis.  Will place referral to urology.  Hyperglycemia Check A1c.  Pending results will likely be able to stop Metformin.  Also check CBC, C met, TSH.  Benign prostatic hyperplasia with nocturia Will place referral to urology.  Continue Flomax 0.4 mg daily.     Subjective:  HPI:  See A/p.         Objective:  Physical Exam: BP 120/78 (BP Location: Left Arm, Patient Position: Sitting, Cuff Size: Large)   Pulse (!) 55   Temp 98.1 F (36.7 C)   Ht 6' (1.829 m)   Wt 223 lb 12.8 oz (101.5 kg)   SpO2 97%   BMI 30.35 kg/m   Gen: No acute distress, resting comfortably CV: Regular rate and rhythm with no murmurs appreciated Pulm: Normal work of breathing, clear to auscultation bilaterally with no crackles, wheezes, or rhonchi Neuro: Grossly normal, moves all extremities Psych: Normal affect and thought content      Shalona Harbour M. Jerline Pain, MD 09/04/2019 9:35 AM

## 2019-09-04 NOTE — Patient Instructions (Signed)
It was very nice to see you today!  We will check blood work today.  Also place a referral for you to see the urologist.  Please come back to see me in 6 months for your next check up.   Take care, Dr Jerline Pain  Please try these tips to maintain a healthy lifestyle:   Eat at least 3 REAL meals and 1-2 snacks per day.  Aim for no more than 5 hours between eating.  If you eat breakfast, please do so within one hour of getting up.    Each meal should contain half fruits/vegetables, one quarter protein, and one quarter carbs (no bigger than a computer mouse)   Cut down on sweet beverages. This includes juice, soda, and sweet tea.     Drink at least 1 glass of water with each meal and aim for at least 8 glasses per day   Exercise at least 150 minutes every week.

## 2019-09-04 NOTE — Assessment & Plan Note (Signed)
Will place referral to urology.  Continue Flomax 0.4 mg daily.

## 2019-09-07 ENCOUNTER — Other Ambulatory Visit: Payer: Self-pay | Admitting: *Deleted

## 2019-09-07 MED ORDER — METFORMIN HCL 1000 MG PO TABS: 1000.0000 mg | ORAL_TABLET | Freq: Every day | ORAL | 0 refills | Status: DC

## 2019-09-10 ENCOUNTER — Telehealth: Payer: Self-pay | Admitting: Family Medicine

## 2019-09-10 NOTE — Telephone Encounter (Signed)
Results was mail, pt stated have not received them, will pick up a copy today

## 2019-09-10 NOTE — Telephone Encounter (Signed)
Patient is wondering if he is able to pick up a copy his test results, states he was told they were going to be mailed Monday but has not received anything.

## 2019-09-23 ENCOUNTER — Other Ambulatory Visit: Payer: Self-pay | Admitting: Family Medicine

## 2019-09-23 DIAGNOSIS — L309 Dermatitis, unspecified: Secondary | ICD-10-CM

## 2019-10-07 ENCOUNTER — Other Ambulatory Visit: Payer: Self-pay | Admitting: Family Medicine

## 2019-10-13 ENCOUNTER — Telehealth: Payer: Self-pay

## 2019-10-13 NOTE — Telephone Encounter (Signed)
Spoke to pt, c/o sugars being elevated since Metformin was changed at last visit 04/09 to only 1000 mg daily. Pt was taking Metformin 1000 mg BID. Pt said fasting sugars have been running 115-123. Pt is concerned. Denies hypoglycemia symptoms, dizziness or blurred vision. Pt said he is tired not sleeping well. Told pt will send message to Dr. Jerline Pain and get back to him. Pt verbalized understanding.

## 2019-10-13 NOTE — Telephone Encounter (Signed)
Please see message and advise 

## 2019-10-13 NOTE — Telephone Encounter (Signed)
Patient requesting a call back from the nurse regarding medication. Patient did not want to provide additional information.

## 2019-10-13 NOTE — Telephone Encounter (Signed)
Patient requesting a call back from the nurse regarding medication. Please follow up with patient.

## 2019-10-14 NOTE — Telephone Encounter (Signed)
Those fasting sugars are acceptable but I am ok with him going back to 1000mg  bid to see if it improves his symptoms.  Algis Greenhouse. Jerline Pain, MD 10/14/2019 7:58 AM

## 2019-10-14 NOTE — Telephone Encounter (Signed)
Spoke to pt told him per Dr. Jerline Pain, Those fasting sugars are acceptable but I am ok with him going back to 1000mg  bid to see if it improves his symptoms. Pt verbalized understanding and said that sounds like a good idea.

## 2019-10-17 ENCOUNTER — Other Ambulatory Visit: Payer: Self-pay | Admitting: Internal Medicine

## 2019-10-19 NOTE — Telephone Encounter (Signed)
Rx request sent to pharmacy.  

## 2019-10-22 DIAGNOSIS — R3915 Urgency of urination: Secondary | ICD-10-CM | POA: Diagnosis not present

## 2019-10-22 DIAGNOSIS — N528 Other male erectile dysfunction: Secondary | ICD-10-CM | POA: Diagnosis not present

## 2019-10-22 DIAGNOSIS — R351 Nocturia: Secondary | ICD-10-CM | POA: Diagnosis not present

## 2019-11-23 ENCOUNTER — Other Ambulatory Visit: Payer: Self-pay | Admitting: Family Medicine

## 2019-11-24 ENCOUNTER — Other Ambulatory Visit: Payer: Self-pay | Admitting: *Deleted

## 2019-11-24 MED ORDER — ACCU-CHEK FASTCLIX LANCETS MISC: 4 refills | Status: DC

## 2019-12-08 ENCOUNTER — Other Ambulatory Visit: Payer: Self-pay

## 2019-12-08 ENCOUNTER — Ambulatory Visit (INDEPENDENT_AMBULATORY_CARE_PROVIDER_SITE_OTHER): Payer: Medicare Other | Admitting: Family Medicine

## 2019-12-08 ENCOUNTER — Encounter: Payer: Self-pay | Admitting: Family Medicine

## 2019-12-08 VITALS — BP 127/75 | HR 65 | Temp 98.3°F | Ht 72.0 in | Wt 220.0 lb

## 2019-12-08 DIAGNOSIS — R1310 Dysphagia, unspecified: Secondary | ICD-10-CM | POA: Diagnosis not present

## 2019-12-08 DIAGNOSIS — K2100 Gastro-esophageal reflux disease with esophagitis, without bleeding: Secondary | ICD-10-CM

## 2019-12-08 DIAGNOSIS — Z89511 Acquired absence of right leg below knee: Secondary | ICD-10-CM

## 2019-12-08 DIAGNOSIS — F411 Generalized anxiety disorder: Secondary | ICD-10-CM | POA: Diagnosis not present

## 2019-12-08 DIAGNOSIS — F419 Anxiety disorder, unspecified: Secondary | ICD-10-CM | POA: Diagnosis not present

## 2019-12-08 DIAGNOSIS — K219 Gastro-esophageal reflux disease without esophagitis: Secondary | ICD-10-CM | POA: Insufficient documentation

## 2019-12-08 MED ORDER — PANTOPRAZOLE SODIUM 40 MG PO TBEC: 40.0000 mg | DELAYED_RELEASE_TABLET | Freq: Every day | ORAL | 1 refills | Status: DC

## 2019-12-08 MED ORDER — TRAMADOL HCL 50 MG PO TABS: 50.0000 mg | ORAL_TABLET | Freq: Four times a day (QID) | ORAL | 0 refills | Status: DC | PRN

## 2019-12-08 MED ORDER — LORAZEPAM 0.5 MG PO TABS: 0.5000 mg | ORAL_TABLET | Freq: Three times a day (TID) | ORAL | 0 refills | Status: DC | PRN

## 2019-12-08 NOTE — Assessment & Plan Note (Signed)
Database no red flags.  Will refill Ativan today.

## 2019-12-08 NOTE — Assessment & Plan Note (Signed)
Stable.  Will refill tramadol today.  Continue gabapentin 100 mg twice daily.

## 2019-12-08 NOTE — Assessment & Plan Note (Signed)
Patient now with 2 episodes of dysphagia.  Will start Protonix 40 mg daily and place referral for him to see GI.

## 2019-12-08 NOTE — Progress Notes (Signed)
   [name] is a 79 y.o. male who presents today for an office visit.  Assessment/Plan:  Chronic Problems Addressed Today: Anxiety state Database no red flags.  Will refill Ativan today.  GERD (gastroesophageal reflux disease) Patient now with 2 episodes of dysphagia.  Will start Protonix 40 mg daily and place referral for him to see GI.  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  Will refill tramadol today.  Continue gabapentin 100 mg twice daily.     Subjective:  HPI:  Symptoms started a week or two ago. Felt like something got stuck in his throat. Was eating chicken and potatoes. Ended up regurgitating what he ate. Tries taking alka seltzer which helped some.  He has had 2 episodes of regurgitation.  He has noticed that some of his medications cause a burning sensation in those abdomen as well.  No unintentional weight loss.  No reported night sweats.  See A/P for status of other chronic conditions.       Objective:  Physical Exam: BP 127/75   Pulse 65   Temp 98.3 F (36.8 C)   Ht 6' (1.829 m)   Wt 220 lb (99.8 kg)   SpO2 97%   BMI 29.84 kg/m   Gen: No acute distress, resting comfortably CV: Regular rate and rhythm with no murmurs appreciated Pulm: Normal work of breathing, clear to auscultation bilaterally with no crackles, wheezes, or rhonchi Neuro: Grossly normal, moves all extremities Psych: Normal affect and thought content      [name] M. Jerline Pain, MD 12/08/2019 3:11 PM

## 2019-12-08 NOTE — Patient Instructions (Signed)
It was very nice to see you today!  Please start the acid blocker medication. I will place a referral for you to see the GI doctor.   I will refill your medications today.   Take care, Dr Jerline Pain  Please try these tips to maintain a healthy lifestyle:   Eat at least 3 REAL meals and 1-2 snacks per day.  Aim for no more than 5 hours between eating.  If you eat breakfast, please do so within one hour of getting up.    Each meal should contain half fruits/vegetables, one quarter protein, and one quarter carbs (no bigger than a computer mouse)   Cut down on sweet beverages. This includes juice, soda, and sweet tea.     Drink at least 1 glass of water with each meal and aim for at least 8 glasses per day   Exercise at least 150 minutes every week.

## 2019-12-09 ENCOUNTER — Encounter: Payer: Self-pay | Admitting: Gastroenterology

## 2019-12-18 DIAGNOSIS — Z85828 Personal history of other malignant neoplasm of skin: Secondary | ICD-10-CM | POA: Diagnosis not present

## 2019-12-18 DIAGNOSIS — C44319 Basal cell carcinoma of skin of other parts of face: Secondary | ICD-10-CM | POA: Diagnosis not present

## 2019-12-20 ENCOUNTER — Other Ambulatory Visit (INDEPENDENT_AMBULATORY_CARE_PROVIDER_SITE_OTHER): Payer: Self-pay | Admitting: Family Medicine

## 2020-01-02 ENCOUNTER — Other Ambulatory Visit: Payer: Self-pay | Admitting: Family Medicine

## 2020-01-05 ENCOUNTER — Ambulatory Visit (INDEPENDENT_AMBULATORY_CARE_PROVIDER_SITE_OTHER): Payer: Medicare Other | Admitting: Gastroenterology

## 2020-01-05 ENCOUNTER — Telehealth: Payer: Self-pay | Admitting: Gastroenterology

## 2020-01-05 ENCOUNTER — Encounter: Payer: Self-pay | Admitting: Gastroenterology

## 2020-01-05 VITALS — BP 118/72 | HR 64 | Ht 72.0 in | Wt 223.0 lb

## 2020-01-05 DIAGNOSIS — R1310 Dysphagia, unspecified: Secondary | ICD-10-CM

## 2020-01-05 DIAGNOSIS — K219 Gastro-esophageal reflux disease without esophagitis: Secondary | ICD-10-CM | POA: Diagnosis not present

## 2020-01-05 DIAGNOSIS — R1319 Other dysphagia: Secondary | ICD-10-CM

## 2020-01-05 NOTE — Telephone Encounter (Signed)
Patient called states he has questions in reference to his medications

## 2020-01-05 NOTE — Progress Notes (Signed)
01/05/2020 [name] [id number] 11/05/1940   HISTORY OF PRESENT ILLNESS:  This is a pleasant 79 year old male who is a patient of Dr. Ardis Hughs, known to him only for colonoscopy in 2016.  He is here today with complaints of increase in heartburn/reflux symptoms over the past 2-3 months as well as new symptoms of dysphagia.  He tells me that in the past he used to get occasional heartburn, but would take an Alka-Seltzer and it would go away.  Over the past 2 to 3 months it has become a much more prevalent issue.  He has also noticed intermittent issues with swallowing both solid food and even liquids on occasion.  He says that overall his appetite is good his weight is stable.  He has pantoprazole 40 mg daily on his medication list, prescription was sent in or least renewed by his PCP just in July of this year, but he is unsure if he is taking it or not.  He has never had EGD in the past.   Past Medical History:  Diagnosis Date  . CAD (coronary artery disease)    a. Remote cath 2008 for chest pain showed 30% LAD with luminal irregularity and fairly heavy calcification in the mid LAD without critical stenosis, 30% OM1, 30% acute marginal.  . Cancer (HCC)    skin cancer - basal  . Cataract    both  . Chronic combined systolic and diastolic CHF (congestive heart failure) ([place])   . Complication of anesthesia   . Diabetes mellitus    Type II  . ED (erectile dysfunction)   . History of kidney stones    x1  . Hypertension   . NICM (nonischemic cardiomyopathy) ([place])    a. EF 40-45% by echo in 08/2017 - prior low risk stress test in 2017.  . Osteoarthritis   . Paroxysmal SVT (supraventricular tachycardia) ([place])   . PONV (postoperative nausea and vomiting) 1960   with Ether  . PVC's (premature ventricular contractions)   . PVD (peripheral vascular disease) ([place])    s/p R BKA  . Rheumatic fever   . Rhinitis   . S/P BKA (below knee amputation), right [facility])    Past Surgical History:    Procedure Laterality Date  . AMPUTATION Right 02/29/2016   Procedure: Right Leg AMPUTATION BELOW KNEE with Wound Vac placement;  Surgeon: [name], MD;  Location: [place];  Service: Orthopedics;  Laterality: Right;  . CHOLECYSTECTOMY  1983  . colonoscopy  2006, 2010  . EYE SURGERY Bilateral    cataract  . INGUINAL HERNIA REPAIR Right 1960  . LEFT HEART CATH AND CORONARY ANGIOGRAPHY N/A 06/03/2018   Procedure: LEFT HEART CATH AND CORONARY ANGIOGRAPHY;  Surgeon: Belva Crome, MD;  Location: [facility];  Service: Cardiovascular;  Laterality: N/A;  . STUMP REVISION Right 10/30/2017   Procedure: RIGHT BELOW KNEE AMPUTATION REVISION;  Surgeon: [name], MD;  Location: [facility];  Service: Orthopedics;  Laterality: Right;    reports that he quit smoking about 38 years ago. He quit after 20.00 years of use. He has never used smokeless tobacco. He reports current alcohol use. He reports that he does not use drugs. family history includes Coronary artery disease (age of onset: 57) in his brother; Stroke (age of onset: 40) in his father. Allergies  Allergen Reactions  . Entresto [Sacubitril-Valsartan]     Intolerant due to frequent urination  . Lisinopril Cough  Outpatient Encounter Medications as of 01/05/2020  Medication Sig  . Accu-Chek FastClix Lancets MISC USE TO CHECK BLOOD SUGAR DAILY AND PRN  . aspirin 325 MG tablet Take 325 mg by mouth daily.  Marland Kitchen aspirin-sod bicarb-citric acid (ALKA-SELTZER) 325 MG TBEF tablet Take 650 mg by mouth daily as needed (heart burn).  . carvedilol (COREG) 25 MG tablet TAKE 1 TABLET BY MOUTH TWICE A DAY  . ENTRESTO 49-51 MG TAKE 1 TABLET BY MOUTH TWICE A DAY  . gabapentin (NEURONTIN) 100 MG capsule TAKE 1 CAPSULE BY MOUTH THREE TIMES A DAY  . glucose blood (ACCU-CHEK AVIVA PLUS) test strip USE TO TEST Glucose ONCE DAILY E11.9  . halobetasol (ULTRAVATE) 0.05 % cream APPLY TO AFFECTED AREA TWICE A DAY  . hydrocortisone cream 1 % Apply 1 application  topically daily as needed for itching.  . loratadine (CLARITIN) 10 MG tablet Take 10 mg by mouth daily as needed for allergies.   Marland Kitchen LORazepam (ATIVAN) 0.5 MG tablet Take 1 tablet (0.5 mg total) by mouth every 8 (eight) hours as needed for anxiety.  . metFORMIN (GLUCOPHAGE) 1000 MG tablet Take 1 tablet (1,000 mg total) by mouth daily with breakfast.  . Multiple Vitamin (MULTIVITAMIN) tablet Take 1 tablet by mouth daily.  Marland Kitchen nystatin cream (MYCOSTATIN) APPLY TO AFFECTED AREA TWICE A DAY  . pantoprazole (PROTONIX) 40 MG tablet Take 1 tablet (40 mg total) by mouth daily.  . potassium chloride SA (KLOR-CON M20) 20 MEQ tablet TAKE 2 TABLETS BY MOUTH IN THE MORNING  . tadalafil (CIALIS) 10 MG tablet Take 0.5-1 tablets (5-10 mg total) by mouth daily as needed for erectile dysfunction.  . tamsulosin (FLOMAX) 0.4 MG CAPS capsule Take 1 capsule (0.4 mg total) by mouth daily.  . Tetrahydrozoline HCl (VISINE OP) Place 1 drop into both eyes daily as needed (dry eyes).  . traMADol (ULTRAM) 50 MG tablet Take 1 tablet (50 mg total) by mouth every 6 (six) hours as needed for moderate pain.   No facility-administered encounter medications on file as of 01/05/2020.    REVIEW OF SYSTEMS  : All other systems reviewed and negative except where noted in the History of Present Illness.   PHYSICAL EXAM: BP 118/72   Pulse 64   Ht 6' (1.829 m)   Wt 223 lb (101.2 kg)   BMI 30.24 kg/m  General: Well developed white male in no acute distress Head: Normocephalic and atraumatic Eyes:  Sclerae anicteric, conjunctiva pink. Ears: Normal auditory acuity Lungs: Clear throughout to auscultation; no W/R/R. Heart: Regular rate and rhythm; no M/R/G. Abdomen: Soft, non-distended.  BS present.  Non-tender. Musculoskeletal: Symmetrical with no gross deformities  Skin: No lesions on visible extremities Extremities: No edema.  Right LE prosthesis. Neurological: Alert oriented x 4, grossly non-focal Psychological:  Alert and  cooperative. Normal mood and affect  ASSESSMENT AND PLAN: *Dysphagia and worsening GERD/ heartburn symptoms:  Intermittent episodes of dysphagia to both food and liquids and increase in heartburn symptoms over the past 2-3 months.  Will plan for EGD with possible dilation with Dr. Ardis Hughs.  Rule out stricture vs esophagitis vs malignancy.  The risks, benefits, and alternatives to EGD with dilation were discussed with the patient and he consents to proceed.  He will check his medications at home.  If he is not taking his pantoprazole then he will begin doing so. *EF 40-45%   CC:  Vivi Barrack, MD

## 2020-01-05 NOTE — Patient Instructions (Signed)
If you are age 80 or older, your body mass index should be between 23-30. Your Body mass index is 30.24 kg/m. If this is out of the aforementioned range listed, please consider follow up with your Primary Care Provider.  If you are age 76 or younger, your body mass index should be between 19-25. Your Body mass index is 30.24 kg/m. If this is out of the aformentioned range listed, please consider follow up with your Primary Care Provider.   You have been scheduled for an endoscopy. Please follow written instructions given to you at your visit today. If you use inhalers (even only as needed), please bring them with you on the day of your procedure.  Due to recent changes in healthcare laws, you may see the results of your imaging and laboratory studies on MyChart before your provider has had a chance to review them.  We understand that in some cases there may be results that are confusing or concerning to you. Not all laboratory results come back in the same time frame and the provider may be waiting for multiple results in order to interpret others.  Please give [place] 48 hours in order for your provider to thoroughly review all the results before contacting the office for clarification of your results.

## 2020-01-05 NOTE — Telephone Encounter (Signed)
Patient called back and he did find the Pantoprazole. Patient states he wants to try the Pantoprazole for a month before proceeding with the EGD. Informed patient to call in a month to let us know and will cancel his EGD.

## 2020-01-06 NOTE — Telephone Encounter (Signed)
Ok. Thank you.

## 2020-01-12 NOTE — Progress Notes (Signed)
I agree with the above note, plan 

## 2020-01-13 ENCOUNTER — Encounter: Payer: Self-pay | Admitting: Gastroenterology

## 2020-01-25 DIAGNOSIS — N528 Other male erectile dysfunction: Secondary | ICD-10-CM | POA: Diagnosis not present

## 2020-01-25 DIAGNOSIS — R351 Nocturia: Secondary | ICD-10-CM | POA: Diagnosis not present

## 2020-01-25 DIAGNOSIS — R3915 Urgency of urination: Secondary | ICD-10-CM | POA: Diagnosis not present

## 2020-02-15 DIAGNOSIS — Z85828 Personal history of other malignant neoplasm of skin: Secondary | ICD-10-CM | POA: Diagnosis not present

## 2020-02-15 DIAGNOSIS — C44319 Basal cell carcinoma of skin of other parts of face: Secondary | ICD-10-CM | POA: Diagnosis not present

## 2020-02-24 ENCOUNTER — Other Ambulatory Visit: Payer: Self-pay | Admitting: Family Medicine

## 2020-03-04 ENCOUNTER — Other Ambulatory Visit: Payer: Self-pay | Admitting: Family Medicine

## 2020-03-08 ENCOUNTER — Ambulatory Visit: Payer: Medicare Other | Admitting: Family Medicine

## 2020-03-11 ENCOUNTER — Encounter: Payer: Self-pay | Admitting: Family Medicine

## 2020-03-11 ENCOUNTER — Ambulatory Visit (INDEPENDENT_AMBULATORY_CARE_PROVIDER_SITE_OTHER): Payer: Medicare Other | Admitting: Family Medicine

## 2020-03-11 ENCOUNTER — Other Ambulatory Visit: Payer: Self-pay

## 2020-03-11 VITALS — BP 128/73 | HR 54 | Temp 97.7°F | Ht 72.0 in | Wt 226.0 lb

## 2020-03-11 DIAGNOSIS — R739 Hyperglycemia, unspecified: Secondary | ICD-10-CM | POA: Diagnosis not present

## 2020-03-11 DIAGNOSIS — I10 Essential (primary) hypertension: Secondary | ICD-10-CM | POA: Diagnosis not present

## 2020-03-11 DIAGNOSIS — N643 Galactorrhea not associated with childbirth: Secondary | ICD-10-CM

## 2020-03-11 DIAGNOSIS — Z23 Encounter for immunization: Secondary | ICD-10-CM

## 2020-03-11 LAB — POCT GLYCOSYLATED HEMOGLOBIN (HGB A1C): Hemoglobin A1C: 5.4 % (ref 4.0–5.6)

## 2020-03-11 NOTE — Patient Instructions (Signed)
It was very nice to see you today!  Your A1c looks great.  We will continue your Metformin.  Please let me know if you would like to have referral to see a physical therapist.  No other changes today.  Please come back in 6 months for your annual checkup with labs.  Please come back to see me sooner if needed.  Take care, Dr Jerline Pain  Please try these tips to maintain a healthy lifestyle:   Eat at least 3 REAL meals and 1-2 snacks per day.  Aim for no more than 5 hours between eating.  If you eat breakfast, please do so within one hour of getting up.    Each meal should contain half fruits/vegetables, one quarter protein, and one quarter carbs (no bigger than a computer mouse)   Cut down on sweet beverages. This includes juice, soda, and sweet tea.     Drink at least 1 glass of water with each meal and aim for at least 8 glasses per day   Exercise at least 150 minutes every week.

## 2020-03-11 NOTE — Progress Notes (Signed)
   [name] is a 79 y.o. male who presents today for an office visit.  Assessment/Plan:  Fall Offered referral to physical therapy due to his prosthesis and recent fall a few months ago.  Patient declined.  No obvious injuries on exam today.  Follow-up mechanical in nature.  Chronic Problems Addressed Today: Essential hypertension At goal.  Continue Coreg 25 mg daily and Entresto 49-51 twice daily.  Hyperglycemia A1c 5.4.  He would like to continue Metformin 1000 mg daily.  We will recheck in 6 months.   Flu vaccine given today.     Subjective:  HPI:  See A/p.     Patient suffered a mechanical fall several weeks ago.  States that his leg slipped out from under him on a hill.  He attributes this to his prosthesis.  This is on follow-up he has had past few years.  He scraped up his elbow but did not have any other injuries.       Objective:  Physical Exam: BP 128/73   Pulse (!) 54   Temp 97.7 F (36.5 C) (Temporal)   Ht 6' (1.829 m)   Wt 226 lb (102.5 kg)   SpO2 96%   BMI 30.65 kg/m   Gen: No acute distress, resting comfortably Neuro: Grossly normal, moves all extremities Psych: Normal affect and thought content      Gurpreet Mariani M. Jerline Pain, MD 03/11/2020 9:17 AM

## 2020-03-11 NOTE — Assessment & Plan Note (Signed)
A1c 5.4.  He would like to continue Metformin 1000 mg daily.  We will recheck in 6 months.

## 2020-03-11 NOTE — Assessment & Plan Note (Signed)
At goal.  Continue Coreg 25 mg daily and Entresto 49-51 twice daily.

## 2020-03-19 ENCOUNTER — Other Ambulatory Visit: Payer: Self-pay | Admitting: Family Medicine

## 2020-03-28 ENCOUNTER — Other Ambulatory Visit: Payer: Self-pay | Admitting: Internal Medicine

## 2020-03-28 DIAGNOSIS — N401 Enlarged prostate with lower urinary tract symptoms: Secondary | ICD-10-CM

## 2020-04-02 DIAGNOSIS — Z23 Encounter for immunization: Secondary | ICD-10-CM | POA: Diagnosis not present

## 2020-04-07 ENCOUNTER — Telehealth: Payer: Self-pay | Admitting: Family Medicine

## 2020-04-07 NOTE — Chronic Care Management (AMB) (Signed)
  Chronic Care Management   Note  04/07/2020 Name: [name] MRN: [id number] DOB: 1940/09/17  [name] is a 79 y.o. year old male who is a primary care patient of Vivi Barrack, MD. I reached out to [name] by phone today in response to a referral sent by [name] PCP, Vivi Barrack, MD.   [name] was given information about Chronic Care Management services today including:  1. CCM service includes personalized support from designated clinical staff supervised by his physician, including individualized plan of care and coordination with other care providers 2. 24/7 contact phone numbers for assistance for urgent and routine care needs. 3. Service will only be billed when office clinical staff spend 20 minutes or more in a month to coordinate care. 4. Only one practitioner may furnish and bill the service in a calendar month. 5. The patient may stop CCM services at any time (effective at the end of the month) by phone call to the office staff.   Patient agreed to services and verbal consent obtained.   Follow up plan:   Lauretta Grill [employer] Scheduler

## 2020-04-13 ENCOUNTER — Other Ambulatory Visit: Payer: Self-pay | Admitting: Family Medicine

## 2020-04-20 ENCOUNTER — Other Ambulatory Visit: Payer: Self-pay | Admitting: Family Medicine

## 2020-04-20 DIAGNOSIS — L309 Dermatitis, unspecified: Secondary | ICD-10-CM

## 2020-04-28 ENCOUNTER — Other Ambulatory Visit: Payer: Self-pay | Admitting: Internal Medicine

## 2020-05-02 ENCOUNTER — Other Ambulatory Visit: Payer: Self-pay | Admitting: Family Medicine

## 2020-05-08 ENCOUNTER — Other Ambulatory Visit: Payer: Self-pay | Admitting: Family Medicine

## 2020-05-24 ENCOUNTER — Telehealth: Payer: Self-pay

## 2020-05-24 NOTE — Progress Notes (Unsigned)
Chronic Care Management Pharmacy Assistant   Name: [name]  MRN: [id number] DOB: [birth year]  Reason for Encounter: Medication Review/ Initial Visit     [name],  79 y.o. , male presents for their Initial CCM visit with the clinical pharmacist In office.  PCP : Ardith Dark, MD  Allergies:   Allergies  Allergen Reactions  . Entresto [Sacubitril-Valsartan]     Intolerant due to frequent urination  . Lisinopril Cough    Medications: Outpatient Encounter Medications as of 05/24/2020  Medication Sig  . Accu-Chek FastClix Lancets MISC USE TO CHECK BLOOD SUGAR DAILY AND PRN  . aspirin 325 MG tablet Take 325 mg by mouth daily.  Marland Kitchen aspirin-sod bicarb-citric acid (ALKA-SELTZER) 325 MG TBEF tablet Take 650 mg by mouth daily as needed (heart burn).  . carvedilol (COREG) 25 MG tablet TAKE 1 TABLET BY MOUTH TWICE A DAY  . ENTRESTO 49-51 MG TAKE 1 TABLET BY MOUTH TWICE A DAY  . gabapentin (NEURONTIN) 100 MG capsule TAKE 1 CAPSULE BY MOUTH THREE TIMES A DAY  . glucose blood (ACCU-CHEK AVIVA PLUS) test strip USE TO TEST Glucose ONCE DAILY E11.9  . halobetasol (ULTRAVATE) 0.05 % cream APPLY TO AFFECTED AREA TWICE A DAY  . hydrocortisone cream 1 % Apply 1 application topically daily as needed for itching.  . loratadine (CLARITIN) 10 MG tablet Take 10 mg by mouth daily as needed for allergies.   Marland Kitchen LORazepam (ATIVAN) 0.5 MG tablet Take 1 tablet (0.5 mg total) by mouth every 8 (eight) hours as needed for anxiety.  . metFORMIN (GLUCOPHAGE) 1000 MG tablet TAKE 1 TABLET BY MOUTH EVERY DAY WITH BREAKFAST  . Multiple Vitamin (MULTIVITAMIN) tablet Take 1 tablet by mouth daily.  Marland Kitchen nystatin cream (MYCOSTATIN) APPLY TO AFFECTED AREA TWICE A DAY  . pantoprazole (PROTONIX) 40 MG tablet TAKE 1 TABLET BY MOUTH EVERY DAY  . potassium chloride SA (KLOR-CON M20) 20 MEQ tablet TAKE 2 TABLETS BY MOUTH IN THE MORNING  . sildenafil (VIAGRA) 100 MG tablet TAKE 1 TABLET DAILY AS NEEDED ED  . tadalafil  (CIALIS) 10 MG tablet Take 0.5-1 tablets (5-10 mg total) by mouth daily as needed for erectile dysfunction.  . tamsulosin (FLOMAX) 0.4 MG CAPS capsule TAKE 1 CAPSULE BY MOUTH EVERY DAY  . Tetrahydrozoline HCl (VISINE OP) Place 1 drop into both eyes daily as needed (dry eyes).  . traMADol (ULTRAM) 50 MG tablet Take 1 tablet (50 mg total) by mouth every 6 (six) hours as needed for moderate pain.   No facility-administered encounter medications on file as of 05/24/2020.    Current Diagnosis: Patient Active Problem List   Diagnosis Date Noted  . Gastroesophageal reflux disease 12/08/2019  . Dermatitis 03/10/2018  . Benign prostatic hyperplasia with nocturia 11/12/2017  . Nonischemic cardiomyopathy (HCC) 09/03/2017  . AVNRT (AV nodal re-entry tachycardia) (HCC) 01/30/2017  . Chronic midline low back pain without sciatica 01/03/2017  . H/O amputation of leg through tibia and fibula, right (HCC) 05/03/2016  . Phantom limb pain (HCC) 03/06/2016  . SVT (supraventricular tachycardia) (HCC) 01/20/2016  . Chronic systolic heart failure (HCC) 01/20/2016  . Esophageal dysphagia 08/06/2014  . Anxiety state 10/26/2013  . Hyperglycemia 11/02/2010  . BASAL CELL CARCINOMA, FACE 10/18/2008  . LIPOMA, SKIN 05/31/2008  . ERECTILE DYSFUNCTION 09/30/2007  . OSTEOARTHRITIS 08/08/2007  . Essential hypertension 03/24/2007    Have you seen any other providers since your last visit? **{YES NO:22349}  Any changes in your medications or  health? {YES NO:22349}  Any side effects from any medications? {YES NO:22349}  Do you have an symptoms or problems not managed by your medications? {YES NO:22349}  Any concerns about your health right now? {YES NO:22349}  Has your provider asked that you check blood pressure, blood sugar, or follow special diet at home? {YES NO:22349}  Do you get any type of exercise on a regular basis? {YES NO:22349}  Can you think of a goal you would like to reach for your health?  ***  Do you have any problems getting your medications? {YES NO:22349}  Is there anything that you would like to discuss during the appointment? ***  Please bring medications and supplements to appointment  Aloha Gell ,[facility] Clinical Pharmacist Assistant [phone]  Follow-Up:  Pharmacist Review

## 2020-05-25 ENCOUNTER — Ambulatory Visit: Payer: Medicare Other

## 2020-05-25 ENCOUNTER — Other Ambulatory Visit: Payer: Self-pay

## 2020-05-25 DIAGNOSIS — R739 Hyperglycemia, unspecified: Secondary | ICD-10-CM

## 2020-05-25 DIAGNOSIS — K2100 Gastro-esophageal reflux disease with esophagitis, without bleeding: Secondary | ICD-10-CM

## 2020-05-25 DIAGNOSIS — I10 Essential (primary) hypertension: Secondary | ICD-10-CM

## 2020-05-25 NOTE — Progress Notes (Signed)
Chronic Care Management Pharmacy Name: Tyrone [name]     MRN: [id number]     DOB: Jan 08, 1941  Chief Complaint/ HPI Tyrone [name], 79 y.o., Tyrone [name], Tyrone [name] with the clinical pharmacist in office.  PCP: Vivi Barrack, MD Encounter Diagnoses  Name Primary?  . Essential hypertension Yes  . Hyperglycemia   . Gastroesophageal reflux disease with esophagitis, unspecified whether hemorrhage     Office Visits:  03/11/2020 (PCP): previously declined referral to PT. Prosthesis and recent fall a few months ago. Lab recheck 6 months. a1 5.4 with hyperglycemia dgx, pt opted to stay on tx.  Consult [name]: 01/05/2020 Janett Billow Zehr): pt of Dr Ardis Hughs, unsure if taking pantoprazole. 07/27/2019 (Dr. Debara Pickett): continues to report feeling fairly well.  He like to get back to playing some golf.  His echo shows stable LVEF of 40 to 45%.  He remains on Entresto but notes that because of excessive urination I could not increase his Entresto any further.  Blood pressures well controlled.  Denies any chest pain or worsening shortness of breath.  Patient Active Problem List   Diagnosis Date Noted  . Gastroesophageal reflux disease 12/08/2019  . Dermatitis 03/10/2018  . Benign prostatic hyperplasia with nocturia 11/12/2017  . Nonischemic cardiomyopathy ([place]) 09/03/2017  . AVNRT (AV nodal re-entry tachycardia) ([place]) 01/30/2017  . Chronic midline low back pain without sciatica 01/03/2017  . H/O amputation of leg through tibia and fibula, right ([place]) 05/03/2016  . Phantom limb pain ([place]) 03/06/2016  . SVT (supraventricular tachycardia) ([place]) 01/20/2016  . Chronic systolic heart failure ([place]) 01/20/2016  . Esophageal dysphagia 08/06/2014  . Anxiety state 10/26/2013  . Hyperglycemia 11/02/2010  . BASAL CELL CARCINOMA, FACE 10/18/2008  . LIPOMA, SKIN 05/31/2008  . ERECTILE DYSFUNCTION 09/30/2007  . OSTEOARTHRITIS 08/08/2007  . Essential hypertension 03/24/2007   Past Surgical History:   Procedure Laterality Date  . AMPUTATION Right 02/29/2016   Procedure: Right Leg AMPUTATION BELOW KNEE with Wound Vac placement;  Surgeon: [name], MD;  Location: [facility];  Service: Orthopedics;  Laterality: Right;  . CHOLECYSTECTOMY  1983  . colonoscopy  2006, 2010  . EYE SURGERY Bilateral    cataract  . INGUINAL HERNIA REPAIR Right 1960  . LEFT HEART CATH AND CORONARY ANGIOGRAPHY N/A 06/03/2018   Procedure: LEFT HEART CATH AND CORONARY ANGIOGRAPHY;  Surgeon: Belva Crome, MD;  Location: [facility];  Service: Cardiovascular;  Laterality: N/A;  . STUMP REVISION Right 10/30/2017   Procedure: RIGHT BELOW KNEE AMPUTATION REVISION;  Surgeon: [name], MD;  Location: [facility];  Service: Orthopedics;  Laterality: Right;   Family History  Problem Relation Age of Onset  . Stroke Father 79       Died age 42  . Coronary artery disease Brother 50  . Colon cancer Neg Hx   . Colon polyps Neg Hx   . Diabetes Neg Hx   . Esophageal cancer Neg Hx   . Kidney disease Neg Hx   . Gallbladder disease Neg Hx    Allergies  Allergen Reactions  . Entresto [Sacubitril-Valsartan]     Intolerant due to frequent urination  . Lisinopril Cough   Outpatient Encounter Medications as of 05/25/2020  Medication Sig  . aspirin 325 MG tablet Take 325 mg by mouth daily.  . carvedilol (COREG) 25 MG tablet TAKE 1 TABLET BY MOUTH TWICE A DAY  . ENTRESTO 49-51 MG TAKE 1 TABLET BY MOUTH TWICE A DAY  .  gabapentin (NEURONTIN) 100 MG capsule TAKE 1 CAPSULE BY MOUTH THREE TIMES A DAY  . metFORMIN (GLUCOPHAGE) 1000 MG tablet TAKE 1 TABLET BY MOUTH EVERY DAY WITH BREAKFAST  . pantoprazole (PROTONIX) 40 MG tablet TAKE 1 TABLET BY MOUTH EVERY DAY  . potassium chloride SA (KLOR-CON M20) 20 MEQ tablet TAKE 2 TABLETS BY MOUTH IN THE MORNING  . sildenafil (VIAGRA) 100 MG tablet TAKE 1 TABLET DAILY AS NEEDED ED  . solifenacin (VESICARE) 10 MG tablet Take 10 mg by mouth daily.  . tamsulosin (FLOMAX) 0.4 MG CAPS capsule  TAKE 1 CAPSULE BY MOUTH EVERY DAY  . Accu-Chek FastClix Lancets MISC USE TO CHECK BLOOD SUGAR DAILY AND PRN  . aspirin-sod bicarb-citric acid (ALKA-SELTZER) 325 MG TBEF tablet Take 650 mg by mouth daily as needed (heart burn).  Marland Kitchen glucose blood (ACCU-CHEK AVIVA PLUS) test strip USE TO TEST Glucose ONCE DAILY E11.9  . halobetasol (ULTRAVATE) 0.05 % cream APPLY TO AFFECTED AREA TWICE A DAY  . hydrocortisone cream 1 % Apply 1 application topically daily as needed for itching.  . loratadine (CLARITIN) 10 MG tablet Take 10 mg by mouth daily as needed for allergies.   Marland Kitchen LORazepam (ATIVAN) 0.5 MG tablet Take 1 tablet (0.5 mg total) by mouth every 8 (eight) hours as needed for anxiety.  . Multiple Vitamin (MULTIVITAMIN) tablet Take 1 tablet by mouth daily.  Marland Kitchen nystatin cream (MYCOSTATIN) APPLY TO AFFECTED AREA TWICE A DAY  . tadalafil (CIALIS) 10 MG tablet Take 0.5-1 tablets (5-10 mg total) by mouth daily as needed for erectile dysfunction. (Patient not taking: Reported on 05/25/2020)  . Tetrahydrozoline HCl (VISINE OP) Place 1 drop into both eyes daily as needed (dry eyes).  . traMADol (ULTRAM) 50 MG tablet Take 1 tablet (50 mg total) by mouth every 6 (six) hours as needed for moderate pain.   No facility-administered encounter medications on file as of 05/25/2020.   Patient Care Team    Relationship Specialty Notifications Start End  Ardith Dark, MD PCP - General Family Medicine  11/14/18   Chrystie Nose, MD PCP - Cardiology Cardiology Admissions 09/02/17   Antony Contras, MD Consulting Physician Ophthalmology  08/17/19   Dahlia Byes, [facility] Pharmacist Pharmacist  04/07/20    Comment: [phone]   Current Diagnosis/Assessment: Goals Addressed            This [name]'s Progress   . PharmD Care Plan       CARE PLAN ENTRY (see longitudinal plan of care for additional care plan information)  Current Barriers:  . Chronic Disease Management support, education, and care coordination needs  related to Hypertension, GERD, and Hyperglycemia.   Hypertension BP Readings from Last 3 Encounters:  03/11/20 128/73  01/05/20 118/72  12/08/19 127/75   . Pharmacist Clinical Goal(s): o Over the next 365 days, patient will work with PharmD and providers to maintain BP goal <130/80 . Current regimen:  . Entresto 49-51 mg twice daily (blood pressure / heart failure)(Dr Hilty) . Coreg 25 mg twice daily (blood pressure / heart failure)(Dr. Hilty) . Interventions: o Discussed diet/exercise o Reviewed home monitoring recommendations . Patient self care activities - Over the next 365 days, patient will: o Check BP at least once every 1-2 weeks, document, and provide at future appointments o Ensure daily salt intake < 2300 mg/day Hyperglycemia Lab Results  Component Value Date/Time   HGBA1C 5.4 03/11/2020 08:52 AM   HGBA1C 5.8 09/04/2019 09:18 AM   HGBA1C 5.9 (H) 10/30/2017 10:48 AM   .  Pharmacist Clinical Goal(s): o Over the next 365 days, patient will work with PharmD and providers to maintain A1c goal  5.7% . Current regimen:  o Metformin 1000 mg once daily  . Interventions: o Discussed diet/exercise o Reviewed monitoring o Reviewed side effects - none noted. . Patient self care activities - Over the next 365 days, patient will: o Check blood sugar once daily, document, and provide at future appointments o Contact provider with any episodes of hypoglycemia  GERD . Pharmacist Clinical Goal(s) o Over the next 365 days, patient will work with PharmD and providers to minimize acid reflux symptoms . Current regimen:  o Pantoprazole 40 mg once daily . Interventions: o Discussed proper use of pantoprazole - take consistently first thing in morning o Reviewed acid reflux triggers at length - chocolate identified as most likely trigger . Patient self care activities - Over the next 365 days, patient will: o Attempt dietary modifications to see if pantoprazole may not be needed in  the future  Medication management . Pharmacist Clinical Goal(s): o Over the next 365 days, patient will work with PharmD and providers to maintain optimal medication adherence . [facility] [place] . Interventions o Comprehensive medication review performed. o Continue current medication management strategy . Patient self care activities - Over the next 365 days, patient will: o Take medications as prescribed o Report any questions or concerns to PharmD and/or provider(s) Initial goal documentation.      Hypertension   BP goal <130/80  BP Readings from Last 3 Encounters:  03/11/20 128/73  01/05/20 118/72  12/08/19 127/75    BMP Latest Ref Rng & Units 09/04/2019 07/10/2018 06/04/2018  Glucose 70 - 99 mg/dL 100(H) 164(H) 115(H)  BUN 6 - 23 mg/dL 17 15 14   Creatinine 0.40 - 1.50 mg/dL 0.68 0.79 0.76  BUN/Creat Ratio 10 - 24 - 19 -  Sodium 135 - 145 mEq/L 140 140 138  Potassium 3.5 - 5.1 mEq/L 4.6 4.5 3.8  Chloride 96 - 112 mEq/L 103 101 105  CO2 19 - 32 mEq/L 28 22 25   Calcium 8.4 - 10.5 mg/dL 9.3 9.1 [id number])   systolic heart failure - sees Dr. Debara Pickett.  Previous medications: lisinopril, losartan, atenolol, chlorthalidone.  Eats breakfast and dinner primarily, lots of chocolate for snacks.  Is now starting to get back to golf 2x/week. Patient checks BP at home several times per month.  Recent home readings: 120s/60s.   Denies dizziness. Patient is currently at goal on the following medications:  . Entresto 49-51 mg twice daily (Dr Debara Pickett) . Coreg 25 mg twice daily (Dr. Debara Pickett)  We discussed diet/exercise - Maintain a healthy weight and exercise regularly, as directed by your health care provider. Eat healthy foods, such as: Lean proteins, complex carbohydrates, fresh fruits and vegetables, low-fat dairy products, healthy fats.  Reviewed patient assistance for Entresto - would not be eligible.  Plan  Continue current medications.  Hyperglycemia   A1c goal <  5.7%  Lab Results  Component Value Date/Time   HGBA1C 5.4 03/11/2020 08:52 AM   HGBA1C 5.8 09/04/2019 09:18 AM   HGBA1C 5.9 (H) 10/30/2017 10:48 AM   HGBA1C 5.6 05/03/2017 11:02 AM   HGBA1C 5.9 09/10/2016 04:14 PM   HGBA1C 5.8 02/06/2016 03:18 PM   HGBA1C 5.6 12/10/2015 10:14 AM   HGBA1C 5.6 10/05/2015 10:43 AM   HGBA1C 5.8 07/12/2015 04:35 PM   MICROALBUR 0.7 09/11/2016 12:06 PM   MICROALBUR <0.7 07/12/2015 04:35 PM   MICRALBCREAT  0.7 09/11/2016 12:06 PM   MICRALBCREAT 0.8 07/12/2015 04:35 PM   GFR 112.24 09/04/2019 09:18 AM   GFR 104.15 09/10/2016 04:14 PM   GFR 106.06 07/12/2015 04:35 PM   GFR 114.85 02/22/2015 09:00 AM   GFR 114.98 09/14/2014 08:45 AM    Previous medications: glipizide. Recent FBG readings: ~90-110, testing daily at home.  Denies GI related effects, tolerating without issue. Patient is currently at goal on: Marland Kitchen Metformin 1000 mg once daily  We reviewed potential side effects - none noted.  Plan  Continue current medications.  GERD   No results found for: VITAMINB12  Previous medications: alka-selter. Patient denies recent acid reflux.  Symptoms have improved now that this is taken consistently every morning. Currently controlled on: . Pantoprazole 40 mg once daily  We discussed: Avoidance of potential triggers such as alcohol, fatty foods, lying down after eating, and tomato sauce. Handout provided.  Plan   Continue current medications. Consider b12 lab at f/u.  OAB   Patient has failed these meds in past: n/a. Complaint of dry mouth past several months.   Potential dose-related anticholinergic effects of Vesicare leading to xerostomia.  Patient is currently controlled on the following medications:  . Vesicare 10 mg once daily  We discussed medication related side effects - consider discussing dose reduction to 5 mg or alternative at follow-up with Dr. Lovena Neighbours.  Plan  Continue current medications.  Vaccines   Immunization History   Administered Date(s) Administered  . Fluad Quad(high Dose 65+) 01/27/2019, 03/11/2020  . Influenza Split 04/10/2011  . Influenza Whole 02/25/2009  . Influenza, High Dose Seasonal PF 03/20/2017  . Influenza-Unspecified 02/25/2013, 02/26/2015  . Pneumococcal Polysaccharide-23 05/28/2005  . Td 05/28/2006, 09/10/2016  . Varicella 04/08/2012   Reviewed and discussed patient's vaccination history.   Received 3 moderna vaccines including booster - plans to bring card to office to provide specific dates.  Plan  Recommended patient receive shingrix vaccine in pharmacy.   Medication Management / Care Coordination   [facility] medications [facility]  [facility] [place] [place] [facility] [address] [place] [address] Phone: [phone] Fax: [place], [place] [address] [place] [address] Phone: [phone] Fax: [phone]   Denies any issues with current medication management.   Plan  Continue current medication management strategy. ___________________________ SDOH (Social Determinants of Health) assessments performed: Yes. Future Appointments  Date Time Provider [place]  02/21/2021  1:30 PM [facility] CCM PHARMACIST [facility]EC     [name] follow-up:  . CPA: 6 month BP call. Marland Kitchen RPH follow-up: 9 month telephone [name]. GERD, HTN, PAP as appropriate.  Madelin Rear, Pharm.D., BCGP Clinical Pharmacist [facility] [phone]

## 2020-05-26 NOTE — Patient Instructions (Signed)
[name],  Thank you for taking the time to review your medications with me today.  I have included our care plan/goals in the following pages. Please review and call me at [phone] with any questions!  Thanks! [name], Pharm.D., BCGP Clinical Pharmacist [facility] [phone]  Goals Addressed            This Visit's Progress   . PharmD Care Plan       CARE PLAN ENTRY (see longitudinal plan of care for additional care plan information)  Current Barriers:  . Chronic Disease Management support, education, and care coordination needs related to Hypertension, GERD, and Hyperglycemia.   Hypertension BP Readings from Last 3 Encounters:  03/11/20 128/73  01/05/20 118/72  12/08/19 127/75   . Pharmacist Clinical Goal(s): o Over the next 365 days, patient will work with PharmD and providers to maintain BP goal <130/80 . Current regimen:  . Entresto 49-51 mg twice daily (blood pressure / heart failure)(Dr Hilty) . Coreg 25 mg twice daily (blood pressure / heart failure)(Dr. Hilty) . Interventions: o Discussed diet/exercise o Reviewed home monitoring recommendations . Patient self care activities - Over the next 365 days, patient will: o Check BP at least once every 1-2 weeks, document, and provide at future appointments o Ensure daily salt intake < 2300 mg/day Hyperglycemia Lab Results  Component Value Date/Time   HGBA1C 5.4 03/11/2020 08:52 AM   HGBA1C 5.8 09/04/2019 09:18 AM   HGBA1C 5.9 (H) 10/30/2017 10:48 AM   . Pharmacist Clinical Goal(s): o Over the next 365 days, patient will work with PharmD and providers to maintain A1c goal  5.7% . Current regimen:  o Metformin 1000 mg once daily  . Interventions: o Discussed diet/exercise o Reviewed monitoring o Reviewed side effects - none noted. . Patient self care activities - Over the next 365 days, patient will: o Check blood sugar once daily, document, and provide at  future appointments o Contact provider with any episodes of hypoglycemia  GERD . Pharmacist Clinical Goal(s) o Over the next 365 days, patient will work with PharmD and providers to minimize acid reflux symptoms . Current regimen:  o Pantoprazole 40 mg once daily . Interventions: o Discussed proper use of pantoprazole - take consistently first thing in morning o Reviewed acid reflux triggers at length - chocolate identified as most likely trigger . Patient self care activities - Over the next 365 days, patient will: o Attempt dietary modifications to see if pantoprazole may not be needed in the future  Medication management . Pharmacist Clinical Goal(s): o Over the next 365 days, patient will work with PharmD and providers to maintain optimal medication adherence . [facility] [place] . Interventions o Comprehensive medication review performed. o Continue current medication management strategy . Patient self care activities - Over the next 365 days, patient will: o Take medications as prescribed o Report any questions or concerns to PharmD and/or provider(s) Initial goal documentation.      The patient verbalized understanding of instructions provided today and agreed to receive a mailed copy of patient instruction and/or educational materials. Telephone follow up appointment with pharmacy team member scheduled for: See next appointment with "Care Management Staff" under "What's Next" below.   Food Choices for Gastroesophageal Reflux Disease, Adult When you have gastroesophageal reflux disease (GERD), the foods you eat and your eating habits are very important. Choosing the right foods can help ease your discomfort.  Think about working with a nutrition specialist (dietitian) to help you make good choices. What are tips for following this plan?  Meals  Choose healthy foods that are low in fat, such as fruits, vegetables, whole grains, low-fat dairy products, and lean  meat, fish, and poultry.  Eat small meals often instead of 3 large meals a day. Eat your meals slowly, and in a place where you are relaxed. Avoid bending over or lying down until 2-3 hours after eating.  Avoid eating meals 2-3 hours before bed.  Avoid drinking a lot of liquid with meals.  Cook foods using methods other than frying. Bake, grill, or broil food instead.  Avoid or limit: ? Chocolate. ? Peppermint or spearmint. ? Alcohol. ? Pepper. ? Black and decaffeinated coffee. ? Black and decaffeinated tea. ? Bubbly (carbonated) soft drinks. ? Caffeinated energy drinks and soft drinks.  Limit high-fat foods such as: ? Fatty meat or fried foods. ? Whole milk, cream, butter, or ice cream. ? Nuts and nut butters. ? Pastries, donuts, and sweets made with butter or shortening.  Avoid foods that cause symptoms. These foods may be different for everyone. Common foods that cause symptoms include: ? Tomatoes. ? Oranges, lemons, and limes. ? Peppers. ? Spicy food. ? Onions and garlic. ? Vinegar. Lifestyle  Maintain a healthy weight. Ask your doctor what weight is healthy for you. If you need to lose weight, work with your doctor to do so safely.  Exercise for at least 30 minutes for 5 or more days each week, or as told by your doctor.  Wear loose-fitting clothes.  Do not smoke. If you need help quitting, ask your doctor.  Sleep with the head of your bed higher than your feet. Use a wedge under the mattress or blocks under the bed frame to raise the head of the bed. Summary  When you have gastroesophageal reflux disease (GERD), food and lifestyle choices are very important in easing your symptoms.  Eat small meals often instead of 3 large meals a day. Eat your meals slowly, and in a place where you are relaxed.  Limit high-fat foods such as fatty meat or fried foods.  Avoid bending over or lying down until 2-3 hours after eating.  Avoid peppermint and spearmint, caffeine,  alcohol, and chocolate. This information is not intended to replace advice given to you by your health care provider. Make sure you discuss any questions you have with your health care provider. Document Revised: 09/04/2018 Document Reviewed: 06/19/2016 Elsevier Patient Education  [place].

## 2020-06-08 ENCOUNTER — Other Ambulatory Visit: Payer: Self-pay | Admitting: *Deleted

## 2020-06-08 DIAGNOSIS — I10 Essential (primary) hypertension: Secondary | ICD-10-CM

## 2020-06-08 DIAGNOSIS — R739 Hyperglycemia, unspecified: Secondary | ICD-10-CM

## 2020-06-20 ENCOUNTER — Telehealth: Payer: Self-pay | Admitting: *Deleted

## 2020-06-20 NOTE — Telephone Encounter (Signed)
Left message on voicemail to call office.  

## 2020-06-21 ENCOUNTER — Telehealth: Payer: Self-pay | Admitting: *Deleted

## 2020-06-21 ENCOUNTER — Ambulatory Visit: Payer: Medicare Other | Admitting: Physician Assistant

## 2020-06-21 ENCOUNTER — Ambulatory Visit (INDEPENDENT_AMBULATORY_CARE_PROVIDER_SITE_OTHER): Payer: Medicare Other

## 2020-06-21 ENCOUNTER — Ambulatory Visit: Payer: Self-pay

## 2020-06-21 ENCOUNTER — Other Ambulatory Visit: Payer: Self-pay

## 2020-06-21 ENCOUNTER — Ambulatory Visit (INDEPENDENT_AMBULATORY_CARE_PROVIDER_SITE_OTHER): Payer: Medicare Other | Admitting: Family Medicine

## 2020-06-21 ENCOUNTER — Encounter: Payer: Self-pay | Admitting: Family Medicine

## 2020-06-21 VITALS — BP 104/62 | HR 53 | Ht 72.0 in | Wt 219.6 lb

## 2020-06-21 DIAGNOSIS — M25521 Pain in right elbow: Secondary | ICD-10-CM

## 2020-06-21 DIAGNOSIS — Z89511 Acquired absence of right leg below knee: Secondary | ICD-10-CM

## 2020-06-21 DIAGNOSIS — M19021 Primary osteoarthritis, right elbow: Secondary | ICD-10-CM | POA: Diagnosis not present

## 2020-06-21 NOTE — Progress Notes (Signed)
Subjective:    I'm seeing this patient as a consultation for:  Len Blalock, [place] and Dr. Jerline Pain. Note will be routed back to referring provider/PCP.  CC: R elbow pain  I, Molly Weber, LAT, ATC, am serving as scribe for Dr. Lynne Leader.  HPI: Pt is an 80 y/o male presenting w/ c/o R elbow pain x one month w/ no known MOI.  He locates his pain to his R post elbow / distal tricep.   Patient has a right below the knee amputation since 2018.  In the evening he does not wear his prosthesis and uses a wheelchair.  He pushes up with his arms to transition in the evening.  Radiating pain: No R elbow swelling: No Aggravating factors: pushing up to stand using his R arm; R shoulder functional ER as in trying to scratch his upper back; pressure to his L olecranon Treatments tried: elbow sleeve  Past medical history, Surgical history, Family history, Social history, Allergies, and medications have been entered into the medical record, reviewed.   Review of Systems: No new headache, visual changes, nausea, vomiting, diarrhea, constipation, dizziness, abdominal pain, skin rash, fevers, chills, night sweats, weight loss, swollen lymph nodes, body aches, joint swelling, muscle aches, chest pain, shortness of breath, mood changes, visual or auditory hallucinations.   Objective:    Vitals:   06/21/20 1314  BP: 104/62  Pulse: (!) 53  SpO2: 97%   General: Well Developed, well nourished, and in no acute distress.  Neuro/Psych: Alert and oriented x3, extra-ocular muscles intact, able to move all 4 extremities, sensation grossly intact. Skin: Warm and dry, no rashes noted.  Respiratory: Not using accessory muscles, speaking in full sentences, trachea midline.  Cardiovascular: Pulses palpable, no extremity edema. Abdomen: Does not appear distended. MSK: Right elbow normal-appearing Normal motion. Mildly tender palpation posterior elbow at triceps insertion on olecranon. Intact strength some pain with  resisted elbow extension. Pulses cap refill and sensation are intact distal bilateral upper extremities.  Lab and Radiology Results  Diagnostic Limited MSK Ultrasound of: Right elbow Posterior elbow triceps tendon intact at olecranon insertion.  Slight hyperechoic change at distal tendon insertion consistent with chronic calcific tendinopathy. Lateral epicondyle shows calcification is superficial tip consistent with lateral epicondylitis.   Medial epicondyle is normal Impression: Triceps tendinitis and lateral epicondylitis  X-ray images right elbow obtained today personally independently interpreted No fractures.  Mild degenerative changes. Await formal radiology review   Impression and Recommendations:    Assessment and Plan: 80 y.o. male with triceps tendinitis right elbow..  Plan for physical therapy and Voltaren gel.  Recheck back in 6 to 8 weeks..  Return sooner if needed.  PDMP not reviewed this encounter. Orders Placed This Encounter  Procedures  . [place] LIMITED JOINT SPACE STRUCTURES UP RIGHT(NO LINKED CHARGES)    Order Specific Question:   Reason for Exam (SYMPTOM  OR DIAGNOSIS REQUIRED)    Answer:   R elbow pain    Order Specific Question:   Preferred imaging location?    Answer:   [place]  . DG ELBOW COMPLETE RIGHT (3+VIEW)    Standing Status:   Future    Number of Occurrences:   1    Standing Expiration Date:   06/21/2021    Order Specific Question:   Reason for Exam (SYMPTOM  OR DIAGNOSIS REQUIRED)    Answer:   eval elbow    Order Specific Question:   Preferred imaging location?    Answer:  [facility]  . Ambulatory referral to Physical Therapy    Referral Priority:   Routine    Referral Type:   Physical Medicine    Referral Reason:   Specialty Services Required    Requested Specialty:   Physical Therapy   No orders of the defined types were placed in this encounter.   Discussed warning signs or symptoms. Please see  discharge instructions. Patient expresses understanding.   The above documentation has been reviewed and is accurate and complete Lynne Leader, M.D.

## 2020-06-21 NOTE — Patient Instructions (Signed)
Thank you for coming in today.  Please get an Xray today before you leave  I've referred you to Physical Therapy.  Let us know if you don't hear from them in one week.  Please use voltaren gel up to 4x daily for pain as needed.   Recheck with me in about 6-8 weeks.   Let me know if you are having a problem with PT or if you are getting worse.

## 2020-06-21 NOTE — Telephone Encounter (Signed)
Left message on voicemail to call office.  

## 2020-06-21 NOTE — Telephone Encounter (Signed)
Pt called back, told him Aldona Bar would like him to see Sports Medicine today instead of her, they have openings this afternoon. Pt said that would be fine what time. Told him there is an opening at 1:30. He said that would be fine. Told him okay will cancel appt here and gave pt address for Dr. Georgina Snell. Pt verbalized understanding.

## 2020-06-22 NOTE — Progress Notes (Signed)
X-ray right elbow shows medium arthritis

## 2020-06-30 ENCOUNTER — Other Ambulatory Visit: Payer: Self-pay

## 2020-06-30 ENCOUNTER — Ambulatory Visit (INDEPENDENT_AMBULATORY_CARE_PROVIDER_SITE_OTHER): Payer: Medicare Other | Admitting: Physical Therapy

## 2020-06-30 ENCOUNTER — Encounter: Payer: Self-pay | Admitting: Physical Therapy

## 2020-06-30 DIAGNOSIS — M6281 Muscle weakness (generalized): Secondary | ICD-10-CM

## 2020-06-30 DIAGNOSIS — M25521 Pain in right elbow: Secondary | ICD-10-CM | POA: Diagnosis not present

## 2020-06-30 NOTE — Patient Instructions (Signed)
Access Code: [facility] URL: [email or web address] Date: 06/30/2020 Prepared by: Lyndee Hensen  Exercises Seated Scapular Retraction - 2 x daily - 1 sets - 10 reps Sit to Stand with Armchair

## 2020-07-03 ENCOUNTER — Other Ambulatory Visit: Payer: Self-pay | Admitting: Family Medicine

## 2020-07-05 ENCOUNTER — Telehealth: Payer: Self-pay

## 2020-07-05 NOTE — Telephone Encounter (Signed)
-----   Message from April D Calhoun sent at 07/05/2020  3:00 PM EST ----- Patient states he has been having problems with his left foot swelling. Patient states he is really concerned about this as this is how he lost his right leg. Patient states this has been ongoing and is not improving. Patient states he would like for Madelin Rear, CPP to send a message to Dr. Jerline Pain to see if he can get an appointment for further evaluation. Patient states "I don't want to lose my other leg". He wants to make sure his blood is circulating okay through his leg.

## 2020-07-05 NOTE — Chronic Care Management (AMB) (Signed)
Chronic Care Management Pharmacy Assistant   Name: [name]  MRN: [id number] DOB: [birth year]  Reason for Encounter: Disease State/ Hypertension Adherence Call  Patient Questions:  1.  Have you seen any other providers since your last visit? Yes, 06/21/2020 [facility], Gregor Hams, MD  2.  Any changes in your medicines or health? Patient states he has been having troubles with his right elbow. Patient states he uses his upper extremities to help himself get up and down as he has a right prothesis and is mostly in a wheelchair.   PCP : Vivi Barrack, MD  Allergies:   Allergies  Allergen Reactions  . Entresto [Sacubitril-Valsartan]     Intolerant due to frequent urination  . Lisinopril Cough    Medications: Outpatient Encounter Medications as of 07/05/2020  Medication Sig  . Accu-Chek FastClix Lancets MISC USE TO CHECK BLOOD SUGAR DAILY AND PRN  . aspirin 325 MG tablet Take 325 mg by mouth daily.  Marland Kitchen aspirin-sod bicarb-citric acid (ALKA-SELTZER) 325 MG TBEF tablet Take 650 mg by mouth daily as needed (heart burn).  . carvedilol (COREG) 25 MG tablet TAKE 1 TABLET BY MOUTH TWICE A DAY  . ENTRESTO 49-51 MG TAKE 1 TABLET BY MOUTH TWICE A DAY  . gabapentin (NEURONTIN) 100 MG capsule TAKE 1 CAPSULE BY MOUTH THREE TIMES A DAY  . glucose blood (ACCU-CHEK AVIVA PLUS) test strip USE TO TEST Glucose ONCE DAILY E11.9  . halobetasol (ULTRAVATE) 0.05 % cream APPLY TO AFFECTED AREA TWICE A DAY  . hydrocortisone cream 1 % Apply 1 application topically daily as needed for itching.  . loratadine (CLARITIN) 10 MG tablet Take 10 mg by mouth daily as needed for allergies.   Marland Kitchen LORazepam (ATIVAN) 0.5 MG tablet Take 1 tablet (0.5 mg total) by mouth every 8 (eight) hours as needed for anxiety.  . metFORMIN (GLUCOPHAGE) 1000 MG tablet TAKE 1 TABLET BY MOUTH EVERY DAY WITH BREAKFAST  . Multiple Vitamin (MULTIVITAMIN) tablet Take 1 tablet by mouth daily.  Marland Kitchen nystatin cream (MYCOSTATIN) APPLY  TO AFFECTED AREA TWICE A DAY  . pantoprazole (PROTONIX) 40 MG tablet TAKE 1 TABLET BY MOUTH EVERY DAY  . potassium chloride SA (KLOR-CON M20) 20 MEQ tablet TAKE 2 TABLETS BY MOUTH IN THE MORNING  . sildenafil (VIAGRA) 100 MG tablet TAKE 1 TABLET DAILY AS NEEDED ED  . solifenacin (VESICARE) 10 MG tablet Take 10 mg by mouth daily.  . tadalafil (CIALIS) 10 MG tablet Take 0.5-1 tablets (5-10 mg total) by mouth daily as needed for erectile dysfunction.  . tamsulosin (FLOMAX) 0.4 MG CAPS capsule TAKE 1 CAPSULE BY MOUTH EVERY DAY  . Tetrahydrozoline HCl (VISINE OP) Place 1 drop into both eyes daily as needed (dry eyes).  . traMADol (ULTRAM) 50 MG tablet Take 1 tablet (50 mg total) by mouth every 6 (six) hours as needed for moderate pain.   No facility-administered encounter medications on file as of 07/05/2020.    Current Diagnosis: Patient Active Problem List   Diagnosis Date Noted  . Gastroesophageal reflux disease 12/08/2019  . Dermatitis 03/10/2018  . Benign prostatic hyperplasia with nocturia 11/12/2017  . Nonischemic cardiomyopathy ([place]) 09/03/2017  . AVNRT (AV nodal re-entry tachycardia) ([place]) 01/30/2017  . Chronic midline low back pain without sciatica 01/03/2017  . H/O amputation of leg through tibia and fibula, right ([place]) 05/03/2016  . Phantom limb pain ([place]) 03/06/2016  . SVT (supraventricular tachycardia) ([place]) 01/20/2016  . Chronic systolic heart failure ([name])  01/20/2016  . Esophageal dysphagia 08/06/2014  . Anxiety state 10/26/2013  . Hyperglycemia 11/02/2010  . BASAL CELL CARCINOMA, FACE 10/18/2008  . LIPOMA, SKIN 05/31/2008  . ERECTILE DYSFUNCTION 09/30/2007  . OSTEOARTHRITIS 08/08/2007  . Essential hypertension 03/24/2007    Reviewed chart prior to disease state call. Spoke with patient regarding BP  Recent Office Vitals: BP Readings from Last 3 Encounters:  06/21/20 104/62  03/11/20 128/73  01/05/20 118/72   Pulse Readings from Last 3 Encounters:  06/21/20 (!)  53  03/11/20 (!) 54  01/05/20 64    Wt Readings from Last 3 Encounters:  06/21/20 219 lb 9.6 oz (99.6 kg)  03/11/20 226 lb (102.5 kg)  01/05/20 223 lb (101.2 kg)     Kidney Function Lab Results  Component Value Date/Time   CREATININE 0.68 09/04/2019 09:18 AM   CREATININE 0.79 07/10/2018 10:39 AM   CREATININE 0.79 01/23/2016 12:03 PM   GFR 112.24 09/04/2019 09:18 AM   GFRNONAA 86 07/10/2018 10:39 AM   GFRAA 99 07/10/2018 10:39 AM    BMP Latest Ref Rng & Units 09/04/2019 07/10/2018 06/04/2018  Glucose 70 - 99 mg/dL 100(H) 164(H) 115(H)  BUN 6 - 23 mg/dL 17 15 14   Creatinine 0.40 - 1.50 mg/dL 0.68 0.79 0.76  BUN/Creat Ratio 10 - 24 - 19 -  Sodium 135 - 145 mEq/L 140 140 138  Potassium 3.5 - 5.1 mEq/L 4.6 4.5 3.8  Chloride 96 - 112 mEq/L 103 101 105  CO2 19 - 32 mEq/L 28 22 25   Calcium 8.4 - 10.5 mg/dL 9.3 9.1 8.7(L)    . Current antihypertensive regimen:  o Carvedilol 25 mg tablet twice daily o Entresto 49-51 mg tablet twice daily  . How often are you checking your Blood Pressure? daily   . Current home BP readings: 129/69, 117/65, 134/78  . What recent interventions/DTPs have been made by any provider to improve Blood Pressure control since last CPP Visit: Patient states he is currently taking his medications as prescribed.  . Any recent hospitalizations or ED visits since last visit with CPP? No , patient has not had any hospitalizations or ED visits since his last visit with Madelin Rear, CPP.  Marland Kitchen What diet changes have been made to improve Blood Pressure Control?  o Patient states he has been working on cutting back on eating chocolate for snack. Patient states he does eat them sometimes but it's sugar free chocolate.  . What exercise is being done to improve your Blood Pressure Control?  o Patient states he hasn't been about to golf since he lost his leg. Patient states he has an exercise machine he uses every other day for 30 minutes that exercises his arms and  leg.  Adherence Review: Is the patient currently on ACE/ARB medication? Yes Does the patient have >5 day gap between last estimated fill dates? No  Patient reports his most recent glucose readings: 89, 107, 114. Patient states these were taken in the morning before breakfast.  Patient reports his most recent weight: 207, 208, 209  Patient states he has been having problems with his left foot swelling. Patient states he is really concerned about this as this is how he lost his right leg. Patient states this has been ongoing and is not improving. Patient states he would like for Madelin Rear, CPP to send a message to Dr. Jerline Pain to see if he can get an appointment for further evaluation. Patient states "I don't want to lose my other leg". He  wants to make sure his blood is circulating okay through his left leg.   Future Appointments  Date Time Provider [place]  08/09/2020  1:00 PM Gregor Hams, MD [facility] None  02/21/2021  1:30 PM [facility] CCM PHARMACIST [facility]    April D Calhoun, [place] Pharmacist Assistant [phone]   Follow-Up:  Pharmacist Review

## 2020-07-05 NOTE — Telephone Encounter (Signed)
Left voice message to return call to schedule appointment with Dr Jerline Pain

## 2020-07-07 ENCOUNTER — Other Ambulatory Visit: Payer: Self-pay

## 2020-07-07 ENCOUNTER — Ambulatory Visit (INDEPENDENT_AMBULATORY_CARE_PROVIDER_SITE_OTHER): Payer: Medicare Other | Admitting: Family Medicine

## 2020-07-07 ENCOUNTER — Encounter: Payer: Self-pay | Admitting: Family Medicine

## 2020-07-07 VITALS — BP 119/66 | HR 55 | Temp 97.9°F | Ht 72.0 in | Wt 218.0 lb

## 2020-07-07 DIAGNOSIS — I5022 Chronic systolic (congestive) heart failure: Secondary | ICD-10-CM | POA: Diagnosis not present

## 2020-07-07 DIAGNOSIS — R351 Nocturia: Secondary | ICD-10-CM

## 2020-07-07 DIAGNOSIS — B351 Tinea unguium: Secondary | ICD-10-CM | POA: Diagnosis not present

## 2020-07-07 DIAGNOSIS — L0390 Cellulitis, unspecified: Secondary | ICD-10-CM

## 2020-07-07 DIAGNOSIS — N401 Enlarged prostate with lower urinary tract symptoms: Secondary | ICD-10-CM

## 2020-07-07 LAB — CBC
HCT: 41.3 % (ref 39.0–52.0)
Hemoglobin: 14.2 g/dL (ref 13.0–17.0)
MCHC: 34.3 g/dL (ref 30.0–36.0)
MCV: 88 fl (ref 78.0–100.0)
Platelets: 206 10*3/uL (ref 150.0–400.0)
RBC: 4.69 Mil/uL (ref 4.22–5.81)
RDW: 13.6 % (ref 11.5–15.5)
WBC: 4.9 10*3/uL (ref 4.0–10.5)

## 2020-07-07 LAB — COMPREHENSIVE METABOLIC PANEL
ALT: 12 U/L (ref 0–53)
AST: 12 U/L (ref 0–37)
Albumin: 4.1 g/dL (ref 3.5–5.2)
Alkaline Phosphatase: 61 U/L (ref 39–117)
BUN: 17 mg/dL (ref 6–23)
CO2: 27 mEq/L (ref 19–32)
Calcium: 9.2 mg/dL (ref 8.4–10.5)
Chloride: 108 mEq/L (ref 96–112)
Creatinine, Ser: 0.86 mg/dL (ref 0.40–1.50)
GFR: 81.66 mL/min (ref >60.00)
Glucose, Bld: 106 mg/dL — ABNORMAL HIGH (ref 70–99)
Potassium: 4.3 mEq/L (ref 3.5–5.1)
Sodium: 143 mEq/L (ref 135–145)
Total Bilirubin: 0.7 mg/dL (ref 0.2–1.2)
Total Protein: 6.6 g/dL (ref 6.0–8.3)

## 2020-07-07 LAB — TSH: TSH: 1.37 u[IU]/mL (ref 0.35–4.50)

## 2020-07-07 MED ORDER — DOXYCYCLINE HYCLATE 100 MG PO TABS: 100.0000 mg | ORAL_TABLET | Freq: Two times a day (BID) | ORAL | 0 refills | Status: DC

## 2020-07-07 MED ORDER — FUROSEMIDE 20 MG PO TABS: 20.0000 mg | ORAL_TABLET | Freq: Every day | ORAL | 3 refills | Status: DC | PRN

## 2020-07-07 MED ORDER — FUROSEMIDE 20 MG PO TABS: 20.0000 mg | ORAL_TABLET | Freq: Every day | ORAL | 3 refills | Status: DC

## 2020-07-07 NOTE — Progress Notes (Signed)
   [name] is a 80 y.o. male who presents today for an office visit.  Assessment/Plan:  New/Acute Problems: Cellulitis We will start doxycycline.  No signs of systemic symptoms.  He will let me know if not improving.  Low suspicion for osteomyelitis at this point though he will let me know if symptoms not improved.  Onychomycosis We will place referral to podiatry.  Chronic Problems Addressed Today: Chronic systolic heart failure ([place]) He has more lower extremity edema but otherwise no signs of volume overload.  Patient has been monitoring his sodium intake.  Discussed importance of reduction in salt intake, compression, and elevation.  We will send in furosemide 20 mg daily to use as needed for edema.  He has never been on a loop diuretic in the past that he is aware of.  Will check labs today.  Encouraged him to follow-up with cardiology soon.  Benign prostatic hyperplasia with nocturia Stable on flomax 0.4mg  daily.       Subjective:  HPI:  Patient here with left foot pain and swelling for the past day or so.  He was trying to trim his nails yesterday and clipped his second toe.  Has had some more redness to the area.  Swelling comes and goes.  Can sometimes go all the way up to his knee.  He has been taking more salt recently.  Has never been on a fluid pill in the past.  No fevers or chills.  No nausea or vomiting.  No reported chest pain or shortness of breath.  He has a history of right leg amputation.       Objective:  Physical Exam: BP 119/66   Pulse (!) 55   Temp 97.9 F (36.6 C) (Temporal)   Ht 6' (1.829 m)   Wt 218 lb (98.9 kg)   SpO2 96%   BMI 29.57 kg/m   Wt Readings from Last 3 Encounters:  07/07/20 218 lb (98.9 kg)  06/21/20 219 lb 9.6 oz (99.6 kg)  03/11/20 226 lb (102.5 kg)  Gen: No acute distress, resting comfortably CV: Regular rate and rhythm with no murmurs appreciated Pulm: Normal work of breathing, clear to auscultation bilaterally with no  crackles, wheezes, or rhonchi MSK: Second and third digit on left foot erythematous.  Small laceration noted on distal aspect of left second digit.  No purulence noted.  Left lower extremity with 2+ pitting edema to mid shin.  Onychomycosis noted. Neuro: Grossly normal, moves all extremities Psych: Normal affect and thought content      [name] M. Jerline Pain, MD 07/07/2020 9:51 AM

## 2020-07-07 NOTE — Assessment & Plan Note (Signed)
Stable on flomax 0.4mg  daily.

## 2020-07-07 NOTE — Patient Instructions (Signed)
It was very nice to see you today!  Please start the antibiotic.  I will also place a referral for you to see the foot doctor.  We will check blood work today.  Please try to cut down on your salt intake.  You can take the fluid pill as needed for swelling.  Please keep your legs elevated and use compression stockings as tolerated.  Please follow-up with your cardiologist soon.  Take care, Dr Jerline Pain  Please try these tips to maintain a healthy lifestyle:   Eat at least 3 REAL meals and 1-2 snacks per day.  Aim for no more than 5 hours between eating.  If you eat breakfast, please do so within one hour of getting up.    Each meal should contain half fruits/vegetables, one quarter protein, and one quarter carbs (no bigger than a computer mouse)   Cut down on sweet beverages. This includes juice, soda, and sweet tea.     Drink at least 1 glass of water with each meal and aim for at least 8 glasses per day   Exercise at least 150 minutes every week.

## 2020-07-07 NOTE — Assessment & Plan Note (Signed)
He has more lower extremity edema but otherwise no signs of volume overload.  Patient has been monitoring his sodium intake.  Discussed importance of reduction in salt intake, compression, and elevation.  We will send in furosemide 20 mg daily to use as needed for edema.  He has never been on a loop diuretic in the past that he is aware of.  Will check labs today.  Encouraged him to follow-up with cardiology soon.

## 2020-07-08 NOTE — Progress Notes (Signed)
Please inform patient of the following:  Labs are all stable.  Algis Greenhouse. Jerline Pain, MD 07/08/2020 9:14 AM

## 2020-07-10 ENCOUNTER — Encounter: Payer: Self-pay | Admitting: Physical Therapy

## 2020-07-10 NOTE — Therapy (Addendum)
[facility] [address] [place], [place], [address] Phone: [phone]   Fax:  [phone]  Physical Therapy Evaluation  Patient Details  Name: [name] MRN: [id number] Date of Birth: [birth year] Referring Provider (PT): Lynne Leader   Encounter Date: 06/30/2020   PT End of Session - 07/10/20 1947     Visit Number 1    Number of Visits 12    Date for PT Re-Evaluation 08/11/20    Authorization Type Medicare    PT Start Time 1100    PT Stop Time 1141    PT Time Calculation (min) 41 min    Activity Tolerance Patient tolerated treatment well    Behavior During Therapy [facility] for tasks assessed/performed             Past Medical History:  Diagnosis Date   CAD (coronary artery disease)    a. Remote cath 2008 for chest pain showed 30% LAD with luminal irregularity and fairly heavy calcification in the mid LAD without critical stenosis, 30% OM1, 30% acute marginal.   Cancer (HCC)    skin cancer - basal   Cataract    both   Chronic combined systolic and diastolic CHF (congestive heart failure) (HCC)    Complication of anesthesia    Diabetes mellitus    Type II   ED (erectile dysfunction)    History of kidney stones    x1   Hypertension    NICM (nonischemic cardiomyopathy) ([place])    a. EF 40-45% by echo in 08/2017 - prior low risk stress test in 2017.   Osteoarthritis    Paroxysmal SVT (supraventricular tachycardia) (HCC)    PONV (postoperative nausea and vomiting) 1960   with Ether   PVC's (premature ventricular contractions)    PVD (peripheral vascular disease) (HCC)    s/p R BKA   Rheumatic fever    Rhinitis    S/P BKA (below knee amputation), right [facility])     Past Surgical History:  Procedure Laterality Date   AMPUTATION Right 02/29/2016   Procedure: Right Leg AMPUTATION BELOW KNEE with Wound Vac placement;  Surgeon: [name], MD;  Location: [facility];  Service: Orthopedics;  Laterality: Right;   CHOLECYSTECTOMY   1983   colonoscopy  2006, 2010   EYE SURGERY Bilateral    cataract   INGUINAL HERNIA REPAIR Right 1960   LEFT HEART CATH AND CORONARY ANGIOGRAPHY N/A 06/03/2018   Procedure: LEFT HEART CATH AND CORONARY ANGIOGRAPHY;  Surgeon: Belva Crome, MD;  Location: [place] CV LAB;  Service: Cardiovascular;  Laterality: N/A;   STUMP REVISION Right 10/30/2017   Procedure: RIGHT BELOW KNEE AMPUTATION REVISION;  Surgeon: [name], MD;  Location: [place];  Service: Orthopedics;  Laterality: Right;    There were no vitals filed for this visit.    Subjective Assessment - 07/10/20 1945     Subjective Pt had increase in R elbow/tricep pain for about 2 weeks. States today that pain there has resolved, has not felt much in last 2 days. Pt states some difficulty with using R UE to push up from chair. He had a R BKA in 2017. He uses cane mostly during the day for ambulation, but uses wheelchair at night to go to bathroom during the night. He currently is not doing any strenthening/exercise for UE or LE, but is using nustep type machine at home.    Pertinent History R BKA    Limitations Lifting;House hold activities  Patient Stated Goals decreased pain in elbow,    Currently in Pain? No/denies                [facility] PT Assessment - 07/10/20 0001       Assessment   Medical Diagnosis R elbow pain    Referring Provider (PT) Lynne Leader    Hand Dominance Right    Prior Therapy no      Precautions   Precaution Comments uses wheelchair at night/ R BKA      Balance Screen   Has the patient fallen in the past 6 months No      Prior Function   Level of Independence Independent      Cognition   Overall Cognitive Status Within Functional Limits for tasks assessed      ROM / Strength   AROM / PROM / Strength AROM;Strength      AROM   Overall AROM Comments R elbow: WNL, shoulders: WNL, Hips: [facility]      Strength   Overall Strength Comments R hip: 4-/5, L: 4/5/  UE: 4 to 4+/5 gross       Palpation   Palpation comment no tenderness to palpate R elbow      Transfers   Comments much relyance on UEs for sit to stand, rounded shoulders, significant shoulder IR                        Objective measurements completed on examination: See above findings.       [facility] Adult PT Treatment/Exercise - 07/10/20 0001       Exercises   Exercises Shoulder;Knee/Hip      Knee/Hip Exercises: Seated   Sit to Sand 15 reps      Shoulder Exercises: Seated   Retraction 15 reps                    PT Education - 07/10/20 1946     Education Details PT POC, Exam findings    Person(s) Educated Patient    Methods Explanation;Demonstration;Tactile cues;Verbal cues;Handout    Comprehension Verbalized understanding;Verbal cues required;Returned demonstration;Tactile cues required;Need further instruction              PT Short Term Goals - 07/10/20 1951       PT SHORT TERM GOAL #1   Title Pt to be independent with initial HEP               PT Long Term Goals - 07/10/20 1952       PT LONG TERM GOAL #1   Title Pt to be independent with final HEP for UE, LE strength    Time 6    Period Weeks    Status New    Target Date 08/11/20      PT LONG TERM GOAL #2   Title Pt to demo ability for sit to stand transfer with proper mechanics, consistently on 1st attempt.    Time 6    Period Weeks    Status New    Target Date 08/11/20                    Plan - 07/10/20 1957     Clinical Impression Statement Pt presents wtih primary complaint of R elbow pain, which has resolved since MD appt. Pt sates no pain in elbow today. He does have poor shoulder and upper body posture, with rounded and IR shoulders. Pt overall deconditioned, with  noted weakness for functional muscles, especially in R hip and shoulders. Pt to benefit from education on HEP for his UE and LE strength deficits, given that he has below knee amputee, and is more reliant on UE  support. Pt to benefit from short duration PT for review of best ther ex for UE and LE strengthening for improving functional activitiy and transfer ability.    Personal Factors and Comorbidities Fitness    Examination-Activity Limitations Locomotion Level;Transfers    Examination-Participation Restrictions Cleaning;Community Activity    Stability/Clinical Decision Making Stable/Uncomplicated    Clinical Decision Making Low    Rehab Potential Good    PT Frequency 2x / week    PT Duration 6 weeks    PT Treatment/Interventions ADLs/Self Care Home Management;DME Instruction;Moist Heat;Iontophoresis 95m/ml Dexamethasone;Gait training;Stair training;Functional mobility training;Patient/family education;Therapeutic exercise;Therapeutic activities;Balance training;Prosthetic Training;Neuromuscular re-education;Manual techniques;Passive range of motion;Taping;Vasopneumatic Device;Dry needling;Joint Manipulations;Spinal Manipulations    PT Home Exercise Plan [facility]   Consulted and Agree with Plan of Care Patient             Patient will benefit from skilled therapeutic intervention in order to improve the following deficits and impairments:  Abnormal gait,Difficulty walking,Decreased activity tolerance,Decreased balance,Improper body mechanics,Decreased mobility,Decreased strength  Visit Diagnosis: Muscle weakness (generalized)  Pain in right elbow     Problem List Patient Active Problem List   Diagnosis Date Noted   Gastroesophageal reflux disease 12/08/2019   Dermatitis 03/10/2018   Benign prostatic hyperplasia with nocturia 11/12/2017   Nonischemic cardiomyopathy ([place] 09/03/2017   AVNRT (AV nodal re-entry tachycardia) ([place] 01/30/2017   Chronic midline low back pain without sciatica 01/03/2017   H/O amputation of leg through tibia and fibula, right ([place] 05/03/2016   Phantom limb pain ([place] 03/06/2016   SVT (supraventricular tachycardia) ([place] 060/47/9987  Chronic systolic  heart failure ([place] 01/20/2016   Esophageal dysphagia 08/06/2014   Anxiety state 10/26/2013   Hyperglycemia 11/02/2010   BASAL CELL CARCINOMA, FACE 10/18/2008   LIPOMA, SKIN 05/31/2008   ERECTILE DYSFUNCTION 09/30/2007   OSTEOARTHRITIS 08/08/2007   Essential hypertension 03/24/2007    LLyndee Hensen PT, DPT 9:42 PM  07/10/20    [place] [address]Phone: [phone]  Fax:  [phone] Name: DTHADIUS SMISEKMRN: [id number] of Birth: 503/04/1941 PHYSICAL THERAPY DISCHARGE SUMMARY  Visits from Start of Care: 1   Plan: Patient agrees to discharge.  Patient goals were partially   met. Patient is being discharged due to - not returning since last visit.     LLyndee Hensen PT, DPT 3:53 PM  02/26/22

## 2020-07-12 ENCOUNTER — Ambulatory Visit (INDEPENDENT_AMBULATORY_CARE_PROVIDER_SITE_OTHER): Payer: Medicare Other | Admitting: Podiatry

## 2020-07-12 ENCOUNTER — Other Ambulatory Visit: Payer: Self-pay

## 2020-07-12 DIAGNOSIS — M79674 Pain in right toe(s): Secondary | ICD-10-CM | POA: Diagnosis not present

## 2020-07-12 DIAGNOSIS — M2042 Other hammer toe(s) (acquired), left foot: Secondary | ICD-10-CM

## 2020-07-12 DIAGNOSIS — M2041 Other hammer toe(s) (acquired), right foot: Secondary | ICD-10-CM | POA: Diagnosis not present

## 2020-07-12 DIAGNOSIS — S91116A Laceration without foreign body of unspecified lesser toe(s) without damage to nail, initial encounter: Secondary | ICD-10-CM | POA: Diagnosis not present

## 2020-07-12 DIAGNOSIS — M79675 Pain in left toe(s): Secondary | ICD-10-CM

## 2020-07-12 DIAGNOSIS — B351 Tinea unguium: Secondary | ICD-10-CM

## 2020-07-12 DIAGNOSIS — E1149 Type 2 diabetes mellitus with other diabetic neurological complication: Secondary | ICD-10-CM

## 2020-07-12 MED ORDER — MUPIROCIN 2 % EX OINT: 1.0000 "application " | TOPICAL_OINTMENT | Freq: Two times a day (BID) | CUTANEOUS | 2 refills | Status: DC

## 2020-07-16 NOTE — Progress Notes (Addendum)
Subjective:   Patient ID: [name], male   DOB: 80 y.o.   MRN: [id number]   HPI 80 year old male with history of type 2 diabetes with last A1c of 5.4 presents the office today for concerns of his nails becoming thickened discolored he cannot trim them himself.  He actually recently try to cut his toenails himself and he cut his left second toe.  There is dried blood around the toenail.  I have given this he has no other wounds that he reports.  He is also interested in diabetic shoes.  He has no other concerns today.  History of right BKA   Review of Systems  All other systems reviewed and are negative.  Past Medical History:  Diagnosis Date  . CAD (coronary artery disease)    a. Remote cath 2008 for chest pain showed 30% LAD with luminal irregularity and fairly heavy calcification in the mid LAD without critical stenosis, 30% OM1, 30% acute marginal.  . Cancer (HCC)    skin cancer - basal  . Cataract    both  . Chronic combined systolic and diastolic CHF (congestive heart failure) ([place])   . Complication of anesthesia   . Diabetes mellitus    Type II  . ED (erectile dysfunction)   . History of kidney stones    x1  . Hypertension   . NICM (nonischemic cardiomyopathy) ([place])    a. EF 40-45% by echo in 08/2017 - prior low risk stress test in 2017.  . Osteoarthritis   . Paroxysmal SVT (supraventricular tachycardia) ([place])   . PONV (postoperative nausea and vomiting) 1960   with Ether  . PVC's (premature ventricular contractions)   . PVD (peripheral vascular disease) ([place])    s/p R BKA  . Rheumatic fever   . Rhinitis   . S/P BKA (below knee amputation), right [facility])     Past Surgical History:  Procedure Laterality Date  . AMPUTATION Right 02/29/2016   Procedure: Right Leg AMPUTATION BELOW KNEE with Wound Vac placement;  Surgeon: [name], MD;  Location: [place];  Service: Orthopedics;  Laterality: Right;  . CHOLECYSTECTOMY  1983  . colonoscopy  2006, 2010  . EYE SURGERY  Bilateral    cataract  . INGUINAL HERNIA REPAIR Right 1960  . LEFT HEART CATH AND CORONARY ANGIOGRAPHY N/A 06/03/2018   Procedure: LEFT HEART CATH AND CORONARY ANGIOGRAPHY;  Surgeon: Belva Crome, MD;  Location: [place] [facility];  Service: Cardiovascular;  Laterality: N/A;  . STUMP REVISION Right 10/30/2017   Procedure: RIGHT BELOW KNEE AMPUTATION REVISION;  Surgeon: [name], MD;  Location: [place];  Service: Orthopedics;  Laterality: Right;     Current Outpatient Medications:  .  mupirocin ointment (BACTROBAN) 2 %, Apply 1 application topically 2 (two) times daily., Disp: 30 g, Rfl: 2 .  Accu-Chek FastClix Lancets MISC, USE TO CHECK BLOOD SUGAR DAILY AND PRN, Disp: 102 each, Rfl: 4 .  aspirin 325 MG tablet, Take 325 mg by mouth daily., Disp: , Rfl:  .  aspirin-sod bicarb-citric acid (ALKA-SELTZER) 325 MG TBEF tablet, Take 650 mg by mouth daily as needed (heart burn)., Disp: , Rfl:  .  carvedilol (COREG) 25 MG tablet, TAKE 1 TABLET BY MOUTH TWICE A DAY, Disp: 180 tablet, Rfl: 3 .  doxycycline (VIBRA-TABS) 100 MG tablet, Take 1 tablet (100 mg total) by mouth 2 (two) times daily., Disp: 14 tablet, Rfl: 0 .  ENTRESTO 49-51 MG, TAKE 1 TABLET BY MOUTH TWICE  A DAY, Disp: 60 tablet, Rfl: 5 .  furosemide (LASIX) 20 MG tablet, Take 1 tablet (20 mg total) by mouth daily as needed for edema., Disp: 30 tablet, Rfl: 3 .  gabapentin (NEURONTIN) 100 MG capsule, TAKE 1 CAPSULE BY MOUTH THREE TIMES A DAY, Disp: 270 capsule, Rfl: 3 .  glucose blood (ACCU-CHEK AVIVA PLUS) test strip, USE TO TEST Glucose ONCE DAILY E11.9, Disp: 100 strip, Rfl: 11 .  halobetasol (ULTRAVATE) 0.05 % cream, APPLY TO AFFECTED AREA TWICE A DAY, Disp: 50 g, Rfl: 2 .  hydrocortisone cream 1 %, Apply 1 application topically daily as needed for itching., Disp: , Rfl:  .  loratadine (CLARITIN) 10 MG tablet, Take 10 mg by mouth daily as needed for allergies. , Disp: , Rfl:  .  LORazepam (ATIVAN) 0.5 MG tablet, Take 1 tablet (0.5 mg  total) by mouth every 8 (eight) hours as needed for anxiety., Disp: 15 tablet, Rfl: 0 .  metFORMIN (GLUCOPHAGE) 1000 MG tablet, TAKE 1 TABLET BY MOUTH EVERY DAY WITH BREAKFAST, Disp: 180 tablet, Rfl: 0 .  Multiple Vitamin (MULTIVITAMIN) tablet, Take 1 tablet by mouth daily., Disp: , Rfl:  .  nystatin cream (MYCOSTATIN), APPLY TO AFFECTED AREA TWICE A DAY, Disp: 30 g, Rfl: 2 .  pantoprazole (PROTONIX) 40 MG tablet, TAKE 1 TABLET BY MOUTH EVERY DAY, Disp: 30 tablet, Rfl: 1 .  potassium chloride SA (KLOR-CON M20) 20 MEQ tablet, TAKE 2 TABLETS BY MOUTH IN THE MORNING, Disp: 180 tablet, Rfl: 3 .  sildenafil (VIAGRA) 100 MG tablet, TAKE 1 TABLET DAILY AS NEEDED ED, Disp: 4 tablet, Rfl: 22 .  solifenacin (VESICARE) 10 MG tablet, Take 10 mg by mouth daily., Disp: , Rfl:  .  tadalafil (CIALIS) 10 MG tablet, Take 0.5-1 tablets (5-10 mg total) by mouth daily as needed for erectile dysfunction., Disp: 30 tablet, Rfl: 5 .  tamsulosin (FLOMAX) 0.4 MG CAPS capsule, TAKE 1 CAPSULE BY MOUTH EVERY DAY, Disp: 90 capsule, Rfl: 2 .  Tetrahydrozoline HCl (VISINE OP), Place 1 drop into both eyes daily as needed (dry eyes)., Disp: , Rfl:  .  traMADol (ULTRAM) 50 MG tablet, Take 1 tablet (50 mg total) by mouth every 6 (six) hours as needed for moderate pain., Disp: 60 tablet, Rfl: 0  Allergies  Allergen Reactions  . Entresto [Sacubitril-Valsartan]     Intolerant due to frequent urination  . Lisinopril Cough         Objective:  Physical Exam  General: AAO x3, NAD  Dermatological: Nails are hypertrophic, dystrophic, brittle, discolored, elongated 5. No surrounding redness or drainage. Tenderness nails 1-5 on the left.  On the left second toe there is a superficial laceration adjacent to the toenail he cut himself.  Minimal edema.  No drainage or pus or ascending cellulitis.  No fluctuation crepitation.  No open lesions or pre-ulcerative lesions are identified today.  Vascular: DP pulses 2/4, PT pulse 1/4.  Pedal  hair growth present. No varicosities and no lower extremity edema present. There is no pain with calf compression, swelling, warmth, erythema.   Neruologic: Sensation decreased with Thornell Mule monofilament  Musculoskeletal: Hammertoes are present.  Muscular strength 5/5 in all groups tested.     Assessment:   80 year old male with symptomatic onychomycosis, laceration left second toe; Right BKA     Plan:  -Treatment options discussed including all alternatives, risks, and complications -Etiology of symptoms were discussed -Debrided the nails x5 without any complications or bleeding. -Given the superficial laceration on  the left second toe from where he cut himself try to trim his nails I prescribed mupirocin ointment.  Monitor closely for any signs or symptoms of infection. -Follow-up with our pedorthotist, Rick, for diabetic shoes in about 2 weeks and I will also see him back to check the left 2nd toe.  -Daily foot inspection  Return in about 2 weeks (around 07/26/2020).  Trula Slade DPM

## 2020-07-21 ENCOUNTER — Telehealth: Payer: Self-pay

## 2020-07-21 NOTE — Chronic Care Management (AMB) (Signed)
Chronic Care Management Pharmacy Assistant   Name: JATAVIS MALEK  MRN: [id number] DOB: 05/16/1941  Reason for Encounter: Chart Review  PCP : Vivi Barrack, MD  Allergies:   Allergies  Allergen Reactions  . Entresto [Sacubitril-Valsartan]     Intolerant due to frequent urination  . Lisinopril Cough    Medications: Outpatient Encounter Medications as of 07/21/2020  Medication Sig  . Accu-Chek FastClix Lancets MISC USE TO CHECK BLOOD SUGAR DAILY AND PRN  . aspirin 325 MG tablet Take 325 mg by mouth daily.  Marland Kitchen aspirin-sod bicarb-citric acid (ALKA-SELTZER) 325 MG TBEF tablet Take 650 mg by mouth daily as needed (heart burn).  . carvedilol (COREG) 25 MG tablet TAKE 1 TABLET BY MOUTH TWICE A DAY  . doxycycline (VIBRA-TABS) 100 MG tablet Take 1 tablet (100 mg total) by mouth 2 (two) times daily.  Marland Kitchen ENTRESTO 49-51 MG TAKE 1 TABLET BY MOUTH TWICE A DAY  . furosemide (LASIX) 20 MG tablet Take 1 tablet (20 mg total) by mouth daily as needed for edema.  . gabapentin (NEURONTIN) 100 MG capsule TAKE 1 CAPSULE BY MOUTH THREE TIMES A DAY  . glucose blood (ACCU-CHEK AVIVA PLUS) test strip USE TO TEST Glucose ONCE DAILY E11.9  . halobetasol (ULTRAVATE) 0.05 % cream APPLY TO AFFECTED AREA TWICE A DAY  . hydrocortisone cream 1 % Apply 1 application topically daily as needed for itching.  . loratadine (CLARITIN) 10 MG tablet Take 10 mg by mouth daily as needed for allergies.   Marland Kitchen LORazepam (ATIVAN) 0.5 MG tablet Take 1 tablet (0.5 mg total) by mouth every 8 (eight) hours as needed for anxiety.  . metFORMIN (GLUCOPHAGE) 1000 MG tablet TAKE 1 TABLET BY MOUTH EVERY DAY WITH BREAKFAST  . Multiple Vitamin (MULTIVITAMIN) tablet Take 1 tablet by mouth daily.  . mupirocin ointment (BACTROBAN) 2 % Apply 1 application topically 2 (two) times daily.  Marland Kitchen nystatin cream (MYCOSTATIN) APPLY TO AFFECTED AREA TWICE A DAY  . pantoprazole (PROTONIX) 40 MG tablet TAKE 1 TABLET BY MOUTH EVERY DAY  . potassium chloride  SA (KLOR-CON M20) 20 MEQ tablet TAKE 2 TABLETS BY MOUTH IN THE MORNING  . sildenafil (VIAGRA) 100 MG tablet TAKE 1 TABLET DAILY AS NEEDED ED  . solifenacin (VESICARE) 10 MG tablet Take 10 mg by mouth daily.  . tadalafil (CIALIS) 10 MG tablet Take 0.5-1 tablets (5-10 mg total) by mouth daily as needed for erectile dysfunction.  . tamsulosin (FLOMAX) 0.4 MG CAPS capsule TAKE 1 CAPSULE BY MOUTH EVERY DAY  . Tetrahydrozoline HCl (VISINE OP) Place 1 drop into both eyes daily as needed (dry eyes).  . traMADol (ULTRAM) 50 MG tablet Take 1 tablet (50 mg total) by mouth every 6 (six) hours as needed for moderate pain.   No facility-administered encounter medications on file as of 07/21/2020.    Current Diagnosis: Patient Active Problem List   Diagnosis Date Noted  . Gastroesophageal reflux disease 12/08/2019  . Dermatitis 03/10/2018  . Benign prostatic hyperplasia with nocturia 11/12/2017  . Nonischemic cardiomyopathy ([place]) 09/03/2017  . AVNRT (AV nodal re-entry tachycardia) ([place]) 01/30/2017  . Chronic midline low back pain without sciatica 01/03/2017  . H/O amputation of leg through tibia and fibula, right ([place]) 05/03/2016  . Phantom limb pain ([place]) 03/06/2016  . SVT (supraventricular tachycardia) ([place]) 01/20/2016  . Chronic systolic heart failure ([place]) 01/20/2016  . Esophageal dysphagia 08/06/2014  . Anxiety state 10/26/2013  . Hyperglycemia 11/02/2010  . BASAL CELL CARCINOMA, FACE 10/18/2008  .  LIPOMA, SKIN 05/31/2008  . ERECTILE DYSFUNCTION 09/30/2007  . OSTEOARTHRITIS 08/08/2007  . Essential hypertension 03/24/2007    Reviewed chart for medication changes.  07/07/2020 OV PCP Dr. Jerline Pain; Cellulitis, given Rx for Doxycycline, CHF lower extremity edema, reduce salt intake, compression and elevation Rx for Furosemide 20 mg daily prn for edema, f/u with cardiology soon, refer to podiatry for Onychomycosis  07/12/2020 OV Podiatry Dr. Jacqualyn Posey; debrided nails x10, mupirocin ointment to cut  on left second toe from patient try to trim his own nails, f/u with pedorthotist for diabetic shoes  Future Appointments  Date Time Provider [place]  07/25/2020 10:15 AM Velora Heckler [facility]  07/26/2020 10:45 AM Trula Slade, DPM [facility]  08/09/2020  1:00 PM Gregor Hams, MD [facility] None  02/21/2021  1:30 PM [facility] PHARMACIST [facility]     April D Calhoun, [place] Pharmacist Assistant [phone]   Follow-Up:  Pharmacist Review

## 2020-07-25 ENCOUNTER — Other Ambulatory Visit: Payer: Self-pay

## 2020-07-25 ENCOUNTER — Ambulatory Visit (INDEPENDENT_AMBULATORY_CARE_PROVIDER_SITE_OTHER): Payer: Medicare Other | Admitting: Podiatrist

## 2020-07-25 ENCOUNTER — Encounter: Payer: Self-pay | Admitting: Podiatrist

## 2020-07-25 DIAGNOSIS — E1149 Type 2 diabetes mellitus with other diabetic neurological complication: Secondary | ICD-10-CM

## 2020-07-25 NOTE — Progress Notes (Signed)
Patient presented to be measured for diabetic shoes and inserts.  Patient declined the diabetic shoes stating he already has a pair of SAS shoes he is happy with in both brown and black.  He will contact the office if he reconsiders in the future.

## 2020-07-26 ENCOUNTER — Encounter: Payer: Self-pay | Admitting: Podiatry

## 2020-07-26 ENCOUNTER — Ambulatory Visit (INDEPENDENT_AMBULATORY_CARE_PROVIDER_SITE_OTHER): Payer: Medicare Other | Admitting: Podiatry

## 2020-07-26 DIAGNOSIS — E1149 Type 2 diabetes mellitus with other diabetic neurological complication: Secondary | ICD-10-CM

## 2020-07-26 DIAGNOSIS — Z89511 Acquired absence of right leg below knee: Secondary | ICD-10-CM | POA: Diagnosis not present

## 2020-07-26 DIAGNOSIS — L84 Corns and callosities: Secondary | ICD-10-CM | POA: Diagnosis not present

## 2020-07-26 DIAGNOSIS — S91116A Laceration without foreign body of unspecified lesser toe(s) without damage to nail, initial encounter: Secondary | ICD-10-CM

## 2020-07-29 ENCOUNTER — Other Ambulatory Visit: Payer: Self-pay | Admitting: Internal Medicine

## 2020-07-29 NOTE — Progress Notes (Signed)
Subjective: 80 year old male presents the office today for evaluation of a wound to the left second toe from he try to cut the nails himself when he accidentally cut the skin.  States that the area is healed well.  We did try to get him new diabetic inserts but he came in yesterday did not want to get new ones.  He brought in shoes for me to look at.  He has had a chronic callus along his foot more the foot collapse she reports.  He has a history of a wound to this area of infection but it healed.  Denies any systemic complaints such as fevers, chills, nausea, vomiting. No acute changes since last appointment, and no other complaints at this time.   Objective: AAO x3, NAD DP/PT pulses palpable bilaterally, CRT less than 3 seconds On the plantar aspect of the left foot along the talar prominences a hyperkeratotic lesion.  There is no underlying ulceration drainage or any signs of infection today.  On the second toe there is a laceration has resolved.  There is no edema, erythema and signs of infection.  No open lesions identified today. No pain with calf compression, swelling, warmth, erythema  Assessment: Healed ulcer left foot second digit; preulcerative callus  Plan: -All treatment options discussed with the patient including all alternatives, risks, complications.  -Wound is healed.  Advised not to try to trim the nails himself.  Debrided the callus but and complications of bleeding.  On the right side continue moisturizer for the callus.  I added offloading pad to his diabetic insert to help further offload this area.  Discussed daily foot inspection and there is any irritation or issues to let me know immediately. -Patient encouraged to call the office with any questions, concerns, change in symptoms.   Trula Slade DPM

## 2020-08-05 NOTE — Progress Notes (Signed)
   I, [name], LAT, ATC, am serving as scribe for Dr. Lynne Leader.  [name] is a 80 y.o. male who presents to [place] at [facility] today for f/u R elbow pain due to triceps tendonopathy ongoing since early January. Of note, pt has a R below the knee amputation in 2018. In the evening, he does not wear his prosthesis and uses a wheelchair, using his arms to push-up out of the chair for transitions. Pt was last seen by Dr. Georgina Snell on 06/21/20 and was advised to use Voltaren gel and was referred to PT at [facility] of which he's completed no visits. Today, pt reports that his R elbow is feeling better.  He has not gone to PT nor has he used any Voltaren gel.  He states that it will intermittently bother him when he has to push up out of a chair but otherwise is doing ok.  Dx imaging: 06/21/20 R elbow XR  Pertinent review of systems: No fevers or chills  Relevant historical information: Right lower leg prosthesis   Exam:  BP 112/78 (BP Location: Left Arm, Patient Position: Sitting, Cuff Size: Normal)   Pulse 75   Ht 6' (1.829 m)   Wt 224 lb 12.8 oz (102 kg)   SpO2 96%   BMI 30.49 kg/m  General: Well Developed, well nourished, and in no acute distress.   MSK: Right elbow normal-appearing nontender normal motion normal strength.  No significant pain with resisted elbow extension.   Assessment and Plan: 80 y.o. male with right distal triceps tendinitis improved with a bit of time and a little bit of home exercise program.  Watchful waiting recheck as needed.  Okay to use Voltaren gel intermittently.   PDMP not reviewed this encounter. No orders of the defined types were placed in this encounter.  No orders of the defined types were placed in this encounter.    Discussed warning signs or symptoms. Please see discharge instructions. Patient expresses understanding.   The above documentation has been reviewed and is accurate and complete Lynne Leader, M.D.

## 2020-08-07 ENCOUNTER — Other Ambulatory Visit: Payer: Self-pay | Admitting: Family Medicine

## 2020-08-09 ENCOUNTER — Encounter: Payer: Self-pay | Admitting: Family Medicine

## 2020-08-09 ENCOUNTER — Ambulatory Visit (INDEPENDENT_AMBULATORY_CARE_PROVIDER_SITE_OTHER): Payer: Medicare Other | Admitting: Family Medicine

## 2020-08-09 ENCOUNTER — Other Ambulatory Visit: Payer: Self-pay

## 2020-08-09 VITALS — BP 112/78 | HR 75 | Ht 72.0 in | Wt 224.8 lb

## 2020-08-09 DIAGNOSIS — M25521 Pain in right elbow: Secondary | ICD-10-CM | POA: Diagnosis not present

## 2020-08-09 NOTE — Patient Instructions (Signed)
Thank you for coming in today.  Keep an eye on it.   Please use voltaren gel up to 4x daily for pain as needed.   Recheck with me as needed for this other issues like it.

## 2020-08-10 ENCOUNTER — Other Ambulatory Visit: Payer: Self-pay | Admitting: Family Medicine

## 2020-08-13 ENCOUNTER — Other Ambulatory Visit: Payer: Self-pay | Admitting: Family Medicine

## 2020-08-18 DIAGNOSIS — H26491 Other secondary cataract, right eye: Secondary | ICD-10-CM | POA: Diagnosis not present

## 2020-08-18 DIAGNOSIS — Z961 Presence of intraocular lens: Secondary | ICD-10-CM | POA: Diagnosis not present

## 2020-08-18 DIAGNOSIS — H52203 Unspecified astigmatism, bilateral: Secondary | ICD-10-CM | POA: Diagnosis not present

## 2020-08-18 DIAGNOSIS — E119 Type 2 diabetes mellitus without complications: Secondary | ICD-10-CM | POA: Diagnosis not present

## 2020-08-18 LAB — HM DIABETES EYE EXAM

## 2020-08-19 ENCOUNTER — Encounter: Payer: Self-pay | Admitting: Family Medicine

## 2020-08-27 ENCOUNTER — Other Ambulatory Visit: Payer: Self-pay | Admitting: Internal Medicine

## 2020-09-08 ENCOUNTER — Other Ambulatory Visit: Payer: Self-pay | Admitting: Internal Medicine

## 2020-09-08 ENCOUNTER — Other Ambulatory Visit: Payer: Self-pay | Admitting: Family Medicine

## 2020-09-12 ENCOUNTER — Other Ambulatory Visit: Payer: Self-pay | Admitting: Family Medicine

## 2020-09-20 ENCOUNTER — Other Ambulatory Visit: Payer: Self-pay | Admitting: Internal Medicine

## 2020-09-21 ENCOUNTER — Telehealth: Payer: Self-pay

## 2020-09-21 NOTE — Telephone Encounter (Signed)
Pt called about his medication. Pt states he is confused and needs to talk with CMA. Please advise.

## 2020-09-21 NOTE — Telephone Encounter (Signed)
Patient stated he call Dr.Hilty ov no Rx needed

## 2020-09-23 ENCOUNTER — Telehealth: Payer: Self-pay | Admitting: Internal Medicine

## 2020-09-23 NOTE — Telephone Encounter (Signed)
New Message:    Pt says he needs to talk to the nurse about his Carvedilol. He said it was denied and hewants to know why it was  denied.

## 2020-09-23 NOTE — Telephone Encounter (Signed)
Returned call to patient-he was wondering why his prescription got denied (coreg).  Advised it was filled on 4/14 for 30 days and the most recent was denied due to this already being completed, too soon to fill. He has medication but voiced frustration with the pharmacy continuing to tell him this.    Advised he is due for OV and this needs to be scheduled.   He request to have someone call him later to schedule appt.    Message sent to scheduling team.

## 2020-10-05 ENCOUNTER — Other Ambulatory Visit: Payer: Self-pay | Admitting: Family Medicine

## 2020-10-11 ENCOUNTER — Ambulatory Visit (INDEPENDENT_AMBULATORY_CARE_PROVIDER_SITE_OTHER): Payer: Medicare Other | Admitting: Internal Medicine

## 2020-10-11 ENCOUNTER — Other Ambulatory Visit: Payer: Self-pay

## 2020-10-11 ENCOUNTER — Encounter: Payer: Self-pay | Admitting: Internal Medicine

## 2020-10-11 VITALS — BP 138/72 | HR 58 | Ht 72.0 in | Wt 227.4 lb

## 2020-10-11 DIAGNOSIS — I10 Essential (primary) hypertension: Secondary | ICD-10-CM

## 2020-10-11 DIAGNOSIS — I493 Ventricular premature depolarization: Secondary | ICD-10-CM

## 2020-10-11 DIAGNOSIS — I5022 Chronic systolic (congestive) heart failure: Secondary | ICD-10-CM | POA: Diagnosis not present

## 2020-10-11 DIAGNOSIS — I428 Other cardiomyopathies: Secondary | ICD-10-CM | POA: Diagnosis not present

## 2020-10-11 NOTE — Patient Instructions (Signed)
Medication Instructions:  Your physician recommends that you continue on your current medications as directed. Please refer to the Current Medication list given to you today.  *If you need a refill on your cardiac medications before your next appointment, please call your pharmacy*  Testing/Procedures: Echocardiogram @ [address] Floor   Follow-Up: At [facility], you and your health needs are our priority.  As part of our continuing mission to provide you with exceptional heart care, we have created designated Provider Care Teams.  These Care Teams include your primary Cardiologist (physician) and Advanced Practice Providers (APPs -  Physician Assistants and Nurse Practitioners) who all work together to provide you with the care you need, when you need it.  We recommend signing up for the patient portal called "MyChart".  Sign up information is provided on this After Visit Summary.  MyChart is used to connect with patients for Virtual Visits (Telemedicine).  Patients are able to view lab/test results, encounter notes, upcoming appointments, etc.  Non-urgent messages can be sent to your provider as well.   To learn more about what you can do with MyChart, go to [email or web address]://[email or web address].com.    Your next appointment:   12 month(s)  The format for your next appointment:   In Person  Provider:   You may see Kenneth C Hilty, MD or one of the following Advanced Practice Providers on your designated Care Team:    Hao Meng, PA-C  Angela Duke, PA-C or   Krista Kroeger, PA-C    Other Instructions   

## 2020-10-11 NOTE — Progress Notes (Signed)
OFFICE NOTE  Chief Complaint:  Follow-up  Primary Care Physician: Vivi Barrack, MD  HPI:  [name] is a 80 y.o. male he was previously seen by Dr. Percival Spanish last in 2013. He was followed for PVCs at that time and underwent stress testing. There is no evidence for ischemia and no further workup was recommended. Recently was hospitalized for sepsis and was noted to have intermittent SVTs as well as PVCs on telemetry. Although he was not seen by cardiology as an inpatient, we were consult it for outpatient monitor. He was placed on a monitor which I reviewed and is still in progress. The first 24 days of the monitor show frequent PVCs as well as intermittent SVT. During this past hospitalization he had an echocardiogram which shows a newly reduced LVEF of 45-50%, with basal inferior akinesis and inferolateral hypokinesis and grade 2 diastolic dysfunction. This compares to a normal LVF and 2013 by echo. He did have a remote left heart catheterization in 2008 which showed minimal coronary artery disease. He denies any chest pain or worsening shortness of breath. He is having trouble ambulating due to problems with his right ankle. He also reports being under significant stress dealing with his wife who has Parkinson's. He is in the process of moving to friends [place].  01/30/2017  [name] returns today for follow-up. He has had a difficult year. His wife was diagnosed with Parkinson's and ultimately died of congestive heart failure. He developed a diabetic wound and then had the transtibial amputation of the right leg. He was supposed going to friends home but ultimately went to [facility] for rehabilitation and is currently back at home. He does have a leg prostheses. He continues to have SVT in fact has a short RP tachycardia which was caught today in the office. He says is asymptomatic with this. EF is shown to be 45-50% in the past. He did undergo nuclear stress test which was negative for  ischemia prior to his amputation. He denies any chest pain.  09/03/2017  [name] returns today for follow-up.  He has no new complaints today.  He has been struggling with some back pain.  He feels like his right leg prosthesis may be a little short.  He denies any recurrent SVT.  He was placed on the carvedilol for cardiomyopathy with EF 45-50% in 2017 and a blood pressures been well controlled on that.  He denies any chest pain.  He reports occasional shortness of breath sometimes with exertion but also at rest.  10/02/2017  [name] was seen today in follow-up.  He continues to get some care from the [place].  He did report some problems with his right leg prosthesis.  He has not been able to exercise since then.  He gets mild shortness of breath with exertion, consistent with NYHA class II symptoms.  Recent echo was performed to see if he had any improvement in LV function, unfortunately however he had a decline in LVEF to 40 to 45%.  There is additionally some mild to moderate RV dysfunction.  He is only on carvedilol.  I think he would benefit from the addition of Entresto.  Labs last year indicated normal renal function.  11/12/2017  [name] returns today for follow-up.  His main concerns are frequent urination both throughout the day and at night.  He was started on tamsulosin by his PCP which he takes 0.4 mg in the morning.  He does not note  any significant improvement.  I had started him on Entresto however due to frequent urination he discontinued the medication.  He was then switched to lisinopril.  He seems to be tolerating this.  LVEF had declined recently to 40 to 45%.  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  05/12/2018  [name] is seen today in follow-up.  He does report some improvement in his urination.  I increase his dose to 0.8 mg tamsulosin however he said he had excessive urination with this and seems to do better  on the 0.4 mg.  His PCP had switched him to Rapaflo, but he said that was also worse.  He denies any worsening shortness of breath or chest pain.  He unfortunately was intolerant of Entresto due to frequent urination.  His EF had lower down to 40 to 45%.  Hopefully this is all related to his infection and now he is status post amputation and recovering.  He will need a repeat assessment of his EF next summer.  He also is reported some cough.  This might be related to lisinopril.  07/03/2018  [name] is seen today in follow-up of left heart cath.  This was performed for declining LVEF, episodes of chest pain at rest, and history of coronary disease.  He underwent left heart catheterization which demonstrated moderate coronary atherosclerosis with 70% stenosis of a small obtuse marginal, 40% stenosis in the first diagonal, 50% mid LAD stenosis, 40% mid RCA and 60% proximal PDA stenosis.  No significant obstructive coronary disease was identified and LVEDP was normal.  Aggressive secondary risk factor modification was recommended.  He returns today and says he is feeling great.  He says he is not sure what all the work-up was for.  I am concerned that because of his frequency of PVCs that at this point he may also have had a PVC mediated cardiomyopathy.  He was ordered to wear a monitor however he declined.  He does not see the value in that.  Therefore we may be left only with medical therapy.  01/21/2019  [name] returns today for follow-up.  Overall he seems to be doing well.  He is actually in a great mood.  He says he is rekindled a relationship with former girlfriend now 2 years after his wife died.  In addition his energy level is good.  He actually purchased exercise equipment and is now been working out in his house.  He denies any worsening shortness of breath.  He did have a repeat echo in June which showed unfortunately no significant change in LV function at 40 to 45%, however is not worsened.  He  feels like he has had excessive urination on Entresto and I am hesitant to increase the dose further.  We did recommend decreasing his tamsulosin back to 0.4 mg daily.  He also asked about Viagra-I would feel that this is safe for him to use however he should monitor for hypotension.  One other option to consider would be daily Cialis in him if he has some issue with BPH as well.  I will defer this to his PCP as he has an upcoming visit in a few days.  07/27/2019  [name] is seen today in follow-up.  He continues to report feeling fairly well.  He like to get back to playing some golf.  His echo shows stable LVEF of 40 to 45%.  He remains on Entresto but notes that because of excessive urination I could not increase his Entresto any further.  Blood pressures well controlled.  Denies any chest pain or worsening shortness of breath.  10/11/2020  [name] returns today for follow-up.  Overall he says he is feeling pretty well.  He seems to be getting around after having a right BKA.  He is trying to play golf again.  He denies any worsening shortness of breath or chest pain.  He is tolerating the Entresto.  His last echo showed moderate reduction in LV function however that was in 2020.  Blood pressure is well controlled.  EKG shows a sinus bradycardia.  PMHx:  Past Medical History:  Diagnosis Date  . CAD (coronary artery disease)    a. Remote cath 2008 for chest pain showed 30% LAD with luminal irregularity and fairly heavy calcification in the mid LAD without critical stenosis, 30% OM1, 30% acute marginal.  . Cancer (HCC)    skin cancer - basal  . Cataract    both  . Chronic combined systolic and diastolic CHF (congestive heart failure) ([place])   . Complication of anesthesia   . Diabetes mellitus    Type II  . ED (erectile dysfunction)   . History of kidney stones    x1  . Hypertension   . NICM (nonischemic cardiomyopathy) ([place])    a. EF 40-45% by echo in 08/2017 - prior low risk stress test in  2017.  . Osteoarthritis   . Paroxysmal SVT (supraventricular tachycardia) ([place])   . PONV (postoperative nausea and vomiting) 1960   with Ether  . PVC's (premature ventricular contractions)   . PVD (peripheral vascular disease) ([place])    s/p R BKA  . Rheumatic fever   . Rhinitis   . S/P BKA (below knee amputation), right [facility])     Past Surgical History:  Procedure Laterality Date  . AMPUTATION Right 02/29/2016   Procedure: Right Leg AMPUTATION BELOW KNEE with Wound Vac placement;  Surgeon: [name], MD;  Location: [place];  Service: Orthopedics;  Laterality: Right;  . CHOLECYSTECTOMY  1983  . colonoscopy  2006, 2010  . EYE SURGERY Bilateral    cataract  . INGUINAL HERNIA REPAIR Right 1960  . LEFT HEART CATH AND CORONARY ANGIOGRAPHY N/A 06/03/2018   Procedure: LEFT HEART CATH AND CORONARY ANGIOGRAPHY;  Surgeon: Belva Crome, MD;  Location: [facility];  Service: Cardiovascular;  Laterality: N/A;  . STUMP REVISION Right 10/30/2017   Procedure: RIGHT BELOW KNEE AMPUTATION REVISION;  Surgeon: [name], MD;  Location: [place];  Service: Orthopedics;  Laterality: Right;    FAMHx:  Family History  Problem Relation Age of Onset  . Stroke Father 36       Died age 14  . Coronary artery disease Brother 8  . Colon cancer Neg Hx   . Colon polyps Neg Hx   . Diabetes Neg Hx   . Esophageal cancer Neg Hx   . Kidney disease Neg Hx   . Gallbladder disease Neg Hx     SOCHx:   reports that he quit smoking about 39 years ago. He quit after 20.00 years of use. He has never used smokeless tobacco. He reports current alcohol use. He reports that he does not use drugs.  ALLERGIES:  Allergies  Allergen Reactions  . Entresto [Sacubitril-Valsartan]     Intolerant due to frequent urination  . Lisinopril Cough    ROS: Pertinent items  noted in HPI and remainder of comprehensive ROS otherwise negative.  HOME MEDS: Current Outpatient Medications  Medication Sig Dispense Refill  .  ACCU-CHEK AVIVA PLUS test strip USE TO TEST GLUCOSE ONCE DAILY E11.9 100 strip 11  . Accu-Chek FastClix Lancets MISC USE TO CHECK BLOOD SUGAR DAILY AND PRN 102 each 4  . aspirin 325 MG tablet Take 325 mg by mouth daily.    Marland Kitchen aspirin-sod bicarb-citric acid (ALKA-SELTZER) 325 MG TBEF tablet Take 650 mg by mouth daily as needed (heart burn).    . carvedilol (COREG) 25 MG tablet TAKE 1 TABLET BY MOUTH TWICE A DAY 30 tablet 0  . ENTRESTO 49-51 MG TAKE 1 TABLET BY MOUTH TWICE A DAY 60 tablet 5  . furosemide (LASIX) 20 MG tablet Take 1 tablet (20 mg total) by mouth daily as needed for edema. 30 tablet 3  . gabapentin (NEURONTIN) 100 MG capsule TAKE 1 CAPSULE BY MOUTH THREE TIMES A DAY 270 capsule 3  . halobetasol (ULTRAVATE) 0.05 % cream APPLY TO AFFECTED AREA TWICE A DAY 50 g 2  . HYDROcodone-acetaminophen (NORCO/VICODIN) 5-325 MG tablet Take 1 tablet by mouth every 6 (six) hours.    . hydrocortisone cream 1 % Apply 1 application topically daily as needed for itching.    . loratadine (CLARITIN) 10 MG tablet Take 10 mg by mouth daily as needed for allergies.     Marland Kitchen LORazepam (ATIVAN) 0.5 MG tablet Take 1 tablet (0.5 mg total) by mouth every 8 (eight) hours as needed for anxiety. 15 tablet 0  . metFORMIN (GLUCOPHAGE) 1000 MG tablet TAKE 1 TABLET BY MOUTH EVERY DAY WITH BREAKFAST 90 tablet 1  . methocarbamol (ROBAXIN) 500 MG tablet Take 1 tablet by mouth 4 (four) times daily.    . Multiple Vitamin (MULTIVITAMIN) tablet Take 1 tablet by mouth daily.    . mupirocin ointment (BACTROBAN) 2 % Apply 1 application topically 2 (two) times daily. 30 g 2  . nystatin cream (MYCOSTATIN) APPLY TO AFFECTED AREA TWICE A DAY 30 g 2  . pantoprazole (PROTONIX) 40 MG tablet TAKE 1 TABLET BY MOUTH EVERY DAY 30 tablet 1  . potassium chloride (KLOR-CON) 10 MEQ tablet Take 2 tablets by mouth daily.    . potassium chloride SA (KLOR-CON M20) 20 MEQ tablet TAKE 2 TABLETS BY MOUTH EVERY MORNING 180 tablet 3  . sildenafil (VIAGRA)  100 MG tablet TAKE 1 TABLET DAILY AS NEEDED ED 4 tablet 22  . solifenacin (VESICARE) 10 MG tablet Take 10 mg by mouth daily.    . tadalafil (CIALIS) 10 MG tablet Take 0.5-1 tablets (5-10 mg total) by mouth daily as needed for erectile dysfunction. 30 tablet 5  . tamsulosin (FLOMAX) 0.4 MG CAPS capsule TAKE 1 CAPSULE BY MOUTH EVERY DAY 90 capsule 2  . Tetrahydrozoline HCl (VISINE OP) Place 1 drop into both eyes daily as needed (dry eyes).    . traMADol (ULTRAM) 50 MG tablet Take 1 tablet (50 mg total) by mouth every 6 (six) hours as needed for moderate pain. 60 tablet 0   No current facility-administered medications for this visit.    LABS/IMAGING: No results found for this or any previous visit (from the past 48 hour(s)). No results found.  WEIGHTS: Wt Readings from Last 3 Encounters:  10/11/20 227 lb 6.4 oz (103.1 kg)  08/09/20 224 lb 12.8 oz (102 kg)  07/07/20 218 lb (98.9 kg)    VITALS: BP 138/72 (BP Location: Left Arm, Patient Position: Sitting, Cuff Size: Normal)  Pulse (!) 58   Ht 6' (1.829 m)   Wt 227 lb 6.4 oz (103.1 kg)   BMI 30.84 kg/m   EXAM: General appearance: alert, no distress and mildly obese Lungs: clear to auscultation bilaterally Heart: regular rate and rhythm, S1, S2 normal, no murmur, click, rub or gallop Abdomen: soft, non-tender; bowel sounds normal; no masses,  no organomegaly Extremities: Trace left lower extremity edema, status post right BKA Psych: Mildly anxious  EKG: Sinus bradycardia at 58, nonspecific ST changes-personally reviewed  ASSESSMENT: 1. Non-ischemic cardiomyopathy with EF 40-45% (08/2017), NYHA Class II symptoms 2. Recurrent short RP tachycardia  3. Hypertension 4. Type 2 diabetes 5. Right BKA  PLAN: 1.   [name] has chest pain or worsening shortness of breath.  He has some difficulty ambulating related to his BKA but does not seem to have heart failure symptoms.  His last echo was in 2020.  I like to repeat that.  He was also  noted to have some borderline aortic root enlargement which we will reassess.  I will contact him with those results but otherwise probably continue his current medications since at this point he is on max tolerated dose of Entresto.  We have previously tried to increase it to the full dose.  Follow-up with me annually or sooner as necessary.  Pixie Casino, MD, [facility], [place] Director of the Advanced Lipid Disorders &  Cardiovascular Risk Reduction Clinic Diplomate of the American Board of Clinical Lipidology Attending Cardiologist  Direct Dial: [phone]  Fax: [phone]  Website:  [email or web address] 10/11/2020, 4:12 PM

## 2020-10-12 ENCOUNTER — Other Ambulatory Visit: Payer: Self-pay | Admitting: Family Medicine

## 2020-10-16 ENCOUNTER — Other Ambulatory Visit: Payer: Self-pay | Admitting: Internal Medicine

## 2020-10-17 NOTE — Telephone Encounter (Signed)
Rx(s) sent to pharmacy electronically.  

## 2020-10-28 ENCOUNTER — Telehealth: Payer: Self-pay

## 2020-10-28 NOTE — Chronic Care Management (AMB) (Signed)
Chronic Care Management Pharmacy Assistant   Name: [name]  MRN: [id number] DOB: [birth year]   Reason for Encounter:Hypertension Disease State call    Recent office visits:    Recent consult visits:  10/11/20-[facility] MD (Cardiology)- Office visit for nonischemic cardiomyopathy.  EKG and Echo ordered. DISCONTINUED Doxycycline Hyclate 100mg  1 twice daily. No medication changes, follow up annually.  08/09/20-Evan Georgina Snell MD (Sports Medicine)- Office visit for right elbow pain. Patient reported not using voltaren gel, patient was advised to use it up to 4 times daily. Follow up as needed.  07/26/20-Matthew Wagoner DPM (Podiatry)- Office visit for nail problem. No medication changes.  Follow up as needed.  02//28/22-Kathryn Egerton DPM (Podiatry)-Office visit for nail problem. No medication changes. Followup as needed.   Hospital visits:  None in previous 6 months  Medications: Outpatient Encounter Medications as of 10/28/2020  Medication Sig  . ACCU-CHEK AVIVA PLUS test strip USE TO TEST GLUCOSE ONCE DAILY E11.9  . Accu-Chek FastClix Lancets MISC USE TO CHECK BLOOD SUGAR DAILY AND PRN  . aspirin 325 MG tablet Take 325 mg by mouth daily.  Marland Kitchen aspirin-sod bicarb-citric acid (ALKA-SELTZER) 325 MG TBEF tablet Take 650 mg by mouth daily as needed (heart burn).  . carvedilol (COREG) 25 MG tablet TAKE 1 TABLET BY MOUTH TWICE A DAY  . ENTRESTO 49-51 MG TAKE 1 TABLET BY MOUTH TWICE A DAY  . furosemide (LASIX) 20 MG tablet Take 1 tablet (20 mg total) by mouth daily as needed for edema.  . gabapentin (NEURONTIN) 100 MG capsule TAKE 1 CAPSULE BY MOUTH THREE TIMES A DAY  . halobetasol (ULTRAVATE) 0.05 % cream APPLY TO AFFECTED AREA TWICE A DAY  . HYDROcodone-acetaminophen (NORCO/VICODIN) 5-325 MG tablet Take 1 tablet by mouth every 6 (six) hours.  . hydrocortisone cream 1 % Apply 1 application topically daily as needed for itching.  . loratadine (CLARITIN) 10 MG tablet Take 10 mg by mouth  daily as needed for allergies.   Marland Kitchen LORazepam (ATIVAN) 0.5 MG tablet Take 1 tablet (0.5 mg total) by mouth every 8 (eight) hours as needed for anxiety.  . metFORMIN (GLUCOPHAGE) 1000 MG tablet TAKE 1 TABLET BY MOUTH EVERY DAY WITH BREAKFAST  . methocarbamol (ROBAXIN) 500 MG tablet Take 1 tablet by mouth 4 (four) times daily.  . Multiple Vitamin (MULTIVITAMIN) tablet Take 1 tablet by mouth daily.  . mupirocin ointment (BACTROBAN) 2 % Apply 1 application topically 2 (two) times daily.  Marland Kitchen nystatin cream (MYCOSTATIN) APPLY TO AFFECTED AREA TWICE A DAY  . pantoprazole (PROTONIX) 40 MG tablet TAKE 1 TABLET BY MOUTH EVERY DAY  . potassium chloride (KLOR-CON) 10 MEQ tablet Take 2 tablets by mouth daily.  . potassium chloride SA (KLOR-CON M20) 20 MEQ tablet TAKE 2 TABLETS BY MOUTH EVERY MORNING  . sildenafil (VIAGRA) 100 MG tablet TAKE 1 TABLET DAILY AS NEEDED ED  . solifenacin (VESICARE) 10 MG tablet Take 10 mg by mouth daily.  . tadalafil (CIALIS) 10 MG tablet Take 0.5-1 tablets (5-10 mg total) by mouth daily as needed for erectile dysfunction.  . tamsulosin (FLOMAX) 0.4 MG CAPS capsule TAKE 1 CAPSULE BY MOUTH EVERY DAY  . Tetrahydrozoline HCl (VISINE OP) Place 1 drop into both eyes daily as needed (dry eyes).  . traMADol (ULTRAM) 50 MG tablet Take 1 tablet (50 mg total) by mouth every 6 (six) hours as needed for moderate pain.   No facility-administered encounter medications on file as of 10/28/2020.   Reviewed chart prior  to disease state call. Spoke with patient regarding BP  Recent Office Vitals: BP Readings from Last 3 Encounters:  10/11/20 138/72  08/09/20 112/78  07/07/20 119/66   Pulse Readings from Last 3 Encounters:  10/11/20 (!) 58  08/09/20 75  07/07/20 (!) 55    Wt Readings from Last 3 Encounters:  10/11/20 227 lb 6.4 oz (103.1 kg)  08/09/20 224 lb 12.8 oz (102 kg)  07/07/20 218 lb (98.9 kg)     Kidney Function Lab Results  Component Value Date/Time   CREATININE 0.86  07/07/2020 10:02 AM   CREATININE 0.68 09/04/2019 09:18 AM   CREATININE 0.79 01/23/2016 12:03 PM   GFR 81.66 07/07/2020 10:02 AM   GFRNONAA 86 07/10/2018 10:39 AM   GFRAA 99 07/10/2018 10:39 AM    BMP Latest Ref Rng & Units 07/07/2020 09/04/2019 07/10/2018  Glucose 70 - 99 mg/dL 106(H) 100(H) 164(H)  BUN 6 - 23 mg/dL 17 17 15   Creatinine 0.40 - 1.50 mg/dL 0.86 0.68 0.79  BUN/Creat Ratio 10 - 24 - - 19  Sodium 135 - 145 mEq/L 143 140 140  Potassium 3.5 - 5.1 mEq/L 4.3 4.6 4.5  Chloride 96 - 112 mEq/L 108 103 101  CO2 19 - 32 mEq/L 27 28 22   Calcium 8.4 - 10.5 mg/dL 9.2 9.3 9.1   Patient reports he is doing well.  Continuing on Coreg and Entresto.  Denies problems obtaining his medications from the pharmacy.    . Current antihypertensive regimen:  o Carvedilol 25mg  take 1 tablet twice daily o Entresto 49-51mg  take 1 tablet twice daily . How often are you checking your Blood Pressure? daily . Current home BP readings: 10/28/20  133/67, 10/27/20 123/76, 10/26/20  113/70 . What recent interventions/DTPs have been made by any provider to improve Blood Pressure control since last CPP Visit:  None . Any recent hospitalizations or ED visits since last visit with CPP? No . What diet changes have been made to improve Blood Pressure Control?  o Patient stated his diet is stable, no changes.  . What exercise is being done to improve your Blood Pressure Control?  o Patient stated he uses a bike/elliptical 3 times per week, usually 30 minutes each time.   Adherence Review: Is the patient currently on ACE/ARB medication? No Does the patient have >5 day gap between last estimated fill dates? No  Carvedilol 25mg   Last filled 10/17/20  90DS Entresto 49-51mg   Last filled 10/10/20  30DS  Star Rating Drugs:  Metformin 1000mg   Last filled 09/12/20  90DS  [name]

## 2020-11-06 ENCOUNTER — Other Ambulatory Visit: Payer: Self-pay | Admitting: Internal Medicine

## 2020-11-09 ENCOUNTER — Other Ambulatory Visit: Payer: Self-pay | Admitting: Internal Medicine

## 2020-11-09 DIAGNOSIS — N401 Enlarged prostate with lower urinary tract symptoms: Secondary | ICD-10-CM

## 2020-11-14 ENCOUNTER — Other Ambulatory Visit: Payer: Self-pay

## 2020-11-14 ENCOUNTER — Ambulatory Visit (HOSPITAL_COMMUNITY): Payer: Medicare Other | Attending: Cardiology

## 2020-11-14 DIAGNOSIS — I428 Other cardiomyopathies: Secondary | ICD-10-CM | POA: Diagnosis not present

## 2020-11-14 LAB — ECHOCARDIOGRAM COMPLETE
Area-P 1/2: 2.12 cm2
P 1/2 time: 477 msec
S' Lateral: 3.83 cm

## 2020-11-17 ENCOUNTER — Other Ambulatory Visit: Payer: Self-pay | Admitting: *Deleted

## 2020-11-17 DIAGNOSIS — I5022 Chronic systolic (congestive) heart failure: Secondary | ICD-10-CM

## 2020-11-17 DIAGNOSIS — I428 Other cardiomyopathies: Secondary | ICD-10-CM

## 2020-11-21 ENCOUNTER — Encounter: Payer: Self-pay | Admitting: Family Medicine

## 2020-11-21 ENCOUNTER — Other Ambulatory Visit: Payer: Self-pay

## 2020-11-21 ENCOUNTER — Ambulatory Visit (INDEPENDENT_AMBULATORY_CARE_PROVIDER_SITE_OTHER): Payer: Medicare Other | Admitting: Family Medicine

## 2020-11-21 VITALS — BP 120/74 | HR 66 | Temp 98.0°F | Ht 72.0 in

## 2020-11-21 DIAGNOSIS — L03115 Cellulitis of right lower limb: Secondary | ICD-10-CM | POA: Diagnosis not present

## 2020-11-21 DIAGNOSIS — M7989 Other specified soft tissue disorders: Secondary | ICD-10-CM | POA: Diagnosis not present

## 2020-11-21 DIAGNOSIS — L539 Erythematous condition, unspecified: Secondary | ICD-10-CM | POA: Diagnosis not present

## 2020-11-21 MED ORDER — DOXYCYCLINE HYCLATE 100 MG PO TABS: 100.0000 mg | ORAL_TABLET | Freq: Two times a day (BID) | ORAL | 0 refills | Status: AC

## 2020-11-21 NOTE — Progress Notes (Signed)
Phone [phone] In person visit   Subjective:   [name] is a 80 y.o. year old very pleasant male patient who presents for/with See problem oriented charting Chief Complaint  Patient presents with   Leg Infection    Patient think he has a possible infecting in his R amputated leg. It swollen, Red and has puss coming out of it .    This visit occurred during the SARS-CoV-2 public health emergency.  Safety protocols were in place, including screening questions prior to the visit, additional usage of staff PPE, and extensive cleaning of exam room while observing appropriate contact time as indicated for disinfecting solutions.   Past Medical History-  Patient Active Problem List   Diagnosis Date Noted   Gastroesophageal reflux disease 12/08/2019   Dermatitis 03/10/2018   Benign prostatic hyperplasia with nocturia 11/12/2017   Nonischemic cardiomyopathy ([place]) 09/03/2017   AVNRT (AV nodal re-entry tachycardia) ([place]) 01/30/2017   Chronic midline low back pain without sciatica 01/03/2017   H/O amputation of leg through tibia and fibula, right ([place]) 05/03/2016   Phantom limb pain ([place]) 03/06/2016   SVT (supraventricular tachycardia) ([place]) 93/73/4287   Chronic systolic heart failure ([place]) 01/20/2016   Diabetic peripheral neuropathy ([place]) 08/23/2015   Charcot foot due to diabetes mellitus ([place]) 07/28/2015   Esophageal dysphagia 08/06/2014   Anxiety state 10/26/2013   Hyperglycemia 11/02/2010   BASAL CELL CARCINOMA, FACE 10/18/2008   LIPOMA, SKIN 05/31/2008   ERECTILE DYSFUNCTION 09/30/2007   OSTEOARTHRITIS 08/08/2007   Essential hypertension 03/24/2007    Medications- reviewed and updated Current Outpatient Medications  Medication Sig Dispense Refill   ACCU-CHEK AVIVA PLUS test strip USE TO TEST GLUCOSE ONCE DAILY E11.9 100 strip 11   Accu-Chek FastClix Lancets MISC USE TO CHECK BLOOD SUGAR DAILY AND PRN 102 each 4   aspirin 325 MG tablet Take 325 mg by mouth daily.      aspirin-sod bicarb-citric acid (ALKA-SELTZER) 325 MG TBEF tablet Take 650 mg by mouth daily as needed (heart burn).     carvedilol (COREG) 25 MG tablet TAKE 1 TABLET BY MOUTH TWICE A DAY 90 tablet 3   doxycycline (VIBRA-TABS) 100 MG tablet Take 1 tablet (100 mg total) by mouth 2 (two) times daily for 7 days. 14 tablet 0   furosemide (LASIX) 20 MG tablet Take 1 tablet (20 mg total) by mouth daily as needed for edema. 30 tablet 3   gabapentin (NEURONTIN) 100 MG capsule TAKE 1 CAPSULE BY MOUTH THREE TIMES A DAY 270 capsule 3   halobetasol (ULTRAVATE) 0.05 % cream APPLY TO AFFECTED AREA TWICE A DAY 50 g 2   HYDROcodone-acetaminophen (NORCO/VICODIN) 5-325 MG tablet Take 1 tablet by mouth every 6 (six) hours.     hydrocortisone cream 1 % Apply 1 application topically daily as needed for itching.     loratadine (CLARITIN) 10 MG tablet Take 10 mg by mouth daily as needed for allergies.      LORazepam (ATIVAN) 0.5 MG tablet Take 1 tablet (0.5 mg total) by mouth every 8 (eight) hours as needed for anxiety. 15 tablet 0   metFORMIN (GLUCOPHAGE) 1000 MG tablet TAKE 1 TABLET BY MOUTH EVERY DAY WITH BREAKFAST 90 tablet 1   methocarbamol (ROBAXIN) 500 MG tablet Take 1 tablet by mouth 4 (four) times daily.     Multiple Vitamin (MULTIVITAMIN) tablet Take 1 tablet by mouth daily.     mupirocin ointment (BACTROBAN) 2 % Apply 1 application topically 2 (two) times daily. 30 g 2  nystatin cream (MYCOSTATIN) APPLY TO AFFECTED AREA TWICE A DAY 30 g 2   pantoprazole (PROTONIX) 40 MG tablet TAKE 1 TABLET BY MOUTH EVERY DAY 90 tablet 1   potassium chloride (KLOR-CON) 10 MEQ tablet Take 2 tablets by mouth daily.     potassium chloride SA (KLOR-CON M20) 20 MEQ tablet TAKE 2 TABLETS BY MOUTH EVERY MORNING 180 tablet 3   sacubitril-valsartan (ENTRESTO) 49-51 MG TAKE 1 TABLET BY MOUTH TWICE A DAY 180 tablet 3   sildenafil (VIAGRA) 100 MG tablet TAKE 1 TABLET DAILY AS NEEDED ED 4 tablet 22   solifenacin (VESICARE) 10 MG tablet  Take 10 mg by mouth daily.     tadalafil (CIALIS) 10 MG tablet Take 0.5-1 tablets (5-10 mg total) by mouth daily as needed for erectile dysfunction. 30 tablet 5   tamsulosin (FLOMAX) 0.4 MG CAPS capsule TAKE 1 CAPSULE BY MOUTH EVERY DAY 90 capsule 3   Tetrahydrozoline HCl (VISINE OP) Place 1 drop into both eyes daily as needed (dry eyes).     traMADol (ULTRAM) 50 MG tablet Take 1 tablet (50 mg total) by mouth every 6 (six) hours as needed for moderate pain. 60 tablet 0   No current facility-administered medications for this visit.     Objective:  BP 120/74   Pulse 66   Temp 98 F (36.7 C) (Temporal)   Ht 6' (1.829 m)   SpO2 98%   BMI 30.84 kg/m  Gen: NAD, resting comfortably CV: RRR no murmurs rubs or gallops Lungs: CTAB no crackles, wheeze, rhonchi  Ext: 1+ edema on left leg, on right leg patient edematous - states previously had pain over tibia now resolved. Patient states has had long term nonhealing callous on the leg - around this are gets serosanguinous drainage (noted on floor in picture after evaluation) no obvious purulence. No pain today. Some warmth Skin: warm, dry       Assessment and Plan   # Right leg cellulitis at site of prior BKA S:patient reports swelling in right leg (prior amputation/BKA) along with erythema, warmth and has even noted purulent material. History of cellulitis . This started 3-4 days ago. Symptoms were worsening then he started some doxyxycline he had left over around Friday and symptoms began to improve. Redness and swelling and pain have gone down some since that time- in particular pain and redness- swelling lingers the most  Last day he worse prosthesis was Friday  Cbgs lately up from 100-110 to about 126 this AM. Listed as hyperglycemia by Dr. Jerline Pain- patient remains on metformin. A1c has been excellent Lab Results  Component Value Date   HGBA1C 5.4 03/11/2020   No fever chills body aches or feeling poorly overall  Lab Results   Component Value Date   HGBA1C 5.4 03/11/2020  A/P: Patient with what appears to be cellulitis of the right lower extremity at site of prior BKA.  He has noted improvement with doxycycline for several days-we are going to extend this course another 7 days.  The bigger concern here is to rule out osteomyelitis/bone infection-we will get ESR/CRP and CBC labs as well as x-ray-he will have to go to [facility] for this.  If he has new or worsening symptoms he should let us know immediately.  Fortunately patient has not felt ill overall or had fevers at this point-if those develop he will let us know.  Recommended follow up: Start doxycycline and continued for 7 days.  Schedule follow-up with Dr. Jerline Pain or me within  the next week for recheck. New or worsening symptoms let us know ASAP Future Appointments  Date Time Provider [place]  02/21/2021  1:30 PM [facility] CCM PHARMACIST [facility]    Lab/Order associations:   ICD-10-CM   1. Cellulitis of right leg  L03.115     2. Right leg swelling  M79.89 DG Tibia/Fibula Right    CBC with Differential/Platelet    Comprehensive metabolic panel    Sedimentation rate    C-reactive protein    3. Leg erythema  L53.9 DG Tibia/Fibula Right    CBC with Differential/Platelet    Comprehensive metabolic panel    Sedimentation rate    C-reactive protein      Meds ordered this encounter  Medications   doxycycline (VIBRA-TABS) 100 MG tablet    Sig: Take 1 tablet (100 mg total) by mouth 2 (two) times daily for 7 days.    Dispense:  14 tablet    Refill:  0   Time Spent: 30 minutes of total time (2:45- 3:15 PM) was spent on the date of the encounter performing the following actions: chart review prior to seeing the patient, obtaining history, performing a medically necessary exam, counseling on the treatment plan, placing orders, discussing case with PCP, and documenting in our EHR.   Return precautions advised.  Garret Reddish, MD

## 2020-11-21 NOTE — Patient Instructions (Addendum)
Start doxycycline and continued for 7 days.  Schedule follow-up with Dr. Jerline Pain or me within the next week for recheck. New or worsening symptoms let us know ASAP  Try to elevate the leg as much as possible  Please stop by lab before you go If you have mychart- we will send your results within 3 business days of [place] receiving them.  If you do not have mychart- we will call you about results within 5 business days of [place] receiving them.  *please also note that you will see labs on mychart as soon as they post. I will later go in and write notes on them- will say "notes from Dr. Yong Channel"  Please go to [facility]  - located [address] across the street from [place] - in the basement - Hours: 8:30-5:00 PM M-F (with lunch from 12:30- 1 PM). You do NOT need an appointment.  - Please ensure you are covid symptom free before going in(No fever, chills, cough, congestion, runny nose, shortness of breath, fatigue, body aches, sore throat, headache, nausea, vomiting, diarrhea, or new loss of taste or smell. No known contacts with covid 19 or someone being tested for covid 19)

## 2020-11-22 ENCOUNTER — Ambulatory Visit (INDEPENDENT_AMBULATORY_CARE_PROVIDER_SITE_OTHER)
Admission: RE | Admit: 2020-11-22 | Discharge: 2020-11-22 | Disposition: A | Discharge status: Home / Self Care | Payer: Medicare Other | Source: Ambulatory Visit | Attending: Family Medicine | Admitting: Family Medicine

## 2020-11-22 DIAGNOSIS — Z872 Personal history of diseases of the skin and subcutaneous tissue: Secondary | ICD-10-CM | POA: Diagnosis not present

## 2020-11-22 DIAGNOSIS — M7989 Other specified soft tissue disorders: Secondary | ICD-10-CM | POA: Diagnosis not present

## 2020-11-22 DIAGNOSIS — L539 Erythematous condition, unspecified: Secondary | ICD-10-CM

## 2020-11-22 DIAGNOSIS — I739 Peripheral vascular disease, unspecified: Secondary | ICD-10-CM | POA: Diagnosis not present

## 2020-11-22 LAB — CBC WITH DIFFERENTIAL/PLATELET
Basophils Absolute: 0.1 10*3/uL (ref 0.0–0.1)
Basophils Relative: 2 % (ref 0.0–3.0)
Eosinophils Absolute: 0.3 10*3/uL (ref 0.0–0.7)
Eosinophils Relative: 4.6 % (ref 0.0–5.0)
HCT: 39.7 % (ref 39.0–52.0)
Hemoglobin: 13.3 g/dL (ref 13.0–17.0)
Lymphocytes Relative: 16.3 % (ref 12.0–46.0)
Lymphs Abs: 1.1 10*3/uL (ref 0.7–4.0)
MCHC: 33.6 g/dL (ref 30.0–36.0)
MCV: 88.3 fl (ref 78.0–100.0)
Monocytes Absolute: 0.7 10*3/uL (ref 0.1–1.0)
Monocytes Relative: 11.5 % (ref 3.0–12.0)
Neutro Abs: 4.2 10*3/uL (ref 1.4–7.7)
Neutrophils Relative %: 65.6 % (ref 43.0–77.0)
Platelets: 245 10*3/uL (ref 150.0–400.0)
RBC: 4.49 Mil/uL (ref 4.22–5.81)
RDW: 13.2 % (ref 11.5–15.5)
WBC: 6.5 10*3/uL (ref 4.0–10.5)

## 2020-11-22 LAB — COMPREHENSIVE METABOLIC PANEL
ALT: 9 U/L (ref 0–53)
AST: 11 U/L (ref 0–37)
Albumin: 4.1 g/dL (ref 3.5–5.2)
Alkaline Phosphatase: 59 U/L (ref 39–117)
BUN: 14 mg/dL (ref 6–23)
CO2: 26 mEq/L (ref 19–32)
Calcium: 9 mg/dL (ref 8.4–10.5)
Chloride: 104 mEq/L (ref 96–112)
Creatinine, Ser: 0.76 mg/dL (ref 0.40–1.50)
GFR: 84.54 mL/min (ref >60.00)
Glucose, Bld: 94 mg/dL (ref 70–99)
Potassium: 4.8 mEq/L (ref 3.5–5.1)
Sodium: 138 mEq/L (ref 135–145)
Total Bilirubin: 0.7 mg/dL (ref 0.2–1.2)
Total Protein: 6.4 g/dL (ref 6.0–8.3)

## 2020-11-22 LAB — SEDIMENTATION RATE: Sed Rate: 35 mm/hr — ABNORMAL HIGH (ref 0–20)

## 2020-11-22 LAB — C-REACTIVE PROTEIN: CRP: 2.6 mg/dL (ref 0.5–20.0)

## 2020-11-24 ENCOUNTER — Telehealth: Payer: Self-pay

## 2020-11-24 NOTE — Chronic Care Management (AMB) (Signed)
    Chronic Care Management Pharmacy Assistant   Name: [name]  MRN: [id number] DOB: 1940-06-25  Reason for Encounter: Chart Review  Medications: Outpatient Encounter Medications as of 11/24/2020  Medication Sig   ACCU-CHEK AVIVA PLUS test strip USE TO TEST GLUCOSE ONCE DAILY E11.9   Accu-Chek FastClix Lancets MISC USE TO CHECK BLOOD SUGAR DAILY AND PRN   aspirin 325 MG tablet Take 325 mg by mouth daily.   aspirin-sod bicarb-citric acid (ALKA-SELTZER) 325 MG TBEF tablet Take 650 mg by mouth daily as needed (heart burn).   carvedilol (COREG) 25 MG tablet TAKE 1 TABLET BY MOUTH TWICE A DAY   doxycycline (VIBRA-TABS) 100 MG tablet Take 1 tablet (100 mg total) by mouth 2 (two) times daily for 7 days.   furosemide (LASIX) 20 MG tablet Take 1 tablet (20 mg total) by mouth daily as needed for edema.   gabapentin (NEURONTIN) 100 MG capsule TAKE 1 CAPSULE BY MOUTH THREE TIMES A DAY   halobetasol (ULTRAVATE) 0.05 % cream APPLY TO AFFECTED AREA TWICE A DAY   HYDROcodone-acetaminophen (NORCO/VICODIN) 5-325 MG tablet Take 1 tablet by mouth every 6 (six) hours.   hydrocortisone cream 1 % Apply 1 application topically daily as needed for itching.   loratadine (CLARITIN) 10 MG tablet Take 10 mg by mouth daily as needed for allergies.    LORazepam (ATIVAN) 0.5 MG tablet Take 1 tablet (0.5 mg total) by mouth every 8 (eight) hours as needed for anxiety.   metFORMIN (GLUCOPHAGE) 1000 MG tablet TAKE 1 TABLET BY MOUTH EVERY DAY WITH BREAKFAST   methocarbamol (ROBAXIN) 500 MG tablet Take 1 tablet by mouth 4 (four) times daily.   Multiple Vitamin (MULTIVITAMIN) tablet Take 1 tablet by mouth daily.   mupirocin ointment (BACTROBAN) 2 % Apply 1 application topically 2 (two) times daily.   nystatin cream (MYCOSTATIN) APPLY TO AFFECTED AREA TWICE A DAY   pantoprazole (PROTONIX) 40 MG tablet TAKE 1 TABLET BY MOUTH EVERY DAY   potassium chloride (KLOR-CON) 10 MEQ tablet Take 2 tablets by mouth daily.   potassium  chloride SA (KLOR-CON M20) 20 MEQ tablet TAKE 2 TABLETS BY MOUTH EVERY MORNING   sacubitril-valsartan (ENTRESTO) 49-51 MG TAKE 1 TABLET BY MOUTH TWICE A DAY   sildenafil (VIAGRA) 100 MG tablet TAKE 1 TABLET DAILY AS NEEDED ED   solifenacin (VESICARE) 10 MG tablet Take 10 mg by mouth daily.   tadalafil (CIALIS) 10 MG tablet Take 0.5-1 tablets (5-10 mg total) by mouth daily as needed for erectile dysfunction.   tamsulosin (FLOMAX) 0.4 MG CAPS capsule TAKE 1 CAPSULE BY MOUTH EVERY DAY   Tetrahydrozoline HCl (VISINE OP) Place 1 drop into both eyes daily as needed (dry eyes).   traMADol (ULTRAM) 50 MG tablet Take 1 tablet (50 mg total) by mouth every 6 (six) hours as needed for moderate pain.   No facility-administered encounter medications on file as of 11/24/2020.   Reviewed chart for medication changes and adherence.   Recent OV, Consult or Hospital visit:  11/21/20- Garret Reddish, MD - Cellulitis of right leg   Recent medication changes indicated:  11/21/20-short course doxycycline 100 mg bid x 5 days  No gaps in adherence identified. Patient has follow up scheduled with pharmacy team. No further action required.  Wilford Sports CPA, CMA

## 2020-11-29 ENCOUNTER — Telehealth: Payer: Self-pay

## 2020-11-29 NOTE — Telephone Encounter (Signed)
Patient called in stating that he seen Dr.Hunter last week for the infection in the leg, but has finished the medication and doesn't come in until Thursday. Wound is getting better but still having drainage wondering if this is okay or if he needs to follow up sooner.

## 2020-11-30 NOTE — Telephone Encounter (Signed)
As long as things are getting better we do not need to do anything else.  Algis Greenhouse. Jerline Pain, MD 11/30/2020 9:29 AM

## 2020-11-30 NOTE — Telephone Encounter (Signed)
See below

## 2020-11-30 NOTE — Telephone Encounter (Signed)
Called and spoke with pt and pt made aware.

## 2020-12-01 ENCOUNTER — Other Ambulatory Visit: Payer: Self-pay

## 2020-12-01 ENCOUNTER — Encounter: Payer: Self-pay | Admitting: Family Medicine

## 2020-12-01 ENCOUNTER — Ambulatory Visit (INDEPENDENT_AMBULATORY_CARE_PROVIDER_SITE_OTHER): Payer: Medicare Other | Admitting: Family Medicine

## 2020-12-01 VITALS — BP 115/71 | HR 68 | Temp 98.2°F | Ht 72.0 in | Wt 214.0 lb

## 2020-12-01 DIAGNOSIS — R739 Hyperglycemia, unspecified: Secondary | ICD-10-CM

## 2020-12-01 DIAGNOSIS — Z89511 Acquired absence of right leg below knee: Secondary | ICD-10-CM

## 2020-12-01 DIAGNOSIS — I10 Essential (primary) hypertension: Secondary | ICD-10-CM

## 2020-12-01 DIAGNOSIS — L0390 Cellulitis, unspecified: Secondary | ICD-10-CM

## 2020-12-01 MED ORDER — DOXYCYCLINE HYCLATE 100 MG PO TABS
100.0000 mg | ORAL_TABLET | Freq: Two times a day (BID) | ORAL | 0 refills | Status: DC
Start: 2020-12-01 — End: 2021-03-24

## 2020-12-01 NOTE — Assessment & Plan Note (Signed)
At goal today on Coreg 25 mg daily and Entresto 49-51 twice daily.

## 2020-12-01 NOTE — Progress Notes (Signed)
   [name] is a 79 y.o. male who presents today for an office visit.  Assessment/Plan:  New/Acute Problems: Cellulitis Has improved with antibiotics though still has a small amount of residual cellulitis.  We will extended course for another couple of weeks.  Had work-up for osteomyelitis which was negative-do not need to repeat.  He will let me know if symptoms do not improve over the next couple of weeks.  Chronic Problems Addressed Today: Essential hypertension At goal today on Coreg 25 mg daily and Entresto 49-51 twice daily.  Hyperglycemia Last A1c 5.4 on metformin 1000 mg daily.  We will recheck A1c when he comes back in for annual follow-up visit.  H/O amputation of leg through tibia and fibula, right (HCC) Pain is relatively well controlled.  He is having some more edema to right stump.  He has been using Lasix which helps.  Also discussed importance of compression and leg elevation.     Subjective:  HPI:  Patient here for follow-up.  Seen about a week and a half ago by different physician for cellulitis on right leg stump.  Started on doxycycline.  Symptoms have improved.  Still has a bit of warmth and drainage to the area.  No fevers or chills.       Objective:  Physical Exam: BP 115/71   Pulse 68   Temp 98.2 F (36.8 C) (Temporal)   Ht 6' (1.829 m)   Wt 214 lb (97.1 kg)   SpO2 97%   BMI 29.02 kg/m   Gen: No acute distress, resting comfortably Skin: Very small amount of erythema surrounding approximately 1.5 cm open wound on distal aspect of right leg stump.  Tender to palpation.  Serosanguineous drainage present. Neuro: Grossly normal, moves all extremities Psych: Normal affect and thought content      Lotus Santillo M. Jerline Pain, MD 12/01/2020 12:28 PM

## 2020-12-01 NOTE — Assessment & Plan Note (Signed)
Last A1c 5.4 on metformin 1000 mg daily.  We will recheck A1c when he comes back in for annual follow-up visit.

## 2020-12-01 NOTE — Patient Instructions (Signed)
It was very nice to see you today!  Please take the antibiotic for 2 more weeks.  I will send this in for you today.  I will see you back later this fall for your annual checkup.  Please select to see me sooner if needed.  Take care, Dr Jerline Pain  PLEASE NOTE:  If you had any lab tests please let us know if you have not heard back within a few days. You may see your results on mychart before we have a chance to review them but we will give you a call once they are reviewed by [place]. If we ordered any referrals today, please let us know if you have not heard from their office within the next week.   Please try these tips to maintain a healthy lifestyle:  Eat at least 3 REAL meals and 1-2 snacks per day.  Aim for no more than 5 hours between eating.  If you eat breakfast, please do so within one hour of getting up.   Each meal should contain half fruits/vegetables, one quarter protein, and one quarter carbs (no bigger than a computer mouse)  Cut down on sweet beverages. This includes juice, soda, and sweet tea.   Drink at least 1 glass of water with each meal and aim for at least 8 glasses per day  Exercise at least 150 minutes every week.

## 2020-12-01 NOTE — Assessment & Plan Note (Signed)
Pain is relatively well controlled.  He is having some more edema to right stump.  He has been using Lasix which helps.  Also discussed importance of compression and leg elevation.

## 2020-12-09 ENCOUNTER — Other Ambulatory Visit: Payer: Self-pay | Admitting: Family Medicine

## 2020-12-12 ENCOUNTER — Other Ambulatory Visit: Payer: Self-pay | Admitting: Family Medicine

## 2020-12-12 DIAGNOSIS — Z961 Presence of intraocular lens: Secondary | ICD-10-CM | POA: Diagnosis not present

## 2020-12-12 DIAGNOSIS — F419 Anxiety disorder, unspecified: Secondary | ICD-10-CM

## 2020-12-12 DIAGNOSIS — H26491 Other secondary cataract, right eye: Secondary | ICD-10-CM | POA: Diagnosis not present

## 2020-12-22 ENCOUNTER — Telehealth: Payer: Self-pay | Admitting: *Deleted

## 2020-12-22 ENCOUNTER — Telehealth: Payer: Self-pay

## 2020-12-22 NOTE — Telephone Encounter (Signed)
He needs appointment or urgent care.  [name]. Jerline Pain, MD 12/22/2020 11:46 AM

## 2020-12-22 NOTE — Telephone Encounter (Signed)
I advised triage nurse that we had no openings today or tomorrow with anyone. Suanne Marker then said she will have patient go to [facility] or ED.  Nurse Assessment Nurse: Cira Servant, RN, Suanne Marker Date/Time (Eastern Time): 12/22/2020 10:17:56 AM Confirm and document reason for call. If symptomatic, describe symptoms. ---Caller states he is experiencing leg infection with swelling. Recently took abx for cellulitis. Does the patient have any new or worsening symptoms? ---Yes Will a triage be completed? ---Yes Related visit to physician within the last 2 weeks? ---Yes Does the PT have any chronic conditions? (i.e. diabetes, asthma, this includes High risk factors for pregnancy, etc.) ---Yes List chronic conditions. ---diabetes Is this a behavioral health or substance abuse call? ---No Guidelines Guideline Title Affirmed Question Affirmed Notes Nurse Date/Time Eilene Ghazi Time) Leg Swelling and Edema [1] Red area or streak [2] large (> 2 in. or 5 cm) Cira Servant, RN, Suanne Marker 12/22/2020 10:20:17 AM Disp. Time Eilene Ghazi Time) Disposition Final User 12/22/2020 10:25:25 AM See HCP within 4 Hours (or PCP triage) Yes Cira Servant, RN, Suanne Marker PLEASE NOTE: All timestamps contained within this report are represented as [place] Standard Time. CONFIDENTIALTY NOTICE: This fax transmission is intended only for the addressee. It contains information that is legally privileged, confidential or otherwise protected from use or disclosure. If you are not the intended recipient, you are strictly prohibited from reviewing, disclosing, copying using or disseminating any of this information or taking any action in reliance on or regarding this information. If you have received this fax in error, please notify us immediately by telephone so that we can arrange for its return to [place]. Phone: [phone], Toll-Free: [phone], Fax: [phone] Page: 2 of 2 Call Id: [id number] [place] Disagree/Comply Comply Caller Understands  Yes PreDisposition Call Doctor Care Advice Given Per Guideline SEE HCP (OR PCP TRIAGE) WITHIN 4 HOURS: * IF OFFICE WILL BE OPEN: You need to be seen within the next 3 or 4 hours. Call your doctor (or NP/PA) now or as soon as the office opens. CALL BACK IF: * You become worse CARE ADVICE given per Leg Swelling and Edema (Adult) guideline. Referrals GO TO FACILITY UNDECIDED

## 2020-12-22 NOTE — Telephone Encounter (Signed)
Please see message waiting on Triage note.

## 2020-12-22 NOTE — Telephone Encounter (Signed)
Patient called stating that he has seen Dr. Jerline Pain a month ago for infection in his leg.  States that the infection has not cleared up and that his leg is swelling.  States this is a leg that is amputated.  States he can not wear his prosthetic.  I have sent patient to team health for triage.

## 2020-12-22 NOTE — Telephone Encounter (Signed)
Please schedule appointment with pt.   Thank You. 

## 2020-12-22 NOTE — Telephone Encounter (Signed)
Patient present for signature on Rx shrinlcers Rt BKA  Rx sign and faxed to [phone] Patient aware

## 2020-12-22 NOTE — Telephone Encounter (Signed)
Dr. Jerline Pain, here is the Triage note.

## 2020-12-22 NOTE — Telephone Encounter (Signed)
Patient stated he went to [facility].

## 2020-12-26 DIAGNOSIS — C44329 Squamous cell carcinoma of skin of other parts of face: Secondary | ICD-10-CM | POA: Diagnosis not present

## 2020-12-26 DIAGNOSIS — D485 Neoplasm of uncertain behavior of skin: Secondary | ICD-10-CM | POA: Diagnosis not present

## 2020-12-26 DIAGNOSIS — C44319 Basal cell carcinoma of skin of other parts of face: Secondary | ICD-10-CM | POA: Diagnosis not present

## 2020-12-26 DIAGNOSIS — L570 Actinic keratosis: Secondary | ICD-10-CM | POA: Diagnosis not present

## 2020-12-26 DIAGNOSIS — Z85828 Personal history of other malignant neoplasm of skin: Secondary | ICD-10-CM | POA: Diagnosis not present

## 2020-12-26 DIAGNOSIS — D692 Other nonthrombocytopenic purpura: Secondary | ICD-10-CM | POA: Diagnosis not present

## 2020-12-29 DIAGNOSIS — H26491 Other secondary cataract, right eye: Secondary | ICD-10-CM | POA: Diagnosis not present

## 2021-01-03 ENCOUNTER — Encounter: Payer: Self-pay | Admitting: Family Medicine

## 2021-01-03 ENCOUNTER — Ambulatory Visit (INDEPENDENT_AMBULATORY_CARE_PROVIDER_SITE_OTHER): Payer: Medicare Other | Admitting: Family Medicine

## 2021-01-03 ENCOUNTER — Other Ambulatory Visit: Payer: Self-pay

## 2021-01-03 VITALS — BP 110/72 | HR 89 | Temp 98.4°F | Ht 72.0 in

## 2021-01-03 DIAGNOSIS — I10 Essential (primary) hypertension: Secondary | ICD-10-CM | POA: Diagnosis not present

## 2021-01-03 DIAGNOSIS — Z89511 Acquired absence of right leg below knee: Secondary | ICD-10-CM | POA: Diagnosis not present

## 2021-01-03 DIAGNOSIS — G546 Phantom limb syndrome with pain: Secondary | ICD-10-CM | POA: Diagnosis not present

## 2021-01-03 DIAGNOSIS — R739 Hyperglycemia, unspecified: Secondary | ICD-10-CM

## 2021-01-03 NOTE — Patient Instructions (Signed)
It was very nice to see you today!  I am glad that you are doing well.  No changes today.  I will see back in 6 months.  Please come back to see me sooner if needed.  Take care, Dr Jerline Pain  PLEASE NOTE:  If you had any lab tests please let us know if you have not heard back within a few days. You may see your results on mychart before we have a chance to review them but we will give you a call once they are reviewed by [place]. If we ordered any referrals today, please let us know if you have not heard from their office within the next week.   Please try these tips to maintain a healthy lifestyle:  Eat at least 3 REAL meals and 1-2 snacks per day.  Aim for no more than 5 hours between eating.  If you eat breakfast, please do so within one hour of getting up.   Each meal should contain half fruits/vegetables, one quarter protein, and one quarter carbs (no bigger than a computer mouse)  Cut down on sweet beverages. This includes juice, soda, and sweet tea.   Drink at least 1 glass of water with each meal and aim for at least 8 glasses per day  Exercise at least 150 minutes every week.

## 2021-01-03 NOTE — Progress Notes (Signed)
   [name] is a 80 y.o. male who presents today for an office visit.  Assessment/Plan:  Chronic Problems Addressed Today: Essential hypertension At goal.  Continue current dose of Coreg 25 mg twice daily and Entresto 49-51 twice daily.  H/O amputation of leg through tibia and fibula, right (HCC) Doing well with new prosthesis and sleeve.  Discussed referral to therapy however he feels like he is doing okay.  He will let me know if he has any issues and would like to be referred back to PCP.  Phantom limb pain (HCC) On gabapentin 100 mg 3 times daily.  Hyperglycemia Last A1c at goal.  Continue metformin 1000 mg daily.  Recheck A1c when he comes back in for annual follow-up visit.    Subjective:  HPI:  He has had cellulitis on right leg stump. He has swelling and redness in the leg but symptoms seems to be improving. He state he tried to walk today. Recently had prosthesis fitted. Seems to be going well.       Objective:  Physical Exam: BP 110/72   Pulse 89   Temp 98.4 F (36.9 C) (Temporal)   Ht 6' (1.829 m)   SpO2 95%   BMI 29.02 kg/m   Gen: No acute distress, resting comfortably CV: Regular rate and rhythm with no murmurs appreciated Pulm: Normal work of breathing, clear to auscultation bilaterally with no crackles, wheezes, or rhonchi Neuro: Grossly normal, moves all extremities Psych: Normal affect and thought content       I,Savera Zaman,acting as a scribe for Dimas Chyle, MD.,have documented all relevant documentation on the behalf of Dimas Chyle, MD,as directed by  Dimas Chyle, MD while in the presence of Dimas Chyle, MD.  I, Dimas Chyle, MD, have reviewed all documentation for this visit. The documentation on 01/03/21 for the exam, diagnosis, procedures, and orders are all accurate and complete.  Algis Greenhouse. Jerline Pain, MD 01/03/2021 10:58 AM

## 2021-01-03 NOTE — Assessment & Plan Note (Signed)
Doing well with new prosthesis and sleeve.  Discussed referral to therapy however he feels like he is doing okay.  He will let me know if he has any issues and would like to be referred back to PCP.

## 2021-01-03 NOTE — Assessment & Plan Note (Signed)
At goal.  Continue current dose of Coreg 25 mg twice daily and Entresto 49-51 twice daily.

## 2021-01-03 NOTE — Assessment & Plan Note (Signed)
On gabapentin 100 mg 3 times daily 

## 2021-01-03 NOTE — Assessment & Plan Note (Signed)
Last A1c at goal.  Continue metformin 1000 mg daily.  Recheck A1c when he comes back in for annual follow-up visit.

## 2021-01-06 ENCOUNTER — Other Ambulatory Visit: Payer: Self-pay | Admitting: Family Medicine

## 2021-01-06 ENCOUNTER — Telehealth: Payer: Self-pay | Admitting: Pharmacist

## 2021-01-06 NOTE — Chronic Care Management (AMB) (Addendum)
Chronic Care Management Pharmacy Assistant   Name: [name]  MRN: [id number] DOB: 29-Dec-1940   Reason for Encounter: Hypertension Adherence Call    Recent office visits:  01/03/2021 OV (PCP) Dimas Chyle MD; chronic follow up, no medication changes indicated.  12/01/2020 OV (PCP) Dimas Chyle MD; chronic follow up, no medication changes indicated.  Recent consult visits:  None  Hospital visits:  None in previous 6 months  Medications: Outpatient Encounter Medications as of 01/06/2021  Medication Sig   ACCU-CHEK AVIVA PLUS test strip USE TO TEST GLUCOSE ONCE DAILY E11.9   Accu-Chek FastClix Lancets MISC USE TO CHECK BLOOD SUGAR DAILY AND PRN   aspirin 325 MG tablet Take 325 mg by mouth daily.   aspirin-sod bicarb-citric acid (ALKA-SELTZER) 325 MG TBEF tablet Take 650 mg by mouth daily as needed (heart burn).   carvedilol (COREG) 25 MG tablet TAKE 1 TABLET BY MOUTH TWICE A DAY   doxycycline (VIBRA-TABS) 100 MG tablet Take 1 tablet (100 mg total) by mouth 2 (two) times daily.   furosemide (LASIX) 20 MG tablet TAKE 1 TABLET (20 MG TOTAL) BY MOUTH DAILY AS NEEDED FOR EDEMA.   gabapentin (NEURONTIN) 100 MG capsule TAKE 1 CAPSULE BY MOUTH THREE TIMES A DAY   halobetasol (ULTRAVATE) 0.05 % cream APPLY TO AFFECTED AREA TWICE A DAY   HYDROcodone-acetaminophen (NORCO/VICODIN) 5-325 MG tablet Take 1 tablet by mouth every 6 (six) hours.   hydrocortisone cream 1 % Apply 1 application topically daily as needed for itching.   loratadine (CLARITIN) 10 MG tablet Take 10 mg by mouth daily as needed for allergies.    LORazepam (ATIVAN) 0.5 MG tablet TAKE 1 TABLET BY MOUTH EVERY 8 HOURS AS NEEDED FOR ANXIETY.   metFORMIN (GLUCOPHAGE) 1000 MG tablet TAKE 1 TABLET BY MOUTH EVERY DAY WITH BREAKFAST   methocarbamol (ROBAXIN) 500 MG tablet Take 1 tablet by mouth 4 (four) times daily.   Multiple Vitamin (MULTIVITAMIN) tablet Take 1 tablet by mouth daily.   mupirocin ointment (BACTROBAN) 2 %  Apply 1 application topically 2 (two) times daily.   nystatin cream (MYCOSTATIN) APPLY TO AFFECTED AREA TWICE A DAY   pantoprazole (PROTONIX) 40 MG tablet TAKE 1 TABLET BY MOUTH EVERY DAY   potassium chloride (KLOR-CON) 10 MEQ tablet Take 2 tablets by mouth daily.   potassium chloride SA (KLOR-CON M20) 20 MEQ tablet TAKE 2 TABLETS BY MOUTH EVERY MORNING   sacubitril-valsartan (ENTRESTO) 49-51 MG TAKE 1 TABLET BY MOUTH TWICE A DAY   sildenafil (VIAGRA) 100 MG tablet TAKE 1 TABLET DAILY AS NEEDED ED   solifenacin (VESICARE) 10 MG tablet Take 10 mg by mouth daily.   tadalafil (CIALIS) 10 MG tablet Take 0.5-1 tablets (5-10 mg total) by mouth daily as needed for erectile dysfunction.   tamsulosin (FLOMAX) 0.4 MG CAPS capsule TAKE 1 CAPSULE BY MOUTH EVERY DAY   Tetrahydrozoline HCl (VISINE OP) Place 1 drop into both eyes daily as needed (dry eyes).   traMADol (ULTRAM) 50 MG tablet Take 1 tablet (50 mg total) by mouth every 6 (six) hours as needed for moderate pain.   No facility-administered encounter medications on file as of 01/06/2021.    Reviewed chart prior to disease state call. Spoke with patient regarding BP  Recent Office Vitals: BP Readings from Last 3 Encounters:  01/03/21 110/72  12/01/20 115/71  11/21/20 120/74   Pulse Readings from Last 3 Encounters:  01/03/21 89  12/01/20 68  11/21/20 66    Wt Readings  from Last 3 Encounters:  12/01/20 214 lb (97.1 kg)  10/11/20 227 lb 6.4 oz (103.1 kg)  08/09/20 224 lb 12.8 oz (102 kg)     Kidney Function Lab Results  Component Value Date/Time   CREATININE 0.76 11/21/2020 03:22 PM   CREATININE 0.86 07/07/2020 10:02 AM   CREATININE 0.79 01/23/2016 12:03 PM   GFR 84.54 11/21/2020 03:22 PM   GFRNONAA 86 07/10/2018 10:39 AM   GFRAA 99 07/10/2018 10:39 AM    BMP Latest Ref Rng & Units 11/21/2020 07/07/2020 09/04/2019  Glucose 70 - 99 mg/dL 94 106(H) 100(H)  BUN 6 - 23 mg/dL '14 17 17  '$ Creatinine 0.40 - 1.50 mg/dL 0.76 0.86 0.68   BUN/Creat Ratio 10 - 24 - - -  Sodium 135 - 145 mEq/L 138 143 140  Potassium 3.5 - 5.1 mEq/L 4.8 4.3 4.6  Chloride 96 - 112 mEq/L 104 108 103  CO2 19 - 32 mEq/L '26 27 28  '$ Calcium 8.4 - 10.5 mg/dL 9.0 9.2 9.3    Current antihypertensive regimen:  Entresto 49-51 mg twice daily Carvedilol 25 mg twice a day  How often are you checking your Blood Pressure? daily  Current home BP readings: 122/70  What recent interventions/DTPs have been made by any provider to improve Blood Pressure control since last CPP Visit: No interventions or DTPs.  Any recent hospitalizations or ED visits since last visit with CPP? No  What diet changes have been made to improve Blood Pressure Control?  Patient states he is not currently dieting and hasn't made any recent changes to his diet.  What exercise is being done to improve your Blood Pressure Control?  Patient states he is not exercising at this time.  Adherence Review: Is the patient currently on ACE/ARB medication? Yes Does the patient have >5 day gap between last estimated fill dates? No  Patient states he needs to see a doctor about a rash on his face. Patient described the rash as red bumps that are spreading. He does not think this rash is related to his medications that he is aware of. I sent a message to his PCP ([facility]) stating he would like a call back to schedule an appointment.  Future Appointments  Date Time Provider [place]  02/21/2021  1:30 PM [facility] CCM PHARMACIST [facility]     Star Rating Drugs: Entresto 49-51 mg last filled 12/19/2020 90 DS Metformin 1000 mg last filled 12/07/2020 90 DS  April D Calhoun, [place] Pharmacist Assistant [phone]   10 minutes spent in review, coordination, and documentation.  Reviewed by: Beverly Milch, PharmD Clinical Pharmacist [phone]

## 2021-01-18 ENCOUNTER — Telehealth: Payer: Self-pay | Admitting: *Deleted

## 2021-01-18 NOTE — Telephone Encounter (Signed)
Please contact pt and schedule an appt.

## 2021-01-18 NOTE — Telephone Encounter (Signed)
-----   Message from April D Calhoun sent at 01/17/2021 11:15 AM EDT ----- Regarding: rash on face Patient states he has a rash on his face that is spreading. He would like to schedule an office visit for evaluation.  Thanks

## 2021-01-19 NOTE — Telephone Encounter (Signed)
LVM for patient to call back and schedule appt

## 2021-02-01 ENCOUNTER — Telehealth: Payer: Self-pay | Admitting: Internal Medicine

## 2021-02-01 NOTE — Telephone Encounter (Signed)
Called pt in regards to [facility] cost.  He reports that his pharmacy told him cost of med is going up because of Dr. Gabriel Carina.  I called pharmacy and was told cost increase is d/t insurance coverage.  I called pt back gave him the number for [facility] pt assistance and strongly encouraged him to call to see if he qualifies.  Per pt he has been on med for 3 years with no trouble.  I have reached out to [facility] to get pt samples while pt inquires about medication assistance.  If pt does not qualify for assistance he will need medication change.

## 2021-02-01 NOTE — Telephone Encounter (Signed)
1 bottle of entresto 49/'51mg'$  left for patient to pick up at [facility] office #28 tablets  Provided patient assistance application in bag

## 2021-02-01 NOTE — Telephone Encounter (Signed)
Pt c/o medication issue:  1. Name of Medication: Entresto  2. How are you currently taking this medication (dosage and times per day)?   3. Are you having a reaction (difficulty breathing--STAT)?    4. What is your medication issue?    Can not afford it, what can he take in the place of it

## 2021-02-17 NOTE — Progress Notes (Deleted)
Chronic Care Management Pharmacy Note  02/17/2021 Name:  [name] MRN:  [id number] DOB:  06-08-1940  Summary: ***  Recommendations/Changes made from today's visit: ***  Plan: ***   Subjective: [name] is an 80 y.o. year old male who is a primary patient of Jerline Pain, Algis Greenhouse, MD.  The CCM team was consulted for assistance with disease management and care coordination needs.    Engaged with patient by telephone for follow up visit in response to provider referral for pharmacy case management and/or care coordination services.   Consent to Services:  The patient was given the following information about Chronic Care Management services today, agreed to services, and gave verbal consent: 1. CCM service includes personalized support from designated clinical staff supervised by the primary care provider, including individualized plan of care and coordination with other care providers 2. 24/7 contact phone numbers for assistance for urgent and routine care needs. 3. Service will only be billed when office clinical staff spend 20 minutes or more in a month to coordinate care. 4. Only one practitioner may furnish and bill the service in a calendar month. 5.The patient may stop CCM services at any time (effective at the end of the month) by phone call to the office staff. 6. The patient will be responsible for cost sharing (co-pay) of up to 20% of the service fee (after annual deductible is met). Patient agreed to services and consent obtained.  Patient Care Team: Vivi Barrack, MD as PCP - General (Family Medicine) Debara Pickett Nadean Corwin, MD as PCP - Cardiology (Cardiology) Katy Apo, MD as Consulting Physician (Ophthalmology) Madelin Rear,  [facility] as Pharmacist (Pharmacist)  Recent office visits:  01/03/2021 OV (PCP) Dimas Chyle MD; chronic follow up, no medication changes indicated.   12/01/2020 OV (PCP) Dimas Chyle MD; chronic follow up, no medication changes indicated.    Recent consult visits:  None   Hospital visits:  None in previous 6 months   Objective:  Lab Results  Component Value Date   CREATININE 0.76 11/21/2020   BUN 14 11/21/2020   GFR 84.54 11/21/2020   GFRNONAA 86 07/10/2018   GFRAA 99 07/10/2018   NA 138 11/21/2020   K 4.8 11/21/2020   CALCIUM 9.0 11/21/2020   CO2 26 11/21/2020   GLUCOSE 94 11/21/2020    Lab Results  Component Value Date/Time   HGBA1C 5.4 03/11/2020 08:52 AM   HGBA1C 5.8 09/04/2019 09:18 AM   HGBA1C 5.9 (H) 10/30/2017 10:48 AM   GFR 84.54 11/21/2020 03:22 PM   GFR 81.66 07/07/2020 10:02 AM   MICROALBUR 0.7 09/11/2016 12:06 PM   MICROALBUR <0.7 07/12/2015 04:35 PM    Last diabetic Eye exam:  Lab Results  Component Value Date/Time   HMDIABEYEEXA No Retinopathy 08/18/2020 12:00 AM    Last diabetic Foot exam: No results found for: HMDIABFOOTEX   Lab Results  Component Value Date   CHOL 148 09/04/2019   HDL 36.00 (L) 09/04/2019   LDLCALC 93 09/04/2019   LDLDIRECT 84.0 09/10/2016   TRIG 94.0 09/04/2019   CHOLHDL 4 09/04/2019    Hepatic Function Latest Ref Rng & Units 11/21/2020 07/07/2020 09/04/2019  Total Protein 6.0 - 8.3 g/dL 6.4 6.6 6.4  Albumin 3.5 - 5.2 g/dL 4.1 4.1 4.3  AST 0 - 37 U/L _0 ALT 0 - 53 U/L _1 Alk Phosphatase 39 - 117 U/L 59 61 58  Total Bilirubin 0.2 - 1.2 mg/dL 0.7 0.7 0.8  Bilirubin,  Direct 0.0 - 0.3 mg/dL - - -    Lab Results  Component Value Date/Time   TSH 1.37 07/07/2020 10:02 AM   TSH 1.92 09/04/2019 09:18 AM    CBC Latest Ref Rng & Units 11/21/2020 07/07/2020 09/04/2019  WBC 4.0 - 10.5 K/uL 6.5 4.9 6.7  Hemoglobin 13.0 - 17.0 g/dL 13.3 14.2 13.8  Hematocrit 39.0 - 52.0 % 39.7 41.3 40.2  Platelets 150.0 - 400.0 K/uL 245.0 206.0 204.0    No results found for: VD25OH  Clinical ASCVD: {YES/NO:21197} The ASCVD Risk score (Arnett DK, et al., 2019) failed to calculate for the following reasons:   The 2019 ASCVD risk score is only valid for ages 45 to 90     Depression screen PHQ 2/9 01/03/2021 12/01/2020 03/11/2020  Decreased Interest 1 1 0  Down, Depressed, Hopeless 1 1 0  PHQ - 2 Score 2 2 0  Altered sleeping 0 0 0  Tired, decreased energy 1 1 0  Change in appetite 0 0 0  Feeling bad or failure about yourself  0 1 0  Trouble concentrating 0 0 0  Moving slowly or fidgety/restless 0 0 0  Suicidal thoughts 0 0 0  PHQ-9 Score 3 4 0  Difficult doing work/chores Not difficult at all Somewhat difficult Not difficult at all  Some recent data might be hidden     ***Other: (CHADS2VASc if Afib, MMRC or CAT for COPD, ACT, DEXA)  Social History   Tobacco Use  Smoking Status Former   Years: 20.00   Types: Cigarettes   Quit date: 07/26/1981   Years since quitting: 39.5  Smokeless Tobacco Never   BP Readings from Last 3 Encounters:  01/03/21 110/72  12/01/20 115/71  11/21/20 120/74   Pulse Readings from Last 3 Encounters:  01/03/21 89  12/01/20 68  11/21/20 66   Wt Readings from Last 3 Encounters:  12/01/20 214 lb (97.1 kg)  10/11/20 227 lb 6.4 oz (103.1 kg)  08/09/20 224 lb 12.8 oz (102 kg)   BMI Readings from Last 3 Encounters:  01/03/21 29.02 kg/m  12/01/20 29.02 kg/m  11/21/20 30.84 kg/m    Assessment/Interventions: Review of patient past medical history, allergies, medications, health status, including review of consultants reports, laboratory and other test data, was performed as part of comprehensive evaluation and provision of chronic care management services.   SDOH:  (Social Determinants of Health) assessments and interventions performed: {yes/no:20286}  SDOH Screenings   Alcohol Screen: Not on file  Depression (PHQ2-9): Low Risk    PHQ-2 Score: 3  Financial Resource Strain: Not on file  Food Insecurity: No Food Insecurity   Worried About Charity fundraiser in the Last Year: Never true   Ran Out of Food in the Last Year: Never true  Housing: Not on file  Physical Activity: Not on file  Social Connections:  Not on file  Stress: Not on file  Tobacco Use: Medium Risk   Smoking Tobacco Use: Former   Smokeless Tobacco Use: Never  Transportation Needs: No Transportation Needs   Lack of Transportation (Medical): No   Lack of Transportation (Non-Medical): No    CCM Care Plan  Allergies  Allergen Reactions   Lisinopril Cough    Medications Reviewed Today     Reviewed by Betti Cruz, RMA (Registered Medical Assistant) on 12/01/20 at 1124  Med List Status: <None>   Medication Order Taking? Sig Documenting Provider Last Dose Status Informant  ACCU-CHEK AVIVA PLUS test strip [id number] No USE  TO TEST GLUCOSE ONCE DAILY E11.9 Vivi Barrack, MD Taking Active   Accu-Chek FastClix Lancets MISC [id number] No USE TO CHECK BLOOD SUGAR DAILY AND PRN Vivi Barrack, MD Taking Active   aspirin 325 MG tablet [id number] No Take 325 mg by mouth daily. [provider] Taking Active   aspirin-sod bicarb-citric acid (ALKA-SELTZER) 325 MG TBEF tablet [id number] No Take 650 mg by mouth daily as needed (heart burn). [provider] Taking Active Self  carvedilol (COREG) 25 MG tablet [id number] No TAKE 1 TABLET BY MOUTH TWICE A DAY Hilty, Nadean Corwin, MD Taking Active   furosemide (LASIX) 20 MG tablet [id number] No Take 1 tablet (20 mg total) by mouth daily as needed for edema. Vivi Barrack, MD Taking Active   gabapentin (NEURONTIN) 100 MG capsule [id number] No TAKE 1 CAPSULE BY MOUTH THREE TIMES A DAY Vivi Barrack, MD Taking Active   halobetasol (ULTRAVATE) 0.05 % cream [id number] No APPLY TO AFFECTED AREA TWICE A DAY Vivi Barrack, MD Taking Active   HYDROcodone-acetaminophen (NORCO/VICODIN) 5-325 MG tablet [id number] No Take 1 tablet by mouth every 6 (six) hours. [provider] Taking Active   hydrocortisone cream 1 % [id number] No Apply 1 application topically daily as needed for itching. [provider] Taking Active Self  loratadine (CLARITIN) 10 MG tablet [id number] No  Take 10 mg by mouth daily as needed for allergies.  [provider] Taking Active Self  LORazepam (ATIVAN) 0.5 MG tablet [id number] No Take 1 tablet (0.5 mg total) by mouth every 8 (eight) hours as needed for anxiety. Vivi Barrack, MD Taking Active   metFORMIN (GLUCOPHAGE) 1000 MG tablet [id number] No TAKE 1 TABLET BY MOUTH EVERY DAY WITH BREAKFAST Vivi Barrack, MD Taking Active   methocarbamol (ROBAXIN) 500 MG tablet [id number] No Take 1 tablet by mouth 4 (four) times daily. [provider] Taking Active   Multiple Vitamin (MULTIVITAMIN) tablet [id number] No Take 1 tablet by mouth daily. [provider] Taking Active Self  mupirocin ointment (BACTROBAN) 2 % [id number] No Apply 1 application topically 2 (two) times daily. Trula Slade, DPM Taking Active   nystatin cream (MYCOSTATIN) [id number] No APPLY TO AFFECTED AREA TWICE A DAY Vivi Barrack, MD Taking Active   pantoprazole (PROTONIX) 40 MG tablet [id number] No TAKE 1 TABLET BY MOUTH EVERY DAY Vivi Barrack, MD Taking Active   potassium chloride (KLOR-CON) 10 MEQ tablet [id number] No Take 2 tablets by mouth daily. [provider] Taking Active   potassium chloride SA (KLOR-CON M20) 20 MEQ tablet [id number] No TAKE 2 TABLETS BY MOUTH EVERY MORNING Vivi Barrack, MD Taking Active   sacubitril-valsartan [facility]) 49-51 MG [id number] No TAKE 1 TABLET BY MOUTH TWICE A DAY Hilty, Nadean Corwin, MD Taking Active   sildenafil (VIAGRA) 100 MG tablet [id number] No TAKE 1 TABLET DAILY AS NEEDED ED Vivi Barrack, MD Taking Active   solifenacin (VESICARE) 10 MG tablet [id number] No Take 10 mg by mouth daily. [provider] Taking Active   tadalafil (CIALIS) 10 MG tablet [id number] No Take 0.5-1 tablets (5-10 mg total) by mouth daily as needed for erectile dysfunction. Vivi Barrack, MD Taking Active   tamsulosin [facility]) 0.4 MG CAPS capsule [id number] No TAKE 1 CAPSULE BY MOUTH EVERY DAY Hilty, Nadean Corwin, MD  Taking Active   Tetrahydrozoline HCl (VISINE OP) [id number] No Place 1 drop into both eyes daily as needed (dry eyes). [provider] Taking Active Self  traMADol (  ULTRAM) 50 MG tablet [id number] No Take 1 tablet (50 mg total) by mouth every 6 (six) hours as needed for moderate pain. Vivi Barrack, MD Taking Active             Patient Active Problem List   Diagnosis Date Noted   Gastroesophageal reflux disease 12/08/2019   Dermatitis 03/10/2018   Benign prostatic hyperplasia with nocturia 11/12/2017   Nonischemic cardiomyopathy ([place]) 09/03/2017   AVNRT (AV nodal re-entry tachycardia) ([place]) 01/30/2017   Chronic midline low back pain without sciatica 01/03/2017   H/O amputation of leg through tibia and fibula, right ([place]) 05/03/2016   Phantom limb pain ([place]) 03/06/2016   SVT (supraventricular tachycardia) ([place]) 22/63/3354   Chronic systolic heart failure ([place]) 01/20/2016   Diabetic peripheral neuropathy ([place]) 08/23/2015   Esophageal dysphagia 08/06/2014   Anxiety state 10/26/2013   Hyperglycemia 11/02/2010   BASAL CELL CARCINOMA, FACE 10/18/2008   LIPOMA, SKIN 05/31/2008   ERECTILE DYSFUNCTION 09/30/2007   OSTEOARTHRITIS 08/08/2007   Essential hypertension 03/24/2007    Immunization History  Administered Date(s) Administered   Fluad Quad(high Dose 65+) 01/27/2019, 03/11/2020   Influenza Split 04/10/2011   Influenza Whole 02/25/2009   Influenza, High Dose Seasonal PF 05/28/2016, 03/20/2017, 04/27/2017   Influenza-Unspecified 02/25/2013, 02/26/2015   Pneumococcal Polysaccharide-23 05/28/2005   Td 05/28/2006, 09/10/2016   Varicella 04/08/2012    Conditions to be addressed/monitored:  HTN, CHF, GERD, Neuropathy, Hyperglycemia  There are no care plans that you recently modified to display for this patient.    Medication Assistance: {MEDASSISTANCEINFO:25044}  Compliance/Adherence/Medication fill history: Care Gaps: ***  Star-Rating Drugs: ***  Patient's  preferred pharmacy is:  [facility], [place] - [address] [address] Phone: [phone] Fax: Lynn Solen, [facility] [address] [place] [address] Phone: [phone] Fax: [phone]  Uses pill box? {Yes or If no, why not?:20788} Pt endorses ***% compliance  We discussed: {Pharmacy options:24294} Patient decided to: {US Pharmacy Plan:23885}  Care Plan and Follow Up Patient Decision:  {FOLLOWUP:24991}  Plan: {CM FOLLOW UP OZYY:48250}  ***  Current Barriers:  {pharmacybarriers:24917}  Pharmacist Clinical Goal(s):  Patient will {PHARMACYGOALCHOICES:24921} through collaboration with PharmD and provider.   Interventions: 1:1 collaboration with Vivi Barrack, MD regarding development and update of comprehensive plan of care as evidenced by provider attestation and co-signature Inter-disciplinary care team collaboration (see longitudinal plan of care) Comprehensive medication review performed; medication list updated in electronic medical record  Hypertension (BP goal {CHL HP UPSTREAM Pharmacist BP ranges:[phone]}) -{US controlled/uncontrolled:25276} -Current treatment: *** -Medications previously tried: ***  -Current home readings: *** -Current dietary habits: *** -Current exercise habits: *** -{ACTIONS;DENIES/REPORTS:[id number]::"Denies"} hypotensive/hypertensive symptoms -Educated on {CCM BP Counseling:25124} -Counseled to monitor BP at home ***, document, and provide log at future appointments -{CCMPHARMDINTERVENTION:25122}  Heart Failure (Goal: manage symptoms and prevent exacerbations) -{US controlled/uncontrolled:25276} -Last ejection fraction: *** (Date: ***) -HF type: {type of heart failure:30421350} -NYHA Class: {CHL HP Upstream Pharm NYHA Class:[phone]} -AHA HF Stage: {CHL HP Upstream Pharm AHA HF  [phone]} -Current treatment: *** -Medications previously tried: ***  -Current home BP/HR readings: *** -Current dietary habits: *** -Current exercise habits: *** -Educated on {CCM HF Counseling:25125} -{CCMPHARMDINTERVENTION:25122}  Hyperglycemia (Goal: ***) -{US controlled/uncontrolled:25276} -Current treatment  *** -Medications previously tried: ***  -{CCMPHARMDINTERVENTION:25122}  Patient Goals/Self-Care Activities Patient will:  - {pharmacypatientgoals:24919}  Follow Up Plan: {CM FOLLOW UP IBBC:48889}

## 2021-02-20 ENCOUNTER — Telehealth: Payer: Self-pay | Admitting: Pharmacist

## 2021-02-20 NOTE — Chronic Care Management (AMB) (Signed)
    Chronic Care Management Pharmacy Assistant   Name: [name]  MRN: [id number] DOB: [birth year]   Reason for Encounter: Reschedule Appointment With Clinical Pharmacist    Medications: Outpatient Encounter Medications as of 02/20/2021  Medication Sig   ACCU-CHEK AVIVA PLUS test strip USE TO TEST GLUCOSE ONCE DAILY E11.9   Accu-Chek FastClix Lancets MISC USE TO CHECK BLOOD SUGAR DAILY AND AS NEEDED   aspirin 325 MG tablet Take 325 mg by mouth daily.   aspirin-sod bicarb-citric acid (ALKA-SELTZER) 325 MG TBEF tablet Take 650 mg by mouth daily as needed (heart burn).   carvedilol (COREG) 25 MG tablet TAKE 1 TABLET BY MOUTH TWICE A DAY   doxycycline (VIBRA-TABS) 100 MG tablet Take 1 tablet (100 mg total) by mouth 2 (two) times daily.   furosemide (LASIX) 20 MG tablet TAKE 1 TABLET (20 MG TOTAL) BY MOUTH DAILY AS NEEDED FOR EDEMA.   gabapentin (NEURONTIN) 100 MG capsule TAKE 1 CAPSULE BY MOUTH THREE TIMES A DAY   halobetasol (ULTRAVATE) 0.05 % cream APPLY TO AFFECTED AREA TWICE A DAY   HYDROcodone-acetaminophen (NORCO/VICODIN) 5-325 MG tablet Take 1 tablet by mouth every 6 (six) hours.   hydrocortisone cream 1 % Apply 1 application topically daily as needed for itching.   loratadine (CLARITIN) 10 MG tablet Take 10 mg by mouth daily as needed for allergies.    LORazepam (ATIVAN) 0.5 MG tablet TAKE 1 TABLET BY MOUTH EVERY 8 HOURS AS NEEDED FOR ANXIETY.   metFORMIN (GLUCOPHAGE) 1000 MG tablet TAKE 1 TABLET BY MOUTH EVERY DAY WITH BREAKFAST   methocarbamol (ROBAXIN) 500 MG tablet Take 1 tablet by mouth 4 (four) times daily.   Multiple Vitamin (MULTIVITAMIN) tablet Take 1 tablet by mouth daily.   mupirocin ointment (BACTROBAN) 2 % Apply 1 application topically 2 (two) times daily.   nystatin cream (MYCOSTATIN) APPLY TO AFFECTED AREA TWICE A DAY   pantoprazole (PROTONIX) 40 MG tablet TAKE 1 TABLET BY MOUTH EVERY DAY   potassium chloride (KLOR-CON) 10 MEQ tablet Take 2 tablets by mouth daily.    potassium chloride SA (KLOR-CON M20) 20 MEQ tablet TAKE 2 TABLETS BY MOUTH EVERY MORNING   sacubitril-valsartan (ENTRESTO) 49-51 MG TAKE 1 TABLET BY MOUTH TWICE A DAY   sildenafil (VIAGRA) 100 MG tablet TAKE 1 TABLET DAILY AS NEEDED ED   solifenacin (VESICARE) 10 MG tablet Take 10 mg by mouth daily.   tadalafil (CIALIS) 10 MG tablet Take 0.5-1 tablets (5-10 mg total) by mouth daily as needed for erectile dysfunction.   tamsulosin (FLOMAX) 0.4 MG CAPS capsule TAKE 1 CAPSULE BY MOUTH EVERY DAY   Tetrahydrozoline HCl (VISINE OP) Place 1 drop into both eyes daily as needed (dry eyes).   traMADol (ULTRAM) 50 MG tablet Take 1 tablet (50 mg total) by mouth every 6 (six) hours as needed for moderate pain.   No facility-administered encounter medications on file as of 02/20/2021.   I called and spoke with the patient. He denies any concerns for his health or medications at this time. He rescheduled his appointment with clinical pharmacist for 02/27/2021 at 3:00 pm.  Future Appointments  Date Time Provider [place]  02/27/2021  3:00 PM [facility] CCM PHARMACIST [facility]    April D Calhoun, [place] Pharmacist Assistant [phone]

## 2021-02-21 ENCOUNTER — Telehealth: Payer: Medicare Other

## 2021-02-24 ENCOUNTER — Ambulatory Visit (INDEPENDENT_AMBULATORY_CARE_PROVIDER_SITE_OTHER): Payer: Medicare Other | Admitting: Physician Assistant

## 2021-02-24 ENCOUNTER — Other Ambulatory Visit: Payer: Self-pay

## 2021-02-24 ENCOUNTER — Encounter: Payer: Self-pay | Admitting: Physician Assistant

## 2021-02-24 VITALS — BP 103/71 | HR 94 | Temp 98.0°F | Ht 72.0 in | Wt 227.2 lb

## 2021-02-24 DIAGNOSIS — L719 Rosacea, unspecified: Secondary | ICD-10-CM | POA: Diagnosis not present

## 2021-02-24 MED ORDER — METRONIDAZOLE 0.75 % EX CREA: TOPICAL_CREAM | Freq: Two times a day (BID) | CUTANEOUS | 1 refills | Status: DC

## 2021-02-24 NOTE — Patient Instructions (Signed)
I think this is a skin condition called Rosacea. Please stop using your creams at home and only use the Metronidazole cream. Recheck in 3-4 weeks with Dr. Jerline Pain.

## 2021-02-24 NOTE — Progress Notes (Signed)
Subjective:    Patient ID: [name], male    DOB: 01/01/1941, 80 y.o.   MRN: [id number]  Chief Complaint  Patient presents with   Rash    Rash  Patient is in today for rash on face x 2 weeks. Started with a few spots on right temple area, now bilateral and spreading down to cheeks. Not itching or pain. He has tried cortisone cream & halobetasol cream without relief. Uses Dial soap, no new changes. No other symptoms.  Past Medical History:  Diagnosis Date   CAD (coronary artery disease)    a. Remote cath 2008 for chest pain showed 30% LAD with luminal irregularity and fairly heavy calcification in the mid LAD without critical stenosis, 30% OM1, 30% acute marginal.   Cancer (HCC)    skin cancer - basal   Cataract    both   Chronic combined systolic and diastolic CHF (congestive heart failure) (HCC)    Complication of anesthesia    Diabetes mellitus    Type II   ED (erectile dysfunction)    History of kidney stones    x1   Hypertension    NICM (nonischemic cardiomyopathy) ([place])    a. EF 40-45% by echo in 08/2017 - prior low risk stress test in 2017.   Osteoarthritis    Paroxysmal SVT (supraventricular tachycardia) (HCC)    PONV (postoperative nausea and vomiting) 1960   with Ether   PVC's (premature ventricular contractions)    PVD (peripheral vascular disease) (HCC)    s/p R BKA   Rheumatic fever    Rhinitis    S/P BKA (below knee amputation), right [facility])     Past Surgical History:  Procedure Laterality Date   AMPUTATION Right 02/29/2016   Procedure: Right Leg AMPUTATION BELOW KNEE with Wound Vac placement;  Surgeon: [name], MD;  Location: [place];  Service: Orthopedics;  Laterality: Right;   CHOLECYSTECTOMY  1983   colonoscopy  2006, 2010   EYE SURGERY Bilateral    cataract   INGUINAL HERNIA REPAIR Right 1960   LEFT HEART CATH AND CORONARY ANGIOGRAPHY N/A 06/03/2018   Procedure: LEFT HEART CATH AND CORONARY ANGIOGRAPHY;  Surgeon: Belva Crome, MD;   Location: [place] [facility];  Service: Cardiovascular;  Laterality: N/A;   STUMP REVISION Right 10/30/2017   Procedure: RIGHT BELOW KNEE AMPUTATION REVISION;  Surgeon: [name], MD;  Location: [facility];  Service: Orthopedics;  Laterality: Right;    Family History  Problem Relation Age of Onset   Stroke Father 49       Died age 61   Coronary artery disease Brother 53   Colon cancer Neg Hx    Colon polyps Neg Hx    Diabetes Neg Hx    Esophageal cancer Neg Hx    Kidney disease Neg Hx    Gallbladder disease Neg Hx     Social History   Tobacco Use   Smoking status: Former    Years: 20.00    Types: Cigarettes    Quit date: 07/26/1981    Years since quitting: 39.6   Smokeless tobacco: Never  Vaping Use   Vaping Use: Never used  Substance Use Topics   Alcohol use: Yes    Comment: occasional glass of wine   Drug use: No     Allergies  Allergen Reactions   Lisinopril Cough    Review of Systems  Skin:  Positive for rash.  REFER TO HPI FOR PERTINENT POSITIVES  AND NEGATIVES      Objective:     BP 103/71   Pulse 94   Temp 98 F (36.7 C)   Ht 6' (1.829 m)   Wt 227 lb 3.2 oz (103.1 kg)   SpO2 97%   BMI 30.81 kg/m   Wt Readings from Last 3 Encounters:  02/24/21 227 lb 3.2 oz (103.1 kg)  12/01/20 214 lb (97.1 kg)  10/11/20 227 lb 6.4 oz (103.1 kg)    BP Readings from Last 3 Encounters:  02/24/21 103/71  01/03/21 110/72  12/01/20 115/71     Physical Exam Vitals and nursing note reviewed.  Constitutional:      Appearance: Normal appearance. He is obese.  HENT:     Head:     Comments: Bilateral temples into cheeks there is erythema with telangiectasis and small papules present  Neurological:     Mental Status: He is alert.       Assessment & Plan:   Problem List Items Addressed This Visit   None Visit Diagnoses     Rosacea    -  Primary        Meds ordered this encounter  Medications   metroNIDAZOLE (METROCREAM) 0.75 % cream    Sig: Apply  topically 2 (two) times daily.    Dispense:  45 g    Refill:  1   1. Rosacea -I think this may be rosacea. Does not appear to be allergic or shingles. I do not see signs of infection. -Treat with metronidazole cream twice daily -Recheck with Dr. Jerline Pain, consider derm referral   This note was prepared with assistance of Dragon voice recognition software. Occasional wrong-word or sound-a-like substitutions may have occurred due to the inherent limitations of voice recognition software.  Bernis Schreur M Maiana Hennigan, PA-C

## 2021-02-27 ENCOUNTER — Ambulatory Visit (INDEPENDENT_AMBULATORY_CARE_PROVIDER_SITE_OTHER): Payer: Medicare Other | Admitting: Pharmacist

## 2021-02-27 ENCOUNTER — Other Ambulatory Visit (INDEPENDENT_AMBULATORY_CARE_PROVIDER_SITE_OTHER): Payer: Self-pay | Admitting: Family Medicine

## 2021-02-27 DIAGNOSIS — I5022 Chronic systolic (congestive) heart failure: Secondary | ICD-10-CM

## 2021-02-27 DIAGNOSIS — I10 Essential (primary) hypertension: Secondary | ICD-10-CM

## 2021-02-27 NOTE — Patient Instructions (Addendum)
Visit Information   Goals Addressed             This Visit's Progress    Track and Manage Fluids and Swelling-Heart Failure       Timeframe:  Long-Range Goal Priority:  High Start Date: 02/27/21                            Expected End Date: 08/28/21                      Follow Up Date 05/30/21    - watch for swelling in feet, ankles and legs every day - weigh myself daily    Why is this important?   It is important to check your weight daily and watch how much salt and liquids you have.  It will help you to manage your heart failure.    Notes:        Patient Care Plan: General Pharmacy (Adult)     Problem Identified: HTN, HF, GERD, Hyperglycemia,   Priority: High  Onset Date: 02/27/2021     Long-Range Goal: Patient-Specific Goal   Start Date: 02/27/2021  Expected End Date: 09/27/2021  This Visit's Progress: On track  Priority: High  Note:    Current Barriers:  Unable to independently afford treatment regimen  Pharmacist Clinical Goal(s):  Patient will maintain control of weight and BP as evidenced by monitoring  through collaboration with PharmD and provider.   Interventions: 1:1 collaboration with Vivi Barrack, MD regarding development and update of comprehensive plan of care as evidenced by provider attestation and co-signature Inter-disciplinary care team collaboration (see longitudinal plan of care) Comprehensive medication review performed; medication list updated in electronic medical record  Hypertension (BP goal <140/90) -Controlled -Current treatment: Carvedilol 25mg  bid Entresto 49-51mg  BID -Medications previously tried: none noted  -Current home readings: 136/95, 120/92, 130/74 124/87,  -Denies hypotensive/hypertensive symptoms -Educated on BP goals and benefits of medications for prevention of heart attack, stroke and kidney damage; Daily salt intake goal < 2300 mg; Importance of home blood pressure monitoring; Symptoms of hypotension and  importance of maintaining adequate hydration; -Counseled to monitor BP at home daily, document, and provide log at future appointments -Recommended to continue current medication  Heart Failure (Goal: manage symptoms and prevent exacerbations) -Controlled -Last ejection fraction: 45% -Current treatment: Carvedilol 25mg  bid Entresto 49-51mg  BID Furosemide 20mg  prn -Medications previously tried: none noted  -Current home BP/HR readings: see HTN  -Educated on Benefits of medications for managing symptoms and prolonging life Importance of weighing daily; if you gain more than 3 pounds in one day or 5 pounds in one week, contact providers He reports weight is stable, denies any swelling or SOB -Recommended to continue current medication -he does report some issues with copay on Entresto, he can still currently afford it, however he did request help if we can get it to him  Patient Goals/Self-Care Activities Patient will:  - take medications as prescribed collaborate with provider on medication access solutions  Follow Up Plan: The care management team will reach out to the patient again over the next 180 days.       Patient verbalizes understanding of instructions provided today and agrees to view in [place].  Telephone follow up appointment with pharmacy team member scheduled for: 6 months  Edythe Clarity, [place]

## 2021-02-27 NOTE — Progress Notes (Addendum)
Chronic Care Management Pharmacy Note  03/30/2021 Name:  [name] MRN:  [id number] DOB:  Apr 09, 1941  Summary: PharmD follow up, patient doing well overall.  He requests help with his Entresto copay.  Recommendations/Changes made from today's visit: Patient does have upcoming OV and he is due for is diabetic foot exam.  Plan: FU 6 months   Subjective: [name] is an 80 y.o. year old male who is a primary patient of Vivi Barrack, MD.  The CCM team was consulted for assistance with disease management and care coordination needs.    Engaged with patient by telephone for follow up visit in response to provider referral for pharmacy case management and/or care coordination services.   Consent to Services:  The patient was given the following information about Chronic Care Management services today, agreed to services, and gave verbal consent: 1. CCM service includes personalized support from designated clinical staff supervised by the primary care provider, including individualized plan of care and coordination with other care providers 2. 24/7 contact phone numbers for assistance for urgent and routine care needs. 3. Service will only be billed when office clinical staff spend 20 minutes or more in a month to coordinate care. 4. Only one practitioner may furnish and bill the service in a calendar month. 5.The patient may stop CCM services at any time (effective at the end of the month) by phone call to the office staff. 6. The patient will be responsible for cost sharing (co-pay) of up to 20% of the service fee (after annual deductible is met). Patient agreed to services and consent obtained.  Patient Care Team: Vivi Barrack, MD as PCP - General (Family Medicine) Debara Pickett Nadean Corwin, MD as PCP - Cardiology (Cardiology) Katy Apo, MD as Consulting Physician (Ophthalmology) Edythe Clarity, [facility] (Pharmacist)  Recent office visits:  02/24/21 ([place]) - possible rosacea -  treated with metronidazole cream BID 01/03/2021 OV (PCP) Dimas Chyle MD; chronic follow up, no medication changes indicated.   12/01/2020 OV (PCP) Dimas Chyle MD; chronic follow up, no medication changes indicated.   Recent consult visits:  None   Hospital visits:  None in previous 6 months   Objective:  Lab Results  Component Value Date   CREATININE 0.76 11/21/2020   BUN 14 11/21/2020   GFR 84.54 11/21/2020   GFRNONAA 86 07/10/2018   GFRAA 99 07/10/2018   NA 138 11/21/2020   K 4.8 11/21/2020   CALCIUM 9.0 11/21/2020   CO2 26 11/21/2020   GLUCOSE 94 11/21/2020    Lab Results  Component Value Date/Time   HGBA1C 5.4 03/11/2020 08:52 AM   HGBA1C 5.8 09/04/2019 09:18 AM   HGBA1C 5.9 (H) 10/30/2017 10:48 AM   GFR 84.54 11/21/2020 03:22 PM   GFR 81.66 07/07/2020 10:02 AM   MICROALBUR 0.7 09/11/2016 12:06 PM   MICROALBUR <0.7 07/12/2015 04:35 PM    Last diabetic Eye exam:  Lab Results  Component Value Date/Time   HMDIABEYEEXA No Retinopathy 08/18/2020 12:00 AM    Last diabetic Foot exam: No results found for: HMDIABFOOTEX   Lab Results  Component Value Date   CHOL 148 09/04/2019   HDL 36.00 (L) 09/04/2019   LDLCALC 93 09/04/2019   LDLDIRECT 84.0 09/10/2016   TRIG 94.0 09/04/2019   CHOLHDL 4 09/04/2019    Hepatic Function Latest Ref Rng & Units 11/21/2020 07/07/2020 09/04/2019  Total Protein 6.0 - 8.3 g/dL 6.4 6.6 6.4  Albumin 3.5 - 5.2 g/dL 4.1 4.1 4.3  AST 0 - 37 U/L '11 12 14  ' ALT 0 - 53 U/L '9 12 16  ' Alk Phosphatase 39 - 117 U/L 59 61 58  Total Bilirubin 0.2 - 1.2 mg/dL 0.7 0.7 0.8  Bilirubin, Direct 0.0 - 0.3 mg/dL - - -    Lab Results  Component Value Date/Time   TSH 1.37 07/07/2020 10:02 AM   TSH 1.92 09/04/2019 09:18 AM    CBC Latest Ref Rng & Units 11/21/2020 07/07/2020 09/04/2019  WBC 4.0 - 10.5 K/uL 6.5 4.9 6.7  Hemoglobin 13.0 - 17.0 g/dL 13.3 14.2 13.8  Hematocrit 39.0 - 52.0 % 39.7 41.3 40.2  Platelets 150.0 - 400.0 K/uL 245.0 206.0 204.0     No results found for: VD25OH  Clinical ASCVD: No  The ASCVD Risk score (Arnett DK, et al., 2019) failed to calculate for the following reasons:   The 2019 ASCVD risk score is only valid for ages 42 to 74    Depression screen PHQ 2/9 01/03/2021 12/01/2020 03/11/2020  Decreased Interest 1 1 0  Down, Depressed, Hopeless 1 1 0  PHQ - 2 Score 2 2 0  Altered sleeping 0 0 0  Tired, decreased energy 1 1 0  Change in appetite 0 0 0  Feeling bad or failure about yourself  0 1 0  Trouble concentrating 0 0 0  Moving slowly or fidgety/restless 0 0 0  Suicidal thoughts 0 0 0  PHQ-9 Score 3 4 0  Difficult doing work/chores Not difficult at all Somewhat difficult Not difficult at all  Some recent data might be hidden      Social History   Tobacco Use  Smoking Status Former   Years: 20.00   Types: Cigarettes   Quit date: 07/26/1981   Years since quitting: 39.7  Smokeless Tobacco Never   BP Readings from Last 3 Encounters:  03/24/21 123/86  02/24/21 103/71  01/03/21 110/72   Pulse Readings from Last 3 Encounters:  03/24/21 (!) 111  02/24/21 94  01/03/21 89   Wt Readings from Last 3 Encounters:  03/24/21 220 lb 6.4 oz (100 kg)  02/24/21 227 lb 3.2 oz (103.1 kg)  12/01/20 214 lb (97.1 kg)   BMI Readings from Last 3 Encounters:  03/24/21 29.89 kg/m  02/24/21 30.81 kg/m  01/03/21 29.02 kg/m    Assessment/Interventions: Review of patient past medical history, allergies, medications, health status, including review of consultants reports, laboratory and other test data, was performed as part of comprehensive evaluation and provision of chronic care management services.   SDOH:  (Social Determinants of Health) assessments and interventions performed: Yes  Financial Resource Strain: Not on file    SDOH Screenings   Alcohol Screen: Not on file  Depression (PHQ2-9): Low Risk    PHQ-2 Score: 3  Financial Resource Strain: Not on file  Food Insecurity: No Food Insecurity    Worried About Charity fundraiser in the Last Year: Never true   Ran Out of Food in the Last Year: Never true  Housing: Not on file  Physical Activity: Not on file  Social Connections: Not on file  Stress: Not on file  Tobacco Use: Medium Risk   Smoking Tobacco Use: Former   Smokeless Tobacco Use: Never   Passive Exposure: Not on file  Transportation Needs: No Transportation Needs   Lack of Transportation (Medical): No   Lack of Transportation (Non-Medical): No    CCM Care Plan  Allergies  Allergen Reactions   Lisinopril Cough  Medications Reviewed Today     Reviewed by Edythe Clarity, [facility] (Pharmacist) on 02/27/21 at 1541  Med List Status: <None>   Medication Order Taking? Sig Documenting Provider Last Dose Status Informant  ACCU-CHEK AVIVA PLUS test strip [id number] Yes USE TO TEST GLUCOSE ONCE DAILY E11.9 Vivi Barrack, MD Taking Active   Accu-Chek FastClix Lancets MISC [id number] Yes USE TO CHECK BLOOD SUGAR DAILY AND AS NEEDED Vivi Barrack, MD Taking Active   aspirin 325 MG tablet [id number] Yes Take 325 mg by mouth daily. [provider] Taking Active   aspirin-sod bicarb-citric acid (ALKA-SELTZER) 325 MG TBEF tablet [id number] Yes Take 650 mg by mouth daily as needed (heart burn). [provider] Taking Active Self  carvedilol (COREG) 25 MG tablet [id number] Yes TAKE 1 TABLET BY MOUTH TWICE A DAY Hilty, Nadean Corwin, MD Taking Active   doxycycline (VIBRA-TABS) 100 MG tablet [id number] Yes Take 1 tablet (100 mg total) by mouth 2 (two) times daily. Vivi Barrack, MD Taking Active   furosemide (LASIX) 20 MG tablet [id number] Yes TAKE 1 TABLET (20 MG TOTAL) BY MOUTH DAILY AS NEEDED FOR EDEMA. Vivi Barrack, MD Taking Active   gabapentin (NEURONTIN) 100 MG capsule [id number] Yes TAKE 1 CAPSULE BY MOUTH THREE TIMES A DAY Vivi Barrack, MD Taking Active   halobetasol (ULTRAVATE) 0.05 % cream [id number] Yes APPLY TO AFFECTED AREA TWICE A DAY Vivi Barrack, MD  Taking Active   HYDROcodone-acetaminophen (NORCO/VICODIN) 5-325 MG tablet [id number] Yes Take 1 tablet by mouth every 6 (six) hours. [provider] Taking Active   hydrocortisone cream 1 % [id number] Yes Apply 1 application topically daily as needed for itching. [provider] Taking Active Self  loratadine (CLARITIN) 10 MG tablet [id number] Yes Take 10 mg by mouth daily as needed for allergies.  [provider] Taking Active Self  LORazepam (ATIVAN) 0.5 MG tablet [id number] Yes TAKE 1 TABLET BY MOUTH EVERY 8 HOURS AS NEEDED FOR ANXIETY. Vivi Barrack, MD Taking Active   metFORMIN (GLUCOPHAGE) 1000 MG tablet [id number] Yes TAKE 1 TABLET BY MOUTH EVERY DAY WITH BREAKFAST Vivi Barrack, MD Taking Active   methocarbamol (ROBAXIN) 500 MG tablet [id number] Yes Take 1 tablet by mouth 4 (four) times daily. [provider] Taking Active   metroNIDAZOLE (METROCREAM) 0.75 % cream [id number] Yes Apply topically 2 (two) times daily. [place], Randa Evens, PA-C Taking Active   Multiple Vitamin (MULTIVITAMIN) tablet [id number] Yes Take 1 tablet by mouth daily. [provider] Taking Active Self  mupirocin ointment (BACTROBAN) 2 % [id number] Yes Apply 1 application topically 2 (two) times daily. Trula Slade, DPM Taking Active   nystatin cream (MYCOSTATIN) [id number] Yes APPLY TO AFFECTED AREA TWICE A DAY Vivi Barrack, MD Taking Active   pantoprazole (PROTONIX) 40 MG tablet [id number] Yes TAKE 1 TABLET BY MOUTH EVERY DAY Vivi Barrack, MD Taking Active   potassium chloride (KLOR-CON) 10 MEQ tablet [id number] Yes Take 2 tablets by mouth daily. [provider] Taking Active   potassium chloride SA (KLOR-CON M20) 20 MEQ tablet [id number] Yes TAKE 2 TABLETS BY MOUTH EVERY MORNING Vivi Barrack, MD Taking Active   sacubitril-valsartan [facility]) 49-51 MG [id number] Yes TAKE 1 TABLET BY MOUTH TWICE A DAY Hilty, Nadean Corwin, MD Taking Active   sildenafil (VIAGRA) 100 MG  tablet [id number] Yes TAKE 1 TABLET DAILY AS NEEDED ED Vivi Barrack, MD Taking Active   solifenacin (VESICARE) 10 MG tablet [id number] Yes Take  10 mg by mouth daily. [provider] Taking Active   tadalafil (CIALIS) 10 MG tablet [id number] Yes Take 0.5-1 tablets (5-10 mg total) by mouth daily as needed for erectile dysfunction. Vivi Barrack, MD Taking Active   tamsulosin [facility]) 0.4 MG CAPS capsule [id number] Yes TAKE 1 CAPSULE BY MOUTH EVERY DAY Hilty, Nadean Corwin, MD Taking Active   Tetrahydrozoline HCl (VISINE OP) [id number] Yes Place 1 drop into both eyes daily as needed (dry eyes). [provider] Taking Active Self  traMADol (ULTRAM) 50 MG tablet [id number] Yes Take 1 tablet (50 mg total) by mouth every 6 (six) hours as needed for moderate pain. Vivi Barrack, MD Taking Active             Patient Active Problem List   Diagnosis Date Noted   Gastroesophageal reflux disease 12/08/2019   Dermatitis 03/10/2018   Benign prostatic hyperplasia with nocturia 11/12/2017   Nonischemic cardiomyopathy ([place]) 09/03/2017   AVNRT (AV nodal re-entry tachycardia) ([place]) 01/30/2017   Chronic midline low back pain without sciatica 01/03/2017   H/O amputation of leg through tibia and fibula, right ([place]) 05/03/2016   Phantom limb pain ([place]) 03/06/2016   SVT (supraventricular tachycardia) ([place]) 32/04/2481   Chronic systolic heart failure ([place]) 01/20/2016   Diabetic peripheral neuropathy ([place]) 08/23/2015   Esophageal dysphagia 08/06/2014   Anxiety state 10/26/2013   Hyperglycemia 11/02/2010   BASAL CELL CARCINOMA, FACE 10/18/2008   LIPOMA, SKIN 05/31/2008   ERECTILE DYSFUNCTION 09/30/2007   OSTEOARTHRITIS 08/08/2007   Essential hypertension 03/24/2007    Immunization History  Administered Date(s) Administered   Fluad Quad(high Dose 65+) 01/27/2019, 03/11/2020   Influenza Split 04/10/2011   Influenza Whole 02/25/2009   Influenza, High Dose Seasonal PF 05/28/2016, 03/20/2017,  04/27/2017   Influenza-Unspecified 02/25/2013, 02/26/2015   Pneumococcal Polysaccharide-23 05/28/2005   Td 05/28/2006, 09/10/2016   Varicella 04/08/2012    Conditions to be addressed/monitored:  HTN, HF, GERD, Hyperglycemia,   Care Plan : General Pharmacy (Adult)  Updates made by Edythe Clarity, RPH since 03/30/2021 12:00 AM     Problem: HTN, HF, GERD, Hyperglycemia,   Priority: High  Onset Date: 02/27/2021     Long-Range Goal: Patient-Specific Goal   Start Date: 02/27/2021  Expected End Date: 09/27/2021  This Visit's Progress: On track  Priority: High  Note:    Current Barriers:  Unable to independently afford treatment regimen  Pharmacist Clinical Goal(s):  Patient will maintain control of weight and BP as evidenced by monitoring  through collaboration with PharmD and provider.   Interventions: 1:1 collaboration with Vivi Barrack, MD regarding development and update of comprehensive plan of care as evidenced by provider attestation and co-signature Inter-disciplinary care team collaboration (see longitudinal plan of care) Comprehensive medication review performed; medication list updated in electronic medical record  Hypertension (BP goal <140/90) -Controlled -Current treatment: Carvedilol 4m bid Entresto 49-574mBID -Medications previously tried: none noted  -Current home readings: 136/95, 120/92, 130/74 124/87,  -Denies hypotensive/hypertensive symptoms -Educated on BP goals and benefits of medications for prevention of heart attack, stroke and kidney damage; Daily salt intake goal < 2300 mg; Importance of home blood pressure monitoring; Symptoms of hypotension and importance of maintaining adequate hydration; -Counseled to monitor BP at home daily, document, and provide log at future appointments -Recommended to continue current medication  Heart Failure (Goal: manage symptoms and prevent exacerbations) -Controlled -Last ejection fraction: 45% -Current  treatment: Carvedilol 259mid Entresto 49-14m64mD Furosemide 20mg80m -Medications previously tried: none  noted  -Current home BP/HR readings: see HTN  -Educated on Benefits of medications for managing symptoms and prolonging life Importance of weighing daily; if you gain more than 3 pounds in one day or 5 pounds in one week, contact providers He reports weight is stable, denies any swelling or SOB -Recommended to continue current medication -he does report some issues with copay on Entresto, he can still currently afford it, however he did request help if we can get it to him  Patient Goals/Self-Care Activities Patient will:  - take medications as prescribed collaborate with provider on medication access solutions  Follow Up Plan: The care management team will reach out to the patient again over the next 180 days.       Medication Assistance: None required.  Patient affirms current coverage meets needs.  Compliance/Adherence/Medication fill history: Care Gaps: Due for foot exam  Star-Rating Drugs: Metformin 1091m 12/07/20 90ds  [facility] [place] [address] [place] [address]Phone: [phone]: [address]Phone: [phone]: [phone] Uses pill box? Yes Pt endorses 100% compliance  We discussed: Benefits of medication synchronization, packaging and delivery as well as enhanced pharmacist oversight with [employer]. Patient decided to: Continue current medication management strategy  Care Plan and Follow Up Patient Decision:  Patient agrees to Care Plan and Follow-up.  Plan: The care management team will reach out to the patient again over the next 180 days.  CBeverly Milch PharmD Clinical Pharmacist ([phone]

## 2021-02-28 ENCOUNTER — Other Ambulatory Visit: Payer: Self-pay | Admitting: Family Medicine

## 2021-02-28 ENCOUNTER — Other Ambulatory Visit: Payer: Self-pay | Admitting: Internal Medicine

## 2021-03-03 ENCOUNTER — Other Ambulatory Visit: Payer: Self-pay | Admitting: Family Medicine

## 2021-03-03 DIAGNOSIS — L309 Dermatitis, unspecified: Secondary | ICD-10-CM

## 2021-03-24 ENCOUNTER — Ambulatory Visit (INDEPENDENT_AMBULATORY_CARE_PROVIDER_SITE_OTHER): Payer: Medicare Other | Admitting: Family Medicine

## 2021-03-24 ENCOUNTER — Other Ambulatory Visit: Payer: Self-pay

## 2021-03-24 VITALS — BP 123/86 | HR 111 | Temp 98.3°F | Ht 72.0 in | Wt 220.4 lb

## 2021-03-24 DIAGNOSIS — I10 Essential (primary) hypertension: Secondary | ICD-10-CM | POA: Diagnosis not present

## 2021-03-24 DIAGNOSIS — L309 Dermatitis, unspecified: Secondary | ICD-10-CM

## 2021-03-24 MED ORDER — DOXYCYCLINE HYCLATE 100 MG PO TABS: 100.0000 mg | ORAL_TABLET | Freq: Two times a day (BID) | ORAL | 0 refills | Status: DC

## 2021-03-24 MED ORDER — NYSTATIN 100000 UNIT/GM EX CREA: TOPICAL_CREAM | CUTANEOUS | 2 refills | Status: DC

## 2021-03-24 NOTE — Assessment & Plan Note (Signed)
At goal.  Continue Coreg 25 mg twice daily and Entresto 49-51 twice daily.

## 2021-03-24 NOTE — Assessment & Plan Note (Signed)
Continue halobetasol and nystatin as needed.  He has had improvement over the last several weeks with this regimen.

## 2021-03-24 NOTE — Progress Notes (Signed)
   [name] is a 80 y.o. male who presents today for an office visit.  Assessment/Plan:  New/Acute Problems: Cellulitis No signs of systemic illness.  Open wound seems to be very superficial doubt osteomyelitis or deep tissue infection.  We will start course of doxycycline today.  He will let me know if not improving in the next 1 to 2 weeks.  May consider referral to wound care clinic if this does not heal as expected.  Chronic Problems Addressed Today: Essential hypertension At goal.  Continue Coreg 25 mg twice daily and Entresto 49-51 twice daily.  Dermatitis Continue halobetasol and nystatin as needed.  He has had improvement over the last several weeks with this regimen.     Subjective:  HPI:  See A/P for status of chronic conditions  He has noticed some redness and an open wound on his right stump.  Some pain to the area.  No discharge.  No fevers or chills.  Had something similar a few months ago that resolved with doxycycline.       Objective:  Physical Exam: BP 123/86   Pulse (!) 111   Temp 98.3 F (36.8 C) (Temporal)   Ht 6' (1.829 m)   Wt 220 lb 6.4 oz (100 kg)   SpO2 100%   BMI 29.89 kg/m   Gen: No acute distress, resting comfortably CV: Regular rate and rhythm with no murmurs appreciated Pulm: Normal work of breathing, clear to auscultation bilaterally with no crackles, wheezes, or rhonchi MSK: Approximately 1 cm open wound on right BKA stump.  Small amount of surrounding erythema.  No purulent discharge. Neuro: Grossly normal, moves all extremities Psych: Normal affect and thought content      Kathalina Ostermann M. Jerline Pain, MD 03/24/2021 2:17 PM

## 2021-03-24 NOTE — Patient Instructions (Signed)
It was very nice to see you today!  Please start the doxycycline.  Send a message in a week or so and let me know how the area on your leg is doing.  We will refill your nystatin today.  Take care, Dr Jerline Pain  PLEASE NOTE:  If you had any lab tests please let us know if you have not heard back within a few days. You may see your results on mychart before we have a chance to review them but we will give you a call once they are reviewed by [place]. If we ordered any referrals today, please let us know if you have not heard from their office within the next week.   Please try these tips to maintain a healthy lifestyle:  Eat at least 3 REAL meals and 1-2 snacks per day.  Aim for no more than 5 hours between eating.  If you eat breakfast, please do so within one hour of getting up.   Each meal should contain half fruits/vegetables, one quarter protein, and one quarter carbs (no bigger than a computer mouse)  Cut down on sweet beverages. This includes juice, soda, and sweet tea.   Drink at least 1 glass of water with each meal and aim for at least 8 glasses per day  Exercise at least 150 minutes every week.

## 2021-03-27 DIAGNOSIS — I5022 Chronic systolic (congestive) heart failure: Secondary | ICD-10-CM | POA: Diagnosis not present

## 2021-03-27 DIAGNOSIS — I10 Essential (primary) hypertension: Secondary | ICD-10-CM | POA: Diagnosis not present

## 2021-04-18 ENCOUNTER — Other Ambulatory Visit: Payer: Self-pay | Admitting: *Deleted

## 2021-04-18 MED ORDER — TADALAFIL 10 MG PO TABS: 5.0000 mg | ORAL_TABLET | Freq: Every day | ORAL | 5 refills | Status: DC | PRN

## 2021-04-18 NOTE — Telephone Encounter (Signed)
[facility]  Last refill on 01/27/2019

## 2021-05-11 ENCOUNTER — Other Ambulatory Visit: Payer: Self-pay

## 2021-05-11 ENCOUNTER — Encounter (HOSPITAL_BASED_OUTPATIENT_CLINIC_OR_DEPARTMENT_OTHER): Payer: Self-pay

## 2021-05-11 ENCOUNTER — Other Ambulatory Visit (HOSPITAL_COMMUNITY): Payer: Self-pay

## 2021-05-11 ENCOUNTER — Observation Stay (HOSPITAL_BASED_OUTPATIENT_CLINIC_OR_DEPARTMENT_OTHER)
Admission: EM | Admit: 2021-05-11 | Discharge: 2021-05-12 | Disposition: A | Discharge status: Home / Self Care | Payer: Medicare Other | Attending: Internal Medicine | Admitting: Internal Medicine

## 2021-05-11 ENCOUNTER — Observation Stay (HOSPITAL_COMMUNITY): Payer: Medicare Other

## 2021-05-11 DIAGNOSIS — Z7984 Long term (current) use of oral hypoglycemic drugs: Secondary | ICD-10-CM | POA: Diagnosis not present

## 2021-05-11 DIAGNOSIS — I2510 Atherosclerotic heart disease of native coronary artery without angina pectoris: Secondary | ICD-10-CM | POA: Insufficient documentation

## 2021-05-11 DIAGNOSIS — Z20822 Contact with and (suspected) exposure to covid-19: Secondary | ICD-10-CM | POA: Diagnosis not present

## 2021-05-11 DIAGNOSIS — I5022 Chronic systolic (congestive) heart failure: Secondary | ICD-10-CM

## 2021-05-11 DIAGNOSIS — I110 Hypertensive heart disease with heart failure: Secondary | ICD-10-CM | POA: Diagnosis not present

## 2021-05-11 DIAGNOSIS — I5042 Chronic combined systolic (congestive) and diastolic (congestive) heart failure: Secondary | ICD-10-CM | POA: Diagnosis not present

## 2021-05-11 DIAGNOSIS — L97522 Non-pressure chronic ulcer of other part of left foot with fat layer exposed: Secondary | ICD-10-CM | POA: Diagnosis not present

## 2021-05-11 DIAGNOSIS — I4891 Unspecified atrial fibrillation: Principal | ICD-10-CM | POA: Diagnosis present

## 2021-05-11 DIAGNOSIS — I509 Heart failure, unspecified: Secondary | ICD-10-CM

## 2021-05-11 DIAGNOSIS — Z79899 Other long term (current) drug therapy: Secondary | ICD-10-CM | POA: Diagnosis not present

## 2021-05-11 DIAGNOSIS — E119 Type 2 diabetes mellitus without complications: Secondary | ICD-10-CM | POA: Insufficient documentation

## 2021-05-11 DIAGNOSIS — Z7982 Long term (current) use of aspirin: Secondary | ICD-10-CM | POA: Diagnosis not present

## 2021-05-11 DIAGNOSIS — Z87891 Personal history of nicotine dependence: Secondary | ICD-10-CM | POA: Diagnosis not present

## 2021-05-11 DIAGNOSIS — I4892 Unspecified atrial flutter: Secondary | ICD-10-CM

## 2021-05-11 DIAGNOSIS — R918 Other nonspecific abnormal finding of lung field: Secondary | ICD-10-CM | POA: Diagnosis not present

## 2021-05-11 DIAGNOSIS — Z85828 Personal history of other malignant neoplasm of skin: Secondary | ICD-10-CM | POA: Diagnosis not present

## 2021-05-11 LAB — BASIC METABOLIC PANEL
Anion gap: 10 (ref 5–15)
BUN: 20 mg/dL (ref 8–23)
CO2: 23 mmol/L (ref 22–32)
Calcium: 8.6 mg/dL — ABNORMAL LOW (ref 8.9–10.3)
Chloride: 104 mmol/L (ref 98–111)
Creatinine, Ser: 0.83 mg/dL (ref 0.61–1.24)
GFR, Estimated: >60 mL/min (ref >60)
Glucose, Bld: 124 mg/dL — ABNORMAL HIGH (ref 70–99)
Potassium: 4.1 mmol/L (ref 3.5–5.1)
Sodium: 137 mmol/L (ref 135–145)

## 2021-05-11 LAB — HEMOGLOBIN A1C
Hgb A1c MFr Bld: 5.9 % — ABNORMAL HIGH (ref 4.8–5.6)
Mean Plasma Glucose: 122.63 mg/dL

## 2021-05-11 LAB — TSH: TSH: 1.958 u[IU]/mL (ref 0.350–4.500)

## 2021-05-11 LAB — CBC WITH DIFFERENTIAL/PLATELET
Abs Immature Granulocytes: 0.01 10*3/uL (ref 0.00–0.07)
Basophils Absolute: 0.1 10*3/uL (ref 0.0–0.1)
Basophils Relative: 2 %
Eosinophils Absolute: 0.2 10*3/uL (ref 0.0–0.5)
Eosinophils Relative: 4 %
HCT: 43.4 % (ref 39.0–52.0)
Hemoglobin: 14.8 g/dL (ref 13.0–17.0)
Immature Granulocytes: 0 %
Lymphocytes Relative: 21 %
Lymphs Abs: 1.1 10*3/uL (ref 0.7–4.0)
MCH: 29.9 pg (ref 26.0–34.0)
MCHC: 34.1 g/dL (ref 30.0–36.0)
MCV: 87.7 fL (ref 80.0–100.0)
Monocytes Absolute: 0.6 10*3/uL (ref 0.1–1.0)
Monocytes Relative: 11 %
Neutro Abs: 3.4 10*3/uL (ref 1.7–7.7)
Neutrophils Relative %: 62 %
Platelets: 176 10*3/uL (ref 150–400)
RBC: 4.95 MIL/uL (ref 4.22–5.81)
RDW: 13.6 % (ref 11.5–15.5)
WBC: 5.4 10*3/uL (ref 4.0–10.5)
nRBC: 0 % (ref 0.0–0.2)

## 2021-05-11 LAB — GLUCOSE, CAPILLARY
Glucose-Capillary: 102 mg/dL — ABNORMAL HIGH (ref 70–99)
Glucose-Capillary: 113 mg/dL — ABNORMAL HIGH (ref 70–99)

## 2021-05-11 LAB — RESP PANEL BY RT-PCR (FLU A&B, COVID) ARPGX2
Influenza A by PCR: NEGATIVE
Influenza B by PCR: NEGATIVE
SARS Coronavirus 2 by RT PCR: NEGATIVE

## 2021-05-11 LAB — CBG MONITORING, ED: Glucose-Capillary: 122 mg/dL — ABNORMAL HIGH (ref 70–99)

## 2021-05-11 LAB — TROPONIN I (HIGH SENSITIVITY): Troponin I (High Sensitivity): 4 ng/L (ref <18)

## 2021-05-11 MED ORDER — SACUBITRIL-VALSARTAN 49-51 MG PO TABS
1.0000 | ORAL_TABLET | Freq: Two times a day (BID) | ORAL | Status: DC
  Administered 2021-05-11 (×2): 1 via ORAL
  Filled 2021-05-11 (×2): qty 1

## 2021-05-11 MED ORDER — ASPIRIN EC 81 MG PO TBEC: 81.0000 mg | DELAYED_RELEASE_TABLET | Freq: Every day | ORAL | Status: DC

## 2021-05-11 MED ORDER — ADENOSINE 6 MG/2ML IV SOLN
6.0000 mg | Freq: Once | INTRAVENOUS | Status: DC
  Filled 2021-05-11: qty 2

## 2021-05-11 MED ORDER — SACUBITRIL-VALSARTAN 49-51 MG PO TABS: 1.0000 | ORAL_TABLET | Freq: Two times a day (BID) | ORAL | Status: DC

## 2021-05-11 MED ORDER — TAMSULOSIN HCL 0.4 MG PO CAPS
0.4000 mg | ORAL_CAPSULE | Freq: Every day | ORAL | Status: DC
  Administered 2021-05-11 – 2021-05-12 (×2): 0.4 mg via ORAL
  Filled 2021-05-11 (×2): qty 1

## 2021-05-11 MED ORDER — APIXABAN 5 MG PO TABS
5.0000 mg | ORAL_TABLET | Freq: Two times a day (BID) | ORAL | Status: DC
  Administered 2021-05-11 – 2021-05-12 (×3): 5 mg via ORAL
  Filled 2021-05-11 (×3): qty 1

## 2021-05-11 MED ORDER — POTASSIUM CHLORIDE CRYS ER 20 MEQ PO TBCR
40.0000 meq | EXTENDED_RELEASE_TABLET | Freq: Every day | ORAL | Status: DC
  Administered 2021-05-12: 40 meq via ORAL
  Filled 2021-05-11: qty 2

## 2021-05-11 MED ORDER — LORAZEPAM 0.5 MG PO TABS: 0.5000 mg | ORAL_TABLET | Freq: Three times a day (TID) | ORAL | Status: DC | PRN

## 2021-05-11 MED ORDER — CEFAZOLIN SODIUM-DEXTROSE 1-4 GM/50ML-% IV SOLN
1.0000 g | Freq: Once | INTRAVENOUS | Status: AC
  Administered 2021-05-11: 1 g via INTRAVENOUS
  Filled 2021-05-11: qty 50

## 2021-05-11 MED ORDER — CARVEDILOL 25 MG PO TABS
25.0000 mg | ORAL_TABLET | Freq: Two times a day (BID) | ORAL | Status: DC
  Administered 2021-05-11 – 2021-05-12 (×2): 25 mg via ORAL
  Filled 2021-05-11 (×2): qty 1

## 2021-05-11 MED ORDER — SACUBITRIL-VALSARTAN 49-51 MG PO TABS
1.0000 | ORAL_TABLET | Freq: Two times a day (BID) | ORAL | Status: DC
  Filled 2021-05-11: qty 1

## 2021-05-11 MED ORDER — DARIFENACIN HYDROBROMIDE ER 7.5 MG PO TB24
7.5000 mg | ORAL_TABLET | Freq: Every day | ORAL | Status: DC
  Administered 2021-05-11 – 2021-05-12 (×2): 7.5 mg via ORAL
  Filled 2021-05-11 (×3): qty 1

## 2021-05-11 MED ORDER — SODIUM CHLORIDE 0.9% FLUSH
3.0000 mL | Freq: Two times a day (BID) | INTRAVENOUS | Status: DC
  Administered 2021-05-11 – 2021-05-12 (×3): 3 mL via INTRAVENOUS

## 2021-05-11 MED ORDER — GABAPENTIN 100 MG PO CAPS
100.0000 mg | ORAL_CAPSULE | Freq: Three times a day (TID) | ORAL | Status: DC
  Administered 2021-05-11 – 2021-05-12 (×3): 100 mg via ORAL
  Filled 2021-05-11 (×3): qty 1

## 2021-05-11 MED ORDER — DILTIAZEM HCL-DEXTROSE 125-5 MG/125ML-% IV SOLN (PREMIX)
5.0000 mg/h | INTRAVENOUS | Status: DC
  Administered 2021-05-11: 5 mg/h via INTRAVENOUS

## 2021-05-11 MED ORDER — DILTIAZEM LOAD VIA INFUSION
20.0000 mg | Freq: Once | INTRAVENOUS | Status: AC
  Administered 2021-05-11: 20 mg via INTRAVENOUS
  Filled 2021-05-11: qty 20

## 2021-05-11 MED ORDER — METOPROLOL TARTRATE 25 MG PO TABS: 25.0000 mg | ORAL_TABLET | Freq: Two times a day (BID) | ORAL | Status: DC

## 2021-05-11 MED ORDER — SODIUM CHLORIDE 0.9% FLUSH: 3.0000 mL | INTRAVENOUS | Status: DC | PRN

## 2021-05-11 MED ORDER — ASPIRIN 325 MG PO TABS
325.0000 mg | ORAL_TABLET | Freq: Every day | ORAL | Status: DC
  Filled 2021-05-11: qty 1

## 2021-05-11 MED ORDER — PANTOPRAZOLE SODIUM 40 MG PO TBEC
40.0000 mg | DELAYED_RELEASE_TABLET | Freq: Every day | ORAL | Status: DC
  Administered 2021-05-11 – 2021-05-12 (×2): 40 mg via ORAL
  Filled 2021-05-11 (×2): qty 1

## 2021-05-11 MED ORDER — METFORMIN HCL 500 MG PO TABS
1000.0000 mg | ORAL_TABLET | Freq: Every day | ORAL | Status: DC
  Administered 2021-05-12: 1000 mg via ORAL
  Filled 2021-05-11: qty 2

## 2021-05-11 MED ORDER — INSULIN ASPART 100 UNIT/ML IJ SOLN
0.0000 [IU] | Freq: Three times a day (TID) | INTRAMUSCULAR | Status: DC
  Administered 2021-05-12: 2 [IU] via SUBCUTANEOUS

## 2021-05-11 MED ORDER — METFORMIN HCL 500 MG PO TABS
1000.0000 mg | ORAL_TABLET | Freq: Two times a day (BID) | ORAL | Status: DC
  Administered 2021-05-11: 1000 mg via ORAL
  Filled 2021-05-11: qty 2

## 2021-05-11 MED ORDER — SACUBITRIL-VALSARTAN 24-26 MG PO TABS
1.0000 | ORAL_TABLET | Freq: Two times a day (BID) | ORAL | Status: DC
  Administered 2021-05-12: 1 via ORAL
  Filled 2021-05-11: qty 1

## 2021-05-11 MED ORDER — "THROMBI-PAD 3""X3"" EX PADS"
1.0000 | MEDICATED_PAD | Freq: Once | CUTANEOUS | Status: AC
  Administered 2021-05-11: 1 via TOPICAL
  Filled 2021-05-11: qty 1

## 2021-05-11 MED ORDER — SODIUM CHLORIDE 0.9 % IV SOLN: 250.0000 mL | INTRAVENOUS | Status: DC | PRN

## 2021-05-11 MED ORDER — SACUBITRIL-VALSARTAN 24-26 MG PO TABS: 1.0000 | ORAL_TABLET | Freq: Two times a day (BID) | ORAL | Status: DC

## 2021-05-11 MED ORDER — METOPROLOL TARTRATE 5 MG/5ML IV SOLN
2.5000 mg | Freq: Four times a day (QID) | INTRAVENOUS | Status: DC | PRN
  Administered 2021-05-12: 2.5 mg via INTRAVENOUS
  Filled 2021-05-11 (×2): qty 5

## 2021-05-11 MED ORDER — ACETAMINOPHEN 325 MG PO TABS: 650.0000 mg | ORAL_TABLET | ORAL | Status: DC | PRN

## 2021-05-11 MED ORDER — DILTIAZEM HCL-DEXTROSE 125-5 MG/125ML-% IV SOLN (PREMIX)
INTRAVENOUS | Status: AC
  Filled 2021-05-11: qty 125

## 2021-05-11 MED ORDER — ONDANSETRON HCL 4 MG/2ML IJ SOLN: 4.0000 mg | Freq: Four times a day (QID) | INTRAMUSCULAR | Status: DC | PRN

## 2021-05-11 NOTE — ED Notes (Signed)
Per Dr. Betsey Holiday, pt may eat and drink.

## 2021-05-11 NOTE — Consult Note (Addendum)
Cardiology Consultation:   Patient ID: CLAUDELL WOHLER MRN: [id number]; DOB: 1940-09-16  Admit date: 05/11/2021 Date of Consult: 05/11/2021  PCP:  Vivi Barrack, MD   [facility] Providers Cardiologist:  Pixie Casino, MD        Patient Profile:   ZIAH LEANDRO is a 80 y.o. male with a hx of nonischemic cardiomyopathy, chronic systolic CHF, right BKA, CAD, hypertension, hyperlipidemia and DM2.  Patient was initially found to have low EF of 45 to 50% in 2017 who is being seen 05/11/2021 for the evaluation of atrial flutter with RVR at the request of Dr. Roosevelt Locks.  History of Present Illness:   Mr. Sweetin is a 80 year old male with past medical history of nonischemic cardiomyopathy, chronic systolic CHF, right BKA, CAD, hypertension, hyperlipidemia and DM2.  Patient was initially found to have low EF of 45 to 50% in 2017.  Myoview at the time was negative for ischemia.  Due to declining ejection fraction, he ultimately underwent a cardiac catheterization on 06/03/2018 that showed 70% ostial small OM1 lesion, 40% D1 lesion, 50% mid LAD lesion, 40% mid RCA lesion, 60% proximal PDA lesion and a 50% left ventricular branch of RCA lesion.  No severe coronary artery disease to explain the drop in the ejection fraction.  He has been treated with carvedilol and Entresto.  He is unable to tolerate the highest dose of Entresto due to frequent urination.  Dr. Debara Pickett suspected patient may have PVC induced cardiomyopathy and recommended a heart monitor, however the patient turned it down.  Most recent echocardiogram obtained on 11/14/2020 showed EF 45 to 50%, global hypokinesis, normal pulmonary artery systolic pressure, moderate biatrial enlargement, moderately dilated ascending aorta measuring at 45 mm.  Patient was picking at a callus on his left foot when it began to bleed.  He sought medical attention in the ED due to the bleeding however found to be in atrial flutter with RVR of unknown duration.  Since he  has no palpitation, chest pain, dizziness, shortness of breath or fatigue, we do not know when atrial flutter actually started.  He was placed on Cardizem drip and was admitted to internal medicine service.  He was started on Eliquis 5 mg twice a day.  Home carvedilol 25 mg twice a day has been restarted.  IV diltiazem was later discontinued due to history of LV dysfunction.  Cardiology service has been consulted for atrial flutter with RVR.   Past Medical History:  Diagnosis Date   CAD (coronary artery disease)    a. Remote cath 2008 for chest pain showed 30% LAD with luminal irregularity and fairly heavy calcification in the mid LAD without critical stenosis, 30% OM1, 30% acute marginal.   Cancer (HCC)    skin cancer - basal   Cataract    both   Chronic combined systolic and diastolic CHF (congestive heart failure) (HCC)    Complication of anesthesia    Diabetes mellitus    Type II   ED (erectile dysfunction)    History of kidney stones    x1   Hypertension    NICM (nonischemic cardiomyopathy) ([place])    a. EF 40-45% by echo in 08/2017 - prior low risk stress test in 2017.   Osteoarthritis    Paroxysmal SVT (supraventricular tachycardia) (HCC)    PONV (postoperative nausea and vomiting) 1960   with Ether   PVC's (premature ventricular contractions)    PVD (peripheral vascular disease) (HCC)    s/p R BKA  Rheumatic fever    Rhinitis    S/P BKA (below knee amputation), right [facility])     Past Surgical History:  Procedure Laterality Date   AMPUTATION Right 02/29/2016   Procedure: Right Leg AMPUTATION BELOW KNEE with Wound Vac placement;  Surgeon: [name], MD;  Location: [place];  Service: Orthopedics;  Laterality: Right;   CHOLECYSTECTOMY  1983   colonoscopy  2006, 2010   EYE SURGERY Bilateral    cataract   INGUINAL HERNIA REPAIR Right 1960   LEFT HEART CATH AND CORONARY ANGIOGRAPHY N/A 06/03/2018   Procedure: LEFT HEART CATH AND CORONARY ANGIOGRAPHY;  Surgeon: Belva Crome,  MD;  Location: [place] [facility];  Service: Cardiovascular;  Laterality: N/A;   STUMP REVISION Right 10/30/2017   Procedure: RIGHT BELOW KNEE AMPUTATION REVISION;  Surgeon: [name], MD;  Location: [place];  Service: Orthopedics;  Laterality: Right;     Home Medications:  Prior to Admission medications   Medication Sig Start Date End Date Taking? Authorizing Provider  aspirin 325 MG tablet Take 325 mg by mouth daily.   Yes [provider]  aspirin-sod bicarb-citric acid (ALKA-SELTZER) 325 MG TBEF tablet Take 650 mg by mouth daily as needed (heart burn).   Yes [provider]  carvedilol (COREG) 25 MG tablet TAKE 1 TABLET BY MOUTH TWICE A DAY Patient taking differently: Take 25 mg by mouth in the morning and at bedtime. 03/01/21  Yes Hilty, Nadean Corwin, MD  furosemide (LASIX) 20 MG tablet TAKE 1 TABLET (20 MG TOTAL) BY MOUTH DAILY AS NEEDED FOR EDEMA. 12/09/20  Yes Vivi Barrack, MD  gabapentin (NEURONTIN) 100 MG capsule TAKE 1 CAPSULE BY MOUTH THREE TIMES A DAY Patient taking differently: Take 100 mg by mouth 3 (three) times daily. 02/27/21  Yes Vivi Barrack, MD  halobetasol (ULTRAVATE) 0.05 % cream APPLY TO AFFECTED AREA TWICE A DAY Patient taking differently: Apply 1 application topically in the morning and at bedtime. 03/03/21  Yes Vivi Barrack, MD  metFORMIN (GLUCOPHAGE) 1000 MG tablet TAKE 1 TABLET BY MOUTH EVERY DAY WITH BREAKFAST Patient taking differently: Take 1,000 mg by mouth daily. 02/28/21  Yes Vivi Barrack, MD  nystatin cream (MYCOSTATIN) APPLY TO AFFECTED AREA TWICE A DAY Patient taking differently: Apply 1 application topically 2 (two) times daily. 03/24/21  Yes Vivi Barrack, MD  pantoprazole (PROTONIX) 40 MG tablet TAKE 1 TABLET BY MOUTH EVERY DAY Patient taking differently: Take 40 mg by mouth daily. 02/27/21  Yes Vivi Barrack, MD  potassium chloride SA (KLOR-CON M20) 20 MEQ tablet TAKE 2 TABLETS BY MOUTH EVERY MORNING Patient taking  differently: Take 40 mEq by mouth daily. 09/12/20  Yes Vivi Barrack, MD  sacubitril-valsartan (ENTRESTO) 49-51 MG TAKE 1 TABLET BY MOUTH TWICE A DAY Patient taking differently: Take 1 tablet by mouth 2 (two) times daily. 03/01/21  Yes Hilty, Nadean Corwin, MD  sildenafil (VIAGRA) 100 MG tablet TAKE 1 TABLET DAILY AS NEEDED ED Patient taking differently: Take 100 mg by mouth daily as needed for erectile dysfunction. 05/03/20  Yes Vivi Barrack, MD  solifenacin (VESICARE) 10 MG tablet Take 10 mg by mouth daily. 04/13/20  Yes [provider]  tamsulosin (FLOMAX) 0.4 MG CAPS capsule TAKE 1 CAPSULE BY MOUTH EVERY DAY Patient taking differently: Take 0.4 mg by mouth daily. 11/09/20  Yes Pixie Casino, MD  ACCU-CHEK AVIVA PLUS test strip USE TO TEST GLUCOSE ONCE DAILY E11.9 08/08/20   Jerline Pain,  Algis Greenhouse, MD  Accu-Chek FastClix Lancets MISC USE TO CHECK BLOOD SUGAR DAILY AND AS NEEDED 01/06/21   Vivi Barrack, MD  doxycycline (VIBRA-TABS) 100 MG tablet Take 1 tablet (100 mg total) by mouth 2 (two) times daily. Patient not taking: Reported on 05/11/2021 03/24/21   Vivi Barrack, MD  LORazepam (ATIVAN) 0.5 MG tablet TAKE 1 TABLET BY MOUTH EVERY 8 HOURS AS NEEDED FOR ANXIETY. Patient not taking: Reported on 05/11/2021 12/12/20   Vivi Barrack, MD  metroNIDAZOLE (METROCREAM) 0.75 % cream Apply topically 2 (two) times daily. Patient not taking: Reported on 05/11/2021 02/24/21   Allwardt, Randa Evens, PA-C  tadalafil (CIALIS) 10 MG tablet Take 0.5-1 tablets (5-10 mg total) by mouth daily as needed for erectile dysfunction. Patient not taking: Reported on 05/11/2021 04/18/21   Vivi Barrack, MD    Inpatient Medications: Scheduled Meds:  apixaban  5 mg Oral BID   [START ON 05/12/2021] aspirin EC  81 mg Oral Daily   carvedilol  25 mg Oral BID   darifenacin  7.5 mg Oral Daily   gabapentin  100 mg Oral TID   insulin aspart  0-15 Units Subcutaneous TID WC   [START ON 05/12/2021] metFORMIN  1,000 mg  Oral Q breakfast   pantoprazole  40 mg Oral Daily   [START ON 05/12/2021] potassium chloride SA  40 mEq Oral Daily   sacubitril-valsartan  1 tablet Oral BID   sodium chloride flush  3 mL Intravenous Q12H   tamsulosin  0.4 mg Oral Daily   Continuous Infusions:  sodium chloride     PRN Meds: sodium chloride, acetaminophen, LORazepam, metoprolol tartrate, ondansetron (ZOFRAN) IV, sodium chloride flush  Allergies:    Allergies  Allergen Reactions   Lisinopril Cough    Social History:   Social History   Socioeconomic History   Marital status: Married    Spouse name: Not on file   Number of children: 1   Years of education: 16+   Highest education level: Not on file  Occupational History   Occupation: Curator - [employer]    Employer: RETIRED  Tobacco Use   Smoking status: Former    Years: 20.00    Types: Cigarettes    Quit date: 07/26/1981    Years since quitting: 39.8   Smokeless tobacco: Never  Vaping Use   Vaping Use: Never used  Substance and Sexual Activity   Alcohol use: Not Currently    Comment: occasional glass of wine   Drug use: No   Sexual activity: Not Currently  Other Topics Concern   Not on file  Social History Narrative   Lives with wife.   epworth sleepiness scale = 5 (01/20/16)   Social Determinants of Health   Financial Resource Strain: Medium Risk   Difficulty of Paying Living Expenses: Somewhat hard  Food Insecurity: No Food Insecurity   Worried About Charity fundraiser in the Last Year: Never true   Ran Out of Food in the Last Year: Never true  Transportation Needs: No Transportation Needs   Lack of Transportation (Medical): No   Lack of Transportation (Non-Medical): No  Physical Activity: Not on file  Stress: Not on file  Social Connections: Not on file  Intimate Partner Violence: Not on file    Family History:    Family History  Problem Relation Age of Onset   Stroke Father 39       Died age 33   Coronary  artery disease  Brother 10   Colon cancer Neg Hx    Colon polyps Neg Hx    Diabetes Neg Hx    Esophageal cancer Neg Hx    Kidney disease Neg Hx    Gallbladder disease Neg Hx      ROS:  Please see the history of present illness.   All other ROS reviewed and negative.     Physical Exam/Data:   Vitals:   05/11/21 0930 05/11/21 1038 05/11/21 1200 05/11/21 1500  BP: 125/77 118/87 (!) 113/92 112/90  Pulse: 95 91 93 (!) 109  Resp: 17 (!) 22 17 20   Temp:  98.6 F (37 C) (!) 97.3 F (36.3 C)   TempSrc:  Oral Oral   SpO2: 91% 95% 96% 93%  Weight:      Height:        Intake/Output Summary (Last 24 hours) at 05/11/2021 1838 Last data filed at 05/11/2021 1224 Gross per 24 hour  Intake --  Output 1180 ml  Net -1180 ml   Last 3 Weights 05/11/2021 03/24/2021 02/24/2021  Weight (lbs) 205 lb 220 lb 6.4 oz 227 lb 3.2 oz  Weight (kg) 92.987 kg 99.973 kg 103.057 kg     Body mass index is 27.8 kg/m.  General:  Well nourished, well developed, in no acute distress HEENT: normal Neck: no JVD Vascular: No carotid bruits; Distal pulses 2+ bilaterally Cardiac:  normal S1, S2; irregular; no murmur  Lungs:  clear to auscultation bilaterally, no wheezing, rhonchi or rales  Abd: soft, nontender, no hepatomegaly  Ext: no edema Musculoskeletal:  No deformities, BUE and BLE strength normal and equal Skin: warm and dry  Neuro:  CNs 2-12 intact, no focal abnormalities noted Psych:  Normal affect   EKG:  The EKG was personally reviewed and demonstrates: Atrial flutter with RVR Telemetry:  Telemetry was personally reviewed and demonstrates: Atrial flutter, heart rate in the 110s  Relevant CV Studies:  Echo 11/14/2020 1. Left ventricular ejection fraction, by estimation, is 45 to 50%. The  left ventricle has mildly decreased function. The left ventricle  demonstrates global hypokinesis. There is mild concentric left ventricular  hypertrophy. Left ventricular diastolic  parameters are  indeterminate.   2. Right ventricular systolic function is normal. The right ventricular  size is normal. There is normal pulmonary artery systolic pressure.   3. Left atrial size was moderately dilated.   4. Right atrial size was moderately dilated.   5. The mitral valve is grossly normal. Trivial mitral valve  regurgitation. No evidence of mitral stenosis.   6. The aortic valve is grossly normal. There is mild calcification of the  aortic valve. Aortic valve regurgitation is mild. Mild aortic valve  sclerosis is present, with no evidence of aortic valve stenosis.   7. Aortic dilatation noted. There is moderate dilatation of the ascending  aorta, measuring 45 mm.   8. The inferior vena cava is normal in size with greater than 50%  respiratory variability, suggesting right atrial pressure of 3 mmHg.   Comparison(s): Prior images reviewed side by side. Changes from prior  study are noted. EF slightly improved compared to prior.   Conclusion(s)/Recommendation(s): Ascending aorta measured 4.5 cm,  increased from prior.   Laboratory Data:  High Sensitivity Troponin:   Recent Labs  Lab 05/11/21 0619  TROPONINIHS 4     Chemistry Recent Labs  Lab 05/11/21 0619  NA 137  K 4.1  CL 104  CO2 23  GLUCOSE 124*  BUN 20  CREATININE 0.83  CALCIUM 8.6*  GFRNONAA >60  ANIONGAP 10    No results for input(s): PROT, ALBUMIN, AST, ALT, ALKPHOS, BILITOT in the last 168 hours. Lipids No results for input(s): CHOL, TRIG, HDL, LABVLDL, LDLCALC, CHOLHDL in the last 168 hours.  Hematology Recent Labs  Lab 05/11/21 0619  WBC 5.4  RBC 4.95  HGB 14.8  HCT 43.4  MCV 87.7  MCH 29.9  MCHC 34.1  RDW 13.6  PLT 176   Thyroid  Recent Labs  Lab 05/11/21 1529  TSH 1.958    BNPNo results for input(s): BNP, PROBNP in the last 168 hours.  DDimer No results for input(s): DDIMER in the last 168 hours.   Radiology/Studies:  DG Chest 1 View  Result Date: 05/11/2021 CLINICAL DATA:  HF EXAM:  CHEST  1 VIEW COMPARISON:  X-ray dated January FINDINGS: Cardiac mediastinal. Low lung volumes with hypoventilatory changes. Bibasilar opacities which are likely atelectasis. No focal consolidation. No large pleural effusion or pneumothorax. IMPRESSION: No active disease. Electronically Signed   By: Yetta Glassman M.D.   On: 05/11/2021 12:50     Assessment and Plan:   Atrial flutter with RVR of unknown duration: Last normal EKG was on 10/11/2020.  Patient denies any chest pain, palpitation, shortness of breath, fatigue or dizziness.  He has not missed any doses of carvedilol recently.  -Discussed with MD, will obtain limited echocardiogram to reassess his ejection fraction. -As long as his ejection fraction is stable, I think we can approach him using rate control strategy and outpatient cardioversion after 3 weeks of anticoagulation.  Obtain limited echocardiogram.  Of course, if EF is severely low or rate control is difficult, may still consider TEE cardioversion during this admission.   -Agree with initiating Eliquis.  Can stop ASA with starting Eliquis. Patient drinks alcohol rarely.  TSH normal  CAD: Denies any recent chest pain  Nonischemic cardiomyopathy: Euvolemic on exam.  He has not taken any Lasix recently.  He is compliant with Entresto.  Hypertension  Hyperlipidemia  DM2  Right BKA   Risk Assessment/Risk Scores:          CHA2DS2-VASc Score = 5   This indicates a 7.2% annual risk of stroke. The patient's score is based upon: CHF History: 1 HTN History: 1 Diabetes History: 0 Stroke History: 0 Vascular Disease History: 1 Age Score: 2 Gender Score: 0        For questions or updates, please contact [facility] Please consult [email or web address] for contact info under    Hilbert Corrigan, [place]  05/11/2021 6:38 PM  Patient seen and examined.  Agree with above documentation.  Mr. Baize is a 80 year old male with history of chronic systolic heart failure (EF 45 to  50%), CAD, hypertension, T2DM, right BKA who we are consulted by Dr. Roosevelt Locks for evaluation of atrial flutter.  He was initially diagnosed with reduced LV systolic function in 3016, with EF 45 to 50%.  Cardiac catheterization on 06/03/2018 showed 70% ostial OM1 lesion (small vessel), 50% mid LAD, 40% mid RCA, 60% proximal PDA, 40% D1.  Felt to be nonischemic cardiomyopathy.  It was thought to possibly be PVC induced but patient declined monitor.  Echocardiogram 11/14/2020 showed stable systolic dysfunction (EF 45 to 50%), global hypokinesis dilated ascending aorta measuring 45 mm.  He presented to the ED today after picking at it callus on his left foot and causing bleeding.  He was incidentally found to be in atrial flutter with RVR.  He denies any symptoms with  this.  Initial EKG showed atrial flutter, rate 134.  Initial vital signs notable for BP 138/97, SPO2 96% on room air.  Labs notable for creatinine 0.8, troponin 4, hemoglobin 14.8, WBC 5.4.  Telemetry reviewed, shows atrial flutter with rate currently in 100s to 110s.  On exam, patient is alert and oriented, irregular rhythm, tachycardic, no murmurs, lungs CTAB, no LE edema or JVD.  For his atrial flutter, this is a new diagnosis.  CHA2DS2-VASc score 5, started on Eliquis 5 mg twice daily.  Can stop aspirin with starting Eliquis.  Restarted home carvedilol for rate control.  Rates appear reasonably well controlled.  If rate controlled and echo does not show significant systolic dysfunction, can plan for outpatient cardioversion after completing 3 weeks of anticoagulation.  If difficult to rate control or EF significantly reduced, would plan on inpatient TEE/cardioversion.  Having some soft BPs, will reduce Entresto dose.  Donato Heinz, MD

## 2021-05-11 NOTE — H&P (Signed)
History and Physical    [name] EVO:[id number] DOB: 1941/05/09 DOA: 05/11/2021  PCP: Vivi Barrack, MD (Confirm with patient/family/NH records and if not entered, this has to be entered at [facility] point of entry) Patient coming from: Home  I have personally briefly reviewed patient's old medical records in [place]  Chief Complaint: Feeling ok  HPI: [name] is a 80 y.o. male with medical history significant of chronic systolic CHF LVEF 29-93%, HTN, IIDM, HLD, paroxysmal SVT, PVD status post right BKA, BPH, presented with uncontrolled A. fib.  Patient developed left midfoot callus, which he felt itchy and tried to remove the callus at home himself.  When he did last night, it caused significant bleeding despite self bandage at home.  He came to ED and when it was found the patient had A. fib with RVR.  Patient however denied any chest pain shortness of breath or palpitations.  ED Course: Patient was found to have A. fib with RVR, Cardizem bolus and drip started in the ED. K=4.1.  Review of Systems: As per HPI otherwise 14 point review of systems negative.    Past Medical History:  Diagnosis Date   CAD (coronary artery disease)    a. Remote cath 2008 for chest pain showed 30% LAD with luminal irregularity and fairly heavy calcification in the mid LAD without critical stenosis, 30% OM1, 30% acute marginal.   Cancer (HCC)    skin cancer - basal   Cataract    both   Chronic combined systolic and diastolic CHF (congestive heart failure) (HCC)    Complication of anesthesia    Diabetes mellitus    Type II   ED (erectile dysfunction)    History of kidney stones    x1   Hypertension    NICM (nonischemic cardiomyopathy) ([place])    a. EF 40-45% by echo in 08/2017 - prior low risk stress test in 2017.   Osteoarthritis    Paroxysmal SVT (supraventricular tachycardia) (HCC)    PONV (postoperative nausea and vomiting) 1960   with Ether   PVC's (premature ventricular  contractions)    PVD (peripheral vascular disease) (HCC)    s/p R BKA   Rheumatic fever    Rhinitis    S/P BKA (below knee amputation), right [facility])     Past Surgical History:  Procedure Laterality Date   AMPUTATION Right 02/29/2016   Procedure: Right Leg AMPUTATION BELOW KNEE with Wound Vac placement;  Surgeon: [name], MD;  Location: [place];  Service: Orthopedics;  Laterality: Right;   CHOLECYSTECTOMY  1983   colonoscopy  2006, 2010   EYE SURGERY Bilateral    cataract   INGUINAL HERNIA REPAIR Right 1960   LEFT HEART CATH AND CORONARY ANGIOGRAPHY N/A 06/03/2018   Procedure: LEFT HEART CATH AND CORONARY ANGIOGRAPHY;  Surgeon: Belva Crome, MD;  Location: [facility];  Service: Cardiovascular;  Laterality: N/A;   STUMP REVISION Right 10/30/2017   Procedure: RIGHT BELOW KNEE AMPUTATION REVISION;  Surgeon: [name], MD;  Location: [place];  Service: Orthopedics;  Laterality: Right;     reports that he quit smoking about 39 years ago. His smoking use included cigarettes. He has never used smokeless tobacco. He reports current alcohol use. He reports that he does not use drugs.  Allergies  Allergen Reactions   Lisinopril Cough    Family History  Problem Relation Age of Onset   Stroke Father 80  Died age 8   Coronary artery disease Brother 59   Colon cancer Neg Hx    Colon polyps Neg Hx    Diabetes Neg Hx    Esophageal cancer Neg Hx    Kidney disease Neg Hx    Gallbladder disease Neg Hx     Prior to Admission medications   Medication Sig Start Date End Date Taking? Authorizing Provider  aspirin 325 MG tablet Take 325 mg by mouth daily.   Yes [provider]  aspirin-sod bicarb-citric acid (ALKA-SELTZER) 325 MG TBEF tablet Take 650 mg by mouth daily as needed (heart burn).   Yes [provider]  carvedilol (COREG) 25 MG tablet TAKE 1 TABLET BY MOUTH TWICE A DAY Patient taking differently: Take 25 mg by mouth in the morning and at bedtime.  03/01/21  Yes Hilty, Nadean Corwin, MD  furosemide (LASIX) 20 MG tablet TAKE 1 TABLET (20 MG TOTAL) BY MOUTH DAILY AS NEEDED FOR EDEMA. 12/09/20  Yes Vivi Barrack, MD  gabapentin (NEURONTIN) 100 MG capsule TAKE 1 CAPSULE BY MOUTH THREE TIMES A DAY Patient taking differently: Take 100 mg by mouth 3 (three) times daily. 02/27/21  Yes Vivi Barrack, MD  halobetasol (ULTRAVATE) 0.05 % cream APPLY TO AFFECTED AREA TWICE A DAY Patient taking differently: Apply 1 application topically in the morning and at bedtime. 03/03/21  Yes Vivi Barrack, MD  metFORMIN (GLUCOPHAGE) 1000 MG tablet TAKE 1 TABLET BY MOUTH EVERY DAY WITH BREAKFAST Patient taking differently: Take 1,000 mg by mouth daily. 02/28/21  Yes Vivi Barrack, MD  nystatin cream (MYCOSTATIN) APPLY TO AFFECTED AREA TWICE A DAY Patient taking differently: Apply 1 application topically 2 (two) times daily. 03/24/21  Yes Vivi Barrack, MD  pantoprazole (PROTONIX) 40 MG tablet TAKE 1 TABLET BY MOUTH EVERY DAY Patient taking differently: Take 40 mg by mouth daily. 02/27/21  Yes Vivi Barrack, MD  potassium chloride SA (KLOR-CON M20) 20 MEQ tablet TAKE 2 TABLETS BY MOUTH EVERY MORNING Patient taking differently: Take 40 mEq by mouth daily. 09/12/20  Yes Vivi Barrack, MD  sacubitril-valsartan (ENTRESTO) 49-51 MG TAKE 1 TABLET BY MOUTH TWICE A DAY Patient taking differently: Take 1 tablet by mouth 2 (two) times daily. 03/01/21  Yes Hilty, Nadean Corwin, MD  sildenafil (VIAGRA) 100 MG tablet TAKE 1 TABLET DAILY AS NEEDED ED Patient taking differently: Take 100 mg by mouth daily as needed for erectile dysfunction. 05/03/20  Yes Vivi Barrack, MD  solifenacin (VESICARE) 10 MG tablet Take 10 mg by mouth daily. 04/13/20  Yes [provider]  tamsulosin (FLOMAX) 0.4 MG CAPS capsule TAKE 1 CAPSULE BY MOUTH EVERY DAY Patient taking differently: Take 0.4 mg by mouth daily. 11/09/20  Yes Pixie Casino, MD  ACCU-CHEK AVIVA PLUS test strip USE TO TEST  GLUCOSE ONCE DAILY E11.9 08/08/20   Vivi Barrack, MD  Accu-Chek FastClix Lancets MISC USE TO CHECK BLOOD SUGAR DAILY AND AS NEEDED 01/06/21   Vivi Barrack, MD  doxycycline (VIBRA-TABS) 100 MG tablet Take 1 tablet (100 mg total) by mouth 2 (two) times daily. Patient not taking: Reported on 05/11/2021 03/24/21   Vivi Barrack, MD  LORazepam (ATIVAN) 0.5 MG tablet TAKE 1 TABLET BY MOUTH EVERY 8 HOURS AS NEEDED FOR ANXIETY. Patient not taking: Reported on 05/11/2021 12/12/20   Vivi Barrack, MD  metroNIDAZOLE (METROCREAM) 0.75 % cream Apply topically 2 (two) times daily. Patient not taking: Reported on 05/11/2021 02/24/21  Allwardt, Alyssa M, PA-C  tadalafil (CIALIS) 10 MG tablet Take 0.5-1 tablets (5-10 mg total) by mouth daily as needed for erectile dysfunction. Patient not taking: Reported on 05/11/2021 04/18/21   Vivi Barrack, MD    Physical Exam: Vitals:   05/11/21 0904 05/11/21 0930 05/11/21 1038 05/11/21 1200  BP:  125/77 118/87 (!) 113/92  Pulse: 91 95 91 93  Resp: 19 17 (!) 22 17  Temp:   98.6 F (37 C) (!) 97.3 F (36.3 C)  TempSrc:   Oral Oral  SpO2: 97% 91% 95% 96%  Weight:      Height:        Constitutional: NAD, calm, comfortable Vitals:   05/11/21 0904 05/11/21 0930 05/11/21 1038 05/11/21 1200  BP:  125/77 118/87 (!) 113/92  Pulse: 91 95 91 93  Resp: 19 17 (!) 22 17  Temp:   98.6 F (37 C) (!) 97.3 F (36.3 C)  TempSrc:   Oral Oral  SpO2: 97% 91% 95% 96%  Weight:      Height:       Eyes: PERRL, lids and conjunctivae normal ENMT: Mucous membranes are moist. Posterior pharynx clear of any exudate or lesions.Normal dentition.  Neck: normal, supple, no masses, no thyromegaly Respiratory: clear to auscultation bilaterally, no wheezing, no crackles. Normal respiratory effort. No accessory muscle use.  Cardiovascular: Regular rate and rhythm, no murmurs / rubs / gallops. No extremity edema. 2+ pedal pulses. No carotid bruits.  Abdomen: no tenderness, no  masses palpated. No hepatosplenomegaly. Bowel sounds positive.  Musculoskeletal: no clubbing / cyanosis. No joint deformity upper and lower extremities. Good ROM, no contractures. Normal muscle tone.  Skin: Left midfoot shallow ulcer, clean-based Neurologic: CN 2-12 grossly intact. Sensation intact, DTR normal. Strength 5/5 in all 4.  Psychiatric: Normal judgment and insight. Alert and oriented x 3. Normal mood.     Labs on Admission: I have personally reviewed following labs and imaging studies  CBC: Recent Labs  Lab 05/11/21 0619  WBC 5.4  NEUTROABS 3.4  HGB 14.8  HCT 43.4  MCV 87.7  PLT 382   Basic Metabolic Panel: Recent Labs  Lab 05/11/21 0619  NA 137  K 4.1  CL 104  CO2 23  GLUCOSE 124*  BUN 20  CREATININE 0.83  CALCIUM 8.6*   GFR: Estimated Creatinine Clearance: 76.6 mL/min (by C-G formula based on SCr of 0.83 mg/dL). Liver Function Tests: No results for input(s): AST, ALT, ALKPHOS, BILITOT, PROT, ALBUMIN in the last 168 hours. No results for input(s): LIPASE, AMYLASE in the last 168 hours. No results for input(s): AMMONIA in the last 168 hours. Coagulation Profile: No results for input(s): INR, PROTIME in the last 168 hours. Cardiac Enzymes: No results for input(s): CKTOTAL, CKMB, CKMBINDEX, TROPONINI in the last 168 hours. BNP (last 3 results) No results for input(s): PROBNP in the last 8760 hours. HbA1C: No results for input(s): HGBA1C in the last 72 hours. CBG: Recent Labs  Lab 05/11/21 0756 05/11/21 1222  GLUCAP 122* 102*   Lipid Profile: No results for input(s): CHOL, HDL, LDLCALC, TRIG, CHOLHDL, LDLDIRECT in the last 72 hours. Thyroid Function Tests: No results for input(s): TSH, T4TOTAL, FREET4, T3FREE, THYROIDAB in the last 72 hours. Anemia Panel: No results for input(s): VITAMINB12, FOLATE, FERRITIN, TIBC, IRON, RETICCTPCT in the last 72 hours. Urine analysis:    Component Value Date/Time   COLORURINE YELLOW 12/10/2015 1018    APPEARANCEUR CLEAR 12/10/2015 1018   LABSPEC 1.018 12/10/2015 1018  PHURINE 5.5 12/10/2015 1018   GLUCOSEU NEGATIVE 12/10/2015 1018   HGBUR SMALL (A) 12/10/2015 1018   HGBUR negative 10/11/2009 0000   BILIRUBINUR Negative 01/27/2019 1412   KETONESUR NEGATIVE 12/10/2015 1018   PROTEINUR Negative 01/27/2019 1412   PROTEINUR NEGATIVE 12/10/2015 1018   UROBILINOGEN 0.2 01/27/2019 1412   UROBILINOGEN 0.2 08/04/2014 2335   NITRITE Negative 01/27/2019 1412   NITRITE NEGATIVE 12/10/2015 1018   LEUKOCYTESUR Negative 01/27/2019 1412    Radiological Exams on Admission: DG Chest 1 View  Result Date: 05/11/2021 CLINICAL DATA:  HF EXAM: CHEST  1 VIEW COMPARISON:  X-ray dated January FINDINGS: Cardiac mediastinal. Low lung volumes with hypoventilatory changes. Bibasilar opacities which are likely atelectasis. No focal consolidation. No large pleural effusion or pneumothorax. IMPRESSION: No active disease. Electronically Signed   By: Yetta Glassman M.D.   On: 05/11/2021 12:50    EKG: Independently reviewed.  Atrial flutter with 3-1 transduction  Assessment/Plan Principal Problem:   Atrial fibrillation with RVR (HCC) Active Problems:   A-fib (HCC)  (please populate well all problems here in Problem List. (For example, if patient is on BP meds at home and you resume or decide to hold them, it is a problem that needs to be her. Same for CAD, COPD, HLD and so on)  Atrial flutter with RVR -Persistent, although it was found last night, the actual duration is unknown given the patient himself was completely asymptomatic. -CHADS2=3, no significant history of GI bleed, no history of stroke or intracranial bleed, will start Eliquis.  Discussed with cardiology who will help [place] with rate control. -Continue Cardizem drip due to history of chronic systolic CHF, restart Coreg 25 mg twice daily, and as needed metoprolol. -Consult pharmacy for Eliquis.  Left foot ulcer -Discussed with wound care at bedside,  no signs of infection, continue wound care, recommend outpatient podiatry follow-up.  And [employer].  Chronic systolic CHF -Euvolemic, will check chest x-ray, discontinue CCB -Continue Coreg and Entresto.  IIDM -Continue metformin and glimepiride, add sliding scale.  BPH -Continue Flomax  DVT prophylaxis: Eliquis Code Status: Full code Family Communication: None at bedside Disposition Plan: Expect less than 2 midnight hospital stay Consults called: Cardiology Admission status: Telemetry observation   Lequita Halt MD [facility] Pager [phone]  05/11/2021, 2:06 PM

## 2021-05-11 NOTE — ED Notes (Signed)
Provided pt with oatmeal and milk for breakfast.

## 2021-05-11 NOTE — Progress Notes (Signed)
CBG 119 

## 2021-05-11 NOTE — ED Notes (Signed)
Report called to Marya Amsler, RN on [place] at [facility].

## 2021-05-11 NOTE — Progress Notes (Signed)
Heart Failure Nurse Navigator Progress Note  PCP: Vivi Barrack, MD PCP-Cardiologist: Alma Friendly., MD Admission Diagnosis: AF/Flutter RVR, foot wound Admitted from: home alone  Presentation:   [name] presented 12/15 with L medial foot wound and AF/Flutter with RVR. Pt resting in bed on room air with sister at bedside. Patient interactive with interview process. Pt states he lives alone. Drives a reliable vehicle with hand controls as he has a R BKA, prothesis on and cane present. Pt "ready to go home." HR 110-120s while in room, appears to be A.flutter.  Pt states he takes his medications as prescribed, but [name] is too costly for him. Filled Rx yesterday and it cost him over $100 as he is in the "donut hole".  Pt has used wound clinic in the past for his R foot wound (subsequent BKA with Dr. Sharol Roberts). But would prefer [facility] for wound care if needed.  Pt states he eats out often as he lives alone. Educated on sodium modifications.  Explained benefits of Heart & Vascular Transitions of Care Clinic appointment, patient agreeable.    ECHO/ LVEF: 45-50%  Clinical Course:  Past Medical History:  Diagnosis Date   CAD (coronary artery disease)    a. Remote cath 2008 for chest pain showed 30% LAD with luminal irregularity and fairly heavy calcification in the mid LAD without critical stenosis, 30% OM1, 30% acute marginal.   Cancer (HCC)    skin cancer - basal   Cataract    both   Chronic combined systolic and diastolic CHF (congestive heart failure) (HCC)    Complication of anesthesia    Diabetes mellitus    Type II   ED (erectile dysfunction)    History of kidney stones    x1   Hypertension    NICM (nonischemic cardiomyopathy) ([place])    a. EF 40-45% by echo in 08/2017 - prior low risk stress test in 2017.   Osteoarthritis    Paroxysmal SVT (supraventricular tachycardia) (HCC)    PONV (postoperative nausea and vomiting) 1960   with Ether   PVC's (premature ventricular contractions)     PVD (peripheral vascular disease) (HCC)    s/p R BKA   Rheumatic fever    Rhinitis    S/P BKA (below knee amputation), right (HCC)      Social History   Socioeconomic History   Marital status: Married    Spouse name: Not on file   Number of children: 1   Years of education: 16+   Highest education level: Not on file  Occupational History   Occupation: Curator - [employer]    Employer: RETIRED  Tobacco Use   Smoking status: Former    Years: 20.00    Types: Cigarettes    Quit date: 07/26/1981    Years since quitting: 39.8   Smokeless tobacco: Never  Vaping Use   Vaping Use: Never used  Substance and Sexual Activity   Alcohol use: Not Currently    Comment: occasional glass of wine   Drug use: No   Sexual activity: Not Currently  Other Topics Concern   Not on file  Social History Narrative   Lives with wife.   epworth sleepiness scale = 5 (01/20/16)   Social Determinants of Health   Financial Resource Strain: Medium Risk   Difficulty of Paying Living Expenses: Somewhat hard  Food Insecurity: No Food Insecurity   Worried About Running Out of Food in the Last Year: Never true   Ran  Out of Food in the Last Year: Never true  Transportation Needs: No Transportation Needs   Lack of Transportation (Medical): No   Lack of Transportation (Non-Medical): No  Physical Activity: Not on file  Stress: Not on file  Social Connections: Not on file    High Risk Criteria for Readmission and/or Poor Patient Outcomes: Heart failure hospital admissions (last 6 months): 1  No Show rate: 1% Difficult social situation: no Demonstrates medication adherence: yes Primary Language: English Literacy level: able to read/write and comprehend. Wears glasses.  Education Assessment and Provision:  Detailed education and instructions provided on heart failure disease management including the following:  Signs and symptoms of Heart Failure When to call the  physician Importance of daily weights Low sodium diet Fluid restriction Medication management Anticipated future follow-up appointments  Patient education Roberts on each of the above topics.  Patient acknowledges understanding via teach back method and acceptance of all instructions.  Education Materials:  "Living Better With Heart Failure" Booklet, HF zone tool, & Daily Weight Tracker Tool.  Patient has scale at home: yes Patient has pill box at home: yes   Barriers of Care:   -medical understanding -diet/fluid indiscretions  Considerations/Referrals:   Referral made to Heart Failure Pharmacist Stewardship: no Referral made to Heart Failure CSW/NCM TOC: no Referral made to Heart & Vascular TOC clinic: pending  Items for Follow-up on DC/TOC: -optimze   Pricilla Holm, MSN, RN Heart Failure Nurse Navigator [phone]

## 2021-05-11 NOTE — ED Provider Notes (Signed)
[facility] DEPT [facility] Provider Note: Georgena Spurling, MD, FACEP  CSN: [id number] MRN: [id number] ARRIVAL: 05/11/21 at [place]: [place] [facility]   HISTORY OF PRESENT ILLNESS  05/11/21 5:38 AM [name] is a 80 y.o. male who has had a longstanding callus on the medial aspect of the sole of his left foot associated with chronic fallen arch.  Yesterday evening he picked at the callus and it came off leaving a wound on his foot.  The bleeding has been controlled.  He is having minimal pain due to diabetic neuropathy.  He is not having any palpitations, chest pain or shortness of breath currently.  He is status post right BKA.   Past Medical History:  Diagnosis Date   CAD (coronary artery disease)    a. Remote cath 2008 for chest pain showed 30% LAD with luminal irregularity and fairly heavy calcification in the mid LAD without critical stenosis, 30% OM1, 30% acute marginal.   Cancer (HCC)    skin cancer - basal   Cataract    both   Chronic combined systolic and diastolic CHF (congestive heart failure) (HCC)    Complication of anesthesia    Diabetes mellitus    Type II   ED (erectile dysfunction)    History of kidney stones    x1   Hypertension    NICM (nonischemic cardiomyopathy) ([place])    a. EF 40-45% by echo in 08/2017 - prior low risk stress test in 2017.   Osteoarthritis    Paroxysmal SVT (supraventricular tachycardia) (HCC)    PONV (postoperative nausea and vomiting) 1960   with Ether   PVC's (premature ventricular contractions)    PVD (peripheral vascular disease) (HCC)    s/p R BKA   Rheumatic fever    Rhinitis    S/P BKA (below knee amputation), right [facility])     Past Surgical History:  Procedure Laterality Date   AMPUTATION Right 02/29/2016   Procedure: Right Leg AMPUTATION BELOW KNEE with Wound Vac placement;  Surgeon: [name], MD;  Location: [place];  Service: Orthopedics;  Laterality: Right;   CHOLECYSTECTOMY  1983    colonoscopy  2006, 2010   EYE SURGERY Bilateral    cataract   INGUINAL HERNIA REPAIR Right 1960   LEFT HEART CATH AND CORONARY ANGIOGRAPHY N/A 06/03/2018   Procedure: LEFT HEART CATH AND CORONARY ANGIOGRAPHY;  Surgeon: Belva Crome, MD;  Location: [place] CV LAB;  Service: Cardiovascular;  Laterality: N/A;   STUMP REVISION Right 10/30/2017   Procedure: RIGHT BELOW KNEE AMPUTATION REVISION;  Surgeon: [name], MD;  Location: [place];  Service: Orthopedics;  Laterality: Right;    Family History  Problem Relation Age of Onset   Stroke Father 94       Died age 9   Coronary artery disease Brother 30   Colon cancer Neg Hx    Colon polyps Neg Hx    Diabetes Neg Hx    Esophageal cancer Neg Hx    Kidney disease Neg Hx    Gallbladder disease Neg Hx     Social History   Tobacco Use   Smoking status: Former    Years: 20.00    Types: Cigarettes    Quit date: 07/26/1981    Years since quitting: 39.8   Smokeless tobacco: Never  Vaping Use   Vaping Use: Never used  Substance Use Topics   Alcohol use: Yes    Comment: occasional glass  of wine   Drug use: No    Prior to Admission medications   Medication Sig Start Date End Date Taking? Authorizing Provider  ACCU-CHEK AVIVA PLUS test strip USE TO TEST GLUCOSE ONCE DAILY E11.9 08/08/20   Vivi Barrack, MD  Accu-Chek FastClix Lancets MISC USE TO CHECK BLOOD SUGAR DAILY AND AS NEEDED 01/06/21   Vivi Barrack, MD  aspirin 325 MG tablet Take 325 mg by mouth daily.    [provider]  aspirin-sod bicarb-citric acid (ALKA-SELTZER) 325 MG TBEF tablet Take 650 mg by mouth daily as needed (heart burn).    [provider]  carvedilol (COREG) 25 MG tablet TAKE 1 TABLET BY MOUTH TWICE A DAY 03/01/21   Hilty, Nadean Corwin, MD  doxycycline (VIBRA-TABS) 100 MG tablet Take 1 tablet (100 mg total) by mouth 2 (two) times daily. 03/24/21   Vivi Barrack, MD  furosemide (LASIX) 20 MG tablet TAKE 1 TABLET (20 MG TOTAL) BY MOUTH DAILY AS  NEEDED FOR EDEMA. 12/09/20   Vivi Barrack, MD  gabapentin (NEURONTIN) 100 MG capsule TAKE 1 CAPSULE BY MOUTH THREE TIMES A DAY 02/27/21   Vivi Barrack, MD  halobetasol (ULTRAVATE) 0.05 % cream APPLY TO AFFECTED AREA TWICE A DAY 03/03/21   Vivi Barrack, MD  HYDROcodone-acetaminophen (NORCO/VICODIN) 5-325 MG tablet Take 1 tablet by mouth every 6 (six) hours. 06/13/17   [provider]  hydrocortisone cream 1 % Apply 1 application topically daily as needed for itching.    [provider]  loratadine (CLARITIN) 10 MG tablet Take 10 mg by mouth daily as needed for allergies.     [provider]  LORazepam (ATIVAN) 0.5 MG tablet TAKE 1 TABLET BY MOUTH EVERY 8 HOURS AS NEEDED FOR ANXIETY. 12/12/20   Vivi Barrack, MD  metFORMIN (GLUCOPHAGE) 1000 MG tablet TAKE 1 TABLET BY MOUTH EVERY DAY WITH BREAKFAST 02/28/21   Vivi Barrack, MD  methocarbamol (ROBAXIN) 500 MG tablet Take 1 tablet by mouth 4 (four) times daily. 06/13/17   [provider]  metroNIDAZOLE (METROCREAM) 0.75 % cream Apply topically 2 (two) times daily. 02/24/21   Allwardt, Randa Evens, PA-C  Multiple Vitamin (MULTIVITAMIN) tablet Take 1 tablet by mouth daily.    [provider]  mupirocin ointment (BACTROBAN) 2 % Apply 1 application topically 2 (two) times daily. 07/12/20   Trula Slade, DPM  nystatin cream (MYCOSTATIN) APPLY TO AFFECTED AREA TWICE A DAY 03/24/21   Vivi Barrack, MD  pantoprazole (PROTONIX) 40 MG tablet TAKE 1 TABLET BY MOUTH EVERY DAY 02/27/21   Vivi Barrack, MD  potassium chloride (KLOR-CON) 10 MEQ tablet Take 2 tablets by mouth daily. 06/13/17   [provider]  potassium chloride SA (KLOR-CON M20) 20 MEQ tablet TAKE 2 TABLETS BY MOUTH EVERY MORNING 09/12/20   Vivi Barrack, MD  sacubitril-valsartan (ENTRESTO) 49-51 MG TAKE 1 TABLET BY MOUTH TWICE A DAY 03/01/21   Hilty, Nadean Corwin, MD  sildenafil (VIAGRA) 100 MG tablet TAKE 1 TABLET DAILY AS NEEDED ED 05/03/20    Vivi Barrack, MD  solifenacin (VESICARE) 10 MG tablet Take 10 mg by mouth daily. 04/13/20   [provider]  tadalafil (CIALIS) 10 MG tablet Take 0.5-1 tablets (5-10 mg total) by mouth daily as needed for erectile dysfunction. 04/18/21   Vivi Barrack, MD  tamsulosin (FLOMAX) 0.4 MG CAPS capsule TAKE 1 CAPSULE BY MOUTH EVERY DAY 11/09/20   Hilty, Nadean Corwin, MD  Tetrahydrozoline HCl (VISINE OP) Place 1 drop into both eyes daily as needed (dry eyes).    [provider]  traMADol (ULTRAM) 50 MG tablet Take 1 tablet (50 mg total) by mouth every 6 (six) hours as needed for moderate pain. 12/08/19   Vivi Barrack, MD    Allergies Lisinopril   REVIEW OF SYSTEMS  Negative except as noted here or in the History of Present Illness.   PHYSICAL EXAMINATION  Initial Vital Signs Blood pressure (!) 138/97, pulse (!) 136, temperature (!) 97.3 F (36.3 C), temperature source Oral, resp. rate 18, height 6' (1.829 m), weight 93 kg, SpO2 96 %.  Examination General: Well-developed, well-nourished male in no acute distress; appearance consistent with age of record HENT: normocephalic; atraumatic Eyes: pupils equal, round and reactive to light; extraocular muscles intact Neck: supple Heart: regular rate and rhythm; tachycardia Lungs: clear to auscultation bilaterally Abdomen: soft; nondistended; nontender; bowel sounds present Extremities: Right BKA; edema of left lower leg; palpable bony deformity of left medial foot with overlying wound:    Neurologic: Awake, alert and oriented; motor function intact in all extremities and symmetric; no facial droop; hard of hearing Skin: Warm and dry Psychiatric: Normal mood and affect   RESULTS  Summary of this visit's results, reviewed and interpreted by myself:   EKG Interpretation  Date/Time:  Thursday May 11 2021 05:41:54 EST Ventricular Rate:  134 PR Interval:  125 QRS Duration: 158 QT Interval:  396 QTC  Calculation: 700 R Axis:   7 Text Interpretation: Indeterminate Rhythm IVCD, consider atypical RBBB Borderline ST depression, lateral leads Previously NSR with PVCs Reconfirmed by Kimanh Templeman, Jenny Reichmann [phone]) on 05/11/2021 5:49:08 AM     EKG Interpretation  Date/Time:  Thursday May 11 2021 06:14:58 EST Ventricular Rate:  78 PR Interval:  125 QRS Duration: 147 QT Interval:  395 QTC Calculation: 450 R Axis:   1 Text Interpretation: Atrial flutter Ventricular premature complex Right bundle branch block Confirmed by Deatrice Spanbauer [id number]) on 05/11/2021 6:16:46 AM         Laboratory Studies: Results for orders placed or performed during the hospital encounter of 05/11/21 (from the past 24 hour(s))  CBC with Differential/Platelet     Status: None   Collection Time: 05/11/21  6:19 AM  Result Value Ref Range   WBC 5.4 4.0 - 10.5 K/uL   RBC 4.95 4.22 - 5.81 MIL/uL   Hemoglobin 14.8 13.0 - 17.0 g/dL   HCT 43.4 39.0 - 52.0 %   MCV 87.7 80.0 - 100.0 fL   MCH 29.9 26.0 - 34.0 pg   MCHC 34.1 30.0 - 36.0 g/dL   RDW 13.6 11.5 - 15.5 %   Platelets 176 150 - 400 K/uL   nRBC 0.0 0.0 - 0.2 %   Neutrophils Relative % 62 %   Neutro Abs 3.4 1.7 - 7.7 K/uL   Lymphocytes Relative 21 %   Lymphs Abs 1.1 0.7 - 4.0 K/uL   Monocytes Relative 11 %   Monocytes Absolute 0.6 0.1 - 1.0 K/uL   Eosinophils Relative 4 %   Eosinophils Absolute 0.2 0.0 - 0.5 K/uL   Basophils Relative 2 %   Basophils Absolute 0.1 0.0 - 0.1 K/uL   Immature Granulocytes 0 %   Abs Immature Granulocytes 0.01 0.00 - 0.07 K/uL  Basic metabolic panel     Status: Abnormal   Collection Time: 05/11/21  6:19 AM  Result Value Ref Range   Sodium 137 135 - 145 mmol/L  Potassium 4.1 3.5 - 5.1 mmol/L   Chloride 104 98 - 111 mmol/L   CO2 23 22 - 32 mmol/L   Glucose, Bld 124 (H) 70 - 99 mg/dL   BUN 20 8 - 23 mg/dL   Creatinine, Ser 0.83 0.61 - 1.24 mg/dL   Calcium 8.6 (L) 8.9 - 10.3 mg/dL   GFR, Estimated >60 >60 mL/min   Anion gap 10  5 - 15  Troponin I (High Sensitivity)     Status: None   Collection Time: 05/11/21  6:19 AM  Result Value Ref Range   Troponin I (High Sensitivity) 4 <18 ng/L   Imaging Studies: No results found.  ED COURSE and MDM  Nursing notes, initial and subsequent vitals signs, including pulse oximetry, reviewed and interpreted by myself.  Vitals:   05/11/21 0545 05/11/21 0600 05/11/21 0615 05/11/21 0630  BP: 124/88 (!) 125/101 116/73 117/70  Pulse: (!) 134 (!) 126 90 88  Resp: (!) 26 (!) 24 20 19   Temp:      TempSrc:      SpO2: 97% 95% 96% 96%  Weight:      Height:       Medications  diltiazem (CARDIZEM) 1 mg/mL load via infusion 20 mg (20 mg Intravenous Bolus from Bag 05/11/21 0603)    And  diltiazem (CARDIZEM) 125 mg in dextrose 5% 125 mL (1 mg/mL) infusion (5 mg/hr Intravenous New Bag/Given 05/11/21 0602)  ceFAZolin (ANCEF) IVPB 1 g/50 mL premix (0 g Intravenous Stopped 05/11/21 0642)  Thrombi-Pad 3"X3" pad 1 each (1 each Topical Given 05/11/21 0625)   6:06 AM Ancef given to start wound prophylaxis in this nondiabetic with the potential for a chronic diabetic ulcer.  Cardizem 20 mg given for tachycardia with some irregularity suspicious for atrial fibrillation with RVR.  He has a history of SVT but no history of atrial fibrillation  6:09 AM Rate has improved with Cardizem bolus and rhythm appears to be atrial flutter.  6:21 AM We will dressed wound with thrombin pad layers covered with gauze and then Coban.  This will help protect the wound and keep it hemostatic especially if we need to start anticoagulation for the patient's new onset atrial flutter.  6:46 AM Patient remains in a flutter with a rate in the 90s on Cardizem drip.  We will have him admitted for new onset atrial flutter with rapid ventricular rate.  PROCEDURES  Procedures CRITICAL CARE Performed by: Karen Chafe Altair Appenzeller Total critical care time: 35 minutes Critical care time was exclusive of separately billable  procedures and treating other patients. Critical care was necessary to treat or prevent imminent or life-threatening deterioration. Critical care was time spent personally by me on the following activities: development of treatment plan with patient and/or surrogate as well as nursing, discussions with consultants, evaluation of patient's response to treatment, examination of patient, obtaining history from patient or surrogate, ordering and performing treatments and interventions, ordering and review of laboratory studies, ordering and review of radiographic studies, pulse oximetry and re-evaluation of patient's condition.   ED DIAGNOSES     ICD-10-CM   1. Atrial flutter with rapid ventricular response (HCC)  I48.92     2. Ulcer of left foot with fat layer exposed ([place])  L97.522          Shanon Rosser, MD 05/11/21 [phone]

## 2021-05-11 NOTE — Plan of Care (Signed)
°  Problem: Education: Goal: Knowledge of General Education information will improve Description: Including pain rating scale, medication(s)/side effects and non-pharmacologic comfort measures Outcome: Progressing   Problem: Clinical Measurements: Goal: Cardiovascular complication will be avoided Outcome: Progressing   Problem: Nutrition: Goal: Adequate nutrition will be maintained Outcome: Progressing   

## 2021-05-11 NOTE — ED Triage Notes (Signed)
Pt states he had a callus on left foot that came off last night. Pt states he pulled on part of it and had bleeding. Pt's foot bandaged on arrival, bleeding controlled.

## 2021-05-11 NOTE — Consult Note (Signed)
[facility] Nurse Consult Note: Patient receiving care in [place] Reason for Consult: Left foot wound Wound type: partial thickness wound on the medial aspect of the sole of the left foot associated with chronic fallen arch. States he had callus formation and picked at the callus until the top layer of skin came off.  Pressure Injury POA: NA Measurement: 2 x 1.5 Wound bed: Pink Drainage (amount, consistency, odor) None Periwound: intact Dressing procedure/placement/frequency: Place a cut to fit piece of Xeroform gauze over the wound and secure with a foam dressing. Change daily.  Recommend podiatrist consult and new shoe insoles for off loading of this area. States the insole he has now is old and he got it at the [employer] on [address].   Monitor the wound area(s) for worsening of condition such as: Signs/symptoms of infection, increase in size, development of or worsening of odor, development of pain, or increased pain at the affected locations.   Notify the medical team if any of these develop.  Thank you for the consult. [place] nurse will not follow at this time.   Please re-consult the [place] team if needed.  Cathlean Marseilles Tamala Julian, MSN, RN, [place], [place]le [place], [facility] Wound Treatment Associate Pager [phone]

## 2021-05-11 NOTE — TOC Benefit Eligibility Note (Signed)
Patient Teacher, English as a foreign language completed.    The patient is currently admitted and upon discharge could be taking Eliquis 5 mg.  The current 30 day co-pay is, $141.49 due to being in Coverage Gap (donut hole).   The patient is insured through [place], [place] Patient Advocate Specialist [place] Patient Advocate Team Direct Number: [phone]  Fax: [phone]

## 2021-05-11 NOTE — ED Notes (Signed)
Report given to [facility]. 

## 2021-05-11 NOTE — Progress Notes (Signed)
ANTICOAGULATION CONSULT NOTE - Initial Consult  Pharmacy Consult for apixaban Indication: atrial fibrillation  Allergies  Allergen Reactions   Lisinopril Cough    Patient Measurements: Height: 6' (182.9 cm) Weight: 93 kg (205 lb) IBW/kg (Calculated) : 77.6  Vital Signs: Temp: 97.3 F (36.3 C) (12/15 1200) Temp Source: Oral (12/15 1200) BP: 113/92 (12/15 1200) Pulse Rate: 93 (12/15 1200)  Labs: Recent Labs    05/11/21 0619  HGB 14.8  HCT 43.4  PLT 176  CREATININE 0.83  TROPONINIHS 4    Estimated Creatinine Clearance: 76.6 mL/min (by C-G formula based on SCr of 0.83 mg/dL).   Medical History: Past Medical History:  Diagnosis Date   CAD (coronary artery disease)    a. Remote cath 2008 for chest pain showed 30% LAD with luminal irregularity and fairly heavy calcification in the mid LAD without critical stenosis, 30% OM1, 30% acute marginal.   Cancer (HCC)    skin cancer - basal   Cataract    both   Chronic combined systolic and diastolic CHF (congestive heart failure) (HCC)    Complication of anesthesia    Diabetes mellitus    Type II   ED (erectile dysfunction)    History of kidney stones    x1   Hypertension    NICM (nonischemic cardiomyopathy) ([place])    a. EF 40-45% by echo in 08/2017 - prior low risk stress test in 2017.   Osteoarthritis    Paroxysmal SVT (supraventricular tachycardia) (HCC)    PONV (postoperative nausea and vomiting) 1960   with Ether   PVC's (premature ventricular contractions)    PVD (peripheral vascular disease) (HCC)    s/p R BKA   Rheumatic fever    Rhinitis    S/P BKA (below knee amputation), right [facility])      Assessment: 80 year old male with new onset afib to start on apixaban. He does not appear to be on anticoagulation prior to admit. CBC within normal limits. Of note patient was on full dose aspirin prior to admission, will reduce to low dose aspirin with the addition of apixaban. Patient currently meets criteria for full  dose apixaban.   Copay cost of apixaban is $141 due to gap coverage (donut hole), will discuss with patient having first fill with [facility] pharmacy to ensure he receives his first 30 days free then he should be out of gap coverage next year.   Goal of Therapy:  Monitor platelets by anticoagulation protocol: Yes   Plan:  Apixaban 5mg  po bid Reduce aspirin to 81mg  daily  Erin Hearing PharmD., BCPS Clinical Pharmacist 05/11/2021 1:20 PM

## 2021-05-11 NOTE — ED Notes (Signed)
Pt provided with cup of coffee per request. Denies further needs at this time. Call light within reach.

## 2021-05-12 ENCOUNTER — Other Ambulatory Visit (HOSPITAL_COMMUNITY): Payer: Self-pay

## 2021-05-12 ENCOUNTER — Observation Stay (HOSPITAL_BASED_OUTPATIENT_CLINIC_OR_DEPARTMENT_OTHER): Payer: Medicare Other

## 2021-05-12 DIAGNOSIS — I4892 Unspecified atrial flutter: Secondary | ICD-10-CM | POA: Diagnosis not present

## 2021-05-12 DIAGNOSIS — I428 Other cardiomyopathies: Secondary | ICD-10-CM

## 2021-05-12 DIAGNOSIS — I4891 Unspecified atrial fibrillation: Secondary | ICD-10-CM | POA: Diagnosis not present

## 2021-05-12 LAB — ECHOCARDIOGRAM LIMITED
Calc EF: 40.4 %
Height: 72 in
S' Lateral: 3.6 cm
Single Plane A2C EF: 36.3 %
Single Plane A4C EF: 39.6 %
Weight: 3379.21 oz

## 2021-05-12 LAB — BASIC METABOLIC PANEL
Anion gap: 7 (ref 5–15)
BUN: 17 mg/dL (ref 8–23)
CO2: 24 mmol/L (ref 22–32)
Calcium: 8.6 mg/dL — ABNORMAL LOW (ref 8.9–10.3)
Chloride: 106 mmol/L (ref 98–111)
Creatinine, Ser: 0.86 mg/dL (ref 0.61–1.24)
GFR, Estimated: >60 mL/min (ref >60)
Glucose, Bld: 100 mg/dL — ABNORMAL HIGH (ref 70–99)
Potassium: 3.6 mmol/L (ref 3.5–5.1)
Sodium: 137 mmol/L (ref 135–145)

## 2021-05-12 LAB — GLUCOSE, CAPILLARY
Glucose-Capillary: 119 mg/dL — ABNORMAL HIGH (ref 70–99)
Glucose-Capillary: 128 mg/dL — ABNORMAL HIGH (ref 70–99)
Glucose-Capillary: 121 mg/dL — ABNORMAL HIGH (ref 70–99)

## 2021-05-12 MED ORDER — DIGOXIN 0.25 MG/ML IJ SOLN
0.2500 mg | Freq: Once | INTRAMUSCULAR | Status: DC
  Filled 2021-05-12: qty 1

## 2021-05-12 MED ORDER — DIGOXIN 125 MCG PO TABS
0.1250 mg | ORAL_TABLET | Freq: Every day | ORAL | 0 refills | Status: DC
  Filled 2021-05-12: qty 30, 30d supply, fill #0

## 2021-05-12 MED ORDER — DIGOXIN 125 MCG PO TABS: 0.1250 mg | ORAL_TABLET | Freq: Every day | ORAL | Status: DC

## 2021-05-12 MED ORDER — APIXABAN 5 MG PO TABS
5.0000 mg | ORAL_TABLET | Freq: Two times a day (BID) | ORAL | 0 refills | Status: DC
  Filled 2021-05-12: qty 60, 30d supply, fill #0

## 2021-05-12 MED ORDER — FOLIC ACID 1 MG PO TABS
1.0000 mg | ORAL_TABLET | Freq: Every day | ORAL | 0 refills | Status: DC
  Filled 2021-05-12: qty 30, 30d supply, fill #0

## 2021-05-12 MED ORDER — ATORVASTATIN CALCIUM 10 MG PO TABS: 20.0000 mg | ORAL_TABLET | Freq: Every day | ORAL | Status: DC

## 2021-05-12 MED ORDER — THIAMINE HCL 100 MG PO TABS
100.0000 mg | ORAL_TABLET | Freq: Every day | ORAL | 0 refills | Status: DC
  Filled 2021-05-12: qty 30, 30d supply, fill #0

## 2021-05-12 MED ORDER — SACUBITRIL-VALSARTAN 24-26 MG PO TABS
1.0000 | ORAL_TABLET | Freq: Two times a day (BID) | ORAL | 0 refills | Status: DC
  Filled 2021-05-12: qty 60, 30d supply, fill #0

## 2021-05-12 NOTE — Progress Notes (Addendum)
Progress Note  Patient Name: [name] Date of Encounter: 05/12/2021  [facility] Cardiologist: Tyrone Casino, MD   Subjective   Denies any chest pain, dyspnea, or palpitations.  [name] about discharge today  Inpatient Medications    Scheduled Meds:  apixaban  5 mg Oral BID   carvedilol  25 mg Oral BID   darifenacin  7.5 mg Oral Daily   gabapentin  100 mg Oral TID   insulin aspart  0-15 Units Subcutaneous TID WC   metFORMIN  1,000 mg Oral Q breakfast   pantoprazole  40 mg Oral Daily   potassium chloride SA  40 mEq Oral Daily   sacubitril-valsartan  1 tablet Oral BID   sodium chloride flush  3 mL Intravenous Q12H   tamsulosin  0.4 mg Oral Daily   Continuous Infusions:  sodium chloride     PRN Meds: sodium chloride, acetaminophen, LORazepam, metoprolol tartrate, ondansetron (ZOFRAN) IV, sodium chloride flush   Vital Signs    Vitals:   05/11/21 1957 05/12/21 0016 05/12/21 0443 05/12/21 0804  BP: 90/67 107/82 122/90 (!) 126/93  Pulse: 89 88 98 (!) 114  Resp: 20 19 16  (!) 21  Temp: (!) 97.5 F (36.4 C) 97.7 F (36.5 C) 97.7 F (36.5 C) 97.6 F (36.4 C)  TempSrc: Axillary Oral Oral Oral  SpO2: 95% 97% 98% 98%  Weight:   95.8 kg   Height:        Intake/Output Summary (Last 24 hours) at 05/12/2021 1001 Last data filed at 05/12/2021 0447 Gross per 24 hour  Intake --  Output 1100 ml  Net -1100 ml   Last 3 Weights 05/12/2021 05/11/2021 03/24/2021  Weight (lbs) 211 lb 3.2 oz 205 lb 220 lb 6.4 oz  Weight (kg) 95.8 kg 92.987 kg 99.973 kg      Telemetry    Periods of 2-1 atrial flutter in 130s, also 1 A. fib in 90s to 120s- Personally Reviewed  ECG    No new EKG- Personally Reviewed  Physical Exam   GEN: No acute distress.   Neck: No JVD Cardiac: Irregular, tachycardic no murmurs, rubs, or gallops.  Respiratory: Clear to auscultation bilaterally. GI: Soft, nontender, non-distended  MS: No edema; No deformity. Neuro:  Nonfocal  Psych:  Normal affect   Labs    High Sensitivity Troponin:   Recent Labs  Lab 05/11/21 0619  TROPONINIHS 4     Chemistry Recent Labs  Lab 05/11/21 0619 05/12/21 0335  NA 137 137  K 4.1 3.6  CL 104 106  CO2 23 24  GLUCOSE 124* 100*  BUN 20 17  CREATININE 0.83 0.86  CALCIUM 8.6* 8.6*  GFRNONAA >60 >60  ANIONGAP 10 7    Lipids No results for input(s): CHOL, TRIG, HDL, LABVLDL, LDLCALC, CHOLHDL in the last 168 hours.  Hematology Recent Labs  Lab 05/11/21 0619  WBC 5.4  RBC 4.95  HGB 14.8  HCT 43.4  MCV 87.7  MCH 29.9  MCHC 34.1  RDW 13.6  PLT 176   Thyroid  Recent Labs  Lab 05/11/21 1529  TSH 1.958    BNPNo results for input(s): BNP, PROBNP in the last 168 hours.  DDimer No results for input(s): DDIMER in the last 168 hours.   Radiology    DG Chest 1 View  Result Date: 05/11/2021 CLINICAL DATA:  HF EXAM: CHEST  1 VIEW COMPARISON:  X-ray dated January FINDINGS: Cardiac mediastinal. Low lung volumes with hypoventilatory changes. Bibasilar opacities which are likely  atelectasis. No focal consolidation. No large pleural effusion or pneumothorax. IMPRESSION: No active disease. Electronically Signed   By: [name] M.D.   On: 05/11/2021 12:50    Cardiac Studies     Patient Profile     80 y.o. male with a hx of nonischemic cardiomyopathy, chronic systolic CHF, right BKA, CAD, hypertension, hyperlipidemia and DM2.  Patient was initially found to have low EF of 45 to 50% in 2017 who is being seen 05/11/2021 for the evaluation of atrial flutter with RVR at the request of Dr. Roosevelt Roberts.  Assessment & Plan    Atrial fibrillation/flutter with RVR of unknown duration: Last normal EKG was on 10/11/2020.  Patient denies any chest pain, palpitation, shortness of breath, fatigue or dizziness.  -Continue Eliquis 5 mg BID -I reviewed echocardiogram, EF appears down, about 35 to 40%.  Recommend TEE/DCCV to restore sinus rhythm.  Unfortunately this would not be until Monday.  I  discussed this with [name] and he is [name] about going home today. -Rates remain elevated, he is on carvedilol 25 mg twice daily.  Would not add diltiazem given his low EF.  Would not start amiodarone as we do not know if he has LAA thrombus yet.  Recommend starting digoxin.  Discussed with pharmacy and will give loading dose of IV digoxin 0.25 mg today and start p.o. 0.125 mg daily tomorrow.  Would check digoxin level at follow-up appointment next week.  We will schedule in A. fib clinic next week.  CAD: Denies any recent chest pain.  Cath 05/2018 with 70% small OM branch, otherwise nonobstructive CAD.  He is not on statin, would recommend starting statin as outpatient (he does not wish to start at this time given multiple new medications)   Nonischemic cardiomyopathy: Euvolemic on exam.  He is on Entresto and carvedilol.  Hold entresto for now due to soft BP.  Echo shows EF has decreased, suspect tachycardia induced cardiomyopathy as above.   [facility] will sign off.   Medication Recommendations: Eliquis 5 mg twice daily, carvedilol 25 mg twice daily, digoxin 0.125 mg daily.  Hold entresto for now due to soft BP Other recommendations (labs, testing, etc):  Check digoxin level at f/u in Afib clinic next week Follow up as an outpatient: Scheduled in Afib clinic on 12/22 at 10:30.  We will schedule follow-up with [name] in 1 month   For questions or updates, please contact [name] Please consult [email or web address] for contact info under        Signed, Tyrone Heinz, MD  05/12/2021, 10:01 AM

## 2021-05-12 NOTE — Progress Notes (Signed)
Heart Failure Nurse Navigator Progress Note  Pt wanting to leave today. Spoke with Cardiology, arranged plan for AF clinic next week, then [place] clinic 12/28 @ 3pm.   Confirmed appt and transportation with patient.   Pricilla Holm, MSN, RN Heart Failure Nurse Navigator [phone]

## 2021-05-12 NOTE — Care Management (Addendum)
0900 05-02-21 Case Manager spoke with patient and he is from home alone. Patient states he manages well with his prosthesis. Patient has a wheelchair, cane, and rolling walker. Patient states he still drives and has hand controls for use. Patient is asking if he can transition home today; states he will have transportation. No further home needs identified at this time.  2956 05-12-21 Case Manager discussed home health for wound care for the patient. Patient declined services and thinks he will be able to change the dressing at home. Patient demonstrated to Case Manager that he can cross his legs and get to his wound. Physical Therapy to work with the patient for disposition recommendations.   1153 05-12-21 Case Manager received PT recommendations-no need for [place] at this time. Eliquis card provided to the patients daughter. No further needs from Case Manager at this time.

## 2021-05-12 NOTE — Progress Notes (Signed)
°  Echocardiogram 2D Echocardiogram has been performed.  [name] 05/12/2021, 8:58 AM

## 2021-05-12 NOTE — Consult Note (Signed)
° °  [facility] Inpatient Consult   05/12/2021  [name] 1940-09-02 [id number]  [facility] [ACO] Patient: Medicare CMS DCE  Primary Care Provider:  Vivi Barrack, MD, [place] is an embedded provider with a Chronic Care Management team and program, and is listed for the transition of care follow up and appointments.  Patient was screened for Embedded practice service needs for chronic care management is active with Embedded Pharmacist prior to admission  Plan: Assess for no additional post hospital needs at this time. Patient transitioned home.  Please contact for further questions,  Natividad Brood, RN BSN [facility] Liaison  [phone] business mobile phone Toll free office [phone]  Fax number: [phone] [email or web address]

## 2021-05-12 NOTE — Progress Notes (Signed)
Discharge instructions provided with teach back method. Pt verbalized understanding but remains skeptical. Daughter received instructions as well. Awaiting on pharmacy for medications and volunteer for transport.

## 2021-05-12 NOTE — Discharge Instructions (Signed)

## 2021-05-12 NOTE — Progress Notes (Signed)
Pt adamant to go home and refusing all new medications. He says his vitals are "in the ballpark of where they need to be" and that he's "gonna change doctors anyways". Attempted to reducate Pt and his daughter bedside. Pt receptive to RN but angry at medical team. He thinks they're pushing medications and test on him. Attending, Cardiology & heart failure aware. Preparing Pt for discharge.

## 2021-05-12 NOTE — Discharge Summary (Signed)
Physician Discharge Summary  ECTOR LAUREL CWC:[id number] DOB: Jan 31, 1941 DOA: 05/11/2021  PCP: Vivi Barrack, MD  Admit date: 05/11/2021 Discharge date: 05/12/2021  Admitted From: Home Disposition:  Home  Discharge Condition:Stable CODE STATUS:FULL Diet recommendation: Heart Healthy   Brief/Interim Summary:  HPI: [name] is a 80 y.o. male with medical history significant of chronic systolic CHF LVEF 76-16%, HTN, IIDM, HLD, paroxysmal SVT, PVD status post right BKA, BPH, presented with uncontrolled A. fib.  Patient developed left midfoot callus, which he felt itchy and tried to remove the callus at home himself.  When he did last night, it caused significant bleeding despite self bandage at home.  He came to ED and when it was found the patient had A. fib with RVR.  Patient however denied any chest pain shortness of breath or palpitations.   ED Course: Patient was found to have A. fib with RVR, Cardizem bolus and drip started in the ED. K=4.1.  Hospital course:  Cardiology was consulted after admission.  Wound care nurse consulted for left foot wound.  No evidence of infection.  He was still in A. fib with heart rate in the range of 110-120/min, this morning.  He was started on Eliquis, digoxin.  We discussed about importance of staying 1 more night in the hospital for close monitoring and further planning for his A. fib.  Patient does not want to stay and adamant to leave [place] in case we do not discharge him.  After conversations with the patient and cardiology, we decided to discharge him today with current medications.  He will follow-up with cardiology as an outpatient.  We have requested him to quit alcohol.  He has an appointment with cardiology clinic on 12/22.I called his daughter twice for update, she did not pick the phone.  Following problems were addressed during his hospitalization:   Atrial flutter with RVR -Persistent, although it was found last  night, the actual duration is unknown given the patient himself was completely asymptomatic. -CHADS2=3, no significant history of GI bleed, no history of stroke or intracranial bleed, started on Eliquis.  -Continue  Coreg 25 mg twice daily, started on digoxin after given an IV dose   Left foot ulcer -Discussed with wound care at bedside, no signs of infection, continue wound care, recommend outpatient podiatry follow-up -We recommend to follow-up with home health RN   Chronic systolic CHF -Euvolemic -Continue Coreg and Entresto.   IIDM -Continue metformin and glimepiride   BPH -Continue Flomax  Chronic alcohol abuse Everyday drinker.  Counseled for cessation.  Started on thiamine /folic acid   Discharge Diagnoses:  Principal Problem:   Atrial fibrillation with RVR ([place]) Active Problems:   A-fib (HCC)   Atrial flutter with rapid ventricular response [facility])    Discharge Instructions  Discharge Instructions     Diet - low sodium heart healthy   Complete by: As directed    Discharge instructions   Complete by: As directed    1)Please take prescribed medications as instructed 2)Follow up with A. fib clinic,you have an appointment  on 05/18/2021 at 10:30 am. 3)Follow up with your PCP in a week. 4)Follow up with podiatry as an outpatient in 1 to 2 weeks.  Name and number the provider has been attached. 5)Please quit alcohol   Discharge wound care:   Complete by: As directed    As per wound care RN   Increase activity slowly   Complete by: As directed  Allergies as of 05/12/2021       Reactions   Lisinopril Cough        Medication List     STOP taking these medications    aspirin 325 MG tablet   aspirin-sod bicarb-citric acid 325 MG Tbef tablet Commonly known as: ALKA-SELTZER   doxycycline 100 MG tablet Commonly known as: VIBRA-TABS   Entresto 49-51 MG Generic drug: sacubitril-valsartan Replaced by: sacubitril-valsartan 24-26 MG   LORazepam 0.5  MG tablet Commonly known as: ATIVAN   metroNIDAZOLE 0.75 % cream Commonly known as: METROCREAM   tadalafil 10 MG tablet Commonly known as: Cialis       TAKE these medications    Accu-Chek Aviva Plus test strip Generic drug: glucose blood USE TO TEST GLUCOSE ONCE DAILY E11.9   Accu-Chek FastClix Lancets Misc USE TO CHECK BLOOD SUGAR DAILY AND AS NEEDED   apixaban 5 MG Tabs tablet Commonly known as: ELIQUIS Take 1 tablet (5 mg total) by mouth 2 (two) times daily.   carvedilol 25 MG tablet Commonly known as: COREG TAKE 1 TABLET BY MOUTH TWICE A DAY What changed: when to take this   digoxin 0.125 MG tablet Commonly known as: LANOXIN Take 1 tablet (0.125 mg total) by mouth daily. Start taking on: May 13, 2021   furosemide 20 MG tablet Commonly known as: LASIX TAKE 1 TABLET (20 MG TOTAL) BY MOUTH DAILY AS NEEDED FOR EDEMA.   gabapentin 100 MG capsule Commonly known as: NEURONTIN TAKE 1 CAPSULE BY MOUTH THREE TIMES A DAY What changed: See the new instructions.   halobetasol 0.05 % cream Commonly known as: ULTRAVATE APPLY TO AFFECTED AREA TWICE A DAY What changed: See the new instructions.   metFORMIN 1000 MG tablet Commonly known as: GLUCOPHAGE TAKE 1 TABLET BY MOUTH EVERY DAY WITH BREAKFAST What changed: See the new instructions.   nystatin cream Commonly known as: MYCOSTATIN APPLY TO AFFECTED AREA TWICE A DAY What changed:  how much to take how to take this when to take this additional instructions   pantoprazole 40 MG tablet Commonly known as: PROTONIX TAKE 1 TABLET BY MOUTH EVERY DAY   potassium chloride SA 20 MEQ tablet Commonly known as: Klor-Con M20 TAKE 2 TABLETS BY MOUTH EVERY MORNING What changed:  how much to take how to take this when to take this additional instructions   sacubitril-valsartan 24-26 MG Commonly known as: ENTRESTO Take 1 tablet by mouth 2 (two) times daily. Replaces: Entresto 49-51 MG   sildenafil 100 MG  tablet Commonly known as: VIAGRA TAKE 1 TABLET DAILY AS NEEDED ED What changed: See the new instructions.   solifenacin 10 MG tablet Commonly known as: VESICARE Take 10 mg by mouth daily.   tamsulosin 0.4 MG Caps capsule Commonly known as: FLOMAX TAKE 1 CAPSULE BY MOUTH EVERY DAY               Discharge Care Instructions  (From admission, onward)           Start     Ordered   05/12/21 0000  Discharge wound care:       Comments: As per wound care RN   05/12/21 1152            Follow-up Information     Vivi Barrack, MD. Schedule an appointment as soon as possible for a visit in 1 week(s).   Specialty: Family Medicine Contact information: [address] [place] [address] [phone]  Pixie Casino, MD .   Specialty: Cardiology Contact information: [address] [place] [address] [phone]                Allergies  Allergen Reactions   Lisinopril Cough    Consultations: cardiology   Procedures/Studies: DG Chest 1 View  Result Date: 05/11/2021 CLINICAL DATA:  HF EXAM: CHEST  1 VIEW COMPARISON:  X-ray dated January FINDINGS: Cardiac mediastinal. Low lung volumes with hypoventilatory changes. Bibasilar opacities which are likely atelectasis. No focal consolidation. No large pleural effusion or pneumothorax. IMPRESSION: No active disease. Electronically Signed   By: Yetta Glassman M.D.   On: 05/11/2021 12:50      Subjective:  Patient seen and examined at the bedside this morning.  On A. fib as per telemetry monitoring.  Asymptomatic.  Sitting on the chair.  Getting ready to go home.  Says he will not stop and says he needs to home at any cost.  Further conversation was not helpful   Discharge Exam: Vitals:   05/12/21 0804 05/12/21 1142  BP: (!) 126/93 (!) 86/54  Pulse: (!) 114 (!) 109  Resp: (!) 21 19  Temp: 97.6 F (36.4 C) (!) 97.3 F (36.3 C)  SpO2: 98% 94%   Vitals:   05/12/21 0016  05/12/21 0443 05/12/21 0804 05/12/21 1142  BP: 107/82 122/90 (!) 126/93 (!) 86/54  Pulse: 88 98 (!) 114 (!) 109  Resp: 19 16 (!) 21 19  Temp: 97.7 F (36.5 C) 97.7 F (36.5 C) 97.6 F (36.4 C) (!) 97.3 F (36.3 C)  TempSrc: Oral Oral Oral Oral  SpO2: 97% 98% 98% 94%  Weight:  95.8 kg    Height:        General: Pt is alert, awake, not in acute distress Cardiovascular: Irregularly irregular rhythm +, no rubs, no gallops Respiratory: CTA bilaterally, no wheezing, no rhonchi Abdominal: Soft, NT, ND, bowel sounds + Extremities: Right lower extremity prosthesis, clean ulcer on the left foot    The results of significant diagnostics from this hospitalization (including imaging, microbiology, ancillary and laboratory) are listed below for reference.     Microbiology: Recent Results (from the past 240 hour(s))  Resp Panel by RT-PCR (Flu A&B, Covid) Nasopharyngeal Swab     Status: None   Collection Time: 05/11/21  6:50 AM   Specimen: Nasopharyngeal Swab; Nasopharyngeal(NP) swabs in vial transport medium  Result Value Ref Range Status   SARS Coronavirus 2 by RT PCR NEGATIVE NEGATIVE Final    Comment: (NOTE) SARS-CoV-2 target nucleic acids are NOT DETECTED.  The SARS-CoV-2 RNA is generally detectable in upper respiratory specimens during the acute phase of infection. The lowest concentration of SARS-CoV-2 viral copies this assay can detect is 138 copies/mL. A negative result does not preclude SARS-Cov-2 infection and should not be used as the sole basis for treatment or other patient management decisions. A negative result may occur with  improper specimen collection/handling, submission of specimen other than nasopharyngeal swab, presence of viral mutation(s) within the areas targeted by this assay, and inadequate number of viral copies(<138 copies/mL). A negative result must be combined with clinical observations, patient history, and epidemiological information. The expected  result is Negative.  Fact Sheet for Patients:  EntrepreneurPulse.com.au  Fact Sheet for Healthcare Providers:  IncredibleEmployment.be  This test is no t yet approved or cleared by the [place] FDA and  has been authorized for detection and/or diagnosis of SARS-CoV-2 by FDA under an Emergency Use Authorization (EUA).  This EUA will remain  in effect (meaning this test can be used) for the duration of the COVID-19 declaration under Section 564(b)(1) of the Act, 21 U.S.C.section 360bbb-3(b)(1), unless the authorization is terminated  or revoked sooner.       Influenza A by PCR NEGATIVE NEGATIVE Final   Influenza B by PCR NEGATIVE NEGATIVE Final    Comment: (NOTE) The Xpert Xpress SARS-CoV-2/FLU/RSV plus assay is intended as an aid in the diagnosis of influenza from Nasopharyngeal swab specimens and should not be used as a sole basis for treatment. Nasal washings and aspirates are unacceptable for Xpert Xpress SARS-CoV-2/FLU/RSV testing.  Fact Sheet for Patients: EntrepreneurPulse.com.au  Fact Sheet for Healthcare Providers: IncredibleEmployment.be  This test is not yet approved or cleared by the [place] FDA and has been authorized for detection and/or diagnosis of SARS-CoV-2 by FDA under an Emergency Use Authorization (EUA). This EUA will remain in effect (meaning this test can be used) for the duration of the COVID-19 declaration under Section 564(b)(1) of the Act, 21 U.S.C. section 360bbb-3(b)(1), unless the authorization is terminated or revoked.  Performed at [facility], [address] [place], [place] [address]      Labs: BNP (last 3 results) No results for input(s): BNP in the last 8760 hours. Basic Metabolic Panel: Recent Labs  Lab 05/11/21 0619 05/12/21 0335  NA 137 137  K 4.1 3.6  CL 104 106  CO2 23 24  GLUCOSE 124* 100*  BUN 20 17  CREATININE 0.83 0.86   CALCIUM 8.6* 8.6*   Liver Function Tests: No results for input(s): AST, ALT, ALKPHOS, BILITOT, PROT, ALBUMIN in the last 168 hours. No results for input(s): LIPASE, AMYLASE in the last 168 hours. No results for input(s): AMMONIA in the last 168 hours. CBC: Recent Labs  Lab 05/11/21 0619  WBC 5.4  NEUTROABS 3.4  HGB 14.8  HCT 43.4  MCV 87.7  PLT 176   Cardiac Enzymes: No results for input(s): CKTOTAL, CKMB, CKMBINDEX, TROPONINI in the last 168 hours. BNP: Invalid input(s): POCBNP CBG: Recent Labs  Lab 05/11/21 1222 05/11/21 1824 05/11/21 2109 05/12/21 0746 05/12/21 1142  GLUCAP 102* 119* 113* 128* 121*   D-Dimer No results for input(s): DDIMER in the last 72 hours. Hgb A1c Recent Labs    05/11/21 0619  HGBA1C 5.9*   Lipid Profile No results for input(s): CHOL, HDL, LDLCALC, TRIG, CHOLHDL, LDLDIRECT in the last 72 hours. Thyroid function studies Recent Labs    05/11/21 1529  TSH 1.958   Anemia work up No results for input(s): VITAMINB12, FOLATE, FERRITIN, TIBC, IRON, RETICCTPCT in the last 72 hours. Urinalysis    Component Value Date/Time   COLORURINE YELLOW 12/10/2015 1018   APPEARANCEUR CLEAR 12/10/2015 1018   LABSPEC 1.018 12/10/2015 1018   PHURINE 5.5 12/10/2015 1018   GLUCOSEU NEGATIVE 12/10/2015 1018   HGBUR SMALL (A) 12/10/2015 1018   HGBUR negative 10/11/2009 0000   BILIRUBINUR Negative 01/27/2019 1412   KETONESUR NEGATIVE 12/10/2015 1018   PROTEINUR Negative 01/27/2019 1412   PROTEINUR NEGATIVE 12/10/2015 1018   UROBILINOGEN 0.2 01/27/2019 1412   UROBILINOGEN 0.2 08/04/2014 2335   NITRITE Negative 01/27/2019 1412   NITRITE NEGATIVE 12/10/2015 1018   LEUKOCYTESUR Negative 01/27/2019 1412   Sepsis Labs Invalid input(s): PROCALCITONIN,  WBC,  LACTICIDVEN Microbiology Recent Results (from the past 240 hour(s))  Resp Panel by RT-PCR (Flu A&B, Covid) Nasopharyngeal Swab     Status: None   Collection Time: 05/11/21  6:50 AM  Specimen:  Nasopharyngeal Swab; Nasopharyngeal(NP) swabs in vial transport medium  Result Value Ref Range Status   SARS Coronavirus 2 by RT PCR NEGATIVE NEGATIVE Final    Comment: (NOTE) SARS-CoV-2 target nucleic acids are NOT DETECTED.  The SARS-CoV-2 RNA is generally detectable in upper respiratory specimens during the acute phase of infection. The lowest concentration of SARS-CoV-2 viral copies this assay can detect is 138 copies/mL. A negative result does not preclude SARS-Cov-2 infection and should not be used as the sole basis for treatment or other patient management decisions. A negative result may occur with  improper specimen collection/handling, submission of specimen other than nasopharyngeal swab, presence of viral mutation(s) within the areas targeted by this assay, and inadequate number of viral copies(<138 copies/mL). A negative result must be combined with clinical observations, patient history, and epidemiological information. The expected result is Negative.  Fact Sheet for Patients:  EntrepreneurPulse.com.au  Fact Sheet for Healthcare Providers:  IncredibleEmployment.be  This test is no t yet approved or cleared by the [place] FDA and  has been authorized for detection and/or diagnosis of SARS-CoV-2 by FDA under an Emergency Use Authorization (EUA). This EUA will remain  in effect (meaning this test can be used) for the duration of the COVID-19 declaration under Section 564(b)(1) of the Act, 21 U.S.C.section 360bbb-3(b)(1), unless the authorization is terminated  or revoked sooner.       Influenza A by PCR NEGATIVE NEGATIVE Final   Influenza B by PCR NEGATIVE NEGATIVE Final    Comment: (NOTE) The Xpert Xpress SARS-CoV-2/FLU/RSV plus assay is intended as an aid in the diagnosis of influenza from Nasopharyngeal swab specimens and should not be used as a sole basis for treatment. Nasal washings and aspirates are unacceptable for  Xpert Xpress SARS-CoV-2/FLU/RSV testing.  Fact Sheet for Patients: EntrepreneurPulse.com.au  Fact Sheet for Healthcare Providers: IncredibleEmployment.be  This test is not yet approved or cleared by the [place] FDA and has been authorized for detection and/or diagnosis of SARS-CoV-2 by FDA under an Emergency Use Authorization (EUA). This EUA will remain in effect (meaning this test can be used) for the duration of the COVID-19 declaration under Section 564(b)(1) of the Act, 21 U.S.C. section 360bbb-3(b)(1), unless the authorization is terminated or revoked.  Performed at [facility], [address] [place], [place] [address]     Please note: You were cared for by a hospitalist during your hospital stay. Once you are discharged, your primary care physician will handle any further medical issues. Please note that NO REFILLS for any discharge medications will be authorized once you are discharged, as it is imperative that you return to your primary care physician (or establish a relationship with a primary care physician if you do not have one) for your post hospital discharge needs so that they can reassess your need for medications and monitor your lab values.    Time coordinating discharge: 40 minutes  SIGNED:   Shelly Coss, MD  [facility] 05/12/2021, 11:52 AM Pager [phone]  If 7PM-7AM, please contact night-coverage [email or web address] Password [email or web address]

## 2021-05-12 NOTE — Care Management Obs Status (Signed)
[place] NOTIFICATION   Patient Details  Name: [name] MRN: [id number] Date of Birth: 05-Oct-1940   Medicare Observation Status Notification Given:  Yes    Bethena Roys, RN 05/12/2021, 8:59 AM

## 2021-05-12 NOTE — Progress Notes (Signed)
Pt's HR fluctuating 110s to 140s. Muse yellow & red. Attending & Cardiology aware. Pt refusing treatment. Plan for outpatient cardiology follow-up. Discharge in process.

## 2021-05-12 NOTE — Evaluation (Signed)
Physical Therapy Evaluation Patient Details Name: [name] MRN: [id number] DOB: [birth year] Today's Date: 05/12/2021  History of Present Illness  80 y.o. male presented with left plantar foot callus which he removed at home and developed significant bleeding, Found to be in Afib with RVR and admitted for observation 05/11/21. YBW:LSLHTDS systolic CHF LVEF 28-76%, HTN, IIDM, HLD, paroxysmal SVT, PVD status post right BKA, BPH.  Clinical Impression  PTA pt living alone in town home, with level entry. Pt reports independence with mobility, donning his prosthetic first thing in the morning and mobilizing with SPC or RW, uses manual wheelchair when prosthetic doffed. Pt utilizes car with hand controls for driving. Pt completely independent in ADLs. Currently, upset because he needs to get home to pay his bills. Pt has poor safety awareness as his HR sustained in 130s during session, RN attempting to manage with medication and pt with decreased understanding of his situation. Pt mobility at a min guard level for safety, however pt is likely at his baseline and refuses HHPT services. Pt would benefit from his daughter checking in on him more regularly. Pt will likely leave today, however PT will continue to follow acutely if he stays.     Recommendations for follow up therapy are one component of a multi-disciplinary discharge planning process, led by the attending physician.  Recommendations may be updated based on patient status, additional functional criteria and insurance authorization.  Follow Up Recommendations No PT follow up    Assistance Recommended at Discharge PRN  Functional Status Assessment Patient has had a recent decline in their functional status and demonstrates the ability to make significant improvements in function in a reasonable and predictable amount of time.  Equipment Recommendations  None recommended by PT    Recommendations for Other Services       Precautions /  Restrictions Precautions Precautions: Fall Restrictions Weight Bearing Restrictions: No      Mobility  Bed Mobility               General bed mobility comments: sitting EoB on entry    Transfers Overall transfer level: Needs assistance Equipment used: Straight cane (R BK prosthetic) Transfers: Sit to/from Stand Sit to Stand: Min guard;From elevated surface           General transfer comment: pt able to don prosthetic with SPC and min guard for safety    Ambulation/Gait Ambulation/Gait assistance: Min guard Gait Distance (Feet): 8 Feet Assistive device: Straight cane Gait Pattern/deviations: Step-through pattern;Decreased weight shift to right;Decreased step length - right;Decreased step length - left Gait velocity: slowed Gait velocity interpretation: <1.31 ft/sec, indicative of household ambulator   General Gait Details: min guard for safety, good sequencing and steadying with SPC         Balance Overall balance assessment: Mild deficits observed, not formally tested                                           Pertinent Vitals/Pain Pain Assessment: Faces Faces Pain Scale: Hurts a little bit Pain Location: L foot with weightbearing Pain Descriptors / Indicators: Grimacing Pain Intervention(s): Limited activity within patient's tolerance;Monitored during session;Repositioned    Home Living Family/patient expects to be discharged to:: Private residence Living Arrangements: Alone Available Help at Discharge: Family;Available PRN/intermittently Type of Home: House Home Access: Level entry       Home Layout: One level  Home Equipment: Conservation officer, nature (2 wheels);Shower seat;Wheelchair - manual;Grab bars - toilet;Grab bars - tub/shower;Hand held shower head;Other (comment);Cane - single point (hand controls on car)      Prior Function Prior Level of Function : Independent/Modified Independent;Driving             Mobility Comments:  ambulate with prosthetic and cane or RW during day when prosthetic doffed uses W/c ADLs Comments: independent with        Extremity/Trunk Assessment   Upper Extremity Assessment Upper Extremity Assessment: Overall WFL for tasks assessed    Lower Extremity Assessment Lower Extremity Assessment: RLE deficits/detail;Generalized weakness RLE Deficits / Details: R BKA,       Communication   Communication: No difficulties  Cognition Arousal/Alertness: Awake/alert Behavior During Therapy: Restless (impatient, wants to go home to pay bills) Overall Cognitive Status: No family/caregiver present to determine baseline cognitive functioning Area of Impairment: Safety/judgement                         Safety/Judgement: Decreased awareness of deficits     General Comments: Despite showing pt that his HR is sustained in the 130s, pt does not understand gravity of situation, impatient with medical staff, continually commenting on "just keeping me here to make money"        General Comments General comments (skin integrity, edema, etc.): On entry HR sustained in 130s, RN providing metoprolol, continues to be sustained in 130s at end of session, RN providing cardezem drip, SpO2 on RA 94%O2,        Assessment/Plan    PT Assessment Patient needs continued PT services  PT Problem List Cardiopulmonary status limiting activity;Decreased activity tolerance;Decreased safety awareness       PT Treatment Interventions DME instruction;Gait training;Functional mobility training;Therapeutic activities;Therapeutic exercise;Balance training;Cognitive remediation;Patient/family education    PT Goals (Current goals can be found in the Care Plan section)  Acute Rehab PT Goals Patient Stated Goal: go home now PT Goal Formulation: With patient Time For Goal Achievement: 05/26/21 Potential to Achieve Goals: Fair    Frequency Min 3X/week   Barriers to discharge Decreased caregiver  support         AM-PAC PT "6 Clicks" Mobility  Outcome Measure Help needed turning from your back to your side while in a flat bed without using bedrails?: None Help needed moving from lying on your back to sitting on the side of a flat bed without using bedrails?: None Help needed moving to and from a bed to a chair (including a wheelchair)?: None Help needed standing up from a chair using your arms (e.g., wheelchair or bedside chair)?: None Help needed to walk in hospital room?: None Help needed climbing 3-5 steps with a railing? : A Lot 6 Click Score: 22    End of Session Equipment Utilized During Treatment: Gait belt Activity Tolerance: Treatment limited secondary to medical complications (Comment) (HR  sustained in 130s) Patient left: in chair;with call bell/phone within reach;with chair alarm set;with nursing/sitter in room Nurse Communication: Mobility status PT Visit Diagnosis: Muscle weakness (generalized) (M62.81)    Time: 5573-2202 PT Time Calculation (min) (ACUTE ONLY): 23 min   Charges:   PT Evaluation $PT Eval Moderate Complexity: 1 Mod PT Treatments $Therapeutic Activity: 8-22 mins        Dalen Hennessee B. Migdalia Dk PT, DPT Acute Rehabilitation Services Pager [phone] Office [phone]   [place] 05/12/2021, 9:51 AM

## 2021-05-12 NOTE — Progress Notes (Signed)
PT Cancellation Note  Patient Details Name: [name] MRN: [id number] DOB: Apr 06, 1941   Cancelled Treatment:    Reason Eval/Treat Not Completed: (P) Patient at procedure or test/unavailable Pt having ECHO done. PT will follow back for Evaluation later this morning as able.  Abu Heavin B. Migdalia Dk PT, DPT Acute Rehabilitation Services Pager [phone] Office [phone]    [place] 05/12/2021, 8:22 AM

## 2021-05-15 ENCOUNTER — Ambulatory Visit (INDEPENDENT_AMBULATORY_CARE_PROVIDER_SITE_OTHER): Payer: Medicare Other | Admitting: Family Medicine

## 2021-05-15 ENCOUNTER — Encounter: Payer: Self-pay | Admitting: Family Medicine

## 2021-05-15 VITALS — BP 88/64 | HR 102 | Temp 98.1°F | Ht 72.0 in | Wt 214.0 lb

## 2021-05-15 DIAGNOSIS — I4891 Unspecified atrial fibrillation: Secondary | ICD-10-CM

## 2021-05-15 DIAGNOSIS — F411 Generalized anxiety disorder: Secondary | ICD-10-CM

## 2021-05-15 DIAGNOSIS — I10 Essential (primary) hypertension: Secondary | ICD-10-CM | POA: Diagnosis not present

## 2021-05-15 DIAGNOSIS — L97521 Non-pressure chronic ulcer of other part of left foot limited to breakdown of skin: Secondary | ICD-10-CM

## 2021-05-15 DIAGNOSIS — F1090 Alcohol use, unspecified, uncomplicated: Secondary | ICD-10-CM | POA: Insufficient documentation

## 2021-05-15 DIAGNOSIS — Z789 Other specified health status: Secondary | ICD-10-CM

## 2021-05-15 DIAGNOSIS — E11621 Type 2 diabetes mellitus with foot ulcer: Secondary | ICD-10-CM | POA: Insufficient documentation

## 2021-05-15 DIAGNOSIS — I5022 Chronic systolic (congestive) heart failure: Secondary | ICD-10-CM

## 2021-05-15 NOTE — Assessment & Plan Note (Signed)
Follows with cardiology.  EF in the hospital was 40 to 45%.  He will continue current medication regimen per cardiology and follow-up with them in a couple of days.

## 2021-05-15 NOTE — Assessment & Plan Note (Signed)
Ventricular rate initially elevated to the 130s was 102 in recheck.  We reviewed his medication changes in the hospital.  Currently does not have any symptoms of atrial fibrillation.  He will continue taking digoxin and Eliquis.  Discussed importance of compliance with these medications to prevent stroke.  He voiced understanding.  He will follow-up with cardiology in a couple of days.

## 2021-05-15 NOTE — Assessment & Plan Note (Signed)
Worsened a little bit since being discharged however currently manageable.  He uses Ativan very sparingly as needed.  Does not need refill today.

## 2021-05-15 NOTE — Assessment & Plan Note (Signed)
On the lower side today however no symptoms.  His dose of Entresto was decreased in the hospital to 24-26 twice daily.  We will continue this as well as Coreg 25 mg twice daily.

## 2021-05-15 NOTE — Patient Instructions (Addendum)
It was very nice to see you today!  Please make sure you take the eliquis and and digoxin.  You don't need the thiamine or folic acid.   Please call [phone] to schedule an appointment with podiatry.   Please keep your follow-up visit with cardiology.  Take care, Dr Jerline Pain  PLEASE NOTE:  If you had any lab tests please let us know if you have not heard back within a few days. You may see your results on mychart before we have a chance to review them but we will give you a call once they are reviewed by [place]. If we ordered any referrals today, please let us know if you have not heard from their office within the next week.   Please try these tips to maintain a healthy lifestyle:  Eat at least 3 REAL meals and 1-2 snacks per day.  Aim for no more than 5 hours between eating.  If you eat breakfast, please do so within one hour of getting up.   Each meal should contain half fruits/vegetables, one quarter protein, and one quarter carbs (no bigger than a computer mouse)  Cut down on sweet beverages. This includes juice, soda, and sweet tea.   Drink at least 1 glass of water with each meal and aim for at least 8 glasses per day  Exercise at least 150 minutes every week.

## 2021-05-15 NOTE — Assessment & Plan Note (Signed)
No signs of infection.  Healthy granulation tissue present.  He has peripheral neuropathy.  He will follow-up with his podiatrist for ongoing wound care.  Gave contact information today.

## 2021-05-15 NOTE — Progress Notes (Signed)
Chief Complaint:  [name] is a 80 y.o. male who presents today for a TCM visit.  Assessment/Plan:  Chronic Problems Addressed Today: A-fib ([place]) Ventricular rate initially elevated to the 130s was 102 in recheck.  We reviewed his medication changes in the hospital.  Currently does not have any symptoms of atrial fibrillation.  He will continue taking digoxin and Eliquis.  Discussed importance of compliance with these medications to prevent stroke.  He voiced understanding.  He will follow-up with cardiology in a couple of days.  Chronic systolic heart failure ([place]) Follows with cardiology.  EF in the hospital was 40 to 45%.  He will continue current medication regimen per cardiology and follow-up with them in a couple of days.  Essential hypertension On the lower side today however no symptoms.  His dose of Entresto was decreased in the hospital to 24-26 twice daily.  We will continue this as well as Coreg 25 mg twice daily.  Anxiety state Worsened a little bit since being discharged however currently manageable.  He uses Ativan very sparingly as needed.  Does not need refill today.  Alcohol use Patient admits to drinking 1 or 2 drinks per night.  Denies any binge drinking episodes.  Discussed importance of cessation especially in regards to his atrial fibrillation.  Patient stated that he would stop completely.  We will take thiamine and folic acid office medication list - do not think he needs these.   Ulcer of left foot, limited to breakdown of skin ([place]) No signs of infection.  Healthy granulation tissue present.  He has peripheral neuropathy.  He will follow-up with his podiatrist for ongoing wound care.  Gave contact information today.    Subjective:  HPI:  Summary of Hospital admission: Reason for admission: Afib with RVR Date of admission: 05/11/2021 Date of discharge: 05/12/2021 Date of Interactive contact: N/A.  Today's visit was within 2 days of  discharge Summary of Hospital course: Patient presented to the ED on 05/11/2021 with wound on his left foot after attempting to remove a callus on the bottom of his foot.  Went to the ED for further evaluation.  While in the ED was found to have A. fib with RVR.  Cardiology was consulted and he was admitted.  While hospitalized he was started on Eliquis and digoxin.  He was adamant that he wanted to leave the hospital and was discharged on hospital day 2 [place].  Interim history:   He has done well since being home. No palpitations. No chest pain or shortness of breath. HE has not had any dizziness.  He will be following up with cardiology in a few days.  Patient states he did not know that he had any heart arrhythmias and that his primary concern is foot ulcer  Regarding his foot ulcer he is still concerned.  He has been keeping the area clean and dry and bandaged.  No pain.  No drainage.  No redness.  He has been using topical antibiotic ointment.  ROS: Per HPI, otherwise a complete review of systems was negative.   PMH:  The following were reviewed and entered/updated in epic: Past Medical History:  Diagnosis Date   CAD (coronary artery disease)    a. Remote cath 2008 for chest pain showed 30% LAD with luminal irregularity and fairly heavy calcification in the mid LAD without critical stenosis, 30% OM1, 30% acute marginal.   Cancer (HCC)    skin cancer - basal  Cataract    both   Chronic combined systolic and diastolic CHF (congestive heart failure) (HCC)    Complication of anesthesia    Diabetes mellitus    Type II   ED (erectile dysfunction)    History of kidney stones    x1   Hypertension    NICM (nonischemic cardiomyopathy) ([place])    a. EF 40-45% by echo in 08/2017 - prior low risk stress test in 2017.   Osteoarthritis    Paroxysmal SVT (supraventricular tachycardia) (HCC)    PONV (postoperative nausea and vomiting) 1960   with Ether   PVC's (premature  ventricular contractions)    PVD (peripheral vascular disease) (HCC)    s/p R BKA   Rheumatic fever    Rhinitis    S/P BKA (below knee amputation), right [facility])    Patient Active Problem List   Diagnosis Date Noted   Ulcer of left foot, limited to breakdown of skin ([place]) 05/15/2021   Alcohol use 05/15/2021   A-fib ([place]) 05/11/2021   Gastroesophageal reflux disease 12/08/2019   Dermatitis 03/10/2018   Benign prostatic hyperplasia with nocturia 11/12/2017   Nonischemic cardiomyopathy ([place]) 09/03/2017   AVNRT (AV nodal re-entry tachycardia) ([place]) 01/30/2017   Chronic midline low back pain without sciatica 01/03/2017   H/O amputation of leg through tibia and fibula, right ([place]) 05/03/2016   Phantom limb pain ([place]) 03/06/2016   SVT (supraventricular tachycardia) ([place]) 35/32/9924   Chronic systolic heart failure ([place]) 01/20/2016   Diabetic peripheral neuropathy ([place]) 08/23/2015   Esophageal dysphagia 08/06/2014   Anxiety state 10/26/2013   Hyperglycemia 11/02/2010   BASAL CELL CARCINOMA, FACE 10/18/2008   LIPOMA, SKIN 05/31/2008   ERECTILE DYSFUNCTION 09/30/2007   OSTEOARTHRITIS 08/08/2007   Essential hypertension 03/24/2007   Past Surgical History:  Procedure Laterality Date   AMPUTATION Right 02/29/2016   Procedure: Right Leg AMPUTATION BELOW KNEE with Wound Vac placement;  Surgeon: [name], MD;  Location: [facility];  Service: Orthopedics;  Laterality: Right;   CHOLECYSTECTOMY  1983   colonoscopy  2006, 2010   EYE SURGERY Bilateral    cataract   INGUINAL HERNIA REPAIR Right 1960   LEFT HEART CATH AND CORONARY ANGIOGRAPHY N/A 06/03/2018   Procedure: LEFT HEART CATH AND CORONARY ANGIOGRAPHY;  Surgeon: Belva Crome, MD;  Location: [facility];  Service: Cardiovascular;  Laterality: N/A;   STUMP REVISION Right 10/30/2017   Procedure: RIGHT BELOW KNEE AMPUTATION REVISION;  Surgeon: [name], MD;  Location: [facility];  Service: Orthopedics;  Laterality: Right;    Family  History  Problem Relation Age of Onset   Stroke Father 33       Died age 82   Coronary artery disease Brother 43   Colon cancer Neg Hx    Colon polyps Neg Hx    Diabetes Neg Hx    Esophageal cancer Neg Hx    Kidney disease Neg Hx    Gallbladder disease Neg Hx     Medications- Reconciled discharge and current medications in Epic.  Current Outpatient Medications  Medication Sig Dispense Refill   ACCU-CHEK AVIVA PLUS test strip USE TO TEST GLUCOSE ONCE DAILY E11.9 100 strip 11   Accu-Chek FastClix Lancets MISC USE TO CHECK BLOOD SUGAR DAILY AND AS NEEDED 102 each 4   apixaban (ELIQUIS) 5 MG TABS tablet Take 1 tablet (5 mg total) by mouth 2 (two) times daily. 60 tablet 0   carvedilol (COREG) 25 MG tablet TAKE 1 TABLET BY MOUTH  TWICE A DAY (Patient taking differently: Take 25 mg by mouth in the morning and at bedtime.) 180 tablet 3   digoxin (LANOXIN) 0.125 MG tablet Take 1 tablet (0.125 mg total) by mouth daily. 30 tablet 0   furosemide (LASIX) 20 MG tablet TAKE 1 TABLET (20 MG TOTAL) BY MOUTH DAILY AS NEEDED FOR EDEMA. 90 tablet 1   gabapentin (NEURONTIN) 100 MG capsule TAKE 1 CAPSULE BY MOUTH THREE TIMES A DAY (Patient taking differently: Take 100 mg by mouth 3 (three) times daily.) 270 capsule 3   halobetasol (ULTRAVATE) 0.05 % cream APPLY TO AFFECTED AREA TWICE A DAY (Patient taking differently: Apply 1 application topically in the morning and at bedtime.) 50 g 2   metFORMIN (GLUCOPHAGE) 1000 MG tablet TAKE 1 TABLET BY MOUTH EVERY DAY WITH BREAKFAST (Patient taking differently: Take 1,000 mg by mouth daily.) 90 tablet 1   nystatin cream (MYCOSTATIN) APPLY TO AFFECTED AREA TWICE A DAY (Patient taking differently: Apply 1 application topically 2 (two) times daily.) 30 g 2   pantoprazole (PROTONIX) 40 MG tablet TAKE 1 TABLET BY MOUTH EVERY DAY (Patient taking differently: Take 40 mg by mouth daily.) 90 tablet 1   potassium chloride SA (KLOR-CON M20) 20 MEQ tablet TAKE 2 TABLETS BY MOUTH EVERY  MORNING (Patient taking differently: Take 40 mEq by mouth daily.) 180 tablet 3   sacubitril-valsartan (ENTRESTO) 24-26 MG Take 1 tablet by mouth 2 (two) times daily. 60 tablet 0   sildenafil (VIAGRA) 100 MG tablet TAKE 1 TABLET DAILY AS NEEDED ED (Patient taking differently: Take 100 mg by mouth daily as needed for erectile dysfunction.) 4 tablet 22   solifenacin (VESICARE) 10 MG tablet Take 10 mg by mouth daily.     tamsulosin (FLOMAX) 0.4 MG CAPS capsule TAKE 1 CAPSULE BY MOUTH EVERY DAY (Patient taking differently: Take 0.4 mg by mouth daily.) 90 capsule 3   No current facility-administered medications for this visit.    Allergies-reviewed and updated Allergies  Allergen Reactions   Lisinopril Cough    Social History   Socioeconomic History   Marital status: Married    Spouse name: Not on file   Number of children: 1   Years of education: 16+   Highest education level: Not on file  Occupational History   Occupation: Curator - [employer]    Employer: RETIRED  Tobacco Use   Smoking status: Former    Years: 20.00    Types: Cigarettes    Quit date: 07/26/1981    Years since quitting: 39.8   Smokeless tobacco: Never  Vaping Use   Vaping Use: Never used  Substance and Sexual Activity   Alcohol use: Not Currently    Comment: occasional glass of wine   Drug use: No   Sexual activity: Not Currently  Other Topics Concern   Not on file  Social History Narrative   Lives with wife.   epworth sleepiness scale = 5 (01/20/16)   Social Determinants of Health   Financial Resource Strain: Medium Risk   Difficulty of Paying Living Expenses: Somewhat hard  Food Insecurity: No Food Insecurity   Worried About Charity fundraiser in the Last Year: Never true   Ran Out of Food in the Last Year: Never true  Transportation Needs: No Transportation Needs   Lack of Transportation (Medical): No   Lack of Transportation (Non-Medical): No  Physical Activity: Not on file   Stress: Not on file  Social Connections: Not on file  Objective:  Physical Exam: BP (!) 88/64 (BP Location: Left Arm)    Pulse (!) 102    Temp 98.1 F (36.7 C) (Temporal)    Ht 6' (1.829 m)    Wt 214 lb (97.1 kg)    SpO2 98%    BMI 29.02 kg/m   Gen: NAD, resting comfortably CV: Irregular with no murmurs appreciated Pulm: NWOB, CTAB with no crackles, wheezes, or rhonchi GI: Normal bowel sounds present. Soft, Nontender, Nondistended. MSK: No edema, cyanosis, or clubbing noted.  Approximately 1.5 open ulcer on bottom of left foot.  No surrounding erythema.  Granulation tissue present. Skin: Warm, dry Neuro: Grossly normal, moves all extremities Psych: Normal affect and thought content  Time Spent: 50 minutes of total time was spent on the date of the encounter performing the following actions: chart review prior to seeing the patient including his recent hospitalization, obtaining history, performing a medically necessary exam, counseling on the treatment plan, placing orders, and documenting in our EHR.       Algis Greenhouse. Jerline Pain, MD 05/15/2021 11:55 AM

## 2021-05-15 NOTE — Assessment & Plan Note (Signed)
Patient admits to drinking 1 or 2 drinks per night.  Denies any binge drinking episodes.  Discussed importance of cessation especially in regards to his atrial fibrillation.  Patient stated that he would stop completely.  We will take thiamine and folic acid office medication list - do not think he needs these.

## 2021-05-18 ENCOUNTER — Ambulatory Visit (HOSPITAL_COMMUNITY)
Admission: RE | Admit: 2021-05-18 | Discharge: 2021-05-18 | Disposition: A | Discharge status: Home / Self Care | Payer: Medicare Other | Source: Ambulatory Visit | Attending: Nurse Practitioner | Admitting: Nurse Practitioner

## 2021-05-18 ENCOUNTER — Encounter (HOSPITAL_COMMUNITY): Payer: Self-pay | Admitting: Nurse Practitioner

## 2021-05-18 ENCOUNTER — Other Ambulatory Visit: Payer: Self-pay

## 2021-05-18 VITALS — BP 114/62 | HR 106 | Ht 72.0 in | Wt 214.0 lb

## 2021-05-18 DIAGNOSIS — E1151 Type 2 diabetes mellitus with diabetic peripheral angiopathy without gangrene: Secondary | ICD-10-CM | POA: Diagnosis not present

## 2021-05-18 DIAGNOSIS — I4891 Unspecified atrial fibrillation: Secondary | ICD-10-CM | POA: Diagnosis not present

## 2021-05-18 DIAGNOSIS — I110 Hypertensive heart disease with heart failure: Secondary | ICD-10-CM | POA: Diagnosis not present

## 2021-05-18 DIAGNOSIS — Z7901 Long term (current) use of anticoagulants: Secondary | ICD-10-CM | POA: Insufficient documentation

## 2021-05-18 DIAGNOSIS — E785 Hyperlipidemia, unspecified: Secondary | ICD-10-CM | POA: Insufficient documentation

## 2021-05-18 DIAGNOSIS — Z87891 Personal history of nicotine dependence: Secondary | ICD-10-CM | POA: Diagnosis not present

## 2021-05-18 DIAGNOSIS — I5022 Chronic systolic (congestive) heart failure: Secondary | ICD-10-CM | POA: Insufficient documentation

## 2021-05-18 DIAGNOSIS — I4819 Other persistent atrial fibrillation: Secondary | ICD-10-CM

## 2021-05-18 DIAGNOSIS — D6869 Other thrombophilia: Secondary | ICD-10-CM

## 2021-05-18 DIAGNOSIS — Z89511 Acquired absence of right leg below knee: Secondary | ICD-10-CM | POA: Insufficient documentation

## 2021-05-18 NOTE — Patient Instructions (Signed)
Cardioversion scheduled for Thursday, January 12th  - Arrive at the [facility] and go to admitting at [facility] not eat or drink anything after midnight the night prior to your procedure.  - Take all your morning medication (except diabetic medications) with a sip of water prior to arrival.  - You will not be able to drive home after your procedure.  - Do NOT miss any doses of your blood thinner - if you should miss a dose please notify our office immediately.  - If you feel as if you go back into normal rhythm prior to scheduled cardioversion, please notify our office immediately. If your procedure is canceled in the cardioversion suite you will be charged a cancellation fee. Patients will be asked to: to mask in public and hand hygiene (no longer quarantine) in the 3 days prior to surgery, to report if any COVID-19-like illness or household contacts to COVID-19 to determine need for testing

## 2021-05-18 NOTE — Progress Notes (Signed)
Primary Care Physician: Tyrone Barrack, MD Referring Physician: [name], [place]  Cardiologist: Dr.    [name] is a 80 y.o. male with a h/o significant of chronic systolic CHF LVEF 18-56%, HTN, IIDM, HLD, paroxysmal SVT, PVD status post right BKA, BPH, presented with uncontrolled A. fib. new onset atrial fibrillation for presenting to the ER for trying to remove a foot callus and having subsequent bleeding. He was unaware of heart rhythm. He was admitted.   Cardiology was consulted after admission.  Wound care nurse consulted for left foot wound.  No evidence of infection.  He was still in A. fib with heart rate in the range of 110-120/min, this morning.  He was started on Eliquis, digoxin.  Importance of staying 1 more night in the [facility] for close monitoring and further planning for his A. fib.  Patient did  not want to stay and adamant to leave [name] in case he was not discharged.  After conversations with the patient and cardiology, it was decided to discharge him  with current medications. He was scheduled to f/u in afib clinic as an outpatient.  We have requested him to quit alcohol.   Today, he remains in afib. He is asymptomatic. He started eliquis 5 mg bid , first full day 05/16/21.  I discussed TEE guided  cardioversion and he is not overly enthusiastic to have this done. By the time, we have an opening for CV, if no missed doses of ELiquis, which I STRESSED, it will be 06/08/21 and he will not need TEE. He is also questioning  why he needs the appointment with [name] in HF on 12/28. Echo in the [facility] showed left ventricular ejection fraction, by estimation, is 40 to 45%. The left ventricle has mildly decreased function. I explained to pt that this appointment was needed for the pump function of his heart. He states that he is not drinking alcohol now.   Today, he denies symptoms of palpitations, chest pain, shortness of breath, orthopnea, PND, lower  extremity edema, dizziness, presyncope, syncope, or neurologic sequela. The patient is tolerating medications without difficulties and is otherwise without complaint today.   Past Medical History:  Diagnosis Date   CAD (coronary artery disease)    a. Remote cath 2008 for chest pain showed 30% LAD with luminal irregularity and fairly heavy calcification in the mid LAD without critical stenosis, 30% OM1, 30% acute marginal.   Cancer (HCC)    skin cancer - basal   Cataract    both   Chronic combined systolic and diastolic CHF (congestive heart failure) (HCC)    Complication of anesthesia    Diabetes mellitus    Type II   ED (erectile dysfunction)    History of kidney stones    x1   Hypertension    NICM (nonischemic cardiomyopathy) ([name])    a. EF 40-45% by echo in 08/2017 - prior low risk stress test in 2017.   Osteoarthritis    Paroxysmal SVT (supraventricular tachycardia) (HCC)    PONV (postoperative nausea and vomiting) 1960   with Ether   PVC's (premature ventricular contractions)    PVD (peripheral vascular disease) (HCC)    s/p R BKA   Rheumatic fever    Rhinitis    S/P BKA (below knee amputation), right [name])    Past Surgical History:  Procedure Laterality Date   AMPUTATION Right 02/29/2016   Procedure: Right Leg AMPUTATION BELOW KNEE with Wound Vac placement;  Surgeon: [name], MD;  Location: [name];  Service: Orthopedics;  Laterality: Right;   CHOLECYSTECTOMY  1983   colonoscopy  2006, 2010   EYE SURGERY Bilateral    cataract   INGUINAL HERNIA REPAIR Right 1960   LEFT HEART CATH AND CORONARY ANGIOGRAPHY N/A 06/03/2018   Procedure: LEFT HEART CATH AND CORONARY ANGIOGRAPHY;  Surgeon: Belva Crome, MD;  Location: [name];  Service: Cardiovascular;  Laterality: N/A;   STUMP REVISION Right 10/30/2017   Procedure: RIGHT BELOW KNEE AMPUTATION REVISION;  Surgeon: [name], MD;  Location: [name];  Service: Orthopedics;  Laterality: Right;    Current  Outpatient Medications  Medication Sig Dispense Refill   ACCU-CHEK AVIVA PLUS test strip USE TO TEST GLUCOSE ONCE DAILY E11.9 100 strip 11   Accu-Chek FastClix Lancets MISC USE TO CHECK BLOOD SUGAR DAILY AND AS NEEDED 102 each 4   apixaban (ELIQUIS) 5 MG TABS tablet Take 1 tablet (5 mg total) by mouth 2 (two) times daily. 60 tablet 0   carvedilol (COREG) 25 MG tablet TAKE 1 TABLET BY MOUTH TWICE A DAY (Patient taking differently: Take 25 mg by mouth in the morning and at bedtime.) 180 tablet 3   digoxin (LANOXIN) 0.125 MG tablet Take 1 tablet (0.125 mg total) by mouth daily. 30 tablet 0   furosemide (LASIX) 20 MG tablet TAKE 1 TABLET (20 MG TOTAL) BY MOUTH DAILY AS NEEDED FOR EDEMA. 90 tablet 1   gabapentin (NEURONTIN) 100 MG capsule TAKE 1 CAPSULE BY MOUTH THREE TIMES A DAY (Patient taking differently: Take 100 mg by mouth 2 (two) times daily.) 270 capsule 3   halobetasol (ULTRAVATE) 0.05 % cream APPLY TO AFFECTED AREA TWICE A DAY (Patient taking differently: Apply 1 application topically in the morning and at bedtime.) 50 g 2   metFORMIN (GLUCOPHAGE) 1000 MG tablet TAKE 1 TABLET BY MOUTH EVERY DAY WITH BREAKFAST (Patient taking differently: Take 1,000 mg by mouth daily.) 90 tablet 1   nystatin cream (MYCOSTATIN) APPLY TO AFFECTED AREA TWICE A DAY (Patient taking differently: Apply 1 application topically 2 (two) times daily.) 30 g 2   pantoprazole (PROTONIX) 40 MG tablet TAKE 1 TABLET BY MOUTH EVERY DAY (Patient taking differently: Take 40 mg by mouth daily.) 90 tablet 1   potassium chloride SA (KLOR-CON M20) 20 MEQ tablet TAKE 2 TABLETS BY MOUTH EVERY MORNING (Patient taking differently: Take 40 mEq by mouth daily.) 180 tablet 3   sacubitril-valsartan (ENTRESTO) 24-26 MG Take 1 tablet by mouth 2 (two) times daily. 60 tablet 0   sildenafil (VIAGRA) 100 MG tablet TAKE 1 TABLET DAILY AS NEEDED ED (Patient taking differently: Take 100 mg by mouth daily as needed for erectile dysfunction.) 4 tablet 22    solifenacin (VESICARE) 10 MG tablet Take 10 mg by mouth daily.     tamsulosin (FLOMAX) 0.4 MG CAPS capsule TAKE 1 CAPSULE BY MOUTH EVERY DAY (Patient taking differently: Take 0.4 mg by mouth daily.) 90 capsule 3   No current facility-administered medications for this encounter.    Allergies  Allergen Reactions   Lisinopril Cough    Social History   Socioeconomic History   Marital status: Married    Spouse name: Not on file   Number of children: 1   Years of education: 16+   Highest education level: Not on file  Occupational History   Occupation: Curator - [employer]    Employer: RETIRED  Tobacco Use   Smoking  status: Former    Years: 20.00    Types: Cigarettes    Quit date: 07/26/1981    Years since quitting: 39.8   Smokeless tobacco: Never  Vaping Use   Vaping Use: Never used  Substance and Sexual Activity   Alcohol use: Not Currently    Comment: occasional glass of wine   Drug use: No   Sexual activity: Not Currently  Other Topics Concern   Not on file  Social History Narrative   Lives with wife.   epworth sleepiness scale = 5 (01/20/16)   Social Determinants of Health   Financial Resource Strain: Medium Risk   Difficulty of Paying Living Expenses: Somewhat hard  Food Insecurity: No Food Insecurity   Worried About Charity fundraiser in the Last Year: Never true   Ran Out of Food in the Last Year: Never true  Transportation Needs: No Transportation Needs   Lack of Transportation (Medical): No   Lack of Transportation (Non-Medical): No  Physical Activity: Not on file  Stress: Not on file  Social Connections: Not on file  Intimate Partner Violence: Not on file    Family History  Problem Relation Age of Onset   Stroke Father 36       Died age 11   Coronary artery disease Brother 59   Colon cancer Neg Hx    Colon polyps Neg Hx    Diabetes Neg Hx    Esophageal cancer Neg Hx    Kidney disease Neg Hx    Gallbladder disease Neg Hx      ROS- All systems are reviewed and negative except as per the HPI above  Physical Exam: Vitals:   05/18/21 0959  BP: 114/62  Pulse: (!) 106  Weight: 97.1 kg  Height: 6' (1.829 m)   Wt Readings from Last 3 Encounters:  05/18/21 97.1 kg  05/15/21 97.1 kg  05/12/21 95.8 kg    Labs: [facility] Results  Component Value Date   NA 137 05/12/2021   K 3.6 05/12/2021   CL 106 05/12/2021   CO2 24 05/12/2021   GLUCOSE 100 (H) 05/12/2021   BUN 17 05/12/2021   CREATININE 0.86 05/12/2021   CALCIUM 8.6 (L) 05/12/2021   MG 1.8 06/03/2018   [facility] Results  Component Value Date   INR 1.18 12/10/2015   [facility] Results  Component Value Date   CHOL 148 09/04/2019   HDL 36.00 (L) 09/04/2019   LDLCALC 93 09/04/2019   TRIG 94.0 09/04/2019     GEN- The patient is well appearing, alert and oriented x 3 today.   Head- normocephalic, atraumatic Eyes-  Sclera clear, conjunctiva pink Ears- hearing intact Oropharynx- clear Neck- supple, no JVP Lymph- no cervical lymphadenopathy Lungs- Clear to ausculation bilaterally, normal work of breathing Heart- Regular rate and rhythm, no murmurs, rubs or gallops, PMI not laterally displaced GI- soft, NT, ND, + BS Extremities- no clubbing, cyanosis, or edema MS- no significant deformity or atrophy Skin- no rash or lesion Psych- euthymic mood, full affect Neuro- strength and sensation are intact  EKG-atrial flutter at 106 bpm, qrs int 120 ms, qtc 454 ms   Echo- 1. Poor windows and tachycardic. Wall motion cannot be fully assessed. .  Left ventricular ejection fraction, by estimation, is 40 to 45%. The left  ventricle has mildly decreased function. There is mild left ventricular  hypertrophy.   2. IVC not well visualized to calculate RVSP. Right ventricular systolic  function is normal.   3. Left atrial size  was moderately dilated.   4. Right atrial size was moderately dilated.   5. Trivial mitral valve regurgitation.   6. Aortic valve regurgitation is  not visualized.   7. The inferior vena cava not well visualized.   Assessment and Plan:  1. New onset afib Pt is asymptomatic  He is rate controlled  He states he is being compliant with medicines  No change in current meds  First full day of anticoagulation was 05/16/21 and cardioversion's are scheduled out until mid January so cardioversion scheduled for 1/12, is more than 3 weeks out and he will not require TEE.   He was informed or risk vrs benefit of cardioversion, he is not  overly enthusiastic re having the procedure done.  Stressed to pt not to miss any eliquis 5 mg bid prior to the cardioversion  Cbc/bmet   2. LV dysfunction  [facility] f[facility]u [facility] 12/[facility]  He will f/u with [facility], PA, as scheduled 06/21/21.   Geroge Baseman Jhamal Plucinski, [facility] [address] [place], [place] [address] [phone]

## 2021-05-18 NOTE — H&P (View-Only) (Signed)
Primary Care Physician: Vivi Barrack, MD Referring Physician: [facility] f/u [name], [place]  Cardiologist: Dr.    [name] is a 80 y.o. male with a h/o significant of chronic systolic CHF LVEF 51-88%, HTN, IIDM, HLD, paroxysmal SVT, PVD status post right BKA, BPH, presented with uncontrolled A. fib. new onset atrial fibrillation for presenting to the ER for trying to remove a foot callus and having subsequent bleeding. He was unaware of heart rhythm. He was admitted.   Cardiology was consulted after admission.  Wound care nurse consulted for left foot wound.  No evidence of infection.  He was still in A. fib with heart rate in the range of 110-120/min, this morning.  He was started on Eliquis, digoxin.  Importance of staying 1 more night in the hospital for close monitoring and further planning for his A. fib.  Patient did  not want to stay and adamant to leave [facility] in case he was not discharged.  After conversations with the patient and cardiology, it was decided to discharge him  with current medications. He was scheduled to f/u in afib clinic as an outpatient.  We have requested him to quit alcohol.   Today, he remains in afib. He is asymptomatic. He started eliquis 5 mg bid , first full day 05/16/21.  I discussed TEE guided  cardioversion and he is not overly enthusiastic to have this done. By the time, we have an opening for CV, if no missed doses of ELiquis, which I STRESSED, it will be 06/08/21 and he will not need TEE. He is also questioning  why he needs the appointment with [facility] clinic in HF on 12/28. Echo in the hospital showed left ventricular ejection fraction, by estimation, is 40 to 45%. The left ventricle has mildly decreased function. I explained to pt that this appointment was needed for the pump function of his heart. He states that he is not drinking alcohol now.   Today, he denies symptoms of palpitations, chest pain, shortness of breath, orthopnea, PND, lower  extremity edema, dizziness, presyncope, syncope, or neurologic sequela. The patient is tolerating medications without difficulties and is otherwise without complaint today.   Past Medical History:  Diagnosis Date   CAD (coronary artery disease)    a. Remote cath 2008 for chest pain showed 30% LAD with luminal irregularity and fairly heavy calcification in the mid LAD without critical stenosis, 30% OM1, 30% acute marginal.   Cancer (HCC)    skin cancer - basal   Cataract    both   Chronic combined systolic and diastolic CHF (congestive heart failure) (HCC)    Complication of anesthesia    Diabetes mellitus    Type II   ED (erectile dysfunction)    History of kidney stones    x1   Hypertension    NICM (nonischemic cardiomyopathy) ([place])    a. EF 40-45% by echo in 08/2017 - prior low risk stress test in 2017.   Osteoarthritis    Paroxysmal SVT (supraventricular tachycardia) (HCC)    PONV (postoperative nausea and vomiting) 1960   with Ether   PVC's (premature ventricular contractions)    PVD (peripheral vascular disease) (HCC)    s/p R BKA   Rheumatic fever    Rhinitis    S/P BKA (below knee amputation), right [facility])    Past Surgical History:  Procedure Laterality Date   AMPUTATION Right 02/29/2016   Procedure: Right Leg AMPUTATION BELOW KNEE with Wound Vac placement;  Surgeon: [name], MD;  Location: [facility];  Service: Orthopedics;  Laterality: Right;   CHOLECYSTECTOMY  1983   colonoscopy  2006, 2010   EYE SURGERY Bilateral    cataract   INGUINAL HERNIA REPAIR Right 1960   LEFT HEART CATH AND CORONARY ANGIOGRAPHY N/A 06/03/2018   Procedure: LEFT HEART CATH AND CORONARY ANGIOGRAPHY;  Surgeon: Belva Crome, MD;  Location: [facility];  Service: Cardiovascular;  Laterality: N/A;   STUMP REVISION Right 10/30/2017   Procedure: RIGHT BELOW KNEE AMPUTATION REVISION;  Surgeon: [name], MD;  Location: [facility];  Service: Orthopedics;  Laterality: Right;    Current  Outpatient Medications  Medication Sig Dispense Refill   ACCU-CHEK AVIVA PLUS test strip USE TO TEST GLUCOSE ONCE DAILY E11.9 100 strip 11   Accu-Chek FastClix Lancets MISC USE TO CHECK BLOOD SUGAR DAILY AND AS NEEDED 102 each 4   apixaban (ELIQUIS) 5 MG TABS tablet Take 1 tablet (5 mg total) by mouth 2 (two) times daily. 60 tablet 0   carvedilol (COREG) 25 MG tablet TAKE 1 TABLET BY MOUTH TWICE A DAY (Patient taking differently: Take 25 mg by mouth in the morning and at bedtime.) 180 tablet 3   digoxin (LANOXIN) 0.125 MG tablet Take 1 tablet (0.125 mg total) by mouth daily. 30 tablet 0   furosemide (LASIX) 20 MG tablet TAKE 1 TABLET (20 MG TOTAL) BY MOUTH DAILY AS NEEDED FOR EDEMA. 90 tablet 1   gabapentin (NEURONTIN) 100 MG capsule TAKE 1 CAPSULE BY MOUTH THREE TIMES A DAY (Patient taking differently: Take 100 mg by mouth 2 (two) times daily.) 270 capsule 3   halobetasol (ULTRAVATE) 0.05 % cream APPLY TO AFFECTED AREA TWICE A DAY (Patient taking differently: Apply 1 application topically in the morning and at bedtime.) 50 g 2   metFORMIN (GLUCOPHAGE) 1000 MG tablet TAKE 1 TABLET BY MOUTH EVERY DAY WITH BREAKFAST (Patient taking differently: Take 1,000 mg by mouth daily.) 90 tablet 1   nystatin cream (MYCOSTATIN) APPLY TO AFFECTED AREA TWICE A DAY (Patient taking differently: Apply 1 application topically 2 (two) times daily.) 30 g 2   pantoprazole (PROTONIX) 40 MG tablet TAKE 1 TABLET BY MOUTH EVERY DAY (Patient taking differently: Take 40 mg by mouth daily.) 90 tablet 1   potassium chloride SA (KLOR-CON M20) 20 MEQ tablet TAKE 2 TABLETS BY MOUTH EVERY MORNING (Patient taking differently: Take 40 mEq by mouth daily.) 180 tablet 3   sacubitril-valsartan (ENTRESTO) 24-26 MG Take 1 tablet by mouth 2 (two) times daily. 60 tablet 0   sildenafil (VIAGRA) 100 MG tablet TAKE 1 TABLET DAILY AS NEEDED ED (Patient taking differently: Take 100 mg by mouth daily as needed for erectile dysfunction.) 4 tablet 22    solifenacin (VESICARE) 10 MG tablet Take 10 mg by mouth daily.     tamsulosin (FLOMAX) 0.4 MG CAPS capsule TAKE 1 CAPSULE BY MOUTH EVERY DAY (Patient taking differently: Take 0.4 mg by mouth daily.) 90 capsule 3   No current facility-administered medications for this encounter.    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   epworth sleepiness scale = 5 (01/20/16)   Social Determinants of Health   Financial Resource Strain: Medium Risk   Difficulty of Paying Living Expenses: Somewhat hard  Food Insecurity: No Food Insecurity   Worried About Charity fundraiser in the Last Year: Never true   Ran Out of Food in the Last Year: Never true  Transportation Needs: No Transportation Needs   Lack of Transportation (Medical): No   Lack of Transportation (Non-Medical): No  Physical Activity: Not on file  Stress: Not on file  Social Connections: Not on file  Intimate Partner Violence: Not on file    Family History  Problem Relation Age of Onset   Stroke Father 46       Died age 70   Coronary artery disease Brother 60   Colon cancer Neg Hx    Colon polyps Neg Hx    Diabetes Neg Hx    Esophageal cancer Neg Hx    Kidney disease Neg Hx    Gallbladder disease Neg Hx      ROS- All systems are reviewed and negative except as per the HPI above  Physical Exam: Vitals:   05/18/21 0959  BP: 114/62  Pulse: (!) 106  Weight: 97.1 kg  Height: 6' (1.829 m)   Wt Readings from Last 3 Encounters:  05/18/21 97.1 kg  05/15/21 97.1 kg  05/12/21 95.8 kg    Labs: Lab Results  Component Value Date   NA 137 05/12/2021   K 3.6 05/12/2021   CL 106 05/12/2021   CO2 24 05/12/2021   GLUCOSE 100 (H) 05/12/2021   BUN 17 05/12/2021   CREATININE 0.86 05/12/2021   CALCIUM 8.6 (L) 05/12/2021   MG 1.8 06/03/2018   Lab Results  Component Value Date   INR 1.18 12/10/2015   Lab Results  Component Value Date   CHOL 148 09/04/2019   HDL 36.00 (L) 09/04/2019   LDLCALC 93 09/04/2019   TRIG 94.0 09/04/2019     GEN- The patient is well appearing, alert and oriented x 3 today.   Head- normocephalic, atraumatic Eyes-  Sclera clear, conjunctiva pink Ears- hearing intact Oropharynx- clear Neck- supple, no JVP Lymph- no cervical lymphadenopathy Lungs- Clear to ausculation bilaterally, normal work of breathing Heart- Regular rate and rhythm, no murmurs, rubs or gallops, PMI not laterally displaced GI- soft, NT, ND, + BS Extremities- no clubbing, cyanosis, or edema MS- no significant deformity or atrophy Skin- no rash or lesion Psych- euthymic mood, full affect Neuro- strength and sensation are intact  EKG-atrial flutter at 106 bpm, qrs int 120 ms, qtc 454 ms   Echo- 1. Poor windows and tachycardic. Wall motion cannot be fully assessed. .  Left ventricular ejection fraction, by estimation, is 40 to 45%. The left  ventricle has mildly decreased function. There is mild left ventricular  hypertrophy.   2. IVC not well visualized to calculate RVSP. Right ventricular systolic  function is normal.   3. Left atrial size  was moderately dilated.   4. Right atrial size was moderately dilated.   5. Trivial mitral valve regurgitation.   6. Aortic valve regurgitation is  not visualized.   7. The inferior vena cava not well visualized.   Assessment and Plan:  1. New onset afib Pt is asymptomatic  He is rate controlled  He states he is being compliant with medicines  No change in current meds  First full day of anticoagulation was 05/16/21 and cardioversion's are scheduled out until mid January so cardioversion scheduled for 1/12, is more than 3 weeks out and he will not require TEE.   He was informed or risk vrs benefit of cardioversion, he is not  overly enthusiastic re having the procedure done.  Stressed to pt not to miss any eliquis 5 mg bid prior to the cardioversion  Cbc/bmet   2. LV dysfunction  Has f/u with [facility] clinic 12/28  He will f/u with [name], PA, as scheduled 06/21/21.   Geroge Baseman Jordani Nunn, [facility] [address] [place], [place] [address] [phone]

## 2021-05-19 ENCOUNTER — Encounter (HOSPITAL_COMMUNITY): Payer: Self-pay | Admitting: Nurse Practitioner

## 2021-05-19 NOTE — Addendum Note (Signed)
Encounter addended by: Sherran Needs, NP on: 05/19/2021 11:07 AM  Actions taken: Flowsheet accepted, Medication List reviewed, Problem List reviewed, Allergies reviewed, Level of Service modified, Visit diagnoses modified

## 2021-05-23 ENCOUNTER — Encounter: Payer: Self-pay | Admitting: Physician Assistant

## 2021-05-23 ENCOUNTER — Telehealth: Payer: Self-pay | Admitting: Physician Assistant

## 2021-05-23 ENCOUNTER — Ambulatory Visit (INDEPENDENT_AMBULATORY_CARE_PROVIDER_SITE_OTHER): Payer: Medicare Other | Admitting: Physician Assistant

## 2021-05-23 ENCOUNTER — Ambulatory Visit (HOSPITAL_COMMUNITY)
Admission: RE | Admit: 2021-05-23 | Discharge: 2021-05-23 | Disposition: A | Discharge status: Home / Self Care | Payer: Medicare Other | Source: Ambulatory Visit | Attending: Physician Assistant | Admitting: Physician Assistant

## 2021-05-23 ENCOUNTER — Ambulatory Visit (INDEPENDENT_AMBULATORY_CARE_PROVIDER_SITE_OTHER)
Admission: RE | Admit: 2021-05-23 | Discharge: 2021-05-23 | Disposition: A | Discharge status: Home / Self Care | Payer: Medicare Other | Source: Ambulatory Visit | Attending: Physician Assistant | Admitting: Physician Assistant

## 2021-05-23 ENCOUNTER — Other Ambulatory Visit: Payer: Self-pay

## 2021-05-23 VITALS — BP 80/62 | HR 92 | Temp 98.0°F | Ht 72.0 in | Wt 212.6 lb

## 2021-05-23 DIAGNOSIS — L97521 Non-pressure chronic ulcer of other part of left foot limited to breakdown of skin: Secondary | ICD-10-CM | POA: Diagnosis not present

## 2021-05-23 DIAGNOSIS — I10 Essential (primary) hypertension: Secondary | ICD-10-CM | POA: Diagnosis not present

## 2021-05-23 DIAGNOSIS — M7989 Other specified soft tissue disorders: Secondary | ICD-10-CM

## 2021-05-23 DIAGNOSIS — S91302A Unspecified open wound, left foot, initial encounter: Secondary | ICD-10-CM | POA: Diagnosis not present

## 2021-05-23 MED ORDER — DOXYCYCLINE HYCLATE 100 MG PO TABS: 100.0000 mg | ORAL_TABLET | Freq: Two times a day (BID) | ORAL | 0 refills | Status: DC

## 2021-05-23 NOTE — Progress Notes (Signed)
[name] is a 80 y.o. male here for foot pain.  History of Present Illness:   Chief Complaint  Patient presents with   Foot Pain    Pt c/o left foot pain since last visit, was seen 12/19 by Christ Kick presented to today's visit with a close friend.   HPI  Foot Pain [name] presents with c/o left foot pain that has been onset for two weeks, worsening over time. [name] previously saw Dr. Jerline Pain for this issue on 05/15/21 and was found to have an ulcer on his left foot. During this visit, there were no signs of infection or surrounding erythema, and healthy granulation tissue was present. Due to this Dr. Jerline Pain recommended [name] follow up with Dr Earleen Roberts, podiatry for further evaluation. The patient did not follow-up with podiatry as instructed.  Currently [name] states he has been experiencing increased pain in his left foot as well as redness, swelling, clear drainage. In an effort to prevent any infection, pt has kept the ulcer covered and cleaned.   Denies: fevers, chills, malaise, n/v/d, SOB, chest pain  He has significant swelling throughout his entire L lower leg. He is on eliquis 5 mg BID for newly diagnosed a-fib but he is unable to tell me if he is taking this regularly. He states that he has had stomach irritation with some of his meds and is not taking all of them due to that and cannot tell me which ones he is not taking.  HTN Currently taking entresto 24-26 mg, coreg 25 mg BID. At home blood pressure readings are: 130/80 per patient. Patient denies chest pain, SOB, blurred vision, dizziness, lightheadedness, unusual headaches. Patient is compliant with medication. Denies excessive caffeine intake, stimulant usage, excessive alcohol intake, or increase in salt consumption.  BP Readings from Last 3 Encounters:  05/23/21 (!) 80/62  05/18/21 114/62  05/15/21 (!) 88/64       Past Medical History:  Diagnosis Date   CAD (coronary artery disease)    a. Remote  cath 2008 for chest pain showed 30% LAD with luminal irregularity and fairly heavy calcification in the mid LAD without critical stenosis, 30% OM1, 30% acute marginal.   Cancer (HCC)    skin cancer - basal   Cataract    both   Chronic combined systolic and diastolic CHF (congestive heart failure) (HCC)    Complication of anesthesia    Diabetes mellitus    Type II   ED (erectile dysfunction)    History of kidney stones    x1   Hypertension    NICM (nonischemic cardiomyopathy) ([place])    a. EF 40-45% by echo in 08/2017 - prior low risk stress test in 2017.   Osteoarthritis    Paroxysmal SVT (supraventricular tachycardia) (HCC)    PONV (postoperative nausea and vomiting) 1960   with Ether   PVC's (premature ventricular contractions)    PVD (peripheral vascular disease) (HCC)    s/p R BKA   Rheumatic fever    Rhinitis    S/P BKA (below knee amputation), right (HCC)      Social History   Tobacco Use   Smoking status: Former    Years: 20.00    Types: Cigarettes    Quit date: 07/26/1981    Years since quitting: 39.8   Smokeless tobacco: Never  Vaping Use   Vaping Use: Never used  Substance Use Topics   Alcohol use: Not Currently    Comment: occasional glass  of wine   Drug use: No    Past Surgical History:  Procedure Laterality Date   AMPUTATION Right 02/29/2016   Procedure: Right Leg AMPUTATION BELOW KNEE with Wound Vac placement;  Surgeon: [name], MD;  Location: [facility];  Service: Orthopedics;  Laterality: Right;   CHOLECYSTECTOMY  1983   colonoscopy  2006, 2010   EYE SURGERY Bilateral    cataract   INGUINAL HERNIA REPAIR Right 1960   LEFT HEART CATH AND CORONARY ANGIOGRAPHY N/A 06/03/2018   Procedure: LEFT HEART CATH AND CORONARY ANGIOGRAPHY;  Surgeon: Belva Crome, MD;  Location: [facility];  Service: Cardiovascular;  Laterality: N/A;   STUMP REVISION Right 10/30/2017   Procedure: RIGHT BELOW KNEE AMPUTATION REVISION;  Surgeon: [name], MD;  Location:  [facility];  Service: Orthopedics;  Laterality: Right;    Family History  Problem Relation Age of Onset   Stroke Father 36       Died age 66   Coronary artery disease Brother 91   Colon cancer Neg Hx    Colon polyps Neg Hx    Diabetes Neg Hx    Esophageal cancer Neg Hx    Kidney disease Neg Hx    Gallbladder disease Neg Hx     Allergies  Allergen Reactions   Lisinopril Cough    Current Medications:   Current Outpatient Medications:    ACCU-CHEK AVIVA PLUS test strip, USE TO TEST GLUCOSE ONCE DAILY E11.9, Disp: 100 strip, Rfl: 11   Accu-Chek FastClix Lancets MISC, USE TO CHECK BLOOD SUGAR DAILY AND AS NEEDED, Disp: 102 each, Rfl: 4   apixaban (ELIQUIS) 5 MG TABS tablet, Take 1 tablet (5 mg total) by mouth 2 (two) times daily., Disp: 60 tablet, Rfl: 0   carvedilol (COREG) 25 MG tablet, TAKE 1 TABLET BY MOUTH TWICE A DAY (Patient taking differently: Take 25 mg by mouth in the morning and at bedtime.), Disp: 180 tablet, Rfl: 3   digoxin (LANOXIN) 0.125 MG tablet, Take 1 tablet (0.125 mg total) by mouth daily., Disp: 30 tablet, Rfl: 0   furosemide (LASIX) 20 MG tablet, TAKE 1 TABLET (20 MG TOTAL) BY MOUTH DAILY AS NEEDED FOR EDEMA., Disp: 90 tablet, Rfl: 1   gabapentin (NEURONTIN) 100 MG capsule, TAKE 1 CAPSULE BY MOUTH THREE TIMES A DAY (Patient taking differently: Take 100 mg by mouth 2 (two) times daily.), Disp: 270 capsule, Rfl: 3   halobetasol (ULTRAVATE) 0.05 % cream, APPLY TO AFFECTED AREA TWICE A DAY (Patient taking differently: Apply 1 application topically in the morning and at bedtime.), Disp: 50 g, Rfl: 2   metFORMIN (GLUCOPHAGE) 1000 MG tablet, TAKE 1 TABLET BY MOUTH EVERY DAY WITH BREAKFAST (Patient taking differently: Take 1,000 mg by mouth daily.), Disp: 90 tablet, Rfl: 1   nystatin cream (MYCOSTATIN), APPLY TO AFFECTED AREA TWICE A DAY (Patient taking differently: Apply 1 application topically 2 (two) times daily.), Disp: 30 g, Rfl: 2   pantoprazole (PROTONIX) 40 MG tablet,  TAKE 1 TABLET BY MOUTH EVERY DAY (Patient taking differently: Take 40 mg by mouth daily.), Disp: 90 tablet, Rfl: 1   potassium chloride SA (KLOR-CON M20) 20 MEQ tablet, TAKE 2 TABLETS BY MOUTH EVERY MORNING (Patient taking differently: Take 40 mEq by mouth daily.), Disp: 180 tablet, Rfl: 3   sacubitril-valsartan (ENTRESTO) 24-26 MG, Take 1 tablet by mouth 2 (two) times daily., Disp: 60 tablet, Rfl: 0   sildenafil (VIAGRA) 100 MG tablet, TAKE 1 TABLET DAILY AS NEEDED  ED (Patient taking differently: Take 100 mg by mouth daily as needed for erectile dysfunction.), Disp: 4 tablet, Rfl: 22   solifenacin (VESICARE) 10 MG tablet, Take 10 mg by mouth daily., Disp: , Rfl:    tamsulosin (FLOMAX) 0.4 MG CAPS capsule, TAKE 1 CAPSULE BY MOUTH EVERY DAY (Patient taking differently: Take 0.4 mg by mouth daily.), Disp: 90 capsule, Rfl: 3   Review of Systems:   ROS Negative unless otherwise specified per HPI. Vitals:   Vitals:   05/23/21 1132  BP: (!) 80/62  Pulse: 92  Temp: 98 F (36.7 C)  TempSrc: Temporal  SpO2: 98%  Weight: 212 lb 9.6 oz (96.4 kg)  Height: 6' (1.829 m)     Body mass index is 28.83 kg/m.  Physical Exam:   Physical Exam Vitals and nursing note reviewed.  Constitutional:      General: He is not in acute distress.    Appearance: He is well-developed. He is not ill-appearing or toxic-appearing.  Cardiovascular:     Rate and Rhythm: Normal rate. Rhythm irregular.     Pulses: Normal pulses.     Heart sounds: Normal heart sounds, S1 normal and S2 normal.  Pulmonary:     Effort: Pulmonary effort is normal.     Breath sounds: Normal breath sounds.  Feet:     Left foot:     Skin integrity: Ulcer, skin breakdown and erythema present.     Comments: Approximately 1.5 open ulcer on bottom of medial aspect of left foot. Serosanguinous discharge present Significant erythema to distal L foot Cap refill normal in L toes Pitting edema to L leg Skin:    General: Skin is warm and dry.   Neurological:     Mental Status: He is alert.     GCS: GCS eye subscore is 4. GCS verbal subscore is 5. GCS motor subscore is 6.  Psychiatric:        Speech: Speech normal.        Behavior: Behavior normal. Behavior is cooperative.    Assessment and Plan:   Left leg swelling New per patient He is unable to tell me if he is taking his eliquis Will order stat u/s to r/o DVT for further evaluation and provide recommendations accordingly  Ulcer of left foot, limited to breakdown of skin (HCC) Uncontrolled Start doxycycline 100 mg twice daily for infection I have ordered xray of foot for further evaluation   Provided patient with information to follow up wth Dr. Earleen Roberts, Podiatry, for further evaluation  ER precautions discussed -- if worsening redness or new fevers/chills/etc -- recommend ER evaluation ASAP  Essential hypertension Lower than normal but asymptomatic Informed patient that if they develop lightheadedness, weakness, or dizziness, to call cardiology or visit the ER immediately I told him to keep an eye on blood pressure and follow-up with cardiology if BP remains low or other cardiac concerns  I,Havlyn C Ratchford,acting as a scribe for Inda Coke, PA.,have documented all relevant documentation on the behalf of Inda Coke, PA,as directed by  Inda Coke, PA while in the presence of Inda Coke, [place].  I, Inda Coke, [place], have reviewed all documentation for this visit. The documentation on 05/23/21 for the exam, diagnosis, procedures, and orders are all accurate and complete.   Inda Coke, PA-C

## 2021-05-23 NOTE — Telephone Encounter (Signed)
[name] with [facility]ascular [facility] vein specialist in lab calling to give report, pt is negative for DVT superficial thrombus, pt has interstitial fluid in the calf of leg. They said results will be available in epic

## 2021-05-23 NOTE — Patient Instructions (Addendum)
It was great to see you!  For your ultrasound: please arrive by 12:45 for a 1:00 pm appointment with [facility], [place], [place] [address]  Here is the phone number: [phone]  *In the future please bring ALL medications with you to your appointment so you know what you are taking regularly  For your foot: Start oral doxycyline antibiotic Please obtain ultrasound as scheduled Please go get your xray at [facility] (see below) Please call [phone] to schedule an appointment with podiatry --> Dr. Jacqualyn Posey or another provider at  [facility] Address: [address], [place], [place] [address]  An order for an xray has been put in for you. To get your xray, you can walk in at the [facility] location without a scheduled appointment.  The address is [address]. It is across the street from [place] is located in the basement.  Hours of operation are M-F 8:30am to 5:00pm.  Please note that they are closed for lunch between 12:30 and 1:00pm.  Contact a health care provider if: Your pain is not controlled with medicine. You have more redness, swelling, or pain around your wound. You have more fluid or blood coming from your wound. Your wound feels warm to the touch. You have pus coming from your wound. You continue to notice a bad smell coming from your wound or your dressing. Your wound that was closed breaks open.  Get help right away if: You have a red streak going away from your wound. You have a fever.  Keep an eye on your blood pressure If it remains low, please follow-up with your cardiologist  If you develop lightheadedness, weakness, dizziness -- please call the cardiologist or go to the ER  Take care,  Inda Coke PA-C

## 2021-05-23 NOTE — Telephone Encounter (Signed)
Please see message. °

## 2021-05-24 ENCOUNTER — Ambulatory Visit (HOSPITAL_COMMUNITY)
Admit: 2021-05-24 | Discharge: 2021-05-24 | Disposition: A | Discharge status: Home / Self Care | Payer: Medicare Other | Attending: Cardiology | Admitting: Cardiology

## 2021-05-24 ENCOUNTER — Encounter (HOSPITAL_COMMUNITY): Payer: Self-pay

## 2021-05-24 VITALS — BP 110/80 | HR 110 | Wt 215.8 lb

## 2021-05-24 DIAGNOSIS — Z89511 Acquired absence of right leg below knee: Secondary | ICD-10-CM | POA: Diagnosis not present

## 2021-05-24 DIAGNOSIS — I5042 Chronic combined systolic (congestive) and diastolic (congestive) heart failure: Secondary | ICD-10-CM | POA: Diagnosis not present

## 2021-05-24 DIAGNOSIS — I4819 Other persistent atrial fibrillation: Secondary | ICD-10-CM | POA: Diagnosis not present

## 2021-05-24 DIAGNOSIS — I110 Hypertensive heart disease with heart failure: Secondary | ICD-10-CM | POA: Insufficient documentation

## 2021-05-24 DIAGNOSIS — I2510 Atherosclerotic heart disease of native coronary artery without angina pectoris: Secondary | ICD-10-CM | POA: Diagnosis not present

## 2021-05-24 DIAGNOSIS — R000 Tachycardia, unspecified: Secondary | ICD-10-CM | POA: Insufficient documentation

## 2021-05-24 DIAGNOSIS — I5022 Chronic systolic (congestive) heart failure: Secondary | ICD-10-CM

## 2021-05-24 DIAGNOSIS — E1151 Type 2 diabetes mellitus with diabetic peripheral angiopathy without gangrene: Secondary | ICD-10-CM | POA: Diagnosis not present

## 2021-05-24 DIAGNOSIS — Z7901 Long term (current) use of anticoagulants: Secondary | ICD-10-CM | POA: Insufficient documentation

## 2021-05-24 DIAGNOSIS — F1010 Alcohol abuse, uncomplicated: Secondary | ICD-10-CM | POA: Diagnosis not present

## 2021-05-24 DIAGNOSIS — E785 Hyperlipidemia, unspecified: Secondary | ICD-10-CM | POA: Insufficient documentation

## 2021-05-24 DIAGNOSIS — Z79899 Other long term (current) drug therapy: Secondary | ICD-10-CM | POA: Diagnosis not present

## 2021-05-24 LAB — CBC
HCT: 42.9 % (ref 39.0–52.0)
Hemoglobin: 13.8 g/dL (ref 13.0–17.0)
MCH: 29.4 pg (ref 26.0–34.0)
MCHC: 32.2 g/dL (ref 30.0–36.0)
MCV: 91.3 fL (ref 80.0–100.0)
Platelets: 231 10*3/uL (ref 150–400)
RBC: 4.7 MIL/uL (ref 4.22–5.81)
RDW: 13.3 % (ref 11.5–15.5)
WBC: 8.5 10*3/uL (ref 4.0–10.5)
nRBC: 0 % (ref 0.0–0.2)

## 2021-05-24 LAB — BASIC METABOLIC PANEL
Anion gap: 7 (ref 5–15)
BUN: 18 mg/dL (ref 8–23)
CO2: 25 mmol/L (ref 22–32)
Calcium: 8.9 mg/dL (ref 8.9–10.3)
Chloride: 106 mmol/L (ref 98–111)
Creatinine, Ser: 0.86 mg/dL (ref 0.61–1.24)
GFR, Estimated: >60 mL/min (ref >60)
Glucose, Bld: 111 mg/dL — ABNORMAL HIGH (ref 70–99)
Potassium: 4.7 mmol/L (ref 3.5–5.1)
Sodium: 138 mmol/L (ref 135–145)

## 2021-05-24 LAB — DIGOXIN LEVEL: Digoxin Level: 0.4 ng/mL — ABNORMAL LOW (ref 0.8–2.0)

## 2021-05-24 MED ORDER — FUROSEMIDE 20 MG PO TABS: 40.0000 mg | ORAL_TABLET | Freq: Every day | ORAL | 1 refills | Status: DC

## 2021-05-24 NOTE — Patient Instructions (Signed)
Great to see you today. Please take Furosemide (Lasix) 40 mg daily We did labwork today--we will only call you with abnormal values.  No news is good news! Follow-up with Cardiology appointment as previously scheduled.

## 2021-05-24 NOTE — Progress Notes (Signed)
HEART & VASCULAR TRANSITION OF CARE CONSULT NOTE     Referring Physician: Dr. Gardiner Rhyme Primary Care: Vivi Barrack, MD Primary Cardiologist: Pixie Casino, MD   HPI: Referred to clinic by Dr. Gardiner Rhyme for heart failure consultation.   80 y/o male w/ chronic systolic heart failure, 2/2 nonischemic CM. EFs have been in the 45-50% range since 2017. Had [facility] 05/2018 that showed 70% small OM branch, otherwise nonobstructive CAD. Other history includes T2DDM, HLD, PVD s/p rt BKA and h/o ETOH use. Also previously noted to have frequent PVCs in the past, has been followed by Dr. Debara Pickett.   Recently discovered to be in new onset Atrial Fibrillation w/ RVR, of unknown duration and a/c CHF. Echo showed slight reduction in LVEF, down to 40%. HS trop negative, 4 ng/L. Placed on rate control therapy w/ Coreg and digoxin, along w/ eliquis for a/c. He was hospitalized x 1 night and refused to stay for inpatient TEE/DCCV. He was discharged home, Referred to afib clinic and planned to undergo outpatient DCCV 06/08/21. He has been referred to [facility] for HF evaluation.   Here today for f/u. Remains in persistent Afib but asymptomatic. Denies palpitations. No significant dyspnea w/ ADLs but evidence of fluid overload on exam w/ 2+ LLEE. Denies orthopnea/ PND. Takes 20 mg lasix daily. BP stable 110/80. Pulse rate low 100s. Reports full compliance w/ Eliquis.   He appears confused at times, ? Mild dementia. He reports he lives alone but daughter and his "lady friend" stop in regularly to help w/ meds and household needs. I discussed arranging home health services but he declined.   Cardiac Testing  2D echo 12/22 Poor windows and tachycardic. Wall motion cannot be fully assessed. . Left ventricular ejection fraction, by estimation, is 40 to 45%. The left ventricle has mildly decreased function. There is mild left ventricular hypertrophy. 1. IVC not well visualized 2. to calculate RVSP. Right ventricular  systolic function is normal. 3. Left atrial size was moderately dilated. 4. Right atrial size was moderately dilated. 5. Trivial mitral valve regurgitation. 6. Aortic valve regurgitation is not visualized. 7. The inferior vena cava not well visualized.  Review of Systems: [y] = yes, [ ]  = no   General: Weight gain [ ] ; Weight loss [ ] ; Anorexia [ ] ; Fatigue [ ] ; Fever [ ] ; Chills [ ] ; Weakness [ ]   Cardiac: Chest pain/pressure [ ] ; Resting SOB [ ] ; Exertional SOB [ ] ; Orthopnea [ ] ; Pedal Edema [Y ]; Palpitations [ ] ; Syncope [ ] ; Presyncope [ ] ; Paroxysmal nocturnal dyspnea[ ]   Pulmonary: Cough [ ] ; Wheezing[ ] ; Hemoptysis[ ] ; Sputum [ ] ; Snoring [ ]   GI: Vomiting[ ] ; Dysphagia[ ] ; Melena[ ] ; Hematochezia [ ] ; Heartburn[ ] ; Abdominal pain [ ] ; Constipation [ ] ; Diarrhea [ ] ; BRBPR [ ]   GU: Hematuria[ ] ; Dysuria [ ] ; Nocturia[ ]   Vascular: Pain in legs with walking [ ] ; Pain in feet with lying flat [ ] ; Non-healing sores [ ] ; Stroke [ ] ; TIA [ ] ; Slurred speech [ ] ;  Neuro: Headaches[ ] ; Vertigo[ ] ; Seizures[ ] ; Paresthesias[ ] ;Blurred vision [ ] ; Diplopia [ ] ; Vision changes [ ]   Ortho/Skin: Arthritis [ ] ; Joint pain [ ] ; Muscle pain [ ] ; Joint swelling [ ] ; Back Pain [ ] ; Rash [ ]   Psych: Depression[ ] ; Anxiety[ ]   Heme: Bleeding problems [ ] ; Clotting disorders [ ] ; Anemia [ ]   Endocrine: Diabetes [ Y]; Thyroid dysfunction[ ]    Past Medical History:  Diagnosis Date   CAD (coronary artery disease)    a. Remote cath 2008 for chest pain showed 30% LAD with luminal irregularity and fairly heavy calcification in the mid LAD without critical stenosis, 30% OM1, 30% acute marginal.   Cancer (HCC)    skin cancer - basal   Cataract    both   Chronic combined systolic and diastolic CHF (congestive heart failure) (HCC)    Complication of anesthesia    Diabetes mellitus    Type II   ED (erectile dysfunction)    History of kidney stones    x1   Hypertension    NICM (nonischemic  cardiomyopathy) ([place])    a. EF 40-45% by echo in 08/2017 - prior low risk stress test in 2017.   Osteoarthritis    Paroxysmal SVT (supraventricular tachycardia) (HCC)    PONV (postoperative nausea and vomiting) 1960   with Ether   PVC's (premature ventricular contractions)    PVD (peripheral vascular disease) (HCC)    s/p R BKA   Rheumatic fever    Rhinitis    S/P BKA (below knee amputation), right (HCC)     Current Outpatient Medications  Medication Sig Dispense Refill   ACCU-CHEK AVIVA PLUS test strip USE TO TEST GLUCOSE ONCE DAILY E11.9 100 strip 11   Accu-Chek FastClix Lancets MISC USE TO CHECK BLOOD SUGAR DAILY AND AS NEEDED 102 each 4   apixaban (ELIQUIS) 5 MG TABS tablet Take 1 tablet (5 mg total) by mouth 2 (two) times daily. 60 tablet 0   carvedilol (COREG) 25 MG tablet TAKE 1 TABLET BY MOUTH TWICE A DAY (Patient taking differently: Take 25 mg by mouth in the morning and at bedtime.) 180 tablet 3   digoxin (LANOXIN) 0.125 MG tablet Take 1 tablet (0.125 mg total) by mouth daily. 30 tablet 0   doxycycline (VIBRA-TABS) 100 MG tablet Take 1 tablet (100 mg total) by mouth 2 (two) times daily. 20 tablet 0   gabapentin (NEURONTIN) 100 MG capsule TAKE 1 CAPSULE BY MOUTH THREE TIMES A DAY (Patient taking differently: Take 100 mg by mouth 2 (two) times daily.) 270 capsule 3   halobetasol (ULTRAVATE) 0.05 % cream APPLY TO AFFECTED AREA TWICE A DAY (Patient taking differently: Apply 1 application topically in the morning and at bedtime.) 50 g 2   metFORMIN (GLUCOPHAGE) 1000 MG tablet TAKE 1 TABLET BY MOUTH EVERY DAY WITH BREAKFAST (Patient taking differently: Take 1,000 mg by mouth daily.) 90 tablet 1   nystatin cream (MYCOSTATIN) APPLY TO AFFECTED AREA TWICE A DAY (Patient taking differently: Apply 1 application topically 2 (two) times daily.) 30 g 2   pantoprazole (PROTONIX) 40 MG tablet TAKE 1 TABLET BY MOUTH EVERY DAY (Patient taking differently: Take 40 mg by mouth daily.) 90 tablet 1    potassium chloride SA (KLOR-CON M20) 20 MEQ tablet TAKE 2 TABLETS BY MOUTH EVERY MORNING (Patient taking differently: Take 40 mEq by mouth daily.) 180 tablet 3   sacubitril-valsartan (ENTRESTO) 24-26 MG Take 1 tablet by mouth 2 (two) times daily. 60 tablet 0   sildenafil (VIAGRA) 100 MG tablet TAKE 1 TABLET DAILY AS NEEDED ED (Patient taking differently: Take 100 mg by mouth daily as needed for erectile dysfunction.) 4 tablet 22   solifenacin (VESICARE) 10 MG tablet Take 10 mg by mouth daily.     tamsulosin (FLOMAX) 0.4 MG CAPS capsule TAKE 1 CAPSULE BY MOUTH EVERY DAY (Patient taking differently: Take 0.4 mg by mouth daily.) 90 capsule 3  furosemide (LASIX) 20 MG tablet Take 2 tablets (40 mg total) by mouth daily. 90 tablet 1   No current facility-administered medications for this encounter.    Allergies  Allergen Reactions   Lisinopril Cough      Social History   Socioeconomic History   Marital status: Married    Spouse name: Not on file   Number of children: 1   Years of education: 16+   Highest education level: Not on file  Occupational History   Occupation: Curator - [employer]    Employer: RETIRED  Tobacco Use   Smoking status: Former    Years: 20.00    Types: Cigarettes    Quit date: 07/26/1981    Years since quitting: 39.8   Smokeless tobacco: Never  Vaping Use   Vaping Use: Never used  Substance and Sexual Activity   Alcohol use: Not Currently    Comment: occasional glass of wine   Drug use: No   Sexual activity: Not Currently  Other Topics Concern   Not on file  Social History Narrative   Lives with wife.   epworth sleepiness scale = 5 (01/20/16)   Social Determinants of Health   Financial Resource Strain: Medium Risk   Difficulty of Paying Living Expenses: Somewhat hard  Food Insecurity: No Food Insecurity   Worried About Charity fundraiser in the Last Year: Never true   Ran Out of Food in the Last Year: Never true  Transportation  Needs: No Transportation Needs   Lack of Transportation (Medical): No   Lack of Transportation (Non-Medical): No  Physical Activity: Not on file  Stress: Not on file  Social Connections: Not on file  Intimate Partner Violence: Not on file      Family History  Problem Relation Age of Onset   Stroke Father 23       Died age 40   Coronary artery disease Brother 29   Colon cancer Neg Hx    Colon polyps Neg Hx    Diabetes Neg Hx    Esophageal cancer Neg Hx    Kidney disease Neg Hx    Gallbladder disease Neg Hx     Vitals:   05/24/21 1518  BP: 110/80  Pulse: (!) 110  SpO2: 95%  Weight: 97.9 kg    PHYSICAL EXAM: General:  Well appearing elderly male ambulating w/ cane. No respiratory difficulty HEENT: normal Neck: supple. JVD 8 cm Carotids 2+ bilat; no bruits. No lymphadenopathy or thryomegaly appreciated. Cor: PMI nondisplaced. Irregularly irregular rhythm. No rubs, gallops or murmurs. Lungs: clear Abdomen: soft, nontender, nondistended. No hepatosplenomegaly. No bruits or masses. Good bowel sounds. Extremities: no cyanosis, clubbing, rash, 2+ LLE edema, s/p rt BKA w/ leg prosthesis  Neuro: alert & oriented x 3, cranial nerves grossly intact. moves all 4 extremities w/o difficulty. Affect pleasant.  ECG: not performed    ASSESSMENT & PLAN:  Chronic Systolic Heart Failure - he has had chronic systolic heart failure, dating back to 2017, 2/2 NICM, w/ mildly reduced EF's 45-50% range. LHC in 2020 w/ only mild-mod nonob CAD.  - Recent admit for a/c CHF in setting of new Afib w/ RVR of unknown duration. Echo w/ EF down to 35-40% by Dr. Newman Nickels read . RV normal  - Suspect drop in EF likely tachy mediated from Afib.  - Plan outpatient DCCV in 2 weeks  - Also h/o PVCs in the past. Would consider 14 day zio to quantify burden, after he undergo's cardioversion  -  NYHA Class II. Mildly fluid overloaded on exam w/ 2+ Leg edema - Increase Lasix to 40 mg daily  - Continue  Entresto 24-26 mg bid - Continue Coreg 25 mg bid - Continue digoxin 0.125 mg daily  - Check dig level today  - Check BMP today and again in 7 days   2. Persistent Atrial Fibrillation  - Rate controlled w/ coreg and digoxin - Eliquis for a/c - Planned DCCV w/ Dr. Marlou Porch in 2 weeks   3. CAD: -LHC in 2020 showed mod nonob CAD with 70% ostial stenosis and small first obtuse marginal, 40 % stenosis in the first diagonal, 50 % mid LAD stenosis, 40% mid RCA, 60% proximal PDA, and 50% left ventricular branch of right coronary. - stable w/o CP - no ASA w/ Eliquis - refuses statin   4. T2DM  - management per PCP  5. PVD - s/p Rt BKA   6. H/o frequent PVCs - noted in prior cardiology notes - most recently found to be in new Afib - after cardioversion, would consider 14 day Zio to quantify PVC burden, as this could also be contributory to CM - may ultimately need AAD therapy   NYHA II GDMT  Diuretic- Lasix 40 mg daily  BB- Coreg 25 mg bid  Ace/ARB/ARNI Entresto 24-26 mg bid  MRA No ? Compliance  SGLT2i No     Referred to HFSW (PCP, Medications, Transportation, ETOH Abuse, Drug Abuse, Insurance, Museum/gallery curator ): No  Refer to Pharmacy: No Refer to Home Health:  No (offered but patient declined)  Refer to Advanced Heart Failure Clinic: No  Refer to General Cardiology: No (followed by Dr. Debara Pickett)  Follow up: f/u w/ cardiology as planned. Appt scheduled post cardioversion

## 2021-05-25 ENCOUNTER — Telehealth (HOSPITAL_COMMUNITY): Payer: Self-pay | Admitting: Surgery

## 2021-05-25 ENCOUNTER — Ambulatory Visit: Payer: Medicare Other | Admitting: Podiatry

## 2021-05-25 NOTE — Telephone Encounter (Signed)
I called patient to review results and recommendations per Lyda Jester PA.  He was intent that he did not want to return to the HF Clinic for labs and will seek care at Dr. Lysbeth Penner office.  He plans to call them today or tomorrow to schedule an appt and will ask them about labs.

## 2021-05-25 NOTE — Telephone Encounter (Signed)
-----   Message from Consuelo Pandy, [place] sent at 05/25/2021 10:37 AM EST ----- Labs stable. Needs repeat BMP in 1 week given lasix increase.

## 2021-05-26 ENCOUNTER — Other Ambulatory Visit (HOSPITAL_COMMUNITY): Payer: Self-pay

## 2021-05-26 ENCOUNTER — Telehealth: Payer: Self-pay

## 2021-05-26 NOTE — Telephone Encounter (Signed)
Patient calling back about xray results.

## 2021-05-26 NOTE — Telephone Encounter (Signed)
Rx results given

## 2021-05-29 ENCOUNTER — Other Ambulatory Visit (HOSPITAL_COMMUNITY): Payer: Self-pay

## 2021-05-29 NOTE — Telephone Encounter (Signed)
Transitions of Care Pharmacy   Call attempted for a pharmacy transitions of care follow-up. HIPAA appropriate voicemail was left.   Call attempt #1. Will follow-up in 2-3 days.   Parthenia Ames, PharmD

## 2021-05-30 ENCOUNTER — Telehealth: Payer: Self-pay | Admitting: Internal Medicine

## 2021-05-30 ENCOUNTER — Telehealth (HOSPITAL_COMMUNITY): Payer: Self-pay

## 2021-05-30 ENCOUNTER — Encounter (HOSPITAL_COMMUNITY): Payer: Self-pay | Admitting: Cardiology

## 2021-05-30 NOTE — Telephone Encounter (Signed)
Pharmacy Transitions of Care Follow-up Telephone Call  Date of discharge: 05/12/2021  Discharge Diagnosis: Afib  How have you been since you were released from the hospital?  Patient is well since discharge but feels he is tasking way too many medications. He has been trying to reach Cardiologist because he has a procedure coming up and does not know what it is. Office was closed yesterday. Informed patient the procedure was a cardioversion. Patient wiol call cardiologist office after finishing this call.  Medication changes made at discharge:  START taking: digoxin (LANOXIN)  Eliquis (apixaban)  Entresto (sacubitril-valsartan)  This replaces a similar medication. See the full medication list for instructions. STOP taking: aspirin 325 MG tablet  aspirin-sod bicarb-citric acid 325 MG Tbef tablet (ALKA-SELTZER)  doxycycline 100 MG tablet (VIBRA-TABS)  Entresto 49-51 MG (sacubitril-valsartan)  Replaced by a similar medication. LORazepam 0.5 MG tablet (ATIVAN)  metroNIDAZOLE 0.75 % cream (METROCREAM)  tadalafil 10 MG tablet (Cialis)   Medication changes verified by the patient? Yes    Medication Accessibility:  Home Pharmacy: [facility] [address] [place], [place], [place]  Was the patient provided with refills on discharged medications? No   Have all prescriptions been transferred from [facility] to home pharmacy? N/A   Is the patient able to afford medications? Patient may need assistance from cardiology office for patient assistance. Notable copays: Eliquis $141.49/30 days    Medication Review:   APIXABAN (ELIQUIS)  Apixaban 5 mg BID  - Discussed importance of taking medication around the same time everyday  - Reviewed potential DDIs with patient  - Advised patient of medications to avoid (NSAIDs, ASA)  - Educated that Tylenol (acetaminophen) will be the preferred analgesic to prevent risk of bleeding  - Emphasized importance of monitoring for signs and symptoms of  bleeding (abnormal bruising, prolonged bleeding, nose bleeds, bleeding from gums, discolored urine, black tarry stools)  - Advised patient to alert all providers of anticoagulation therapy prior to starting a new medication or having a procedure    Follow-up Appointments:  PCP Hospital f/u appt confirmed?  Dimas Chyle on 05/15/2021 @ 3:40 pm. [facility] f/u appt confirmed?  Scheduled to see Doristine Devoid on 05/18/2021 @ 10:30 am and [name] on 06/21/2021 @ 10:55 am.   If their condition worsens, is the pt aware to call PCP or go to the Emergency Dept.? Yes  Final Patient Assessment: Patient has f/u scheduled and knows to get refills from f/u.

## 2021-05-30 NOTE — Progress Notes (Signed)
Attempted to obtain medical history via telephone, unable to reach at this time. I left a voicemail to return pre surgical testing department's phone call.  

## 2021-05-30 NOTE — Telephone Encounter (Signed)
[name] is calling requesting to speak with a nurse about his cardioversion that is scheduled for 06/08/21. He states the hospital has called him several times and he is unsure what it even is that he is having done. Please advise.

## 2021-05-30 NOTE — Telephone Encounter (Signed)
Called patient about scheduled cardioversion coming up - he said he was unaware of what it was for. I explained the procedure in detail along with the rationale for it and he is agreeable to proceed. Instructed him not to miss any doses of the Eliquis  Dr Lemmie Evens

## 2021-05-30 NOTE — Telephone Encounter (Signed)
Spoke to patient stated he already had cbc and bmet last week. Stated he was already given instructions.Cardioversion instructions reviewed.He understands not to miss any Eliquis doses.Advised to keep post hospital follow up appointment with [name] 1/25 at 10:55 am.

## 2021-06-04 ENCOUNTER — Encounter (HOSPITAL_BASED_OUTPATIENT_CLINIC_OR_DEPARTMENT_OTHER): Payer: Medicare Other | Admitting: Internal Medicine

## 2021-06-05 ENCOUNTER — Other Ambulatory Visit: Payer: Self-pay | Admitting: Family Medicine

## 2021-06-05 ENCOUNTER — Telehealth: Payer: Self-pay | Admitting: Internal Medicine

## 2021-06-05 NOTE — Telephone Encounter (Signed)
Patient is calling about  his Entresto. He states he has two script for the same medication just different dosage.  sacubitril-valsartan (ENTRESTO) 24-26 MG sacubitril-valsartan (ENTRESTO) 49-51 MG

## 2021-06-05 NOTE — Telephone Encounter (Signed)
Returned the call to the patient. He was calling to verify which entresto he should be on. According to his last hospital discharge, his entresto was switched to the 24-26 mg bid due to low blood pressure. The patient stated that he has been taking the 49-51 mg bid and never switched to the lower dosage.  Blood pressures before medications:  117/79 today  114/82- 1/1 117/77- 1/2 132/86- 1/3 134/85 -1/4 116/72-1/5 115/72- 1/6 127/83- 1/7  He has been advised to check his blood pressure one to two hours after he takes his medication so that we can see how the medication is affecting his readings.  He has also been educated on the cardioversion and the importance of not missing a dose of the Eliquis. He stated that he has not.

## 2021-06-06 NOTE — Telephone Encounter (Signed)
I will refill once but he needs to schedule appointment if his infection has not cleared.

## 2021-06-07 ENCOUNTER — Other Ambulatory Visit (HOSPITAL_COMMUNITY): Payer: Self-pay | Admitting: *Deleted

## 2021-06-07 DIAGNOSIS — I4819 Other persistent atrial fibrillation: Secondary | ICD-10-CM

## 2021-06-07 NOTE — Telephone Encounter (Signed)
Patient has been made aware and verbalized his understanding.  

## 2021-06-07 NOTE — Telephone Encounter (Signed)
Left a message for the patient to call back.  

## 2021-06-07 NOTE — Telephone Encounter (Signed)
Based on those blood pressures, stay on the 49/51 mg BID dose  Thanks.

## 2021-06-08 ENCOUNTER — Ambulatory Visit (HOSPITAL_COMMUNITY): Payer: Medicare Other | Admitting: Certified Registered Nurse Anesthetist

## 2021-06-08 ENCOUNTER — Other Ambulatory Visit: Payer: Self-pay

## 2021-06-08 ENCOUNTER — Ambulatory Visit (HOSPITAL_COMMUNITY)
Admission: RE | Admit: 2021-06-08 | Discharge: 2021-06-08 | Disposition: A | Discharge status: Home / Self Care | Payer: Medicare Other | Attending: Cardiology | Admitting: Cardiology

## 2021-06-08 ENCOUNTER — Encounter (HOSPITAL_COMMUNITY)
Admission: RE | Disposition: A | Discharge status: Home / Self Care | Payer: Self-pay | Source: Home / Self Care | Attending: Cardiology

## 2021-06-08 ENCOUNTER — Encounter (HOSPITAL_COMMUNITY): Payer: Self-pay | Admitting: Cardiology

## 2021-06-08 DIAGNOSIS — Z87891 Personal history of nicotine dependence: Secondary | ICD-10-CM | POA: Insufficient documentation

## 2021-06-08 DIAGNOSIS — I509 Heart failure, unspecified: Secondary | ICD-10-CM | POA: Diagnosis not present

## 2021-06-08 DIAGNOSIS — K219 Gastro-esophageal reflux disease without esophagitis: Secondary | ICD-10-CM | POA: Diagnosis not present

## 2021-06-08 DIAGNOSIS — E119 Type 2 diabetes mellitus without complications: Secondary | ICD-10-CM | POA: Insufficient documentation

## 2021-06-08 DIAGNOSIS — I4819 Other persistent atrial fibrillation: Secondary | ICD-10-CM

## 2021-06-08 DIAGNOSIS — I4891 Unspecified atrial fibrillation: Secondary | ICD-10-CM | POA: Diagnosis not present

## 2021-06-08 DIAGNOSIS — I5042 Chronic combined systolic (congestive) and diastolic (congestive) heart failure: Secondary | ICD-10-CM | POA: Insufficient documentation

## 2021-06-08 DIAGNOSIS — M1990 Unspecified osteoarthritis, unspecified site: Secondary | ICD-10-CM | POA: Diagnosis not present

## 2021-06-08 DIAGNOSIS — Z79899 Other long term (current) drug therapy: Secondary | ICD-10-CM | POA: Diagnosis not present

## 2021-06-08 DIAGNOSIS — I2510 Atherosclerotic heart disease of native coronary artery without angina pectoris: Secondary | ICD-10-CM | POA: Insufficient documentation

## 2021-06-08 DIAGNOSIS — Z7984 Long term (current) use of oral hypoglycemic drugs: Secondary | ICD-10-CM | POA: Insufficient documentation

## 2021-06-08 DIAGNOSIS — I110 Hypertensive heart disease with heart failure: Secondary | ICD-10-CM | POA: Diagnosis not present

## 2021-06-08 HISTORY — PX: CARDIOVERSION: SHX1299

## 2021-06-08 LAB — GLUCOSE, CAPILLARY: Glucose-Capillary: 109 mg/dL — ABNORMAL HIGH (ref 70–99)

## 2021-06-08 SURGERY — CARDIOVERSION
Anesthesia: General

## 2021-06-08 MED ORDER — LIDOCAINE 2% (20 MG/ML) 5 ML SYRINGE
INTRAMUSCULAR | Status: DC | PRN
Start: 2021-06-08 — End: 2021-06-08
  Administered 2021-06-08: 60 mg via INTRAVENOUS

## 2021-06-08 MED ORDER — PROPOFOL 10 MG/ML IV BOLUS
INTRAVENOUS | Status: DC | PRN
  Administered 2021-06-08: 50 mg via INTRAVENOUS

## 2021-06-08 MED ORDER — SODIUM CHLORIDE 0.9 % IV SOLN
INTRAVENOUS | Status: AC | PRN
  Administered 2021-06-08: 500 mL via INTRAVENOUS

## 2021-06-08 NOTE — Anesthesia Procedure Notes (Signed)
Procedure Name: General with mask airway Date/Time: 06/08/2021 10:59 AM Performed by: Reece Agar, CRNA Pre-anesthesia Checklist: Patient identified, Emergency Drugs available, Suction available, Patient being monitored and Timeout performed Patient Re-evaluated:Patient Re-evaluated prior to induction Oxygen Delivery Method: Ambu bag Preoxygenation: Pre-oxygenation with 100% oxygen Induction Type: IV induction

## 2021-06-08 NOTE — Transfer of Care (Signed)
Immediate Anesthesia Transfer of Care Note  Patient: [name]  Procedure(s) Performed: CARDIOVERSION  Patient Location: PACU  Anesthesia Type:General  Level of Consciousness: awake and alert   Airway & Oxygen Therapy: Patient Spontanous Breathing  Post-op Assessment: Report given to RN and Post -op Vital signs reviewed and stable  Post vital signs: Reviewed and stable  Last Vitals:  Vitals Value Taken Time  BP 135/82 06/08/2021 11:09  Temp    Pulse 86 06/08/2021 11:09  Resp 18 06/08/2021 11:09  SpO2 99 06/08/2021 11:09    Last Pain:  Vitals:   06/08/21 0849  TempSrc: Oral  PainSc: 0-No pain         Complications: No notable events documented.

## 2021-06-08 NOTE — CV Procedure (Signed)
° ° °  Electrical Cardioversion Procedure Note [name] [id number] 1941/03/14  Procedure: Electrical Cardioversion Indications:  Atrial Fibrillation  Time Out: Verified patient identification, verified procedure,medications/allergies/relevent history reviewed, required imaging and test results available.  Performed  Procedure Details  The patient was NPO after midnight. Anesthesia was administered at the beside  by Dr.Turk with  propofol.  Cardioversion was performed with synchronized biphasic defibrillation via AP pads with 200 joules.  1 attempt(s) were performed.  The patient converted to normal sinus rhythm. The patient tolerated the procedure well   IMPRESSION:  Successful cardioversion of atrial fibrillation. Relayed to [name] his daughter.     [name] 06/08/2021, 11:08 AM

## 2021-06-08 NOTE — Anesthesia Preprocedure Evaluation (Signed)
Anesthesia Evaluation  Patient identified by MRN, date of birth, ID band Patient awake    Reviewed: Allergy & Precautions, NPO status , Patient's Chart, lab work & pertinent test results, reviewed documented beta blocker date and time   History of Anesthesia Complications (+) PONV and history of anesthetic complications  Airway Mallampati: II  TM Distance: >3 FB Neck ROM: Full    Dental  (+) Teeth Intact, Dental Advisory Given   Pulmonary former smoker,    Pulmonary exam normal breath sounds clear to auscultation       Cardiovascular hypertension, Pt. on home beta blockers and Pt. on medications + CAD, + Peripheral Vascular Disease and +CHF  + dysrhythmias Atrial Fibrillation and Supra Ventricular Tachycardia  Rhythm:Irregular Rate:Abnormal     Neuro/Psych PSYCHIATRIC DISORDERS Anxiety  Neuromuscular disease    GI/Hepatic Neg liver ROS, GERD  Medicated,  Endo/Other  diabetes, Type 2, Oral Hypoglycemic Agents  Renal/GU negative Renal ROS     Musculoskeletal  (+) Arthritis ,   Abdominal   Peds  Hematology  (+) Blood dyscrasia (Eliquis), ,   Anesthesia Other Findings Day of surgery medications reviewed with the patient.  Reproductive/Obstetrics                             Anesthesia Physical Anesthesia Plan  ASA: 3  Anesthesia Plan: General   Post-op Pain Management:    Induction: Intravenous  PONV Risk Score and Plan: 3 and TIVA  Airway Management Planned: Natural Airway and Mask  Additional Equipment:   Intra-op Plan:   Post-operative Plan:   Informed Consent: I have reviewed the patients History and Physical, chart, labs and discussed the procedure including the risks, benefits and alternatives for the proposed anesthesia with the patient or authorized representative who has indicated his/her understanding and acceptance.     Dental advisory given  Plan Discussed with:  CRNA  Anesthesia Plan Comments:         Anesthesia Quick Evaluation

## 2021-06-08 NOTE — Interval H&P Note (Signed)
History and Physical Interval Note:  06/08/2021 8:47 AM  [name]  has presented today for surgery, with the diagnosis of A-FIB.  The various methods of treatment have been discussed with the patient and family. After consideration of risks, benefits and other options for treatment, the patient has consented to  Procedure(s): CARDIOVERSION (N/A) as a surgical intervention.  The patient's history has been reviewed, patient examined, no change in status, stable for surgery.  I have reviewed the patient's chart and labs.  Questions were answered to the patient's satisfaction.     [employer]

## 2021-06-09 ENCOUNTER — Encounter (HOSPITAL_COMMUNITY): Payer: Self-pay | Admitting: Cardiology

## 2021-06-09 NOTE — Anesthesia Postprocedure Evaluation (Signed)
Anesthesia Post Note  Patient: [name]  Procedure(s) Performed: CARDIOVERSION     Patient location during evaluation: Endoscopy Anesthesia Type: General Level of consciousness: awake and alert Pain management: pain level controlled Vital Signs Assessment: post-procedure vital signs reviewed and stable Respiratory status: spontaneous breathing, nonlabored ventilation, respiratory function stable and patient connected to nasal cannula oxygen Cardiovascular status: blood pressure returned to baseline and stable Postop Assessment: no apparent nausea or vomiting Anesthetic complications: no   No notable events documented.  Last Vitals:  Vitals:   06/08/21 1130 06/08/21 1135  BP:  (!) 149/77  Pulse: 85 90  Resp: 19 17  Temp:    SpO2: 99% 100%    Last Pain:  Vitals:   06/08/21 1135  TempSrc:   PainSc: 0-No pain   Pain Goal:                   Catalina Gravel

## 2021-06-20 ENCOUNTER — Telehealth: Payer: Self-pay | Admitting: Family Medicine

## 2021-06-20 ENCOUNTER — Other Ambulatory Visit: Payer: Self-pay | Admitting: Family Medicine

## 2021-06-20 ENCOUNTER — Other Ambulatory Visit: Payer: Self-pay

## 2021-06-20 MED ORDER — APIXABAN 5 MG PO TABS: 5.0000 mg | ORAL_TABLET | Freq: Two times a day (BID) | ORAL | 5 refills | Status: DC

## 2021-06-20 NOTE — Telephone Encounter (Signed)
Pt called requesting a refill but (per pt) can't get anyone to answer the phone at [facility].

## 2021-06-20 NOTE — Telephone Encounter (Signed)
Prescription refill request for Eliquis received. Indication: afib  Last office visit: 10/11/2020, hilty Scr: 0.86, 05/12/2021 Age: 81 yo  Weight: 97.9 kg   Refill sent.

## 2021-06-20 NOTE — Telephone Encounter (Signed)
Pt stated he is needing a refill for 2 prescriptions.   Doxycycline (Vibra-Tabs) 100 mg  Apixaban (Eliquis) 5 mg tablet  He is having an issue getting the Eliquis due to he is unable to get a hold of the prescribing physician. He is wanting to know what he should do.

## 2021-06-20 NOTE — Telephone Encounter (Signed)
Left message to return call to our office at their convenience.   Rx was send to his pharmacy

## 2021-06-21 ENCOUNTER — Other Ambulatory Visit: Payer: Self-pay

## 2021-06-21 ENCOUNTER — Encounter: Payer: Self-pay | Admitting: Physician Assistant

## 2021-06-21 ENCOUNTER — Ambulatory Visit (INDEPENDENT_AMBULATORY_CARE_PROVIDER_SITE_OTHER): Payer: Medicare Other | Admitting: Physician Assistant

## 2021-06-21 VITALS — BP 116/62 | HR 62 | Ht 72.0 in | Wt 214.8 lb

## 2021-06-21 DIAGNOSIS — Z89511 Acquired absence of right leg below knee: Secondary | ICD-10-CM

## 2021-06-21 DIAGNOSIS — I4819 Other persistent atrial fibrillation: Secondary | ICD-10-CM

## 2021-06-21 DIAGNOSIS — E785 Hyperlipidemia, unspecified: Secondary | ICD-10-CM

## 2021-06-21 DIAGNOSIS — I10 Essential (primary) hypertension: Secondary | ICD-10-CM | POA: Diagnosis not present

## 2021-06-21 DIAGNOSIS — I7121 Aneurysm of the ascending aorta, without rupture: Secondary | ICD-10-CM | POA: Diagnosis not present

## 2021-06-21 DIAGNOSIS — I5022 Chronic systolic (congestive) heart failure: Secondary | ICD-10-CM | POA: Diagnosis not present

## 2021-06-21 DIAGNOSIS — I2510 Atherosclerotic heart disease of native coronary artery without angina pectoris: Secondary | ICD-10-CM

## 2021-06-21 LAB — BASIC METABOLIC PANEL
BUN/Creatinine Ratio: 15 (ref 10–24)
BUN: 12 mg/dL (ref 8–27)
CO2: 26 mmol/L (ref 20–29)
Calcium: 9.1 mg/dL (ref 8.6–10.2)
Chloride: 103 mmol/L (ref 96–106)
Creatinine, Ser: 0.78 mg/dL (ref 0.76–1.27)
Glucose: 115 mg/dL — ABNORMAL HIGH (ref 70–99)
Potassium: 4.3 mmol/L (ref 3.5–5.2)
Sodium: 140 mmol/L (ref 134–144)
eGFR: 90 mL/min/{1.73_m2} (ref >59)

## 2021-06-21 NOTE — Progress Notes (Signed)
Cardiology Office Note:    Date:  06/23/2021   ID:  [name], DOB Sep 10, 1940, MRN [id number]  PCP:  Vivi Barrack, MD   [place] [facility] Cardiologist:  Pixie Casino, MD     Referring MD: Vivi Barrack, MD   Chief Complaint  Patient presents with   Follow-up    Seen for Dr. Debara Pickett    History of Present Illness:    [name] is a 81 y.o. male with a hx of CAD, hypertension, hyperlipidemia, DM2, TAA, right BKA and history of A. fib.  Remote cardiac catheterization in 2018 showed minimal coronary artery disease.  Patient was previously seen by Dr. Percival Spanish in 2013 for PVCs.  Myoview at the time showed no evidence of ischemia.  Heart monitor obtained in August 2017 showed frequent episode of SVT.  Echocardiogram obtained in 2017 showed EF 45 to 50%, basal inferior akinesis and inferior hypokinesis, grade 2 DD.  Subsequent Myoview obtained in September 2017 was low risk with no ischemia.  By April 2019, echocardiogram shows EF down to 40 to 45%, moderate LVH, moderate RV hypokinesis.  He was placed on Entresto, however was unable to tolerate the higher dose due to frequent urination.  Cardiac catheterization performed on 06/03/2018 showed 70% ostial OM1, 40% D1, 50% mid LAD, 40% mid RCA, 60% proximal PDA, and 50% LV branch of RCA, normal LVEDP.  Dr. Debara Pickett was concerned that his drop in the ejection fraction is related to PVC mediated cardiomyopathy, and recommend a heart monitor, however patient declined to wear a heart monitor at the time.  Most recent echocardiogram obtained on 11/14/2020 showed EF 45 to 50%, global hypokinesis, normal pulmonary artery systolic pressure, moderate biatrial enlargement, moderately dilated ascending aorta measuring at 45 mm.  More recently, patient was admitted to the hospital on 05/11/2021 after presented to the ED with left foot bleeding however found to be in atrial flutter with RVR of unknown duration.  He was started on Eliquis.  Digoxin was  added for rate control.  Entresto held due to soft BP.  He eventually underwent successful cardioversion on 06/08/2021 per Dr. Marlou Porch.  Patient presents today for post cardioversion follow-up.  EKG shows he is maintaining normal sinus rhythm.  He does have 2+ pitting edema in the distal left lower extremity, I recommended leg elevation and continue on the current dose of diuretic.  I also recommended repeat a basic metabolic panel as well.  His major complaint is he is on too much medications.  However he is only on 5 cardiac medication include Eliquis, carvedilol, Lasix, potassium and Entresto.  I do not think we can reduce his cardiac medication any further.  He was on digoxin before, however he is no longer on digoxin at this point.  Given stable heart rate, I did not try to re-initiate digoxin.  Patient was recently prescribed doxycycline by his PCP, I am not sure why doxycycline was prescribed.  I will message Dr. Jerline Pain to clarify.  He was only prescribed 28 tablet of doxycycline.  Otherwise, he can follow-up with Dr. Debara Pickett in 3 months.   Past Medical History:  Diagnosis Date   CAD (coronary artery disease)    a. Remote cath 2008 for chest pain showed 30% LAD with luminal irregularity and fairly heavy calcification in the mid LAD without critical stenosis, 30% OM1, 30% acute marginal.   Cancer (HCC)    skin cancer - basal   Cataract    both  Chronic combined systolic and diastolic CHF (congestive heart failure) (HCC)    Complication of anesthesia    Diabetes mellitus    Type II   ED (erectile dysfunction)    History of kidney stones    x1   Hypertension    NICM (nonischemic cardiomyopathy) ([place])    a. EF 40-45% by echo in 08/2017 - prior low risk stress test in 2017.   Osteoarthritis    Paroxysmal SVT (supraventricular tachycardia) (HCC)    PONV (postoperative nausea and vomiting) 1960   with Ether   PVC's (premature ventricular contractions)    PVD (peripheral vascular disease)  (HCC)    s/p R BKA   Rheumatic fever    Rhinitis    S/P BKA (below knee amputation), right [facility])     Past Surgical History:  Procedure Laterality Date   AMPUTATION Right 02/29/2016   Procedure: Right Leg AMPUTATION BELOW KNEE with Wound Vac placement;  Surgeon: [name], MD;  Location: [place];  Service: Orthopedics;  Laterality: Right;   CARDIOVERSION N/A 06/08/2021   Procedure: CARDIOVERSION;  Surgeon: Jerline Pain, MD;  Location: [facility] ENDOSCOPY;  Service: Cardiovascular;  Laterality: N/A;   CHOLECYSTECTOMY  1983   colonoscopy  2006, 2010   EYE SURGERY Bilateral    cataract   INGUINAL HERNIA REPAIR Right 1960   LEFT HEART CATH AND CORONARY ANGIOGRAPHY N/A 06/03/2018   Procedure: LEFT HEART CATH AND CORONARY ANGIOGRAPHY;  Surgeon: Belva Crome, MD;  Location: [place] CV LAB;  Service: Cardiovascular;  Laterality: N/A;   STUMP REVISION Right 10/30/2017   Procedure: RIGHT BELOW KNEE AMPUTATION REVISION;  Surgeon: [name], MD;  Location: [place];  Service: Orthopedics;  Laterality: Right;    Current Medications: Current Meds  Medication Sig   ACCU-CHEK AVIVA PLUS test strip USE TO TEST GLUCOSE ONCE DAILY E11.9   Accu-Chek FastClix Lancets MISC USE TO CHECK BLOOD SUGAR DAILY AND AS NEEDED   acetaminophen (TYLENOL) 500 MG tablet Take 500-1,000 mg by mouth every 6 (six) hours as needed (pain).   apixaban (ELIQUIS) 5 MG TABS tablet Take 1 tablet (5 mg total) by mouth 2 (two) times daily.   carvedilol (COREG) 25 MG tablet TAKE 1 TABLET BY MOUTH TWICE A DAY (Patient taking differently: Take 25 mg by mouth in the morning and at bedtime.)   doxycycline (VIBRA-TABS) 100 MG tablet TAKE 1 TABLET BY MOUTH TWICE A DAY   furosemide (LASIX) 20 MG tablet Take 2 tablets (40 mg total) by mouth daily. (Patient taking differently: Take 20 mg by mouth 2 (two) times daily.)   gabapentin (NEURONTIN) 100 MG capsule TAKE 1 CAPSULE BY MOUTH THREE TIMES A DAY (Patient taking differently: Take 100 mg by  mouth 2 (two) times daily.)   halobetasol (ULTRAVATE) 0.05 % cream APPLY TO AFFECTED AREA TWICE A DAY (Patient taking differently: Apply 1 application topically 2 (two) times daily as needed (skin irritation).)   metFORMIN (GLUCOPHAGE) 1000 MG tablet TAKE 1 TABLET BY MOUTH EVERY DAY WITH BREAKFAST (Patient taking differently: Take 1,000 mg by mouth daily.)   nystatin cream (MYCOSTATIN) APPLY TO AFFECTED AREA TWICE A DAY (Patient taking differently: Apply 1 application topically 2 (two) times daily as needed (skin irritation).)   pantoprazole (PROTONIX) 40 MG tablet TAKE 1 TABLET BY MOUTH EVERY DAY   potassium chloride SA (KLOR-CON M20) 20 MEQ tablet TAKE 2 TABLETS BY MOUTH EVERY MORNING   sacubitril-valsartan (ENTRESTO) 49-51 MG Take 1 tablet by mouth 2 (two)  times daily.   sildenafil (VIAGRA) 100 MG tablet TAKE 1 TABLET DAILY AS NEEDED ED (Patient taking differently: Take 100 mg by mouth daily as needed for erectile dysfunction.)   solifenacin (VESICARE) 10 MG tablet Take 10 mg by mouth daily.   tamsulosin (FLOMAX) 0.4 MG CAPS capsule TAKE 1 CAPSULE BY MOUTH EVERY DAY (Patient taking differently: Take 0.4 mg by mouth daily after breakfast.)     Allergies:   Lisinopril   Social History   Socioeconomic History   Marital status: Married    Spouse name: Not on file   Number of children: 1   Years of education: 16+   Highest education level: Not on file  Occupational History   Occupation: Curator - [employer]    Employer: RETIRED  Tobacco Use   Smoking status: Former    Years: 20.00    Types: Cigarettes    Quit date: 07/26/1981    Years since quitting: 39.9   Smokeless tobacco: Never  Vaping Use   Vaping Use: Never used  Substance and Sexual Activity   Alcohol use: Not Currently    Comment: occasional glass of wine   Drug use: No   Sexual activity: Not Currently  Other Topics Concern   Not on file  Social History Narrative   Lives with wife.   epworth  sleepiness scale = 5 (01/20/16)   Social Determinants of Health   Financial Resource Strain: Medium Risk   Difficulty of Paying Living Expenses: Somewhat hard  Food Insecurity: No Food Insecurity   Worried About Charity fundraiser in the Last Year: Never true   Ran Out of Food in the Last Year: Never true  Transportation Needs: No Transportation Needs   Lack of Transportation (Medical): No   Lack of Transportation (Non-Medical): No  Physical Activity: Not on file  Stress: Not on file  Social Connections: Not on file     Family History: The patient's family history includes Coronary artery disease (age of onset: 4) in his brother; Stroke (age of onset: 71) in his father. There is no history of Colon cancer, Colon polyps, Diabetes, Esophageal cancer, Kidney disease, or Gallbladder disease.  ROS:   Please see the history of present illness.     All other systems reviewed and are negative.  EKGs/Labs/Other Studies Reviewed:    The following studies were reviewed today:  Limited echo 05/12/2021  1. Poor windows and tachycardic. Wall motion cannot be fully assessed. .  Left ventricular ejection fraction, by estimation, is 40 to 45%. The left  ventricle has mildly decreased function. There is mild left ventricular  hypertrophy.   2. IVC not well visualized to calculate RVSP. Right ventricular systolic  function is normal.   3. Left atrial size was moderately dilated.   4. Right atrial size was moderately dilated.   5. Trivial mitral valve regurgitation.   6. Aortic valve regurgitation is not visualized.   7. The inferior vena cava not well visualized.   Conclusion(s)/Recommendation(s): Consider repeat limited study wheh heart  rate is normalized. Compared to prior echo 11/14/2020 HR is significantly  more elevated, grossly no significant differences.  EKG:  EKG is ordered today.  The ekg ordered today demonstrates normal sinus rhythm, no significant ST-T wave changes  Recent  Labs: 11/21/2020: ALT 9 05/11/2021: TSH 1.958 05/24/2021: Hemoglobin 13.8; Platelets 231 06/21/2021: BUN 12; Creatinine, Ser 0.78; Potassium 4.3; Sodium 140  Recent Lipid Panel    Component Value Date/Time   CHOL  148 09/04/2019 0918   TRIG 94.0 09/04/2019 0918   HDL 36.00 (L) 09/04/2019 0918   CHOLHDL 4 09/04/2019 0918   VLDL 18.8 09/04/2019 0918   LDLCALC 93 09/04/2019 0918   LDLDIRECT 84.0 09/10/2016 1614     Risk Assessment/Calculations:    CHA2DS2-VASc Score = 5   This indicates a 7.2% annual risk of stroke. The patient's score is based upon: CHF History: 1 HTN History: 1 Diabetes History: 0 Stroke History: 0 Vascular Disease History: 1 Age Score: 2 Gender Score: 0          Physical Exam:    VS:  BP 116/62    Pulse 62    Ht 6' (1.829 m)    Wt 214 lb 12.8 oz (97.4 kg)    SpO2 96%    BMI 29.13 kg/m     Wt Readings from Last 3 Encounters:  06/21/21 214 lb 12.8 oz (97.4 kg)  06/08/21 215 lb 12.8 oz (97.9 kg)  05/24/21 215 lb 12.8 oz (97.9 kg)     GEN:  Well nourished, well developed in no acute distress HEENT: Normal NECK: No JVD; No carotid bruits LYMPHATICS: No lymphadenopathy CARDIAC: RRR, no murmurs, rubs, gallops RESPIRATORY:  Clear to auscultation without rales, wheezing or rhonchi  ABDOMEN: Soft, non-tender, non-distended MUSCULOSKELETAL:  No edema; No deformity  SKIN: Warm and dry NEUROLOGIC:  Alert and oriented x 3 PSYCHIATRIC:  Normal affect   ASSESSMENT:    1. Persistent atrial fibrillation ([place])   2. Coronary artery disease involving native coronary artery of native heart without angina pectoris   3. Chronic systolic heart failure ([place])   4. Essential hypertension   5. Hyperlipidemia LDL goal <70   6. Aneurysm of ascending aorta without rupture   7. Hx of right BKA ([place])    PLAN:    In order of problems listed above:  Persistent atrial fibrillation: Recently underwent cardioversion.  Currently maintaining sinus rhythm.  He is no longer  on digoxin.  I am not sure when it was discontinued, however since he is maintaining sinus rhythm, I did not try to reinitiate digoxin.  We will continue Eliquis and carvedilol.  CAD: Denies any recent chest pain  Chronic systolic heart failure: On carvedilol and Entresto.  Obtain basic metabolic panel  Hypertension: Continue carvedilol and Entresto  Hyperlipidemia: He is not on statin for some reason, I did not discuss this with him during today's visit, will need to readdress on the next follow-up.  Thoracic aortic aneurysm: Previous echocardiogram in June 2022 demonstrated dilated ascending aorta measuring at 45 mm  History of right BKA: No recent history       Medication Adjustments/Labs and Tests Ordered: Current medicines are reviewed at length with the patient today.  Concerns regarding medicines are outlined above.  Orders Placed This Encounter  Procedures   Basic Metabolic Panel (BMET)   EKG 12-Lead   No orders of the defined types were placed in this encounter.   Patient Instructions  Medication Instructions:  Your physician recommends that you continue on your current medications as directed. Please refer to the Current Medication list given to you today.   *If you need a refill on your cardiac medications before your next appointment, please call your pharmacy*   Lab Work: Your physician recommends that you complete lab work today BMET  If you have labs (blood work) drawn today and your tests are completely normal, you will receive your results only by: MyChart Message (if you have  MyChart) OR A paper copy in the mail If you have any lab test that is abnormal or we need to change your treatment, we will call you to review the results.   Testing/Procedures: NONE ordered at this time of appointment     Follow-Up: At [facility], you and your health needs are our priority.  As part of our continuing mission to provide you with exceptional heart care, we  have created designated Provider Care Teams.  These Care Teams include your primary Cardiologist (physician) and Advanced Practice Providers (APPs -  Physician Assistants and Nurse Practitioners) who all work together to provide you with the care you need, when you need it.  We recommend signing up for the patient portal called "MyChart".  Sign up information is provided on this After Visit Summary.  MyChart is used to connect with patients for Virtual Visits (Telemedicine).  Patients are able to view lab/test results, encounter notes, upcoming appointments, etc.  Non-urgent messages can be sent to your provider as well.   To learn more about what you can do with MyChart, go to [email or web address].    Your next appointment:   3 month(s)  The format for your next appointment:   In Person  Provider:   Pixie Casino, MD     Other Instructions None     Signed, [name], [place]  06/23/2021 11:21 PM    [place]

## 2021-06-21 NOTE — Patient Instructions (Signed)
Medication Instructions:  Your physician recommends that you continue on your current medications as directed. Please refer to the Current Medication list given to you today.   *If you need a refill on your cardiac medications before your next appointment, please call your pharmacy*   Lab Work: Your physician recommends that you complete lab work today BMET  If you have labs (blood work) drawn today and your tests are completely normal, you will receive your results only by: MyChart Message (if you have MyChart) OR A paper copy in the mail If you have any lab test that is abnormal or we need to change your treatment, we will call you to review the results.   Testing/Procedures: NONE ordered at this time of appointment     Follow-Up: At [facility], you and your health needs are our priority.  As part of our continuing mission to provide you with exceptional heart care, we have created designated Provider Care Teams.  These Care Teams include your primary Cardiologist (physician) and Advanced Practice Providers (APPs -  Physician Assistants and Nurse Practitioners) who all work together to provide you with the care you need, when you need it.  We recommend signing up for the patient portal called "MyChart".  Sign up information is provided on this After Visit Summary.  MyChart is used to connect with patients for Virtual Visits (Telemedicine).  Patients are able to view lab/test results, encounter notes, upcoming appointments, etc.  Non-urgent messages can be sent to your provider as well.   To learn more about what you can do with MyChart, go to [email or web address].    Your next appointment:   3 month(s)  The format for your next appointment:   In Person  Provider:   Pixie Casino, MD     Other Instructions None

## 2021-06-23 ENCOUNTER — Encounter: Payer: Self-pay | Admitting: Physician Assistant

## 2021-06-23 DIAGNOSIS — R311 Benign essential microscopic hematuria: Secondary | ICD-10-CM | POA: Diagnosis not present

## 2021-06-23 DIAGNOSIS — R3915 Urgency of urination: Secondary | ICD-10-CM | POA: Diagnosis not present

## 2021-06-30 ENCOUNTER — Telehealth: Payer: Self-pay

## 2021-06-30 NOTE — Telephone Encounter (Signed)
He should follow the dosage change per cardiology.  [name]. Jerline Pain, MD 06/30/2021 1:06 PM

## 2021-06-30 NOTE — Telephone Encounter (Signed)
Left message to return call to our office at their convenience.  

## 2021-06-30 NOTE — Telephone Encounter (Signed)
Patient return call  Advise to follow dosage changes per Cardiolog Pt verbalized understanding

## 2021-06-30 NOTE — Telephone Encounter (Signed)
Please advise 

## 2021-06-30 NOTE — Telephone Encounter (Signed)
[name] states Dr. Jerline Pain had prescribed him furosemide.  States Cardiology has changed his dosage.   [name] wants a call back to discuss which dosage he should follow?

## 2021-08-14 ENCOUNTER — Telehealth: Payer: Self-pay | Admitting: Internal Medicine

## 2021-08-14 NOTE — Telephone Encounter (Signed)
Spoke with pt regarding sharp pain that he feels around his heart. Pt states that it started yesterday but went away with tylenol. Pt describes the pain as sharp when it happens and gets better when he lays down. Pt states that it is not hurting right now but was earlier before he took his morning medication. B/P this morning is 139/74  and heart rate 46bpm. Pt states that he can not recall if anything makes the pain come on.  Pt states that he has taken all of his medication as prescribed and has not missed any doses. Pt states that he would like to be seen in the office. Able to schedule pt for DOD on Thurs. Pt given ED precautions. Pt verbalizes understanding.  ?

## 2021-08-14 NOTE — Telephone Encounter (Signed)
Pt c/o of Chest Pain: STAT if CP now or developed within 24 hours ? ?1. Are you having CP right now? Not at this exact time ? ?2. Are you experiencing any other symptoms (ex. SOB, nausea, vomiting, sweating)? no ? ?3. How long have you been experiencing CP? Since yesterday ? ?4. Is your CP continuous or coming and going? Comes and goes- come very often ? ?5. Have you taken Nitroglycerin? no ?? ? ?

## 2021-08-17 ENCOUNTER — Encounter: Payer: Self-pay | Admitting: Cardiology

## 2021-08-17 ENCOUNTER — Ambulatory Visit (INDEPENDENT_AMBULATORY_CARE_PROVIDER_SITE_OTHER): Payer: Medicare Other | Admitting: Cardiology

## 2021-08-17 ENCOUNTER — Other Ambulatory Visit: Payer: Self-pay

## 2021-08-17 VITALS — BP 116/66 | HR 77 | Ht 72.0 in | Wt 210.8 lb

## 2021-08-17 DIAGNOSIS — I480 Paroxysmal atrial fibrillation: Secondary | ICD-10-CM | POA: Insufficient documentation

## 2021-08-17 DIAGNOSIS — I5022 Chronic systolic (congestive) heart failure: Secondary | ICD-10-CM

## 2021-08-17 DIAGNOSIS — I2510 Atherosclerotic heart disease of native coronary artery without angina pectoris: Secondary | ICD-10-CM | POA: Insufficient documentation

## 2021-08-17 NOTE — Progress Notes (Signed)
?Cardiology Office Note:   ? ?Date:  08/17/2021  ? ?ID:  [name], DOB 22-Oct-1940, MRN [id number] ? ?PCP:  Vivi Barrack, MD  ?Cardiologist:  Pixie Casino, MD  ?Electrophysiologist:  None  ? ?Referring MD: Vivi Barrack, MD  ? ?" I am doing fine now" ? ?History of Present Illness:   ? ?[name] is a 81 y.o. male with a hx of coronary artery disease with heart catheterization back in 2020 showing nonobstructive coronary artery disease, hypertension, hyperlipidemia, paroxysmal atrial fibrillation on Eliquis. ? ?The patient follows with Dr. Debara Pickett usually.  He is here today as a DOD visit.  He called and reported that over the weekend he had some chest discomfort.  Today in the office he tells me that he is feeling a lot better and he almost canceled his visit.  Put asked the patient that I needed to understand the nature of his chest pain on Sunday.  He reluctantly describes this to me as a midsternal chest discomfort which he said was a sharp sensation.  He said it was short-lived.  He said that it had not happened since.  But he wanted the office to know.  He denied any shortness of breath or any palpitations at that time.  He denies any radiation of the pain. ? ?No other complaints at this time. ? ?Past Medical History:  ?Diagnosis Date  ? CAD (coronary artery disease)   ? a. Remote cath 2008 for chest pain showed 30% LAD with luminal irregularity and fairly heavy calcification in the mid LAD without critical stenosis, 30% OM1, 30% acute marginal.  ? Cancer ([place])   ? skin cancer - basal  ? Cataract   ? both  ? Chronic combined systolic and diastolic CHF (congestive heart failure) ([place])   ? Complication of anesthesia   ? Diabetes mellitus   ? Type II  ? ED (erectile dysfunction)   ? History of kidney stones   ? x1  ? Hypertension   ? NICM (nonischemic cardiomyopathy) ([place])   ? a. EF 40-45% by echo in 08/2017 - prior low risk stress test in 2017.  ? Osteoarthritis   ? Paroxysmal SVT (supraventricular  tachycardia) ([facility])   ? PONV (postoperative nausea and vomiting) 1960  ? with Ether  ? PVC's (premature ventricular contractions)   ? PVD (peripheral vascular disease) ([place])   ? s/p R BKA  ? Rheumatic fever   ? Rhinitis   ? S/P BKA (below knee amputation), right ([place])   ? ? ?Past Surgical History:  ?Procedure Laterality Date  ? AMPUTATION Right 02/29/2016  ? Procedure: Right Leg AMPUTATION BELOW KNEE with Wound Vac placement;  Surgeon: [name], MD;  Location: [facility];  Service: Orthopedics;  Laterality: Right;  ? CARDIOVERSION N/A 06/08/2021  ? Procedure: CARDIOVERSION;  Surgeon: Jerline Pain, MD;  Location: [facility] ENDOSCOPY;  Service: Cardiovascular;  Laterality: N/A;  ? CHOLECYSTECTOMY  1983  ? colonoscopy  2006, 2010  ? EYE SURGERY Bilateral   ? cataract  ? INGUINAL HERNIA REPAIR Right 1960  ? LEFT HEART CATH AND CORONARY ANGIOGRAPHY N/A 06/03/2018  ? Procedure: LEFT HEART CATH AND CORONARY ANGIOGRAPHY;  Surgeon: Belva Crome, MD;  Location: [place];  Service: Cardiovascular;  Laterality: N/A;  ? STUMP REVISION Right 10/30/2017  ? Procedure: RIGHT BELOW KNEE AMPUTATION REVISION;  Surgeon: [name], MD;  Location: Home;  Service: Orthopedics;  Laterality: Right;  ? ? ?Current  Medications: ?Current Meds  ?Medication Sig  ? ACCU-CHEK AVIVA PLUS test strip USE TO TEST GLUCOSE ONCE DAILY E11.9  ? Accu-Chek FastClix Lancets MISC USE TO CHECK BLOOD SUGAR DAILY AND AS NEEDED  ? acetaminophen (TYLENOL) 500 MG tablet Take 500-1,000 mg by mouth every 6 (six) hours as needed (pain).  ? apixaban (ELIQUIS) 5 MG TABS tablet Take 1 tablet (5 mg total) by mouth 2 (two) times daily.  ? carvedilol (COREG) 25 MG tablet TAKE 1 TABLET BY MOUTH TWICE A DAY (Patient taking differently: Take 25 mg by mouth in the morning and at bedtime.)  ? doxycycline (VIBRA-TABS) 100 MG tablet TAKE 1 TABLET BY MOUTH TWICE A DAY  ? furosemide (LASIX) 20 MG tablet Take 2 tablets (40 mg total) by mouth daily. (Patient taking differently:  Take 20 mg by mouth 2 (two) times daily.)  ? gabapentin (NEURONTIN) 100 MG capsule TAKE 1 CAPSULE BY MOUTH THREE TIMES A DAY (Patient taking differently: Take 100 mg by mouth 2 (two) times daily.)  ? halobetasol (ULTRAVATE) 0.05 % cream APPLY TO AFFECTED AREA TWICE A DAY (Patient taking differently: Apply 1 application. topically 2 (two) times daily as needed (skin irritation).)  ? metFORMIN (GLUCOPHAGE) 1000 MG tablet TAKE 1 TABLET BY MOUTH EVERY DAY WITH BREAKFAST (Patient taking differently: Take 1,000 mg by mouth daily.)  ? nystatin cream (MYCOSTATIN) APPLY TO AFFECTED AREA TWICE A DAY (Patient taking differently: Apply 1 application. topically 2 (two) times daily as needed (skin irritation).)  ? pantoprazole (PROTONIX) 40 MG tablet TAKE 1 TABLET BY MOUTH EVERY DAY  ? potassium chloride SA (KLOR-CON M20) 20 MEQ tablet TAKE 2 TABLETS BY MOUTH EVERY MORNING  ? sacubitril-valsartan (ENTRESTO) 49-51 MG Take 1 tablet by mouth 2 (two) times daily.  ? sildenafil (VIAGRA) 100 MG tablet TAKE 1 TABLET DAILY AS NEEDED ED (Patient taking differently: Take 100 mg by mouth daily as needed for erectile dysfunction.)  ? solifenacin (VESICARE) 10 MG tablet Take 10 mg by mouth daily.  ? tamsulosin (FLOMAX) 0.4 MG CAPS capsule TAKE 1 CAPSULE BY MOUTH EVERY DAY (Patient taking differently: Take 0.4 mg by mouth daily after breakfast.)  ?  ? ?Allergies:   Lisinopril  ? ?Social History  ? ?Socioeconomic History  ? Marital status: Married  ?  Spouse name: Not on file  ? Number of children: 1  ? Years of education: 16+  ? Highest education level: Not on file  ?Occupational History  ? Occupation: Curator - [employer]  ?  Employer: RETIRED  ?Tobacco Use  ? Smoking status: Former  ?  Years: 20.00  ?  Types: Cigarettes  ?  Quit date: 07/26/1981  ?  Years since quitting: 40.0  ? Smokeless tobacco: Never  ?Vaping Use  ? Vaping Use: Never used  ?Substance and Sexual Activity  ? Alcohol use: Not Currently  ?  Comment:  occasional glass of wine  ? Drug use: No  ? Sexual activity: Not Currently  ?Other Topics Concern  ? Not on file  ?Social History Narrative  ? Lives with wife.  ? epworth sleepiness scale = 5 (01/20/16)  ? ?Social Determinants of Health  ? ?Financial Resource Strain: Medium Risk  ? Difficulty of Paying Living Expenses: Somewhat hard  ?Food Insecurity: No Food Insecurity  ? Worried About Charity fundraiser in the Last Year: Never true  ? Ran Out of Food in the Last Year: Never true  ?Transportation Needs: No Transportation Needs  ? Lack  of Transportation (Medical): No  ? Lack of Transportation (Non-Medical): No  ?Physical Activity: Not on file  ?Stress: Not on file  ?Social Connections: Not on file  ?  ? ?Family History: ?The patient's family history includes Coronary artery disease (age of onset: 62) in his brother; Stroke (age of onset: 62) in his father. There is no history of Colon cancer, Colon polyps, Diabetes, Esophageal cancer, Kidney disease, or Gallbladder disease. ? ?ROS:   ?Review of Systems  ?Constitution: Negative for decreased appetite, fever and weight gain.  ?HENT: Negative for congestion, ear discharge, hoarse voice and sore throat.   ?Eyes: Negative for discharge, redness, vision loss in right eye and visual halos.  ?Cardiovascular: Reports chest pain.  Negative for, dyspnea on exertion, leg swelling, orthopnea and palpitations.  ?Respiratory: Negative for cough, hemoptysis, shortness of breath and snoring.   ?Endocrine: Negative for heat intolerance and polyphagia.  ?Hematologic/Lymphatic: Negative for bleeding problem. Does not bruise/bleed easily.  ?Skin: Negative for flushing, nail changes, rash and suspicious lesions.  ?Musculoskeletal: Negative for arthritis, joint pain, muscle cramps, myalgias, neck pain and stiffness.  ?Gastrointestinal: Negative for abdominal pain, bowel incontinence, diarrhea and excessive appetite.  ?Genitourinary: Negative for decreased libido, genital sores and  incomplete emptying.  ?Neurological: Negative for brief paralysis, focal weakness, headaches and loss of balance.  ?Psychiatric/Behavioral: Negative for altered mental status, depression and suicidal ideas.  ?Allergic/Imm

## 2021-08-17 NOTE — Patient Instructions (Signed)
Medication Instructions:  ?Your physician recommends that you continue on your current medications as directed. Please refer to the Current Medication list given to you today.  ?*If you need a refill on your cardiac medications before your next appointment, please call your pharmacy* ? ? ?Lab Work: ?None ?If you have labs (blood work) drawn today and your tests are completely normal, you will receive your results only by: ?MyChart Message (if you have MyChart) OR ?A paper copy in the mail ?If you have any lab test that is abnormal or we need to change your treatment, we will call you to review the results. ? ? ?Testing/Procedures: ?None ? ? ?Follow-Up: ?At [facility], you and your health needs are our priority.  As part of our continuing mission to provide you with exceptional heart care, we have created designated Provider Care Teams.  These Care Teams include your primary Cardiologist (physician) and Advanced Practice Providers (APPs -  Physician Assistants and Nurse Practitioners) who all work together to provide you with the care you need, when you need it. ? ?We recommend signing up for the patient portal called "MyChart".  Sign up information is provided on this After Visit Summary.  MyChart is used to connect with patients for Virtual Visits (Telemedicine).  Patients are able to view lab/test results, encounter notes, upcoming appointments, etc.  Non-urgent messages can be sent to your provider as well.   ?To learn more about what you can do with MyChart, go to [email or web address].   ? ?Your next appointment:   ?3-4 month(s) ? ?The format for your next appointment:   ?In Person ? ?Provider:   ?Pixie Casino, MD   ? ? ?Other Instructions ?  ?

## 2021-08-21 ENCOUNTER — Other Ambulatory Visit: Payer: Self-pay | Admitting: Family Medicine

## 2021-08-28 ENCOUNTER — Other Ambulatory Visit (HOSPITAL_COMMUNITY): Payer: Self-pay | Admitting: Cardiology

## 2021-08-28 ENCOUNTER — Other Ambulatory Visit: Payer: Self-pay | Admitting: Family Medicine

## 2021-09-13 DIAGNOSIS — Z85828 Personal history of other malignant neoplasm of skin: Secondary | ICD-10-CM | POA: Diagnosis not present

## 2021-09-13 DIAGNOSIS — L570 Actinic keratosis: Secondary | ICD-10-CM | POA: Diagnosis not present

## 2021-10-02 ENCOUNTER — Ambulatory Visit (INDEPENDENT_AMBULATORY_CARE_PROVIDER_SITE_OTHER): Payer: Medicare Other | Admitting: Family Medicine

## 2021-10-02 VITALS — BP 101/62 | HR 66 | Temp 98.4°F | Ht 72.0 in | Wt 208.6 lb

## 2021-10-02 DIAGNOSIS — Z23 Encounter for immunization: Secondary | ICD-10-CM | POA: Diagnosis not present

## 2021-10-02 DIAGNOSIS — E1159 Type 2 diabetes mellitus with other circulatory complications: Secondary | ICD-10-CM

## 2021-10-02 DIAGNOSIS — E11621 Type 2 diabetes mellitus with foot ulcer: Secondary | ICD-10-CM | POA: Diagnosis not present

## 2021-10-02 DIAGNOSIS — L97421 Non-pressure chronic ulcer of left heel and midfoot limited to breakdown of skin: Secondary | ICD-10-CM

## 2021-10-02 DIAGNOSIS — E1149 Type 2 diabetes mellitus with other diabetic neurological complication: Secondary | ICD-10-CM

## 2021-10-02 DIAGNOSIS — D6869 Other thrombophilia: Secondary | ICD-10-CM | POA: Diagnosis not present

## 2021-10-02 DIAGNOSIS — I152 Hypertension secondary to endocrine disorders: Secondary | ICD-10-CM | POA: Diagnosis not present

## 2021-10-02 NOTE — Patient Instructions (Signed)
It was very nice to see you today! ? ?You probably have a little arthritis in your neck.  Please let me know if your symptoms come back and we can have you see a physical therapist. ? ?Please call the foot doctor to schedule an appointment soon at [phone]. ? ?We will give your pneumonia vaccine today. ? ?Please come back to see me in about 6 months or so.  Please come back to see me sooner if needed. ? ?Take care, ?Dr Jerline Pain ? ?PLEASE NOTE: ? ?If you had any lab tests please let us know if you have not heard back within a few days. You may see your results on mychart before we have a chance to review them but we will give you a call once they are reviewed by [place]. If we ordered any referrals today, please let us know if you have not heard from their office within the next week.  ? ?Please try these tips to maintain a healthy lifestyle: ? ?Eat at least 3 REAL meals and 1-2 snacks per day.  Aim for no more than 5 hours between eating.  If you eat breakfast, please do so within one hour of getting up.  ? ?Each meal should contain half fruits/vegetables, one quarter protein, and one quarter carbs (no bigger than a computer mouse) ? ?Cut down on sweet beverages. This includes juice, soda, and sweet tea.  ? ?Drink at least 1 glass of water with each meal and aim for at least 8 glasses per day ? ?Exercise at least 150 minutes every week.   ?

## 2021-10-02 NOTE — Progress Notes (Signed)
? ?  [name] is a 81 y.o. male who presents today for an office visit. ? ?Assessment/Plan:  ?New/Acute Problems: ?Neck Pain ?No red flags. Symptoms have resolved at this point. Probably has underlying osteoarthritis. We discussed imaging and referral to PT however given that symptoms have resolved, we will defer for now.  We will continue with watchful waiting.  He will let me know if symptoms return. ? ?Chronic Problems Addressed Today: ?Hypertension associated with diabetes ([place]) ?Blood pressure is at goal today on Entresto 24-26 twice daily and Coreg 25 mg twice daily. ? ?Diabetic foot ulcer ([place]) ?No signs of infection though he is at increased risk due to prior history of right BKA.  His sugars have been well controlled.  He will follow-up with podiatry soon for ongoing management.  Gave contact information today. ? ?Prevnar 20 given toyda.  ? ?  ?Subjective:  ?HPI: ? ?Patient here with neck pain for the last week or so. He has been feeling a popping sensation in his neck for the last week. Symptoms resolved this morning. Popping would be worse when looking up. No obvious trauma or other precipitating events.  No weakness or numbness.  No treatments tried. ? ?See A/p for status of chronic conditions.  ? ?   ?  ?Objective:  ?Physical Exam: ?BP 101/62 (BP Location: Left Arm)   Pulse 66   Temp 98.4 ?F (36.9 ?C) (Temporal)   Ht 6' (1.829 m)   Wt 208 lb 9.6 oz (94.6 kg)   SpO2 95%   BMI 28.29 kg/m?   ?Gen: No acute distress, resting comfortably ?CV: Regular rate and rhythm with no murmurs appreciated ?Pulm: Normal work of breathing, clear to auscultation bilaterally with no crackles, wheezes, or rhonchi ?MSK:  ?- Neck: Full range of motion throughout.  No popping or clicking appreciated.  Neurovascularly intact distally in arms and extremities with normal strength and sensation. ?- Left foot: Approximately 1.5 cm open ulcer on plantar aspect of left foot.  Healthy granulation tissue present.  No  surrounding erythema.  No exudate. ?Neuro: Grossly normal, moves all extremities ?Psych: Normal affect and thought content ? ?   ? ?Algis Greenhouse. Jerline Pain, MD ?10/02/2021 11:21 AM  ?

## 2021-10-02 NOTE — Assessment & Plan Note (Signed)
No signs of infection though he is at increased risk due to prior history of right BKA.  His sugars have been well controlled.  He will follow-up with podiatry soon for ongoing management.  Gave contact information today. ?

## 2021-10-02 NOTE — Assessment & Plan Note (Signed)
Blood pressure is at goal today on Entresto 24-26 twice daily and Coreg 25 mg twice daily. ?

## 2021-10-12 ENCOUNTER — Ambulatory Visit (INDEPENDENT_AMBULATORY_CARE_PROVIDER_SITE_OTHER): Payer: Medicare Other

## 2021-10-12 ENCOUNTER — Ambulatory Visit (INDEPENDENT_AMBULATORY_CARE_PROVIDER_SITE_OTHER): Payer: Medicare Other | Admitting: Podiatry

## 2021-10-12 DIAGNOSIS — E08621 Diabetes mellitus due to underlying condition with foot ulcer: Secondary | ICD-10-CM

## 2021-10-12 DIAGNOSIS — M21962 Unspecified acquired deformity of left lower leg: Secondary | ICD-10-CM | POA: Diagnosis not present

## 2021-10-12 DIAGNOSIS — L97421 Non-pressure chronic ulcer of left heel and midfoot limited to breakdown of skin: Secondary | ICD-10-CM | POA: Diagnosis not present

## 2021-10-12 DIAGNOSIS — L97422 Non-pressure chronic ulcer of left heel and midfoot with fat layer exposed: Secondary | ICD-10-CM | POA: Diagnosis not present

## 2021-10-12 DIAGNOSIS — E1149 Type 2 diabetes mellitus with other diabetic neurological complication: Secondary | ICD-10-CM

## 2021-10-12 MED ORDER — MUPIROCIN 2 % EX OINT: 1.0000 "application " | TOPICAL_OINTMENT | Freq: Every day | CUTANEOUS | 2 refills | Status: DC

## 2021-10-12 MED ORDER — CEPHALEXIN 500 MG PO CAPS: 500.0000 mg | ORAL_CAPSULE | Freq: Three times a day (TID) | ORAL | 0 refills | Status: DC

## 2021-10-12 NOTE — Patient Instructions (Addendum)
Call [facility] to schedule your MRI at the below locations.  Please allow at least 1 business day after your visit to process the referral.  It may take longer depending on approval from insurance.  Please let me know if you have issues or problems scheduling the MRI  [facility] [phone] [address], [place] [address]    Change your dressing every day with the mupirocin ointment and a band aid   Take the cephalexin antibiotic 3x a day   We will order you a boot to send to your house to walk in to protect the ulcer

## 2021-10-13 DIAGNOSIS — E08621 Diabetes mellitus due to underlying condition with foot ulcer: Secondary | ICD-10-CM | POA: Diagnosis not present

## 2021-10-13 DIAGNOSIS — L97422 Non-pressure chronic ulcer of left heel and midfoot with fat layer exposed: Secondary | ICD-10-CM | POA: Diagnosis not present

## 2021-10-14 LAB — CBC WITH DIFFERENTIAL/PLATELET
Basophils Absolute: 0.1 x10E3/uL (ref 0.0–0.2)
Basos: 2 %
EOS (ABSOLUTE): 0.2 x10E3/uL (ref 0.0–0.4)
Eos: 3 %
Hematocrit: 37.7 % (ref 37.5–51.0)
Hemoglobin: 13 g/dL (ref 13.0–17.7)
Immature Grans (Abs): 0 x10E3/uL (ref 0.0–0.1)
Immature Granulocytes: 0 %
Lymphocytes Absolute: 1.2 x10E3/uL (ref 0.7–3.1)
Lymphs: 21 %
MCH: 29.5 pg (ref 26.6–33.0)
MCHC: 34.5 g/dL (ref 31.5–35.7)
MCV: 86 fL (ref 79–97)
Monocytes Absolute: 0.4 x10E3/uL (ref 0.1–0.9)
Monocytes: 7 %
Neutrophils Absolute: 3.9 x10E3/uL (ref 1.4–7.0)
Neutrophils: 67 %
Platelets: 225 x10E3/uL (ref 150–450)
RBC: 4.4 x10E6/uL (ref 4.14–5.80)
RDW: 11.9 % (ref 11.6–15.4)
WBC: 5.8 x10E3/uL (ref 3.4–10.8)

## 2021-10-14 LAB — SEDIMENTATION RATE: Sed Rate: 17 mm/h (ref 0–30)

## 2021-10-16 ENCOUNTER — Ambulatory Visit
Admission: RE | Admit: 2021-10-16 | Discharge: 2021-10-16 | Disposition: A | Discharge status: Home / Self Care | Payer: Medicare Other | Source: Ambulatory Visit | Attending: Podiatry | Admitting: Podiatry

## 2021-10-16 ENCOUNTER — Encounter: Payer: Self-pay | Admitting: Podiatry

## 2021-10-16 DIAGNOSIS — R600 Localized edema: Secondary | ICD-10-CM | POA: Diagnosis not present

## 2021-10-16 DIAGNOSIS — E11621 Type 2 diabetes mellitus with foot ulcer: Secondary | ICD-10-CM | POA: Diagnosis not present

## 2021-10-16 DIAGNOSIS — L97422 Non-pressure chronic ulcer of left heel and midfoot with fat layer exposed: Secondary | ICD-10-CM

## 2021-10-16 DIAGNOSIS — M19072 Primary osteoarthritis, left ankle and foot: Secondary | ICD-10-CM | POA: Diagnosis not present

## 2021-10-16 NOTE — Progress Notes (Signed)
  Subjective:  Patient ID: [name], male    DOB: [birth year],  MRN: [id number]  Chief Complaint  Patient presents with   Foot Problem    L foot has a sore on it    81 y.o. male presents with the above complaint. History confirmed with patient.  He says the wound started in January 2023 its mostly been in the arch.  Objective:  Physical Exam: warm, good capillary refill and normal DP and PT pulses.  Diffuse polyneuropathy with loss of protective sensation Left Foot: There is a large full-thickness ulcer on the plantar medial arch measuring approximately 4.0 x 4.0 cm with surrounding hyperkeratosis and a granular wound bed no exposed bone tendon or joint there is serous drainage   Radiographs: Multiple views x-ray of the left foot: no soft tissue emphysema, no signs of osteomyelitis, he has collapse of the midfoot with osteoarthritis Assessment:   1. Diabetic ulcer of left midfoot associated with diabetes mellitus due to underlying condition, with fat layer exposed ([place])   2. Type II diabetes mellitus with neurological manifestations ([place])   3. Acquired deformity of left foot      Plan:  Patient was evaluated and treated and all questions answered.  Ulcer left foot -We discussed the etiology and factors that are a part of the wound healing process.  We also discussed the risk of infection both soft tissue and osteomyelitis from open ulceration.  Discussed the risk of limb loss if this happens or worsens. -Debridement as below. -Dressed with Iodosorb, DSD. -Continue home dressing changes daily with 4 x 4 gauze and mupirocin ointment -Continue off-loading with defender boot this was ordered for him. -Vascular testing not indicated currently he has palpable pulses we will consider this in the future if not improving -HgbA1c: 5.9% in 05/11/2021 -Last antibiotics: Rx for Keflex sent to pharmacy -Lab work including sed rate and CBC was ordered -Imaging: x-ray reviewed, shows no  signs of erosions, osteolysis, osteomyelitis or emphysema.  Due to the location and chronicity I do recommend an MRI to evaluate for underlying osteomyelitis  Procedure: Excisional Debridement of Wound Rationale: Removal of non-viable soft tissue from the wound to promote healing.  Anesthesia: none Post-Debridement Wound Measurements: 4.0 cm x 4.0 cm x 1.0 cm  Type of Debridement: Sharp Excisional Tissue Removed: Non-viable soft tissue Depth of Debridement: subcutaneous tissue. Technique: Sharp excisional debridement to bleeding, viable wound base.  Dressing: Dry, sterile, compression dressing. Disposition: Patient tolerated procedure well.         Return in about 2 weeks (around 10/26/2021) for wound care, after lab work to review, after MRI to review.

## 2021-10-23 ENCOUNTER — Other Ambulatory Visit: Payer: Self-pay | Admitting: Family Medicine

## 2021-10-26 ENCOUNTER — Ambulatory Visit (INDEPENDENT_AMBULATORY_CARE_PROVIDER_SITE_OTHER): Payer: Medicare Other | Admitting: Podiatry

## 2021-10-26 DIAGNOSIS — E1149 Type 2 diabetes mellitus with other diabetic neurological complication: Secondary | ICD-10-CM

## 2021-10-26 DIAGNOSIS — L97422 Non-pressure chronic ulcer of left heel and midfoot with fat layer exposed: Secondary | ICD-10-CM

## 2021-10-26 DIAGNOSIS — E08621 Diabetes mellitus due to underlying condition with foot ulcer: Secondary | ICD-10-CM

## 2021-10-26 DIAGNOSIS — M21962 Unspecified acquired deformity of left lower leg: Secondary | ICD-10-CM

## 2021-10-26 NOTE — Progress Notes (Signed)
Subjective:  Patient ID: [name], male    DOB: Oct 06, 1940,  MRN: [id number]  Chief Complaint  Patient presents with   wound care    Patient is here for wound care left foot.    81 y.o. male presents with the above complaint. History confirmed with patient.  Feels like he is doing better after taking the antibiotics.  Did not have any adverse effects.  Completed the MRI and lab work.  Objective:  Physical Exam: warm, good capillary refill and normal DP and PT pulses.  Diffuse polyneuropathy with loss of protective sensation Left Foot: There is a large full-thickness ulcer on the plantar medial arch measuring approximately 3.0 x 1.6 x 0.5 cm with surrounding hyperkeratosis and a granular wound bed no exposed bone tendon or joint there is little to no serous drainage     Radiographs: Multiple views x-ray of the left foot: no soft tissue emphysema, no signs of osteomyelitis, he has collapse of the midfoot with osteoarthritis    IMPRESSION: 1. No evidence of osteomyelitis of the left foot. 2. Severe soft tissue edema along the dorsal aspect of the foot concerning for cellulitis. 3. Small fluid collection measuring 12 mm along the plantar aspect of the first TMT joint concerning for an adventitial bursa or ganglion cyst. 4. Severe osteoarthritis of the first TMT joint. 5. Moderate osteoarthritis of the second tarsometatarsal joint. 6. Mild osteoarthritis of the third and fourth tarsometatarsal joint. Moderate osteoarthritis of the navicular-cuneiform joint. Severe degenerative changes of the intermetatarsal joint of the first and second metatarsal bases.     Electronically Signed   By: Kathreen Devoid M.D.    CBC and sed rate in normal limits    Assessment:   1. Diabetic ulcer of left midfoot associated with diabetes mellitus due to underlying condition, with fat layer exposed ([place])   2. Type II diabetes mellitus with neurological manifestations ([place])   3. Acquired  deformity of left foot      Plan:  Patient was evaluated and treated and all questions answered.  Ulcer left foot -We discussed the etiology and factors that are a part of the wound healing process.  We also discussed the risk of infection both soft tissue and osteomyelitis from open ulceration.  Discussed the risk of limb loss if this happens or worsens. -Debridement as below. -Dressed with Prisma, DSD. -Continue home dressing changes daily with 4 x 4 gauze and Prisma silver collagen -Has not received defender offloading boot yet I expect he will hopefully get this soon we will reach out to the company to confirm this.  If not we will transition back to surgical shoe and a peg assist.  The boot will be necessary to the amount of severe deformity that he has in the foot. -Vascular testing not indicated currently he has palpable pulses we will consider this in the future if not improving -HgbA1c: 5.9% in 05/11/2021 -Last antibiotics: No indication for new antibiotics -Reviewed lab work with him no signs of infection -MRI was negative for osteomyelitis which I discussed with him  Procedure: Excisional Debridement of Wound Rationale: Removal of non-viable soft tissue from the wound to promote healing.  Anesthesia: none Post-Debridement Wound Measurements: 3.0 x 1.6 x 0.5 type of Debridement: Sharp Excisional Tissue Removed: Non-viable soft tissue Depth of Debridement: subcutaneous tissue. Technique: Sharp excisional debridement to bleeding, viable wound base.  Dressing: Dry, sterile, compression dressing. Disposition: Patient tolerated procedure well.         No  follow-ups on file.

## 2021-10-27 ENCOUNTER — Other Ambulatory Visit: Payer: Self-pay | Admitting: Family Medicine

## 2021-10-27 ENCOUNTER — Other Ambulatory Visit: Payer: Self-pay | Admitting: *Deleted

## 2021-10-27 MED ORDER — METFORMIN HCL 1000 MG PO TABS: ORAL_TABLET | ORAL | 1 refills | Status: DC

## 2021-10-27 MED ORDER — SOLIFENACIN SUCCINATE 10 MG PO TABS: 10.0000 mg | ORAL_TABLET | Freq: Every day | ORAL | 2 refills | Status: DC

## 2021-10-27 MED ORDER — SILDENAFIL CITRATE 100 MG PO TABS: ORAL_TABLET | ORAL | 22 refills | Status: DC

## 2021-10-27 MED ORDER — PANTOPRAZOLE SODIUM 40 MG PO TBEC: 40.0000 mg | DELAYED_RELEASE_TABLET | Freq: Every day | ORAL | 1 refills | Status: DC

## 2021-10-27 MED ORDER — POTASSIUM CHLORIDE CRYS ER 20 MEQ PO TBCR: EXTENDED_RELEASE_TABLET | ORAL | 3 refills | Status: DC

## 2021-10-27 NOTE — Telephone Encounter (Signed)
Patient is requesting script for potassium, gabapentin, metformin, pantoprazole, viagra and solifenacin  to be sent to [facility] at [place].  States he is transferring scripts.  Please remove [facility] as pharmacy.

## 2021-10-27 NOTE — Telephone Encounter (Signed)
Rx send to [facility]

## 2021-10-30 MED ORDER — GABAPENTIN 100 MG PO CAPS
ORAL_CAPSULE | ORAL | 3 refills | Status: DC
Start: 2021-10-30 — End: 2021-11-14

## 2021-11-03 ENCOUNTER — Telehealth: Payer: Self-pay | Admitting: Internal Medicine

## 2021-11-03 ENCOUNTER — Telehealth: Payer: Self-pay | Admitting: Pharmacist

## 2021-11-03 MED ORDER — CARVEDILOL 25 MG PO TABS: 25.0000 mg | ORAL_TABLET | Freq: Two times a day (BID) | ORAL | 3 refills | Status: DC

## 2021-11-03 NOTE — Telephone Encounter (Signed)
*  STAT* If patient is at the pharmacy, call can be transferred to refill team.   1. Which medications need to be refilled? (please list name of each medication and dose if known)   carvedilol (COREG) 25 MG tablet    2. Which pharmacy/location (including street and city if local pharmacy) is medication to be sent to?   3. Do they need a 30 day or 90 day supply?  90 day

## 2021-11-03 NOTE — Progress Notes (Signed)
Chronic Care Management Pharmacy Assistant   Name: [name]  MRN: [id number] DOB: [birth year]   Reason for Encounter: Hypertension Adherence Call    Recent office visits:  10/02/2021 OV (PCP) Vivi Barrack, MD; no medication changes indicated.  Recent consult visits:  10/26/2021 OV (Podiatry) Criselda Peaches, DPM; no medication changes indicated.  10/12/2021 OV (Podiatry) Criselda Peaches, DPM; Rx for Keflex sent to pharmacy  08/17/2021 OV (Cardiology) Berniece Salines, DO; no medication changes indicated.  06/21/2021 OV (Cardiology) [name], [place]; no medication changes indicated.  Hospital visits:  06/08/2021 Admission for Cardioversion  Medications: Outpatient Encounter Medications as of 11/03/2021  Medication Sig   ACCU-CHEK AVIVA PLUS test strip USE TO TEST GLUCOSE ONCE DAILY E11.9   Accu-Chek FastClix Lancets MISC USE TO CHECK BLOOD SUGAR DAILY AND AS NEEDED   acetaminophen (TYLENOL) 500 MG tablet Take 500-1,000 mg by mouth every 6 (six) hours as needed (pain).   apixaban (ELIQUIS) 5 MG TABS tablet Take 1 tablet (5 mg total) by mouth 2 (two) times daily.   carvedilol (COREG) 25 MG tablet Take 1 tablet (25 mg total) by mouth 2 (two) times daily.   cephALEXin (KEFLEX) 500 MG capsule Take 1 capsule (500 mg total) by mouth 3 (three) times daily.   furosemide (LASIX) 20 MG tablet TAKE 2 TABLETS (40 MG TOTAL) BY MOUTH DAILY.   gabapentin (NEURONTIN) 100 MG capsule TAKE 1 CAPSULE BY MOUTH THREE TIMES A DAY Strength: 100 mg   halobetasol (ULTRAVATE) 0.05 % cream APPLY TO AFFECTED AREA TWICE A DAY (Patient taking differently: Apply 1 application. topically 2 (two) times daily as needed (skin irritation).)   metFORMIN (GLUCOPHAGE) 1000 MG tablet TAKE 1 TABLET BY MOUTH EVERY DAY WITH BREAKFAST   mupirocin ointment (BACTROBAN) 2 % Apply 1 application. topically daily.   nystatin cream (MYCOSTATIN) APPLY TO AFFECTED AREA TWICE A DAY (Patient taking differently: Apply 1 application.  topically 2 (two) times daily as needed (skin irritation).)   pantoprazole (PROTONIX) 40 MG tablet Take 1 tablet (40 mg total) by mouth daily.   potassium chloride SA (KLOR-CON M20) 20 MEQ tablet TAKE 2 TABLETS BY MOUTH EVERY MORNING   sacubitril-valsartan (ENTRESTO) 49-51 MG Take 1 tablet by mouth 2 (two) times daily.   sildenafil (VIAGRA) 100 MG tablet TAKE 1 TABLET DAILY AS NEEDED ED   solifenacin (VESICARE) 10 MG tablet Take 1 tablet (10 mg total) by mouth daily.   tamsulosin (FLOMAX) 0.4 MG CAPS capsule TAKE 1 CAPSULE BY MOUTH EVERY DAY (Patient taking differently: Take 0.4 mg by mouth daily after breakfast.)   No facility-administered encounter medications on file as of 11/03/2021.   Reviewed chart prior to disease state call. Spoke with patient regarding BP  Recent Office Vitals: BP Readings from Last 3 Encounters:  11/14/21 131/78  10/02/21 101/62  08/17/21 116/66   Pulse Readings from Last 3 Encounters:  11/14/21 67  10/02/21 66  08/17/21 77    Wt Readings from Last 3 Encounters:  11/14/21 209 lb 3.2 oz (94.9 kg)  10/02/21 208 lb 9.6 oz (94.6 kg)  08/17/21 210 lb 12.8 oz (95.6 kg)     Kidney Function Lab Results  Component Value Date/Time   CREATININE 0.78 06/21/2021 11:28 AM   CREATININE 0.86 05/24/2021 04:18 PM   CREATININE 0.79 01/23/2016 12:03 PM   GFR 84.54 11/21/2020 03:22 PM   GFRNONAA >60 05/24/2021 04:18 PM   GFRAA 99 07/10/2018 10:39 AM       Latest  Ref Rng & Units 06/21/2021   11:28 AM 05/24/2021    4:18 PM 05/12/2021    3:35 AM  BMP  Glucose 70 - 99 mg/dL 115  111  100   BUN 8 - 27 mg/dL '12  18  17   '$ Creatinine 0.76 - 1.27 mg/dL 0.78  0.86  0.86   BUN/Creat Ratio 10 - 24 15     Sodium 134 - 144 mmol/L 140  138  137   Potassium 3.5 - 5.2 mmol/L 4.3  4.7  3.6   Chloride 96 - 106 mmol/L 103  106  106   CO2 20 - 29 mmol/L '26  25  24   '$ Calcium 8.6 - 10.2 mg/dL 9.1  8.9  8.6     Current antihypertensive regimen:  Cavedilol 25 mg twice  daily Furosemide 20 mg daily Entresto 49-51 mg twice daily  How often are you checking your Blood Pressure? daily  Current home BP readings: 112/74  What recent interventions/DTPs have been made by any provider to improve Blood Pressure control since last CPP Visit: No recent interventions or DTPs.  Any recent hospitalizations or ED visits since last visit with CPP? No  What diet changes have been made to improve Blood Pressure Control?  No recent diet changes.   What exercise is being done to improve your Blood Pressure Control?  Patient is not exercising at this time.  Adherence Review: Is the patient currently on ACE/ARB medication? Yes Does the patient have >5 day gap between last estimated fill dates? No  106 Care Gaps: Medicare Annual Wellness: Due now Ophthalmology Exam: Overdue since 08/18/2021 Foot Exam: Next due on 10/03/2022 Hemoglobin A1C: 5.5% on 11/14/2021 Colonoscopy: Completed 03/02/2015  Future Appointments  Date Time Provider [place]  12/11/2021  3:15 PM Criselda Peaches, DPM [facility]  01/11/2022  9:15 AM Debara Pickett, Nadean Corwin, MD [facility]  04/02/2022 10:20 AM Vivi Barrack, MD [facility]   Star Rating Drugs: Metformin 1000 mg last filled 11/14/2021 90 DS Entresto 49-51 mg last filled 10/17/2021 30 DS  April D Calhoun, [place] Pharmacist Assistant [phone]

## 2021-11-04 ENCOUNTER — Other Ambulatory Visit: Payer: Self-pay | Admitting: Family Medicine

## 2021-11-14 ENCOUNTER — Encounter: Payer: Self-pay | Admitting: Physician Assistant

## 2021-11-14 ENCOUNTER — Telehealth: Payer: Self-pay | Admitting: Family Medicine

## 2021-11-14 ENCOUNTER — Ambulatory Visit (INDEPENDENT_AMBULATORY_CARE_PROVIDER_SITE_OTHER): Payer: Medicare Other | Admitting: Physician Assistant

## 2021-11-14 VITALS — BP 131/78 | HR 67 | Temp 97.9°F | Ht 72.0 in | Wt 209.2 lb

## 2021-11-14 DIAGNOSIS — J069 Acute upper respiratory infection, unspecified: Secondary | ICD-10-CM

## 2021-11-14 DIAGNOSIS — E1149 Type 2 diabetes mellitus with other diabetic neurological complication: Secondary | ICD-10-CM

## 2021-11-14 DIAGNOSIS — L309 Dermatitis, unspecified: Secondary | ICD-10-CM

## 2021-11-14 LAB — POCT GLYCOSYLATED HEMOGLOBIN (HGB A1C): Hemoglobin A1C: 5.5 % (ref 4.0–5.6)

## 2021-11-14 MED ORDER — POTASSIUM CHLORIDE CRYS ER 20 MEQ PO TBCR: EXTENDED_RELEASE_TABLET | ORAL | 3 refills | Status: DC

## 2021-11-14 MED ORDER — SILDENAFIL CITRATE 100 MG PO TABS: ORAL_TABLET | ORAL | 22 refills | Status: DC

## 2021-11-14 MED ORDER — METFORMIN HCL 1000 MG PO TABS: ORAL_TABLET | ORAL | 1 refills | Status: DC

## 2021-11-14 MED ORDER — SOLIFENACIN SUCCINATE 10 MG PO TABS: 10.0000 mg | ORAL_TABLET | Freq: Every day | ORAL | 2 refills | Status: DC

## 2021-11-14 MED ORDER — GABAPENTIN 100 MG PO CAPS: ORAL_CAPSULE | ORAL | 3 refills | Status: DC

## 2021-11-14 MED ORDER — ACCU-CHEK AVIVA PLUS VI STRP: ORAL_STRIP | 11 refills | Status: DC

## 2021-11-14 MED ORDER — ACCU-CHEK FASTCLIX LANCETS MISC: 4 refills | Status: DC

## 2021-11-14 NOTE — Telephone Encounter (Signed)
Rx sent to requested pharmacy

## 2021-11-14 NOTE — Progress Notes (Signed)
Subjective:    Patient ID: [name], male    DOB: [birth year], 81 y.o.   MRN: [id number]  Chief Complaint  Patient presents with   Cough    Pt c/o cough and chest congestion past 5 days; pt says started as head cold and just comes and goes; pt has productive cough with yellow tinge, pt states cough is deep; received pneumonia vaccine 1-2 weeks ago here in the office.    Cough    Patient with hx CAD, T2DM, CHF, NICM, is in today for cough and chest congestion x 5 days. States it was worse the first few days, felt somewhat achy and tired. Starting to feel better in the last 24 hours. Still has heavy thick cough, but somewhat better. Taking Tylenol as needed. Has not been taking any cough syrup. Slight headache. -No SOB, CP, ST, nasal congestion, rash; no n/v/d -No recent travel, no known sick contacts  -No acute weight gain, no swelling in left leg -Vaccinated for COVID-19 x 3  Past Medical History:  Diagnosis Date   CAD (coronary artery disease)    a. Remote cath 2008 for chest pain showed 30% LAD with luminal irregularity and fairly heavy calcification in the mid LAD without critical stenosis, 30% OM1, 30% acute marginal.   Cancer (HCC)    skin cancer - basal   Cataract    both   Chronic combined systolic and diastolic CHF (congestive heart failure) (HCC)    Complication of anesthesia    Diabetes mellitus    Type II   ED (erectile dysfunction)    History of kidney stones    x1   Hypertension    NICM (nonischemic cardiomyopathy) ([place])    a. EF 40-45% by echo in 08/2017 - prior low risk stress test in 2017.   Osteoarthritis    Paroxysmal SVT (supraventricular tachycardia) (HCC)    PONV (postoperative nausea and vomiting) 1960   with Ether   PVC's (premature ventricular contractions)    PVD (peripheral vascular disease) (HCC)    s/p R BKA   Rheumatic fever    Rhinitis    S/P BKA (below knee amputation), right [facility])     Past Surgical History:  Procedure Laterality  Date   AMPUTATION Right 02/29/2016   Procedure: Right Leg AMPUTATION BELOW KNEE with Wound Vac placement;  Surgeon: [name], MD;  Location: [facility];  Service: Orthopedics;  Laterality: Right;   CARDIOVERSION N/A 06/08/2021   Procedure: CARDIOVERSION;  Surgeon: Jerline Pain, MD;  Location[facility];  Service: Cardiovascular;  Laterality: N/A;   CHOLECYSTECTOMY  1983   colonoscopy  2006, 2010   EYE SURGERY Bilateral    cataract   INGUINAL HERNIA REPAIR Right 1960   LEFT HEART CATH AND CORONARY ANGIOGRAPHY N/A 06/03/2018   Procedure: LEFT HEART CATH AND CORONARY ANGIOGRAPHY;  Surgeon: Belva Crome, MD;  Location: [facility];  Service: Cardiovascular;  Laterality: N/A;   STUMP REVISION Right 10/30/2017   Procedure: RIGHT BELOW KNEE AMPUTATION REVISION;  Surgeon: [name], MD;  Location: [facility];  Service: Orthopedics;  Laterality: Right;    Family History  Problem Relation Age of Onset   Stroke Father 72       Died age 27   Coronary artery disease Brother 8   Colon cancer Neg Hx    Colon polyps Neg Hx    Diabetes Neg Hx    Esophageal cancer Neg Hx    Kidney disease  Neg Hx    Gallbladder disease Neg Hx     Social History   Tobacco Use   Smoking status: Former    Years: 20.00    Types: Cigarettes    Quit date: 07/26/1981    Years since quitting: 40.3   Smokeless tobacco: Never  Vaping Use   Vaping Use: Never used  Substance Use Topics   Alcohol use: Not Currently    Comment: occasional glass of wine   Drug use: No     Allergies  Allergen Reactions   Lisinopril Cough    Review of Systems  Respiratory:  Positive for cough.    NEGATIVE UNLESS OTHERWISE INDICATED IN HPI      Objective:     BP 131/78 (BP Location: Left Arm)   Pulse 67   Temp 97.9 F (36.6 C) (Temporal)   Ht 6' (1.829 m)   Wt 209 lb 3.2 oz (94.9 kg)   SpO2 97%   BMI 28.37 kg/m   Wt Readings from Last 3 Encounters:  11/14/21 209 lb 3.2 oz (94.9 kg)  10/02/21 208 lb 9.6 oz  (94.6 kg)  08/17/21 210 lb 12.8 oz (95.6 kg)    BP Readings from Last 3 Encounters:  11/14/21 131/78  10/02/21 101/62  08/17/21 116/66     Physical Exam Vitals and nursing note reviewed.  Constitutional:      General: He is not in acute distress.    Appearance: Normal appearance. He is not ill-appearing.     Comments: Using a cane  HENT:     Head: Normocephalic.     Right Ear: Tympanic membrane and external ear normal.     Left Ear: External ear normal.     Mouth/Throat:     Mouth: Mucous membranes are moist.  Eyes:     Extraocular Movements: Extraocular movements intact.     Conjunctiva/sclera: Conjunctivae normal.     Pupils: Pupils are equal, round, and reactive to light.  Cardiovascular:     Rate and Rhythm: Normal rate and regular rhythm.     Pulses: Normal pulses.     Heart sounds: Normal heart sounds. No murmur heard. Pulmonary:     Effort: Pulmonary effort is normal. No respiratory distress.     Breath sounds: Normal breath sounds. No decreased air movement. No wheezing.  Musculoskeletal:     Cervical back: Normal range of motion.  Skin:    General: Skin is warm.  Neurological:     Mental Status: He is alert and oriented to person, place, and time.  Psychiatric:        Mood and Affect: Mood normal.        Behavior: Behavior normal.        Assessment & Plan:   Problem List Items Addressed This Visit   None Visit Diagnoses     Upper respiratory infection with cough and congestion    -  Primary   Type II diabetes mellitus with neurological manifestations ([place])       Relevant Orders   POCT HgB A1C (Completed)       1. Upper respiratory infection with cough and congestion Improving - reassured likely viral URI; no red flags, no signs of acute on chronic CHF. Recommend conservative tx at this time - see AVS. Would not recommend antibiotics. Pt agreeable.   2. Type II diabetes mellitus with neurological manifestations ([place]) Due for A1c - 5.5 today,  very good! Well controlled. Cont f/up with PCP  Return if symptoms worsen or fail to improve.  This note was prepared with assistance of Systems analyst. Occasional wrong-word or sound-a-like substitutions may have occurred due to the inherent limitations of voice recognition software.    Trany Chernick M Chevelle Coulson, PA-C

## 2021-11-14 NOTE — Patient Instructions (Signed)
Good to meet you today. I'm glad you're starting to feel better. I recommend Coricidin HBP over the counter to help with cough and congestion. You may also try plain Mucinex. Tea with honey or lemon.  Your hemoglobin A1c was 5.5 today - perfect!

## 2021-11-14 NOTE — Telephone Encounter (Signed)
Patient called regarding Patient does not want to use [facility] anymore.   Patient requests from now on all RX's be sent to:   [facility] [place] [place] Phone:  [phone]  Fax:  [phone]     Patient states he needs the following RX's sent to: [facility] [place], [place] - [address] Phone:  [phone]  Fax:  [phone]     ACCU-CHEK AVIVA PLUS test strip  Patient states he needs asap  Accu-Chek FastClix Lancets MISC Patient states he needs asap  gabapentin (NEURONTIN) 100 MG capsule  halobetasol (ULTRAVATE) 0.05 % cream  metFORMIN (GLUCOPHAGE) 1000 MG tablet  potassium chloride SA (KLOR-CON M20) 20 MEQ tablet  sildenafil (VIAGRA) 100 MG tablet solifenacin (VESICARE) 10 MG tablet

## 2021-11-15 ENCOUNTER — Other Ambulatory Visit: Payer: Self-pay | Admitting: *Deleted

## 2021-11-15 MED ORDER — ACCU-CHEK SOFTCLIX LANCETS MISC: 1.0000 | Freq: Three times a day (TID) | 11 refills | Status: DC

## 2021-11-15 MED ORDER — ACCU-CHEK AVIVA PLUS VI STRP: ORAL_STRIP | 11 refills | Status: DC

## 2021-11-16 ENCOUNTER — Other Ambulatory Visit: Payer: Self-pay | Admitting: *Deleted

## 2021-11-16 ENCOUNTER — Other Ambulatory Visit: Payer: Self-pay

## 2021-11-16 ENCOUNTER — Ambulatory Visit (HOSPITAL_COMMUNITY): Payer: Medicare Other | Attending: Cardiology

## 2021-11-16 ENCOUNTER — Ambulatory Visit (INDEPENDENT_AMBULATORY_CARE_PROVIDER_SITE_OTHER): Payer: Medicare Other | Admitting: Podiatry

## 2021-11-16 DIAGNOSIS — E08621 Diabetes mellitus due to underlying condition with foot ulcer: Secondary | ICD-10-CM | POA: Diagnosis not present

## 2021-11-16 DIAGNOSIS — L97422 Non-pressure chronic ulcer of left heel and midfoot with fat layer exposed: Secondary | ICD-10-CM

## 2021-11-16 DIAGNOSIS — I428 Other cardiomyopathies: Secondary | ICD-10-CM

## 2021-11-16 DIAGNOSIS — I5022 Chronic systolic (congestive) heart failure: Secondary | ICD-10-CM | POA: Diagnosis not present

## 2021-11-16 LAB — ECHOCARDIOGRAM COMPLETE
Area-P 1/2: 2.39 cm2
P 1/2 time: 771 msec
S' Lateral: 3.1 cm

## 2021-11-16 MED ORDER — ACCU-CHEK AVIVA PLUS VI STRP: ORAL_STRIP | 11 refills | Status: DC

## 2021-11-16 MED ORDER — ACCU-CHEK SOFTCLIX LANCETS MISC: 1.0000 | Freq: Three times a day (TID) | 11 refills | Status: DC

## 2021-11-16 MED ORDER — ACCU-CHEK SOFTCLIX LANCETS MISC: 4 refills | Status: AC

## 2021-11-17 ENCOUNTER — Other Ambulatory Visit: Payer: Self-pay

## 2021-11-17 ENCOUNTER — Telehealth: Payer: Self-pay | Admitting: Family Medicine

## 2021-11-17 MED ORDER — ACCU-CHEK AVIVA PLUS VI STRP: ORAL_STRIP | 11 refills | Status: AC

## 2021-11-17 MED ORDER — ACCU-CHEK AVIVA PLUS VI STRP
ORAL_STRIP | 11 refills | Status: DC
Start: 2021-11-17 — End: 2021-11-17

## 2021-11-18 ENCOUNTER — Other Ambulatory Visit: Payer: Self-pay | Admitting: Internal Medicine

## 2021-11-19 NOTE — Progress Notes (Signed)
  Subjective:  Patient ID: [name], male    DOB: June 20, 1940,  MRN: [id number]  Chief Complaint  Patient presents with   Diabetic Ulcer     3wk wound check left    81 y.o. male presents with the above complaint. History confirmed with patient.  He received the boot but he has difficulty walking it with his prosthetic.  Feels unsteady on the foot.  Objective:  Physical Exam: warm, good capillary refill and normal DP and PT pulses.  Diffuse polyneuropathy with loss of protective sensation Left Foot: There is a large full-thickness ulcer on the plantar medial arch measuring approximately 2.0 x2.0 x 0.5 cm with surrounding hyperkeratosis and a granular wound bed no exposed bone tendon or joint there is little to no serous drainage      Radiographs: Multiple views x-ray of the left foot: no soft tissue emphysema, no signs of osteomyelitis, he has collapse of the midfoot with osteoarthritis    IMPRESSION: 1. No evidence of osteomyelitis of the left foot. 2. Severe soft tissue edema along the dorsal aspect of the foot concerning for cellulitis. 3. Small fluid collection measuring 12 mm along the plantar aspect of the first TMT joint concerning for an adventitial bursa or ganglion cyst. 4. Severe osteoarthritis of the first TMT joint. 5. Moderate osteoarthritis of the second tarsometatarsal joint. 6. Mild osteoarthritis of the third and fourth tarsometatarsal joint. Moderate osteoarthritis of the navicular-cuneiform joint. Severe degenerative changes of the intermetatarsal joint of the first and second metatarsal bases.     Electronically Signed   By: Elige Ko M.D.    CBC and sed rate in normal limits    Assessment:   1. Diabetic ulcer of left midfoot associated with diabetes mellitus due to underlying condition, with fat layer exposed (HCC)       Plan:  Patient was evaluated and treated and all questions answered.  Ulcer left foot -We discussed the etiology  and factors that are a part of the wound healing process.  We also discussed the risk of infection both soft tissue and osteomyelitis from open ulceration.  Discussed the risk of limb loss if this happens or worsens. -Debridement as below. -Dressed with Prisma, DSD. -Continue home dressing changes daily with 4 x 4 gauze and Prisma silver collagen -Offloading has been difficult.  I do not think you have a good candidate for total contact cast either. -We discussed that if we do not see about a 50% improvement by the next visit then I would like to refer him to the wound care center for further management if there are any other modalities they have available to heal this such as hyperbaric oxygen therapy.  Procedure: Excisional Debridement of Wound Rationale: Removal of non-viable soft tissue from the wound to promote healing.  Anesthesia: none Post-Debridement Wound Measurements: 2.0x2.0 x 0.5 type of Debridement: Sharp Excisional Tissue Removed: Non-viable soft tissue Depth of Debridement: subcutaneous tissue. Technique: Sharp excisional debridement to bleeding, viable wound base.  Dressing: Dry, sterile, compression dressing. Disposition: Patient tolerated procedure well.         Return in about 3 weeks (around 12/07/2021) for wound care.

## 2021-11-20 ENCOUNTER — Telehealth: Payer: Self-pay | Admitting: Internal Medicine

## 2021-11-20 ENCOUNTER — Other Ambulatory Visit: Payer: Self-pay | Admitting: Internal Medicine

## 2021-11-20 DIAGNOSIS — I5022 Chronic systolic (congestive) heart failure: Secondary | ICD-10-CM

## 2021-11-20 DIAGNOSIS — I4891 Unspecified atrial fibrillation: Secondary | ICD-10-CM

## 2021-11-21 ENCOUNTER — Encounter: Payer: Self-pay | Admitting: Internal Medicine

## 2021-11-21 MED ORDER — FUROSEMIDE 20 MG PO TABS: 40.0000 mg | ORAL_TABLET | Freq: Every day | ORAL | 1 refills | Status: DC

## 2021-11-21 MED ORDER — CARVEDILOL 25 MG PO TABS: 25.0000 mg | ORAL_TABLET | Freq: Two times a day (BID) | ORAL | 3 refills | Status: DC

## 2021-11-21 MED ORDER — ENTRESTO 49-51 MG PO TABS: 1.0000 | ORAL_TABLET | Freq: Two times a day (BID) | ORAL | 1 refills | Status: DC

## 2021-11-21 MED ORDER — APIXABAN 5 MG PO TABS: 5.0000 mg | ORAL_TABLET | Freq: Two times a day (BID) | ORAL | 1 refills | Status: DC

## 2021-12-01 ENCOUNTER — Telehealth: Payer: Self-pay | Admitting: Family Medicine

## 2021-12-01 ENCOUNTER — Other Ambulatory Visit: Payer: Self-pay | Admitting: *Deleted

## 2021-12-01 MED ORDER — PANTOPRAZOLE SODIUM 40 MG PO TBEC: 40.0000 mg | DELAYED_RELEASE_TABLET | Freq: Every day | ORAL | 1 refills | Status: DC

## 2021-12-01 NOTE — Telephone Encounter (Signed)
Patient states he no longer uses [facility]. Patient requests the Rx for  pantoprazole (PROTONIX) 40 MG tablet  Be resent to [facility] [place], [place] Phone:  [phone]  Fax:  [phone]

## 2021-12-01 NOTE — Telephone Encounter (Signed)
[facility]

## 2021-12-11 ENCOUNTER — Ambulatory Visit (INDEPENDENT_AMBULATORY_CARE_PROVIDER_SITE_OTHER): Payer: Medicare Other | Admitting: Podiatry

## 2021-12-11 DIAGNOSIS — E08621 Diabetes mellitus due to underlying condition with foot ulcer: Secondary | ICD-10-CM | POA: Diagnosis not present

## 2021-12-11 DIAGNOSIS — L97422 Non-pressure chronic ulcer of left heel and midfoot with fat layer exposed: Secondary | ICD-10-CM | POA: Diagnosis not present

## 2021-12-14 ENCOUNTER — Encounter: Payer: Self-pay | Admitting: Podiatry

## 2021-12-14 NOTE — Progress Notes (Signed)
  Subjective:  Patient ID: [name], male    DOB: 02-26-1941,  MRN: [id number]  Chief Complaint  Patient presents with   Diabetic Ulcer    3 week wound care left midfoot    81 y.o. male presents with the above complaint. History confirmed with patient.  He received the boot but he has difficulty walking it with his prosthetic.  Feels unsteady on the foot.  Objective:  Physical Exam: warm, good capillary refill and normal DP and PT pulses.  Diffuse polyneuropathy with loss of protective sensation Left Foot: There is a large full-thickness ulcer on the plantar medial arch measuring approximately 1.8 x 1.8 x 0.3 cm with surrounding hyperkeratosis and a granular wound bed no exposed bone tendon or joint there is little to no serous drainage       Radiographs: Multiple views x-ray of the left foot: no soft tissue emphysema, no signs of osteomyelitis, he has collapse of the midfoot with osteoarthritis    IMPRESSION: 1. No evidence of osteomyelitis of the left foot. 2. Severe soft tissue edema along the dorsal aspect of the foot concerning for cellulitis. 3. Small fluid collection measuring 12 mm along the plantar aspect of the first TMT joint concerning for an adventitial bursa or ganglion cyst. 4. Severe osteoarthritis of the first TMT joint. 5. Moderate osteoarthritis of the second tarsometatarsal joint. 6. Mild osteoarthritis of the third and fourth tarsometatarsal joint. Moderate osteoarthritis of the navicular-cuneiform joint. Severe degenerative changes of the intermetatarsal joint of the first and second metatarsal bases.     Electronically Signed   By: Kathreen Devoid M.D.    CBC and sed rate in normal limits    Assessment:   1. Diabetic ulcer of left midfoot associated with diabetes mellitus due to underlying condition, with fat layer exposed ([place])       Plan:  Patient was evaluated and treated and all questions answered.  Ulcer left foot -We discussed  the etiology and factors that are a part of the wound healing process.  We also discussed the risk of infection both soft tissue and osteomyelitis from open ulceration.  Discussed the risk of limb loss if this happens or worsens. -Debridement as below. -Dressed with Prisma, DSD. -Continue home dressing changes daily with 4 x 4 gauze and Prisma silver collagen -Offloading has been difficult.  I do not think y he would be a good candidate for total contact cast either. -Has not had significant improvement at this point.  I am referring to the wound care center to see if they have any other treatment options or modalities to accelerate wound healing -Surgical exostectomy could offer alleviation of pressure and improvement in wound healing but I think it significant surgery would be difficult from him from a risk and recovery standpoint  Procedure: Excisional Debridement of Wound Rationale: Removal of non-viable soft tissue from the wound to promote healing.  Anesthesia: none Post-Debridement Wound Measurements: 1.8 x 1.8 x 0.3 type of Debridement: Sharp Excisional Tissue Removed: Non-viable soft tissue Depth of Debridement: subcutaneous tissue. Technique: Sharp excisional debridement to bleeding, viable wound base.  Dressing: Dry, sterile, compression dressing. Disposition: Patient tolerated procedure well.         No follow-ups on file.

## 2021-12-18 ENCOUNTER — Telehealth: Payer: Medicare Other

## 2021-12-19 ENCOUNTER — Ambulatory Visit (INDEPENDENT_AMBULATORY_CARE_PROVIDER_SITE_OTHER): Payer: Medicare Other | Admitting: Pharmacist

## 2021-12-19 DIAGNOSIS — I152 Hypertension secondary to endocrine disorders: Secondary | ICD-10-CM

## 2021-12-19 DIAGNOSIS — I5022 Chronic systolic (congestive) heart failure: Secondary | ICD-10-CM

## 2021-12-19 NOTE — Patient Instructions (Addendum)
Visit Information   Goals Addressed             This Visit's Progress    Track and Manage Fluids and Swelling-Heart Failure   On track    Timeframe:  Long-Range Goal Priority:  High Start Date: 02/27/21                            Expected End Date: 08/28/21                      Follow Up Date 05/30/21    - watch for swelling in feet, ankles and legs every day - weigh myself daily    Why is this important?   It is important to check your weight daily and watch how much salt and liquids you have.  It will help you to manage your heart failure.    Notes:        Patient Care Plan: General Pharmacy (Adult)     Problem Identified: HTN, HF, GERD, Hyperglycemia,   Priority: High  Onset Date: 02/27/2021     Long-Range Goal: Patient-Specific Goal   Start Date: 02/27/2021  Expected End Date: 09/27/2021  Recent Progress: On track  Priority: High  Note:    Current Barriers:  High copays  Pharmacist Clinical Goal(s):  Patient will maintain control of weight and BP as evidenced by monitoring  through collaboration with PharmD and provider.   Interventions: 1:1 collaboration with Vivi Barrack, MD regarding development and update of comprehensive plan of care as evidenced by provider attestation and co-signature Inter-disciplinary care team collaboration (see longitudinal plan of care) Comprehensive medication review performed; medication list updated in electronic medical record  Hypertension (BP goal <130/80) 12/19/21 -Controlled -Current treatment: Carvedilol '25mg'$  bid Appropriate, Effective, Safe, Accessible Entresto 49-'51mg'$  BID Appropriate, Effective, Safe, Accessible -Medications previously tried: none noted  -Current home readings: 110-120s/80s per his report, no logs reviewed -Denies hypotensive/hypertensive symptoms -Educated on BP goals and benefits of medications for prevention of heart attack, stroke and kidney damage; Importance of home blood pressure  monitoring; Symptoms of hypotension and importance of maintaining adequate hydration; - he denies any dizziness with lower BP readings. -Counseled to monitor BP at home daily, document, and provide log at future appointments -Recommended to continue current medication Interested in using an exercise device at home.  Counseled him on starting slow and working his way up to longer periods. Continue same meds for now.  Copay is high, however he does not qualify for any assistance programs due to income.  Heart Failure (Goal: manage symptoms and prevent exacerbations) 12/19/21 -Controlled -Last ejection fraction: 45% -Current treatment: Carvedilol '25mg'$  bid  Appropriate, Effective, Safe, Accessible Entresto 49-'51mg'$  BID Appropriate, Effective, Safe, Accessible Furosemide '20mg'$  prn Appropriate, Effective, Safe, Accessible -Medications previously tried: none noted  -Current home BP/HR readings: see HTN -Educated on Benefits of medications for managing symptoms and prolonging life Importance of weighing daily; if you gain more than 3 pounds in one day or 5 pounds in one week, contact providers He has been monitoring his weight consistently.  It has been staying right around 200lbs. -Recommended to continue current medication -Denies any swelling or SOB. -Counseled in importance of staying on Entresto even with high copay.  Patient Goals/Self-Care Activities Patient will:  - take medications as prescribed collaborate with provider on medication access solutions  Follow Up Plan: The care management team will reach out to the patient again over  the next 180 days.          The patient verbalized understanding of instructions, educational materials, and care plan provided today and DECLINED offer to receive copy of patient instructions, educational materials, and care plan.  Telephone follow up appointment with pharmacy team member scheduled for: 6 months  Beverly Milch, PharmD Clinical  Pharmacist  [facility] [phone]

## 2021-12-19 NOTE — Progress Notes (Signed)
Chronic Care Management Pharmacy Note  12/19/2021 Name:  [name] MRN:  [id number] DOB:  Sep 17, 1940  Summary: PharmD follow up, patient doing well overall.  Due for diabetic eye exam.  Reports he has appt scheduled for next month.  Adherent with all meds.  High copays on brand name drugs but income is too high for assistance. No issues at this time.  Recommendations/Changes made from today's visit: None no changes needed  Plan: FU 6 months   Subjective: [name] is an 81 y.o. year old male who is a primary patient of Jerline Pain, Algis Greenhouse, MD.  The CCM team was consulted for assistance with disease management and care coordination needs.    Engaged with patient by telephone for follow up visit in response to provider referral for pharmacy case management and/or care coordination services.   Consent to Services:  The patient was given the following information about Chronic Care Management services today, agreed to services, and gave verbal consent: 1. CCM service includes personalized support from designated clinical staff supervised by the primary care provider, including individualized plan of care and coordination with other care providers 2. 24/7 contact phone numbers for assistance for urgent and routine care needs. 3. Service will only be billed when office clinical staff spend 20 minutes or more in a month to coordinate care. 4. Only one practitioner may furnish and bill the service in a calendar month. 5.The patient may stop CCM services at any time (effective at the end of the month) by phone call to the office staff. 6. The patient will be responsible for cost sharing (co-pay) of up to 20% of the service fee (after annual deductible is met). Patient agreed to services and consent obtained.  Patient Care Team: Vivi Barrack, MD as PCP - General (Family Medicine) Debara Pickett Nadean Corwin, MD as PCP - Cardiology (Cardiology) Katy Apo, MD as Consulting Physician  (Ophthalmology) Edythe Clarity, [facility] (Pharmacist)  Recent office visits:  02/24/21 ([place]) - possible rosacea - treated with metronidazole cream BID 01/03/2021 [facility] (PCP) Dimas Chyle MD; chronic follow up, no medication changes indicated.   12/01/2020 [facility] (PCP) Dimas Chyle MD; chronic follow up, no medication changes indicated.   Recent consult visits:  None   Hospital visits:  None in previous 6 months   Objective:  Lab Results  Component Value Date   CREATININE 0.78 06/21/2021   BUN 12 06/21/2021   GFR 84.54 11/21/2020   GFRNONAA >60 05/24/2021   GFRAA 99 07/10/2018   NA 140 06/21/2021   K 4.3 06/21/2021   CALCIUM 9.1 06/21/2021   CO2 26 06/21/2021   GLUCOSE 115 (H) 06/21/2021    Lab Results  Component Value Date/Time   HGBA1C 5.5 11/14/2021 07:48 AM   HGBA1C 5.9 (H) 05/11/2021 06:19 AM   HGBA1C 5.4 03/11/2020 08:52 AM   HGBA1C 5.8 09/04/2019 09:18 AM   GFR 84.54 11/21/2020 03:22 PM   GFR 81.66 07/07/2020 10:02 AM   MICROALBUR 0.7 09/11/2016 12:06 PM   MICROALBUR <0.7 07/12/2015 04:35 PM    Last diabetic Eye exam:  Lab Results  Component Value Date/Time   HMDIABEYEEXA No Retinopathy 08/18/2020 12:00 AM    Last diabetic Foot exam: No results found for: "HMDIABFOOTEX"   Lab Results  Component Value Date   CHOL 148 09/04/2019   HDL 36.00 (L) 09/04/2019   LDLCALC 93 09/04/2019   LDLDIRECT 84.0 09/10/2016   TRIG 94.0 09/04/2019   CHOLHDL 4 09/04/2019  Latest Ref Rng & Units 11/21/2020    3:22 PM 07/07/2020   10:02 AM 09/04/2019    9:18 AM  Hepatic Function  Total Protein 6.0 - 8.3 g/dL 6.4  6.6  6.4   Albumin 3.5 - 5.2 g/dL 4.1  4.1  4.3   AST 0 - 37 U/L _0 ALT 0 - 53 U/L _1 Alk Phosphatase 39 - 117 U/L 59  61  58   Total Bilirubin 0.2 - 1.2 mg/dL 0.7  0.7  0.8     Lab Results  Component Value Date/Time   TSH 1.958 05/11/2021 03:29 PM   TSH 1.37 07/07/2020 10:02 AM   TSH 1.92 09/04/2019 09:18 AM       Latest Ref  Rng & Units 10/13/2021   11:21 AM 05/24/2021    4:18 PM 05/11/2021    6:19 AM  CBC  WBC 3.4 - 10.8 x10E3/uL 5.8  8.5  5.4   Hemoglobin 13.0 - 17.7 g/dL 13.0  13.8  14.8   Hematocrit 37.5 - 51.0 % 37.7  42.9  43.4   Platelets 150 - 450 x10E3/uL 225  231  176     No results found for: "VD25OH"  Clinical ASCVD: No  The ASCVD Risk score (Arnett DK, et al., 2019) failed to calculate for the following reasons:   The 2019 ASCVD risk score is only valid for ages 56 to 14       01/03/2021   11:06 AM 12/01/2020    1:06 PM 03/11/2020    9:45 AM  Depression screen PHQ 2/9  Decreased Interest 1 1 0  Down, Depressed, Hopeless 1 1 0  PHQ - 2 Score 2 2 0  Altered sleeping 0 0 0  Tired, decreased energy 1 1 0  Change in appetite 0 0 0  Feeling bad or failure about yourself  0 1 0  Trouble concentrating 0 0 0  Moving slowly or fidgety/restless 0 0 0  Suicidal thoughts 0 0 0  PHQ-9 Score 3 4 0  Difficult doing work/chores Not difficult at all Somewhat difficult Not difficult at all      Social History   Tobacco Use  Smoking Status Former   Years: 20.00   Types: Cigarettes   Quit date: 07/26/1981   Years since quitting: 40.4  Smokeless Tobacco Never   BP Readings from Last 3 Encounters:  11/14/21 131/78  10/02/21 101/62  08/17/21 116/66   Pulse Readings from Last 3 Encounters:  11/14/21 67  10/02/21 66  08/17/21 77   Wt Readings from Last 3 Encounters:  11/14/21 209 lb 3.2 oz (94.9 kg)  10/02/21 208 lb 9.6 oz (94.6 kg)  08/17/21 210 lb 12.8 oz (95.6 kg)   BMI Readings from Last 3 Encounters:  11/14/21 28.37 kg/m  10/02/21 28.29 kg/m  08/17/21 28.59 kg/m    Assessment/Interventions: Review of patient past medical history, allergies, medications, health status, including review of consultants reports, laboratory and other test data, was performed as part of comprehensive evaluation and provision of chronic care management services.   SDOH:  (Social Determinants of Health)  assessments and interventions performed: No, reviewed within last year  Financial Resource Strain: Medium Risk (05/11/2021)   Overall Financial Resource Strain (CARDIA)    Difficulty of Paying Living Expenses: Somewhat hard    SDOH Screenings   Alcohol Screen: Low Risk  (05/11/2021)   Alcohol Screen    Last Alcohol Screening  Score (AUDIT): 1  Depression (PHQ2-9): Low Risk  (01/03/2021)   Depression (PHQ2-9)    PHQ-2 Score: 3  Financial Resource Strain: Medium Risk (05/11/2021)   Overall Financial Resource Strain (CARDIA)    Difficulty of Paying Living Expenses: Somewhat hard  Food Insecurity: No Food Insecurity (05/11/2021)   Hunger Vital Sign    Worried About Running Out of Food in the Last Year: Never true    Ran Out of Food in the Last Year: Never true  Housing: Low Risk  (05/11/2021)   Housing    Last Housing Risk Score: 0  Physical Activity: Not on file  Social Connections: Not on file  Stress: Not on file  Tobacco Use: Medium Risk (12/14/2021)   Patient History    Smoking Tobacco Use: Former    Smokeless Tobacco Use: Never    Passive Exposure: Not on file  Transportation Needs: No Transportation Needs (05/11/2021)   PRAPARE - Hydrologist (Medical): No    Lack of Transportation (Non-Medical): No    CCM Care Plan  Allergies  Allergen Reactions   Lisinopril Cough    Medications Reviewed Today     Reviewed by Edythe Clarity, [facility] (Pharmacist) on 12/19/21 at 1116  Med List Status: <None>   Medication Order Taking? Sig Documenting Provider Last Dose Status Informant  Accu-Chek Softclix Lancets lancets [id number] Yes Use to check blood sugar three times a day. Vivi Barrack, MD Taking Active   acetaminophen (TYLENOL) 500 MG tablet [id number] Yes Take 500-1,000 mg by mouth every 6 (six) hours as needed (pain). [provider] Taking Active Self  apixaban (ELIQUIS) 5 MG TABS tablet [id number] Yes Take 1 tablet (5 mg total) by  mouth 2 (two) times daily. Pixie Casino, MD Taking Active   carvedilol (COREG) 25 MG tablet [id number] Yes Take 1 tablet (25 mg total) by mouth 2 (two) times daily. Pixie Casino, MD Taking Active   cephALEXin [facility]) 500 MG capsule [id number] Yes Take 1 capsule (500 mg total) by mouth 3 (three) times daily. Criselda Peaches, DPM Taking Active   furosemide (LASIX) 20 MG tablet [id number] Yes Take 2 tablets (40 mg total) by mouth daily. Pixie Casino, MD Taking Active   gabapentin (NEURONTIN) 100 MG capsule [id number] Yes TAKE 1 CAPSULE BY MOUTH THREE TIMES A DAY Strength: 100 mg Vivi Barrack, MD Taking Active   glucose blood (ACCU-CHEK AVIVA PLUS) test strip [id number] Yes Use 3 times daily. Morning, noon, and nightly to check glucose levels Dx: E11.9 (type 2 diabetes mellitus) Vivi Barrack, MD Taking Active   halobetasol (ULTRAVATE) 0.05 % cream [id number] Yes APPLY TO AFFECTED AREA TWICE A DAY  Patient taking differently: Apply 1 application  topically 2 (two) times daily as needed (skin irritation).   Vivi Barrack, MD Taking Active Self  metFORMIN (GLUCOPHAGE) 1000 MG tablet [id number] Yes TAKE 1 TABLET BY MOUTH EVERY DAY WITH BREAKFAST Vivi Barrack, MD Taking Active   mupirocin ointment (BACTROBAN) 2 % [id number] Yes Apply 1 application. topically daily. Criselda Peaches, DPM Taking Active   nystatin cream (MYCOSTATIN) [id number] Yes APPLY TO AFFECTED AREA TWICE A DAY  Patient taking differently: Apply 1 application  topically 2 (two) times daily as needed (skin irritation).   Vivi Barrack, MD Taking Active Self  pantoprazole (PROTONIX) 40 MG tablet [id number] Yes Take 1 tablet (40 mg total) by mouth daily. Vivi Barrack, MD Taking Active   potassium chloride  SA (KLOR-CON M20) 20 MEQ tablet [id number] Yes TAKE 2 TABLETS BY MOUTH EVERY MORNING Vivi Barrack, MD Taking Active   sacubitril-valsartan [facility]) 49-51 MG [id number] Yes Take 1 tablet by mouth 2 (two) times daily.  Pixie Casino, MD Taking Active   sildenafil (VIAGRA) 100 MG tablet [id number] Yes TAKE 1 TABLET DAILY AS NEEDED ED Vivi Barrack, MD Taking Active   solifenacin (VESICARE) 10 MG tablet [id number] Yes Take 1 tablet (10 mg total) by mouth daily. Vivi Barrack, MD Taking Active   tamsulosin [facility]) 0.4 MG CAPS capsule [id number] Yes TAKE 1 CAPSULE BY MOUTH EVERY DAY  Patient taking differently: Take 0.4 mg by mouth daily after breakfast.   Pixie Casino, MD Taking Active Self            Patient Active Problem List   Diagnosis Date Noted   Coronary artery disease involving native coronary artery of native heart 08/17/2021   PAF (paroxysmal atrial fibrillation) ([place]) 08/17/2021   Diabetic foot ulcer ([place]) 05/15/2021   Alcohol use 05/15/2021   A-fib ([place]) 05/11/2021   Gastroesophageal reflux disease 12/08/2019   Dermatitis 03/10/2018   Benign prostatic hyperplasia with nocturia 11/12/2017   Nonischemic cardiomyopathy ([place]) 09/03/2017   AVNRT (AV nodal re-entry tachycardia) ([place]) 01/30/2017   Chronic midline low back pain without sciatica 01/03/2017   H/O amputation of leg through tibia and fibula, right ([place]) 05/03/2016   Phantom limb pain ([place]) 03/06/2016   SVT (supraventricular tachycardia) ([place]) 05/39/7673   Chronic systolic heart failure ([place]) 01/20/2016   Diabetic peripheral neuropathy ([place]) 08/23/2015   Esophageal dysphagia 08/06/2014   Anxiety state 10/26/2013   T2DM (type 2 diabetes mellitus) ([place]) 11/02/2010   BASAL CELL CARCINOMA, FACE 10/18/2008   LIPOMA, SKIN 05/31/2008   ERECTILE DYSFUNCTION 09/30/2007   OSTEOARTHRITIS 08/08/2007   Hypertension associated with diabetes ([place]) 03/24/2007    Immunization History  Administered Date(s) Administered   Fluad Quad(high Dose 65+) 01/27/2019, 03/11/2020   Influenza Split 04/10/2011   Influenza Whole 02/25/2009   Influenza, High Dose Seasonal PF 05/28/2016, 03/20/2017, 04/27/2017   Influenza-Unspecified 02/25/2013,  02/26/2015, 02/27/2021   Moderna Sars-Covid-2 Vaccination 06/11/2019, 07/10/2019, 04/02/2020   PNEUMOCOCCAL CONJUGATE-20 10/02/2021   Pneumococcal Polysaccharide-23 05/28/2005   Td 05/28/2006, 09/10/2016   Varicella 04/08/2012    Conditions to be addressed/monitored:  HTN, HF, GERD, Hyperglycemia,   Care Plan : General Pharmacy (Adult)  Updates made by Edythe Clarity, RPH since 12/19/2021 12:00 AM     Problem: HTN, HF, GERD, Hyperglycemia,   Priority: High  Onset Date: 02/27/2021     Long-Range Goal: Patient-Specific Goal   Start Date: 02/27/2021  Expected End Date: 09/27/2021  Recent Progress: On track  Priority: High  Note:    Current Barriers:  High copays  Pharmacist Clinical Goal(s):  Patient will maintain control of weight and BP as evidenced by monitoring  through collaboration with PharmD and provider.   Interventions: 1:1 collaboration with Vivi Barrack, MD regarding development and update of comprehensive plan of care as evidenced by provider attestation and co-signature Inter-disciplinary care team collaboration (see longitudinal plan of care) Comprehensive medication review performed; medication list updated in electronic medical record  Hypertension (BP goal <130/80) 12/19/21 -Controlled -Current treatment: Carvedilol 43m bid Appropriate, Effective, Safe, Accessible Entresto 49-57mBID Appropriate, Effective, Safe, Accessible -Medications previously tried: none noted  -Current home readings: 110-120s/80s per his report, no logs reviewed -Denies hypotensive/hypertensive symptoms -Educated on BP goals and benefits of medications for prevention of heart  attack, stroke and kidney damage; Importance of home blood pressure monitoring; Symptoms of hypotension and importance of maintaining adequate hydration; - he denies any dizziness with lower BP readings. -Counseled to monitor BP at home daily, document, and provide log at future appointments -Recommended  to continue current medication Interested in using an exercise device at home.  Counseled him on starting slow and working his way up to longer periods. Continue same meds for now.  Copay is high, however he does not qualify for any assistance programs due to income.  Heart Failure (Goal: manage symptoms and prevent exacerbations) 12/19/21 -Controlled -Last ejection fraction: 45% -Current treatment: Carvedilol 7m bid  Appropriate, Effective, Safe, Accessible Entresto 49-569mBID Appropriate, Effective, Safe, Accessible Furosemide 2018mrn Appropriate, Effective, Safe, Accessible -Medications previously tried: none noted  -Current home BP/HR readings: see HTN -Educated on Benefits of medications for managing symptoms and prolonging life Importance of weighing daily; if you gain more than 3 pounds in one day or 5 pounds in one week, contact providers He has been monitoring his weight consistently.  It has been staying right around 200lbs. -Recommended to continue current medication -Denies any swelling or SOB. -Counseled in importance of staying on Entresto even with high copay.  Patient Goals/Self-Care Activities Patient will:  - take medications as prescribed collaborate with provider on medication access solutions  Follow Up Plan: The care management team will reach out to the patient again over the next 180 days.           Medication Assistance: None required.  Patient affirms current coverage meets needs.  Compliance/Adherence/Medication fill history: Care Gaps: Due for diabetic eye exam - patient reports he has one scheduled for next month Statin in DM - patient refused  Star-Rating Drugs: Metformin 1000m46m16/23 90ds  [facility]:  [id number] [id number]: [phone]: [phone] [address] [id number]: [phone]: [phone] pill box? Yes Pt endorses 100% compliance  We discussed: Benefits of medication synchronization, packaging and delivery as well as enhanced pharmacist oversight with [employer]. Patient decided to: Continue current medication management strategy  Care Plan and Follow Up Patient Decision:  Patient agrees to Care Plan and Follow-up.  Plan: The care management team will reach out to the patient again over the next 180 days.  ChriBeverly MilcharmD Clinical Pharmacist [phone]

## 2021-12-25 DIAGNOSIS — E1159 Type 2 diabetes mellitus with other circulatory complications: Secondary | ICD-10-CM

## 2021-12-25 DIAGNOSIS — I5022 Chronic systolic (congestive) heart failure: Secondary | ICD-10-CM | POA: Diagnosis not present

## 2021-12-25 DIAGNOSIS — I152 Hypertension secondary to endocrine disorders: Secondary | ICD-10-CM

## 2021-12-25 NOTE — Progress Notes (Signed)
Tyrone, Roberts ([id number]) Visit Report for 12/26/2021 Allergy List Details Patient Name: Date of Service: [name] 12/26/2021 1:15 PM Medical Record Number: [id number] Patient Account Number: [id number] Date of Birth/Sex: [name]: 12-19-1940 (81 y.o. [name] Primary Care Allix Blomquist: Dimas Chyle Other Clinician: Referring Glenwood Revoir: Tyrone Alanda Colton/Extender: Larwance Sachs in Treatment: 0 Allergies Active Allergies lisinopril Allergy Notes Electronic Signature(s) Signed: 12/26/2021 4:16:13 PM By: Lorrin Jackson Previous Signature: 12/25/2021 1:32:36 PM Version By: Lorrin Jackson Entered By: Lorrin Jackson on 12/26/2021 13:25:51 -------------------------------------------------------------------------------- Arrival Information Details Patient Name: Date of Service: [name], Tyrone [name] 12/26/2021 1:15 PM Medical Record Number: [id number] Patient Account Number: [id number] Date of Birth/Sex: [name]: 28-Aug-1940 (81 y.o. [name] Primary Care Uriah Trueba: Dimas Chyle Other Clinician: Referring Lucrezia Dehne: Tyrone Ashly Goethe/Extender: Larwance Sachs in Treatment: 0 Visit Information Patient Arrived: [name] Time: 13:24 Transfer Assistance: None Patient Identification Verified: Yes Secondary Verification Process Completed: Yes Patient Requires Transmission-Based Precautions: No Patient Has Alerts: Yes Patient Alerts: Patient on Blood Thinner ABI: R=Non Comp Electronic Signature(s) Signed: 12/26/2021 4:16:13 PM By: Lorrin Jackson Entered By: Lorrin Jackson on 12/26/2021 13:46:32 -------------------------------------------------------------------------------- Clinic Level of Care Assessment Details Patient Name: Date of Service: Tyrone Mccallum E. 12/26/2021 1:15 PM Medical Record Number: [id number] Patient Account Number: [id number] Date of Birth/Sex: [name]: 1940/07/11 (81 y.o. [name] Primary Care Bertrand Vowels: Dimas Chyle Other Clinician: Referring Eriel Dunckel: Tyrone Jarmon Javid/Extender: Larwance Sachs in Treatment: 0 Clinic Level of Care Assessment Items TOOL 2 Quantity Score X- 1 0 Use when only an EandM is performed on the INITIAL visit ASSESSMENTS - Nursing Assessment / Reassessment X- 1 20 General Physical Exam (combine w/ comprehensive assessment (listed just below) when performed on new pt. evals) X- 1 25 Comprehensive Assessment (HX, ROS, Risk Assessments, Wounds Hx, etc.) ASSESSMENTS - Wound and Skin A ssessment / Reassessment X - Simple Wound Assessment / Reassessment - one wound 1 5 '[]'$  - 0 Complex Wound Assessment / Reassessment - multiple wounds '[]'$  - 0 Dermatologic / Skin Assessment (not related to wound area) ASSESSMENTS - Ostomy and/or Continence Assessment and Care '[]'$  - 0 Incontinence Assessment and Management '[]'$  - 0 Ostomy Care Assessment and Management (repouching, etc.) PROCESS - Coordination of Care '[]'$  - 0 Simple Patient / Family Education for ongoing care X- 1 20 Complex (extensive) Patient / Family Education for ongoing care X- 1 10 Staff obtains Programmer, systems, Records, T Results / Process Orders est X- 1 10 Staff telephones HHA, Nursing Homes / Clarify orders / etc '[]'$  - 0 Routine Transfer to another Facility (non-emergent condition) '[]'$  - 0 Routine Hospital Admission (non-emergent condition) '[]'$  - 0 New Admissions / Biomedical engineer / Ordering NPWT Apligraf, etc. , '[]'$  - 0 Emergency Hospital Admission (emergent condition) '[]'$  - 0 Simple Discharge Coordination '[]'$  - 0 Complex (extensive) Discharge Coordination PROCESS - Special Needs '[]'$  - 0 Pediatric / Minor Patient Management '[]'$  - 0 Isolation Patient Management '[]'$  - 0 Hearing / Language / Visual special needs '[]'$  - 0 Assessment of Community assistance (transportation, D/C planning, etc.) '[]'$  - 0 Additional assistance / Altered mentation '[]'$  -  0 Support Surface(s) Assessment (bed, cushion, seat, etc.) INTERVENTIONS - Wound Cleansing / Measurement X- 1 5 Wound Imaging (photographs - any number of wounds) '[]'$  - 0 Wound Tracing (instead of photographs) X- 1 5 Simple Wound Measurement - one wound '[]'$  - 0 Complex Wound Measurement -  multiple wounds X- 1 5 Simple Wound Cleansing - one wound '[]'$  - 0 Complex Wound Cleansing - multiple wounds INTERVENTIONS - Wound Dressings X - Small Wound Dressing one or multiple wounds 1 10 '[]'$  - 0 Medium Wound Dressing one or multiple wounds '[]'$  - 0 Large Wound Dressing one or multiple wounds '[]'$  - 0 Application of Medications - injection INTERVENTIONS - Miscellaneous '[]'$  - 0 External ear exam '[]'$  - 0 Specimen Collection (cultures, biopsies, blood, body fluids, etc.) '[]'$  - 0 Specimen(s) / Culture(s) sent or taken to Lab for analysis '[]'$  - 0 Patient Transfer (multiple staff / Civil Service fast streamer / Similar devices) '[]'$  - 0 Simple Staple / Suture removal (25 or less) '[]'$  - 0 Complex Staple / Suture removal (26 or more) '[]'$  - 0 Hypo / Hyperglycemic Management (close monitor of Blood Glucose) X- 1 15 Ankle / Brachial Index (ABI) - do not check if billed separately Has the patient been seen at the hospital within the last three years: Yes Total Score: 130 Level Of Care: New/Established - Level 4 Electronic Signature(s) Signed: 12/26/2021 4:16:13 PM By: Lorrin Jackson Entered By: Lorrin Jackson on 12/26/2021 14:11:58 -------------------------------------------------------------------------------- Encounter Discharge Information Details Patient Name: Date of Service: [name], Tyrone [name] 12/26/2021 1:15 PM Medical Record Number: [id number] Patient Account Number: [id number] Date of Birth/Sex: [name]: 05-Mar-1941 (81 y.o. [name] Primary Care Analaya Hoey: Dimas Chyle Other Clinician: Referring Shaneil Yazdi: Tyrone Mamoudou Mulvehill/Extender: Larwance Sachs in Treatment: 0 Encounter  Discharge Information Items Discharge Condition: Stable Ambulatory Status: Cane Discharge Destination: Home Transportation: Private Auto Schedule Follow-up Appointment: Yes Clinical Summary of Care: Provided on 12/26/2021 Form Type Recipient Paper Patient Patient Electronic Signature(s) Signed: 12/26/2021 4:16:13 PM By: Lorrin Jackson Entered By: Lorrin Jackson on 12/26/2021 14:19:36 -------------------------------------------------------------------------------- Lower Extremity Assessment Details Patient Name: Date of Service: [name] 12/26/2021 1:15 PM Medical Record Number: [id number] Patient Account Number: [id number] Date of Birth/Sex: [name]: 1940/05/30 (81 y.o. [name] Primary Care Lambert Jeanty: Dimas Chyle Other Clinician: Referring Caitlan Chauca: Tyrone Bevelyn Arriola/Extender: Larwance Sachs in Treatment: 0 Edema Assessment Assessed: Shirlyn Goltz: Yes] Patrice Paradise: No] Edema: [Left: Ye] [Right: s] Calf Left: Right: Point of Measurement: 31 cm From Medial Instep 34 cm Ankle Left: Right: Point of Measurement: 9 cm From Medial Instep 29 cm Knee To Floor Left: Right: From Medial Instep 39 cm Vascular Assessment Pulses: Dorsalis Pedis Palpable: [Left:Yes Yes] Notes ABI=Non Compressible Electronic Signature(s) Signed: 12/26/2021 4:16:13 PM By: Lorrin Jackson Entered By: Lorrin Jackson on 12/26/2021 13:38:47 -------------------------------------------------------------------------------- Multi Wound Chart Details Patient Name: Date of Service: [name], Tyrone [name] 12/26/2021 1:15 PM Medical Record Number: [id number] Patient Account Number: [id number] Date of Birth/Sex: [name]: 07-17-1940 (81 y.o. [name] Primary Care Corey Caulfield: Dimas Chyle Other Clinician: Referring Huyen Perazzo: Tyrone Xerxes Agrusa/Extender: Larwance Sachs in Treatment: 0 Vital Signs Height(in): 72 Capillary Blood Glucose(mg/dl):  109 Weight(lbs): 198 Pulse(bpm): 37 Body Mass Index(BMI): 26.9 Blood Pressure(mmHg): 146/79 Temperature(F): 97.9 Respiratory Rate(breaths/min): 18 Photos: [N/A:N/A] Left, Plantar Foot N/A N/A Wound Location: Gradually Appeared N/A N/A Wounding Event: Diabetic Wound/Ulcer of the Lower N/A N/A Primary Etiology: Extremity Arrhythmia, Congestive Heart Failure, N/A N/A Comorbid History: Coronary Artery Disease, Type II Diabetes, Osteoarthritis, Neuropathy 01/24/2021 N/A N/A Date Acquired: 0 N/A N/A Weeks of Treatment: Open N/A N/A Wound Status: No N/A N/A Wound Recurrence: 1.2x1.7x0.3 N/A N/A Measurements L x W x D (cm) 1.602 N/A N/A A (cm) : rea 0.481 N/A  N/A Volume (cm) : 0.00% N/A N/A % Reduction in A rea: 0.00% N/A N/A % Reduction in Volume: 7 Starting Position 1 (o'clock): 10 Ending Position 1 (o'clock): 0.3 Maximum Distance 1 (cm): Yes N/A N/A Undermining: Grade 1 N/A N/A Classification: Medium N/A N/A Exudate A mount: Serosanguineous N/A N/A Exudate Type: red, brown N/A N/A Exudate Color: Well defined, not attached N/A N/A Wound Margin: Large (67-100%) N/A N/A Granulation A mount: Red, Hyper-granulation N/A N/A Granulation Quality: None Present (0%) N/A N/A Necrotic A mount: Fat Layer (Subcutaneous Tissue): Yes N/A N/A Exposed Structures: Fascia: No Tendon: No Muscle: No Joint: No Bone: No None N/A N/A Epithelialization: Calloused Periwound N/A N/A Assessment Notes: Treatment Notes Electronic Signature(s) Signed: 12/26/2021 3:04:33 PM By: Kalman Shan DO Signed: 12/26/2021 4:16:13 PM By: Lorrin Jackson Entered By: Kalman Shan on 12/26/2021 14:09:49 -------------------------------------------------------------------------------- Multi-Disciplinary Care Plan Details Patient Name: Date of Service: [name], Tyrone [name] 12/26/2021 1:15 PM Medical Record Number: [id number] Patient Account Number: [id number] Date of  Birth/Sex: [name]: Jul 20, 1940 (81 y.o. [name] Primary Care Eithen Castiglia: Dimas Chyle Other Clinician: Referring Moniqua Engebretsen: Tyrone Elroy Schembri/Extender: Larwance Sachs in Treatment: 0 Active Inactive Wound/Skin Impairment Nursing Diagnoses: Impaired tissue integrity Goals: Patient/caregiver will verbalize understanding of skin care regimen Date Initiated: 12/26/2021 Target Resolution Date: 01/23/2022 Goal Status: Active Ulcer/skin breakdown will have a volume reduction of 30% by week 4 Date Initiated: 12/26/2021 Target Resolution Date: 01/23/2022 Goal Status: Active Interventions: Assess patient/caregiver ability to obtain necessary supplies Assess patient/caregiver ability to perform ulcer/skin care regimen upon admission and as needed Assess ulceration(s) every visit Provide education on ulcer and skin care Treatment Activities: Topical wound management initiated : 12/26/2021 Notes: Electronic Signature(s) Signed: 12/26/2021 4:16:13 PM By: Lorrin Jackson Entered By: Lorrin Jackson on 12/26/2021 14:00:23 -------------------------------------------------------------------------------- Pain Assessment Details Patient Name: Date of Service: [name], Tyrone [name] 12/26/2021 1:15 PM Medical Record Number: [id number] Patient Account Number: [id number] Date of Birth/Sex: [name]: Jun 05, 1940 (81 y.o. [name] Primary Care Syler Norcia: Dimas Chyle Other Clinician: Referring Amaira Safley: Tyrone Churchill Grimsley/Extender: Larwance Sachs in Treatment: 0 Active Problems Location of Pain Severity and Description of Pain Patient Has Paino No Site Locations Pain Management and Medication Current Pain Management: Electronic Signature(s) Signed: 12/26/2021 4:16:13 PM By: Lorrin Jackson Entered By: Lorrin Jackson on 12/26/2021 13:42:09 -------------------------------------------------------------------------------- Patient/Caregiver  Education Details Patient Name: Date of Service: [name] 8/1/2023andnbsp1:15 PM Medical Record Number: [id number] Patient Account Number: [id number] Date of Birth/Gender: [name]: 04/19/1941 (81 y.o. [name] Primary Care Physician: Dimas Chyle Other Clinician: Referring Physician: Treating Physician/Extender: Larwance Sachs in Treatment: 0 Education Assessment Education Provided To: Patient Education Topics Provided Offloading: Methods: Explain/Verbal, Printed Responses: State content correctly Wound/Skin Impairment: Methods: Demonstration, Explain/Verbal, Printed Responses: State content correctly Electronic Signature(s) Signed: 12/26/2021 4:16:13 PM By: Lorrin Jackson Entered By: Lorrin Jackson on 12/26/2021 14:00:41 -------------------------------------------------------------------------------- Wound Assessment Details Patient Name: Date of Service: [name], Tyrone [name] 12/26/2021 1:15 PM Medical Record Number: [id number] Patient Account Number: [id number] Date of Birth/Sex: [name]: 1940-08-13 (81 y.o. [name] Primary Care Zadaya Cuadra: Dimas Chyle Other Clinician: Referring Lenvil Swaim: Tyrone Henning Ehle/Extender: Larwance Sachs in Treatment: 0 Wound Status Wound Number: 1 Primary Diabetic Wound/Ulcer of the Lower Extremity Etiology: Wound Location: Left, Plantar Foot Wound Open Wounding Event: Gradually Appeared Status: Date Acquired: 01/24/2021 Comorbid Arrhythmia, Congestive Heart Failure, Coronary Artery Disease, Weeks Of Treatment: 0 History:  Type II Diabetes, Osteoarthritis, Neuropathy Clustered Wound: No Photos Wound Measurements Length: (cm) 1.2 Width: (cm) 1.7 Depth: (cm) 0.3 Area: (cm) 1.602 Volume: (cm) 0.481 % Reduction in Area: 0% % Reduction in Volume: 0% Epithelialization: None Tunneling: No Undermining: Yes Starting Position (o'clock): 7 Ending Position  (o'clock): 10 Maximum Distance: (cm) 0.3 Wound Description Classification: Grade 1 Wound Margin: Well defined, not attached Exudate Amount: Medium Exudate Type: Serosanguineous Exudate Color: red, brown Foul Odor After Cleansing: No Slough/Fibrino No Wound Bed Granulation Amount: Large (67-100%) Exposed Structure Granulation Quality: Red, Hyper-granulation Fascia Exposed: No Necrotic Amount: None Present (0%) Fat Layer (Subcutaneous Tissue) Exposed: Yes Tendon Exposed: No Muscle Exposed: No Joint Exposed: No Bone Exposed: No Assessment Notes Calloused Periwound Treatment Notes Wound #1 (Foot) Wound Laterality: Plantar, Left Cleanser Soap and Water Discharge Instruction: May shower and wash wound with dial antibacterial soap and water prior to dressing change. Peri-Wound Care Topical Primary Dressing Regranex Secondary Dressing Bordered Gauze, 2x3.75 in Discharge Instruction: Apply over primary dressing as directed. Woven Gauze Sponges 2x2 in Discharge Instruction: Apply over primary dressing as directed. Secured With Compression Wrap Compression Stockings Environmental education officer) Signed: 12/26/2021 4:16:13 PM By: Lorrin Jackson Entered By: Lorrin Jackson on 12/26/2021 13:43:48 -------------------------------------------------------------------------------- Vitals Details Patient Name: Date of Service: [name], Tyrone [name] 12/26/2021 1:15 PM Medical Record Number: [id number] Patient Account Number: [id number] Date of Birth/Sex: [name]: March 31, 1941 (81 y.o. [name] Primary Care Kelsa Jaworowski: Dimas Chyle Other Clinician: Referring Claudia Alvizo: Tyrone Fionna Merriott/Extender: Larwance Sachs in Treatment: 0 Vital Signs Time Taken: 13:24 Temperature (F): 97.9 Height (in): 72 Pulse (bpm): 56 Source: Stated Respiratory Rate (breaths/min): 18 Weight (lbs): 198 Blood Pressure (mmHg): 146/79 Source: Stated Capillary Blood Glucose  (mg/dl): 109 Body Mass Index (BMI): 26.9 Reference Range: 80 - 120 mg / dl Electronic Signature(s) Signed: 12/26/2021 4:16:13 PM By: Lorrin Jackson Entered By: Lorrin Jackson on 12/26/2021 13:25:32

## 2021-12-26 ENCOUNTER — Encounter (HOSPITAL_BASED_OUTPATIENT_CLINIC_OR_DEPARTMENT_OTHER): Payer: Medicare Other | Attending: Internal Medicine | Admitting: Internal Medicine

## 2021-12-26 DIAGNOSIS — Z7901 Long term (current) use of anticoagulants: Secondary | ICD-10-CM | POA: Insufficient documentation

## 2021-12-26 DIAGNOSIS — E11621 Type 2 diabetes mellitus with foot ulcer: Secondary | ICD-10-CM | POA: Insufficient documentation

## 2021-12-26 DIAGNOSIS — E1142 Type 2 diabetes mellitus with diabetic polyneuropathy: Secondary | ICD-10-CM | POA: Insufficient documentation

## 2021-12-26 DIAGNOSIS — L97522 Non-pressure chronic ulcer of other part of left foot with fat layer exposed: Secondary | ICD-10-CM | POA: Insufficient documentation

## 2021-12-26 DIAGNOSIS — I480 Paroxysmal atrial fibrillation: Secondary | ICD-10-CM | POA: Insufficient documentation

## 2021-12-26 DIAGNOSIS — Z89511 Acquired absence of right leg below knee: Secondary | ICD-10-CM | POA: Diagnosis not present

## 2021-12-26 DIAGNOSIS — I5022 Chronic systolic (congestive) heart failure: Secondary | ICD-10-CM | POA: Diagnosis not present

## 2021-12-26 NOTE — Progress Notes (Signed)
SOLLY, DERASMO ([id number]) Visit Report for 12/26/2021 Chief Complaint Document Details Patient Name: Date of Service: [name] 12/26/2021 1:15 PM Medical Record Number: [id number] Patient Account Number: [id number] Date of Birth/Sex: Treating RN: [birth year] (81 y.o. [name] Primary Care Provider: Dimas Chyle Other Clinician: Referring Provider: Treating Provider/Extender: Larwance Sachs in Treatment: 0 Information Obtained from: Patient Chief Complaint 12/26/2021; left foot plantar foot wound Electronic Signature(s) Signed: 12/26/2021 3:04:33 PM By: Kalman Shan DO Entered By: Kalman Shan on 12/26/2021 14:09:54 -------------------------------------------------------------------------------- HPI Details Patient Name: Date of Service: [name], [name] 12/26/2021 1:15 PM Medical Record Number: [id number] Patient Account Number: [id number] Date of Birth/Sex: Treating RN: 01/24/1941 (81 y.o. [name] Primary Care Provider: Dimas Chyle Other Clinician: Referring Provider: Treating Provider/Extender: Larwance Sachs in Treatment: 0 History of Present Illness HPI Description: 12/26/2021 Mr. Demontae Antunes is an 81 year old male with a past medical history of controlled type 2 diabetes, A-fib on Eliquis, chronic systolic heart failure on Entresto and right below knee amputation (2017) that presents to the clinic for a left plantar foot wound. He states this has been present for 1 year and is not sure how it started. He has peripheral neuropathy. He follows with Dr. Sherryle Lis, podiatry for this issue. He had an MRI on 10/16/2021 that did not show evidence of osteomyelitis. He has tried collagen with some benefit. Currently patient is keeping the area covered with a bandaid and no dressing. Due to his right BKA he has issues with balance and uses a cane. He is not a good candidate for the total contact cast. Surgical  exostectomy has been thought of for offloading however risk and recovery is high. Patient currently denies signs of infection. Electronic Signature(s) Signed: 12/26/2021 3:04:33 PM By: Kalman Shan DO Entered By: Kalman Shan on 12/26/2021 14:35:30 -------------------------------------------------------------------------------- Physical Exam Details Patient Name: Date of Service: [name], [name] 12/26/2021 1:15 PM Medical Record Number: [id number] Patient Account Number: [id number] Date of Birth/Sex: Treating RN: 1940-10-29 (81 y.o. [name] Primary Care Provider: Other Clinician: Dimas Chyle Referring Provider: Treating Provider/Extender: Larwance Sachs in Treatment: 0 Constitutional respirations regular, non-labored and within target range for patient.. Cardiovascular 2+ dorsalis pedis/posterior tibialis pulses. Psychiatric pleasant and cooperative. Notes Left foot: T the plantar medial aspect there is an open wound with granulation tissue throughout. Minimal undermining from the 1 to 4 o'clock position. No signs o of surrounding infection. Electronic Signature(s) Signed: 12/26/2021 3:04:33 PM By: Kalman Shan DO Entered By: Kalman Shan on 12/26/2021 14:36:09 -------------------------------------------------------------------------------- Physician Orders Details Patient Name: Date of Service: [name], [name] 12/26/2021 1:15 PM Medical Record Number: [id number] Patient Account Number: [id number] Date of Birth/Sex: Treating RN: 1941/05/21 (81 y.o. [name] Primary Care Provider: Dimas Chyle Other Clinician: Referring Provider: Treating Provider/Extender: Larwance Sachs in Treatment: 0 Verbal / Phone Orders: No Diagnosis Coding ICD-10 Coding Code Description E11.621 Type 2 diabetes mellitus with foot ulcer L97.522 Non-pressure chronic ulcer of other part of left foot with fat layer  exposed E11.42 Type 2 diabetes mellitus with diabetic polyneuropathy C16.60 Chronic systolic (congestive) heart failure I48.0 Paroxysmal atrial fibrillation Z89.511 Acquired absence of right leg below knee Follow-up Appointments ppointment in 2 weeks. - 01/09/22 @ 12:30 pm with Dr. Heber Terramuggus and Leveda Anna, RN (Room 7) Return A Other: - Call Dr. Sherryle Lis at Podiatry to see about diabetic shoe or inserts. Bathing/  Shower/ Hygiene May shower and wash wound with soap and water. Edema Control - Lymphedema / SCD / Other Elevate legs to the level of the heart or above for 30 minutes daily and/or when sitting, a frequency of: - several times a day Avoid standing for long periods of time. Patient to wear own compression stockings every day. Additional Orders / Instructions Follow Nutritious Diet - Monitor/Control Blood Sugars Wound Treatment Wound #1 - Foot Wound Laterality: Plantar, Left Cleanser: Soap and Water 1 x Per Day/30 Days Discharge Instructions: May shower and wash wound with dial antibacterial soap and water prior to dressing change. Prim Dressing: Regranex ary 1 x Per Day/30 Days Secondary Dressing: Bordered Gauze, 2x3.75 in 1 x Per Day/30 Days Discharge Instructions: Apply over primary dressing as directed. Secondary Dressing: Woven Gauze Sponges 2x2 in 1 x Per Day/30 Days Discharge Instructions: Apply over primary dressing as directed. Services and Therapies nkle Brachial Index/ ABI and TBI's - ABI/TBI's for left leg. Chronic, non healing wound left foot. CPT - (ICD10 [id number] - Non-pressure chronic ulcer : A of other part of left foot with fat layer exposed) Patient Medications llergies: lisinopril A Notifications Medication Indication Start End 12/26/2021 Regranex DOSE 1 - topical 0.01 % gel - Apply once daily to the wound bed Electronic Signature(s) Signed: 12/26/2021 3:04:33 PM By: Kalman Shan DO Previous Signature: 12/26/2021 2:20:17 PM Version By: Kalman Shan  DO Entered By: Kalman Shan on 12/26/2021 14:36:18 Prescription 12/26/2021 -------------------------------------------------------------------------------- Jamal Collin E. Kalman Shan DO Patient Name: Provider: 08-03-1940 [id number] Date of Birth: NPI#: Jerilynn Mages [id number] Sex: DEA #: [phone] Phone #: License #: [place] Patient Address: [address] [place], [place] [address] [place], [place] [address] [phone] Allergies lisinopril Provider's Orders nkle Brachial Index/ ABI and TBI's - ICD10: L97.522 - ABI/TBI's for left leg. Chronic, non healing wound left foot. CPT: A Hand Signature: Date(s): Electronic Signature(s) Signed: 12/26/2021 3:04:33 PM By: Kalman Shan DO Entered By: Kalman Shan on 12/26/2021 14:36:18 -------------------------------------------------------------------------------- Problem List Details Patient Name: Date of Service: [name], [name] 12/26/2021 1:15 PM Medical Record Number: [id number] Patient Account Number: [id number] Date of Birth/Sex: Treating RN: Apr 17, 1941 (81 y.o. [name] Primary Care Provider: Dimas Chyle Other Clinician: Referring Provider: Treating Provider/Extender: Larwance Sachs in Treatment: 0 Active Problems ICD-10 Encounter Code Description Active Date MDM Diagnosis E11.621 Type 2 diabetes mellitus with foot ulcer 12/26/2021 No Yes L97.522 Non-pressure chronic ulcer of other part of left foot with fat layer exposed 12/26/2021 No Yes E11.42 Type 2 diabetes mellitus with diabetic polyneuropathy 12/26/2021 No Yes G66.59 Chronic systolic (congestive) heart failure 12/26/2021 No Yes I48.0 Paroxysmal atrial fibrillation 12/26/2021 No Yes Z89.511 Acquired absence of right leg below knee 12/26/2021 No Yes Inactive Problems Resolved Problems Electronic Signature(s) Signed: 12/26/2021 3:04:33 PM By: Kalman Shan  DO Entered By: Kalman Shan on 12/26/2021 14:08:34 -------------------------------------------------------------------------------- Progress Note Details Patient Name: Date of Service: [name], [name] 12/26/2021 1:15 PM Medical Record Number: [id number] Patient Account Number: [id number] Date of Birth/Sex: Treating RN: [birth year] (81 y.o. [name] Primary Care Provider: Dimas Chyle Other Clinician: Referring Provider: Treating Provider/Extender: Larwance Sachs in Treatment: 0 Subjective Chief Complaint Information obtained from Patient 12/26/2021; left foot plantar foot wound History of Present Illness (HPI) 12/26/2021 Mr. Adonay Scheier is an 81 year old male with a past medical history of controlled type 2 diabetes, A-fib on Eliquis,  chronic systolic heart failure on Entresto and right below knee amputation (2017) that presents to the clinic for a left plantar foot wound. He states this has been present for 1 year and is not sure how it started. He has peripheral neuropathy. He follows with Dr. Sherryle Lis, podiatry for this issue. He had an MRI on 10/16/2021 that did not show evidence of osteomyelitis. He has tried collagen with some benefit. Currently patient is keeping the area covered with a bandaid and no dressing. Due to his right BKA he has issues with balance and uses a cane. He is not a good candidate for the total contact cast. Surgical exostectomy has been thought of for offloading however risk and recovery is high. Patient currently denies signs of infection. Patient History Information obtained from Patient, Chart. Allergies lisinopril Family History Heart Disease - Siblings, Hypertension - Siblings, Stroke - Father, No family history of Cancer, Diabetes, Hereditary Spherocytosis, Kidney Disease, Lung Disease, Seizures, Thyroid Problems, Tuberculosis. Social History Former smoker - Quit in 1983, Marital Status - Widowed, Alcohol Use - Rarely,  Drug Use - No History, Caffeine Use - Rarely. Medical History Cardiovascular Patient has history of Arrhythmia, Congestive Heart Failure, Coronary Artery Disease Endocrine Patient has history of Type II Diabetes Musculoskeletal Patient has history of Osteoarthritis Neurologic Patient has history of Neuropathy Patient is treated with Oral Agents. Medical A Surgical History Notes nd Genitourinary Hx BPH Oncologic Basal Cell Carcinoma (Face) Review of Systems (ROS) Eyes Complains or has symptoms of Glasses / Contacts. Ear/Nose/Mouth/Throat Complains or has symptoms of Chronic sinus problems or rhinitis. Respiratory Denies complaints or symptoms of Chronic or frequent coughs, Shortness of Breath. Gastrointestinal Denies complaints or symptoms of Frequent diarrhea, Nausea, Vomiting. Integumentary (Skin) Complains or has symptoms of Wounds - Left Foot. Psychiatric Denies complaints or symptoms of Claustrophobia, Suicidal. Objective Constitutional respirations regular, non-labored and within target range for patient.. Vitals Time Taken: 1:24 PM, Height: 72 in, Source: Stated, Weight: 198 lbs, Source: Stated, BMI: 26.9, Temperature: 97.9 F, Pulse: 56 bpm, Respiratory Rate: 18 breaths/min, Blood Pressure: 146/79 mmHg, Capillary Blood Glucose: 109 mg/dl. Cardiovascular 2+ dorsalis pedis/posterior tibialis pulses. Psychiatric pleasant and cooperative. General Notes: Left foot: T the plantar medial aspect there is an open wound with granulation tissue throughout. Minimal undermining from the 1 to 4 o'clock o position. No signs of surrounding infection. Integumentary (Hair, Skin) Wound #1 status is Open. Original cause of wound was Gradually Appeared. The date acquired was: 01/24/2021. The wound is located on the [place]. The wound measures 1.2cm length x 1.7cm width x 0.3cm depth; 1.602cm^2 area and 0.481cm^3 volume. There is Fat Layer (Subcutaneous Tissue) exposed. There  is no tunneling noted, however, there is undermining starting at 7:00 and ending at 10:00 with a maximum distance of 0.3cm. There is a medium amount of serosanguineous drainage noted. The wound margin is well defined and not attached to the wound base. There is large (67-100%) red, hyper - granulation within the wound bed. There is no necrotic tissue within the wound bed. General Notes: Calloused Periwound Assessment Active Problems ICD-10 Type 2 diabetes mellitus with foot ulcer Non-pressure chronic ulcer of other part of left foot with fat layer exposed Type 2 diabetes mellitus with diabetic polyneuropathy Chronic systolic (congestive) heart failure Paroxysmal atrial fibrillation Acquired absence of right leg below knee Patient presents with a 1 year history of nonhealing ulcer to the left foot in the setting of diabetes with peripheral neuropathy. Unfortunately he has a right BKA and has  issues with balance and would not be a candidate for a total contact cast. If he cannot offload the wound he has a very low chance of healing of this wound. With an open wound he is also at high risk for infection and thus high risk for amputation. He expressed understanding. At this time I recommended trying regranix. I asked him to discuss orthotics with his podiatrist. His ABIs in office were noncompressible and we will order formal ABIs with TBI's. Follow-up in 2 weeks. Plan Follow-up Appointments: Return Appointment in 2 weeks. - 01/09/22 @ 12:30 pm with Dr. Heber Carbon and Leveda Anna, RN (Room 7) Other: - Call Dr. Sherryle Lis at Podiatry to see about diabetic shoe or inserts. Bathing/ Shower/ Hygiene: May shower and wash wound with soap and water. Edema Control - Lymphedema / SCD / Other: Elevate legs to the level of the heart or above for 30 minutes daily and/or when sitting, a frequency of: - several times a day Avoid standing for long periods of time. Patient to wear own compression stockings every  day. Additional Orders / Instructions: Follow Nutritious Diet - Monitor/Control Blood Sugars Services and Therapies ordered were: Ankle Brachial Index/ ABI and TBI's - ABI/TBI's for left leg. Chronic, non healing wound left foot. CPT: The following medication(s) was prescribed: Regranex topical 0.01 % gel 1 Apply once daily to the wound bed starting 12/26/2021 WOUND #1: - Foot Wound Laterality: Plantar, Left Cleanser: Soap and Water 1 x Per Day/30 Days Discharge Instructions: May shower and wash wound with dial antibacterial soap and water prior to dressing change. Prim Dressing: Regranex 1 x Per Day/30 Days ary Secondary Dressing: Bordered Gauze, 2x3.75 in 1 x Per Day/30 Days Discharge Instructions: Apply over primary dressing as directed. Secondary Dressing: Woven Gauze Sponges 2x2 in 1 x Per Day/30 Days Discharge Instructions: Apply over primary dressing as directed. 1. Offloadingooorthotics 2. Regranex 3. Follow-up in 2 weeks Electronic Signature(s) Signed: 12/26/2021 3:04:33 PM By: Kalman Shan DO Entered By: Kalman Shan on 12/26/2021 14:39:48 -------------------------------------------------------------------------------- HxROS Details Patient Name: Date of Service: [name], [name] 12/26/2021 1:15 PM Medical Record Number: [id number] Patient Account Number: [id number] Date of Birth/Sex: Treating RN: [birth year] (81 y.o. [name] Primary Care Provider: Dimas Chyle Other Clinician: Referring Provider: Treating Provider/Extender: Larwance Sachs in Treatment: 0 Information Obtained From Patient Chart Eyes Complaints and Symptoms: Positive for: Glasses / Contacts Ear/Nose/Mouth/Throat Complaints and Symptoms: Positive for: Chronic sinus problems or rhinitis Respiratory Complaints and Symptoms: Negative for: Chronic or frequent coughs; Shortness of Breath Gastrointestinal Complaints and Symptoms: Negative for: Frequent diarrhea;  Nausea; Vomiting Integumentary (Skin) Complaints and Symptoms: Positive for: Wounds - Left Foot Psychiatric Complaints and Symptoms: Negative for: Claustrophobia; Suicidal Hematologic/Lymphatic Cardiovascular Medical History: Positive for: Arrhythmia; Congestive Heart Failure; Coronary Artery Disease Endocrine Medical History: Positive for: Type II Diabetes Treated with: Oral agents Genitourinary Medical History: Past Medical History Notes: Hx BPH Immunological Musculoskeletal Medical History: Positive for: Osteoarthritis Neurologic Medical History: Positive for: Neuropathy Oncologic Medical History: Past Medical History Notes: Basal Cell Carcinoma (Face) Immunizations Pneumococcal Vaccine: Received Pneumococcal Vaccination: Yes Received Pneumococcal Vaccination On or After 60th Birthday: Yes Implantable Devices None Family and Social History Cancer: No; Diabetes: No; Heart Disease: Yes - Siblings; Hereditary Spherocytosis: No; Hypertension: Yes - Siblings; Kidney Disease: No; Lung Disease: No; Seizures: No; Stroke: Yes - Father; Thyroid Problems: No; Tuberculosis: No; Former smoker - Quit in 1983; Marital Status - Widowed; Alcohol Use: Rarely; Drug Use: No History; Caffeine Use:  Rarely; Financial Concerns: No; Food, Clothing or Shelter Needs: No; Support System Lacking: No; Transportation Concerns: No Electronic Signature(s) Signed: 12/26/2021 3:04:33 PM By: Kalman Shan DO Signed: 12/26/2021 4:16:13 PM By: Lorrin Jackson Previous Signature: 12/26/2021 8:57:46 AM Version By: Kalman Shan DO Entered By: Lorrin Jackson on 12/26/2021 13:28:16 -------------------------------------------------------------------------------- Leonore Details Patient Name: Date of Service: [name], [name] 12/26/2021 Medical Record Number: [id number] Patient Account Number: [id number] Date of Birth/Sex: Treating RN: Apr 04, 1941 (81 y.o. [name] Primary Care Provider: Dimas Chyle Other Clinician: Referring Provider: Treating Provider/Extender: Larwance Sachs in Treatment: 0 Diagnosis Coding ICD-10 Codes Code Description [phone] Type 2 diabetes mellitus with foot ulcer L97.522 Non-pressure chronic ulcer of other part of left foot with fat layer exposed E11.42 Type 2 diabetes mellitus with diabetic polyneuropathy A19.37 Chronic systolic (congestive) heart failure I48.0 Paroxysmal atrial fibrillation Z89.511 Acquired absence of right leg below knee Facility Procedures CPT4 Code: [id number] Description: 99214 - WOUND CARE VISIT-LEV 4 EST PT Modifier: 25 Quantity: 1 Physician Procedures : CPT4 Code Description Modifier [id number] - WC PHYS LEVEL 4 - NEW PT ICD-10 Diagnosis Description E11.621 Type 2 diabetes mellitus with foot ulcer L97.522 Non-pressure chronic ulcer of other part of left foot with fat layer exposed E11.42 Type 2  diabetes mellitus with diabetic polyneuropathy Z89.511 Acquired absence of right leg below knee Quantity: 1 Electronic Signature(s) Signed: 12/26/2021 3:04:33 PM By: Kalman Shan DO Entered By: Kalman Shan on 12/26/2021 14:40:05

## 2021-12-26 NOTE — Progress Notes (Signed)
DONTREY, SNELLGROVE ([id number]) Visit Report for 12/26/2021 Abuse Risk Screen Details Patient Name: Date of Service: [name] 12/26/2021 1:15 PM Medical Record Number: [id number] Patient Account Number: [id number] Date of Birth/Sex: Treating RN: 1941/01/07 (81 y.o. [name] Primary Care Caysie Minnifield: Dimas Chyle Other Clinician: Referring Sabian Kuba: Treating Sasan Wilkie/Extender: Larwance Sachs in Treatment: 0 Abuse Risk Screen Items Answer ABUSE RISK SCREEN: Has anyone close to you tried to hurt or harm you recentlyo No Do you feel uncomfortable with anyone in your familyo No Has anyone forced you do things that you didnt want to doo No Electronic Signature(s) Signed: 12/26/2021 4:16:13 PM By: Lorrin Jackson Entered By: Lorrin Jackson on 12/26/2021 13:28:21 -------------------------------------------------------------------------------- Activities of Daily Living Details Patient Name: Date of Service: [name] 12/26/2021 1:15 PM Medical Record Number: [id number] Patient Account Number: [id number] Date of Birth/Sex: Treating RN: 11-May-1941 (81 y.o. [name] Primary Care Ariely Riddell: Dimas Chyle Other Clinician: Referring Jowel Waltner: Treating Eryca Bolte/Extender: Larwance Sachs in Treatment: 0 Activities of Daily Living Items Answer Activities of Daily Living (Please select one for each item) Drive Automobile Completely Able T Medications ake Completely Able Use T elephone Completely Able Care for Appearance Completely Able Use T oilet Completely Able Bath / Shower Completely Able Dress Self Completely Able Feed Self Completely Able Walk Completely Able Get In / Out Bed Completely Able Housework Completely Able Prepare Meals Completely [place] for Self Completely Able Electronic Signature(s) Signed: 12/26/2021 4:16:13 PM By: Lorrin Jackson Entered By: Lorrin Jackson on 12/26/2021  13:28:50 -------------------------------------------------------------------------------- Education Screening Details Patient Name: Date of Service: [name], [name] 12/26/2021 1:15 PM Medical Record Number: [id number] Patient Account Number: [id number] Date of Birth/Sex: Treating RN: 31-Aug-1940 (81 y.o. [name] Primary Care Adanna Zuckerman: Dimas Chyle Other Clinician: Referring Cono Gebhard: Treating Adlynn Lowenstein/Extender: Larwance Sachs in Treatment: 0 Primary Learner Assessed: Patient Learning Preferences/Education Level/Primary Language Learning Preference: Explanation, Demonstration, Printed Material Highest Education Level: College or Above Preferred Language: English Cognitive Barrier Language Barrier: No Translator Needed: No Memory Deficit: No Emotional Barrier: No Cultural/Religious Beliefs Affecting Medical Care: No Physical Barrier Impaired Vision: Yes Glasses Impaired Hearing: Yes Hard of Hearing Decreased Hand dexterity: No Knowledge/Comprehension Knowledge Level: High Comprehension Level: High Ability to understand written instructions: High Ability to understand verbal instructions: High Motivation Anxiety Level: Anxious Cooperation: Cooperative Education Importance: Acknowledges Need Interest in Health Problems: Asks Questions Perception: Coherent Willingness to Engage in Self-Management High Activities: Readiness to Engage in Self-Management High Activities: Electronic Signature(s) Signed: 12/26/2021 4:16:13 PM By: Lorrin Jackson Entered By: Lorrin Jackson on 12/26/2021 13:29:36 -------------------------------------------------------------------------------- Fall Risk Assessment Details Patient Name: Date of Service: Tilman Neat D, [name] 12/26/2021 1:15 PM Medical Record Number: [id number] Patient Account Number: [id number] Date of Birth/Sex: Treating RN: 1940-12-09 (81 y.o. [name] Primary Care Ethan Kasperski: Dimas Chyle Other Clinician: Referring Sanjuanita Condrey: Treating Aleatha Taite/Extender: Larwance Sachs in Treatment: 0 Fall Risk Assessment Items Have you had 2 or more falls in the last 12 monthso 0 No Have you had any fall that resulted in injury in the last 12 monthso 0 No FALLS RISK SCREEN History of falling - immediate or within 3 months 0 No Secondary diagnosis (Do you have 2 or more medical diagnoseso) 15 Yes Ambulatory aid None/bed rest/wheelchair/nurse 0 No Crutches/cane/walker 15 Yes Furniture 0 No Intravenous therapy Access/Saline/Heparin Lock 0 No Gait/Transferring Normal/ bed rest/  wheelchair 0 Yes Weak (short steps with or without shuffle, stooped but able to lift head while walking, may seek 0 No support from furniture) Impaired (short steps with shuffle, may have difficulty arising from chair, head down, impaired 0 No balance) Mental Status Oriented to own ability 0 Yes Electronic Signature(s) Signed: 12/26/2021 4:16:13 PM By: Lorrin Jackson Entered By: Lorrin Jackson on 12/26/2021 13:29:56 -------------------------------------------------------------------------------- Foot Assessment Details Patient Name: Date of Service: [name], [name] 12/26/2021 1:15 PM Medical Record Number: [id number] Patient Account Number: [id number] Date of Birth/Sex: Treating RN: 10/25/1940 (81 y.o. 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 12/26/2021 1:15 PM Medical Record Number: [id number] Patient Account Number: [id number] Date of Birth/Sex: Treating RN: Jun 28, 1940 (81 y.o. [name] Primary Care Raelie Lohr: Dimas Chyle Other Clinician: Referring Missey Hasley: Treating Alaina Donati/Extender: Larwance Sachs in Treatment: 0 Height (in): 72 Weight (lbs): 198 Body Mass Index (BMI): 26.9 Nutrition Risk Screening Items Score Screening NUTRITION RISK SCREEN: I have an illness or condition that made me change the kind and/or amount of food I eat 0 No I eat fewer than two meals per day 0 No I eat few fruits and vegetables, or milk products 0 No I have three or more drinks of beer, liquor or wine almost every day 0 No I have tooth or mouth problems that make it hard for me to eat 0 No I don't always have enough money to buy the food I need 0 No I eat alone most of the time 0 No I take three or more different prescribed or over-the-counter drugs a day 1 Yes Without wanting to, I have lost or gained 10 pounds in the last six months 0 No I am not always physically able to shop, cook and/or feed myself 0 No Nutrition Protocols Good Risk Protocol 0 No interventions needed Moderate Risk Protocol High Risk Proctocol Risk Level: Good Risk Score: 1 Electronic Signature(s) Signed: 12/26/2021 4:16:13 PM By: Lorrin Jackson Entered By: Lorrin Jackson on 12/26/2021 13:30:04

## 2022-01-08 ENCOUNTER — Other Ambulatory Visit: Payer: Self-pay | Admitting: Family Medicine

## 2022-01-08 DIAGNOSIS — L309 Dermatitis, unspecified: Secondary | ICD-10-CM

## 2022-01-09 ENCOUNTER — Encounter (HOSPITAL_BASED_OUTPATIENT_CLINIC_OR_DEPARTMENT_OTHER): Payer: Medicare Other | Admitting: Internal Medicine

## 2022-01-09 DIAGNOSIS — E1142 Type 2 diabetes mellitus with diabetic polyneuropathy: Secondary | ICD-10-CM

## 2022-01-09 DIAGNOSIS — E11621 Type 2 diabetes mellitus with foot ulcer: Secondary | ICD-10-CM

## 2022-01-09 DIAGNOSIS — I5022 Chronic systolic (congestive) heart failure: Secondary | ICD-10-CM | POA: Diagnosis not present

## 2022-01-09 DIAGNOSIS — Z89511 Acquired absence of right leg below knee: Secondary | ICD-10-CM

## 2022-01-09 DIAGNOSIS — L97522 Non-pressure chronic ulcer of other part of left foot with fat layer exposed: Secondary | ICD-10-CM

## 2022-01-09 DIAGNOSIS — I480 Paroxysmal atrial fibrillation: Secondary | ICD-10-CM | POA: Diagnosis not present

## 2022-01-10 NOTE — Progress Notes (Signed)
AXCEL, HORSCH ([id number]) Visit Report for 01/09/2022 Arrival Information Details Patient Name: Date of Service: [name] 01/09/2022 12:30 PM Medical Record Number: [id number] Patient Account Number: [id number] Date of Birth/Sex: Treating RN: 1941/01/31 (81 y.o. [name]: [name] Other Clinician: Referring [name]: Treating [name]/Extender: [name] in Treatment: 2 Visit Information History Since Last Visit Added or deleted any medications: No Patient Arrived: [name] Any new allergies or adverse reactions: No Arrival Time: 12:35 Had a fall or experienced change in No Transfer Assistance: None activities of daily living that may affect Patient Identification Verified: Yes risk of falls: Secondary Verification Process Completed: Yes Signs or symptoms of abuse/neglect since last visito No Patient Requires Transmission-Based Precautions: No Hospitalized since last visit: No Patient Has Alerts: Yes Implantable device outside of the clinic excluding No Patient Alerts: Patient on Blood Thinner cellular tissue based products placed in the center ABI: R=Non Comp since last visit: Has Dressing in Place as Prescribed: Yes Pain Present Now: No Electronic Signature(s) Signed: 01/09/2022 5:00:27 PM By: Lorrin Jackson Entered By: Lorrin Jackson on 01/09/2022 12:37:40 -------------------------------------------------------------------------------- Clinic Level of Care Assessment Details Patient Name: Date of Service: Tyrone Mccallum E. 01/09/2022 12:30 PM Medical Record Number: [id number] Patient Account Number: [id number] Date of Birth/Sex: Treating RN: May 03, 1941 (81 y.o. [name] Primary Care Kijuana Ruppel: [name] Other Clinician: Referring Nakima Fluegge: Treating [name]/Extender: [name] in Treatment: 2 Clinic Level of Care Assessment Items TOOL 4 Quantity Score X- 1  0 Use when only an EandM is performed on FOLLOW-UP visit ASSESSMENTS - Nursing Assessment / Reassessment X- 1 10 Reassessment of Co-morbidities (includes updates in patient status) X- 1 5 Reassessment of Adherence to Treatment Plan ASSESSMENTS - Wound and Skin A ssessment / Reassessment X - Simple Wound Assessment / Reassessment - one wound 1 5 '[]'$  - 0 Complex Wound Assessment / Reassessment - multiple wounds '[]'$  - 0 Dermatologic / Skin Assessment (not related to wound area) ASSESSMENTS - Focused Assessment '[]'$  - 0 Circumferential Edema Measurements - multi extremities '[]'$  - 0 Nutritional Assessment / Counseling / Intervention '[]'$  - 0 Lower Extremity Assessment (monofilament, tuning fork, pulses) '[]'$  - 0 Peripheral Arterial Disease Assessment (using hand held doppler) ASSESSMENTS - Ostomy and/or Continence Assessment and Care '[]'$  - 0 Incontinence Assessment and Management '[]'$  - 0 Ostomy Care Assessment and Management (repouching, etc.) PROCESS - Coordination of Care '[]'$  - 0 Simple Patient / Family Education for ongoing care X- 1 20 Complex (extensive) Patient / Family Education for ongoing care X- 1 10 Staff obtains Programmer, systems, Records, T Results / Process Orders est '[]'$  - 0 Staff telephones HHA, Nursing Homes / Clarify orders / etc '[]'$  - 0 Routine Transfer to another Facility (non-emergent condition) '[]'$  - 0 Routine Hospital Admission (non-emergent condition) '[]'$  - 0 New Admissions / Biomedical engineer / Ordering NPWT Apligraf, etc. , '[]'$  - 0 Emergency Hospital Admission (emergent condition) '[]'$  - 0 Simple Discharge Coordination '[]'$  - 0 Complex (extensive) Discharge Coordination PROCESS - Special Needs '[]'$  - 0 Pediatric / Minor Patient Management '[]'$  - 0 Isolation Patient Management '[]'$  - 0 Hearing / Language / Visual special needs '[]'$  - 0 Assessment of Community assistance (transportation, D/C planning, etc.) '[]'$  - 0 Additional assistance / Altered mentation '[]'$  -  0 Support Surface(s) Assessment (bed, cushion, seat, etc.) INTERVENTIONS - Wound Cleansing / Measurement X - Simple Wound Cleansing - one wound 1 5 '[]'$  -  0 Complex Wound Cleansing - multiple wounds X- 1 5 Wound Imaging (photographs - any number of wounds) '[]'$  - 0 Wound Tracing (instead of photographs) X- 1 5 Simple Wound Measurement - one wound '[]'$  - 0 Complex Wound Measurement - multiple wounds INTERVENTIONS - Wound Dressings X - Small Wound Dressing one or multiple wounds 1 10 '[]'$  - 0 Medium Wound Dressing one or multiple wounds '[]'$  - 0 Large Wound Dressing one or multiple wounds '[]'$  - 0 Application of Medications - topical '[]'$  - 0 Application of Medications - injection INTERVENTIONS - Miscellaneous '[]'$  - 0 External ear exam '[]'$  - 0 Specimen Collection (cultures, biopsies, blood, body fluids, etc.) '[]'$  - 0 Specimen(s) / Culture(s) sent or taken to Lab for analysis '[]'$  - 0 Patient Transfer (multiple staff / Civil Service fast streamer / Similar devices) '[]'$  - 0 Simple Staple / Suture removal (25 or less) '[]'$  - 0 Complex Staple / Suture removal (26 or more) '[]'$  - 0 Hypo / Hyperglycemic Management (close monitor of Blood Glucose) '[]'$  - 0 Ankle / Brachial Index (ABI) - do not check if billed separately X- 1 5 Vital Signs Has the patient been seen at the hospital within the last three years: Yes Total Score: 80 Level Of Care: New/Established - Level 3 Electronic Signature(s) Signed: 01/09/2022 5:00:27 PM By: Lorrin Jackson Entered By: Lorrin Jackson on 01/09/2022 13:00:58 -------------------------------------------------------------------------------- Encounter Discharge Information Details Patient Name: Date of Service: [name], Tyrone [name] 01/09/2022 12:30 PM Medical Record Number: [id number] Patient Account Number: [id number] Date of Birth/Sex: Treating RN: 03-07-1941 (81 y.o. [name] Primary Care Dwane Andres: [name] Other Clinician: Referring Graydon Fofana: Treating Rayah Fines/Extender:  [name] in Treatment: 2 Encounter Discharge Information Items Discharge Condition: Stable Ambulatory Status: Cane Discharge Destination: Home Transportation: Private Auto Schedule Follow-up Appointment: Yes Clinical Summary of Care: Provided on 01/09/2022 Form Type Recipient Paper Patient Patient Electronic Signature(s) Signed: 01/09/2022 5:00:27 PM By: Lorrin Jackson Entered By: Lorrin Jackson on 01/09/2022 13:06:30 -------------------------------------------------------------------------------- Lower Extremity Assessment Details Patient Name: Date of Service: Tyrone Beck [name] 01/09/2022 12:30 PM Medical Record Number: [id number] Patient Account Number: [id number] Date of Birth/Sex: Treating RN: [birth year] (81 y.o. [name] Primary Care Chasty Randal: [name] Other Clinician: Referring Sedrick Tober: Treating Namita Yearwood/Extender: Laurena Slimmer Weeks in Treatment: 2 Edema Assessment Assessed: [Left: Yes] [Right: No] Edema: [Left: Ye] [Right: s] Calf Left: Right: Point of Measurement: 31 cm From Medial Instep 34.8 cm Ankle Left: Right: Point of Measurement: 9 cm From Medial Instep 29 cm Vascular Assessment Pulses: Dorsalis Pedis Palpable: [Left:Yes] Electronic Signature(s) Signed: 01/09/2022 5:00:27 PM By: Lorrin Jackson Entered By: Lorrin Jackson on 01/09/2022 12:45:27 -------------------------------------------------------------------------------- Multi Wound Chart Details Patient Name: Date of Service: [name], Tyrone [name] 01/09/2022 12:30 PM Medical Record Number: [id number] Patient Account Number: [id number] Date of Birth/Sex: Treating RN: 23-Oct-1940 (81 y.o. [name] Primary Care Burnis Kaser: [name] Other Clinician: Referring Tyhesha Dutson: Treating Juriel Cid/Extender: Laurena Slimmer Weeks in Treatment: 2 Vital Signs Height(in): 72 Capillary Blood Glucose(mg/dl): 98 Weight(lbs):  198 Pulse(bpm): 39 Body Mass Index(BMI): 26.9 Blood Pressure(mmHg): 110/68 Temperature(F): 97.7 Respiratory Rate(breaths/min): 18 Photos: [N/A:N/A] Left, Plantar Foot N/A N/A Wound Location: Gradually Appeared N/A N/A Wounding Event: Diabetic Wound/Ulcer of the Lower N/A N/A Primary Etiology: Extremity Arrhythmia, Congestive Heart Failure, N/A N/A Comorbid History: Coronary Artery Disease, Type II Diabetes, Osteoarthritis, Neuropathy 01/24/2021 N/A N/A Date Acquired: 2 N/A N/A Weeks of Treatment: Open N/A N/A Wound Status: No  N/A N/A Wound Recurrence: 1.8x1.8x0.3 N/A N/A Measurements L x W x D (cm) 2.545 N/A N/A A (cm) : rea 0.763 N/A N/A Volume (cm) : -58.90% N/A N/A % Reduction in A rea: -58.60% N/A N/A % Reduction in Volume: 7 Starting Position 1 (o'clock): 2 Ending Position 1 (o'clock): 0.5 Maximum Distance 1 (cm): Yes N/A N/A Undermining: Grade 1 N/A N/A Classification: Medium N/A N/A Exudate A mount: Serosanguineous N/A N/A Exudate Type: red, brown N/A N/A Exudate Color: Well defined, not attached N/A N/A Wound Margin: Large (67-100%) N/A N/A Granulation A mount: Red, Hyper-granulation N/A N/A Granulation Quality: None Present (0%) N/A N/A Necrotic A mount: Fat Layer (Subcutaneous Tissue): Yes N/A N/A Exposed Structures: Fascia: No Tendon: No Muscle: No Joint: No Bone: No None N/A N/A Epithelialization: Treatment Notes Wound #1 (Foot) Wound Laterality: Plantar, Left Cleanser Soap and Water Discharge Instruction: May shower and wash wound with dial antibacterial soap and water prior to dressing change. Peri-Wound Care Topical Primary Dressing MediHoney Gel, tube 1.5 (oz) Discharge Instruction: Use until you get Regranex Regranex Gel Secondary Dressing Bordered Gauze, 2x3.75 in Discharge Instruction: Apply over primary dressing as directed. Woven Gauze Sponges 2x2 in Discharge Instruction: Apply over primary dressing as  directed. Secured With Compression Wrap Compression Stockings Environmental education officer) Signed: 01/09/2022 4:20:15 PM By: Kalman Shan DO Signed: 01/09/2022 5:00:27 PM By: Lorrin Jackson Entered By: Kalman Shan on 01/09/2022 13:31:17 -------------------------------------------------------------------------------- Multi-Disciplinary Care Plan Details Patient Name: Date of Service: [name], Tyrone [name] 01/09/2022 12:30 PM Medical Record Number: [id number] Patient Account Number: [id number] Date of Birth/Sex: Treating RN: Apr 29, 1941 (81 y.o. [name] Primary Care Lyric Hoar: [name] Other Clinician: Referring Africa Masaki: Treating Alizon Schmeling/Extender: Laurena Slimmer Weeks in Treatment: 2 Active Inactive Wound/Skin Impairment Nursing Diagnoses: Impaired tissue integrity Goals: Patient/caregiver will verbalize understanding of skin care regimen Date Initiated: 12/26/2021 Target Resolution Date: 01/23/2022 Goal Status: Active Ulcer/skin breakdown will have a volume reduction of 30% by week 4 Date Initiated: 12/26/2021 Target Resolution Date: 01/23/2022 Goal Status: Active Interventions: Assess patient/caregiver ability to obtain necessary supplies Assess patient/caregiver ability to perform ulcer/skin care regimen upon admission and as needed Assess ulceration(s) every visit Provide education on ulcer and skin care Treatment Activities: Topical wound management initiated : 12/26/2021 Notes: Electronic Signature(s) Signed: 01/09/2022 5:00:27 PM By: Lorrin Jackson Entered By: Lorrin Jackson on 01/09/2022 12:35:02 -------------------------------------------------------------------------------- Pain Assessment Details Patient Name: Date of Service: [name], Tyrone [name] 01/09/2022 12:30 PM Medical Record Number: [id number] Patient Account Number: [id number] Date of Birth/Sex: Treating RN: 1941-04-26 (81 y.o. [name] Primary Care Jaleisa Brose:  [name] Other Clinician: Referring Akul Leggette: Treating Yasmine Kilbourne/Extender: Laurena Slimmer Weeks in Treatment: 2 Active Problems Location of Pain Severity and Description of Pain Patient Has Paino No Site Locations Pain Management and Medication Current Pain Management: Electronic Signature(s) Signed: 01/09/2022 5:00:27 PM By: Lorrin Jackson Entered By: Lorrin Jackson on 01/09/2022 12:40:03 -------------------------------------------------------------------------------- Patient/Caregiver Education Details Patient Name: Date of Service: [name]. 8/15/2023andnbsp12:30 PM Medical Record Number: [id number] Patient Account Number: [id number] Date of Birth/Gender: Treating RN: Aug 26, 1940 (81 y.o. [name] Primary Care Physician: [name] Other Clinician: Referring Physician: Treating Physician/Extender: [name] in Treatment: 2 Education Assessment Education Provided To: Patient Education Topics Provided Venous: Methods: Explain/Verbal, Printed Responses: State content correctly Wound/Skin Impairment: Methods: Explain/Verbal, Printed Responses: State content correctly Electronic Signature(s) Signed: 01/09/2022 5:00:27 PM By: Lorrin Jackson Entered By: Lorrin Jackson on 01/09/2022 12:35:21 --------------------------------------------------------------------------------  Wound Assessment Details Patient Name: Date of Service: [name] 01/09/2022 12:30 PM Medical Record Number: [id number] Patient Account Number: [id number] Date of Birth/Sex: Treating RN: 1940-06-22 (81 y.o. [name] Primary Care Delois Silvester: [name] Other Clinician: Referring Hanako Tipping: Treating Benz Vandenberghe/Extender: Laurena Slimmer Weeks in Treatment: 2 Wound Status Wound Number: 1 Primary Diabetic Wound/Ulcer of the Lower Extremity Etiology: Wound Location: Left, Plantar Foot Wound Open Wounding  Event: Gradually Appeared Status: Date Acquired: 01/24/2021 Comorbid Arrhythmia, Congestive Heart Failure, Coronary Artery Disease, Weeks Of Treatment: 2 History: Type II Diabetes, Osteoarthritis, Neuropathy Clustered Wound: No Photos Wound Measurements Length: (cm) 1.8 Width: (cm) 1.8 Depth: (cm) 0.3 Area: (cm) 2.545 Volume: (cm) 0.763 Wound Description Classification: Grade 1 Wound Margin: Well defined, not attached Exudate Amount: Medium Exudate Type: Serosanguineous Exudate Color: red, brown Foul Odor After Cleansing: No Slough/Fibrino No % Reduction in Area: -58.9% % Reduction in Volume: -58.6% Epithelialization: None Tunneling: No Undermining: Yes Starting Position (o'clock): 7 Ending Position (o'clock): 2 Maximum Distance: (cm) 0.5 Wound Bed Granulation Amount: Large (67-100%) Exposed Structure Granulation Quality: Red, Hyper-granulation Fascia Exposed: No Necrotic Amount: None Present (0%) Fat Layer (Subcutaneous Tissue) Exposed: Yes Tendon Exposed: No Muscle Exposed: No Joint Exposed: No Bone Exposed: No Treatment Notes Wound #1 (Foot) Wound Laterality: Plantar, Left Cleanser Soap and Water Discharge Instruction: May shower and wash wound with dial antibacterial soap and water prior to dressing change. Peri-Wound Care Topical Primary Dressing MediHoney Gel, tube 1.5 (oz) Discharge Instruction: Use until you get Regranex Regranex Gel Secondary Dressing Bordered Gauze, 2x3.75 in Discharge Instruction: Apply over primary dressing as directed. Woven Gauze Sponges 2x2 in Discharge Instruction: Apply over primary dressing as directed. Secured With Compression Wrap Compression Stockings Environmental education officer) Signed: 01/09/2022 5:00:27 PM By: Lorrin Jackson Entered By: Lorrin Jackson on 01/09/2022 12:44:36 -------------------------------------------------------------------------------- Vitals Details Patient Name: Date of Service: [name],  Tyrone [name] 01/09/2022 12:30 PM Medical Record Number: [id number] Patient Account Number: [id number] Date of Birth/Sex: Treating RN: Mar 16, 1941 (81 y.o. [name] Primary Care Ediel Unangst: [name] Other Clinician: Referring Ranyia Witting: Treating Orren Pietsch/Extender: [name] in Treatment: 2 Vital Signs Time Taken: 12:39 Temperature (F): 97.7 Height (in): 72 Pulse (bpm): 55 Weight (lbs): 198 Respiratory Rate (breaths/min): 18 Body Mass Index (BMI): 26.9 Blood Pressure (mmHg): 110/68 Capillary Blood Glucose (mg/dl): 98 Reference Range: 80 - 120 mg / dl Electronic Signature(s) Signed: 01/09/2022 5:00:27 PM By: Lorrin Jackson Entered By: Lorrin Jackson on 01/09/2022 12:39:56

## 2022-01-10 NOTE — Progress Notes (Signed)
CAMILO, MANDER ([id number]) Visit Report for 01/09/2022 Chief Complaint Document Details Patient Name: Date of Service: [name] 01/09/2022 12:30 PM Medical Record Number: [id number] Patient Account Number: [id number] Date of Birth/Sex: Treating RN: 1940-09-18 (81 y.o. [name] Primary Care Provider: Dimas Chyle Other Clinician: Referring Provider: Treating Provider/Extender: Laurena Slimmer Weeks in Treatment: 2 Information Obtained from: Patient Chief Complaint 12/26/2021; left foot plantar foot wound Electronic Signature(s) Signed: 01/09/2022 4:20:15 PM By: Kalman Shan DO Entered By: Kalman Shan on 01/09/2022 13:31:25 -------------------------------------------------------------------------------- HPI Details Patient Name: Date of Service: [name], [name] 01/09/2022 12:30 PM Medical Record Number: [id number] Patient Account Number: [id number] Date of Birth/Sex: Treating RN: Dec 19, 1940 (81 y.o. [name] Primary Care Provider: Dimas Chyle Other Clinician: Referring Provider: Treating Provider/Extender: Laurena Slimmer Weeks in Treatment: 2 History of Present Illness HPI Description: 12/26/2021 Mr. Rayen Dafoe is an 81 year old male with a past medical history of controlled type 2 diabetes, A-fib on Eliquis, chronic systolic heart failure on Entresto and right below knee amputation (2017) that presents to the clinic for a left plantar foot wound. He states this has been present for 1 year and is not sure how it started. He has peripheral neuropathy. He follows with Dr. Sherryle Lis, podiatry for this issue. He had an MRI on 10/16/2021 that did not show evidence of osteomyelitis. He has tried collagen with some benefit. Currently patient is keeping the area covered with a bandaid and no dressing. Due to his right BKA he has issues with balance and uses a cane. He is not a good candidate for the total contact cast. Surgical  exostectomy has been thought of for offloading however risk and recovery is high. Patient currently denies signs of infection. 8/15; patient presents for follow-up. He did not pick up her Granix from the pharmacy. He has been keeping the area covered. We discussed potentially doing a skin substitute and he was interested in this. He denies signs of infection. Electronic Signature(s) Signed: 01/09/2022 4:20:15 PM By: Kalman Shan DO Entered By: Kalman Shan on 01/09/2022 13:31:53 -------------------------------------------------------------------------------- Physical Exam Details Patient Name: Date of Service: [name], [name] 01/09/2022 12:30 PM Medical Record Number: [id number] Patient Account Number: [id number] Date of Birth/Sex: Treating RN: 1940-11-16 (81 y.o. [name] Primary Care Provider: Dimas Chyle Other Clinician: Referring Provider: Treating Provider/Extender: Laurena Slimmer Weeks in Treatment: 2 Constitutional respirations regular, non-labored and within target range for patient.. Cardiovascular 2+ dorsalis pedis/posterior tibialis pulses. Psychiatric pleasant and cooperative. Notes Left foot: T the plantar medial aspect there is an open wound with granulation tissue throughout. Undermining from the 1 to 4 o'clock position. No signs of o surrounding infection. Electronic Signature(s) Signed: 01/09/2022 4:20:15 PM By: Kalman Shan DO Entered By: Kalman Shan on 01/09/2022 13:32:34 -------------------------------------------------------------------------------- Physician Orders Details Patient Name: Date of Service: [name], [name] 01/09/2022 12:30 PM Medical Record Number: [id number] Patient Account Number: [id number] Date of Birth/Sex: Treating RN: [birth year] (81 y.o. [name] Primary Care Provider: Dimas Chyle Other Clinician: Referring Provider: Treating Provider/Extender: Jessee Avers  in Treatment: 2 Verbal / Phone Orders: No Diagnosis Coding ICD-10 Coding Code Description E11.621 Type 2 diabetes mellitus with foot ulcer L97.522 Non-pressure chronic ulcer of other part of left foot with fat layer exposed E11.42 Type 2 diabetes mellitus with diabetic polyneuropathy E99.37 Chronic systolic (congestive) heart failure I48.0 Paroxysmal atrial fibrillation Z89.511 Acquired absence of right  leg below knee Follow-up Appointments ppointment in 2 weeks. - 01/23/22 @ 10:15am with Dr. Heber [place] and Leveda Anna, RN (Room 7) Return A Other: - Call Dr. Sherryle Lis at Podiatry to see about diabetic shoe or inserts. Bathing/ Shower/ Hygiene May shower and wash wound with soap and water. Edema Control - Lymphedema / SCD / Other Elevate legs to the level of the heart or above for 30 minutes daily and/or when sitting, a frequency of: - several times a day Avoid standing for long periods of time. Patient to wear own compression stockings every day. Additional Orders / Instructions Follow Nutritious Diet - Monitor/Control Blood Sugars Wound Treatment Wound #1 - Foot Wound Laterality: Plantar, Left Cleanser: Soap and Water 1 x Per Day/30 Days Discharge Instructions: May shower and wash wound with dial antibacterial soap and water prior to dressing change. Prim Dressing: MediHoney Gel, tube 1.5 (oz) 1 x Per Day/30 Days ary Discharge Instructions: Use until you get Regranex Prim Dressing: Regranex Gel ary 1 x Per Day/30 Days Secondary Dressing: Bordered Gauze, 2x3.75 in 1 x Per Day/30 Days Discharge Instructions: Apply over primary dressing as directed. Secondary Dressing: Woven Gauze Sponges 2x2 in 1 x Per Day/30 Days Discharge Instructions: Apply over primary dressing as directed. Electronic Signature(s) Signed: 01/09/2022 4:20:15 PM By: Kalman Shan DO Entered By: Kalman Shan on 01/09/2022  13:32:43 -------------------------------------------------------------------------------- Problem List Details Patient Name: Date of Service: [name], [name] 01/09/2022 12:30 PM Medical Record Number: [id number] Patient Account Number: [id number] Date of Birth/Sex: Treating RN: [birth year] (81 y.o. [name] Primary Care Provider: Dimas Chyle Other Clinician: Referring Provider: Treating Provider/Extender: Laurena Slimmer Weeks in Treatment: 2 Active Problems ICD-10 Encounter Code Description Active Date MDM Diagnosis E11.621 Type 2 diabetes mellitus with foot ulcer 12/26/2021 No Yes L97.522 Non-pressure chronic ulcer of other part of left foot with fat layer exposed 12/26/2021 No Yes E11.42 Type 2 diabetes mellitus with diabetic polyneuropathy 12/26/2021 No Yes F62.13 Chronic systolic (congestive) heart failure 12/26/2021 No Yes I48.0 Paroxysmal atrial fibrillation 12/26/2021 No Yes Z89.511 Acquired absence of right leg below knee 12/26/2021 No Yes Inactive Problems Resolved Problems Electronic Signature(s) Signed: 01/09/2022 4:20:15 PM By: Kalman Shan DO Entered By: Kalman Shan on 01/09/2022 13:31:10 -------------------------------------------------------------------------------- Progress Note Details Patient Name: Date of Service: [name], [name] 01/09/2022 12:30 PM Medical Record Number: [id number] Patient Account Number: [id number] Date of Birth/Sex: Treating RN: [birth year] (81 y.o. [name] Primary Care Provider: Dimas Chyle Other Clinician: Referring Provider: Treating Provider/Extender: Jessee Avers in Treatment: 2 Subjective Chief Complaint Information obtained from Patient 12/26/2021; left foot plantar foot wound History of Present Illness (HPI) 12/26/2021 Mr. Altariq Goodall is an 81 year old male with a past medical history of controlled type 2 diabetes, A-fib on Eliquis, chronic systolic heart failure on  Entresto and right below knee amputation (2017) that presents to the clinic for a left plantar foot wound. He states this has been present for 1 year and is not sure how it started. He has peripheral neuropathy. He follows with Dr. Sherryle Lis, podiatry for this issue. He had an MRI on 10/16/2021 that did not show evidence of osteomyelitis. He has tried collagen with some benefit. Currently patient is keeping the area covered with a bandaid and no dressing. Due to his right BKA he has issues with balance and uses a cane. He is not a good candidate for the total contact cast. Surgical exostectomy has been thought of for offloading however risk  and recovery is high. Patient currently denies signs of infection. 8/15; patient presents for follow-up. He did not pick up her Granix from the pharmacy. He has been keeping the area covered. We discussed potentially doing a skin substitute and he was interested in this. He denies signs of infection. Patient History Information obtained from Patient, Chart. Family History Heart Disease - Siblings, Hypertension - Siblings, Stroke - Father, No family history of Cancer, Diabetes, Hereditary Spherocytosis, Kidney Disease, Lung Disease, Seizures, Thyroid Problems, Tuberculosis. Social History Former smoker - Quit in 1983, Marital Status - Widowed, Alcohol Use - Rarely, Drug Use - No History, Caffeine Use - Rarely. Medical History Cardiovascular Patient has history of Arrhythmia, Congestive Heart Failure, Coronary Artery Disease Endocrine Patient has history of Type II Diabetes Musculoskeletal Patient has history of Osteoarthritis Neurologic Patient has history of Neuropathy Medical A Surgical History Notes nd Genitourinary Hx BPH Oncologic Basal Cell Carcinoma (Face) Objective Constitutional respirations regular, non-labored and within target range for patient.. Vitals Time Taken: 12:39 PM, Height: 72 in, Weight: 198 lbs, BMI: 26.9, Temperature: 97.7  F, Pulse: 55 bpm, Respiratory Rate: 18 breaths/min, Blood Pressure: 110/68 mmHg, Capillary Blood Glucose: 98 mg/dl. Cardiovascular 2+ dorsalis pedis/posterior tibialis pulses. Psychiatric pleasant and cooperative. General Notes: Left foot: T the plantar medial aspect there is an open wound with granulation tissue throughout. Undermining from the 1 to 4 o'clock position. o No signs of surrounding infection. Integumentary (Hair, Skin) Wound #1 status is Open. Original cause of wound was Gradually Appeared. The date acquired was: 01/24/2021. The wound has been in treatment 2 weeks. The wound is located on the [place]. The wound measures 1.8cm length x 1.8cm width x 0.3cm depth; 2.545cm^2 area and 0.763cm^3 volume. There is Fat Layer (Subcutaneous Tissue) exposed. There is no tunneling noted, however, there is undermining starting at 7:00 and ending at 2:00 with a maximum distance of 0.5cm. There is a medium amount of serosanguineous drainage noted. The wound margin is well defined and not attached to the wound base. There is large (67-100%) red, hyper - granulation within the wound bed. There is no necrotic tissue within the wound bed. Assessment Active Problems ICD-10 Type 2 diabetes mellitus with foot ulcer Non-pressure chronic ulcer of other part of left foot with fat layer exposed Type 2 diabetes mellitus with diabetic polyneuropathy Chronic systolic (congestive) heart failure Paroxysmal atrial fibrillation Acquired absence of right leg below knee Patient's wound is stable. It is unclear why he did not pick up Regranex from the pharmacy. I recommended he try this therapy if cost is not an issue. We also discussed a skin substitute and he was interested in trying this. We will run an IVR for Grafix and organogenesis products. By the time he obtains Regranix I recommended using Medihoney. This was placed in office today. Continue with aggressive offloading however this is very  difficult as patient has a prosthesis to the right side. Plan Follow-up Appointments: Return Appointment in 2 weeks. - 01/23/22 @ 10:15am with Dr. Heber [place] and Leveda Anna, RN (Room 7) Other: - Call Dr. Sherryle Lis at Podiatry to see about diabetic shoe or inserts. Bathing/ Shower/ Hygiene: May shower and wash wound with soap and water. Edema Control - Lymphedema / SCD / Other: Elevate legs to the level of the heart or above for 30 minutes daily and/or when sitting, a frequency of: - several times a day Avoid standing for long periods of time. Patient to wear own compression stockings every day. Additional Orders / Instructions: Follow  Nutritious Diet - Monitor/Control Blood Sugars WOUND #1: - Foot Wound Laterality: Plantar, Left Cleanser: Soap and Water 1 x Per Day/30 Days Discharge Instructions: May shower and wash wound with dial antibacterial soap and water prior to dressing change. Prim Dressing: MediHoney Gel, tube 1.5 (oz) 1 x Per Day/30 Days ary Discharge Instructions: Use until you get Regranex Prim Dressing: Regranex Gel 1 x Per Day/30 Days ary Secondary Dressing: Bordered Gauze, 2x3.75 in 1 x Per Day/30 Days Discharge Instructions: Apply over primary dressing as directed. Secondary Dressing: Woven Gauze Sponges 2x2 in 1 x Per Day/30 Days Discharge Instructions: Apply over primary dressing as directed. 1. Medihoney 2. Can start Regranex and stop Medihoney if he picks up from the pharmacy 3. Run IVR for Grafix and organogenesis products 4. Follow-up in 2 weeks Electronic Signature(s) Signed: 01/09/2022 4:20:15 PM By: Kalman Shan DO Entered By: Kalman Shan on 01/09/2022 13:35:38 -------------------------------------------------------------------------------- HxROS Details Patient Name: Date of Service: [name], [name] 01/09/2022 12:30 PM Medical Record Number: [id number] Patient Account Number: [id number] Date of Birth/Sex: Treating RN: 10-18-1940 (81 y.o. [name] Primary Care Provider: Other Clinician: Dimas Chyle Referring Provider: Treating Provider/Extender: Jessee Avers in Treatment: 2 Information Obtained From Patient Chart Cardiovascular Medical History: Positive for: Arrhythmia; Congestive Heart Failure; Coronary Artery Disease Endocrine Medical History: Positive for: Type II Diabetes Treated with: Oral agents Genitourinary Medical History: Past Medical History Notes: Hx BPH Musculoskeletal Medical History: Positive for: Osteoarthritis Neurologic Medical History: Positive for: Neuropathy Oncologic Medical History: Past Medical History Notes: Basal Cell Carcinoma (Face) Immunizations Pneumococcal Vaccine: Received Pneumococcal Vaccination: Yes Received Pneumococcal Vaccination On or After 60th Birthday: Yes Implantable Devices None Family and Social History Cancer: No; Diabetes: No; Heart Disease: Yes - Siblings; Hereditary Spherocytosis: No; Hypertension: Yes - Siblings; Kidney Disease: No; Lung Disease: No; Seizures: No; Stroke: Yes - Father; Thyroid Problems: No; Tuberculosis: No; Former smoker - Quit in 1983; Marital Status - Widowed; Alcohol Use: Rarely; Drug Use: No History; Caffeine Use: Rarely; Financial Concerns: No; Food, Clothing or Shelter Needs: No; Support System Lacking: No; Transportation Concerns: No Electronic Signature(s) Signed: 01/09/2022 4:20:15 PM By: Kalman Shan DO Signed: 01/09/2022 5:00:27 PM By: Lorrin Jackson Entered By: Kalman Shan on 01/09/2022 13:31:59 -------------------------------------------------------------------------------- SuperBill Details Patient Name: Date of Service: [name], [name] 01/09/2022 Medical Record Number: [id number] Patient Account Number: [id number] Date of Birth/Sex: Treating RN: 1940/06/15 (81 y.o. [name] Primary Care Provider: Dimas Chyle Other Clinician: Referring Provider: Treating Provider/Extender:  Laurena Slimmer Weeks in Treatment: 2 Diagnosis Coding ICD-10 Codes Code Description [phone] Type 2 diabetes mellitus with foot ulcer L97.522 Non-pressure chronic ulcer of other part of left foot with fat layer exposed E11.42 Type 2 diabetes mellitus with diabetic polyneuropathy G92.11 Chronic systolic (congestive) heart failure I48.0 Paroxysmal atrial fibrillation Z89.511 Acquired absence of right leg below knee Facility Procedures CPT4 Code: [id number] Description: 99213 - WOUND CARE VISIT-LEV 3 EST PT Modifier: Quantity: 1 Physician Procedures : CPT4 Code Description Modifier [id number] - WC PHYS LEVEL 3 - EST PT ICD-10 Diagnosis Description E11.621 Type 2 diabetes mellitus with foot ulcer L97.522 Non-pressure chronic ulcer of other part of left foot with fat layer exposed E11.42 Type 2  diabetes mellitus with diabetic polyneuropathy Z89.511 Acquired absence of right leg below knee Quantity: 1 Electronic Signature(s) Signed: 01/09/2022 4:20:15 PM By: Kalman Shan DO Entered By: Kalman Shan on 01/09/2022 13:36:22

## 2022-01-11 ENCOUNTER — Ambulatory Visit (INDEPENDENT_AMBULATORY_CARE_PROVIDER_SITE_OTHER): Payer: Medicare Other | Admitting: Internal Medicine

## 2022-01-11 ENCOUNTER — Encounter: Payer: Self-pay | Admitting: Internal Medicine

## 2022-01-11 VITALS — BP 118/62 | HR 74 | Ht 72.0 in | Wt 209.0 lb

## 2022-01-11 DIAGNOSIS — I428 Other cardiomyopathies: Secondary | ICD-10-CM

## 2022-01-11 DIAGNOSIS — Z89511 Acquired absence of right leg below knee: Secondary | ICD-10-CM

## 2022-01-11 DIAGNOSIS — I2510 Atherosclerotic heart disease of native coronary artery without angina pectoris: Secondary | ICD-10-CM | POA: Diagnosis not present

## 2022-01-11 DIAGNOSIS — I10 Essential (primary) hypertension: Secondary | ICD-10-CM

## 2022-01-11 DIAGNOSIS — I480 Paroxysmal atrial fibrillation: Secondary | ICD-10-CM

## 2022-01-11 DIAGNOSIS — I5022 Chronic systolic (congestive) heart failure: Secondary | ICD-10-CM

## 2022-01-11 NOTE — Progress Notes (Signed)
OFFICE NOTE  Chief Complaint:  Follow-up  Primary Care Physician: Vivi Barrack, MD  HPI:  [name] is a 81 y.o. male he was previously seen by Dr. Percival Spanish last in 2013. He was followed for PVCs at that time and underwent stress testing. There is no evidence for ischemia and no further workup was recommended. Recently was hospitalized for sepsis and was noted to have intermittent SVTs as well as PVCs on telemetry. Although he was not seen by cardiology as an inpatient, we were consult it for outpatient monitor. He was placed on a monitor which I reviewed and is still in progress. The first 24 days of the monitor show frequent PVCs as well as intermittent SVT. During this past hospitalization he had an echocardiogram which shows a newly reduced LVEF of 45-50%, with basal inferior akinesis and inferolateral hypokinesis and grade 2 diastolic dysfunction. This compares to a normal LVF and 2013 by echo. He did have a remote left heart catheterization in 2008 which showed minimal coronary artery disease. He denies any chest pain or worsening shortness of breath. He is having trouble ambulating due to problems with his right ankle. He also reports being under significant stress dealing with his wife who has Parkinson's. He is in the process of moving to friends [place].  01/30/2017  [name] returns today for follow-up. He has had a difficult year. His wife was diagnosed with Parkinson's and ultimately died of congestive heart failure. He developed a diabetic wound and then had the transtibial amputation of the right leg. He was supposed going to friends home but ultimately went to [facility] for rehabilitation and is currently back at home. He does have a leg prostheses. He continues to have SVT in fact has a short RP tachycardia which was caught today in the office. He says is asymptomatic with this. EF is shown to be 45-50% in the past. He did undergo nuclear stress test which was negative for  ischemia prior to his amputation. He denies any chest pain.  09/03/2017  [name] returns today for follow-up.  He has no new complaints today.  He has been struggling with some back pain.  He feels like his right leg prosthesis may be a little short.  He denies any recurrent SVT.  He was placed on the carvedilol for cardiomyopathy with EF 45-50% in 2017 and a blood pressures been well controlled on that.  He denies any chest pain.  He reports occasional shortness of breath sometimes with exertion but also at rest.  10/02/2017  [name] was seen today in follow-up.  He continues to get some care from the [place].  He did report some problems with his right leg prosthesis.  He has not been able to exercise since then.  He gets mild shortness of breath with exertion, consistent with NYHA class II symptoms.  Recent echo was performed to see if he had any improvement in LV function, unfortunately however he had a decline in LVEF to 40 to 45%.  There is additionally some mild to moderate RV dysfunction.  He is only on carvedilol.  I think he would benefit from the addition of Entresto.  Labs last year indicated normal renal function.  11/12/2017  [name] returns today for follow-up.  His main concerns are frequent urination both throughout the day and at night.  He was started on tamsulosin by his PCP which he takes 0.4 mg in the morning.  He does not note  any significant improvement.  I had started him on Entresto however due to frequent urination he discontinued the medication.  He was then switched to lisinopril.  He seems to be tolerating this.  LVEF had declined recently to 40 to 45%.  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  05/12/2018  [name] is seen today in follow-up.  He does report some improvement in his urination.  I increase his dose to 0.8 mg tamsulosin however he said he had excessive urination with this and seems to do better  on the 0.4 mg.  His PCP had switched him to Rapaflo, but he said that was also worse.  He denies any worsening shortness of breath or chest pain.  He unfortunately was intolerant of Entresto due to frequent urination.  His EF had lower down to 40 to 45%.  Hopefully this is all related to his infection and now he is status post amputation and recovering.  He will need a repeat assessment of his EF next summer.  He also is reported some cough.  This might be related to lisinopril.  07/03/2018  [name] is seen today in follow-up of left heart cath.  This was performed for declining LVEF, episodes of chest pain at rest, and history of coronary disease.  He underwent left heart catheterization which demonstrated moderate coronary atherosclerosis with 70% stenosis of a small obtuse marginal, 40% stenosis in the first diagonal, 50% mid LAD stenosis, 40% mid RCA and 60% proximal PDA stenosis.  No significant obstructive coronary disease was identified and LVEDP was normal.  Aggressive secondary risk factor modification was recommended.  He returns today and says he is feeling great.  He says he is not sure what all the work-up was for.  I am concerned that because of his frequency of PVCs that at this point he may also have had a PVC mediated cardiomyopathy.  He was ordered to wear a monitor however he declined.  He does not see the value in that.  Therefore we may be left only with medical therapy.  01/21/2019  [name] returns today for follow-up.  Overall he seems to be doing well.  He is actually in a great mood.  He says he is rekindled a relationship with former girlfriend now 2 years after his wife died.  In addition his energy level is good.  He actually purchased exercise equipment and is now been working out in his house.  He denies any worsening shortness of breath.  He did have a repeat echo in June which showed unfortunately no significant change in LV function at 40 to 45%, however is not worsened.  He  feels like he has had excessive urination on Entresto and I am hesitant to increase the dose further.  We did recommend decreasing his tamsulosin back to 0.4 mg daily.  He also asked about Viagra-I would feel that this is safe for him to use however he should monitor for hypotension.  One other option to consider would be daily Cialis in him if he has some issue with BPH as well.  I will defer this to his PCP as he has an upcoming visit in a few days.  07/27/2019  [name] is seen today in follow-up.  He continues to report feeling fairly well.  He like to get back to playing some golf.  His echo shows stable LVEF of 40 to 45%.  He remains on Entresto but notes that because of excessive urination I could not increase his Entresto any further.  Blood pressures well controlled.  Denies any chest pain or worsening shortness of breath.  10/11/2020  [name] returns today for follow-up.  Overall he says he is feeling pretty well.  He seems to be getting around after having a right BKA.  He is trying to play golf again.  He denies any worsening shortness of breath or chest pain.  He is tolerating the Entresto.  His last echo showed moderate reduction in LV function however that was in 2020.  Blood pressure is well controlled.  EKG shows a sinus bradycardia.  01/11/2022  [name] returns today for follow-up.  Overall he seems to be doing well.  He has developed some poorly healing wounds to the left lower extremity and is status post right BKA.  He is getting wound care and has an upcoming lower extremity arterial Doppler on the left.  He does seem to think it is getting better.  He is LVEF has improved by echo up to 50 to 55% in June with a stable dilated aortic root measuring 41 mm.  He has no more worse than NYHA class II symptoms.  He remains on Entresto and carvedilol.  Blood pressure is well controlled at 118/62.  PMHx:  Past Medical History:  Diagnosis Date   CAD (coronary artery disease)    a. Remote  cath 2008 for chest pain showed 30% LAD with luminal irregularity and fairly heavy calcification in the mid LAD without critical stenosis, 30% OM1, 30% acute marginal.   Cancer (HCC)    skin cancer - basal   Cataract    both   Chronic combined systolic and diastolic CHF (congestive heart failure) (HCC)    Complication of anesthesia    Diabetes mellitus    Type II   ED (erectile dysfunction)    History of kidney stones    x1   Hypertension    NICM (nonischemic cardiomyopathy) ([place])    a. EF 40-45% by echo in 08/2017 - prior low risk stress test in 2017.   Osteoarthritis    Paroxysmal SVT (supraventricular tachycardia) (HCC)    PONV (postoperative nausea and vomiting) 1960   with Ether   PVC's (premature ventricular contractions)    PVD (peripheral vascular disease) (HCC)    s/p R BKA   Rheumatic fever    Rhinitis    S/P BKA (below knee amputation), right [facility])     Past Surgical History:  Procedure Laterality Date   AMPUTATION Right 02/29/2016   Procedure: Right Leg AMPUTATION BELOW KNEE with Wound Vac placement;  Surgeon: [name], MD;  Location: [place];  Service: Orthopedics;  Laterality: Right;   CARDIOVERSION N/A 06/08/2021   Procedure: CARDIOVERSION;  Surgeon: Jerline Pain, MD;  Location: [facility] ENDOSCOPY;  Service: Cardiovascular;  Laterality: N/A;   CHOLECYSTECTOMY  1983   colonoscopy  2006, 2010   EYE SURGERY Bilateral    cataract   INGUINAL HERNIA REPAIR Right 1960   LEFT HEART CATH AND CORONARY ANGIOGRAPHY N/A 06/03/2018   Procedure: LEFT HEART CATH AND CORONARY ANGIOGRAPHY;  Surgeon: Belva Crome, MD;  Location: [place] CV LAB;  Service: Cardiovascular;  Laterality: N/A;   STUMP REVISION Right 10/30/2017   Procedure: RIGHT BELOW KNEE AMPUTATION REVISION;  Surgeon: [name], MD;  Location: [place];  Service: Orthopedics;  Laterality: Right;    FAMHx:  Family History  Problem Relation Age of Onset   Stroke Father 14       Died age 59   Coronary artery  disease Brother 62   Colon cancer Neg Hx    Colon polyps Neg Hx    Diabetes Neg Hx    Esophageal cancer Neg Hx    Kidney disease Neg Hx    Gallbladder disease Neg Hx     SOCHx:   reports that he quit smoking about 40 years ago. His smoking use included cigarettes. He has never used smokeless tobacco. He reports that he does not currently use alcohol. He reports that he does not use drugs.  ALLERGIES:  Allergies  Allergen Reactions   Lisinopril Cough    ROS: Pertinent items noted in HPI and remainder of comprehensive ROS otherwise negative.  HOME MEDS: Current Outpatient Medications  Medication Sig Dispense Refill   Accu-Chek Softclix Lancets lancets Use to check blood sugar three times a day. 300 each 4   acetaminophen (TYLENOL) 500 MG tablet Take 500-1,000 mg by mouth every 6 (six) hours as needed (pain).     apixaban (ELIQUIS) 5 MG TABS tablet Take 1 tablet (5 mg total) by mouth 2 (two) times daily. 180 tablet 1   carvedilol (COREG) 25 MG tablet Take 1 tablet (25 mg total) by mouth 2 (two) times daily. 180 tablet 3   furosemide (LASIX) 20 MG tablet Take 2 tablets (40 mg total) by mouth daily. 180 tablet 1   gabapentin (NEURONTIN) 100 MG capsule TAKE 1 CAPSULE BY MOUTH THREE TIMES A DAY Strength: 100 mg 270 capsule 3   glucose blood (ACCU-CHEK AVIVA PLUS) test strip Use 3 times daily. Morning, noon, and nightly to check glucose levels Dx: E11.9 (type 2 diabetes mellitus) 100 strip 11   halobetasol (ULTRAVATE) 0.05 % cream APPLY TO AFFECTED AREA TWICE A DAY 60 g 2   metFORMIN (GLUCOPHAGE) 1000 MG tablet TAKE 1 TABLET BY MOUTH EVERY DAY WITH BREAKFAST 90 tablet 1   mupirocin ointment (BACTROBAN) 2 % Apply 1 application. topically daily. 30 g 2   nystatin cream (MYCOSTATIN) APPLY TO AFFECTED AREA TWICE A DAY (Patient taking differently: Apply 1 application  topically 2 (two) times daily as needed (skin irritation).) 30 g 2   pantoprazole (PROTONIX) 40 MG tablet Take 1 tablet (40 mg  total) by mouth daily. 90 tablet 1   potassium chloride SA (KLOR-CON M20) 20 MEQ tablet TAKE 2 TABLETS BY MOUTH EVERY MORNING 180 tablet 3   sacubitril-valsartan (ENTRESTO) 49-51 MG Take 1 tablet by mouth 2 (two) times daily. 180 tablet 1   sildenafil (VIAGRA) 100 MG tablet TAKE 1 TABLET DAILY AS NEEDED ED 4 tablet 22   solifenacin (VESICARE) 10 MG tablet Take 1 tablet (10 mg total) by mouth daily. 30 tablet 2   tamsulosin (FLOMAX) 0.4 MG CAPS capsule TAKE 1 CAPSULE BY MOUTH EVERY DAY (Patient taking differently: Take 0.4 mg by mouth daily after breakfast.) 90 capsule 3   cephALEXin (KEFLEX) 500 MG capsule Take 1 capsule (500 mg total) by mouth 3 (three) times daily. (Patient not taking: Reported on 01/11/2022) 30 capsule 0   No current facility-administered medications for this visit.    LABS/IMAGING: No results found for this or any previous visit (from the past 48 hour(s)). No results found.  WEIGHTS: Wt Readings from Last 3 Encounters:  01/11/22 209 lb (94.8 kg)  11/14/21 209 lb 3.2 oz (94.9 kg)  10/02/21 208 lb 9.6 oz (94.6 kg)  VITALS: BP 118/62   Pulse 74   Ht 6' (1.829 m)   Wt 209 lb (94.8 kg)   BMI 28.35 kg/m   EXAM: General appearance: alert, no distress and mildly obese Lungs: clear to auscultation bilaterally Heart: regular rate and rhythm, S1, S2 normal, no murmur, click, rub or gallop Abdomen: soft, non-tender; bowel sounds normal; no masses,  no organomegaly Extremities: Trace left lower extremity edema, status post right BKA Psych: Mildly anxious  EKG: Normal sinus rhythm at 74, LVH by voltage-personally reviewed  ASSESSMENT: Non-ischemic cardiomyopathy with EF 40-45% (08/2017) -> proved to 50 to 55% (10/2021), NYHA Class II symptoms Recurrent short RP tachycardia  Hypertension Type 2 diabetes Right BKA Dilated aortic root to 41 mm  PLAN: 1.   [name] seems to be doing well with no worse than NYHA class II heart failure symptoms.  He has purchased  exercise equipment and is going to try to do that at home.  He denies any tachycardia or atrial fibrillation.  He remains on Eliquis.  Blood pressure is well controlled.  He is undergoing wound care for his left lower extremity and has upcoming arterial Dopplers.  Echo showed a stable dilated aortic root at 41 mm.  We will continue to follow this.  No changes in his medicines today.  Plan follow-up annually or sooner as necessary.  Pixie Casino, MD, [facility], [place] Director of the Advanced Lipid Disorders &  Cardiovascular Risk Reduction Clinic Diplomate of the American Board of Clinical Lipidology Attending Cardiologist  Direct Dial: [phone]  Fax: [phone]  Website:  [email or web address] 01/11/2022, 9:01 AM

## 2022-01-11 NOTE — Patient Instructions (Signed)
Medication Instructions:  NO CHANGES  *If you need a refill on your cardiac medications before your next appointment, please call your pharmacy*  Follow-Up: At [facility], you and your health needs are our priority.  As part of our continuing mission to provide you with exceptional heart care, we have created designated Provider Care Teams.  These Care Teams include your primary Cardiologist (physician) and Advanced Practice Providers (APPs -  Physician Assistants and Nurse Practitioners) who all work together to provide you with the care you need, when you need it.  We recommend signing up for the patient portal called "MyChart".  Sign up information is provided on this After Visit Summary.  MyChart is used to connect with patients for Virtual Visits (Telemedicine).  Patients are able to view lab/test results, encounter notes, upcoming appointments, etc.  Non-urgent messages can be sent to your provider as well.   To learn more about what you can do with MyChart, go to [email or web address].    Your next appointment:   12 month(s)  The format for your next appointment:   In Person  Provider:   Pixie Casino, MD {

## 2022-01-19 ENCOUNTER — Telehealth: Payer: Self-pay | Admitting: Family Medicine

## 2022-01-19 NOTE — Telephone Encounter (Signed)
He can take a test prior to going but if he is not having symptoms he does not need to do anything else at this time.  Algis Greenhouse. Jerline Pain, MD 01/19/2022 12:21 PM

## 2022-01-19 NOTE — Telephone Encounter (Signed)
Pt states: -last interacted with Grandson 08/18. -Grandson developed symptoms "a couple of days ago" -Grandson tested positive around 08/24. -he (the patient) does not have any symptoms -he has not taken any kind of COIVD-19 test. -he is scheduled to travel to a reunion tomorrow (08/26).   Pt asks: -what should he do? : Mask, Antigen tests, PCR test, Cancel trip, do nothing until symptoms develop, etc.   Pt requests: -call back asap.

## 2022-01-19 NOTE — Telephone Encounter (Signed)
Left message to return call to our office at their convenience.  Left voice massage with PCP information  If any question please give [place] a call

## 2022-01-22 ENCOUNTER — Telehealth: Payer: Self-pay | Admitting: Family Medicine

## 2022-01-22 NOTE — Telephone Encounter (Signed)
Patient states: - Wheelchair is very worn out and he is in need of a new one Theatre manager on one of his wheels in coming off and brakes won't hold - He was informed that in order for social security to pay for this, he will need a prescription from PCP  Patient requests: - A prescription be sent in for a new wheelchair

## 2022-01-22 NOTE — Telephone Encounter (Signed)
Please advise 

## 2022-01-23 ENCOUNTER — Encounter (HOSPITAL_BASED_OUTPATIENT_CLINIC_OR_DEPARTMENT_OTHER): Payer: Medicare Other | Admitting: Internal Medicine

## 2022-01-23 ENCOUNTER — Other Ambulatory Visit: Payer: Self-pay | Admitting: *Deleted

## 2022-01-23 DIAGNOSIS — Z89511 Acquired absence of right leg below knee: Secondary | ICD-10-CM | POA: Diagnosis not present

## 2022-01-23 DIAGNOSIS — E1142 Type 2 diabetes mellitus with diabetic polyneuropathy: Secondary | ICD-10-CM | POA: Diagnosis not present

## 2022-01-23 DIAGNOSIS — L97522 Non-pressure chronic ulcer of other part of left foot with fat layer exposed: Secondary | ICD-10-CM

## 2022-01-23 DIAGNOSIS — I480 Paroxysmal atrial fibrillation: Secondary | ICD-10-CM | POA: Diagnosis not present

## 2022-01-23 DIAGNOSIS — I5022 Chronic systolic (congestive) heart failure: Secondary | ICD-10-CM | POA: Diagnosis not present

## 2022-01-23 DIAGNOSIS — E11621 Type 2 diabetes mellitus with foot ulcer: Secondary | ICD-10-CM | POA: Diagnosis not present

## 2022-01-23 MED ORDER — SOLIFENACIN SUCCINATE 10 MG PO TABS: 10.0000 mg | ORAL_TABLET | Freq: Every day | ORAL | 2 refills | Status: AC

## 2022-01-23 NOTE — Telephone Encounter (Signed)
Ok to write DME order.  [name]. Jerline Pain, MD 01/23/2022 8:55 AM

## 2022-01-23 NOTE — Progress Notes (Signed)
Tyrone, Roberts ([id number]) Visit Report for 01/23/2022 Arrival Information Details Patient Name: Date of Service: [name] 01/23/2022 10:15 A M Medical Record Number: [id number] Patient Account Number: [id number] Date of Birth/Sex: Treating RN: 08/14/1940 (81 y.o. [name] Primary Care Broughton Eppinger: Dimas Chyle Other Clinician: Referring Madisson Kulaga: Treating Terry Bolotin/Extender: Jessee Avers in Treatment: 4 Visit Information History Since Last Visit Added or deleted any medications: No Patient Arrived: [name] Any new allergies or adverse reactions: No Arrival Time: 10:36 Had a fall or experienced change in No Accompanied By: self activities of daily living that may affect Transfer Assistance: None risk of falls: Patient Requires Transmission-Based Precautions: No Signs or symptoms of abuse/neglect since last visito No Patient Has Alerts: Yes Hospitalized since last visit: No Patient Alerts: Patient on Blood Thinner Implantable device outside of the clinic excluding No ABI: R=Non Comp cellular tissue based products placed in the center since last visit: Has Dressing in Place as Prescribed: Yes Pain Present Now: No Electronic Signature(s) Signed: 01/23/2022 3:53:04 PM By: Erenest Blank Entered By: Erenest Blank on 01/23/2022 10:36:48 -------------------------------------------------------------------------------- Encounter Discharge Information Details Patient Name: Date of Service: [name], DA Tyrone E. 01/23/2022 10:15 A M Medical Record Number: [id number] Patient Account Number: [id number] Date of Birth/Sex: Treating RN: 06/07/1940 (81 y.o. [name] Primary Care [place]intyre Alexa: Dimas Chyle Other Clinician: Referring Nandita Mathenia: Treating Chauncey Bruno/Extender: Jessee Avers in Treatment: 4 Encounter Discharge Information Items Discharge Condition: Stable Ambulatory Status: Cane Discharge Destination:  Home Transportation: Private Auto Schedule Follow-up Appointment: Yes Clinical Summary of Care: Provided on 01/23/2022 Form Type Recipient Paper Patient Patient Electronic Signature(s) Signed: 01/23/2022 4:34:50 PM By: Lorrin Jackson Entered By: Lorrin Jackson on 01/23/2022 11:27:29 -------------------------------------------------------------------------------- Lower Extremity Assessment Details Patient Name: Date of Service: [name] Tyrone E. 01/23/2022 10:15 A M Medical Record Number: [id number] Patient Account Number: [id number] Date of Birth/Sex: Treating RN: 12/25/1940 (81 y.o. [name] Primary Care Jeret Goyer: Dimas Chyle Other Clinician: Referring Gerrell Tabet: Treating Kimarie Coor/Extender: Laurena Slimmer Weeks in Treatment: 4 Edema Assessment Assessed: [Left: No] [Right: No] Edema: [Left: Ye] [Right: s] Calf Left: Right: Point of Measurement: 31 cm From Medial Instep 34.3 cm Ankle Left: Right: Point of Measurement: 9 cm From Medial Instep 29.3 cm Electronic Signature(s) Signed: 01/23/2022 3:53:04 PM By: Erenest Blank Signed: 01/23/2022 4:34:50 PM By: Lorrin Jackson Entered By: Erenest Blank on 01/23/2022 10:46:39 -------------------------------------------------------------------------------- Multi Wound Chart Details Patient Name: Date of Service: [name], DA Tyrone E. 01/23/2022 10:15 A M Medical Record Number: [id number] Patient Account Number: [id number] Date of Birth/Sex: Treating RN: 06-15-1940 (81 y.o. [name] Primary Care [name]: Dimas Chyle Other Clinician: Referring Siria Calandro: Treating Sharlet Notaro/Extender: Jessee Avers in Treatment: 4 Vital Signs Height(in): 72 Capillary Blood Glucose(mg/dl): 108 Weight(lbs): 198 Pulse(bpm): 42 Body Mass Index(BMI): 26.9 Blood Pressure(mmHg): 113/72 Temperature(F): 97.6 Respiratory Rate(breaths/min): 18 Photos: [N/A:N/A] Left, Plantar Foot N/A N/A Wound  Location: Gradually Appeared N/A N/A Wounding Event: Diabetic Wound/Ulcer of the Lower N/A N/A Primary Etiology: Extremity Arrhythmia, Congestive Heart Failure, N/A N/A Comorbid History: Coronary Artery Disease, Type II Diabetes, Osteoarthritis, Neuropathy 01/24/2021 N/A N/A Date Acquired: 4 N/A N/A Weeks of Treatment: Open N/A N/A Wound Status: No N/A N/A Wound Recurrence: 1.4x1.5x0.2 N/A N/A Measurements L x W x D (cm) 1.649 N/A N/A A (cm) : rea 0.33 N/A N/A Volume (cm) : -2.90% N/A N/A % Reduction in A rea: 31.40% N/A N/A % Reduction in  Volume: 2 Starting Position 1 (o'clock): 4 Ending Position 1 (o'clock): 0.1 Maximum Distance 1 (cm): Yes N/A N/A Undermining: Grade 1 N/A N/A Classification: Medium N/A N/A Exudate A mount: Serosanguineous N/A N/A Exudate Type: red, brown N/A N/A Exudate Color: Well defined, not attached N/A N/A Wound Margin: Large (67-100%) N/A N/A Granulation A mount: Red, Hyper-granulation N/A N/A Granulation Quality: None Present (0%) N/A N/A Necrotic A mount: Fat Layer (Subcutaneous Tissue): Yes N/A N/A Exposed Structures: Fascia: No Tendon: No Muscle: No Joint: No Bone: No None N/A N/A Epithelialization: Chemical Cauterization N/A N/A Procedures Performed: Treatment Notes Wound #1 (Foot) Wound Laterality: Plantar, Left Cleanser Soap and Water Discharge Instruction: May shower and wash wound with dial antibacterial soap and water prior to dressing change. Peri-Wound Care Topical Primary Dressing MediHoney Gel, tube 1.5 (oz) Discharge Instruction: Use until you get Regranex Secondary Dressing Bordered Gauze, 2x3.75 in Discharge Instruction: Apply over primary dressing as directed. Woven Gauze Sponges 2x2 in Discharge Instruction: Apply over primary dressing as directed. Secured With Compression Wrap Compression Stockings Environmental education officer) Signed: 01/23/2022 12:41:14 PM By: Kalman Shan  DO Signed: 01/23/2022 4:34:50 PM By: Fara Chute By: Kalman Shan on 01/23/2022 12:21:30 -------------------------------------------------------------------------------- Multi-Disciplinary Care Plan Details Patient Name: Date of Service: [name], DA Tyrone E. 01/23/2022 10:15 A M Medical Record Number: [id number] Patient Account Number: [id number] Date of Birth/Sex: Treating RN: 1940/09/15 (81 y.o. [name] Primary Care Lorynn Moeser: Other Clinician: Dimas Chyle Referring Quintrell Baze: Treating Kallin Henk/Extender: Jessee Avers in Treatment: 4 Active Inactive Wound/Skin Impairment Nursing Diagnoses: Impaired tissue integrity Goals: Patient/caregiver will verbalize understanding of skin care regimen Date Initiated: 12/26/2021 Target Resolution Date: 02/20/2022 Goal Status: Active Ulcer/skin breakdown will have a volume reduction of 30% by week 4 Date Initiated: 12/26/2021 Date Inactivated: 01/23/2022 Target Resolution Date: 01/23/2022 Goal Status: Met Ulcer/skin breakdown will have a volume reduction of 50% by week 8 Date Initiated: 01/23/2022 Target Resolution Date: 02/20/2022 Goal Status: Active Interventions: Assess patient/caregiver ability to obtain necessary supplies Assess patient/caregiver ability to perform ulcer/skin care regimen upon admission and as needed Assess ulceration(s) every visit Provide education on ulcer and skin care Treatment Activities: Topical wound management initiated : 12/26/2021 Notes: 01/23/22: Wound care regimen continues. Wound greater than 30% volume reduction, new goal initiated. Electronic Signature(s) Signed: 01/23/2022 10:57:16 AM By: Lorrin Jackson Entered By: Lorrin Jackson on 01/23/2022 10:57:16 -------------------------------------------------------------------------------- Pain Assessment Details Patient Name: Date of Service: [name] Tyrone E. 01/23/2022 10:15 A M Medical Record Number:  [id number] Patient Account Number: [id number] Date of Birth/Sex: Treating RN: 1940-12-28 (81 y.o. [name] Primary Care Corneisha Alvi: Dimas Chyle Other Clinician: Referring Coral Timme: Treating Keelynn Furgerson/Extender: Laurena Slimmer Weeks in Treatment: 4 Active Problems Location of Pain Severity and Description of Pain Patient Has Paino No Site Locations Pain Management and Medication Current Pain Management: Electronic Signature(s) Signed: 01/23/2022 3:53:04 PM By: Erenest Blank Signed: 01/23/2022 4:34:50 PM By: Lorrin Jackson Entered By: Erenest Blank on 01/23/2022 10:38:33 -------------------------------------------------------------------------------- Patient/Caregiver Education Details Patient Name: Date of Service: [name], DA Tyrone E. 8/29/2023andnbsp10:15 A M Medical Record Number: [id number] Patient Account Number: [id number] Date of Birth/Gender: Treating RN: 1940-09-06 (81 y.o. [name] Primary Care Physician: Dimas Chyle Other Clinician: Referring Physician: Treating Physician/Extender: Jessee Avers in Treatment: 4 Education Assessment Education Provided To: Patient Education Topics Provided Offloading: Methods: Explain/Verbal, Printed Responses: State content correctly Venous: Methods: Explain/Verbal, Printed Responses: State content correctly Wound/Skin Impairment: Methods: Demonstration, Explain/Verbal,  Printed Responses: State content correctly Electronic Signature(s) Signed: 01/23/2022 4:34:50 PM By: Lorrin Jackson Entered By: Lorrin Jackson on 01/23/2022 10:57:54 -------------------------------------------------------------------------------- Wound Assessment Details Patient Name: Date of Service: [name], DA Tyrone E. 01/23/2022 10:15 A M Medical Record Number: [id number] Patient Account Number: [id number] Date of Birth/Sex: Treating RN: 31-Aug-1940 (81 y.o. [name] Primary Care Evin Chirco:  Dimas Chyle Other Clinician: Referring Tarini Carrier: Treating Janah Mcculloh/Extender: Laurena Slimmer Weeks in Treatment: 4 Wound Status Wound Number: 1 Primary Diabetic Wound/Ulcer of the Lower Extremity Etiology: Wound Location: Left, Plantar Foot Wound Open Wounding Event: Gradually Appeared Status: Date Acquired: 01/24/2021 Comorbid Arrhythmia, Congestive Heart Failure, Coronary Artery Disease, Weeks Of Treatment: 4 History: Type II Diabetes, Osteoarthritis, Neuropathy Clustered Wound: No Photos Wound Measurements Length: (cm) 1.4 Width: (cm) 1.5 Depth: (cm) 0.2 Area: (cm) 1.649 Volume: (cm) 0.33 % Reduction in Area: -2.9% % Reduction in Volume: 31.4% Epithelialization: None Tunneling: No Undermining: Yes Starting Position (o'clock): 2 Ending Position (o'clock): 4 Maximum Distance: (cm) 0.1 Wound Description Classification: Grade 1 Wound Margin: Well defined, not attached Exudate Amount: Medium Exudate Type: Serosanguineous Exudate Color: red, brown Foul Odor After Cleansing: No Slough/Fibrino No Wound Bed Granulation Amount: Large (67-100%) Exposed Structure Granulation Quality: Red, Hyper-granulation Fascia Exposed: No Necrotic Amount: None Present (0%) Fat Layer (Subcutaneous Tissue) Exposed: Yes Tendon Exposed: No Muscle Exposed: No Joint Exposed: No Bone Exposed: No Treatment Notes Wound #1 (Foot) Wound Laterality: Plantar, Left Cleanser Soap and Water Discharge Instruction: May shower and wash wound with dial antibacterial soap and water prior to dressing change. Peri-Wound Care Topical Primary Dressing MediHoney Gel, tube 1.5 (oz) Discharge Instruction: Use until you get Regranex Secondary Dressing Bordered Gauze, 2x3.75 in Discharge Instruction: Apply over primary dressing as directed. Woven Gauze Sponges 2x2 in Discharge Instruction: Apply over primary dressing as directed. Secured With Compression Wrap Compression  Stockings Environmental education officer) Signed: 01/23/2022 4:34:50 PM By: Lorrin Jackson Entered By: Lorrin Jackson on 01/23/2022 11:18:58 -------------------------------------------------------------------------------- Vitals Details Patient Name: Date of Service: [name], DA Tyrone E. 01/23/2022 10:15 A M Medical Record Number: [id number] Patient Account Number: [id number] Date of Birth/Sex: Treating RN: 25-Mar-1941 (81 y.o. [name] Primary Care Taiki Buckwalter: Dimas Chyle Other Clinician: Referring Javien Tesch: Treating Ladina Shutters/Extender: Jessee Avers in Treatment: 4 Vital Signs Time Taken: 10:38 Temperature (F): 97.6 Height (in): 72 Pulse (bpm): 56 Weight (lbs): 198 Respiratory Rate (breaths/min): 18 Body Mass Index (BMI): 26.9 Blood Pressure (mmHg): 113/72 Capillary Blood Glucose (mg/dl): 108 Reference Range: 80 - 120 mg / dl Electronic Signature(s) Signed: 01/23/2022 3:53:04 PM By: Erenest Blank Entered By: Erenest Blank on 01/23/2022 10:38:27

## 2022-01-23 NOTE — Progress Notes (Signed)
DEEP, BONAWITZ ([id number]) Visit Report for 01/23/2022 Chief Complaint Document Details Patient Name: Date of Service: [name] 01/23/2022 10:15 A M Medical Record Number: [id number] Patient Account Number: [id number] Date of Birth/Sex: Treating RN: 24-May-1941 (81 y.o. [name] Primary Care Provider: Dimas Chyle Other Clinician: Referring Provider: Treating Provider/Extender: Laurena Slimmer Weeks in Treatment: 4 Information Obtained from: Patient Chief Complaint 12/26/2021; left foot plantar foot wound Electronic Signature(s) Signed: 01/23/2022 12:41:14 PM By: Kalman Shan DO Entered By: Kalman Shan on 01/23/2022 12:21:41 -------------------------------------------------------------------------------- HPI Details Patient Name: Date of Service: [name], [name] 01/23/2022 10:15 A M Medical Record Number: [id number] Patient Account Number: [id number] Date of Birth/Sex: Treating RN: January 08, 1941 (81 y.o. [name] Primary Care Provider: Dimas Chyle Other Clinician: Referring Provider: Treating Provider/Extender: Laurena Slimmer Weeks in Treatment: 4 History of Present Illness HPI Description: 12/26/2021 Mr. Raye Slyter is an 81 year old male with a past medical history of controlled type 2 diabetes, A-fib on Eliquis, chronic systolic heart failure on Entresto and right below knee amputation (2017) that presents to the clinic for a left plantar foot wound. He states this has been present for 1 year and is not sure how it started. He has peripheral neuropathy. He follows with Dr. Sherryle Lis, podiatry for this issue. He had an MRI on 10/16/2021 that did not show evidence of osteomyelitis. He has tried collagen with some benefit. Currently patient is keeping the area covered with a bandaid and no dressing. Due to his right BKA he has issues with balance and uses a cane. He is not a good candidate for the total contact cast.  Surgical exostectomy has been thought of for offloading however risk and recovery is high. Patient currently denies signs of infection. 8/15; patient presents for follow-up. He did not pick up her Granix from the pharmacy. He has been keeping the area covered. We discussed potentially doing a skin substitute and he was interested in this. He denies signs of infection. 8/29; patient presents for follow-up. He has been using Medihoney to the wound bed. He is not able to offload this area due to having a prosthesis. We have not heard back for insurance approval of a skin substitute. Electronic Signature(s) Signed: 01/23/2022 12:41:14 PM By: Kalman Shan DO Entered By: Kalman Shan on 01/23/2022 12:22:34 -------------------------------------------------------------------------------- Chemical Cauterization Details Patient Name: Date of Service: [name], [name] 01/23/2022 10:15 A M Medical Record Number: [id number] Patient Account Number: [id number] Date of Birth/Sex: Treating RN: 1941/03/05 (81 y.o. [name] Primary Care Provider: Dimas Chyle Other Clinician: Referring Provider: Treating Provider/Extender: Jessee Avers in Treatment: 4 Procedure Performed for: Wound #1 Left,Plantar Foot Performed By: Physician Kalman Shan, DO Post Procedure Diagnosis Same as Pre-procedure Electronic Signature(s) Signed: 01/23/2022 12:41:14 PM By: Kalman Shan DO Signed: 01/23/2022 4:34:50 PM By: Lorrin Jackson Entered By: Lorrin Jackson on 01/23/2022 11:17:58 -------------------------------------------------------------------------------- Physical Exam Details Patient Name: Date of Service: [name], [name] 01/23/2022 10:15 A M Medical Record Number: [id number] Patient Account Number: [id number] Date of Birth/Sex: Treating RN: 03-04-1941 (81 y.o. [name] Primary Care Provider: Dimas Chyle Other Clinician: Referring Provider: Treating  Provider/Extender: Laurena Slimmer Weeks in Treatment: 4 Constitutional respirations regular, non-labored and within target range for patient.. Cardiovascular 2+ dorsalis pedis/posterior tibialis pulses. Psychiatric pleasant and cooperative. Notes Left foot: T the plantar medial aspect there is an open wound with hyper granulated tissue. Minimal  undermining. No signs of surrounding infection. o Electronic Signature(s) Signed: 01/23/2022 12:41:14 PM By: Kalman Shan DO Entered By: Kalman Shan on 01/23/2022 12:23:16 -------------------------------------------------------------------------------- Physician Orders Details Patient Name: Date of Service: [name], [name] 01/23/2022 10:15 A M Medical Record Number: [id number] Patient Account Number: [id number] Date of Birth/Sex: Treating RN: [birth year] (81 y.o. [name] Primary Care Provider: Dimas Chyle Other Clinician: Referring Provider: Treating Provider/Extender: Jessee Avers in Treatment: 4 Verbal / Phone Orders: No Diagnosis Coding ICD-10 Coding Code Description E11.621 Type 2 diabetes mellitus with foot ulcer L97.522 Non-pressure chronic ulcer of other part of left foot with fat layer exposed E11.42 Type 2 diabetes mellitus with diabetic polyneuropathy V77.93 Chronic systolic (congestive) heart failure I48.0 Paroxysmal atrial fibrillation Z89.511 Acquired absence of right leg below knee Follow-up Appointments ppointment in 2 weeks. - 02/06/22 @ 12:30pm with Dr. Heber [place] and Leveda Anna, RN (Room 7) Return A Other: - Call Dr. Sherryle Lis at Podiatry to see about diabetic shoe or inserts. Bathing/ Shower/ Hygiene May shower and wash wound with soap and water. Edema Control - Lymphedema / SCD / Other Elevate legs to the level of the heart or above for 30 minutes daily and/or when sitting, a frequency of: - several times a day Avoid standing for long periods of time. Patient to  wear own compression stockings every day. Additional Orders / Instructions Follow Nutritious Diet - Monitor/Control Blood Sugars Wound Treatment Wound #1 - Foot Wound Laterality: Plantar, Left Cleanser: Soap and Water 1 x Per Day/30 Days Discharge Instructions: May shower and wash wound with dial antibacterial soap and water prior to dressing change. Prim Dressing: MediHoney Gel, tube 1.5 (oz) 1 x Per Day/30 Days ary Discharge Instructions: Use until you get Regranex Secondary Dressing: Bordered Gauze, 2x3.75 in 1 x Per Day/30 Days Discharge Instructions: Apply over primary dressing as directed. Secondary Dressing: Woven Gauze Sponges 2x2 in 1 x Per Day/30 Days Discharge Instructions: Apply over primary dressing as directed. Electronic Signature(s) Signed: 01/23/2022 12:41:14 PM By: Kalman Shan DO Entered By: Kalman Shan on 01/23/2022 12:23:24 -------------------------------------------------------------------------------- Problem List Details Patient Name: Date of Service: [name], [name] 01/23/2022 10:15 A M Medical Record Number: [id number] Patient Account Number: [id number] Date of Birth/Sex: Treating RN: 09-03-1940 (81 y.o. [name] Primary Care Provider: Dimas Chyle Other Clinician: Referring Provider: Treating Provider/Extender: Laurena Slimmer Weeks in Treatment: 4 Active Problems ICD-10 Encounter Code Description Active Date MDM Diagnosis E11.621 Type 2 diabetes mellitus with foot ulcer 12/26/2021 No Yes L97.522 Non-pressure chronic ulcer of other part of left foot with fat layer exposed 12/26/2021 No Yes E11.42 Type 2 diabetes mellitus with diabetic polyneuropathy 12/26/2021 No Yes A07.62 Chronic systolic (congestive) heart failure 12/26/2021 No Yes I48.0 Paroxysmal atrial fibrillation 12/26/2021 No Yes Z89.511 Acquired absence of right leg below knee 12/26/2021 No Yes Inactive Problems Resolved Problems Electronic Signature(s) Signed:  01/23/2022 12:41:14 PM By: Kalman Shan DO Previous Signature: 01/23/2022 10:54:57 AM Version By: Lorrin Jackson Entered By: Kalman Shan on 01/23/2022 12:21:15 -------------------------------------------------------------------------------- Progress Note Details Patient Name: Date of Service: [name], [name] 01/23/2022 10:15 A M Medical Record Number: [id number] Patient Account Number: [id number] Date of Birth/Sex: Treating RN: 1941-03-16 (81 y.o. [name] Primary Care Provider: Dimas Chyle Other Clinician: Referring Provider: Treating Provider/Extender: Jessee Avers in Treatment: 4 Subjective Chief Complaint Information obtained from Patient 12/26/2021; left foot plantar foot wound History of Present Illness (HPI) 12/26/2021 Mr. Quillian Quince  Pro[place]cal is an 81 year old male with a past medical history of controlled type 2 diabetes, A-fib on Eliquis, chronic systolic heart failure on Entresto and right below knee amputation (2017) that presents to the clinic for a left plantar foot wound. He states this has been present for 1 year and is not sure how it started. He has peripheral neuropathy. He follows with Dr. Sherryle Lis, podiatry for this issue. He had an MRI on 10/16/2021 that did not show evidence of osteomyelitis. He has tried collagen with some benefit. Currently patient is keeping the area covered with a bandaid and no dressing. Due to his right BKA he has issues with balance and uses a cane. He is not a good candidate for the total contact cast. Surgical exostectomy has been thought of for offloading however risk and recovery is high. Patient currently denies signs of infection. 8/15; patient presents for follow-up. He did not pick up her Granix from the pharmacy. He has been keeping the area covered. We discussed potentially doing a skin substitute and he was interested in this. He denies signs of infection. 8/29; patient presents for follow-up. He has  been using Medihoney to the wound bed. He is not able to offload this area due to having a prosthesis. We have not heard back for insurance approval of a skin substitute. Patient History Information obtained from Patient, Chart. Family History Heart Disease - Siblings, Hypertension - Siblings, Stroke - Father, No family history of Cancer, Diabetes, Hereditary Spherocytosis, Kidney Disease, Lung Disease, Seizures, Thyroid Problems, Tuberculosis. Social History Former smoker - Quit in 1983, Marital Status - Widowed, Alcohol Use - Rarely, Drug Use - No History, Caffeine Use - Rarely. Medical History Cardiovascular Patient has history of Arrhythmia, Congestive Heart Failure, Coronary Artery Disease Endocrine Patient has history of Type II Diabetes Musculoskeletal Patient has history of Osteoarthritis Neurologic Patient has history of Neuropathy Medical A Surgical History Notes nd Genitourinary Hx BPH Oncologic Basal Cell Carcinoma (Face) Objective Constitutional respirations regular, non-labored and within target range for patient.. Vitals Time Taken: 10:38 AM, Height: 72 in, Weight: 198 lbs, BMI: 26.9, Temperature: 97.6 F, Pulse: 56 bpm, Respiratory Rate: 18 breaths/min, Blood Pressure: 113/72 mmHg, Capillary Blood Glucose: 108 mg/dl. Cardiovascular 2+ dorsalis pedis/posterior tibialis pulses. Psychiatric pleasant and cooperative. General Notes: Left foot: T the plantar medial aspect there is an open wound with hyper granulated tissue. Minimal undermining. No signs of surrounding o infection. Integumentary (Hair, Skin) Wound #1 status is Open. Original cause of wound was Gradually Appeared. The date acquired was: 01/24/2021. The wound has been in treatment 4 weeks. The wound is located on the [place]. The wound measures 1.4cm length x 1.5cm width x 0.2cm depth; 1.649cm^2 area and 0.33cm^3 volume. There is Fat Layer (Subcutaneous Tissue) exposed. There is no tunneling  noted, however, there is undermining starting at 2:00 and ending at 4:00 with a maximum distance of 0.1cm. There is a medium amount of serosanguineous drainage noted. The wound margin is well defined and not attached to the wound base. There is large (67-100%) red, hyper - granulation within the wound bed. There is no necrotic tissue within the wound bed. Assessment Active Problems ICD-10 Type 2 diabetes mellitus with foot ulcer Non-pressure chronic ulcer of other part of left foot with fat layer exposed Type 2 diabetes mellitus with diabetic polyneuropathy Chronic systolic (congestive) heart failure Paroxysmal atrial fibrillation Acquired absence of right leg below knee Patient's wound has shown and proven in size and appearance since last clinic visit.  A silver nitrate to the hyper granulated areas. I recommended continuing Medihoney. Follow-up in 2 weeks. Continue offloading foam donut to the wound bed. Procedures Wound #1 Pre-procedure diagnosis of Wound #1 is a Diabetic Wound/Ulcer of the Lower Extremity located on the Left,Plantar Foot . An Chemical Cauterization procedure was performed by Kalman Shan, DO. Post procedure Diagnosis Wound #1: Same as Pre-Procedure Plan Follow-up Appointments: Return Appointment in 2 weeks. - 02/06/22 @ 12:30pm with Dr. Heber [place] and Leveda Anna, RN (Room 7) Other: - Call Dr. Sherryle Lis at Podiatry to see about diabetic shoe or inserts. Bathing/ Shower/ Hygiene: May shower and wash wound with soap and water. Edema Control - Lymphedema / SCD / Other: Elevate legs to the level of the heart or above for 30 minutes daily and/or when sitting, a frequency of: - several times a day Avoid standing for long periods of time. Patient to wear own compression stockings every day. Additional Orders / Instructions: Follow Nutritious Diet - Monitor/Control Blood Sugars WOUND #1: - Foot Wound Laterality: Plantar, Left Cleanser: Soap and Water 1 x Per Day/30  Days Discharge Instructions: May shower and wash wound with dial antibacterial soap and water prior to dressing change. Prim Dressing: MediHoney Gel, tube 1.5 (oz) 1 x Per Day/30 Days ary Discharge Instructions: Use until you get Regranex Secondary Dressing: Bordered Gauze, 2x3.75 in 1 x Per Day/30 Days Discharge Instructions: Apply over primary dressing as directed. Secondary Dressing: Woven Gauze Sponges 2x2 in 1 x Per Day/30 Days Discharge Instructions: Apply over primary dressing as directed. 1. Silver nitrate 2. Medihoney 3. Offloadingoofoam donut 4. Follow-up in 2 weeks Electronic Signature(s) Signed: 01/23/2022 12:41:14 PM By: Kalman Shan DO Entered By: Kalman Shan on 01/23/2022 12:24:43 -------------------------------------------------------------------------------- HxROS Details Patient Name: Date of Service: [name], [name] 01/23/2022 10:15 A M Medical Record Number: [id number] Patient Account Number: [id number] Date of Birth/Sex: Treating RN: March 05, 1941 (81 y.o. [name] Primary Care Provider: Dimas Chyle Other Clinician: Referring Provider: Treating Provider/Extender: Jessee Avers in Treatment: 4 Information Obtained From Patient Chart Cardiovascular Medical History: Positive for: Arrhythmia; Congestive Heart Failure; Coronary Artery Disease Endocrine Medical History: Positive for: Type II Diabetes Treated with: Oral agents Genitourinary Medical History: Past Medical History Notes: Hx BPH Musculoskeletal Medical History: Positive for: Osteoarthritis Neurologic Medical History: Positive for: Neuropathy Oncologic Medical History: Past Medical History Notes: Basal Cell Carcinoma (Face) Immunizations Pneumococcal Vaccine: Received Pneumococcal Vaccination: Yes Received Pneumococcal Vaccination On or After 60th Birthday: Yes Implantable Devices None Family and Social History Cancer: No; Diabetes: No;  Heart Disease: Yes - Siblings; Hereditary Spherocytosis: No; Hypertension: Yes - Siblings; Kidney Disease: No; Lung Disease: No; Seizures: No; Stroke: Yes - Father; Thyroid Problems: No; Tuberculosis: No; Former smoker - Quit in 1983; Marital Status - Widowed; Alcohol Use: Rarely; Drug Use: No History; Caffeine Use: Rarely; Financial Concerns: No; Food, Clothing or Shelter Needs: No; Support System Lacking: No; Transportation Concerns: No Electronic Signature(s) Signed: 01/23/2022 12:41:14 PM By: Kalman Shan DO Signed: 01/23/2022 4:34:50 PM By: Lorrin Jackson Entered By: Kalman Shan on 01/23/2022 12:22:40 -------------------------------------------------------------------------------- [place] Details Patient Name: Date of Service: [name] 01/23/2022 Medical Record Number: [id number] Patient Account Number: [id number] Date of Birth/Sex: Treating RN: 1940-06-07 (81 y.o. [name] Primary Care Provider: Dimas Chyle Other Clinician: Referring Provider: Treating Provider/Extender: Laurena Slimmer Weeks in Treatment: 4 Diagnosis Coding ICD-10 Codes Code Description [phone] Type 2 diabetes mellitus with foot ulcer L97.522 Non-pressure chronic ulcer of other  part of left foot with fat layer exposed E11.42 Type 2 diabetes mellitus with diabetic polyneuropathy L84.53 Chronic systolic (congestive) heart failure I48.0 Paroxysmal atrial fibrillation Z89.511 Acquired absence of right leg below knee Facility Procedures CPT4 Code: [id number] Description: 22482 - CHEM CAUT GRANULATION TISS ICD-10 Diagnosis Description L97.522 Non-pressure chronic ulcer of other part of left foot with fat layer exposed E11.621 Type 2 diabetes mellitus with foot ulcer Modifier: Quantity: 1 Physician Procedures : CPT4 Code Description Modifier [id number] - WC PHYS CHEM CAUT GRAN TISSUE ICD-10 Diagnosis Description L97.522 Non-pressure chronic ulcer of other part of left  foot with fat layer exposed E11.621 Type 2 diabetes mellitus with foot ulcer Quantity: 1 Electronic Signature(s) Signed: 01/23/2022 12:41:14 PM By: Kalman Shan DO Entered By: Kalman Shan on 01/23/2022 12:24:55

## 2022-01-24 DIAGNOSIS — Z961 Presence of intraocular lens: Secondary | ICD-10-CM | POA: Diagnosis not present

## 2022-01-24 DIAGNOSIS — H524 Presbyopia: Secondary | ICD-10-CM | POA: Diagnosis not present

## 2022-01-24 DIAGNOSIS — E119 Type 2 diabetes mellitus without complications: Secondary | ICD-10-CM | POA: Diagnosis not present

## 2022-01-30 ENCOUNTER — Encounter (HOSPITAL_COMMUNITY): Payer: Self-pay

## 2022-01-30 ENCOUNTER — Ambulatory Visit (HOSPITAL_COMMUNITY): Admission: RE | Admit: 2022-01-30 | Payer: Medicare Other | Source: Ambulatory Visit

## 2022-02-06 ENCOUNTER — Encounter (HOSPITAL_BASED_OUTPATIENT_CLINIC_OR_DEPARTMENT_OTHER): Payer: Medicare Other | Attending: Internal Medicine | Admitting: Internal Medicine

## 2022-02-06 DIAGNOSIS — E11621 Type 2 diabetes mellitus with foot ulcer: Secondary | ICD-10-CM | POA: Diagnosis not present

## 2022-02-06 DIAGNOSIS — I480 Paroxysmal atrial fibrillation: Secondary | ICD-10-CM | POA: Insufficient documentation

## 2022-02-06 DIAGNOSIS — Z87891 Personal history of nicotine dependence: Secondary | ICD-10-CM | POA: Insufficient documentation

## 2022-02-06 DIAGNOSIS — Z7901 Long term (current) use of anticoagulants: Secondary | ICD-10-CM | POA: Diagnosis not present

## 2022-02-06 DIAGNOSIS — E1142 Type 2 diabetes mellitus with diabetic polyneuropathy: Secondary | ICD-10-CM | POA: Insufficient documentation

## 2022-02-06 DIAGNOSIS — I5022 Chronic systolic (congestive) heart failure: Secondary | ICD-10-CM | POA: Diagnosis not present

## 2022-02-06 DIAGNOSIS — L97522 Non-pressure chronic ulcer of other part of left foot with fat layer exposed: Secondary | ICD-10-CM | POA: Diagnosis not present

## 2022-02-06 DIAGNOSIS — Z89511 Acquired absence of right leg below knee: Secondary | ICD-10-CM | POA: Insufficient documentation

## 2022-02-06 NOTE — Progress Notes (Signed)
HILDRED, MOLLICA ([id number]) Visit Report for 02/06/2022 Arrival Information Details Patient Name: Date of Service: [name] 02/06/2022 12:30 PM Medical Record Number: [id number] Patient Account Number: [id number] Date of Birth/Sex: Treating RN: 05/10/1941 (81 y.o. [name] Primary Care Nivan Melendrez: Dimas Chyle Other Clinician: Referring Hendy Brindle: Treating Isami Mehra/Extender: Jessee Avers in Treatment: 6 Visit Information History Since Last Visit Added or deleted any medications: No Patient Arrived: [name] Any new allergies or adverse reactions: No Arrival Time: 12:38 Had a fall or experienced change in No Transfer Assistance: None activities of daily living that may affect Patient Identification Verified: Yes risk of falls: Secondary Verification Process Completed: Yes Signs or symptoms of abuse/neglect since last visito No Patient Requires Transmission-Based Precautions: No Hospitalized since last visit: No Patient Has Alerts: Yes Implantable device outside of the clinic excluding No Patient Alerts: Patient on Blood Thinner cellular tissue based products placed in the center ABI: R=Non Comp since last visit: Has Dressing in Place as Prescribed: Yes Has Compression in Place as Prescribed: Yes Pain Present Now: No Electronic Signature(s) Signed: 02/06/2022 4:35:55 PM By: Lorrin Jackson Entered By: Lorrin Jackson on 02/06/2022 12:40:33 -------------------------------------------------------------------------------- Encounter Discharge Information Details Patient Name: Date of Service: [name], Tyrone [name] 02/06/2022 12:30 PM Medical Record Number: [id number] Patient Account Number: [id number] Date of Birth/Sex: Treating RN: [birth year] (81 y.o. [name] Primary Care Lailana Shira: Dimas Chyle Other Clinician: Referring Aser Nylund: Treating Tamanika Heiney/Extender: Jessee Avers in Treatment: 6 Encounter Discharge  Information Items Discharge Condition: Stable Ambulatory Status: Cane Discharge Destination: Home Transportation: Private Auto Schedule Follow-up Appointment: Yes Clinical Summary of Care: Provided on 02/06/2022 Form Type Recipient Paper Patient Patient Electronic Signature(s) Signed: 02/06/2022 4:35:55 PM By: Lorrin Jackson Entered By: Lorrin Jackson on 02/06/2022 13:09:27 -------------------------------------------------------------------------------- Lower Extremity Assessment Details Patient Name: Date of Service: [name], Tyrone [name] 02/06/2022 12:30 PM Medical Record Number: [id number] Patient Account Number: [id number] Date of Birth/Sex: Treating RN: 06/13/1940 (81 y.o. [name] Primary Care Wednesday [name]: Dimas Chyle Other Clinician: Referring [place] Latulippe: Treating Rafel Garde/Extender: Laurena Slimmer Weeks in Treatment: 6 Edema Assessment Assessed: [Left: Yes] [Right: No] Edema: [Left: Ye] [Right: s] Calf Left: Right: Point of Measurement: 31 cm From Medial Instep 32.6 cm Ankle Left: Right: Point of Measurement: 9 cm From Medial Instep 29 cm Vascular Assessment Pulses: Dorsalis Pedis Palpable: [Left:Yes] Electronic Signature(s) Signed: 02/06/2022 4:35:55 PM By: Lorrin Jackson Entered By: Lorrin Jackson on 02/06/2022 12:46:14 -------------------------------------------------------------------------------- Multi Wound Chart Details Patient Name: Date of Service: [name], Tyrone [name] 02/06/2022 12:30 PM Medical Record Number: [id number] Patient Account Number: [id number] Date of Birth/Sex: Treating RN: [birth year] (81 y.o. M) Primary Care Yunis Voorheis: Dimas Chyle Other Clinician: Referring Brynnan Rodenbaugh: Treating Minta Fair/Extender: Jessee Avers in Treatment: 6 Vital Signs Height(in): 72 Capillary Blood Glucose(mg/dl): 99 Weight(lbs): 198 Pulse(bpm): 56 Body Mass Index(BMI): 26.9 Blood Pressure(mmHg): 104/65 Temperature(F):  98.1 Respiratory Rate(breaths/min): 18 Photos: [1:Left, Plantar Foot] [N/A:N/A N/A] Wound Location: [1:Gradually Appeared] [N/A:N/A] Wounding Event: [1:Diabetic Wound/Ulcer of the Lower] [N/A:N/A] Primary Etiology: [1:Extremity Arrhythmia, Congestive Heart Failure, N/A] Comorbid History: [1:Coronary Artery Disease, Type II Diabetes, Osteoarthritis, Neuropathy 01/24/2021] [N/A:N/A] Date Acquired: [1:6] [N/A:N/A] Weeks of Treatment: [1:Open] [N/A:N/A] Wound Status: [1:No] [N/A:N/A] Wound Recurrence: [1:1.2x1.8x0.2] [N/A:N/A] Measurements L x W x D (cm) [1:1.696] [N/A:N/A] A (cm) : rea [1:0.339] [N/A:N/A] Volume (cm) : [1:-5.90%] [N/A:N/A] % Reduction in A rea: [1:29.50%] [N/A:N/A] % Reduction in Volume: [1:Grade 1] [N/A:N/A] Classification: [1:Medium] [  N/A:N/A] Exudate A mount: [1:Serosanguineous] [N/A:N/A] Exudate Type: [1:red, brown] [N/A:N/A] Exudate Color: [1:Well defined, not attached] [N/A:N/A] Wound Margin: [1:Large (67-100%)] [N/A:N/A] Granulation A mount: [1:Red, Hyper-granulation] [N/A:N/A] Granulation Quality: [1:None Present (0%)] [N/A:N/A] Necrotic A mount: [1:Fat Layer (Subcutaneous Tissue): Yes N/A] Exposed Structures: [1:Fascia: No Tendon: No Muscle: No Joint: No Bone: No None] [N/A:N/A] Epithelialization: [1:Calloused Periwound] [N/A:N/A] Assessment Notes: [1:Chemical Cauterization] [N/A:N/A] Treatment Notes Wound #1 (Foot) Wound Laterality: Plantar, Left Cleanser Soap and Water Discharge Instruction: May shower and wash wound with dial antibacterial soap and water prior to dressing change. Peri-Wound Care Topical Primary Dressing MediHoney Gel, tube 1.5 (oz) Discharge Instruction: Use until you get Regranex Secondary Dressing Bordered Gauze, 2x3.75 in Discharge Instruction: Apply over primary dressing as directed. Optifoam Non-Adhesive Dressing, 4x4 in Discharge Instruction: Apply as donut Woven Gauze Sponges 2x2 in Discharge Instruction: Apply over  primary dressing as directed. Secured With Compression Wrap Compression Stockings Add-Ons Electronic Signature(s) Signed: 02/06/2022 2:17:28 PM By: Kalman Shan DO Entered By: Kalman Shan on 02/06/2022 13:28:38 -------------------------------------------------------------------------------- Multi-Disciplinary Care Plan Details Patient Name: Date of Service: [name], Tyrone [name] 02/06/2022 12:30 PM Medical Record Number: [id number] Patient Account Number: [id number] Date of Birth/Sex: Treating RN: Nov 11, 1940 (81 y.o. [name] Primary Care Aunika Kirsten: Dimas Chyle Other Clinician: Referring Jazper Nikolai: Treating Modestine Scherzinger/Extender: Jessee Avers in Treatment: 6 Active Inactive Wound/Skin Impairment Nursing Diagnoses: Impaired tissue integrity Goals: Patient/caregiver will verbalize understanding of skin care regimen Date Initiated: 12/26/2021 Target Resolution Date: 02/20/2022 Goal Status: Active Ulcer/skin breakdown will have a volume reduction of 30% by week 4 Date Initiated: 12/26/2021 Date Inactivated: 01/23/2022 Target Resolution Date: 01/23/2022 Goal Status: Met Ulcer/skin breakdown will have a volume reduction of 50% by week 8 Date Initiated: 01/23/2022 Target Resolution Date: 02/20/2022 Goal Status: Active Interventions: Assess patient/caregiver ability to obtain necessary supplies Assess patient/caregiver ability to perform ulcer/skin care regimen upon admission and as needed Assess ulceration(s) every visit Provide education on ulcer and skin care Treatment Activities: Topical wound management initiated : 12/26/2021 Notes: 01/23/22: Wound care regimen continues. Wound greater than 30% volume reduction, new goal initiated. Electronic Signature(s) Signed: 02/06/2022 4:35:55 PM By: Lorrin Jackson Entered By: Lorrin Jackson on 02/06/2022 12:37:48 -------------------------------------------------------------------------------- Pain Assessment  Details Patient Name: Date of Service: [name], Tyrone [name] 02/06/2022 12:30 PM Medical Record Number: [id number] Patient Account Number: [id number] Date of Birth/Sex: Treating RN: 1940/08/04 (81 y.o. [name] Primary Care Librado Guandique: Dimas Chyle Other Clinician: Referring Leeloo Silverthorne: Treating Kamaal Cast/Extender: Laurena Slimmer Weeks in Treatment: 6 Active Problems Location of Pain Severity and Description of Pain Patient Has Paino No Site Locations Pain Management and Medication Current Pain Management: Electronic Signature(s) Signed: 02/06/2022 4:35:55 PM By: Lorrin Jackson Entered By: Lorrin Jackson on 02/06/2022 12:45:00 -------------------------------------------------------------------------------- Patient/Caregiver Education Details Patient Name: Date of Service: [name]. 9/12/2023andnbsp12:30 PM Medical Record Number: [id number] Patient Account Number: [id number] Date of Birth/Gender: Treating RN: 09/21/1940 (81 y.o. [name] Primary Care Physician: Dimas Chyle Other Clinician: Referring Physician: Treating Physician/Extender: Jessee Avers in Treatment: 6 Education Assessment Education Provided To: Patient Education Topics Provided Venous: Methods: Explain/Verbal, Printed Responses: State content correctly Wound/Skin Impairment: Methods: Explain/Verbal, Printed Responses: State content correctly Electronic Signature(s) Signed: 02/06/2022 4:35:55 PM By: Lorrin Jackson Entered By: Lorrin Jackson on 02/06/2022 12:38:08 -------------------------------------------------------------------------------- Wound Assessment Details Patient Name: Date of Service: [name], Tyrone [name] 02/06/2022 12:30 PM Medical Record Number: [id number] Patient Account Number: [id number] Date of  Birth/Sex: Treating RN: [birth year] (81 y.o. [name] Primary Care Braydan Marriott: Dimas Chyle Other Clinician: Referring  Daja Shuping: Treating Beola Vasallo/Extender: Laurena Slimmer Weeks in Treatment: 6 Wound Status Wound Number: 1 Primary Diabetic Wound/Ulcer of the Lower Extremity Etiology: Wound Location: Left, Plantar Foot Wound Open Wounding Event: Gradually Appeared Status: Date Acquired: 01/24/2021 Comorbid Arrhythmia, Congestive Heart Failure, Coronary Artery Disease, Weeks Of Treatment: 6 History: Type II Diabetes, Osteoarthritis, Neuropathy Clustered Wound: No Photos Wound Measurements Length: (cm) 1.2 Width: (cm) 1.8 Depth: (cm) 0.2 Area: (cm) 1.696 Volume: (cm) 0.339 % Reduction in Area: -5.9% % Reduction in Volume: 29.5% Epithelialization: None Tunneling: No Undermining: No Wound Description Classification: Grade 1 Wound Margin: Well defined, not attached Exudate Amount: Medium Exudate Type: Serosanguineous Exudate Color: red, brown Foul Odor After Cleansing: No Slough/Fibrino No Wound Bed Granulation Amount: Large (67-100%) Exposed Structure Granulation Quality: Red, Hyper-granulation Fascia Exposed: No Necrotic Amount: None Present (0%) Fat Layer (Subcutaneous Tissue) Exposed: Yes Tendon Exposed: No Muscle Exposed: No Joint Exposed: No Bone Exposed: No Assessment Notes Calloused Periwound Treatment Notes Wound #1 (Foot) Wound Laterality: Plantar, Left Cleanser Soap and Water Discharge Instruction: May shower and wash wound with dial antibacterial soap and water prior to dressing change. Peri-Wound Care Topical Primary Dressing MediHoney Gel, tube 1.5 (oz) Discharge Instruction: Use until you get Regranex Secondary Dressing Bordered Gauze, 2x3.75 in Discharge Instruction: Apply over primary dressing as directed. Optifoam Non-Adhesive Dressing, 4x4 in Discharge Instruction: Apply as donut Woven Gauze Sponges 2x2 in Discharge Instruction: Apply over primary dressing as directed. Secured With Compression Wrap Compression  Stockings Environmental education officer) Signed: 02/06/2022 4:35:55 PM By: Lorrin Jackson Entered By: Lorrin Jackson on 02/06/2022 12:48:41 -------------------------------------------------------------------------------- Vitals Details Patient Name: Date of Service: [name], Tyrone [name] 02/06/2022 12:30 PM Medical Record Number: [id number] Patient Account Number: [id number] Date of Birth/Sex: Treating RN: 12/24/1940 (81 y.o. [name] Primary Care Keltin Baird: Dimas Chyle Other Clinician: Referring Collier Bohnet: Treating Clarabell Matsuoka/Extender: Jessee Avers in Treatment: 6 Vital Signs Time Taken: 12:40 Temperature (F): 98.1 Height (in): 72 Pulse (bpm): 55 Weight (lbs): 198 Respiratory Rate (breaths/min): 18 Body Mass Index (BMI): 26.9 Blood Pressure (mmHg): 104/65 Capillary Blood Glucose (mg/dl): 99 Reference Range: 80 - 120 mg / dl Electronic Signature(s) Signed: 02/06/2022 4:35:55 PM By: Lorrin Jackson Entered By: Lorrin Jackson on 02/06/2022 12:41:53

## 2022-02-06 NOTE — Progress Notes (Signed)
[facility] ([id number]) Visit Report for 02/06/2022 Chief Complaint Document Details Patient Name: Date of Service: [name] 02/06/2022 12:30 PM Medical Record Number: [id number] Patient Account Number: [id number] Date of Birth/Sex: Treating RN: January 05, 1941 (81 y.o. M) Primary Care Provider: Dimas Chyle Other Clinician: Referring Provider: Treating Provider/Extender: Laurena Slimmer Weeks in Treatment: 6 Information Obtained from: Patient Chief Complaint 12/26/2021; left foot plantar foot wound Electronic Signature(s) Signed: 02/06/2022 2:17:28 PM By: Kalman Shan DO Entered By: Kalman Shan on 02/06/2022 13:29:11 -------------------------------------------------------------------------------- HPI Details Patient Name: Date of Service: [name], Tyrone [name] 02/06/2022 12:30 PM Medical Record Number: [id number] Patient Account Number: [id number] Date of Birth/Sex: Treating RN: Jul 29, 1940 (81 y.o. M) Primary Care Provider: Dimas Chyle Other Clinician: Referring Provider: Treating Provider/Extender: Jessee Avers in Treatment: 6 History of Present Illness HPI Description: 12/26/2021 Mr. Arjen Deringer is an 81 year old male with a past medical history of controlled type 2 diabetes, A-fib on Eliquis, chronic systolic heart failure on Entresto and right below knee amputation (2017) that presents to the clinic for a left plantar foot wound. He states this has been present for 1 year and is not sure how it started. He has peripheral neuropathy. He follows with Dr. Sherryle Lis, podiatry for this issue. He had an MRI on 10/16/2021 that did not show evidence of osteomyelitis. He has tried collagen with some benefit. Currently patient is keeping the area covered with a bandaid and no dressing. Due to his right BKA he has issues with balance and uses a cane. He is not a good candidate for the total contact cast. Surgical exostectomy has been thought  of for offloading however risk and recovery is high. Patient currently denies signs of infection. 8/15; patient presents for follow-up. He did not pick up her Granix from the pharmacy. He has been keeping the area covered. We discussed potentially doing a skin substitute and he was interested in this. He denies signs of infection. 8/29; patient presents for follow-up. He has been using Medihoney to the wound bed. He is not able to offload this area due to having a prosthesis. We have not heard back for insurance approval of a skin substitute. 9/12; patient presents for follow-up. We have been using Medihoney to the wound bed. He has no issues or complaints today. He has been approved for Grafix. We discussed using this at next clinic visit and he was agreeable. Electronic Signature(s) Signed: 02/06/2022 2:17:28 PM By: Kalman Shan DO Entered By: Kalman Shan on 02/06/2022 13:31:09 -------------------------------------------------------------------------------- Chemical Cauterization Details Patient Name: Date of Service: [name], Tyrone [name] 02/06/2022 12:30 PM Medical Record Number: [id number] Patient Account Number: [id number] Date of Birth/Sex: Treating RN: 1941-05-14 (81 y.o. [name] Primary Care Provider: Dimas Chyle Other Clinician: Referring Provider: Treating Provider/Extender: Jessee Avers in Treatment: 6 Procedure Performed for: Wound #1 Left,Plantar Foot Performed By: Physician Kalman Shan, DO Post Procedure Diagnosis Same as Pre-procedure Electronic Signature(s) Signed: 02/06/2022 2:17:28 PM By: Kalman Shan DO Signed: 02/06/2022 4:35:55 PM By: Lorrin Jackson Entered By: Lorrin Jackson on 02/06/2022 12:55:42 -------------------------------------------------------------------------------- Physical Exam Details Patient Name: Date of Service: [name], Tyrone [name] 02/06/2022 12:30 PM Medical Record Number: [id number] Patient Account  Number: [id number] Date of Birth/Sex: Treating RN: 02-13-1941 (81 y.o. M) Primary Care Provider: Dimas Chyle Other Clinician: Referring Provider: Treating Provider/Extender: Laurena Slimmer Weeks in Treatment: 6 Constitutional respirations regular, non-labored and within target range for  patient.. Cardiovascular 2+ dorsalis pedis/posterior tibialis pulses. Psychiatric pleasant and cooperative. Notes Left foot: T the plantar medial aspect there is an open wound with hyper granulated tissue. No signs of surrounding infection. o Electronic Signature(s) Signed: 02/06/2022 2:17:28 PM By: Kalman Shan DO Entered By: Kalman Shan on 02/06/2022 13:31:39 -------------------------------------------------------------------------------- Physician Orders Details Patient Name: Date of Service: [name], Tyrone [name] 02/06/2022 12:30 PM Medical Record Number: [id number] Patient Account Number: [id number] Date of Birth/Sex: Treating RN: 18-Nov-1940 (81 y.o. [name] Primary Care Provider: Dimas Chyle Other Clinician: Referring Provider: Treating Provider/Extender: Jessee Avers in Treatment: 6 Verbal / Phone Orders: No Diagnosis Coding ICD-10 Coding Code Description E11.621 Type 2 diabetes mellitus with foot ulcer L97.522 Non-pressure chronic ulcer of other part of left foot with fat layer exposed E11.42 Type 2 diabetes mellitus with diabetic polyneuropathy P38.25 Chronic systolic (congestive) heart failure I48.0 Paroxysmal atrial fibrillation Z89.511 Acquired absence of right leg below knee Follow-up Appointments ppointment in 1 week. - with Dr. Heber Hanford and Leveda Anna, RN (Room 7) Return A Other: - Call Dr. Sherryle Lis at Podiatry to see about diabetic shoe or inserts. Anesthetic (In clinic) Topical Lidocaine 5% applied to wound bed (In clinic) Topical Lidocaine 4% applied to wound bed Cellular or Tissue Based Products Other Cellular or Tissue  Based Products Orders/Instructions: - Will order Grafix Bathing/ Shower/ Hygiene May shower and wash wound with soap and water. Edema Control - Lymphedema / SCD / Other Elevate legs to the level of the heart or above for 30 minutes daily and/or when sitting, a frequency of: - several times a day Avoid standing for long periods of time. Patient to wear own compression stockings every day. Additional Orders / Instructions Follow Nutritious Diet - Monitor/Control Blood Sugars Wound Treatment Wound #1 - Foot Wound Laterality: Plantar, Left Cleanser: Soap and Water 1 x Per Day/30 Days Discharge Instructions: May shower and wash wound with dial antibacterial soap and water prior to dressing change. Prim Dressing: MediHoney Gel, tube 1.5 (oz) 1 x Per Day/30 Days ary Discharge Instructions: Use until you get Regranex Secondary Dressing: Bordered Gauze, 2x3.75 in 1 x Per Day/30 Days Discharge Instructions: Apply over primary dressing as directed. Secondary Dressing: Optifoam Non-Adhesive Dressing, 4x4 in 1 x Per Day/30 Days Discharge Instructions: Apply as donut Secondary Dressing: Woven Gauze Sponges 2x2 in 1 x Per Day/30 Days Discharge Instructions: Apply over primary dressing as directed. Electronic Signature(s) Signed: 02/06/2022 2:17:28 PM By: Kalman Shan DO Entered By: Kalman Shan on 02/06/2022 13:31:51 -------------------------------------------------------------------------------- Problem List Details Patient Name: Date of Service: [name], Tyrone [name] 02/06/2022 12:30 PM Medical Record Number: [id number] Patient Account Number: [id number] Date of Birth/Sex: Treating RN: February 03, 1941 (81 y.o. [name] Primary Care Provider: Dimas Chyle Other Clinician: Referring Provider: Treating Provider/Extender: Laurena Slimmer Weeks in Treatment: 6 Active Problems ICD-10 Encounter Code Description Active Date MDM Diagnosis E11.621 Type 2 diabetes mellitus  with foot ulcer 12/26/2021 No Yes L97.522 Non-pressure chronic ulcer of other part of left foot with fat layer exposed 12/26/2021 No Yes E11.42 Type 2 diabetes mellitus with diabetic polyneuropathy 12/26/2021 No Yes L93.79 Chronic systolic (congestive) heart failure 12/26/2021 No Yes I48.0 Paroxysmal atrial fibrillation 12/26/2021 No Yes Z89.511 Acquired absence of right leg below knee 12/26/2021 No Yes Inactive Problems Resolved Problems Electronic Signature(s) Signed: 02/06/2022 2:17:28 PM By: Kalman Shan DO Entered By: Kalman Shan on 02/06/2022 13:28:32 -------------------------------------------------------------------------------- Progress Note Details Patient Name: Date of Service: [name], Tyrone  [name] 02/06/2022 12:30 PM Medical Record Number: [id number] Patient Account Number: [id number] Date of Birth/Sex: Treating RN: 02/11/1941 (81 y.o. M) Primary Care Provider: Dimas Chyle Other Clinician: Referring Provider: Treating Provider/Extender: Jessee Avers in Treatment: 6 Subjective Chief Complaint Information obtained from Patient 12/26/2021; left foot plantar foot wound History of Present Illness (HPI) 12/26/2021 Mr. Niccolas Loeper is an 81 year old male with a past medical history of controlled type 2 diabetes, A-fib on Eliquis, chronic systolic heart failure on Entresto and right below knee amputation (2017) that presents to the clinic for a left plantar foot wound. He states this has been present for 1 year and is not sure how it started. He has peripheral neuropathy. He follows with Dr. Sherryle Lis, podiatry for this issue. He had an MRI on 10/16/2021 that did not show evidence of osteomyelitis. He has tried collagen with some benefit. Currently patient is keeping the area covered with a bandaid and no dressing. Due to his right BKA he has issues with balance and uses a cane. He is not a good candidate for the total contact cast. Surgical exostectomy has been thought  of for offloading however risk and recovery is high. Patient currently denies signs of infection. 8/15; patient presents for follow-up. He did not pick up her Granix from the pharmacy. He has been keeping the area covered. We discussed potentially doing a skin substitute and he was interested in this. He denies signs of infection. 8/29; patient presents for follow-up. He has been using Medihoney to the wound bed. He is not able to offload this area due to having a prosthesis. We have not heard back for insurance approval of a skin substitute. 9/12; patient presents for follow-up. We have been using Medihoney to the wound bed. He has no issues or complaints today. He has been approved for Grafix. We discussed using this at next clinic visit and he was agreeable. Patient History Information obtained from Patient, Chart. Family History Heart Disease - Siblings, Hypertension - Siblings, Stroke - Father, No family history of Cancer, Diabetes, Hereditary Spherocytosis, Kidney Disease, Lung Disease, Seizures, Thyroid Problems, Tuberculosis. Social History Former smoker - Quit in 1983, Marital Status - Widowed, Alcohol Use - Rarely, Drug Use - No History, Caffeine Use - Rarely. Medical History Cardiovascular Patient has history of Arrhythmia, Congestive Heart Failure, Coronary Artery Disease Endocrine Patient has history of Type II Diabetes Musculoskeletal Patient has history of Osteoarthritis Neurologic Patient has history of Neuropathy Medical A Surgical History Notes nd Genitourinary Hx BPH Oncologic Basal Cell Carcinoma (Face) Objective Constitutional respirations regular, non-labored and within target range for patient.. Vitals Time Taken: 12:40 PM, Height: 72 in, Weight: 198 lbs, BMI: 26.9, Temperature: 98.1 F, Pulse: 55 bpm, Respiratory Rate: 18 breaths/min, Blood Pressure: 104/65 mmHg, Capillary Blood Glucose: 99 mg/dl. Cardiovascular 2+ dorsalis pedis/posterior tibialis  pulses. Psychiatric pleasant and cooperative. General Notes: Left foot: T the plantar medial aspect there is an open wound with hyper granulated tissue. No signs of surrounding infection. o Integumentary (Hair, Skin) Wound #1 status is Open. Original cause of wound was Gradually Appeared. The date acquired was: 01/24/2021. The wound has been in treatment 6 weeks. The wound is located on the [place]. The wound measures 1.2cm length x 1.8cm width x 0.2cm depth; 1.696cm^2 area and 0.339cm^3 volume. There is Fat Layer (Subcutaneous Tissue) exposed. There is no tunneling or undermining noted. There is a medium amount of serosanguineous drainage noted. The wound margin is well defined and not attached to  the wound base. There is large (67-100%) red, hyper - granulation within the wound bed. There is no necrotic tissue within the wound bed. General Notes: Calloused Periwound Assessment Active Problems ICD-10 Type 2 diabetes mellitus with foot ulcer Non-pressure chronic ulcer of other part of left foot with fat layer exposed Type 2 diabetes mellitus with diabetic polyneuropathy Chronic systolic (congestive) heart failure Paroxysmal atrial fibrillation Acquired absence of right leg below knee Patient's wound is stable. He has been approved for Grafix and we will place this at next clinic visit. For now I did silver nitrate to the hyper granulated areas and recommended doing Medihoney. Procedures Wound #1 Pre-procedure diagnosis of Wound #1 is a Diabetic Wound/Ulcer of the Lower Extremity located on the Left,Plantar Foot . An Chemical Cauterization procedure was performed by Kalman Shan, DO. Post procedure Diagnosis Wound #1: Same as Pre-Procedure Plan Follow-up Appointments: Return Appointment in 1 week. - with Dr. Heber Guion and Leveda Anna, RN (Room 7) Other: - Call Dr. Sherryle Lis at Podiatry to see about diabetic shoe or inserts. Anesthetic: (In clinic) Topical Lidocaine 5% applied to  wound bed (In clinic) Topical Lidocaine 4% applied to wound bed Cellular or Tissue Based Products: Other Cellular or Tissue Based Products Orders/Instructions: - Will order Grafix Bathing/ Shower/ Hygiene: May shower and wash wound with soap and water. Edema Control - Lymphedema / SCD / Other: Elevate legs to the level of the heart or above for 30 minutes daily and/or when sitting, a frequency of: - several times a day Avoid standing for long periods of time. Patient to wear own compression stockings every day. Additional Orders / Instructions: Follow Nutritious Diet - Monitor/Control Blood Sugars WOUND #1: - Foot Wound Laterality: Plantar, Left Cleanser: Soap and Water 1 x Per Day/30 Days Discharge Instructions: May shower and wash wound with dial antibacterial soap and water prior to dressing change. Prim Dressing: MediHoney Gel, tube 1.5 (oz) 1 x Per Day/30 Days ary Discharge Instructions: Use until you get Regranex Secondary Dressing: Bordered Gauze, 2x3.75 in 1 x Per Day/30 Days Discharge Instructions: Apply over primary dressing as directed. Secondary Dressing: Optifoam Non-Adhesive Dressing, 4x4 in 1 x Per Day/30 Days Discharge Instructions: Apply as donut Secondary Dressing: Woven Gauze Sponges 2x2 in 1 x Per Day/30 Days Discharge Instructions: Apply over primary dressing as directed. 1. Silver nitrate 2. Medihoney 3. Aggressive offloadingoofoam doughnut 4. Follow-up in 1 week Electronic Signature(s) Signed: 02/06/2022 2:17:28 PM By: Kalman Shan DO Entered By: Kalman Shan on 02/06/2022 13:42:42 -------------------------------------------------------------------------------- HxROS Details Patient Name: Date of Service: [name], Tyrone [name] 02/06/2022 12:30 PM Medical Record Number: [id number] Patient Account Number: [id number] Date of Birth/Sex: Treating RN: 1940/09/30 (81 y.o. M) Primary Care Provider: Dimas Chyle Other Clinician: Referring Provider: Treating  Provider/Extender: Jessee Avers in Treatment: 6 Information Obtained From Patient Chart Cardiovascular Medical History: Positive for: Arrhythmia; Congestive Heart Failure; Coronary Artery Disease Endocrine Medical History: Positive for: Type II Diabetes Treated with: Oral agents Genitourinary Medical History: Past Medical History Notes: Hx BPH Musculoskeletal Medical History: Positive for: Osteoarthritis Neurologic Medical History: Positive for: Neuropathy Oncologic Medical History: Past Medical History Notes: Basal Cell Carcinoma (Face) Immunizations Pneumococcal Vaccine: Received Pneumococcal Vaccination: Yes Received Pneumococcal Vaccination On or After 60th Birthday: Yes Implantable Devices None Family and Social History Cancer: No; Diabetes: No; Heart Disease: Yes - Siblings; Hereditary Spherocytosis: No; Hypertension: Yes - Siblings; Kidney Disease: No; Lung Disease: No; Seizures: No; Stroke: Yes - Father; Thyroid Problems: No; Tuberculosis: No; Former smoker -  Quit in 1983; Marital Status - Widowed; Alcohol Use: Rarely; Drug Use: No History; Caffeine Use: Rarely; Financial Concerns: No; Food, Clothing or Shelter Needs: No; Support System Lacking: No; Transportation Concerns: No Electronic Signature(s) Signed: 02/06/2022 2:17:28 PM By: Kalman Shan DO Entered By: Kalman Shan on 02/06/2022 13:31:14 -------------------------------------------------------------------------------- SuperBill Details Patient Name: Date of Service: [name], Tyrone [name] 02/06/2022 Medical Record Number: [id number] Patient Account Number: [id number] Date of Birth/Sex: Treating RN: 02-13-1941 (81 y.o. [name] Primary Care Provider: Dimas Chyle Other Clinician: Referring Provider: Treating Provider/Extender: Laurena Slimmer Weeks in Treatment: 6 Diagnosis Coding ICD-10 Codes Code Description [phone] Type 2 diabetes mellitus  with foot ulcer L97.522 Non-pressure chronic ulcer of other part of left foot with fat layer exposed E11.42 Type 2 diabetes mellitus with diabetic polyneuropathy U83.72 Chronic systolic (congestive) heart failure I48.0 Paroxysmal atrial fibrillation Z89.511 Acquired absence of right leg below knee Facility Procedures CPT4 Code: [id number] Description: 20802 - CHEM CAUT GRANULATION TISS ICD-10 Diagnosis Description L97.522 Non-pressure chronic ulcer of other part of left foot with fat layer exposed E11.621 Type 2 diabetes mellitus with foot ulcer Modifier: Quantity: 1 Physician Procedures : CPT4 Code Description Modifier [id number] - WC PHYS CHEM CAUT GRAN TISSUE ICD-10 Diagnosis Description L97.522 Non-pressure chronic ulcer of other part of left foot with fat layer exposed E11.621 Type 2 diabetes mellitus with foot ulcer Quantity: 1 Electronic Signature(s) Signed: 02/06/2022 2:17:28 PM By: Kalman Shan DO Entered By: Kalman Shan on 02/06/2022 13:42:57

## 2022-02-15 ENCOUNTER — Encounter (HOSPITAL_BASED_OUTPATIENT_CLINIC_OR_DEPARTMENT_OTHER): Payer: Medicare Other | Admitting: Internal Medicine

## 2022-02-15 DIAGNOSIS — E11621 Type 2 diabetes mellitus with foot ulcer: Secondary | ICD-10-CM

## 2022-02-15 DIAGNOSIS — L97522 Non-pressure chronic ulcer of other part of left foot with fat layer exposed: Secondary | ICD-10-CM

## 2022-02-15 DIAGNOSIS — I480 Paroxysmal atrial fibrillation: Secondary | ICD-10-CM | POA: Diagnosis not present

## 2022-02-15 DIAGNOSIS — I5022 Chronic systolic (congestive) heart failure: Secondary | ICD-10-CM | POA: Diagnosis not present

## 2022-02-15 DIAGNOSIS — Z89511 Acquired absence of right leg below knee: Secondary | ICD-10-CM | POA: Diagnosis not present

## 2022-02-15 DIAGNOSIS — E1142 Type 2 diabetes mellitus with diabetic polyneuropathy: Secondary | ICD-10-CM | POA: Diagnosis not present

## 2022-02-21 NOTE — Progress Notes (Signed)
[name] ([id number]) Visit Report for 02/15/2022 Arrival Information Details Patient Name: Date of Service: [name] 02/15/2022 2:45 PM Medical Record Number: [id number] Patient Account Number: [id number] Date of Birth/Sex: Treating RN: 07/05/1940 (81 y.o. [name] Primary Care Khoa Opdahl: [name] Other Clinician: Referring [name]: Treating [name]/Extender: [name] in Treatment: 7 Visit Information History Since Last Visit Added or deleted any medications: No Patient Arrived: [name] Any new allergies or adverse reactions: No Arrival Time: 14:24 Had a fall or experienced change in No Accompanied Roberts: SELF activities of daily living that may affect Transfer Assistance: None risk of falls: Patient Requires Transmission-Based Precautions: No Signs or symptoms of abuse/neglect since last visito No Patient Has Alerts: Yes Hospitalized since last visit: No Patient Alerts: Patient on Blood Thinner Implantable device outside of the clinic excluding No ABI: R=Non Comp cellular tissue based products placed in the center since last visit: Has Dressing in Place as Prescribed: Yes Pain Present Now: No Electronic Signature(s) Signed: 02/15/2022 4:57:37 PM Roberts: [name]: [name] on 02/15/2022 14:28:58 -------------------------------------------------------------------------------- Encounter Discharge Information Details Patient Name: Date of Service: [name], Tyrone Tyrone E. 02/15/2022 2:45 PM Medical Record Number: [id number] Patient Account Number: [id number] Date of Birth/Sex: Treating RN: Nov 25, 1940 (81 y.o. [name] Primary Care Ainslie Mazurek: [name] Other Clinician: Referring Yuniel Blaney: Treating Zayin Valadez/Extender: [name] in Treatment: 7 Encounter Discharge Information Items Post Procedure Vitals Discharge Condition: Stable Temperature (F):  97.7 Ambulatory Status: Cane Pulse (bpm): 65 Discharge Destination: Home Respiratory Rate (breaths/min): 18 Transportation: Private Auto Blood Pressure (mmHg): 109/69 Accompanied Roberts: self Schedule Follow-up Appointment: Yes Clinical Summary of Care: Patient Declined Electronic Signature(s) Signed: 02/15/2022 4:57:37 PM Roberts: [name]: [name] on 02/15/2022 15:29:36 -------------------------------------------------------------------------------- Lower Extremity Assessment Details Patient Name: Date of Service: Tyrone Beck Tyrone E. 02/15/2022 2:45 PM Medical Record Number: [id number] Patient Account Number: [id number] Date of Birth/Sex: Treating RN: [birth year] (81 y.o. [name] Primary Care Jakeim Sedore: [name] Other Clinician: Referring Lorin Gawron: Treating Ashantia Amaral/Extender: [name] Weeks in Treatment: 7 Edema Assessment Assessed: [Left: No] [Right: No] Edema: [Left: Ye] [Right: s] Calf Left: Right: Point of Measurement: 31 cm From Medial Instep 32.6 cm Ankle Left: Right: Point of Measurement: 9 cm From Medial Instep 29 cm Electronic Signature(s) Signed: 02/15/2022 4:57:37 PM Roberts: [name]: [name] on 02/15/2022 14:31:31 -------------------------------------------------------------------------------- Multi Wound Chart Details Patient Name: Date of Service: [name], Tyrone Tyrone E. 02/15/2022 2:45 PM Medical Record Number: [id number] Patient Account Number: [id number] Date of Birth/Sex: Treating RN: [birth year] (81 y.o. [name] Primary Care Brenee Gajda: [name] Other Clinician: Referring Shantice Menger: Treating Shabnam Ladd/Extender: [name] in Treatment: 7 Vital Signs Height(in): 72 Capillary Blood Glucose(mg/dl): 108 Weight(lbs): 198 Pulse(bpm): 78 Body Mass Index(BMI): 26.9 Blood Pressure(mmHg): 109/69 Temperature(F): Respiratory  Rate(breaths/min): 18 Photos: [N/A:N/A] Left, Plantar Foot N/A N/A Wound Location: Gradually Appeared N/A N/A Wounding Event: Diabetic Wound/Ulcer of the Lower N/A N/A Primary Etiology: Extremity Arrhythmia, Congestive Heart Failure, N/A N/A Comorbid History: Coronary Artery Disease, Type II Diabetes, Osteoarthritis, Neuropathy 01/24/2021 N/A N/A Date Acquired: 7 N/A N/A Weeks of Treatment: Open N/A N/A Wound Status: No N/A N/A Wound Recurrence: 1.4x1.6x0.2 N/A N/A Measurements L x W x D (cm) 1.759 N/A N/A A (cm) : rea 0.352 N/A N/A Volume (cm) : -9.80% N/A N/A % Reduction in A rea: 26.80% N/A N/A % Reduction  in Volume: Grade 1 N/A N/A Classification: Medium N/A N/A Exudate A mount: Serosanguineous N/A N/A Exudate Type: red, brown N/A N/A Exudate Color: Well defined, not attached N/A N/A Wound Margin: Large (67-100%) N/A N/A Granulation A mount: Red, Hyper-granulation N/A N/A Granulation Quality: None Present (0%) N/A N/A Necrotic A mount: Fat Layer (Subcutaneous Tissue): Yes N/A N/A Exposed Structures: Fascia: No Tendon: No Muscle: No Joint: No Bone: No None N/A N/A Epithelialization: Debridement - Selective/Open Wound N/A N/A Debridement: Pre-procedure Verification/Time Out 14:55 N/A N/A Taken: Lidocaine 4% Topical Solution N/A N/A Pain Control: Callus N/A N/A Tissue Debrided: Skin/Epidermis N/A N/A Level: 2.24 N/A N/A Debridement A (sq cm): rea Curette N/A N/A Instrument: Minimum N/A N/A Bleeding: Pressure N/A N/A Hemostasis A chieved: 0 N/A N/A Procedural Pain: 0 N/A N/A Post Procedural Pain: Procedure was tolerated well N/A N/A Debridement Treatment Response: 1.4x1.6x0.2 N/A N/A Post Debridement Measurements L x W x D (cm) 0.352 N/A N/A Post Debridement Volume: (cm) Cellular or Tissue Based Product N/A N/A Procedures Performed: Chemical Cauterization Debridement Treatment Notes Wound #1 (Foot) Wound Laterality:  Plantar, Left Cleanser Soap and Water Discharge Instruction: May shower and wash wound with dial antibacterial soap and water prior to dressing change. Peri-Wound Care Topical Primary Dressing Secondary Dressing Woven Gauze Sponge, Non-Sterile 4x4 in Discharge Instruction: Apply over primary dressing as directed. Secured With Elastic Bandage 4 inch (ACE bandage) Discharge Instruction: Secure with ACE bandage as directed. Kerlix Roll Sterile, 4.5x3.1 (in/yd) Discharge Instruction: Secure with Kerlix as directed. Compression Wrap Compression Stockings Add-Ons Electronic Signature(s) Signed: 02/16/2022 9:20:47 AM Roberts: Kalman Shan DO Signed: 02/21/2022 4:59:35 PM Roberts: Lorrin Jackson Entered Roberts: Kalman Shan on 02/15/2022 15:37:00 -------------------------------------------------------------------------------- Multi-Disciplinary Care Plan Details Patient Name: Date of Service: [name], Tyrone Tyrone E. 02/15/2022 2:45 PM Medical Record Number: [id number] Patient Account Number: [id number] Date of Birth/Sex: Treating RN: 1940/10/24 (81 y.o. [name] Primary Care Tyjai Matuszak: [name] Other Clinician: Referring Nicodemus Denk: Treating Arland Usery/Extender: [name] in Treatment: 7 Active Inactive Wound/Skin Impairment Nursing Diagnoses: Impaired tissue integrity Goals: Patient/caregiver will verbalize understanding of skin care regimen Date Initiated: 12/26/2021 Target Resolution Date: 03/23/2022 Goal Status: Active Ulcer/skin breakdown will have a volume reduction of 30% Roberts week 4 Date Initiated: 12/26/2021 Date Inactivated: 01/23/2022 Target Resolution Date: 01/23/2022 Goal Status: Met Ulcer/skin breakdown will have a volume reduction of 50% Roberts week 8 Date Initiated: 01/23/2022 Target Resolution Date: 03/23/2022 Goal Status: Active Interventions: Assess patient/caregiver ability to obtain necessary supplies Assess patient/caregiver ability to  perform ulcer/skin care regimen upon admission and as needed Assess ulceration(s) every visit Provide education on ulcer and skin care Treatment Activities: Topical wound management initiated : 12/26/2021 Notes: 01/23/22: Wound care regimen continues. Wound greater than 30% volume reduction, new goal initiated. Electronic Signature(s) Signed: 02/15/2022 4:57:37 PM Roberts: [name]: [name] on 02/15/2022 14:33:45 -------------------------------------------------------------------------------- Pain Assessment Details Patient Name: Date of Service: Tyrone Beck Tyrone E. 02/15/2022 2:45 PM Medical Record Number: [id number] Patient Account Number: [id number] Date of Birth/Sex: Treating RN: July 01, 1940 (81 y.o. [name] Primary Care Karess Harner: [name] Other Clinician: Referring Todrick Siedschlag: Treating Duane Trias/Extender: [name] in Treatment: 7 Active Problems Location of Pain Severity and Description of Pain Patient Has Paino No Site Locations Rate the pain. Rate the pain. Current Pain Level: 0 Pain Management and Medication Current Pain Management: Electronic Signature(s) Signed: 02/15/2022 4:57:37 PM Roberts: [name]: [name] on 02/15/2022 14:31:26 -------------------------------------------------------------------------------- Patient/Caregiver  Education Details Patient Name: Date of Service: [name] 9/21/2023andnbsp2:45 PM Medical Record Number: [id number] Patient Account Number: [id number] Date of Birth/Gender: Treating RN: 18-May-1941 (81 y.o. [name] Primary Care Physician: [name] Other Clinician: Referring Physician: Treating Physician/Extender: [name] in Treatment: 7 Education Assessment Education Provided To: Patient Education Topics Provided Wound/Skin Impairment: Methods: Explain/Verbal Responses:  Reinforcements needed, State content correctly Electronic Signature(s) Signed: 02/15/2022 4:57:37 PM Roberts: [name]: [name] on 02/15/2022 14:34:00 -------------------------------------------------------------------------------- Wound Assessment Details Patient Name: Date of Service: Tyrone Beck Tyrone E. 02/15/2022 2:45 PM Medical Record Number: [id number] Patient Account Number: [id number] Date of Birth/Sex: Treating RN: January 10, 1941 (81 y.o. [name] Primary Care Faustine Tates: [name] Other Clinician: Referring Kamica Florance: Treating Macie Baum/Extender: [name] Weeks in Treatment: 7 Wound Status Wound Number: 1 Primary Diabetic Wound/Ulcer of the Lower Extremity Etiology: Wound Location: Left, Plantar Foot Wound Open Wounding Event: Gradually Appeared Status: Date Acquired: 01/24/2021 Comorbid Arrhythmia, Congestive Heart Failure, Coronary Artery Disease, Weeks Of Treatment: 7 History: Type II Diabetes, Osteoarthritis, Neuropathy Clustered Wound: No Photos Wound Measurements Length: (cm) 1.4 Width: (cm) 1.6 Depth: (cm) 0.2 Area: (cm) 1.759 Volume: (cm) 0.352 % Reduction in Area: -9.8% % Reduction in Volume: 26.8% Epithelialization: None Tunneling: No Undermining: No Wound Description Classification: Grade 1 Wound Margin: Well defined, not attached Exudate Amount: Medium Exudate Type: Serosanguineous Exudate Color: red, brown Foul Odor After Cleansing: No Slough/Fibrino No Wound Bed Granulation Amount: Large (67-100%) Exposed Structure Granulation Quality: Red, Hyper-granulation Fascia Exposed: No Necrotic Amount: None Present (0%) Fat Layer (Subcutaneous Tissue) Exposed: Yes Tendon Exposed: No Muscle Exposed: No Joint Exposed: No Bone Exposed: No Treatment Notes Wound #1 (Foot) Wound Laterality: Plantar, Left Cleanser Soap and Water Discharge Instruction: May shower and wash wound with dial  antibacterial soap and water prior to dressing change. Peri-Wound Care Topical Primary Dressing Secondary Dressing Woven Gauze Sponge, Non-Sterile 4x4 in Discharge Instruction: Apply over primary dressing as directed. Secured With Elastic Bandage 4 inch (ACE bandage) Discharge Instruction: Secure with ACE bandage as directed. Kerlix Roll Sterile, 4.5x3.1 (in/yd) Discharge Instruction: Secure with Kerlix as directed. Compression Wrap Compression Stockings Add-Ons Electronic Signature(s) Signed: 02/15/2022 4:57:37 PM Roberts: [name] Signed: 02/15/2022 5:58:31 PM Roberts: Deon Pilling RN, BSN Entered Roberts: Deon Pilling on 02/15/2022 14:35:09 -------------------------------------------------------------------------------- Vitals Details Patient Name: Date of Service: [name], Tyrone Tyrone E. 02/15/2022 2:45 PM Medical Record Number: [id number] Patient Account Number: [id number] Date of Birth/Sex: Treating RN: March 26, 1941 (81 y.o. [name] Primary Care Tionne Carelli: [name] Other Clinician: Referring Ronald Londo: Treating Dalila Arca/Extender: [name] in Treatment: 7 Vital Signs Time Taken: 14:29 Pulse (bpm): 65 Height (in): 72 Respiratory Rate (breaths/min): 18 Weight (lbs): 198 Blood Pressure (mmHg): 109/69 Body Mass Index (BMI): 26.9 Capillary Blood Glucose (mg/dl): 108 Reference Range: 80 - 120 mg / dl Electronic Signature(s) Signed: 02/15/2022 4:57:37 PM Roberts: [name]: [name] on 02/15/2022 14:31:21

## 2022-02-21 NOTE — Progress Notes (Signed)
ROYALTY, Roberts ([id number]) Visit Report for 02/15/2022 Chief Complaint Document Details Patient Name: Date of Service: [name] 02/15/2022 2:45 PM Medical Record Number: [id number] Patient Account Number: [id number] Date of Birth/Sex: Treating RN: 06-17-1940 (81 y.o. [name] Primary Care Provider: Dimas Chyle Other Clinician: Referring Provider: Treating Provider/Extender: Laurena Slimmer Weeks in Treatment: 7 Information Obtained from: Patient Chief Complaint 12/26/2021; left foot plantar foot wound Electronic Signature(s) Signed: 02/16/2022 9:20:47 AM By: Kalman Shan DO Entered By: Kalman Shan on 02/15/2022 15:37:06 -------------------------------------------------------------------------------- Cellular or Tissue Based Product Details Patient Name: Date of Service: Tyrone Mccallum E. 02/15/2022 2:45 PM Medical Record Number: [id number] Patient Account Number: [id number] Date of Birth/Sex: Treating RN: Jun 26, 1940 (81 y.o. [name] Primary Care Provider: Dimas Chyle Other Clinician: Referring Provider: Treating Provider/Extender: Jessee Avers in Treatment: 7 Cellular or Tissue Based Product Type Wound #1 Left,Plantar Foot Applied to: Performed By: Physician Kalman Shan, DO Cellular or Tissue Based Product Type: Grafix prime Level of Consciousness (Pre-procedure): Awake and Alert Pre-procedure Verification/Time Out Yes - 14:58 Taken: Location: genitalia / hands / feet / multiple digits Wound Size (sq cm): 2.24 Product Size (sq cm): 6 Waste Size (sq cm): 2 Waste Reason: wound size Amount of Product Applied (sq cm): 4 Instrument Used: Forceps, Scissors Lot #: [id number] Expiration Date: 05/06/2023 Fenestrated: No Reconstituted: Yes Solution Type: normal saline Solution Amount: 1 ml Lot #: [id number] Solution Expiration Date: 02/25/2022 Secured: Yes Secured With: Steri-Strips Dressing  Applied: Yes Primary Dressing: Adaptic Procedural Pain: 0 Post Procedural Pain: 0 Response to Treatment: Procedure was tolerated well Level of Consciousness (Post- Awake and Alert procedure): Post Procedure Diagnosis Same as Pre-procedure Electronic Signature(s) Signed: 02/15/2022 4:57:37 PM By: Adline Peals Signed: 02/16/2022 9:20:47 AM By: Kalman Shan DO Entered By: Adline Peals on 02/15/2022 15:03:10 -------------------------------------------------------------------------------- Debridement Details Patient Name: Date of Service: [name], [name] 02/15/2022 2:45 PM Medical Record Number: [id number] Patient Account Number: [id number] Date of Birth/Sex: Treating RN: 10/08/1940 (81 y.o. [name] Primary Care Provider: Dimas Chyle Other Clinician: Referring Provider: Treating Provider/Extender: Jessee Avers in Treatment: 7 Debridement Performed for Assessment: Wound #1 Left,Plantar Foot Performed By: Physician Kalman Shan, DO Debridement Type: Debridement Severity of Tissue Pre Debridement: Fat layer exposed Level of Consciousness (Pre-procedure): Awake and Alert Pre-procedure Verification/Time Out Yes - 14:55 Taken: Start Time: 14:55 Pain Control: Lidocaine 4% T opical Solution T Area Debrided (L x W): otal 1.4 (cm) x 1.6 (cm) = 2.24 (cm) Tissue and other material debrided: Non-Viable, Callus, Skin: Epidermis Level: Skin/Epidermis Debridement Description: Selective/Open Wound Instrument: Curette Bleeding: Minimum Hemostasis Achieved: Pressure Procedural Pain: 0 Post Procedural Pain: 0 Response to Treatment: Procedure was tolerated well Level of Consciousness (Post- Awake and Alert procedure): Post Debridement Measurements of Total Wound Length: (cm) 1.4 Width: (cm) 1.6 Depth: (cm) 0.2 Volume: (cm) 0.352 Character of Wound/Ulcer Post Debridement: Improved Severity of Tissue Post Debridement: Fat layer  exposed Post Procedure Diagnosis Same as Pre-procedure Notes scribed for Dr. Heber [place] by Adline Peals, RN Electronic Signature(s) Signed: 02/15/2022 4:57:37 PM By: Adline Peals Signed: 02/16/2022 9:20:47 AM By: Kalman Shan DO Entered By: Adline Peals on 02/15/2022 14:59:12 -------------------------------------------------------------------------------- HPI Details Patient Name: Date of Service: [name], [name] 02/15/2022 2:45 PM Medical Record Number: [id number] Patient Account Number: [id number] Date of Birth/Sex: Treating RN: 1941-02-21 (81 y.o. [name] Primary Care Provider: Dimas Chyle Other Clinician: Referring Provider:  Treating Provider/Extender: Laurena Slimmer Weeks in Treatment: 7 History of Present Illness HPI Description: 12/26/2021 [name] is an 82 year old male with a past medical history of controlled type 2 diabetes, A-fib on Eliquis, chronic systolic heart failure on Entresto and right below knee amputation (2017) that presents to the clinic for a left plantar foot wound. He states this has been present for 1 year and is not sure how it started. He has peripheral neuropathy. He follows with Dr. Sherryle Lis, podiatry for this issue. He had an MRI on 10/16/2021 that did not show evidence of osteomyelitis. He has tried collagen with some benefit. Currently patient is keeping the area covered with a bandaid and no dressing. Due to his right BKA he has issues with balance and uses a cane. He is not a good candidate for the total contact cast. Surgical exostectomy has been thought of for offloading however risk and recovery is high. Patient currently denies signs of infection. 8/15; patient presents for follow-up. He did not pick up her Granix from the pharmacy. He has been keeping the area covered. We discussed potentially doing a skin substitute and he was interested in this. He denies signs of infection. 8/29; patient  presents for follow-up. He has been using Medihoney to the wound bed. He is not able to offload this area due to having a prosthesis. We have not heard back for insurance approval of a skin substitute. 9/12; patient presents for follow-up. We have been using Medihoney to the wound bed. He has no issues or complaints today. He has been approved for Grafix. We discussed using this at next clinic visit and he was agreeable. 9/21; patient presents for follow-up. He has been using Medihoney to the wound bed. Grafix is available and patient is agreeable to having this placed today. He knows to not get this wet and to return in 1 week. Electronic Signature(s) Signed: 02/16/2022 9:20:47 AM By: Kalman Shan DO Entered By: Kalman Shan on 02/15/2022 15:37:38 -------------------------------------------------------------------------------- Chemical Cauterization Details Patient Name: Date of Service: [name], [name] 02/15/2022 2:45 PM Medical Record Number: [id number] Patient Account Number: [id number] Date of Birth/Sex: Treating RN: [birth year] (81 y.o. [name] Primary Care Provider: Dimas Chyle Other Clinician: Referring Provider: Treating Provider/Extender: Jessee Avers in Treatment: 7 Procedure Performed for: Wound #1 Left,Plantar Foot Performed By: Physician Kalman Shan, DO Post Procedure Diagnosis Same as Pre-procedure Notes scribed for Dr. Heber [place] by Adline Peals, RN Electronic Signature(s) Signed: 02/15/2022 4:57:37 PM By: Adline Peals Signed: 02/16/2022 9:20:47 AM By: Kalman Shan DO Entered By: Adline Peals on 02/15/2022 14:59:36 -------------------------------------------------------------------------------- Physical Exam Details Patient Name: Date of Service: [name], [name] 02/15/2022 2:45 PM Medical Record Number: [id number] Patient Account Number: [id number] Date of Birth/Sex: Treating RN: 1940-07-10 (81  y.o. [name] Primary Care Provider: Dimas Chyle Other Clinician: Referring Provider: Treating Provider/Extender: Laurena Slimmer Weeks in Treatment: 7 Constitutional respirations regular, non-labored and within target range for patient.. Cardiovascular 2+ dorsalis pedis/posterior tibialis pulses. Psychiatric pleasant and cooperative. Notes Left foot: T the plantar medial aspect there is an open wound with hyper granulated tissue. No signs of surrounding infection. o Electronic Signature(s) Signed: 02/16/2022 9:20:47 AM By: Kalman Shan DO Entered By: Kalman Shan on 02/15/2022 15:38:06 -------------------------------------------------------------------------------- Physician Orders Details Patient Name: Date of Service: [name], [name] 02/15/2022 2:45 PM Medical Record Number: [id number] Patient Account Number: [id number] Date of Birth/Sex: Treating RN: [birth year] (81  y.o. [name] Primary Care Provider: Dimas Chyle Other Clinician: Referring Provider: Treating Provider/Extender: Jessee Avers in Treatment: 7 Verbal / Phone Orders: No Diagnosis Coding Follow-up Appointments ppointment in 1 week. - with Dr. Heber [place] and Leveda Anna, RN (Room 7) Return A Other: - Call Dr. Sherryle Lis at Podiatry to see about diabetic shoe or inserts. Anesthetic (In clinic) Topical Lidocaine 4% applied to wound bed Cellular or Tissue Based Products Cellular or Tissue Based Product Type: - Grafix PL Prime 2x3 #1 daptic or Mepitel. (DO NOT REMOVE). - DO Cellular or Tissue Based Product applied to wound bed, secured with steri-strips, cover with A NOT REMOVE OR GET WET Bathing/ Shower/ Hygiene Do not shower or bathe in tub. - FOOT CANNOT GET WET Edema Control - Lymphedema / SCD / Other Elevate legs to the level of the heart or above for 30 minutes daily and/or when sitting, a frequency of: - several times a day Avoid standing for  long periods of time. Patient to wear own compression stockings every day. Additional Orders / Instructions Follow Nutritious Diet - Monitor/Control Blood Sugars Wound Treatment Wound #1 - Foot Wound Laterality: Plantar, Left Cleanser: Soap and Water 1 x Per Day/30 Days Discharge Instructions: May shower and wash wound with dial antibacterial soap and water prior to dressing change. Secondary Dressing: Woven Gauze Sponge, Non-Sterile 4x4 in 1 x Per Day/30 Days Discharge Instructions: Apply over primary dressing as directed. Secured With: Elastic Bandage 4 inch (ACE bandage) 1 x Per Day/30 Days Discharge Instructions: Secure with ACE bandage as directed. Secured With: [employer], 4.5x3.1 (in/yd) 1 x Per Day/30 Days Discharge Instructions: Secure with Kerlix as directed. Patient Medications llergies: lisinopril A Notifications Medication Indication Start End 02/15/2022 lidocaine DOSE topical 4 % cream - cream topical Electronic Signature(s) Signed: 02/16/2022 9:20:47 AM By: Kalman Shan DO Entered By: Kalman Shan on 02/15/2022 15:38:12 -------------------------------------------------------------------------------- Problem List Details Patient Name: Date of Service: [name], [name] 02/15/2022 2:45 PM Medical Record Number: [id number] Patient Account Number: [id number] Date of Birth/Sex: Treating RN: [birth year] (81 y.o. [name] Primary Care Provider: Dimas Chyle Other Clinician: Referring Provider: Treating Provider/Extender: Laurena Slimmer Weeks in Treatment: 7 Active Problems ICD-10 Encounter Code Description Active Date MDM Diagnosis E11.621 Type 2 diabetes mellitus with foot ulcer 12/26/2021 No Yes L97.522 Non-pressure chronic ulcer of other part of left foot with fat layer exposed 12/26/2021 No Yes E11.42 Type 2 diabetes mellitus with diabetic polyneuropathy 12/26/2021 No Yes B70.48 Chronic systolic (congestive) heart failure  12/26/2021 No Yes I48.0 Paroxysmal atrial fibrillation 12/26/2021 No Yes Z89.511 Acquired absence of right leg below knee 12/26/2021 No Yes Inactive Problems Resolved Problems Electronic Signature(s) Signed: 02/16/2022 9:20:47 AM By: Kalman Shan DO Entered By: Kalman Shan on 02/15/2022 15:36:55 -------------------------------------------------------------------------------- Progress Note Details Patient Name: Date of Service: [name], [name] 02/15/2022 2:45 PM Medical Record Number: [id number] Patient Account Number: [id number] Date of Birth/Sex: Treating RN: 1940/10/07 (81 y.o. [name] Primary Care Provider: Dimas Chyle Other Clinician: Referring Provider: Treating Provider/Extender: Jessee Avers in Treatment: 7 Subjective Chief Complaint Information obtained from Patient 12/26/2021; left foot plantar foot wound History of Present Illness (HPI) 12/26/2021 Mr. Koi Zangara is an 81 year old male with a past medical history of controlled type 2 diabetes, A-fib on Eliquis, chronic systolic heart failure on Entresto and right below knee amputation (2017) that presents to the clinic for a left plantar foot wound. He states this  has been present for 1 year and is not sure how it started. He has peripheral neuropathy. He follows with Dr. Sherryle Lis, podiatry for this issue. He had an MRI on 10/16/2021 that did not show evidence of osteomyelitis. He has tried collagen with some benefit. Currently patient is keeping the area covered with a bandaid and no dressing. Due to his right BKA he has issues with balance and uses a cane. He is not a good candidate for the total contact cast. Surgical exostectomy has been thought of for offloading however risk and recovery is high. Patient currently denies signs of infection. 8/15; patient presents for follow-up. He did not pick up her Granix from the pharmacy. He has been keeping the area covered. We discussed potentially  doing a skin substitute and he was interested in this. He denies signs of infection. 8/29; patient presents for follow-up. He has been using Medihoney to the wound bed. He is not able to offload this area due to having a prosthesis. We have not heard back for insurance approval of a skin substitute. 9/12; patient presents for follow-up. We have been using Medihoney to the wound bed. He has no issues or complaints today. He has been approved for Grafix. We discussed using this at next clinic visit and he was agreeable. 9/21; patient presents for follow-up. He has been using Medihoney to the wound bed. Grafix is available and patient is agreeable to having this placed today. He knows to not get this wet and to return in 1 week. Patient History Information obtained from Patient, Chart. Family History Heart Disease - Siblings, Hypertension - Siblings, Stroke - Father, No family history of Cancer, Diabetes, Hereditary Spherocytosis, Kidney Disease, Lung Disease, Seizures, Thyroid Problems, Tuberculosis. Social History Former smoker - Quit in 1983, Marital Status - Widowed, Alcohol Use - Rarely, Drug Use - No History, Caffeine Use - Rarely. Medical History Cardiovascular Patient has history of Arrhythmia, Congestive Heart Failure, Coronary Artery Disease Endocrine Patient has history of Type II Diabetes Musculoskeletal Patient has history of Osteoarthritis Neurologic Patient has history of Neuropathy Medical A Surgical History Notes nd Genitourinary Hx BPH Oncologic Basal Cell Carcinoma (Face) Objective Constitutional respirations regular, non-labored and within target range for patient.. Vitals Time Taken: 2:29 PM, Height: 72 in, Weight: 198 lbs, BMI: 26.9, Pulse: 65 bpm, Respiratory Rate: 18 breaths/min, Blood Pressure: 109/69 mmHg, Capillary Blood Glucose: 108 mg/dl. Cardiovascular 2+ dorsalis pedis/posterior tibialis pulses. Psychiatric pleasant and cooperative. General Notes:  Left foot: T the plantar medial aspect there is an open wound with hyper granulated tissue. No signs of surrounding infection. o Integumentary (Hair, Skin) Wound #1 status is Open. Original cause of wound was Gradually Appeared. The date acquired was: 01/24/2021. The wound has been in treatment 7 weeks. The wound is located on the [place]. The wound measures 1.4cm length x 1.6cm width x 0.2cm depth; 1.759cm^2 area and 0.352cm^3 volume. There is Fat Layer (Subcutaneous Tissue) exposed. There is no tunneling or undermining noted. There is a medium amount of serosanguineous drainage noted. The wound margin is well defined and not attached to the wound base. There is large (67-100%) red, hyper - granulation within the wound bed. There is no necrotic tissue within the wound bed. Assessment Active Problems ICD-10 Type 2 diabetes mellitus with foot ulcer Non-pressure chronic ulcer of other part of left foot with fat layer exposed Type 2 diabetes mellitus with diabetic polyneuropathy Chronic systolic (congestive) heart failure Paroxysmal atrial fibrillation Acquired absence of right leg below knee  Patient's wound is stable. I used silver nitrate to the hyper granulated areas. Grafix #1 was placed in standard fashion today. I recommended aggressive offloading. Procedures Wound #1 Pre-procedure diagnosis of Wound #1 is a Diabetic Wound/Ulcer of the Lower Extremity located on the Left,Plantar Foot .Severity of Tissue Pre Debridement is: Fat layer exposed. There was a Selective/Open Wound Skin/Epidermis Debridement with a total area of 2.24 sq cm performed by Kalman Shan, DO. With the following instrument(s): Curette to remove Non-Viable tissue/material. Material removed includes Callus and Skin: Epidermis and after achieving pain control using Lidocaine 4% Topical Solution. No specimens were taken. A time out was conducted at 14:55, prior to the start of the procedure. A Minimum amount of  bleeding was controlled with Pressure. The procedure was tolerated well with a pain level of 0 throughout and a pain level of 0 following the procedure. Post Debridement Measurements: 1.4cm length x 1.6cm width x 0.2cm depth; 0.352cm^3 volume. Character of Wound/Ulcer Post Debridement is improved. Severity of Tissue Post Debridement is: Fat layer exposed. Post procedure Diagnosis Wound #1: Same as Pre-Procedure General Notes: scribed for Dr. Heber [place] by Adline Peals, RN. Pre-procedure diagnosis of Wound #1 is a Diabetic Wound/Ulcer of the Lower Extremity located on the [place]. A skin graft procedure using a bioengineered skin substitute/cellular or tissue based product was performed by Kalman Shan, DO with the following instrument(s): Forceps and Scissors. Grafix prime was applied and secured with Steri-Strips. 4 sq cm of product was utilized and 2 sq cm was wasted due to wound size. Post Application, Adaptic was applied. A Time Out was conducted at 14:58, prior to the start of the procedure. The procedure was tolerated well with a pain level of 0 throughout and a pain level of 0 following the procedure. Post procedure Diagnosis Wound #1: Same as Pre-Procedure . Pre-procedure diagnosis of Wound #1 is a Diabetic Wound/Ulcer of the Lower Extremity located on the [place] . An Chemical Cauterization procedure was performed by Kalman Shan, DO. Post procedure Diagnosis Wound #1: Same as Pre-Procedure Notes: scribed for Dr. Heber  by Adline Peals, RN Plan Follow-up Appointments: Return Appointment in 1 week. - with Dr. Heber  and Leveda Anna, RN (Room 7) Other: - Call Dr. Sherryle Lis at Podiatry to see about diabetic shoe or inserts. Anesthetic: (In clinic) Topical Lidocaine 4% applied to wound bed Cellular or Tissue Based Products: Cellular or Tissue Based Product Type: - Grafix PL Prime 2x3 #1 Cellular or Tissue Based Product applied to wound bed, secured with  steri-strips, cover with Adaptic or Mepitel. (DO NOT REMOVE). - DO NOT REMOVE OR GET WET Bathing/ Shower/ Hygiene: Do not shower or bathe in tub. - FOOT CANNOT GET WET Edema Control - Lymphedema / SCD / Other: Elevate legs to the level of the heart or above for 30 minutes daily and/or when sitting, a frequency of: - several times a day Avoid standing for long periods of time. Patient to wear own compression stockings every day. Additional Orders / Instructions: Follow Nutritious Diet - Monitor/Control Blood Sugars The following medication(s) was prescribed: lidocaine topical 4 % cream cream topical was prescribed at facility WOUND #1: - Foot Wound Laterality: Plantar, Left Cleanser: Soap and Water 1 x Per Day/30 Days Discharge Instructions: May shower and wash wound with dial antibacterial soap and water prior to dressing change. Secondary Dressing: Woven Gauze Sponge, Non-Sterile 4x4 in 1 x Per Day/30 Days Discharge Instructions: Apply over primary dressing as directed. Secured With: Elastic Bandage 4 inch (ACE bandage)  1 x Per Day/30 Days Discharge Instructions: Secure with ACE bandage as directed. Secured With: [employer], 4.5x3.1 (in/yd) 1 x Per Day/30 Days Discharge Instructions: Secure with Kerlix as directed. 1. Silver nitrate 2. Grafix #1 placed in standard fashion 3. Follow-up in 1 week 4. Offload Electronic Signature(s) Signed: 02/16/2022 9:20:47 AM By: Kalman Shan DO Entered By: Kalman Shan on 02/15/2022 15:39:13 -------------------------------------------------------------------------------- HxROS Details Patient Name: Date of Service: [name], [name] 02/15/2022 2:45 PM Medical Record Number: [id number] Patient Account Number: [id number] Date of Birth/Sex: Treating RN: 1940-12-28 (81 y.o. [name] Primary Care Provider: Dimas Chyle Other Clinician: Referring Provider: Treating Provider/Extender: Jessee Avers in  Treatment: 7 Information Obtained From Patient Chart Cardiovascular Medical History: Positive for: Arrhythmia; Congestive Heart Failure; Coronary Artery Disease Endocrine Medical History: Positive for: Type II Diabetes Treated with: Oral agents Genitourinary Medical History: Past Medical History Notes: Hx BPH Musculoskeletal Medical History: Positive for: Osteoarthritis Neurologic Medical History: Positive for: Neuropathy Oncologic Medical History: Past Medical History Notes: Basal Cell Carcinoma (Face) Immunizations Pneumococcal Vaccine: Received Pneumococcal Vaccination: Yes Received Pneumococcal Vaccination On or After 60th Birthday: Yes Implantable Devices None Family and Social History Cancer: No; Diabetes: No; Heart Disease: Yes - Siblings; Hereditary Spherocytosis: No; Hypertension: Yes - Siblings; Kidney Disease: No; Lung Disease: No; Seizures: No; Stroke: Yes - Father; Thyroid Problems: No; Tuberculosis: No; Former smoker - Quit in 1983; Marital Status - Widowed; Alcohol Use: Rarely; Drug Use: No History; Caffeine Use: Rarely; Financial Concerns: No; Food, Clothing or Shelter Needs: No; Support System Lacking: No; Transportation Concerns: No Electronic Signature(s) Signed: 02/16/2022 9:20:47 AM By: Kalman Shan DO Signed: 02/21/2022 4:59:35 PM By: Lorrin Jackson Entered By: Kalman Shan on 02/15/2022 15:37:44 -------------------------------------------------------------------------------- [place] Details Patient Name: Date of Service: [name], [name] 02/15/2022 Medical Record Number: [id number] Patient Account Number: [id number] Date of Birth/Sex: Treating RN: June 18, 1940 (81 y.o. [name] Primary Care Provider: Dimas Chyle Other Clinician: Referring Provider: Treating Provider/Extender: Laurena Slimmer Weeks in Treatment: 7 Diagnosis Coding ICD-10 Codes Code Description E11.621 Type 2 diabetes mellitus with foot  ulcer L97.522 Non-pressure chronic ulcer of other part of left foot with fat layer exposed E11.42 Type 2 diabetes mellitus with diabetic polyneuropathy M35.59 Chronic systolic (congestive) heart failure I48.0 Paroxysmal atrial fibrillation Z89.511 Acquired absence of right leg below knee Facility Procedures CPT4 Code: [id number] Description: (Facility Use Only) Grafix Prime 2x3 Modifier: Quantity: 6 CPT4 Code: [id number] Description: 12248 - SKIN SUB GRAFT FACE/NK/HF/G ICD-10 Diagnosis Description L97.522 Non-pressure chronic ulcer of other part of left foot with fat layer exposed E11.621 Type 2 diabetes mellitus with foot ulcer Modifier: Quantity: 1 Physician Procedures : CPT4 Code Description Modifier [id number] - WC PHYS SKIN SUB GRAFT FACE/NK/HF/G ICD-10 Diagnosis Description L97.522 Non-pressure chronic ulcer of other part of left foot with fat layer exposed E11.621 Type 2 diabetes mellitus with foot ulcer Quantity: 1 Electronic Signature(s) Signed: 02/16/2022 9:20:47 AM By: Kalman Shan DO Entered By: Kalman Shan on 02/15/2022 15:39:23

## 2022-02-22 ENCOUNTER — Encounter (HOSPITAL_BASED_OUTPATIENT_CLINIC_OR_DEPARTMENT_OTHER): Payer: Medicare Other | Admitting: Internal Medicine

## 2022-02-22 DIAGNOSIS — I480 Paroxysmal atrial fibrillation: Secondary | ICD-10-CM | POA: Diagnosis not present

## 2022-02-22 DIAGNOSIS — Z89511 Acquired absence of right leg below knee: Secondary | ICD-10-CM | POA: Diagnosis not present

## 2022-02-22 DIAGNOSIS — L97522 Non-pressure chronic ulcer of other part of left foot with fat layer exposed: Secondary | ICD-10-CM | POA: Diagnosis not present

## 2022-02-22 DIAGNOSIS — E1142 Type 2 diabetes mellitus with diabetic polyneuropathy: Secondary | ICD-10-CM | POA: Diagnosis not present

## 2022-02-22 DIAGNOSIS — E11621 Type 2 diabetes mellitus with foot ulcer: Secondary | ICD-10-CM | POA: Diagnosis not present

## 2022-02-22 DIAGNOSIS — I5022 Chronic systolic (congestive) heart failure: Secondary | ICD-10-CM | POA: Diagnosis not present

## 2022-02-22 NOTE — Progress Notes (Signed)
DAREAN, ROTE ([id number]) Visit Report for 02/22/2022 Arrival Information Details Patient Name: Date of Service: [name] 02/22/2022 2:30 PM Medical Record Number: [id number] Patient Account Number: [id number] Date of Birth/Sex: Treating RN: [birth year] (81 y.o. M) Primary Care Chaniece Barbato: Dimas Chyle Other Clinician: Referring Johnrobert Foti: Treating Maytal Mijangos/Extender: Donalee Citrin in Treatment: 8 Visit Information History Since Last Visit Added or deleted any medications: No Patient Arrived: [name] Any new allergies or adverse reactions: No Arrival Time: 14:23 Had a fall or experienced change in No Accompanied By: self activities of daily living that may affect Transfer Assistance: None risk of falls: Patient Identification Verified: Yes Signs or symptoms of abuse/neglect since last visito No Secondary Verification Process Completed: Yes Hospitalized since last visit: No Patient Requires Transmission-Based Precautions: No Implantable device outside of the clinic excluding No Patient Has Alerts: Yes cellular tissue based products placed in the center Patient Alerts: Patient on Blood Thinner since last visit: ABI: R=Non Comp Has Dressing in Place as Prescribed: Yes Pain Present Now: No Electronic Signature(s) Signed: 02/22/2022 4:22:00 PM By: Erenest Blank Entered By: Erenest Blank on 02/22/2022 14:26:01 -------------------------------------------------------------------------------- Encounter Discharge Information Details Patient Name: Date of Service: [name], Tyrone [name] 02/22/2022 2:30 PM Medical Record Number: [id number] Patient Account Number: [id number] Date of Birth/Sex: Treating RN: 1940/08/11 (81 y.o. [name] Primary Care Ravyn Nikkel: Dimas Chyle Other Clinician: Referring Mirranda Monrroy: Treating Halbert Jesson/Extender: Donalee Citrin in Treatment: 8 Encounter Discharge Information Items Post Procedure  Vitals Discharge Condition: Stable Temperature (F): 97.7 Ambulatory Status: Cane Pulse (bpm): 71 Discharge Destination: Home Respiratory Rate (breaths/min): 18 Transportation: Private Auto Blood Pressure (mmHg): 103/69 Schedule Follow-up Appointment: Yes Clinical Summary of Care: Provided on 02/22/2022 Form Type Recipient Paper Patient Patient Electronic Signature(s) Signed: 02/22/2022 5:28:59 PM By: Lorrin Jackson Entered By: Lorrin Jackson on 02/22/2022 15:15:42 -------------------------------------------------------------------------------- Lower Extremity Assessment Details Patient Name: Date of Service: Tyrone Beck [name] 02/22/2022 2:30 PM Medical Record Number: [id number] Patient Account Number: [id number] Date of Birth/Sex: Treating RN: [birth year] (81 y.o. M) Primary Care Chayanne Filippi: Dimas Chyle Other Clinician: Referring Darshan Solanki: Treating Mesa Janus/Extender: Donalee Citrin in Treatment: 8 Edema Assessment Assessed: [Left: No] [Right: No] Edema: [Left: Ye] [Right: s] Calf Left: Right: Point of Measurement: 31 cm From Medial Instep 34.5 cm Ankle Left: Right: Point of Measurement: 9 cm From Medial Instep 25.4 cm Electronic Signature(s) Signed: 02/22/2022 4:22:00 PM By: Erenest Blank Entered By: Erenest Blank on 02/22/2022 14:35:12 -------------------------------------------------------------------------------- Multi Wound Chart Details Patient Name: Date of Service: [name], Tyrone [name] 02/22/2022 2:30 PM Medical Record Number: [id number] Patient Account Number: [id number] Date of Birth/Sex: Treating RN: [birth year] (81 y.o. M) Primary Care Inman Fettig: Dimas Chyle Other Clinician: Referring Helen Cuff: Treating Marcus Groll/Extender: Donalee Citrin in Treatment: 8 Vital Signs Height(in): 72 Capillary Blood Glucose(mg/dl): 106 Weight(lbs): 198 Pulse(bpm): 50 Body Mass Index(BMI): 26.9 Blood Pressure(mmHg):  103/69 Temperature(F): 97.7 Respiratory Rate(breaths/min): 18 Photos: [N/A:N/A] Left, Plantar Foot N/A N/A Wound Location: Gradually Appeared N/A N/A Wounding Event: Diabetic Wound/Ulcer of the Lower N/A N/A Primary Etiology: Extremity Arrhythmia, Congestive Heart Failure, N/A N/A Comorbid History: Coronary Artery Disease, Type II Diabetes, Osteoarthritis, Neuropathy 01/24/2021 N/A N/A Date Acquired: 8 N/A N/A Weeks of Treatment: Open N/A N/A Wound Status: No N/A N/A Wound Recurrence: 1.3x1.9x0.2 N/A N/A Measurements L x W x D (cm) 1.94 N/A N/A A (cm) : rea 0.388 N/A N/A Volume (cm) : -21.10% N/A N/A % Reduction  in A rea: 19.30% N/A N/A % Reduction in Volume: Grade 1 N/A N/A Classification: Medium N/A N/A Exudate A mount: Serosanguineous N/A N/A Exudate Type: red, brown N/A N/A Exudate Color: Well defined, not attached N/A N/A Wound Margin: Large (67-100%) N/A N/A Granulation A mount: Red, Hyper-granulation N/A N/A Granulation Quality: None Present (0%) N/A N/A Necrotic A mount: Fat Layer (Subcutaneous Tissue): Yes N/A N/A Exposed Structures: Fascia: No Tendon: No Muscle: No Joint: No Bone: No None N/A N/A Epithelialization: Debridement - Excisional N/A N/A Debridement: Pre-procedure Verification/Time Out 14:49 N/A N/A Taken: Lidocaine 4% Topical Solution N/A N/A Pain Control: Subcutaneous, Slough N/A N/A Tissue Debrided: Skin/Subcutaneous Tissue N/A N/A Level: 2.47 N/A N/A Debridement A (sq cm): rea Curette N/A N/A Instrument: Minimum N/A N/A Bleeding: Pressure N/A N/A Hemostasis A chieved: Procedure was tolerated well N/A N/A Debridement Treatment Response: 1.3x1.9x0.2 N/A N/A Post Debridement Measurements L x W x D (cm) 0.388 N/A N/A Post Debridement Volume: (cm) Cellular or Tissue Based Product N/A N/A Procedures Performed: Debridement Treatment Notes Electronic Signature(s) Signed: 02/22/2022 4:39:24 PM By: Linton Ham MD Entered By: Linton Ham on 02/22/2022 15:14:50 -------------------------------------------------------------------------------- Multi-Disciplinary Care Plan Details Patient Name: Date of Service: [name], Tyrone [name] 02/22/2022 2:30 PM Medical Record Number: [id number] Patient Account Number: [id number] Date of Birth/Sex: Treating RN: Jun 15, 1940 (81 y.o. [name] Primary Care Makynleigh Breslin: Dimas Chyle Other Clinician: Referring Ambera Fedele: Treating Jersey Espinoza/Extender: Donalee Citrin in Treatment: 8 Active Inactive Wound/Skin Impairment Nursing Diagnoses: Impaired tissue integrity Goals: Patient/caregiver will verbalize understanding of skin care regimen Date Initiated: 12/26/2021 Target Resolution Date: 03/23/2022 Goal Status: Active Ulcer/skin breakdown will have a volume reduction of 30% by week 4 Date Initiated: 12/26/2021 Date Inactivated: 01/23/2022 Target Resolution Date: 01/23/2022 Goal Status: Met Ulcer/skin breakdown will have a volume reduction of 50% by week 8 Date Initiated: 01/23/2022 Target Resolution Date: 03/23/2022 Goal Status: Active Interventions: Assess patient/caregiver ability to obtain necessary supplies Assess patient/caregiver ability to perform ulcer/skin care regimen upon admission and as needed Assess ulceration(s) every visit Provide education on ulcer and skin care Treatment Activities: Topical wound management initiated : 12/26/2021 Notes: 01/23/22: Wound care regimen continues. Wound greater than 30% volume reduction, new goal initiated. Electronic Signature(s) Signed: 02/22/2022 2:45:48 PM By: Lorrin Jackson Entered By: Lorrin Jackson on 02/22/2022 14:45:47 -------------------------------------------------------------------------------- Pain Assessment Details Patient Name: Date of Service: [name], Tyrone [name] 02/22/2022 2:30 PM Medical Record Number: [id number] Patient Account Number: [id number] Date of  Birth/Sex: Treating RN: 26-Feb-1941 (81 y.o. M) Primary Care Loura Pitt: Dimas Chyle Other Clinician: Referring Latrisa Hellums: Treating Cornellius Kropp/Extender: Donalee Citrin in Treatment: 8 Active Problems Location of Pain Severity and Description of Pain Patient Has Paino No Site Locations Pain Management and Medication Current Pain Management: Electronic Signature(s) Signed: 02/22/2022 4:22:00 PM By: Erenest Blank Entered By: Erenest Blank on 02/22/2022 14:26:46 -------------------------------------------------------------------------------- Patient/Caregiver Education Details Patient Name: Date of Service: [name] 9/28/2023andnbsp2:30 PM Medical Record Number: [id number] Patient Account Number: [id number] Date of Birth/Gender: Treating RN: 04/18/1941 (81 y.o. [name] Primary Care Physician: Dimas Chyle Other Clinician: Referring Physician: Treating Physician/Extender: Donalee Citrin in Treatment: 8 Education Assessment Education Provided To: Patient Education Topics Provided Venous: Methods: Explain/Verbal, Printed Responses: State content correctly Wound/Skin Impairment: Methods: Explain/Verbal, Printed Responses: State content correctly Electronic Signature(s) Signed: 02/22/2022 5:28:59 PM By: Lorrin Jackson Entered By: Lorrin Jackson on 02/22/2022 14:46:10 -------------------------------------------------------------------------------- Wound Assessment Details Patient Name: Date of Service: [name], Tyrone [name] 02/22/2022 2:30 PM Medical Record Number: [id number] Patient Account Number: [id number] Date of Birth/Sex: Treating RN: [birth year] (81 y.o. M) Primary Care Yamaira Spinner: Dimas Chyle Other Clinician: Referring Talbert Trembath: Treating Chasady Longwell/Extender: Donalee Citrin in Treatment: 8 Wound Status Wound Number: 1 Primary Diabetic Wound/Ulcer of the Lower Extremity Etiology: Wound  Location: Left, Plantar Foot Wound Open Wounding Event: Gradually Appeared Status: Date Acquired: 01/24/2021 Comorbid Arrhythmia, Congestive Heart Failure, Coronary Artery Disease, Weeks Of Treatment: 8 History: Type II Diabetes, Osteoarthritis, Neuropathy Clustered Wound: No Photos Wound Measurements Length: (cm) 1.3 Width: (cm) 1.9 Depth: (cm) 0.2 Area: (cm) 1.94 Volume: (cm) 0.388 % Reduction in Area: -21.1% % Reduction in Volume: 19.3% Epithelialization: None Tunneling: No Undermining: No Wound Description Classification: Grade 1 Wound Margin: Well defined, not attached Exudate Amount: Medium Exudate Type: Serosanguineous Exudate Color: red, brown Foul Odor After Cleansing: No Slough/Fibrino No Wound Bed Granulation Amount: Large (67-100%) Exposed Structure Granulation Quality: Red, Hyper-granulation Fascia Exposed: No Necrotic Amount: None Present (0%) Fat Layer (Subcutaneous Tissue) Exposed: Yes Tendon Exposed: No Muscle Exposed: No Joint Exposed: No Bone Exposed: No Treatment Notes Wound #1 (Foot) Wound Laterality: Plantar, Left Cleanser Soap and Water Discharge Instruction: May shower and wash wound with dial antibacterial soap and water prior to dressing change. Peri-Wound Care Topical Primary Dressing Secondary Dressing ADAPTIC TOUCH 3x4.25 in Discharge Instruction: Apply over primary dressing as directed. Woven Gauze Sponge, Non-Sterile 4x4 in Discharge Instruction: Apply over primary dressing as directed. Secured With Elastic Bandage 4 inch (ACE bandage) Discharge Instruction: Secure with ACE bandage as directed. Kerlix Roll Sterile, 4.5x3.1 (in/yd) Discharge Instruction: Secure with Kerlix as directed. Compression Wrap Compression Stockings Add-Ons Electronic Signature(s) Signed: 02/22/2022 4:22:00 PM By: Erenest Blank Entered By: Erenest Blank on 02/22/2022  14:38:12 -------------------------------------------------------------------------------- Vitals Details Patient Name: Date of Service: [name], Tyrone [name] 02/22/2022 2:30 PM Medical Record Number: [id number] Patient Account Number: [id number] Date of Birth/Sex: Treating RN: 05/01/1941 (81 y.o. M) Primary Care Parnika Tweten: Dimas Chyle Other Clinician: Referring Miangel Flom: Treating Oluwafemi Villella/Extender: Donalee Citrin in Treatment: 8 Vital Signs Time Taken: 14:26 Temperature (F): 97.7 Height (in): 72 Pulse (bpm): 71 Weight (lbs): 198 Respiratory Rate (breaths/min): 18 Body Mass Index (BMI): 26.9 Blood Pressure (mmHg): 103/69 Capillary Blood Glucose (mg/dl): 106 Reference Range: 80 - 120 mg / dl Electronic Signature(s) Signed: 02/22/2022 4:22:00 PM By: Erenest Blank Entered By: Erenest Blank on 02/22/2022 14:26:36

## 2022-02-22 NOTE — Progress Notes (Signed)
[facility], BANAS ([id number]) Visit Report for 02/22/2022 Cellular or Tissue Based Product Details Patient Name: Date of Service: [name] 02/22/2022 2:30 PM Medical Record Number: [id number] Patient Account Number: [id number] Date of Birth/Sex: Treating RN: [birth year] (81 y.o. M) Primary Care Provider: Dimas Chyle Other Clinician: Referring Provider: Treating Provider/Extender: Donalee Citrin in Treatment: 8 Cellular or Tissue Based Product Type Wound #1 Left,Plantar Foot Applied to: Performed By: Physician Ricard Dillon., MD Cellular or Tissue Based Product Type: Grafix prime Level of Consciousness (Pre-procedure): Awake and Alert Pre-procedure Verification/Time Out Yes - 14:50 Taken: Location: genitalia / hands / feet / multiple digits Wound Size (sq cm): 2.47 Product Size (sq cm): 6 Waste Size (sq cm): 0 Amount of Product Applied (sq cm): 6 Instrument Used: Forceps, Scissors Lot #: [id number] Expiration Date: 06/17/2023 Reconstituted: Yes Solution Type: Normal Saline Solution Amount: 77m Lot #: [id number] Expiration Date: 09/24/2024 Secured: Yes Secured With: Steri-Strips Dressing Applied: Yes Primary Dressing: Adaptic, Gauze Response to Treatment: Procedure was tolerated well Level of Consciousness (Post- Awake and Alert procedure): Post Procedure Diagnosis Same as Pre-procedure Notes Procedure scribed for Dr. RDellia Nimsby JJanan Halter RN Electronic Signature(s) Signed: 02/22/2022 4:39:24 PM By: RLinton HamMD Entered By: RLinton Hamon 02/22/2022 15:15:26 -------------------------------------------------------------------------------- Debridement Details Patient Name: Date of Service: [name] Tyrone [name] 02/22/2022 2:30 PM Medical Record Number: [id number] Account Number: [id number] of Birth/Sex: Treating RN: [birth year](81y.o. M) Primary Care Provider: PDimas ChyleOther Clinician: Referring  Provider: Treating Provider/Extender: RDonalee Citrinin Treatment: 8 Debridement Performed for Assessment: Wound #1 Left,Plantar Foot Performed By: Physician RRicard Dillon, MD Debridement Type: Debridement Severity of Tissue Pre Debridement: Fat layer exposed Level of Consciousness (Pre-procedure): Awake and Alert Pre-procedure Verification/Time Out Yes - 14:49 Taken: Start Time: 14:50 Pain Control: Lidocaine 4% T opical Solution T Area Debrided (L x W): otal 1.3 (cm) x 1.9 (cm) = 2.47 (cm) Tissue and other material debrided: Non-Viable, Slough, Subcutaneous, Slough Level: Skin/Subcutaneous Tissue Debridement Description: Excisional Instrument: Curette Bleeding: Minimum Hemostasis Achieved: Pressure End Time: 14:55 Response to Treatment: Procedure was tolerated well Level of Consciousness (Post- Awake and Alert procedure): Post Debridement Measurements of Total Wound Length: (cm) 1.3 Width: (cm) 1.9 Depth: (cm) 0.2 Volume: (cm) 0.388 Character of Wound/Ulcer Post Debridement: Stable Severity of Tissue Post Debridement: Fat layer exposed Post Procedure Diagnosis Same as Pre-procedure Notes Procedure scribed for Dr. RDellia Nimsby JJanan Halter RN Electronic Signature(s) Signed: 02/22/2022 4:39:24 PM By: RLinton HamMD Entered By: RLinton Hamon 02/22/2022 15:15:16 -------------------------------------------------------------------------------- HPI Details Patient Name: Date of Service: [name] Tyrone [name] 02/22/2022 2:30 PM Medical Record Number: [id number] Account Number: [id number] of Birth/Sex: Treating RN: 506-Sep-1942(81y.o. M) Primary Care Provider: PDimas ChyleOther Clinician: Referring Provider: Treating Provider/Extender: RDonalee Citrinin Treatment: 8 History of Present Illness HPI Description: 12/26/2021 Mr. DJeral Roberts an 81year old male with a past medical history of controlled type 2  diabetes, A-fib on Eliquis, chronic systolic heart failure on Entresto and right below knee amputation (2017) that presents to the clinic for a left plantar foot wound. He states this has been present for 1 year and is not sure how it started. He has peripheral neuropathy. He follows with Dr. MSherryle Lis podiatry for this issue. He had an MRI on 10/16/2021 that did not show evidence of osteomyelitis. He has tried collagen with some benefit. Currently  patient is keeping the area covered with a bandaid and no dressing. Due to his right BKA he has issues with balance and uses a cane. He is not a good candidate for the total contact cast. Surgical exostectomy has been thought of for offloading however risk and recovery is high. Patient currently denies signs of infection. 8/15; patient presents for follow-up. He did not pick up her Granix from the pharmacy. He has been keeping the area covered. We discussed potentially doing a skin substitute and he was interested in this. He denies signs of infection. 8/29; patient presents for follow-up. He has been using Medihoney to the wound bed. He is not able to offload this area due to having a prosthesis. We have not heard back for insurance approval of a skin substitute. 9/12; patient presents for follow-up. We have been using Medihoney to the wound bed. He has no issues or complaints today. He has been approved for Grafix. We discussed using this at next clinic visit and he was agreeable. 9/21; patient presents for follow-up. He has been using Medihoney to the wound bed. Grafix is available and patient is agreeable to having this placed today. He knows to not get this wet and to return in 1 week. 9/28; this is a patient who has a wound on his left plantar foot for about 2-1/2 to 3 months. Previous BKA on the right which complicates our ability to offload this. We have been using Grafix and we reapplied this today postdebridement Electronic Signature(s) Signed:  02/22/2022 4:39:24 PM By: Linton Ham MD Entered By: Linton Ham on 02/22/2022 15:16:27 -------------------------------------------------------------------------------- Physical Exam Details Patient Name: Date of Service: [name], Tyrone [name] 02/22/2022 2:30 PM Medical Record Number: [id number] Patient Account Number: [id number] Date of Birth/Sex: Treating RN: 12-Nov-1940 (81 y.o. M) Primary Care Provider: Dimas Chyle Other Clinician: Referring Provider: Treating Provider/Extender: Donalee Citrin in Treatment: 8 Constitutional Sitting or standing Blood Pressure is within target range for patient.. Pulse regular and within target range for patient.Marland Kitchen Respirations regular, non-labored and within target range.. Temperature is normal and within the target range for the patient.Marland Kitchen Appears in no distress. Notes Wound exam; plantar medial aspect of his foot. Very adherent slough on the surface removed with a #3 curette hemostasis with silver nitrate and direct pressure there is no signs of surrounding infection Electronic Signature(s) Signed: 02/22/2022 4:39:24 PM By: Linton Ham MD Entered By: Linton Ham on 02/22/2022 15:19:09 -------------------------------------------------------------------------------- Physician Orders Details Patient Name: Date of Service: [name], Tyrone [name] 02/22/2022 2:30 PM Medical Record Number: [id number] Patient Account Number: [id number] Date of Birth/Sex: Treating RN: 03-25-1941 (81 y.o. [name] Primary Care Provider: Dimas Chyle Other Clinician: Referring Provider: Treating Provider/Extender: Donalee Citrin in Treatment: 8 Verbal / Phone Orders: No Diagnosis Coding ICD-10 Coding Code Description E11.621 Type 2 diabetes mellitus with foot ulcer L97.522 Non-pressure chronic ulcer of other part of left foot with fat layer exposed E11.42 Type 2 diabetes mellitus with diabetic  polyneuropathy X09.40 Chronic systolic (congestive) heart failure I48.0 Paroxysmal atrial fibrillation Z89.511 Acquired absence of right leg below knee Follow-up Appointments ppointment in 1 week. - with Dr. Heber [place] and Leveda Anna, RN (Room 7) Return A Other: - Call Dr. Sherryle Lis at Podiatry to see about diabetic shoe or inserts. Anesthetic (In clinic) Topical Lidocaine 5% applied to wound bed (In clinic) Topical Lidocaine 4% applied to wound bed Cellular or Tissue Based Products Cellular or Tissue Based Product Type: -  Grafix PL Prime 2x3 #1, #2 02/22/22 daptic or Mepitel. (DO NOT REMOVE). - DO Cellular or Tissue Based Product applied to wound bed, secured with steri-strips, cover with A NOT REMOVE OR GET WET Bathing/ Shower/ Hygiene Do not shower or bathe in tub. - FOOT CANNOT GET WET Edema Control - Lymphedema / SCD / Other Elevate legs to the level of the heart or above for 30 minutes daily and/or when sitting, a frequency of: - several times a day Avoid standing for long periods of time. Patient to wear own compression stockings every day. Additional Orders / Instructions Follow Nutritious Diet - Monitor/Control Blood Sugars Wound Treatment Wound #1 - Foot Wound Laterality: Plantar, Left Cleanser: Soap and Water 1 x Per Week/30 Days Discharge Instructions: May shower and wash wound with dial antibacterial soap and water prior to dressing change. Secondary Dressing: ADAPTIC TOUCH 3x4.25 in 1 x Per Week/30 Days Discharge Instructions: Apply over primary dressing as directed. Secondary Dressing: Woven Gauze Sponge, Non-Sterile 4x4 in 1 x Per Week/30 Days Discharge Instructions: Apply over primary dressing as directed. Secured With: Elastic Bandage 4 inch (ACE bandage) 1 x Per Week/30 Days Discharge Instructions: Secure with ACE bandage as directed. Secured With: [employer], 4.5x3.1 (in/yd) 1 x Per Week/30 Days Discharge Instructions: Secure with Kerlix as directed. Electronic  Signature(s) Signed: 02/22/2022 4:39:24 PM By: Linton Ham MD Signed: 02/22/2022 5:28:59 PM By: Lorrin Jackson Entered By: Lorrin Jackson on 02/22/2022 15:03:09 -------------------------------------------------------------------------------- Problem List Details Patient Name: Date of Service: [name], Tyrone [name] 02/22/2022 2:30 PM Medical Record Number: [id number] Patient Account Number: [id number] Date of Birth/Sex: Treating RN: [birth year] (81 y.o. [name] Primary Care Provider: Dimas Chyle Other Clinician: Referring Provider: Treating Provider/Extender: Donalee Citrin in Treatment: 8 Active Problems ICD-10 Encounter Code Description Active Date MDM Diagnosis E11.621 Type 2 diabetes mellitus with foot ulcer 12/26/2021 No Yes L97.522 Non-pressure chronic ulcer of other part of left foot with fat layer exposed 12/26/2021 No Yes E11.42 Type 2 diabetes mellitus with diabetic polyneuropathy 12/26/2021 No Yes H21.22 Chronic systolic (congestive) heart failure 12/26/2021 No Yes I48.0 Paroxysmal atrial fibrillation 12/26/2021 No Yes Z89.511 Acquired absence of right leg below knee 12/26/2021 No Yes Inactive Problems Resolved Problems Electronic Signature(s) Signed: 02/22/2022 4:39:24 PM By: Linton Ham MD Previous Signature: 02/22/2022 2:45:33 PM Version By: Lorrin Jackson Entered By: Linton Ham on 02/22/2022 15:14:40 -------------------------------------------------------------------------------- Progress Note Details Patient Name: Date of Service: [name], Tyrone [name] 02/22/2022 2:30 PM Medical Record Number: [id number] Patient Account Number: [id number] Date of Birth/Sex: Treating RN: June 27, 1940 (81 y.o. M) Primary Care Provider: Dimas Chyle Other Clinician: Referring Provider: Treating Provider/Extender: Donalee Citrin in Treatment: 8 Subjective History of Present Illness (HPI) 12/26/2021 [name] is an 81 year old male  with a past medical history of controlled type 2 diabetes, A-fib on Eliquis, chronic systolic heart failure on Entresto and right below knee amputation (2017) that presents to the clinic for a left plantar foot wound. He states this has been present for 1 year and is not sure how it started. He has peripheral neuropathy. He follows with Dr. Sherryle Lis, podiatry for this issue. He had an MRI on 10/16/2021 that did not show evidence of osteomyelitis. He has tried collagen with some benefit. Currently patient is keeping the area covered with a bandaid and no dressing. Due to his right BKA he has issues with balance and uses a cane. He is not a good candidate for  the total contact cast. Surgical exostectomy has been thought of for offloading however risk and recovery is high. Patient currently denies signs of infection. 8/15; patient presents for follow-up. He did not pick up her Granix from the pharmacy. He has been keeping the area covered. We discussed potentially doing a skin substitute and he was interested in this. He denies signs of infection. 8/29; patient presents for follow-up. He has been using Medihoney to the wound bed. He is not able to offload this area due to having a prosthesis. We have not heard back for insurance approval of a skin substitute. 9/12; patient presents for follow-up. We have been using Medihoney to the wound bed. He has no issues or complaints today. He has been approved for Grafix. We discussed using this at next clinic visit and he was agreeable. 9/21; patient presents for follow-up. He has been using Medihoney to the wound bed. Grafix is available and patient is agreeable to having this placed today. He knows to not get this wet and to return in 1 week. 9/28; this is a patient who has a wound on his left plantar foot for about 2-1/2 to 3 months. Previous BKA on the right which complicates our ability to offload this. We have been using Grafix and we reapplied this today  postdebridement Objective Constitutional Sitting or standing Blood Pressure is within target range for patient.. Pulse regular and within target range for patient.Marland Kitchen Respirations regular, non-labored and within target range.. Temperature is normal and within the target range for the patient.Marland Kitchen Appears in no distress. Vitals Time Taken: 2:26 PM, Height: 72 in, Weight: 198 lbs, BMI: 26.9, Temperature: 97.7 F, Pulse: 71 bpm, Respiratory Rate: 18 breaths/min, Blood Pressure: 103/69 mmHg, Capillary Blood Glucose: 106 mg/dl. General Notes: Wound exam; plantar medial aspect of his foot. Very adherent slough on the surface removed with a #3 curette hemostasis with silver nitrate and direct pressure there is no signs of surrounding infection Integumentary (Hair, Skin) Wound #1 status is Open. Original cause of wound was Gradually Appeared. The date acquired was: 01/24/2021. The wound has been in treatment 8 weeks. The wound is located on the [place]. The wound measures 1.3cm length x 1.9cm width x 0.2cm depth; 1.94cm^2 area and 0.388cm^3 volume. There is Fat Layer (Subcutaneous Tissue) exposed. There is no tunneling or undermining noted. There is a medium amount of serosanguineous drainage noted. The wound margin is well defined and not attached to the wound base. There is large (67-100%) red, hyper - granulation within the wound bed. There is no necrotic tissue within the wound bed. Assessment Active Problems ICD-10 Type 2 diabetes mellitus with foot ulcer Non-pressure chronic ulcer of other part of left foot with fat layer exposed Type 2 diabetes mellitus with diabetic polyneuropathy Chronic systolic (congestive) heart failure Paroxysmal atrial fibrillation Acquired absence of right leg below knee Procedures Wound #1 Pre-procedure diagnosis of Wound #1 is a Diabetic Wound/Ulcer of the Lower Extremity located on the Left,Plantar Foot .Severity of Tissue Pre Debridement is: Fat layer  exposed. There was a Excisional Skin/Subcutaneous Tissue Debridement with a total area of 2.47 sq cm performed by Ricard Dillon., MD. With the following instrument(s): Curette to remove Non-Viable tissue/material. Material removed includes Subcutaneous Tissue and Slough and after achieving pain control using Lidocaine 4% T opical Solution. No specimens were taken. A time out was conducted at 14:49, prior to the start of the procedure. A Minimum amount of bleeding was controlled with Pressure. The procedure was tolerated  well. Post Debridement Measurements: 1.3cm length x 1.9cm width x 0.2cm depth; 0.388cm^3 volume. Character of Wound/Ulcer Post Debridement is stable. Severity of Tissue Post Debridement is: Fat layer exposed. Post procedure Diagnosis Wound #1: Same as Pre-Procedure General Notes: Procedure scribed for Dr. Dellia Nims by Janan Halter, RN. Pre-procedure diagnosis of Wound #1 is a Diabetic Wound/Ulcer of the Lower Extremity located on the [place]. A skin graft procedure using a bioengineered skin substitute/cellular or tissue based product was performed by Ricard Dillon., MD with the following instrument(s): Forceps and Scissors. Grafix prime was applied and secured with Steri-Strips. 6 sq cm of product was utilized and 0 sq cm was wasted. Post Application, Adaptic, Gauze was applied. A Time Out was conducted at 14:50, prior to the start of the procedure. The procedure was tolerated well. Post procedure Diagnosis Wound #1: Same as Pre-Procedure General Notes: Procedure scribed for Dr. Dellia Nims by Janan Halter, RN. Plan Follow-up Appointments: Return Appointment in 1 week. - with Dr. Heber [place] and Leveda Anna, RN (Room 7) Other: - Call Dr. Sherryle Lis at Podiatry to see about diabetic shoe or inserts. Anesthetic: (In clinic) Topical Lidocaine 5% applied to wound bed (In clinic) Topical Lidocaine 4% applied to wound bed Cellular or Tissue Based Products: Cellular or Tissue Based Product  Type: - Grafix PL Prime 2x3 #1, #2 02/22/22 Cellular or Tissue Based Product applied to wound bed, secured with steri-strips, cover with Adaptic or Mepitel. (DO NOT REMOVE). - DO NOT REMOVE OR GET WET Bathing/ Shower/ Hygiene: Do not shower or bathe in tub. - FOOT CANNOT GET WET Edema Control - Lymphedema / SCD / Other: Elevate legs to the level of the heart or above for 30 minutes daily and/or when sitting, a frequency of: - several times a day Avoid standing for long periods of time. Patient to wear own compression stockings every day. Additional Orders / Instructions: Follow Nutritious Diet - Monitor/Control Blood Sugars WOUND #1: - Foot Wound Laterality: Plantar, Left Cleanser: Soap and Water 1 x Per Week/30 Days Discharge Instructions: May shower and wash wound with dial antibacterial soap and water prior to dressing change. Secondary Dressing: ADAPTIC TOUCH 3x4.25 in 1 x Per Week/30 Days Discharge Instructions: Apply over primary dressing as directed. Secondary Dressing: Woven Gauze Sponge, Non-Sterile 4x4 in 1 x Per Week/30 Days Discharge Instructions: Apply over primary dressing as directed. Secured With: Elastic Bandage 4 inch (ACE bandage) 1 x Per Week/30 Days Discharge Instructions: Secure with ACE bandage as directed. Secured With: [employer], 4.5x3.1 (in/yd) 1 x Per Week/30 Days Discharge Instructions: Secure with Kerlix as directed. 1. Reapplied Grafix postdebridement 2. The options for offloading this are not numerous. He is supposed to get an insert from podiatry but I do not think he actually went forward with this. 3. I watched him leave the clinic walking with his cane. I am doubtful he would tolerate a total contact cast. I asked him to walk on the foot as little as possible Electronic Signature(s) Signed: 02/22/2022 4:39:24 PM By: Linton Ham MD Entered By: Linton Ham on 02/22/2022  15:20:39 -------------------------------------------------------------------------------- SuperBill Details Patient Name: Date of Service: [name], Tyrone [name] 02/22/2022 Medical Record Number: [id number] Patient Account Number: [id number] Date of Birth/Sex: Treating RN: 30-Jan-1941 (81 y.o. [name] Primary Care Provider: Dimas Chyle Other Clinician: Referring Provider: Treating Provider/Extender: Donalee Citrin in Treatment: 8 Diagnosis Coding ICD-10 Codes Code Description [phone] Type 2 diabetes mellitus with foot ulcer L97.522 Non-pressure chronic ulcer of  other part of left foot with fat layer exposed E11.42 Type 2 diabetes mellitus with diabetic polyneuropathy Y51.83 Chronic systolic (congestive) heart failure I48.0 Paroxysmal atrial fibrillation Z89.511 Acquired absence of right leg below knee Facility Procedures CPT4 Code: [id number] Description: (Facility Use Only) Grafix Prime 2x3 ICD-10 Diagnosis Description L97.522 Non-pressure chronic ulcer of other part of left foot with fat layer exposed Modifier: Quantity: 6 CPT4 Code: [id number] Description: 81188 - SKIN SUB GRAFT FACE/NK/HF/G ICD-10 Diagnosis Description L97.522 Non-pressure chronic ulcer of other part of left foot with fat layer exposed Modifier: Quantity: 1 Physician Procedures : CPT4 Code Description Modifier [phone] [id number] - WC PHYS SKIN SUB GRAFT FACE/NK/HF/G ICD-10 Diagnosis Description L97.522 Non-pressure chronic ulcer of other part of left foot with fat layer exposed Quantity: 1 Electronic Signature(s) Signed: 02/22/2022 4:39:24 PM By: Linton Ham MD Entered By: Linton Ham on 02/22/2022 15:20:52

## 2022-02-26 ENCOUNTER — Encounter: Payer: Self-pay | Admitting: Physician Assistant

## 2022-02-26 ENCOUNTER — Ambulatory Visit (INDEPENDENT_AMBULATORY_CARE_PROVIDER_SITE_OTHER): Payer: Medicare Other | Admitting: Physician Assistant

## 2022-02-26 VITALS — BP 110/70 | HR 69 | Temp 97.3°F | Ht 72.0 in | Wt 206.4 lb

## 2022-02-26 DIAGNOSIS — T7840XA Allergy, unspecified, initial encounter: Secondary | ICD-10-CM

## 2022-02-26 MED ORDER — PREDNISONE 10 MG PO TABS: ORAL_TABLET | ORAL | 0 refills | Status: DC

## 2022-02-26 NOTE — Patient Instructions (Addendum)
It was great to see you!  I think you have an allergic reaction  Start oral prednisone x 2 weeks  Daily claritin and pepcid (generic versions of these are fine!) x 2 weeks  Vaseline/Aquaphor or low dose hydrocortisone cream (over the counter) topically is okay  Follow-up with dermatology or Dr Jerline Pain if no better or any worse  Take care,  Inda Coke PA-C

## 2022-02-26 NOTE — Progress Notes (Signed)
[name] is a 81 y.o. male here for discussion of a new problem.  History of Present Illness:   Chief Complaint  Patient presents with   Rash    Pt c/o rash on face for over a week., slight itching    Rash    Rash Went to the dermatologist about a month ago and was seen by Dr. Ronnald Ramp at [facility].  Was prescribed fluorouracil but did not use it right away because he did not think he needed it  Has been using a 50-50 mixture of nystatin and halobetasol to his face as needed, has been doing this for years Decided to start using the fluorouracil 1 week ago and after 1 night had significant erythema and itchiness to bilateral cheeks on face He immediately discontinued use of the fluorouracil He has continued to apply the nystatin and halobetasol concoction without relief of symptoms Area is significantly itchy  Denies: Fever, chills, discharge from lesions  Past Medical History:  Diagnosis Date   CAD (coronary artery disease)    a. Remote cath 2008 for chest pain showed 30% LAD with luminal irregularity and fairly heavy calcification in the mid LAD without critical stenosis, 30% OM1, 30% acute marginal.   Cancer (HCC)    skin cancer - basal   Cataract    both   Chronic combined systolic and diastolic CHF (congestive heart failure) (HCC)    Complication of anesthesia    Diabetes mellitus    Type II   ED (erectile dysfunction)    History of kidney stones    x1   Hypertension    NICM (nonischemic cardiomyopathy) ([place])    a. EF 40-45% by echo in 08/2017 - prior low risk stress test in 2017.   Osteoarthritis    Paroxysmal SVT (supraventricular tachycardia)    PONV (postoperative nausea and vomiting) 1960   with Ether   PVC's (premature ventricular contractions)    PVD (peripheral vascular disease) (HCC)    s/p R BKA   Rheumatic fever    Rhinitis    S/P BKA (below knee amputation), right (HCC)      Social History   Tobacco Use   Smoking  status: Former    Years: 20.00    Types: Cigarettes    Quit date: 07/26/1981    Years since quitting: 40.6   Smokeless tobacco: Never  Vaping Use   Vaping Use: Never used  Substance Use Topics   Alcohol use: Not Currently    Comment: occasional glass of wine   Drug use: No    Past Surgical History:  Procedure Laterality Date   AMPUTATION Right 02/29/2016   Procedure: Right Leg AMPUTATION BELOW KNEE with Wound Vac placement;  Surgeon: [name], MD;  Location: [place];  Service: Orthopedics;  Laterality: Right;   CARDIOVERSION N/A 06/08/2021   Procedure: CARDIOVERSION;  Surgeon: Jerline Pain, MD;  Location: [facility];  Service: Cardiovascular;  Laterality: N/A;   CHOLECYSTECTOMY  1983   colonoscopy  2006, 2010   EYE SURGERY Bilateral    cataract   INGUINAL HERNIA REPAIR Right 1960   LEFT HEART CATH AND CORONARY ANGIOGRAPHY N/A 06/03/2018   Procedure: LEFT HEART CATH AND CORONARY ANGIOGRAPHY;  Surgeon: Belva Crome, MD;  Location: [place] [facility];  Service: Cardiovascular;  Laterality: N/A;   STUMP REVISION Right 10/30/2017   Procedure: RIGHT BELOW KNEE AMPUTATION REVISION;  Surgeon: [name], MD;  Location: [place];  Service: Orthopedics;  Laterality: Right;    Family History  Problem Relation Age of Onset   Stroke Father 64       Died age 28   Coronary artery disease Brother 47   Colon cancer Neg Hx    Colon polyps Neg Hx    Diabetes Neg Hx    Esophageal cancer Neg Hx    Kidney disease Neg Hx    Gallbladder disease Neg Hx     Allergies  Allergen Reactions   Lisinopril Cough    Current Medications:   Current Outpatient Medications:    Accu-Chek Softclix Lancets lancets, Use to check blood sugar three times a day., Disp: 300 each, Rfl: 4   acetaminophen (TYLENOL) 500 MG tablet, Take 500-1,000 mg by mouth every 6 (six) hours as needed (pain)., Disp: , Rfl:    apixaban (ELIQUIS) 5 MG TABS tablet, Take 1 tablet (5 mg total) by mouth 2 (two) times daily., Disp:  180 tablet, Rfl: 1   carvedilol (COREG) 25 MG tablet, Take 1 tablet (25 mg total) by mouth 2 (two) times daily., Disp: 180 tablet, Rfl: 3   furosemide (LASIX) 20 MG tablet, Take 2 tablets (40 mg total) by mouth daily., Disp: 180 tablet, Rfl: 1   gabapentin (NEURONTIN) 100 MG capsule, TAKE 1 CAPSULE BY MOUTH THREE TIMES A DAY Strength: 100 mg (Patient taking differently: 2 (two) times daily. TAKE 1 CAPSULE BY MOUTH THREE TIMES A DAY Strength: 100 mg), Disp: 270 capsule, Rfl: 3   glucose blood (ACCU-CHEK AVIVA PLUS) test strip, Use 3 times daily. Morning, noon, and nightly to check glucose levels Dx: E11.9 (type 2 diabetes mellitus), Disp: 100 strip, Rfl: 11   halobetasol (ULTRAVATE) 0.05 % cream, APPLY TO AFFECTED AREA TWICE A DAY, Disp: 60 g, Rfl: 2   metFORMIN (GLUCOPHAGE) 1000 MG tablet, TAKE 1 TABLET BY MOUTH EVERY DAY WITH BREAKFAST, Disp: 90 tablet, Rfl: 1   nystatin cream (MYCOSTATIN), APPLY TO AFFECTED AREA TWICE A DAY (Patient taking differently: Apply 1 application  topically 2 (two) times daily as needed (skin irritation).), Disp: 30 g, Rfl: 2   pantoprazole (PROTONIX) 40 MG tablet, Take 1 tablet (40 mg total) by mouth daily., Disp: 90 tablet, Rfl: 1   potassium chloride SA (KLOR-CON M20) 20 MEQ tablet, TAKE 2 TABLETS BY MOUTH EVERY MORNING, Disp: 180 tablet, Rfl: 3   sacubitril-valsartan (ENTRESTO) 49-51 MG, Take 1 tablet by mouth 2 (two) times daily., Disp: 180 tablet, Rfl: 1   sildenafil (VIAGRA) 100 MG tablet, TAKE 1 TABLET DAILY AS NEEDED ED, Disp: 4 tablet, Rfl: 22   tamsulosin (FLOMAX) 0.4 MG CAPS capsule, TAKE 1 CAPSULE BY MOUTH EVERY DAY (Patient taking differently: Take 0.4 mg by mouth daily after breakfast.), Disp: 90 capsule, Rfl: 3   solifenacin (VESICARE) 10 MG tablet, Take 1 tablet (10 mg total) by mouth daily. (Patient not taking: Reported on 02/26/2022), Disp: 30 tablet, Rfl: 2   Review of Systems:   Review of Systems  Skin:  Positive for rash.   Negative unless  otherwise specified per HPI.  Vitals:   Vitals:   02/26/22 1550  BP: 110/70  Pulse: 69  Temp: (!) 97.3 F (36.3 C)  TempSrc: Temporal  SpO2: 96%  Weight: 206 lb 6.1 oz (93.6 kg)  Height: 6' (1.829 m)     Body mass index is 27.99 kg/m.  Physical Exam:   Physical Exam Vitals and nursing note reviewed.  Constitutional:      Appearance: He is well-developed.  HENT:  Head: Normocephalic.  Eyes:     Conjunctiva/sclera: Conjunctivae normal.     Pupils: Pupils are equal, round, and reactive to light.  Pulmonary:     Effort: Pulmonary effort is normal.  Musculoskeletal:        General: Normal range of motion.     Cervical back: Normal range of motion.  Skin:    General: Skin is warm and dry.     Comments: Significant erythema and excoriated skin on bilateral cheeks and temples with scabbing and crusting  Neurological:     Mental Status: He is alert and oriented to person, place, and time.  Psychiatric:        Behavior: Behavior normal.        Thought Content: Thought content normal.        Judgment: Judgment normal.           Assessment and Plan:   Allergic reaction, initial encounter Suspect allergic reaction to fluorouracil He was instructed to stop fluorouracil as well as nystatin and halobetasol concoction Will trial oral prednisone 20 mg daily x1 week followed by 10 milligrams daily x1 week May apply topical Aquaphor and/or over-the-counter hydrocortisone cream Follow-up with PCP or dermatology if any new or worsening symptoms    Inda Coke, PA-C

## 2022-02-27 ENCOUNTER — Other Ambulatory Visit: Payer: Self-pay | Admitting: Internal Medicine

## 2022-02-27 DIAGNOSIS — N401 Enlarged prostate with lower urinary tract symptoms: Secondary | ICD-10-CM

## 2022-03-01 ENCOUNTER — Encounter (HOSPITAL_BASED_OUTPATIENT_CLINIC_OR_DEPARTMENT_OTHER): Payer: Medicare Other | Attending: Internal Medicine | Admitting: Internal Medicine

## 2022-03-01 DIAGNOSIS — I480 Paroxysmal atrial fibrillation: Secondary | ICD-10-CM | POA: Diagnosis not present

## 2022-03-01 DIAGNOSIS — Z89511 Acquired absence of right leg below knee: Secondary | ICD-10-CM

## 2022-03-01 DIAGNOSIS — L97522 Non-pressure chronic ulcer of other part of left foot with fat layer exposed: Secondary | ICD-10-CM

## 2022-03-01 DIAGNOSIS — I5022 Chronic systolic (congestive) heart failure: Secondary | ICD-10-CM | POA: Diagnosis not present

## 2022-03-01 DIAGNOSIS — Z87891 Personal history of nicotine dependence: Secondary | ICD-10-CM | POA: Diagnosis not present

## 2022-03-01 DIAGNOSIS — E11621 Type 2 diabetes mellitus with foot ulcer: Secondary | ICD-10-CM | POA: Diagnosis not present

## 2022-03-01 DIAGNOSIS — E1142 Type 2 diabetes mellitus with diabetic polyneuropathy: Secondary | ICD-10-CM | POA: Diagnosis not present

## 2022-03-01 DIAGNOSIS — Z7901 Long term (current) use of anticoagulants: Secondary | ICD-10-CM | POA: Insufficient documentation

## 2022-03-01 NOTE — Progress Notes (Signed)
[name] ([id number]) Visit Report for 03/01/2022 Arrival Information Details Patient Name: Date of Service: [name] 03/01/2022 11:00 A M Medical Record Number: [id number] Patient Account Number: [id number] Date of Birth/Sex: Treating RN: [birth year] (81 y.o. M) Primary Care Mathilde Mcwherter: Dimas Chyle Other Clinician: Referring Charleen Madera: Treating Garvey Westcott/Extender: Jessee Avers in Treatment: 9 Visit Information History Since Last Visit Added or deleted any medications: No Patient Arrived: Cane Any new allergies or adverse reactions: No Arrival Time: 11:00 Had a fall or experienced change in No Accompanied By: self activities of daily living that may affect Transfer Assistance: None risk of falls: Patient Identification Verified: Yes Signs or symptoms of abuse/neglect since last visito No Secondary Verification Process Completed: Yes Hospitalized since last visit: No Patient Requires Transmission-Based Precautions: No Implantable device outside of the clinic excluding No Patient Has Alerts: Yes cellular tissue based products placed in the center Patient Alerts: Patient on Blood Thinner since last visit: ABI: R=Non Comp Has Dressing in Place as Prescribed: Yes Pain Present Now: No Electronic Signature(s) Signed: 03/01/2022 3:37:19 PM By: Erenest Blank Entered By: Erenest Blank on 03/01/2022 11:01:06 -------------------------------------------------------------------------------- Clinic Level of Care Assessment Details Patient Name: Date of Service: Tyrone Mccallum E. 03/01/2022 11:00 A M Medical Record Number: [id number] Patient Account Number: [id number] Date of Birth/Sex: Treating RN: 01-Aug-1940 (81 y.o. [name] Primary Care Aracelys Glade: Dimas Chyle Other Clinician: Referring Trishelle Devora: Treating Brix Brearley/Extender: Jessee Avers in Treatment: 9 Clinic Level of Care Assessment Items TOOL 4 Quantity Score X-  1 0 Use when only an EandM is performed on FOLLOW-UP visit ASSESSMENTS - Nursing Assessment / Reassessment X- 1 10 Reassessment of Co-morbidities (includes updates in patient status) X- 1 5 Reassessment of Adherence to Treatment Plan ASSESSMENTS - Wound and Skin A ssessment / Reassessment X - Simple Wound Assessment / Reassessment - one wound 1 5 _0  - 0 Complex Wound Assessment / Reassessment - multiple wounds X- 1 10 Dermatologic / Skin Assessment (not related to wound area) ASSESSMENTS - Focused Assessment X- 1 5 Circumferential Edema Measurements - multi extremities X- 1 10 Nutritional Assessment / Counseling / Intervention _1  - 0 Lower Extremity Assessment (monofilament, tuning fork, pulses) _2  - 0 Peripheral Arterial Disease Assessment (using hand held doppler) ASSESSMENTS - Ostomy and/or Continence Assessment and Care _3  - 0 Incontinence Assessment and Management _4  - 0 Ostomy Care Assessment and Management (repouching, etc.) PROCESS - Coordination of Care X - Simple Patient / Family Education for ongoing care 1 15 _5  - 0 Complex (extensive) Patient / Family Education for ongoing care X- 1 10 Staff obtains Programmer, systems, Records, T Results / Process Orders est _6  - 0 Staff telephones HHA, Nursing Homes / Clarify orders / etc _7  - 0 Routine Transfer to another Facility (non-emergent condition) _8  - 0 Routine Hospital Admission (non-emergent condition) _9  - 0 New Admissions / Biomedical engineer / Ordering NPWT Apligraf, etc. , _10  - 0 Emergency Hospital Admission (emergent condition) X- 1 10 Simple Discharge Coordination _11  - 0 Complex (extensive) Discharge Coordination PROCESS - Special Needs _12  - 0 Pediatric / Minor Patient Management _13  - 0 Isolation Patient Management _14  - 0 Hearing / Language / Visual special needs _15  - 0 Assessment of Community assistance (transportation, D/C planning, etc.) _16  - 0 Additional assistance / Altered mentation _17  -  0 Support Surface(s) Assessment (bed, cushion, seat, etc.) INTERVENTIONS - Wound Cleansing / Measurement X - Simple Wound Cleansing - one wound  1 5 _0  - 0 Complex Wound Cleansing - multiple wounds X- 1 5 Wound Imaging (photographs - any number of wounds) _1  - 0 Wound Tracing (instead of photographs) X- 1 5 Simple Wound Measurement - one wound _2  - 0 Complex Wound Measurement - multiple wounds INTERVENTIONS - Wound Dressings X - Small Wound Dressing one or multiple wounds 1 10 _3  - 0 Medium Wound Dressing one or multiple wounds _4  - 0 Large Wound Dressing one or multiple wounds <GEXBMWUXLKGMWNUU>_7<\/OZDGUYQIHKVQQVZD>_6  - 0 Application of Medications - topical <LOVFIEPPIRJJOACZ>_6<\/SAYTKZSWFUXNATFT>_7  - 0 Application of Medications - injection INTERVENTIONS - Miscellaneous _7  - 0 External ear exam _8  - 0 Specimen Collection (cultures, biopsies, blood, body fluids, etc.) _9  - 0 Specimen(s) / Culture(s) sent or taken to Lab for analysis _10  - 0 Patient Transfer (multiple staff / Civil Service fast streamer / Similar devices) _11  - 0 Simple Staple / Suture removal (25 or less) _12  - 0 Complex Staple / Suture removal (26 or more) _13  - 0 Hypo / Hyperglycemic Management (close monitor of Blood Glucose) _14  - 0 Ankle / Brachial Index (ABI) - do not check if billed separately X- 1 5 Vital Signs Has the patient been seen at the hospital within the last three years: Yes Total Score: 110 Level Of Care: New/Established - Level 3 Electronic Signature(s) Signed: 03/01/2022 5:16:03 PM By: Deon Pilling RN, BSN Entered By: Deon Pilling on 03/01/2022 11:35:49 -------------------------------------------------------------------------------- Encounter Discharge Information Details Patient Name: Date of Service: [name], Tyrone [name] 03/01/2022 11:00 A M Medical Record Number: [id number] Patient Account Number: [id number] Date of Birth/Sex: Treating RN: [birth year] (81 y.o. [name] Primary Care Camaya Gannett: Dimas Chyle Other Clinician: Referring Llewyn Heap: Treating  Jermarcus Mcfadyen/Extender: Jessee Avers in Treatment: 9 Encounter Discharge Information Items Discharge Condition: Stable Ambulatory Status: Cane Discharge Destination: Home Transportation: Private Auto Accompanied By: self Schedule Follow-up Appointment: Yes Clinical Summary of Care: Electronic Signature(s) Signed: 03/01/2022 5:16:03 PM By: Deon Pilling RN, BSN Entered By: Deon Pilling on 03/01/2022 11:36:17 -------------------------------------------------------------------------------- Lower Extremity Assessment Details Patient Name: Date of Service: [name], Tyrone [name] 03/01/2022 11:00 A M Medical Record Number: [id number] Patient Account Number: [id number] Date of Birth/Sex: Treating RN: 12/03/1940 (81 y.o. M) Primary Care Yamari Ventola: Dimas Chyle Other Clinician: Referring Daleisa Halperin: Treating Casia Corti/Extender: Laurena Slimmer Weeks in Treatment: 9 Edema Assessment Assessed: [Left: No] [Right: No] Edema: [Left: Ye] [Right: s] Calf Left: Right: Point of Measurement: 31 cm From Medial Instep 34.6 cm Ankle Left: Right: Point of Measurement: 9 cm From Medial Instep 23.5 cm Vascular Assessment Pulses: Dorsalis Pedis Palpable: [Left:Yes] Electronic Signature(s) Signed: 03/01/2022 3:37:19 PM By: Erenest Blank Entered By: Erenest Blank on 03/01/2022 11:14:30 -------------------------------------------------------------------------------- Multi Wound Chart Details Patient Name: Date of Service: [name], Tyrone [name] 03/01/2022 11:00 A M Medical Record Number: [id number] Patient Account Number: [id number] Date of Birth/Sex: Treating RN: 18-Jun-1940 (81 y.o. M) Primary Care Keishawn Darsey: Dimas Chyle Other Clinician: Referring Pierson Vantol: Treating Tanijah Morais/Extender: Jessee Avers in Treatment: 9 Vital Signs Height(in): 72 Capillary Blood Glucose(mg/dl): 97 Weight(lbs): 198 Pulse(bpm): 37 Body Mass Index(BMI): 26.9 Blood  Pressure(mmHg): 124/77 Temperature(F): 97.6 Respiratory Rate(breaths/min): 18 Photos: [N/A:N/A] Left, Plantar Foot N/A N/A Wound Location: Gradually Appeared N/A N/A Wounding Event: Diabetic Wound/Ulcer of the Lower N/A N/A Primary Etiology: Extremity Arrhythmia, Congestive Heart Failure, N/A N/A Comorbid History: Coronary Artery Disease, Type II Diabetes, Osteoarthritis, Neuropathy 01/24/2021 N/A N/A Date Acquired: 9 N/A N/A Weeks of Treatment: Open N/A N/A Wound Status:  No N/A N/A Wound Recurrence: 1.4x1.8x0.2 N/A N/A Measurements L x W x D (cm) 1.979 N/A N/A A (cm) : rea 0.396 N/A N/A Volume (cm) : -23.50% N/A N/A % Reduction in A rea: 17.70% N/A N/A % Reduction in Volume: Grade 1 N/A N/A Classification: Medium N/A N/A Exudate A mount: Serosanguineous N/A N/A Exudate Type: red, brown N/A N/A Exudate Color: Well defined, not attached N/A N/A Wound Margin: Large (67-100%) N/A N/A Granulation A mount: Red, Hyper-granulation N/A N/A Granulation Quality: None Present (0%) N/A N/A Necrotic A mount: Fat Layer (Subcutaneous Tissue): Yes N/A N/A Exposed Structures: Fascia: No Tendon: No Muscle: No Joint: No Bone: No None N/A N/A Epithelialization: Callus: Yes N/A N/A Periwound Skin Texture: Excoriation: No Induration: No Crepitus: No Rash: No Scarring: No Maceration: No N/A N/A Periwound Skin Moisture: Dry/Scaly: No Atrophie Blanche: No N/A N/A Periwound Skin Color: Cyanosis: No Ecchymosis: No Erythema: No Hemosiderin Staining: No Mottled: No Pallor: No Rubor: No No Abnormality N/A N/A Temperature: Treatment Notes Electronic Signature(s) Signed: 03/01/2022 4:20:55 PM By: Kalman Shan DO Entered By: Kalman Shan on 03/01/2022 11:30:18 -------------------------------------------------------------------------------- Multi-Disciplinary Care Plan Details Patient Name: Date of Service: [name], Tyrone [name] 03/01/2022 11:00 A  M Medical Record Number: [id number] Patient Account Number: [id number] Date of Birth/Sex: Treating RN: 10/30/1940 (81 y.o. [name] Primary Care Glennon Kopko: Dimas Chyle Other Clinician: Referring Draden Cottingham: Treating Prezley Qadir/Extender: Jessee Avers in Treatment: 9 Active Inactive Nutrition Nursing Diagnoses: Impaired glucose control: actual or potential Goals: Patient/caregiver verbalizes understanding of need to maintain therapeutic glucose control per primary care physician Date Initiated: 03/01/2022 Target Resolution Date: 03/29/2022 Goal Status: Active Interventions: Assess HgA1c results as ordered upon admission and as needed Treatment Activities: Obtain HgA1c : 03/01/2022 Notes: Wound/Skin Impairment Nursing Diagnoses: Impaired tissue integrity Goals: Patient/caregiver will verbalize understanding of skin care regimen Date Initiated: 12/26/2021 Date Inactivated: 03/01/2022 Target Resolution Date: 03/23/2022 Unmet Reason: patient refuses Goal Status: Unmet offloading, advance tissue products, and following providers plan of care. Ulcer/skin breakdown will have a volume reduction of 30% by week 4 Date Initiated: 12/26/2021 Date Inactivated: 01/23/2022 Target Resolution Date: 01/23/2022 Goal Status: Met Ulcer/skin breakdown will have a volume reduction of 50% by week 8 Date Initiated: 01/23/2022 Target Resolution Date: 03/23/2022 Goal Status: Active Interventions: Assess patient/caregiver ability to obtain necessary supplies Assess patient/caregiver ability to perform ulcer/skin care regimen upon admission and as needed Assess ulceration(s) every visit Provide education on ulcer and skin care Treatment Activities: Topical wound management initiated : 12/26/2021 Notes: 01/23/22: Wound care regimen continues. Wound greater than 30% volume reduction, new goal initiated. Electronic Signature(s) Signed: 03/01/2022 5:16:03 PM By: Deon Pilling RN,  BSN Entered By: Deon Pilling on 03/01/2022 11:35:06 -------------------------------------------------------------------------------- Pain Assessment Details Patient Name: Date of Service: [name], Tyrone [name] 03/01/2022 11:00 A M Medical Record Number: [id number] Patient Account Number: [id number] Date of Birth/Sex: Treating RN: Jan 10, 1941 (81 y.o. M) Primary Care Phylis Javed: Dimas Chyle Other Clinician: Referring Kenlie Seki: Treating Mekhi Lascola/Extender: Laurena Slimmer Weeks in Treatment: 9 Active Problems Location of Pain Severity and Description of Pain Patient Has Paino No Site Locations Pain Management and Medication Current Pain Management: Electronic Signature(s) Signed: 03/01/2022 3:37:19 PM By: Erenest Blank Entered By: Erenest Blank on 03/01/2022 11:01:47 -------------------------------------------------------------------------------- Patient/Caregiver Education Details Patient Name: Date of Service: [name], Tyrone [name] 10/5/2023andnbsp11:00 A M Medical Record Number: [id number] Patient Account Number: [id number] Date of Birth/Gender: Treating RN: 1940/12/24 (81 y.o. [name] Primary Care  Physician: Dimas Chyle Other Clinician: Referring Physician: Treating Physician/Extender: Jessee Avers in Treatment: 9 Education Assessment Education Provided To: Patient Education Topics Provided Wound/Skin Impairment: Handouts: Skin Care Do's and Dont's Methods: Explain/Verbal Responses: Reinforcements needed Electronic Signature(s) Signed: 03/01/2022 5:16:03 PM By: Deon Pilling RN, BSN Entered By: Deon Pilling on 03/01/2022 11:35:20 -------------------------------------------------------------------------------- Wound Assessment Details Patient Name: Date of Service: [name], Tyrone [name] 03/01/2022 11:00 A M Medical Record Number: [id number] Patient Account Number: [id number] Date of Birth/Sex: Treating RN: Mar 23, 1941 (81  y.o. M) Primary Care Devan Babino: Dimas Chyle Other Clinician: Referring Regis Hinton: Treating Daryl Beehler/Extender: Laurena Slimmer Weeks in Treatment: 9 Wound Status Wound Number: 1 Primary Diabetic Wound/Ulcer of the Lower Extremity Etiology: Wound Location: Left, Plantar Foot Wound Open Wounding Event: Gradually Appeared Status: Date Acquired: 01/24/2021 Comorbid Arrhythmia, Congestive Heart Failure, Coronary Artery Disease, Weeks Of Treatment: 9 History: Type II Diabetes, Osteoarthritis, Neuropathy Clustered Wound: No Photos Wound Measurements Length: (cm) 1.4 Width: (cm) 1.8 Depth: (cm) 0.2 Area: (cm) 1.979 Volume: (cm) 0.396 % Reduction in Area: -23.5% % Reduction in Volume: 17.7% Epithelialization: None Tunneling: No Undermining: No Wound Description Classification: Grade 1 Wound Margin: Well defined, not attached Exudate Amount: Medium Exudate Type: Serosanguineous Exudate Color: red, brown Foul Odor After Cleansing: No Slough/Fibrino No Wound Bed Granulation Amount: Large (67-100%) Exposed Structure Granulation Quality: Red, Hyper-granulation Fascia Exposed: No Necrotic Amount: None Present (0%) Fat Layer (Subcutaneous Tissue) Exposed: Yes Tendon Exposed: No Muscle Exposed: No Joint Exposed: No Bone Exposed: No Periwound Skin Texture Texture Color No Abnormalities Noted: No No Abnormalities Noted: Yes Callus: Yes Temperature / Pain Crepitus: No Temperature: No Abnormality Excoriation: No Induration: No Rash: No Scarring: No Moisture No Abnormalities Noted: Yes Treatment Notes Wound #1 (Foot) Wound Laterality: Plantar, Left Cleanser Soap and Water Discharge Instruction: May shower and wash wound with dial antibacterial soap and water prior to dressing change. Peri-Wound Care Topical Primary Dressing MediHoney Gel, tube 1.5 (oz) Discharge Instruction: Apply to wound bed as instructed Secondary Dressing ADAPTIC TOUCH 3x4.25  in Discharge Instruction: Apply over primary dressing as directed. Woven Gauze Sponge, Non-Sterile 4x4 in Discharge Instruction: Apply over primary dressing as directed. Secured With Elastic Bandage 4 inch (ACE bandage) Discharge Instruction: Secure with ACE bandage as directed. Kerlix Roll Sterile, 4.5x3.1 (in/yd) Discharge Instruction: Secure with Kerlix as directed. Compression Wrap Compression Stockings Add-Ons Electronic Signature(s) Signed: 03/01/2022 3:37:19 PM By: Erenest Blank Entered By: Erenest Blank on 03/01/2022 11:14:59 -------------------------------------------------------------------------------- Vitals Details Patient Name: Date of Service: [name], Tyrone [name] 03/01/2022 11:00 A M Medical Record Number: [id number] Patient Account Number: [id number] Date of Birth/Sex: Treating RN: 09-03-1940 (81 y.o. M) Primary Care Cherron Blitzer: Other Clinician: Dimas Chyle Referring Rashid Whitenight: Treating Quinterrius Errington/Extender: Jessee Avers in Treatment: 9 Vital Signs Time Taken: 11:01 Temperature (F): 97.6 Height (in): 72 Pulse (bpm): 65 Weight (lbs): 198 Respiratory Rate (breaths/min): 18 Body Mass Index (BMI): 26.9 Blood Pressure (mmHg): 124/77 Capillary Blood Glucose (mg/dl): 97 Reference Range: 80 - 120 mg / dl Electronic Signature(s) Signed: 03/01/2022 3:37:19 PM By: Erenest Blank Entered By: Erenest Blank on 03/01/2022 11:01:39

## 2022-03-01 NOTE — Progress Notes (Signed)
[name], [name] ([id number]) Visit Report for 03/01/2022 Chief Complaint Document Details Patient Name: Date of Service: [name] 03/01/2022 11:00 A M Medical Record Number: [id number] Patient Account Number: [id number] Date of Birth/Sex: Treating RN: Aug 29, 1940 (81 y.o. M) Primary Care Provider: Dimas Chyle Other Clinician: Referring Provider: Treating Provider/Extender: Laurena Slimmer Weeks in Treatment: 9 Information Obtained from: Patient Chief Complaint 12/26/2021; left foot plantar foot wound Electronic Signature(s) Signed: 03/01/2022 4:20:55 PM By: Kalman Shan DO Entered By: Kalman Shan on 03/01/2022 11:30:24 -------------------------------------------------------------------------------- HPI Details Patient Name: Date of Service: [name], Tyrone [name] 03/01/2022 11:00 A M Medical Record Number: [id number] Patient Account Number: [id number] Date of Birth/Sex: Treating RN: 1940/11/24 (81 y.o. M) Primary Care Provider: Dimas Chyle Other Clinician: Referring Provider: Treating Provider/Extender: Jessee Avers in Treatment: 9 History of Present Illness HPI Description: 12/26/2021 [name] is an 81 year old male with a past medical history of controlled type 2 diabetes, A-fib on Eliquis, chronic systolic heart failure on Entresto and right below knee amputation (2017) that presents to the clinic for a left plantar foot wound. He states this has been present for 1 year and is not sure how it started. He has peripheral neuropathy. He follows with Dr. Sherryle Lis, podiatry for this issue. He had an MRI on 10/16/2021 that did not show evidence of osteomyelitis. He has tried collagen with some benefit. Currently patient is keeping the area covered with a bandaid and no dressing. Due to his right BKA he has issues with balance and uses a cane. He is not a good candidate for the total contact cast. Surgical exostectomy has been thought  of for offloading however risk and recovery is high. Patient currently denies signs of infection. 8/15; patient presents for follow-up. He did not pick up her Granix from the pharmacy. He has been keeping the area covered. We discussed potentially doing a skin substitute and he was interested in this. He denies signs of infection. 8/29; patient presents for follow-up. He has been using Medihoney to the wound bed. He is not able to offload this area due to having a prosthesis. We have not heard back for insurance approval of a skin substitute. 9/12; patient presents for follow-up. We have been using Medihoney to the wound bed. He has no issues or complaints today. He has been approved for Grafix. We discussed using this at next clinic visit and he was agreeable. 9/21; patient presents for follow-up. He has been using Medihoney to the wound bed. Grafix is available and patient is agreeable to having this placed today. He knows to not get this wet and to return in 1 week. 9/28; this is a patient who has a wound on his left plantar foot for about 2-1/2 to 3 months. Previous BKA on the right which complicates our ability to offload this. We have been using Grafix and we reapplied this today postdebridement 10/5; patient presents for follow-up. He declines having the skin substitute placed again today. He wants to go back to Medihoney. He states he wants to be able to see the wound bed daily. Electronic Signature(s) Signed: 03/01/2022 4:20:55 PM By: Kalman Shan DO Signed: 03/01/2022 4:20:55 PM By: Kalman Shan DO Entered By: Kalman Shan on 03/01/2022 11:31:22 -------------------------------------------------------------------------------- Physical Exam Details Patient Name: Date of Service: [name], Tyrone [name] 03/01/2022 11:00 A M Medical Record Number: [id number] Patient Account Number: [id number] Date of Birth/Sex: Treating RN: 1941/01/24 (81 y.o. M) Primary  Care Provider: Dimas Chyle  Other Clinician: Referring Provider: Treating Provider/Extender: Jessee Avers in Treatment: 9 Constitutional respirations regular, non-labored and within target range for patient.. Cardiovascular 2+ dorsalis pedis/posterior tibialis pulses. Psychiatric pleasant and cooperative. Notes Left foot: T the plantar aspect there is an open wound with granulation tissue throughout. No signs of surrounding infection. o Electronic Signature(s) Signed: 03/01/2022 4:20:55 PM By: Kalman Shan DO Entered By: Kalman Shan on 03/01/2022 11:52:54 -------------------------------------------------------------------------------- Physician Orders Details Patient Name: Date of Service: [name], Tyrone [name] 03/01/2022 11:00 A M Medical Record Number: [id number] Patient Account Number: [id number] Date of Birth/Sex: Treating RN: January 27, 1941 (81 y.o. [name] Primary Care Provider: Dimas Chyle Other Clinician: Referring Provider: Treating Provider/Extender: Jessee Avers in Treatment: 9 Verbal / Phone Orders: No Diagnosis Coding ICD-10 Coding Code Description E11.621 Type 2 diabetes mellitus with foot ulcer L97.522 Non-pressure chronic ulcer of other part of left foot with fat layer exposed E11.42 Type 2 diabetes mellitus with diabetic polyneuropathy X44.81 Chronic systolic (congestive) heart failure I48.0 Paroxysmal atrial fibrillation Z89.511 Acquired absence of right leg below knee Follow-up Appointments Return appointment in 1 month. - Dr. Heber [place] Other: - [place] sells medihoney you can purchase. Anesthetic (In clinic) Topical Lidocaine 5% applied to wound bed Cellular or Tissue Based Products Cellular or Tissue Based Product Type: - Grafix PL Prime 2x3 #1, #2 02/22/22 Patient refused Grafix now. Bathing/ Shower/ Hygiene May shower and wash wound with soap and water. Edema Control - Lymphedema / SCD / Other Elevate legs  to the level of the heart or above for 30 minutes daily and/or when sitting, a frequency of: - several times a day Avoid standing for long periods of time. Patient to wear own compression stockings every day. Additional Orders / Instructions Follow Nutritious Diet - Monitor/Control Blood Sugars Wound Treatment Wound #1 - Foot Wound Laterality: Plantar, Left Cleanser: Soap and Water Every Other Day/30 Days Discharge Instructions: May shower and wash wound with dial antibacterial soap and water prior to dressing change. Prim Dressing: MediHoney Gel, tube 1.5 (oz) Every Other Day/30 Days ary Discharge Instructions: Apply to wound bed as instructed Secondary Dressing: ADAPTIC TOUCH 3x4.25 in Every Other Day/30 Days Discharge Instructions: Apply over primary dressing as directed. Secondary Dressing: Woven Gauze Sponge, Non-Sterile 4x4 in Every Other Day/30 Days Discharge Instructions: Apply over primary dressing as directed. Secured With: Elastic Bandage 4 inch (ACE bandage) Every Other Day/30 Days Discharge Instructions: Secure with ACE bandage as directed. Secured With: [employer], 4.5x3.1 (in/yd) Every Other Day/30 Days Discharge Instructions: Secure with Kerlix as directed. Electronic Signature(s) Signed: 03/01/2022 4:20:55 PM By: Kalman Shan DO Entered By: Kalman Shan on 03/01/2022 11:53:00 -------------------------------------------------------------------------------- Problem List Details Patient Name: Date of Service: [name], Tyrone [name] 03/01/2022 11:00 A M Medical Record Number: [id number] Patient Account Number: [id number] Date of Birth/Sex: Treating RN: [birth year] (81 y.o. [name] Primary Care Provider: Dimas Chyle Other Clinician: Referring Provider: Treating Provider/Extender: Laurena Slimmer Weeks in Treatment: 9 Active Problems ICD-10 Encounter Code Description Active Date MDM Diagnosis E11.621 Type 2 diabetes mellitus with  foot ulcer 12/26/2021 No Yes L97.522 Non-pressure chronic ulcer of other part of left foot with fat layer exposed 12/26/2021 No Yes E11.42 Type 2 diabetes mellitus with diabetic polyneuropathy 12/26/2021 No Yes Y63.78 Chronic systolic (congestive) heart failure 12/26/2021 No Yes I48.0 Paroxysmal atrial fibrillation 12/26/2021 No Yes Z89.511 Acquired absence of right leg below knee 12/26/2021 No  Yes Inactive Problems Resolved Problems Electronic Signature(s) Signed: 03/01/2022 4:20:55 PM By: Kalman Shan DO Entered By: Kalman Shan on 03/01/2022 11:30:12 -------------------------------------------------------------------------------- Progress Note Details Patient Name: Date of Service: [name], Tyrone [name] 03/01/2022 11:00 A M Medical Record Number: [id number] Patient Account Number: [id number] Date of Birth/Sex: Treating RN: January 30, 1941 (81 y.o. M) Primary Care Provider: Dimas Chyle Other Clinician: Referring Provider: Treating Provider/Extender: Jessee Avers in Treatment: 9 Subjective Chief Complaint Information obtained from Patient 12/26/2021; left foot plantar foot wound History of Present Illness (HPI) 12/26/2021 [name] is an 80 year old male with a past medical history of controlled type 2 diabetes, A-fib on Eliquis, chronic systolic heart failure on Entresto and right below knee amputation (2017) that presents to the clinic for a left plantar foot wound. He states this has been present for 1 year and is not sure how it started. He has peripheral neuropathy. He follows with Dr. Sherryle Lis, podiatry for this issue. He had an MRI on 10/16/2021 that did not show evidence of osteomyelitis. He has tried collagen with some benefit. Currently patient is keeping the area covered with a bandaid and no dressing. Due to his right BKA he has issues with balance and uses a cane. He is not a good candidate for the total contact cast. Surgical exostectomy has been thought of  for offloading however risk and recovery is high. Patient currently denies signs of infection. 8/15; patient presents for follow-up. He did not pick up her Granix from the pharmacy. He has been keeping the area covered. We discussed potentially doing a skin substitute and he was interested in this. He denies signs of infection. 8/29; patient presents for follow-up. He has been using Medihoney to the wound bed. He is not able to offload this area due to having a prosthesis. We have not heard back for insurance approval of a skin substitute. 9/12; patient presents for follow-up. We have been using Medihoney to the wound bed. He has no issues or complaints today. He has been approved for Grafix. We discussed using this at next clinic visit and he was agreeable. 9/21; patient presents for follow-up. He has been using Medihoney to the wound bed. Grafix is available and patient is agreeable to having this placed today. He knows to not get this wet and to return in 1 week. 9/28; this is a patient who has a wound on his left plantar foot for about 2-1/2 to 3 months. Previous BKA on the right which complicates our ability to offload this. We have been using Grafix and we reapplied this today postdebridement 10/5; patient presents for follow-up. He declines having the skin substitute placed again today. He wants to go back to Medihoney. He states he wants to be able to see the wound bed daily. Patient History Information obtained from Patient, Chart. Family History Heart Disease - Siblings, Hypertension - Siblings, Stroke - Father, No family history of Cancer, Diabetes, Hereditary Spherocytosis, Kidney Disease, Lung Disease, Seizures, Thyroid Problems, Tuberculosis. Social History Former smoker - Quit in 1983, Marital Status - Widowed, Alcohol Use - Rarely, Drug Use - No History, Caffeine Use - Rarely. Medical History Cardiovascular Patient has history of Arrhythmia, Congestive Heart Failure, Coronary  Artery Disease Endocrine Patient has history of Type II Diabetes Musculoskeletal Patient has history of Osteoarthritis Neurologic Patient has history of Neuropathy Medical A Surgical History Notes nd Genitourinary Hx BPH Oncologic Basal Cell Carcinoma (Face) Objective Constitutional respirations regular, non-labored and within target range for  patient.. Vitals Time Taken: 11:01 AM, Height: 72 in, Weight: 198 lbs, BMI: 26.9, Temperature: 97.6 F, Pulse: 65 bpm, Respiratory Rate: 18 breaths/min, Blood Pressure: 124/77 mmHg, Capillary Blood Glucose: 97 mg/dl. Cardiovascular 2+ dorsalis pedis/posterior tibialis pulses. Psychiatric pleasant and cooperative. General Notes: Left foot: T the plantar aspect there is an open wound with granulation tissue throughout. No signs of surrounding infection. o Integumentary (Hair, Skin) Wound #1 status is Open. Original cause of wound was Gradually Appeared. The date acquired was: 01/24/2021. The wound has been in treatment 9 weeks. The wound is located on the [place]. The wound measures 1.4cm length x 1.8cm width x 0.2cm depth; 1.979cm^2 area and 0.396cm^3 volume. There is Fat Layer (Subcutaneous Tissue) exposed. There is no tunneling or undermining noted. There is a medium amount of serosanguineous drainage noted. The wound margin is well defined and not attached to the wound base. There is large (67-100%) red, hyper - granulation within the wound bed. There is no necrotic tissue within the wound bed. The periwound skin appearance had no abnormalities noted for moisture. The periwound skin appearance had no abnormalities noted for color. The periwound skin appearance exhibited: Callus. The periwound skin appearance did not exhibit: Crepitus, Excoriation, Induration, Rash, Scarring. Periwound temperature was noted as No Abnormality. Assessment Active Problems ICD-10 Type 2 diabetes mellitus with foot ulcer Non-pressure chronic ulcer of  other part of left foot with fat layer exposed Type 2 diabetes mellitus with diabetic polyneuropathy Chronic systolic (congestive) heart failure Paroxysmal atrial fibrillation Acquired absence of right leg below knee Patient's wound appears well-healing. There has been significant improvement in appearance since the start of Grafix. Unfortunately patient does not want to have Grafix placed he states that he wants to see the wound bed every day. We discussed how well he has improved with Grafix. I stated that there is a high risk that he will get worse If you are not to continue this. He accepts this risk Even if it leads to amputation. He wants to go back to Medihoney. He has a very hard time offloading this wound due to his right prosthesis. I recommended an offloading insert In the past. I do not think he has obtained this. We will use a foam donut padding. Plan Follow-up Appointments: Return appointment in 1 month. - Dr. Heber [place] Other: - [place] sells medihoney you can purchase. Anesthetic: (In clinic) Topical Lidocaine 5% applied to wound bed Cellular or Tissue Based Products: Cellular or Tissue Based Product Type: - Grafix PL Prime 2x3 #1, #2 02/22/22 Patient refused Grafix now. Bathing/ Shower/ Hygiene: May shower and wash wound with soap and water. Edema Control - Lymphedema / SCD / Other: Elevate legs to the level of the heart or above for 30 minutes daily and/or when sitting, a frequency of: - several times a day Avoid standing for long periods of time. Patient to wear own compression stockings every day. Additional Orders / Instructions: Follow Nutritious Diet - Monitor/Control Blood Sugars WOUND #1: - Foot Wound Laterality: Plantar, Left Cleanser: Soap and Water Every Other Day/30 Days Discharge Instructions: May shower and wash wound with dial antibacterial soap and water prior to dressing change. Prim Dressing: MediHoney Gel, tube 1.5 (oz) Every Other Day/30  Days ary Discharge Instructions: Apply to wound bed as instructed Secondary Dressing: ADAPTIC TOUCH 3x4.25 in Every Other Day/30 Days Discharge Instructions: Apply over primary dressing as directed. Secondary Dressing: Woven Gauze Sponge, Non-Sterile 4x4 in Every Other Day/30 Days Discharge Instructions: Apply over  primary dressing as directed. Secured With: Elastic Bandage 4 inch (ACE bandage) Every Other Day/30 Days Discharge Instructions: Secure with ACE bandage as directed. Secured With: [employer], 4.5x3.1 (in/yd) Every Other Day/30 Days Discharge Instructions: Secure with Kerlix as directed. 1. Medihoney 2. Aggressive offloading 3. Follow-up in 1 month Electronic Signature(s) Signed: 03/01/2022 4:20:55 PM By: Kalman Shan DO Entered By: Kalman Shan on 03/01/2022 11:59:50 -------------------------------------------------------------------------------- HxROS Details Patient Name: Date of Service: [name], Tyrone [name] 03/01/2022 11:00 A M Medical Record Number: [id number] Patient Account Number: [id number] Date of Birth/Sex: Treating RN: [birth year] (81 y.o. M) Primary Care Provider: Dimas Chyle Other Clinician: Referring Provider: Treating Provider/Extender: Jessee Avers in Treatment: 9 Information Obtained From Patient Chart Cardiovascular Medical History: Positive for: Arrhythmia; Congestive Heart Failure; Coronary Artery Disease Endocrine Medical History: Positive for: Type II Diabetes Treated with: Oral agents Genitourinary Medical History: Past Medical History Notes: Hx BPH Musculoskeletal Medical History: Positive for: Osteoarthritis Neurologic Medical History: Positive for: Neuropathy Oncologic Medical History: Past Medical History Notes: Basal Cell Carcinoma (Face) Immunizations Pneumococcal Vaccine: Received Pneumococcal Vaccination: Yes Received Pneumococcal Vaccination On or After 60th Birthday:  Yes Implantable Devices None Family and Social History Cancer: No; Diabetes: No; Heart Disease: Yes - Siblings; Hereditary Spherocytosis: No; Hypertension: Yes - Siblings; Kidney Disease: No; Lung Disease: No; Seizures: No; Stroke: Yes - Father; Thyroid Problems: No; Tuberculosis: No; Former smoker - Quit in 1983; Marital Status - Widowed; Alcohol Use: Rarely; Drug Use: No History; Caffeine Use: Rarely; Financial Concerns: No; Food, Clothing or Shelter Needs: No; Support System Lacking: No; Transportation Concerns: No Electronic Signature(s) Signed: 03/01/2022 4:20:55 PM By: Kalman Shan DO Entered By: Kalman Shan on 03/01/2022 11:32:32 -------------------------------------------------------------------------------- SuperBill Details Patient Name: Date of Service: [name], Tyrone [name] 03/01/2022 Medical Record Number: [id number] Patient Account Number: [id number] Date of Birth/Sex: Treating RN: [birth year] (81 y.o. [name] Primary Care Provider: Dimas Chyle Other Clinician: Referring Provider: Treating Provider/Extender: Laurena Slimmer Weeks in Treatment: 9 Diagnosis Coding ICD-10 Codes Code Description E11.621 Type 2 diabetes mellitus with foot ulcer L97.522 Non-pressure chronic ulcer of other part of left foot with fat layer exposed E11.42 Type 2 diabetes mellitus with diabetic polyneuropathy P53.00 Chronic systolic (congestive) heart failure I48.0 Paroxysmal atrial fibrillation Z89.511 Acquired absence of right leg below knee Facility Procedures CPT4 Code: 51102111 Description: 99213 - WOUND CARE VISIT-LEV 3 EST PT Modifier: Quantity: 1 Physician Procedures : CPT4 Code Description Modifier [id number] - WC PHYS LEVEL 3 - EST PT ICD-10 Diagnosis Description E11.621 Type 2 diabetes mellitus with foot ulcer L97.522 Non-pressure chronic ulcer of other part of left foot with fat layer exposed E11.42 Type 2  diabetes mellitus with diabetic  polyneuropathy Z89.511 Acquired absence of right leg below knee Quantity: 1 Electronic Signature(s) Signed: 03/01/2022 4:20:55 PM By: Kalman Shan DO Entered By: Kalman Shan on 03/01/2022 12:00:20

## 2022-03-23 ENCOUNTER — Telehealth: Payer: Self-pay | Admitting: Family Medicine

## 2022-03-23 NOTE — Telephone Encounter (Signed)
Ok with me. Please place any necessary orders. 

## 2022-03-23 NOTE — Telephone Encounter (Signed)
Ok to send DME?

## 2022-03-23 NOTE — Telephone Encounter (Signed)
Daughter requesting-    .Marland KitchenWhat type of DME (Durable Medical Equipment) would you like your provider to order:    WheelChair  Who and where does the order need to be sent:     [facility] on [address]. [phone]   Comments:    Caller would like a return phone call when orders are complete.  Current wheelchair cannot be repaired any further.  Next visit is 04/02/22

## 2022-03-28 NOTE — Telephone Encounter (Signed)
DME Wheel Chair faxed to [phone]

## 2022-03-30 NOTE — Telephone Encounter (Signed)
Aniceto Boss with [facility] on [address] states Order for Wheel Chair needs to include Progress Notes and Diagnosis Code  Fax# [phone]

## 2022-04-02 ENCOUNTER — Ambulatory Visit (HOSPITAL_BASED_OUTPATIENT_CLINIC_OR_DEPARTMENT_OTHER): Payer: Medicare Other | Admitting: Internal Medicine

## 2022-04-02 ENCOUNTER — Ambulatory Visit: Payer: Medicare Other | Admitting: Family Medicine

## 2022-04-02 NOTE — Telephone Encounter (Signed)
Left message to return call to our office at their convenience.   Patient need OV or VV for wheel chair DME

## 2022-04-03 NOTE — Telephone Encounter (Signed)
Call patient daughter [name] [phone] LVM patient need OV for DME Wheel chair

## 2022-04-05 ENCOUNTER — Encounter (HOSPITAL_BASED_OUTPATIENT_CLINIC_OR_DEPARTMENT_OTHER): Payer: Medicare Other | Attending: Internal Medicine | Admitting: Internal Medicine

## 2022-04-05 DIAGNOSIS — E11621 Type 2 diabetes mellitus with foot ulcer: Secondary | ICD-10-CM | POA: Insufficient documentation

## 2022-04-05 DIAGNOSIS — M1990 Unspecified osteoarthritis, unspecified site: Secondary | ICD-10-CM | POA: Insufficient documentation

## 2022-04-05 DIAGNOSIS — Z7901 Long term (current) use of anticoagulants: Secondary | ICD-10-CM | POA: Diagnosis not present

## 2022-04-05 DIAGNOSIS — L97522 Non-pressure chronic ulcer of other part of left foot with fat layer exposed: Secondary | ICD-10-CM | POA: Diagnosis not present

## 2022-04-05 DIAGNOSIS — I5022 Chronic systolic (congestive) heart failure: Secondary | ICD-10-CM | POA: Diagnosis not present

## 2022-04-05 DIAGNOSIS — I2510 Atherosclerotic heart disease of native coronary artery without angina pectoris: Secondary | ICD-10-CM | POA: Insufficient documentation

## 2022-04-05 DIAGNOSIS — Z89511 Acquired absence of right leg below knee: Secondary | ICD-10-CM | POA: Insufficient documentation

## 2022-04-05 DIAGNOSIS — I480 Paroxysmal atrial fibrillation: Secondary | ICD-10-CM | POA: Insufficient documentation

## 2022-04-05 DIAGNOSIS — E1142 Type 2 diabetes mellitus with diabetic polyneuropathy: Secondary | ICD-10-CM | POA: Insufficient documentation

## 2022-04-09 NOTE — Progress Notes (Signed)
RASHUN, GRATTAN ([id number]) [id number] Page 1 of 8 Visit Report for 04/05/2022 Arrival Information Details Patient Name: Date of Service: [name] 04/05/2022 11:15 A M Medical Record Number: [id number] Patient Account Number: [id number] Date of Birth/Sex: Treating RN: Nov Roberts, [name] (81 y.o. [name] Primary Care Jonty Morrical: Dimas Chyle Other Clinician: Referring Fancy Dunkley: Treating Terrisa Curfman/Extender: Jessee Avers in Treatment: 14 Visit Information History Since Last Visit Added or deleted any medications: No Patient Arrived: [name] Any new allergies or adverse reactions: No Arrival Time: 11:06 Had a fall or experienced change in No Accompanied By: self activities of daily living that may affect Transfer Assistance: None risk of falls: Patient Identification Verified: Yes Signs or symptoms of abuse/neglect since last visito No Secondary Verification Process Completed: Yes Hospitalized since last visit: No Patient Requires Transmission-Based Precautions: No Implantable device outside of the clinic excluding No Patient Has Alerts: Yes cellular tissue based products placed in the center Patient Alerts: Patient on Blood Thinner since last visit: ABI: R=Non Comp Has Dressing in Place as Prescribed: Yes Pain Present Now: No Electronic Signature(s) Signed: 04/05/2022 3:49:20 PM By: Adline Peals Entered By: Adline Peals on 04/05/2022 11:06:45 -------------------------------------------------------------------------------- Encounter Discharge Information Details Patient Name: Date of Service: [name], [name]. 04/05/2022 11:15 A M Medical Record Number: [id number] Patient Account Number: [id number] Date of Birth/Sex: Treating RN: [name] (81 y.o. [name] Primary Care Xzavien Harada: Dimas Chyle Other Clinician: Referring Rhyker Silversmith: Treating Bailley Guilford/Extender: Jessee Avers  in Treatment: 14 Encounter Discharge Information Items Discharge Condition: Stable Ambulatory Status: Ambulatory Discharge Destination: Home Transportation: Private Auto Accompanied By: self Schedule Follow-up Appointment: Yes Clinical Summary of Care: Patient Declined Electronic Signature(s) Signed: 04/09/2022 4:10:29 PM By: Rhae Hammock RN Entered By: Rhae Hammock on 04/05/2022 11:57:54 Doland, [name] ([id number]) [id number] Page 2 of 8 -------------------------------------------------------------------------------- Lower Extremity Assessment Details Patient Name: Date of Service: [name]. 04/05/2022 11:15 A M Medical Record Number: [id number] Patient Account Number: [id number] Date of Birth/Sex: Treating RN: [name] (81 y.o. [name] Primary Care Arieonna Medine: Dimas Chyle Other Clinician: Referring Loi Rennaker: Treating Monterrio Gerst/Extender: Laurena Slimmer Weeks in Treatment: 14 Edema Assessment Assessed: [Left: No] [Right: No] Edema: [Left: Ye] [Right: s] Calf Left: Right: Point of Measurement: 31 cm From Medial Instep 34.6 cm Ankle Left: Right: Point of Measurement: 9 cm From Medial Instep 23.5 cm Electronic Signature(s) Signed: 04/05/2022 3:49:20 PM By: Adline Peals Entered By: Adline Peals on 04/05/2022 11:07:40 -------------------------------------------------------------------------------- Multi Wound Chart Details Patient Name: Date of Service: [name], [name]. 04/05/2022 11:15 A M Medical Record Number: [id number] Patient Account Number: [id number] Date of Birth/Sex: Treating RN: [name] (81 y.o. M) Primary Care Mackensey Bolte: Dimas Chyle Other Clinician: Referring Mordecai Tindol: Treating Aliegha Paullin/Extender: Jessee Avers in Treatment: 14 Vital Signs Height(in): 72 Capillary Blood Glucose(mg/dl): 103 Weight(lbs): 198 Pulse(bpm): 73 Body Mass Index(BMI):  26.9 Blood Pressure(mmHg): 105/56 Temperature(F): 97.7 Respiratory Rate(breaths/min): 18 [1:Photos:] [N/A:N/A] Left, Plantar Foot N/A N/A Wound Location: Gradually Appeared N/A N/A Wounding Event: Diabetic Wound/Ulcer of the Lower N/A N/A Primary Etiology: Extremity Arrhythmia, Congestive Heart Failure, N/A N/A Comorbid History: Coronary Artery Disease, Type II [name], [name] ([id number]) [id number] Page 3 of 8 Diabetes, Osteoarthritis, Neuropathy 01/24/2021 N/A N/A Date Acquired: 14 N/A N/A Weeks of Treatment: Open N/A N/A Wound Status: No N/A N/A Wound Recurrence: 1.3x1.5x0.2 N/A N/A Measurements L x W x D (cm) 1.532 N/A N/A A (cm) : rea  0.306 N/A N/A Volume (cm) : 4.40% N/A N/A % Reduction in A rea: 36.40% N/A N/A % Reduction in Volume: 1 Starting Position 1 (o'clock): 6 Ending Position 1 (o'clock): 0.6 Maximum Distance 1 (cm): Yes N/A N/A Undermining: Grade 1 N/A N/A Classification: Medium N/A N/A Exudate A mount: Serosanguineous N/A N/A Exudate Type: red, brown N/A N/A Exudate Color: Well defined, not attached N/A N/A Wound Margin: Large (67-100%) N/A N/A Granulation A mount: Red, Hyper-granulation N/A N/A Granulation Quality: None Present (0%) N/A N/A Necrotic A mount: Fat Layer (Subcutaneous Tissue): Yes N/A N/A Exposed Structures: Fascia: No Tendon: No Muscle: No Joint: No Bone: No Small (1-33%) N/A N/A Epithelialization: Callus: Yes N/A N/A Periwound Skin Texture: Excoriation: No Induration: No Crepitus: No Rash: No Scarring: No Maceration: No N/A N/A Periwound Skin Moisture: Dry/Scaly: No Atrophie Blanche: No N/A N/A Periwound Skin Color: Cyanosis: No Ecchymosis: No Erythema: No Hemosiderin Staining: No Mottled: No Pallor: No Rubor: No No Abnormality N/A N/A Temperature: Chemical Cauterization N/A N/A Procedures Performed: Treatment Notes Wound #1 (Foot) Wound Laterality: Plantar,  Left Cleanser Soap and Water Discharge Instruction: May shower and wash wound with dial antibacterial soap and water prior to dressing change. Peri-Wound Care Topical Primary Dressing KerraCel Ag Gelling Fiber Dressing, 4x5 in (silver alginate) Discharge Instruction: Apply silver alginate to wound bed as instructed MediHoney Gel, tube 1.5 (oz) Discharge Instruction: Apply to wound bed as instructed Secondary Dressing ADAPTIC TOUCH 3x4.25 in Discharge Instruction: Apply over primary dressing as directed. Woven Gauze Sponge, Non-Sterile 4x4 in Discharge Instruction: Apply over primary dressing as directed. Secured With Elastic Bandage 4 inch (ACE bandage) Discharge Instruction: Secure with ACE bandage as directed. Kerlix Roll Sterile, 4.5x3.1 (in/yd) Discharge Instruction: Secure with Kerlix as directed. Compression Wrap Compression Stockings [name], [name] ([id number]) [id number] Page 4 of 8 Add-Ons Electronic Signature(s) Signed: 04/05/2022 1:36:02 PM By: Kalman Shan DO Entered By: Kalman Shan on 04/05/2022 13:18:35 -------------------------------------------------------------------------------- Multi-Disciplinary Care Plan Details Patient Name: Date of Service: [name], [name]. 04/05/2022 11:15 A M Medical Record Number: [id number] Patient Account Number: [id number] Date of Birth/Sex: Treating RN: [name][birth year] (81 y.o. [name], Lauren Primary Care Shamir Tuzzolino: Dimas Chyle Other Clinician: Referring Rhylynn Perdomo: Treating Blase Beckner/Extender: Jessee Avers in Treatment: 14 Active Inactive Nutrition Nursing Diagnoses: Impaired glucose control: actual or potential Goals: Patient/caregiver verbalizes understanding of need to maintain therapeutic glucose control per primary care physician Date Initiated: 03/01/2022 Target Resolution Date: 03/29/2022 Goal Status: Active Interventions: Assess HgA1c results as ordered upon  admission and as needed Treatment Activities: Obtain HgA1c : 03/01/2022 Notes: Wound/Skin Impairment Nursing Diagnoses: Impaired tissue integrity Goals: Patient/caregiver will verbalize understanding of skin care regimen Date Initiated: 12/26/2021 Date Inactivated: 03/01/2022 Target Resolution Date: 10/Roberts/2023 Unmet Reason: patient refuses Goal Status: Unmet offloading, advance tissue products, and following providers plan of care. Ulcer/skin breakdown will have a volume reduction of 30% by week 4 Date Initiated: 12/26/2021 Date Inactivated: 01/23/2022 Target Resolution Date: 01/23/2022 Goal Status: Met Ulcer/skin breakdown will have a volume reduction of 50% by week 8 Date Initiated: 01/23/2022 Target Resolution Date: 10/Roberts/2023 Goal Status: Active Interventions: Assess patient/caregiver ability to obtain necessary supplies Assess patient/caregiver ability to perform ulcer/skin care regimen upon admission and as needed Assess ulceration(s) every visit Provide education on ulcer and skin care Treatment Activities: Topical wound management initiated : 12/26/2021 Notes: 01/23/22: Wound care regimen continues. Wound greater than 30% volume reduction, new goal initiated. Pt. not compliant to recommendations/ plan of care prescribed by Dr. Heber Knippa.  [name], [name] ([id number]) [id number] Page 5 of 8 Electronic Signature(s) Signed: 04/09/2022 4:10:29 PM By: Rhae Hammock RN Entered By: Rhae Hammock on 04/05/2022 11:55:17 -------------------------------------------------------------------------------- Pain Assessment Details Patient Name: Date of Service: [name], [name]. 04/05/2022 11:15 A M Medical Record Number: [id number] Patient Account Number: [id number] Date of Birth/Sex: Treating RN: [name][birth year] (81 y.o. [name] Primary Care Bahja Bence: Dimas Chyle Other Clinician: Referring Dorita Rowlands: Treating Zayon Trulson/Extender: Jessee Avers in Treatment: 14 Active Problems Location of Pain Severity and Description of Pain Patient Has Paino No Site Locations Rate the pain. Current Pain Level: 0 Pain Management and Medication Current Pain Management: Electronic Signature(s) Signed: 04/05/2022 3:49:20 PM By: Adline Peals Entered By: Adline Peals on 04/05/2022 11:06:53 -------------------------------------------------------------------------------- Patient/Caregiver Education Details Patient Name: Date of Service: [name], [name]. 11/9/2023andnbsp11:15 A M Medical Record Number: [id number] Patient Account Number: [id number] Date of Birth/Gender: Treating RN: [name] (81 y.o. [name] Primary Care Physician: Dimas Chyle Other Clinician: Referring Physician: Treating Physician/Extender: Jessee Avers in Treatment: 14 Education Assessment Education Provided To: Patient [name], [name] ([id number]) [id number] Page 6 of 8 Education Topics Provided Wound/Skin Impairment: Methods: Explain/Verbal Responses: State content correctly Electronic Signature(s) Signed: 04/09/2022 4:10:29 PM By: Rhae Hammock RN Entered By: Rhae Hammock on 04/05/2022 11:55:28 -------------------------------------------------------------------------------- Wound Assessment Details Patient Name: Date of Service: [name], [name]. 04/05/2022 11:15 A M Medical Record Number: [id number] Patient Account Number: [id number] Date of Birth/Sex: Treating RN: [name], [name] (81 y.o. [name] Primary Care Viktoria Gruetzmacher: Dimas Chyle Other Clinician: Referring Casee Knepp: Treating Elvin Banker/Extender: Laurena Slimmer Weeks in Treatment: 14 Wound Status Wound Number: 1 Primary Diabetic Wound/Ulcer of the Lower Extremity Etiology: Wound Location: Left, Plantar Foot Wound Open Wounding Event: Gradually Appeared Status: Date  Acquired: 01/24/2021 Comorbid Arrhythmia, Congestive Heart Failure, Coronary Artery Disease, Weeks Of Treatment: 14 History: Type II Diabetes, Osteoarthritis, Neuropathy Clustered Wound: No Photos Wound Measurements Length: (cm) 1.3 Width: (cm) 1.5 Depth: (cm) 0.2 Area: (cm) 1.532 Volume: (cm) 0.306 % Reduction in Area: 4.4% % Reduction in Volume: 36.4% Epithelialization: Small (1-33%) Tunneling: No Undermining: Yes Starting Position (o'clock): 1 Ending Position (o'clock): 6 Maximum Distance: (cm) 0.6 Wound Description Classification: Grade 1 Wound Margin: Well defined, not attached Exudate Amount: Medium Exudate Type: Serosanguineous Exudate Color: red, brown Foul Odor After Cleansing: No Slough/Fibrino No Wound Bed Granulation Amount: Large (67-100%) Exposed Structure Granulation Quality: Red, Hyper-granulation Fascia Exposed: No Necrotic Amount: None Present (0%) Fat Layer (Subcutaneous Tissue) Exposed: Yes Tendon Exposed: No [name], [name] ([id number]) [id number] Page 7 of 8 Muscle Exposed: No Joint Exposed: No Bone Exposed: No Periwound Skin Texture Texture Color No Abnormalities Noted: No No Abnormalities Noted: Yes Callus: Yes Temperature / Pain Crepitus: No Temperature: No Abnormality Excoriation: No Induration: No Rash: No Scarring: No Moisture No Abnormalities Noted: Yes Treatment Notes Wound #1 (Foot) Wound Laterality: Plantar, Left Cleanser Soap and Water Discharge Instruction: May shower and wash wound with dial antibacterial soap and water prior to dressing change. Peri-Wound Care Topical Primary Dressing KerraCel Ag Gelling Fiber Dressing, 4x5 in (silver alginate) Discharge Instruction: Apply silver alginate to wound bed as instructed MediHoney Gel, tube 1.5 (oz) Discharge Instruction: Apply to wound bed as instructed Secondary Dressing ADAPTIC TOUCH 3x4.25 in Discharge Instruction: Apply over primary dressing  as directed. Woven Gauze Sponge, Non-Sterile 4x4 in Discharge Instruction: Apply over primary dressing as directed. Secured With Elastic Bandage 4 inch (ACE bandage) Discharge Instruction:  Secure with ACE bandage as directed. Kerlix Roll Sterile, 4.5x3.1 (in/yd) Discharge Instruction: Secure with Kerlix as directed. Compression Wrap Compression Stockings Add-Ons Electronic Signature(s) Signed: 04/05/2022 3:31:30 PM By: Erenest Blank Signed: 04/05/2022 3:49:20 PM By: Sabas Sous By: Erenest Blank on 04/05/2022 11:14:00 -------------------------------------------------------------------------------- Vitals Details Patient Name: Date of Service: [name], [name]. 04/05/2022 11:15 A M Medical Record Number: [id number] Patient Account Number: [id number] Date of Birth/Sex: Treating RN: [birth year], [name] (81 y.o. [name] Primary Care Masako Overall: Dimas Chyle Other Clinician: Referring Bud Kaeser: Treating Sean Macwilliams/Extender: Jessee Avers in Treatment: [address] KING, PINZON Roberts ([id number]) [id number] Page 8 of 8 Time Taken: 11:06 Temperature (F): 97.7 Height (in): 72 Pulse (bpm): 73 Weight (lbs): 198 Respiratory Rate (breaths/min): 18 Body Mass Index (BMI): 26.9 Blood Pressure (mmHg): 105/56 Capillary Blood Glucose (mg/dl): 103 Reference Range: 80 - 120 mg / dl Electronic Signature(s) Signed: 04/05/2022 3:49:20 PM By: Adline Peals Entered By: Adline Peals on 04/05/2022 11:07:35

## 2022-04-09 NOTE — Progress Notes (Signed)
Tyrone, Roberts ([id number]) [id number] Page 1 of 8 Visit Report for 04/05/2022 Chief Complaint Document Details Patient Name: Date of Service: [name] 04/05/2022 11:15 A M Medical Record Number: [id number] Patient Account Number: [id number] Date of Birth/Sex: Treating RN: 1940/12/11 (81 y.o. M) Primary Care Provider: Dimas Roberts Other Clinician: Referring Provider: Treating Provider/Extender: [name] in Treatment: 14 Information Obtained from: Patient Chief Complaint 12/26/2021; left foot plantar foot wound Electronic Signature(s) Signed: 04/05/2022 1:36:02 PM By: Tyrone Shan DO Entered By: [name] on 04/05/2022 13:18:43 -------------------------------------------------------------------------------- HPI Details Patient Name: Date of Service: [name], [name]. 04/05/2022 11:15 A M Medical Record Number: [id number] Patient Account Number: [id number] Date of Birth/Sex: Treating RN: [birth year] (81 y.o. M) Primary Care Provider: Dimas Roberts Other Clinician: Referring Provider: Treating Provider/Extender: [name] in Treatment: 14 History of Present Illness HPI Description: 12/26/2021 [name] is an 81 year old male with a past medical history of controlled type 2 diabetes, A-fib on Eliquis, chronic systolic heart failure on Entresto and right below knee amputation (2017) that presents to the clinic for a left plantar foot wound. He states this has been present for 1 year and is not sure how it started. He has peripheral neuropathy. He follows with Dr. Sherryle Lis, podiatry for this issue. He had an MRI on 10/16/2021 that did not show evidence of osteomyelitis. He has tried collagen with some benefit. Currently patient is keeping the area covered with a bandaid and no dressing. Due to his right BKA he has issues with balance and uses a cane. He is not a good candidate for the  total contact cast. Surgical exostectomy has been thought of for offloading however risk and recovery is high. Patient currently denies signs of infection. 8/15; patient presents for follow-up. He did not pick up her Granix from the pharmacy. He has been keeping the area covered. We discussed potentially doing a skin substitute and he was interested in this. He denies signs of infection. 8/29; patient presents for follow-up. He has been using Medihoney to the wound bed. He is not able to offload this area due to having a prosthesis. We have not heard back for insurance approval of a skin substitute. 9/12; patient presents for follow-up. We have been using Medihoney to the wound bed. He has no issues or complaints today. He has been approved for Grafix. We discussed using this at next clinic visit and he was agreeable. 9/21; patient presents for follow-up. He has been using Medihoney to the wound bed. Grafix is available and patient is agreeable to having this placed today. He knows to not get this wet and to return in 1 week. 9/28; this is a patient who has a wound on his left plantar foot for about 2-1/2 to 3 months. Previous BKA on the right which complicates our ability to offload this. We have been using Grafix and we reapplied this today postdebridement 10/5; patient presents for follow-up. He declines having the skin substitute placed again today. He wants to go back to Medihoney. He states he wants to be able to see the wound bed daily. 11/9; patient presents for follow-up. He has been using Medihoney daily to the wound bed. Electronic Signature(s) [name], [name] ([id number]) [id number] Page 2 of 8 Signed: 04/05/2022 1:36:02 PM By: Tyrone Shan DO Entered By: [name] on 04/05/2022 13:19:19 -------------------------------------------------------------------------------- Chemical Cauterization Details Patient Name: Date of Service: [name], [name].  04/05/2022 11:15 A M Medical Record Number: [id number] Patient Account Number: [id number] Date of Birth/Sex: Treating RN: [birth year] (81 y.o. [name], [name] Primary Care Provider: Dimas Roberts Other Clinician: Referring Provider: Treating Provider/Extender: [name] in Treatment: 14 Procedure Performed for: Wound #1 Left,Plantar Foot Performed By: Physician Tyrone Shan, DO Post Procedure Diagnosis Same as Pre-procedure Notes silver nitrate Electronic Signature(s) Signed: 04/05/2022 1:36:02 PM By: Tyrone Shan DO Signed: 04/09/2022 4:10:29 PM By: Rhae Hammock RN Entered By: Rhae Hammock on 04/05/2022 11:57:14 -------------------------------------------------------------------------------- Physical Exam Details Patient Name: Date of Service: [name], [name]. 04/05/2022 11:15 A M Medical Record Number: [id number] Patient Account Number: [id number] Date of Birth/Sex: Treating RN: 12/31/1940 (81 y.o. M) Primary Care Provider: Dimas Roberts Other Clinician: Referring Provider: Treating Provider/Extender: [name] in Treatment: 14 Constitutional respirations regular, non-labored and within target range for patient.. Cardiovascular 2+ dorsalis pedis/posterior tibialis pulses. Psychiatric pleasant and cooperative. Notes Left foot: T the plantar aspect there is an open wound with hypergranulated tissue throughout. No signs of surrounding infection. o Electronic Signature(s) Signed: 04/05/2022 1:36:02 PM By: Tyrone Shan DO Entered By: [name] on 04/05/2022 13:19:51 [name] ([id number]) [id number] Page 3 of 8 -------------------------------------------------------------------------------- Physician Orders Details Patient Name: Date of Service: [name] 04/05/2022 11:15 A M Medical Record Number: [id number] Patient Account Number: [id number] Date of  Birth/Sex: Treating RN: 1940/11/06 (81 y.o. [name] Primary Care Provider: Dimas Roberts Other Clinician: Referring Provider: Treating Provider/Extender: [name] in Treatment: [phone] Verbal / Phone Orders: No Diagnosis Coding Follow-up Appointments Return appointment in 1 month. - Dr. Heber El[place] Other: - [facility] sells medihoney you can purchase. Anesthetic (In clinic) Topical Lidocaine 5% applied to wound bed Cellular or Tissue Based Products Cellular or Tissue Based Product Type: - Grafix PL Prime 2x3 #1, #2 02/22/22 Patient refused Grafix now. Bathing/ Shower/ Hygiene May shower and wash wound with soap and water. Edema Control - Lymphedema / SCD / Other Elevate legs to the level of the heart or above for 30 minutes daily and/or when sitting, a frequency of: - several times a day Avoid standing for long periods of time. Patient to wear own compression stockings every day. Additional Orders / Instructions Follow Nutritious Diet - Monitor/Control Blood Sugars Wound Treatment Wound #1 - Foot Wound Laterality: Plantar, Left Cleanser: Soap and Water Every Other Day/30 Days Discharge Instructions: May shower and wash wound with dial antibacterial soap and water prior to dressing change. Prim Dressing: KerraCel Ag Gelling Fiber Dressing, 4x5 in (silver alginate) (DME) (Generic) Every Other Day/30 Days ary Discharge Instructions: Apply silver alginate to wound bed as instructed Prim Dressing: MediHoney Gel, tube 1.5 (oz) Every Other Day/30 Days ary Discharge Instructions: Apply to wound bed as instructed Secondary Dressing: ADAPTIC TOUCH 3x4.25 in Every Other Day/30 Days Discharge Instructions: Apply over primary dressing as directed. Secondary Dressing: Woven Gauze Sponge, Non-Sterile 4x4 in Every Other Day/30 Days Discharge Instructions: Apply over primary dressing as directed. Secured With: Elastic Bandage 4 inch (ACE bandage) Every  Other Day/30 Days Discharge Instructions: Secure with ACE bandage as directed. Secured With: [employer], 4.5x3.1 (in/yd) Every Other Day/30 Days Discharge Instructions: Secure with Kerlix as directed. Electronic Signature(s) Signed: 04/05/2022 1:36:02 PM By: Tyrone Shan DO Entered By: [name] on 04/05/2022 13:19:58 Scheibel, Archer Roberts ([id number]) [id number] Page 4 of 8 -------------------------------------------------------------------------------- Problem List Details Patient Name: Date of Service: [name], [place]  NIEL Roberts. 04/05/2022 11:15 A M Medical Record Number: [id number] Patient Account Number: [id number] Date of Birth/Sex: Treating RN: [birth year] (81 y.o. M) Primary Care Provider: Dimas Roberts Other Clinician: Referring Provider: Treating Provider/Extender: [name] in Treatment: 14 Active Problems ICD-10 Encounter Code Description Active Date MDM Diagnosis E11.621 Type 2 diabetes mellitus with foot ulcer 12/26/2021 No Yes L97.522 Non-pressure chronic ulcer of other part of left foot with fat layer exposed 12/26/2021 No Yes E11.42 Type 2 diabetes mellitus with diabetic polyneuropathy 12/26/2021 No Yes W73.71 Chronic systolic (congestive) heart failure 12/26/2021 No Yes I48.0 Paroxysmal atrial fibrillation 12/26/2021 No Yes Z89.511 Acquired absence of right leg below knee 12/26/2021 No Yes Inactive Problems Resolved Problems Electronic Signature(s) Signed: 04/05/2022 1:36:02 PM By: Tyrone Shan DO Entered By: [name] on 04/05/2022 13:18:30 -------------------------------------------------------------------------------- Progress Note Details Patient Name: Date of Service: [name], [name]. 04/05/2022 11:15 A M Medical Record Number: [id number] Patient Account Number: [id number] Date of Birth/Sex: Treating RN: 30-Jan-1941 (81 y.o. M) Primary Care Provider: Dimas Roberts Other Clinician: Referring  Provider: Treating Provider/Extender: [name] in Treatment: 14 Subjective Chief Complaint Information obtained from Patient 12/26/2021; left foot plantar foot wound History of Present Illness (HPI) 12/26/2021 [name] is an 82 year old male with a past medical history of controlled type 2 diabetes, A-fib on Eliquis, chronic systolic heart failure on Entresto and right below knee amputation (2017) that presents to the clinic for a left plantar foot wound. He states this has been present for 1 year and is not sure how it HERMILO, DUTTER ([id number]) [id number] Page 5 of 8 started. He has peripheral neuropathy. He follows with Dr. Sherryle Lis, podiatry for this issue. He had an MRI on 10/16/2021 that did not show evidence of osteomyelitis. He has tried collagen with some benefit. Currently patient is keeping the area covered with a bandaid and no dressing. Due to his right BKA he has issues with balance and uses a cane. He is not a good candidate for the total contact cast. Surgical exostectomy has been thought of for offloading however risk and recovery is high. Patient currently denies signs of infection. 8/15; patient presents for follow-up. He did not pick up her Granix from the pharmacy. He has been keeping the area covered. We discussed potentially doing a skin substitute and he was interested in this. He denies signs of infection. 8/29; patient presents for follow-up. He has been using Medihoney to the wound bed. He is not able to offload this area due to having a prosthesis. We have not heard back for insurance approval of a skin substitute. 9/12; patient presents for follow-up. We have been using Medihoney to the wound bed. He has no issues or complaints today. He has been approved for Grafix. We discussed using this at next clinic visit and he was agreeable. 9/21; patient presents for follow-up. He has been using Medihoney to the  wound bed. Grafix is available and patient is agreeable to having this placed today. He knows to not get this wet and to return in 1 week. 9/28; this is a patient who has a wound on his left plantar foot for about 2-1/2 to 3 months. Previous BKA on the right which complicates our ability to offload this. We have been using Grafix and we reapplied this today postdebridement 10/5; patient presents for follow-up. He declines having the skin substitute placed again today. He wants to go back to Medihoney. He states he wants to  be able to see the wound bed daily. 11/9; patient presents for follow-up. He has been using Medihoney daily to the wound bed. Patient History Information obtained from Patient, Chart. Family History Heart Disease - Siblings, Hypertension - Siblings, Stroke - Father, No family history of Cancer, Diabetes, Hereditary Spherocytosis, Kidney Disease, Lung Disease, Seizures, Thyroid Problems, Tuberculosis. Social History Former smoker - Quit in 1983, Marital Status - Widowed, Alcohol Use - Rarely, Drug Use - No History, Caffeine Use - Rarely. Medical History Cardiovascular Patient has history of Arrhythmia, Congestive Heart Failure, Coronary Artery Disease Endocrine Patient has history of Type II Diabetes Musculoskeletal Patient has history of Osteoarthritis Neurologic Patient has history of Neuropathy Medical A Surgical History Notes nd Genitourinary Hx BPH Oncologic Basal Cell Carcinoma (Face) Objective Constitutional respirations regular, non-labored and within target range for patient.. Vitals Time Taken: 11:06 AM, Height: 72 in, Weight: 198 lbs, BMI: 26.9, Temperature: 97.7 F, Pulse: 73 bpm, Respiratory Rate: 18 breaths/min, Blood Pressure: 105/56 mmHg, Capillary Blood Glucose: 103 mg/dl. Cardiovascular 2+ dorsalis pedis/posterior tibialis pulses. Psychiatric pleasant and cooperative. General Notes: Left foot: T the plantar aspect there is an open wound with  hypergranulated tissue throughout. No signs of surrounding infection. o Integumentary (Hair, Skin) Wound #1 status is Open. Original cause of wound was Gradually Appeared. The date acquired was: 01/24/2021. The wound has been in treatment 14 Roberts. The wound is located on the [place]. The wound measures 1.3cm length x 1.5cm width x 0.2cm depth; 1.532cm^2 area and 0.306cm^3 volume. There is Fat Layer (Subcutaneous Tissue) exposed. There is no tunneling noted, however, there is undermining starting at 1:00 and ending at 6:00 with a maximum distance of 0.6cm. There is a medium amount of serosanguineous drainage noted. The wound margin is well defined and not attached to the wound base. There is large (67-100%) red, hyper - granulation within the wound bed. There is no necrotic tissue within the wound bed. The periwound skin appearance had no abnormalities noted for moisture. The periwound skin appearance had no abnormalities noted for color. The periwound skin appearance exhibited: Callus. The periwound skin appearance did not exhibit: Crepitus, Excoriation, Induration, Rash, Scarring. Periwound temperature was noted as No Abnormality. [name], [name] ([id number]) [id number] Page 6 of 8 Assessment Active Problems ICD-10 Type 2 diabetes mellitus with foot ulcer Non-pressure chronic ulcer of other part of left foot with fat layer exposed Type 2 diabetes mellitus with diabetic polyneuropathy Chronic systolic (congestive) heart failure Paroxysmal atrial fibrillation Acquired absence of right leg below knee Patient's wound is stable. It is difficult for him to offload this area Due to a prosthesis to the right leg.. We have been using offloading felt pad and have recommended offloading inserts. He declines skin substitute despite no cost. At this time I recommended adding silver alginate to Medihoney to see if this will help. I used silver nitrate to the hyper  granulated areas. He seems perfectly content with his wound healing And states that he is fine living with this wound. He would like to follow-up in a month. Procedures Wound #1 Pre-procedure diagnosis of Wound #1 is a Diabetic Wound/Ulcer of the Lower Extremity located on the Left,Plantar Foot . An Chemical Cauterization procedure was performed by Tyrone Shan, DO. Post procedure Diagnosis Wound #1: Same as Pre-Procedure Notes: silver nitrate Plan Follow-up Appointments: Return appointment in 1 month. - Dr. Heber [place] Other: - [facility] sells medihoney you can purchase. Anesthetic: (In clinic) Topical Lidocaine 5% applied to wound bed Cellular or  Tissue Based Products: Cellular or Tissue Based Product Type: - Grafix PL Prime 2x3 #1, #2 02/22/22 Patient refused Grafix now. Bathing/ Shower/ Hygiene: May shower and wash wound with soap and water. Edema Control - Lymphedema / SCD / Other: Elevate legs to the level of the heart or above for 30 minutes daily and/or when sitting, a frequency of: - several times a day Avoid standing for long periods of time. Patient to wear own compression stockings every day. Additional Orders / Instructions: Follow Nutritious Diet - Monitor/Control Blood Sugars WOUND #1: - Foot Wound Laterality: Plantar, Left Cleanser: Soap and Water Every Other Day/30 Days Discharge Instructions: May shower and wash wound with dial antibacterial soap and water prior to dressing change. Prim Dressing: KerraCel Ag Gelling Fiber Dressing, 4x5 in (silver alginate) (DME) (Generic) Every Other Day/30 Days ary Discharge Instructions: Apply silver alginate to wound bed as instructed Prim Dressing: MediHoney Gel, tube 1.5 (oz) Every Other Day/30 Days ary Discharge Instructions: Apply to wound bed as instructed Secondary Dressing: ADAPTIC TOUCH 3x4.25 in Every Other Day/30 Days Discharge Instructions: Apply over primary dressing as directed. Secondary Dressing: Woven  Gauze Sponge, Non-Sterile 4x4 in Every Other Day/30 Days Discharge Instructions: Apply over primary dressing as directed. Secured With: Elastic Bandage 4 inch (ACE bandage) Every Other Day/30 Days Discharge Instructions: Secure with ACE bandage as directed. Secured With: [employer], 4.5x3.1 (in/yd) Every Other Day/30 Days Discharge Instructions: Secure with Kerlix as directed. 1. Silver nitrate 2. Silver alginate and Medihoney 3. Aggressive offloadingoooffloading foam donut 4. Follow-up in 1 month Electronic Signature(s) Signed: 04/05/2022 1:36:02 PM By: Tyrone Shan DO Entered By: [name] on 04/05/2022 13:31:29 [name], [name] ([id number]) [id number] Page 7 of 8 -------------------------------------------------------------------------------- HxROS Details Patient Name: Date of Service: [name]. 04/05/2022 11:15 A M Medical Record Number: [id number] Patient Account Number: [id number] Date of Birth/Sex: Treating RN: 1940/11/10 (81 y.o. M) Primary Care Provider: Dimas Roberts Other Clinician: Referring Provider: Treating Provider/Extender: [name] in Treatment: 14 Information Obtained From Patient Chart Cardiovascular Medical History: Positive for: Arrhythmia; Congestive Heart Failure; Coronary Artery Disease Endocrine Medical History: Positive for: Type II Diabetes Treated with: Oral agents Genitourinary Medical History: Past Medical History Notes: Hx BPH Musculoskeletal Medical History: Positive for: Osteoarthritis Neurologic Medical History: Positive for: Neuropathy Oncologic Medical History: Past Medical History Notes: Basal Cell Carcinoma (Face) Immunizations Pneumococcal Vaccine: Received Pneumococcal Vaccination: Yes Received Pneumococcal Vaccination On or After 60th Birthday: Yes Implantable Devices None Family and Social History Cancer: No; Diabetes: No; Heart Disease:  Yes - Siblings; Hereditary Spherocytosis: No; Hypertension: Yes - Siblings; Kidney Disease: No; Lung Disease: No; Seizures: No; Stroke: Yes - Father; Thyroid Problems: No; Tuberculosis: No; Former smoker - Quit in 1983; Marital Status - Widowed; Alcohol Use: Rarely; Drug Use: No History; Caffeine Use: Rarely; Financial Concerns: No; Food, Clothing or Shelter Needs: No; Support System Lacking: No; Transportation Concerns: No Electronic Signature(s) Signed: 04/05/2022 1:36:02 PM By: Tyrone Shan DO Entered By: [name] on 04/05/2022 13:19:25 [name], [name] ([id number]) [id number] Page 8 of 8 -------------------------------------------------------------------------------- SuperBill Details Patient Name: Date of Service: [name] 04/05/2022 Medical Record Number: [id number] Patient Account Number: [id number] Date of Birth/Sex: Treating RN: [birth year] (81 y.o. [name] Primary Care Provider: Dimas Roberts Other Clinician: Referring Provider: Treating Provider/Extender: [name] in Treatment: 14 Diagnosis Coding ICD-10 Codes Code Description E11.621 Type 2 diabetes mellitus with foot ulcer L97.522 Non-pressure chronic ulcer of  other part of left foot with fat layer exposed E11.42 Type 2 diabetes mellitus with diabetic polyneuropathy F20.72 Chronic systolic (congestive) heart failure I48.0 Paroxysmal atrial fibrillation Z89.511 Acquired absence of right leg below knee Facility Procedures : CPT4 Code: [id number] Description: 44514 - CHEM CAUT GRANULATION TISS ICD-10 Diagnosis Description L97.522 Non-pressure chronic ulcer of other part of left foot with fat layer exposed Modifier: Quantity: 1 Physician Procedures : CPT4 Code Description Modifier [id number] - WC PHYS CHEM CAUT GRAN TISSUE ICD-10 Diagnosis Description L97.522 Non-pressure chronic ulcer of other part of left foot with fat layer  exposed Quantity: 1 Electronic Signature(s) Signed: 04/05/2022 1:36:02 PM By: Tyrone Shan DO Entered By: [name] on 04/05/2022 13:31:38

## 2022-04-27 ENCOUNTER — Telehealth: Payer: Self-pay | Admitting: Family Medicine

## 2022-04-27 NOTE — Telephone Encounter (Signed)
Left message to return call to our office at their convenience.  

## 2022-04-27 NOTE — Telephone Encounter (Signed)
Pt would like to speak with cma. Has a question regarding his Viagra rx.

## 2022-05-03 ENCOUNTER — Encounter (HOSPITAL_BASED_OUTPATIENT_CLINIC_OR_DEPARTMENT_OTHER): Payer: Medicare Other | Attending: Internal Medicine | Admitting: Internal Medicine

## 2022-05-03 ENCOUNTER — Other Ambulatory Visit: Payer: Self-pay | Admitting: Family Medicine

## 2022-05-03 DIAGNOSIS — Z85828 Personal history of other malignant neoplasm of skin: Secondary | ICD-10-CM | POA: Insufficient documentation

## 2022-05-03 DIAGNOSIS — M1990 Unspecified osteoarthritis, unspecified site: Secondary | ICD-10-CM | POA: Insufficient documentation

## 2022-05-03 DIAGNOSIS — I5022 Chronic systolic (congestive) heart failure: Secondary | ICD-10-CM | POA: Insufficient documentation

## 2022-05-03 DIAGNOSIS — Z79899 Other long term (current) drug therapy: Secondary | ICD-10-CM | POA: Diagnosis not present

## 2022-05-03 DIAGNOSIS — Z7901 Long term (current) use of anticoagulants: Secondary | ICD-10-CM | POA: Insufficient documentation

## 2022-05-03 DIAGNOSIS — E11621 Type 2 diabetes mellitus with foot ulcer: Secondary | ICD-10-CM | POA: Diagnosis not present

## 2022-05-03 DIAGNOSIS — E1142 Type 2 diabetes mellitus with diabetic polyneuropathy: Secondary | ICD-10-CM | POA: Diagnosis not present

## 2022-05-03 DIAGNOSIS — Z89511 Acquired absence of right leg below knee: Secondary | ICD-10-CM | POA: Diagnosis not present

## 2022-05-03 DIAGNOSIS — I2510 Atherosclerotic heart disease of native coronary artery without angina pectoris: Secondary | ICD-10-CM | POA: Insufficient documentation

## 2022-05-03 DIAGNOSIS — I480 Paroxysmal atrial fibrillation: Secondary | ICD-10-CM | POA: Diagnosis not present

## 2022-05-03 DIAGNOSIS — L97522 Non-pressure chronic ulcer of other part of left foot with fat layer exposed: Secondary | ICD-10-CM | POA: Insufficient documentation

## 2022-05-17 ENCOUNTER — Encounter (HOSPITAL_BASED_OUTPATIENT_CLINIC_OR_DEPARTMENT_OTHER): Payer: Medicare Other | Admitting: Internal Medicine

## 2022-05-17 DIAGNOSIS — Z89511 Acquired absence of right leg below knee: Secondary | ICD-10-CM | POA: Diagnosis not present

## 2022-05-17 DIAGNOSIS — E1142 Type 2 diabetes mellitus with diabetic polyneuropathy: Secondary | ICD-10-CM | POA: Diagnosis not present

## 2022-05-17 DIAGNOSIS — E11621 Type 2 diabetes mellitus with foot ulcer: Secondary | ICD-10-CM

## 2022-05-17 DIAGNOSIS — Z7901 Long term (current) use of anticoagulants: Secondary | ICD-10-CM | POA: Diagnosis not present

## 2022-05-17 DIAGNOSIS — L97522 Non-pressure chronic ulcer of other part of left foot with fat layer exposed: Secondary | ICD-10-CM | POA: Diagnosis not present

## 2022-05-17 DIAGNOSIS — I480 Paroxysmal atrial fibrillation: Secondary | ICD-10-CM | POA: Diagnosis not present

## 2022-05-25 ENCOUNTER — Other Ambulatory Visit: Payer: Self-pay | Admitting: Family Medicine

## 2022-05-27 ENCOUNTER — Other Ambulatory Visit: Payer: Self-pay | Admitting: Family Medicine

## 2022-05-31 ENCOUNTER — Telehealth: Payer: Self-pay | Admitting: Family Medicine

## 2022-05-31 NOTE — Progress Notes (Signed)
SALVATOR, SEPPALA ([id number]) [id number] Page 1 of 8 Visit Report for 05/17/2022 Chief Complaint Document Details Patient Name: Date of Service: [name] 05/17/2022 10:15 A M Medical Record Number: [id number] Patient Account Number: [id number] Date of Birth/Sex: Treating RN: 10-31-1940 (82 y.o. M) Primary Care Provider: Dimas Chyle Other Clinician: Referring Provider: Treating Provider/Extender: Laurena Slimmer Weeks in Treatment: 20 Information Obtained from: Patient Chief Complaint 12/26/2021; left foot plantar foot wound Electronic Signature(s) Signed: 05/17/2022 2:00:17 PM By: Kalman Shan DO Entered By: Kalman Shan on 05/17/2022 11:41:36 -------------------------------------------------------------------------------- HPI Details Patient Name: Date of Service: [name], Tyrone [name] 05/17/2022 10:15 A M Medical Record Number: [id number] Patient Account Number: [id number] Date of Birth/Sex: Treating RN: [birth year] (82 y.o. M) Primary Care Provider: Dimas Chyle Other Clinician: Referring Provider: Treating Provider/Extender: Jessee Avers in Treatment: 20 History of Present Illness HPI Description: 12/26/2021 [name] is an 82 year old male with a past medical history of controlled type 2 diabetes, A-fib on Eliquis, chronic systolic heart failure on Entresto and right below knee amputation (2017) that presents to the clinic for a left plantar foot wound. He states this has been present for 1 year and is not sure how it started. He has peripheral neuropathy. He follows with Dr. Sherryle Lis, podiatry for this issue. He had an MRI on 10/16/2021 that did not show evidence of osteomyelitis. He has tried collagen with some benefit. Currently patient is keeping the area covered with a bandaid and no dressing. Due to his right BKA he has issues with balance and uses a cane. He is not a good candidate for the  total contact cast. Surgical exostectomy has been thought of for offloading however risk and recovery is high. Patient currently denies signs of infection. 8/15; patient presents for follow-up. He did not pick up her Granix from the pharmacy. He has been keeping the area covered. We discussed potentially doing a skin substitute and he was interested in this. He denies signs of infection. 8/29; patient presents for follow-up. He has been using Medihoney to the wound bed. He is not able to offload this area due to having a prosthesis. We have not heard back for insurance approval of a skin substitute. 9/12; patient presents for follow-up. We have been using Medihoney to the wound bed. He has no issues or complaints today. He has been approved for Grafix. We discussed using this at next clinic visit and he was agreeable. 9/21; patient presents for follow-up. He has been using Medihoney to the wound bed. Grafix is available and patient is agreeable to having this placed today. He knows to not get this wet and to return in 1 week. 9/28; this is a patient who has a wound on his left plantar foot for about 2-1/2 to 3 months. Previous BKA on the right which complicates our ability to offload this. We have been using Grafix and we reapplied this today postdebridement 10/5; patient presents for follow-up. He declines having the skin substitute placed again today. He wants to go back to Medihoney. He states he wants to be able to see the wound bed daily. 11/9; patient presents for follow-up. He has been using Medihoney daily to the wound bed. 12/7; patient presents for follow-up. Has been using Medihoney to the wound bed. He has not been using silver alginate. He denies signs of infection. He is still not interested in doing a skin substitute. [name], [name] ([id number]) [id number] Page 2  of 8 12/21; patient presents for follow-up. He has been using Medihoney to the wound bed. He  does not want to use a peg assist insert to help with offloading. He is still not interested in doing a skin substitute. Electronic Signature(s) Signed: 05/17/2022 2:00:17 PM By: Kalman Shan DO Entered By: Kalman Shan on 05/17/2022 11:42:39 -------------------------------------------------------------------------------- Chemical Cauterization Details Patient Name: Date of Service: [name], Tyrone [name] 05/17/2022 10:15 A M Medical Record Number: [id number] Patient Account Number: [id number] Date of Birth/Sex: Treating RN: [birth year] (82 y.o. [name], Lauren Primary Care Provider: Dimas Chyle Other Clinician: Referring Provider: Treating Provider/Extender: Laurena Slimmer Weeks in Treatment: 20 Procedure Performed for: Wound #1 Left,Plantar Foot Performed By: Physician Kalman Shan, DO Post Procedure Diagnosis Same as Pre-procedure Electronic Signature(s) Signed: 05/17/2022 2:00:17 PM By: Kalman Shan DO Signed: 05/30/2022 5:35:40 PM By: Rhae Hammock RN Entered By: Rhae Hammock on 05/17/2022 10:49:13 -------------------------------------------------------------------------------- Physical Exam Details Patient Name: Date of Service: [name], Tyrone [name] 05/17/2022 10:15 A M Medical Record Number: [id number] Patient Account Number: [id number] Date of Birth/Sex: Treating RN: 15-Dec-1940 (82 y.o. M) Primary Care Provider: Dimas Chyle Other Clinician: Referring Provider: Treating Provider/Extender: Laurena Slimmer Weeks in Treatment: 20 Constitutional respirations regular, non-labored and within target range for patient.. Cardiovascular 2+ dorsalis pedis/posterior tibialis pulses. Psychiatric pleasant and cooperative. Notes Left foot: T the plantar aspect there is an open wound with hyper granulated tissue. No signs of surrounding infection. o Electronic Signature(s) Signed: 05/17/2022 2:00:17 PM By: Kalman Shan  DO Entered By: Kalman Shan on 05/17/2022 11:43:19 [name], [name] ([id number]) [id number] Page 3 of 8 -------------------------------------------------------------------------------- Physician Orders Details Patient Name: Date of Service: [name] 05/17/2022 10:15 A M Medical Record Number: [id number] Patient Account Number: [id number] Date of Birth/Sex: Treating RN: [birth year] (82 y.o. [name] Primary Care Provider: Dimas Chyle Other Clinician: Referring Provider: Treating Provider/Extender: Laurena Slimmer Weeks in Treatment: 20 Verbal / Phone Orders: No Diagnosis Coding Follow-up Appointments ppointment in 2 weeks. - w/ Dr. Heber [place] (any day) Return A Other: - [employer] sells medihoney you can purchase. Anesthetic (In clinic) Topical Lidocaine 5% applied to wound bed Cellular or Tissue Based Products Cellular or Tissue Based Product Type: - Grafix PL Prime 2x3 #1, #2 02/22/22 Patient refused Grafix now. Bathing/ Shower/ Hygiene May shower and wash wound with soap and water. Edema Control - Lymphedema / SCD / Other Elevate legs to the level of the heart or above for 30 minutes daily and/or when sitting, a frequency of: - several times a day Avoid standing for long periods of time. Patient to wear own compression stockings every day. Off-Loading Other: - Use a peg assist insert from clinic (we will cut a hole out for offloading of your wound) Additional Orders / Instructions Follow Nutritious Diet - Monitor/Control Blood Sugars Wound Treatment Wound #1 - Foot Wound Laterality: Plantar, Left Cleanser: Soap and Water Every Other Day/30 Days Discharge Instructions: May shower and wash wound with dial antibacterial soap and water prior to dressing change. Prim Dressing: MediHoney Gel, tube 1.5 (oz) Every Other Day/30 Days ary Discharge Instructions: Apply to wound bed as instructed Secondary Dressing:  ADAPTIC TOUCH 3x4.25 in Every Other Day/30 Days Discharge Instructions: Apply over primary dressing as directed. Secondary Dressing: Woven Gauze Sponge, Non-Sterile 4x4 in Every Other Day/30 Days Discharge Instructions: Apply over primary dressing as directed. Secured With: Elastic Bandage 4 inch (ACE bandage) Every Other  Day/30 Days Discharge Instructions: Secure with ACE bandage as directed. Secured With: [employer], 4.5x3.1 (in/yd) Every Other Day/30 Days Discharge Instructions: Secure with Kerlix as directed. Electronic Signature(s) Signed: 05/17/2022 2:00:17 PM By: Kalman Shan DO Entered By: Kalman Shan on 05/17/2022 11:43:27 [name], [name] ([id number]) [id number] Page 4 of 8 -------------------------------------------------------------------------------- Problem List Details Patient Name: Date of Service: [name] 05/17/2022 10:15 A M Medical Record Number: [id number] Patient Account Number: [id number] Date of Birth/Sex: Treating RN: 1940/11/04 (82 y.o. M) Primary Care Provider: Dimas Chyle Other Clinician: Referring Provider: Treating Provider/Extender: Laurena Slimmer Weeks in Treatment: 20 Active Problems ICD-10 Encounter Code Description Active Date MDM Diagnosis E11.621 Type 2 diabetes mellitus with foot ulcer 12/26/2021 No Yes L97.522 Non-pressure chronic ulcer of other part of left foot with fat layer exposed 12/26/2021 No Yes E11.42 Type 2 diabetes mellitus with diabetic polyneuropathy 12/26/2021 No Yes K93.26 Chronic systolic (congestive) heart failure 12/26/2021 No Yes I48.0 Paroxysmal atrial fibrillation 12/26/2021 No Yes Z89.511 Acquired absence of right leg below knee 12/26/2021 No Yes Inactive Problems Resolved Problems Electronic Signature(s) Signed: 05/17/2022 2:00:17 PM By: Kalman Shan DO Entered By: Kalman Shan on 05/17/2022  11:41:20 -------------------------------------------------------------------------------- Progress Note Details Patient Name: Date of Service: [name], Tyrone [name] 05/17/2022 10:15 A M Medical Record Number: [id number] Patient Account Number: [id number] Date of Birth/Sex: Treating RN: 02/12/1941 (82 y.o. M) Primary Care Provider: Dimas Chyle Other Clinician: Referring Provider: Treating Provider/Extender: Jessee Avers in Treatment: 20 Subjective Chief Complaint Information obtained from Patient 12/26/2021; left foot plantar foot wound History of Present Illness (HPI) 12/26/2021 [name] is an 82 year old male with a past medical history of controlled type 2 diabetes, A-fib on Eliquis, chronic systolic heart failure on Entresto and right below knee amputation (2017) that presents to the clinic for a left plantar foot wound. He states this has been present for 1 year and is not sure how it [name], [name] ([id number]) [phone].pdf Page 5 of 8 started. He has peripheral neuropathy. He follows with Dr. Sherryle Lis, podiatry for this issue. He had an MRI on 10/16/2021 that did not show evidence of osteomyelitis. He has tried collagen with some benefit. Currently patient is keeping the area covered with a bandaid and no dressing. Due to his right BKA he has issues with balance and uses a cane. He is not a good candidate for the total contact cast. Surgical exostectomy has been thought of for offloading however risk and recovery is high. Patient currently denies signs of infection. 8/15; patient presents for follow-up. He did not pick up her Granix from the pharmacy. He has been keeping the area covered. We discussed potentially doing a skin substitute and he was interested in this. He denies signs of infection. 8/29; patient presents for follow-up. He has been using Medihoney to the wound bed. He is not able to offload this area due to having a  prosthesis. We have not heard back for insurance approval of a skin substitute. 9/12; patient presents for follow-up. We have been using Medihoney to the wound bed. He has no issues or complaints today. He has been approved for Grafix. We discussed using this at next clinic visit and he was agreeable. 9/21; patient presents for follow-up. He has been using Medihoney to the wound bed. Grafix is available and patient is agreeable to having this placed today. He knows to not get this wet and to return in 1 week. 9/28; this  is a patient who has a wound on his left plantar foot for about 2-1/2 to 3 months. Previous BKA on the right which complicates our ability to offload this. We have been using Grafix and we reapplied this today postdebridement 10/5; patient presents for follow-up. He declines having the skin substitute placed again today. He wants to go back to Medihoney. He states he wants to be able to see the wound bed daily. 11/9; patient presents for follow-up. He has been using Medihoney daily to the wound bed. 12/7; patient presents for follow-up. Has been using Medihoney to the wound bed. He has not been using silver alginate. He denies signs of infection. He is still not interested in doing a skin substitute. 12/21; patient presents for follow-up. He has been using Medihoney to the wound bed. He does not want to use a peg assist insert to help with offloading. He is still not interested in doing a skin substitute. Patient History Information obtained from Patient, Chart. Family History Heart Disease - Siblings, Hypertension - Siblings, Stroke - Father, No family history of Cancer, Diabetes, Hereditary Spherocytosis, Kidney Disease, Lung Disease, Seizures, Thyroid Problems, Tuberculosis. Social History Former smoker - Quit in 1983, Marital Status - Widowed, Alcohol Use - Rarely, Drug Use - No History, Caffeine Use - Rarely. Medical History Cardiovascular Patient has history of  Arrhythmia, Congestive Heart Failure, Coronary Artery Disease Endocrine Patient has history of Type II Diabetes Musculoskeletal Patient has history of Osteoarthritis Neurologic Patient has history of Neuropathy Medical A Surgical History Notes nd Genitourinary Hx BPH Oncologic Basal Cell Carcinoma (Face) Objective Constitutional respirations regular, non-labored and within target range for patient.. Vitals Time Taken: 10:11 AM, Height: 72 in, Weight: 198 lbs, BMI: 26.9, Pulse: 83 bpm, Respiratory Rate: 18 breaths/min, Blood Pressure: 115/74 mmHg. Cardiovascular 2+ dorsalis pedis/posterior tibialis pulses. Psychiatric pleasant and cooperative. General Notes: Left foot: T the plantar aspect there is an open wound with hyper granulated tissue. No signs of surrounding infection. o Integumentary (Hair, Skin) Wound #1 status is Open. Original cause of wound was Gradually Appeared. The date acquired was: 01/24/2021. The wound has been in treatment 20 weeks. The wound is located on the [place]. The wound measures 1.5cm length x 1.6cm width x 0.3cm depth; 1.885cm^2 area and 0.565cm^3 volume. There is Fat Layer (Subcutaneous Tissue) exposed. There is no tunneling or undermining noted. There is a medium amount of serosanguineous drainage noted. The wound margin is well defined and not attached to the wound base. There is large (67-100%) red, hyper - granulation within the wound bed. There is no necrotic tissue within the wound bed. The periwound skin appearance had no abnormalities noted for moisture. The periwound skin appearance had no abnormalities noted for color. The periwound skin appearance exhibited: Callus. The periwound skin appearance did not exhibit: Crepitus, Excoriation, Induration, Rash, [name], [name] ([id number]) [phone].pdf Page 6 of 8 Scarring. Periwound temperature was noted as No Abnormality. Assessment Active Problems ICD-10 Type 2  diabetes mellitus with foot ulcer Non-pressure chronic ulcer of other part of left foot with fat layer exposed Type 2 diabetes mellitus with diabetic polyneuropathy Chronic systolic (congestive) heart failure Paroxysmal atrial fibrillation Acquired absence of right leg below knee Patient's wound is stable. I used silver nitrate to the hyper granulated areas. I recommended continuing Medihoney. Unfortunately he has not candidate for the total contact cast due to issues with gait and prosthesis on the right leg. He declines any offloading device for the left foot including a peg  assist. He declines skin substitute despite it being covered at 100% and there is no out-of-pocket cost. Podiatry referred to our clinic and he may need to follow back up with them. Unfortunately he is declining all her modalities to help with healing. Will discuss this at next clinic visit. Procedures Wound #1 Pre-procedure diagnosis of Wound #1 is a Diabetic Wound/Ulcer of the Lower Extremity located on the Left,Plantar Foot . An Chemical Cauterization procedure was performed by Kalman Shan, DO. Post procedure Diagnosis Wound #1: Same as Pre-Procedure Plan Follow-up Appointments: Return Appointment in 2 weeks. - w/ Dr. Heber [place] (any day) Other: - [employer] sells medihoney you can purchase. Anesthetic: (In clinic) Topical Lidocaine 5% applied to wound bed Cellular or Tissue Based Products: Cellular or Tissue Based Product Type: - Grafix PL Prime 2x3 #1, #2 02/22/22 Patient refused Grafix now. Bathing/ Shower/ Hygiene: May shower and wash wound with soap and water. Edema Control - Lymphedema / SCD / Other: Elevate legs to the level of the heart or above for 30 minutes daily and/or when sitting, a frequency of: - several times a day Avoid standing for long periods of time. Patient to wear own compression stockings every day. Off-Loading: Other: - Use a peg assist insert from clinic (we will cut a hole  out for offloading of your wound) Additional Orders / Instructions: Follow Nutritious Diet - Monitor/Control Blood Sugars WOUND #1: - Foot Wound Laterality: Plantar, Left Cleanser: Soap and Water Every Other Day/30 Days Discharge Instructions: May shower and wash wound with dial antibacterial soap and water prior to dressing change. Prim Dressing: MediHoney Gel, tube 1.5 (oz) Every Other Day/30 Days ary Discharge Instructions: Apply to wound bed as instructed Secondary Dressing: ADAPTIC TOUCH 3x4.25 in Every Other Day/30 Days Discharge Instructions: Apply over primary dressing as directed. Secondary Dressing: Woven Gauze Sponge, Non-Sterile 4x4 in Every Other Day/30 Days Discharge Instructions: Apply over primary dressing as directed. Secured With: Elastic Bandage 4 inch (ACE bandage) Every Other Day/30 Days Discharge Instructions: Secure with ACE bandage as directed. Secured With: [employer], 4.5x3.1 (in/yd) Every Other Day/30 Days Discharge Instructions: Secure with Kerlix as directed. 1. Continue Medihoney 2. Offload the wound bed Electronic Signature(s) Signed: 05/17/2022 2:00:17 PM By: Kalman Shan DO Entered By: Kalman Shan on 05/17/2022 12:38:05 [name], [name] ([id number]) [id number] Page 7 of 8 -------------------------------------------------------------------------------- HxROS Details Patient Name: Date of Service: [name] 05/17/2022 10:15 A M Medical Record Number: [id number] Patient Account Number: [id number] Date of Birth/Sex: Treating RN: [birth year] (82 y.o. M) Primary Care Provider: Dimas Chyle Other Clinician: Referring Provider: Treating Provider/Extender: Jessee Avers in Treatment: 20 Information Obtained From Patient Chart Cardiovascular Medical History: Positive for: Arrhythmia; Congestive Heart Failure; Coronary Artery Disease Endocrine Medical History: Positive for: Type  II Diabetes Treated with: Oral agents Genitourinary Medical History: Past Medical History Notes: Hx BPH Musculoskeletal Medical History: Positive for: Osteoarthritis Neurologic Medical History: Positive for: Neuropathy Oncologic Medical History: Past Medical History Notes: Basal Cell Carcinoma (Face) Immunizations Pneumococcal Vaccine: Received Pneumococcal Vaccination: Yes Received Pneumococcal Vaccination On or After 60th Birthday: Yes Implantable Devices None Family and Social History Cancer: No; Diabetes: No; Heart Disease: Yes - Siblings; Hereditary Spherocytosis: No; Hypertension: Yes - Siblings; Kidney Disease: No; Lung Disease: No; Seizures: No; Stroke: Yes - Father; Thyroid Problems: No; Tuberculosis: No; Former smoker - Quit in 1983; Marital Status - Widowed; Alcohol Use: Rarely; Drug Use: No History; Caffeine Use: Rarely; Financial Concerns: No; Food, Clothing  or Shelter Needs: No; Support System Lacking: No; Transportation Concerns: No Electronic Signature(s) Signed: 05/17/2022 2:00:17 PM By: Kalman Shan DO Entered By: Kalman Shan on 05/17/2022 11:42:44 [name], [name] ([id number]) [id number] Page 8 of 8 -------------------------------------------------------------------------------- SuperBill Details Patient Name: Date of Service: [name] 05/17/2022 Medical Record Number: [id number] Patient Account Number: [id number] Date of Birth/Sex: Treating RN: 1941/03/25 (82 y.o. [name], Lauren Primary Care Provider: Dimas Chyle Other Clinician: Referring Provider: Treating Provider/Extender: Laurena Slimmer Weeks in Treatment: 20 Diagnosis Coding ICD-10 Codes Code Description E11.621 Type 2 diabetes mellitus with foot ulcer L97.522 Non-pressure chronic ulcer of other part of left foot with fat layer exposed E11.42 Type 2 diabetes mellitus with diabetic polyneuropathy M41.58 Chronic systolic  (congestive) heart failure I48.0 Paroxysmal atrial fibrillation Z89.511 Acquired absence of right leg below knee Facility Procedures : CPT4 Code: [id number] Description: 08811 - CHEM CAUT GRANULATION TISS ICD-10 Diagnosis Description L97.522 Non-pressure chronic ulcer of other part of left foot with fat layer exposed E11.621 Type 2 diabetes mellitus with foot ulcer Modifier: Quantity: 1 Physician Procedures : CPT4 Code Description Modifier [id number] - WC PHYS CHEM CAUT GRAN TISSUE ICD-10 Diagnosis Description L97.522 Non-pressure chronic ulcer of other part of left foot with fat layer exposed E11.621 Type 2 diabetes mellitus with foot ulcer Quantity: 1 Electronic Signature(s) Signed: 05/17/2022 2:00:17 PM By: Kalman Shan DO Entered By: Kalman Shan on 05/17/2022 12:38:17

## 2022-05-31 NOTE — Progress Notes (Signed)
Tyrone, BAILLY ([id number]) [id number] Page 1 of 7 Visit Report for 05/17/2022 Arrival Information Details Patient Name: Date of Service: [name] 05/17/2022 10:15 A M Medical Record Number: [id number] Patient Account Number: [id number] Date of Birth/Sex: Treating RN: 05-02-1941 (82 y.o. M) Primary Care Lakyn Alsteen: [name] Other Clinician: Referring Anders Hohmann: Treating Tane Biegler/Extender: Jessee Avers in Treatment: 63 Visit Information History Since Last Visit Added or deleted any medications: No Patient Arrived: [name] Any new allergies or adverse reactions: No Arrival Time: 10:09 Had a fall or experienced change in No Accompanied By: self activities of daily living that may affect Transfer Assistance: None risk of falls: Patient Identification Verified: Yes Signs or symptoms of abuse/neglect since last visito No Secondary Verification Process Completed: Yes Hospitalized since last visit: No Patient Requires Transmission-Based Precautions: No Implantable device outside of the clinic excluding No Patient Has Alerts: Yes cellular tissue based products placed in the center Patient Alerts: Patient on Blood Thinner since last visit: ABI: R=Non Comp Has Dressing in Place as Prescribed: Yes Pain Present Now: No Electronic Signature(s) Signed: 05/18/2022 8:46:25 AM By: Erenest Blank Entered By: Erenest Blank on 05/17/2022 10:11:32 -------------------------------------------------------------------------------- Encounter Discharge Information Details Patient Name: Date of Service: [name], DA Tyrone E. 05/17/2022 10:15 A M Medical Record Number: [id number] Patient Account Number: [id number] Date of Birth/Sex: Treating RN: 16-Oct-1940 (82 y.o. [name], Lauren Primary Care Rodman Recupero: [name] Other Clinician: Referring [name]: Treating [name]/Extender: Jessee Avers in Treatment:  20 Encounter Discharge Information Items Discharge Condition: Stable Ambulatory Status: Wheelchair Discharge Destination: Home Transportation: Private Auto Accompanied By: self Schedule Follow-up Appointment: Yes Clinical Summary of Care: Patient Declined Electronic Signature(s) Signed: 05/30/2022 5:35:40 PM By: Rhae Hammock RN Entered By: Rhae Hammock on 05/17/2022 11:00:17 OSEPH, IMBURGIA E ([id number]) [id number] Page 2 of 7 -------------------------------------------------------------------------------- Lower Extremity Assessment Details Patient Name: Date of Service: [name] 05/17/2022 10:15 A M Medical Record Number: [id number] Patient Account Number: [id number] Date of Birth/Sex: Treating RN: 02/04/1941 (82 y.o. M) Primary Care [name]: [name] Other Clinician: Referring Gabreil Roberts: Treating Subrina Vecchiarelli/Extender: Laurena Slimmer Weeks in Treatment: 20 Edema Assessment Assessed: [Left: No] [Right: No] Edema: [Left: Ye] [Right: s] Calf Left: Right: Point of Measurement: 31 cm From Medial Instep 35.8 cm Ankle Left: Right: Point of Measurement: 9 cm From Medial Instep 29.9 cm Electronic Signature(s) Signed: 05/18/2022 8:46:25 AM By: Erenest Blank Entered By: Erenest Blank on 05/17/2022 10:15:30 -------------------------------------------------------------------------------- Multi Wound Chart Details Patient Name: Date of Service: [name], DA Tyrone E. 05/17/2022 10:15 A M Medical Record Number: [id number] Patient Account Number: [id number] Date of Birth/Sex: Treating RN: 19-Jan-1941 (82 y.o. M) Primary Care [name]: [name] Other Clinician: Referring [name]: Treating Darwin Roberts/Extender: Laurena Slimmer Weeks in Treatment: 20 Vital Signs Height(in): 72 Pulse(bpm): 83 Weight(lbs): 198 Blood Pressure(mmHg): 115/74 Body Mass Index(BMI): 26.9 Temperature(F): Respiratory  Rate(breaths/min): 18 [1:Photos:] [N/A:N/A] Left, Plantar Foot N/A N/A Wound Location: Gradually Appeared N/A N/A Wounding Event: Diabetic Wound/Ulcer of the Lower N/A N/A Primary Etiology: Extremity Arrhythmia, Congestive Heart Failure, N/A N/A Comorbid History: Coronary Artery Disease, Type II MCGWIRE, DASARO ([id number]) [id number] Page 3 of 7 Diabetes, Osteoarthritis, Neuropathy 01/24/2021 N/A N/A Date Acquired: 20 N/A N/A Weeks of Treatment: Open N/A N/A Wound Status: No N/A N/A Wound Recurrence: 1.5x1.6x0.3 N/A N/A Measurements L x W x D (cm) 1.885 N/A N/A A (cm) : rea 0.565 N/A N/A Volume (cm) : -17.70% N/A N/A %  Reduction in A rea: -17.50% N/A N/A % Reduction in Volume: Grade 1 N/A N/A Classification: Medium N/A N/A Exudate A mount: Serosanguineous N/A N/A Exudate Type: red, brown N/A N/A Exudate Color: Well defined, not attached N/A N/A Wound Margin: Large (67-100%) N/A N/A Granulation A mount: Red, Hyper-granulation N/A N/A Granulation Quality: None Present (0%) N/A N/A Necrotic A mount: Fat Layer (Subcutaneous Tissue): Yes N/A N/A Exposed Structures: Fascia: No Tendon: No Muscle: No Joint: No Bone: No Small (1-33%) N/A N/A Epithelialization: Callus: Yes N/A N/A Periwound Skin Texture: Excoriation: No Induration: No Crepitus: No Rash: No Scarring: No Maceration: No N/A N/A Periwound Skin Moisture: Dry/Scaly: No Atrophie Blanche: No N/A N/A Periwound Skin Color: Cyanosis: No Ecchymosis: No Erythema: No Hemosiderin Staining: No Mottled: No Pallor: No Rubor: No No Abnormality N/A N/A Temperature: Chemical Cauterization N/A N/A Procedures Performed: Treatment Notes Wound #1 (Foot) Wound Laterality: Plantar, Left Cleanser Soap and Water Discharge Instruction: May shower and wash wound with dial antibacterial soap and water prior to dressing change. Peri-Wound Care Topical Primary Dressing MediHoney  Gel, tube 1.5 (oz) Discharge Instruction: Apply to wound bed as instructed Secondary Dressing ADAPTIC TOUCH 3x4.25 in Discharge Instruction: Apply over primary dressing as directed. Woven Gauze Sponge, Non-Sterile 4x4 in Discharge Instruction: Apply over primary dressing as directed. Secured With Elastic Bandage 4 inch (ACE bandage) Discharge Instruction: Secure with ACE bandage as directed. Kerlix Roll Sterile, 4.5x3.1 (in/yd) Discharge Instruction: Secure with Kerlix as directed. Compression Wrap Compression Stockings Add-Ons Electronic Signature(s) Signed: 05/17/2022 2:00:17 PM By: Jannet Askew ([id number]) [id number] Page 4 of 7 Signed: 05/17/2022 2:00:17 PM By: Kalman Shan DO Entered By: Kalman Shan on 05/17/2022 11:41:29 -------------------------------------------------------------------------------- Multi-Disciplinary Care Plan Details Patient Name: Date of Service: [name], DA Tyrone E. 05/17/2022 10:15 A M Medical Record Number: [id number] Patient Account Number: [id number] Date of Birth/Sex: Treating RN: 07-Jul-1940 (82 y.o. [name], Lauren Primary Care Dajanay Northrup: [name] Other Clinician: Referring Maili Shutters: Treating Deronda Christian/Extender: Jessee Avers in Treatment: 20 Active Inactive Nutrition Nursing Diagnoses: Impaired glucose control: actual or potential Goals: Patient/caregiver verbalizes understanding of need to maintain therapeutic glucose control per primary care physician Date Initiated: 03/01/2022 Target Resolution Date: 05/26/2022 Goal Status: Active Interventions: Assess HgA1c results as ordered upon admission and as needed Treatment Activities: Obtain HgA1c : 03/01/2022 Notes: Wound/Skin Impairment Nursing Diagnoses: Impaired tissue integrity Goals: Patient/caregiver will verbalize understanding of skin care regimen Date Initiated: 12/26/2021 Date Inactivated:  03/01/2022 Target Resolution Date: 03/23/2022 Unmet Reason: patient refuses Goal Status: Unmet offloading, advance tissue products, and following providers plan of care. Ulcer/skin breakdown will have a volume reduction of 30% by week 4 Date Initiated: 12/26/2021 Date Inactivated: 01/23/2022 Target Resolution Date: 01/23/2022 Goal Status: Met Ulcer/skin breakdown will have a volume reduction of 50% by week 8 Date Initiated: 01/23/2022 Target Resolution Date: 05/26/2022 Goal Status: Active Interventions: Assess patient/caregiver ability to obtain necessary supplies Assess patient/caregiver ability to perform ulcer/skin care regimen upon admission and as needed Assess ulceration(s) every visit Provide education on ulcer and skin care Treatment Activities: Topical wound management initiated : 12/26/2021 Notes: 01/23/22: Wound care regimen continues. Wound greater than 30% volume reduction, new goal initiated. Pt. not compliant to recommendations/ plan of care prescribed by Dr. Heber Eldon. Electronic Signature(s) Signed: 05/30/2022 5:35:40 PM By: Rhae Hammock RN Entered By: Rhae Hammock on 05/17/2022 10:47:26 Berman, [name] ([id number]) [id number] Page 5 of 7 -------------------------------------------------------------------------------- Pain Assessment Details Patient Name: Date of Service: WO O D, DA Tyrone  E. 05/17/2022 10:15 A M Medical Record Number: [id number] Patient Account Number: [id number] Date of Birth/Sex: Treating RN: [birth year] (82 y.o. M) Primary Care Astaria Nanez: [name] Other Clinician: Referring Lovene Maret: Treating Zaylah Blecha/Extender: Laurena Slimmer Weeks in Treatment: 20 Active Problems Location of Pain Severity and Description of Pain Patient Has Paino No Site Locations Pain Management and Medication Current Pain Management: Electronic Signature(s) Signed: 05/18/2022 8:46:25 AM By: Erenest Blank Entered By: Erenest Blank on 05/17/2022 10:11:57 -------------------------------------------------------------------------------- Patient/Caregiver Education Details Patient Name: Date of Service: [name], DA Tyrone E. 12/21/2023andnbsp10:15 A M Medical Record Number: [id number] Patient Account Number: [id number] Date of Birth/Gender: Treating RN: June 14, 1940 (82 y.o. [name] Primary Care Physician: [name] Other Clinician: Referring Physician: Treating Physician/Extender: Jessee Avers in Treatment: 20 Education Assessment Education Provided To: Patient Education Topics Provided Wound/Skin Impairment: Methods: Explain/Verbal Responses: Reinforcements needed, State content correctly CONNAR, KEATING ([id number]) [id number] Page 6 of 7 Electronic Signature(s) Signed: 05/30/2022 5:35:40 PM By: Rhae Hammock RN Entered By: Rhae Hammock on 05/17/2022 10:47:38 -------------------------------------------------------------------------------- Wound Assessment Details Patient Name: Date of Service: [name], DA Tyrone E. 05/17/2022 10:15 A M Medical Record Number: [id number] Patient Account Number: [id number] Date of Birth/Sex: Treating RN: [birth year] (83 y.o. M) Primary Care Nickole Adamek: [name] Other Clinician: Referring Mersadies Petree: Treating Callahan Wild/Extender: Laurena Slimmer Weeks in Treatment: 20 Wound Status Wound Number: 1 Primary Diabetic Wound/Ulcer of the Lower Extremity Etiology: Wound Location: Left, Plantar Foot Wound Open Wounding Event: Gradually Appeared Status: Date Acquired: 01/24/2021 Comorbid Arrhythmia, Congestive Heart Failure, Coronary Artery Disease, Weeks Of Treatment: 20 History: Type II Diabetes, Osteoarthritis, Neuropathy Clustered Wound: No Photos Wound Measurements Length: (cm) 1.5 Width: (cm) 1.6 Depth: (cm) 0.3 Area: (cm) 1.885 Volume: (cm) 0.565 % Reduction in Area: -17.7% %  Reduction in Volume: -17.5% Epithelialization: Small (1-33%) Tunneling: No Undermining: No Wound Description Classification: Grade 1 Wound Margin: Well defined, not attached Exudate Amount: Medium Exudate Type: Serosanguineous Exudate Color: red, brown Foul Odor After Cleansing: No Slough/Fibrino No Wound Bed Granulation Amount: Large (67-100%) Exposed Structure Granulation Quality: Red, Hyper-granulation Fascia Exposed: No Necrotic Amount: None Present (0%) Fat Layer (Subcutaneous Tissue) Exposed: Yes Tendon Exposed: No Muscle Exposed: No Joint Exposed: No Bone Exposed: No Periwound Skin Texture Texture Color No Abnormalities Noted: No No Abnormalities Noted: Yes Callus: Yes Temperature / Pain Crepitus: No Temperature: No Abnormality KRISTJAN, DERNER ([id number]) [id number] Page 7 of 7 Excoriation: No Induration: No Rash: No Scarring: No Moisture No Abnormalities Noted: Yes Treatment Notes Wound #1 (Foot) Wound Laterality: Plantar, Left Cleanser Soap and Water Discharge Instruction: May shower and wash wound with dial antibacterial soap and water prior to dressing change. Peri-Wound Care Topical Primary Dressing MediHoney Gel, tube 1.5 (oz) Discharge Instruction: Apply to wound bed as instructed Secondary Dressing ADAPTIC TOUCH 3x4.25 in Discharge Instruction: Apply over primary dressing as directed. Woven Gauze Sponge, Non-Sterile 4x4 in Discharge Instruction: Apply over primary dressing as directed. Secured With Elastic Bandage 4 inch (ACE bandage) Discharge Instruction: Secure with ACE bandage as directed. Kerlix Roll Sterile, 4.5x3.1 (in/yd) Discharge Instruction: Secure with Kerlix as directed. Compression Wrap Compression Stockings Add-Ons Electronic Signature(s) Signed: 05/18/2022 8:46:25 AM By: Erenest Blank Entered By: Erenest Blank on 05/17/2022  10:16:48 -------------------------------------------------------------------------------- Vitals Details Patient Name: Date of Service: [name], DA Tyrone E. 05/17/2022 10:15 A M Medical Record Number: [id number] Patient Account Number: [id number] Date of Birth/Sex: Treating RN: 16-Mar-1941 (82 y.o. M) Primary Care Mckinzey Entwistle: [name] Other  Clinician: Referring Margery Szostak: Treating Devun Anna/Extender: Jessee Avers in Treatment: 20 Vital Signs Time Taken: 10:11 Pulse (bpm): 83 Height (in): 72 Respiratory Rate (breaths/min): 18 Weight (lbs): 198 Blood Pressure (mmHg): 115/74 Body Mass Index (BMI): 26.9 Reference Range: 80 - 120 mg / dl Electronic Signature(s) Signed: 05/18/2022 8:46:25 AM By: Erenest Blank Entered By: Erenest Blank on 05/17/2022 10:11:50

## 2022-05-31 NOTE — Telephone Encounter (Signed)
Patient called wanting to speak to CMA re medications- has questions re intake-

## 2022-05-31 NOTE — Progress Notes (Signed)
ANANIAS, KOLANDER ([id number]) [phone].pdf Page 1 of 8 Visit Report for 05/03/2022 Arrival Information Details Patient Name: Date of Service: [name] 05/03/2022 10:00 A M Medical Record Number: [id number] Patient Account Number: [id number] Date of Birth/Sex: Treating RN: 03-Nov-1940 (82 y.o. [name] Primary Care [name]: [name] Other Clinician: Referring [name]: Treating [name]/Extender: [name] in Treatment: 18 Visit Information History Since Last Visit Added or deleted any medications: No Patient Arrived: Cane Any new allergies or adverse reactions: No Arrival Time: 10:27 Had a fall or experienced change in No Accompanied By: self activities of daily living that may affect Transfer Assistance: None risk of falls: Patient Identification Verified: Yes Signs or symptoms of abuse/neglect since last visito No Secondary Verification Process Completed: Yes Hospitalized since last visit: No Patient Requires Transmission-Based Precautions: No Implantable device outside of the clinic excluding No Patient Has Alerts: Yes cellular tissue based products placed in the center Patient Alerts: Patient on Blood Thinner since last visit: ABI: R=Non Comp Has Dressing in Place as Prescribed: Yes Pain Present Now: No Electronic Signature(s) Signed: 05/03/2022 5:22:15 PM By: Tyrone Pilling RN, BSN Entered By: [name] on 05/03/2022 10:28:01 -------------------------------------------------------------------------------- Complex / Palliative Patient Assessment Details Patient Name: Date of Service: [name], Tyrone [name]. 05/03/2022 10:00 A M Medical Record Number: [id number] Patient Account Number: [id number] Date of Birth/Sex: Treating RN: 21-Feb-1941 (82 y.o. [name] Primary Care [name]: [name] Other Clinician: Referring [name]: Treating [name] Weeks in  Treatment: 18 Complex Wound Management Criteria Patient is unable to adhere to an Advanced Wound Management Plan. Agreem to Receive Wound Care has been implemented. Patient demonstrates continued inability to adhere to an Advanced Wound Management Plan, ent the patient has been presented with the option of continuing the Advanced Plan or implementing the Complex Wound Management Plan. Patient or healthcare surrogate must agree to Complex Wound Management Plan. Palliative Wound Management Criteria Care Approach Wound Care Plan: Complex Wound Management Electronic Signature(s) Signed: 05/07/2022 4:58:54 PM By: Tyrone Pilling RN, BSN Signed: 05/08/2022 10:29:29 AM By: Kalman Shan DO Entered By: [name] on 05/07/2022 16:52:08 [name] ([id number]) [id number] Page 2 of 8 -------------------------------------------------------------------------------- Encounter Discharge Information Details Patient Name: Date of Service: [name] 05/03/2022 10:00 A M Medical Record Number: [id number] Patient Account Number: [id number] Date of Birth/Sex: Treating RN: 01-21-1941 (82 y.o. [name], [name] Primary Care Kollyns Roberts: [name] Other Clinician: Referring [name]: Treating [name]/Extender: [name] in Treatment: 18 Encounter Discharge Information Items Post Procedure Vitals Discharge Condition: Stable Temperature (F): 98.1 Ambulatory Status: Ambulatory Pulse (bpm): 74 Discharge Destination: Home Respiratory Rate (breaths/min): 20 Transportation: Private Auto Blood Pressure (mmHg): 134/74 Accompanied By: self Schedule Follow-up Appointment: Yes Clinical Summary of Care: Patient Declined Electronic Signature(s) Signed: 05/30/2022 5:35:40 PM By: Tyrone Hammock RN Entered By: [name] on 05/03/2022 10:49:46 -------------------------------------------------------------------------------- Lower  Extremity Assessment Details Patient Name: Date of Service: [name], Tyrone [name]. 05/03/2022 10:00 A M Medical Record Number: [id number] Patient Account Number: [id number] Date of Birth/Sex: Treating RN: March 11, 1941 (82 y.o. [name] Primary Care [name]: [name] Other Clinician: Referring [name]: Treating [name]/Extender: [name] Weeks in Treatment: 18 Edema Assessment Assessed: [Left: Yes] [Right: No] Edema: [Left: Ye] [Right: s] Calf Left: Right: Point of Measurement: 31 cm From Medial Instep 33 cm Ankle Left: Right: Point of Measurement: 9 cm From Medial Instep 29.5 cm  Vascular Assessment Pulses: Dorsalis Pedis Palpable: [Left:Yes] Electronic Signature(s) Signed: 05/03/2022 5:22:15 PM By: Tyrone Pilling RN, BSN Entered By: [name] on 05/03/2022 10:30:03 [name] ([id number]) [id number] Page 3 of 8 -------------------------------------------------------------------------------- Multi Wound Chart Details Patient Name: Date of Service: [name] 05/03/2022 10:00 A M Medical Record Number: [id number] Patient Account Number: [id number] Date of Birth/Sex: Treating RN: 16-Oct-1940 (82 y.o. M) Primary Care Daziah Hesler: [name] Other Clinician: Referring [name]: Treating [name]/Extender: [name] in Treatment: 18 Vital Signs Height(in): 72 Capillary Blood Glucose(mg/dl): 108 Weight(lbs): 198 Pulse(bpm): 71 Body Mass Index(BMI): 26.9 Blood Pressure(mmHg): 108/66 Temperature(F): 97.6 Respiratory Rate(breaths/min): 20 [1:Photos:] [N/A:N/A] Left, Plantar Foot N/A N/A Wound Location: Gradually Appeared N/A N/A Wounding Event: Diabetic Wound/Ulcer of the Lower N/A N/A Primary Etiology: Extremity Arrhythmia, Congestive Heart Failure, N/A N/A Comorbid History: Coronary Artery Disease, Type II Diabetes, Osteoarthritis, Neuropathy 01/24/2021 N/A N/A Date  Acquired: 18 N/A N/A Weeks of Treatment: Open N/A N/A Wound Status: No N/A N/A Wound Recurrence: 1.3x1.5x0.2 N/A N/A Measurements L x W x D (cm) 1.532 N/A N/A A (cm) : rea 0.306 N/A N/A Volume (cm) : 4.40% N/A N/A % Reduction in A rea: 36.40% N/A N/A % Reduction in Volume: Grade 1 N/A N/A Classification: Medium N/A N/A Exudate A mount: Serosanguineous N/A N/A Exudate Type: red, brown N/A N/A Exudate Color: Well defined, not attached N/A N/A Wound Margin: Large (67-100%) N/A N/A Granulation A mount: Red, Hyper-granulation N/A N/A Granulation Quality: None Present (0%) N/A N/A Necrotic A mount: Fat Layer (Subcutaneous Tissue): Yes N/A N/A Exposed Structures: Fascia: No Tendon: No Muscle: No Joint: No Bone: No Small (1-33%) N/A N/A Epithelialization: Debridement - Excisional N/A N/A Debridement: Pre-procedure Verification/Time Out 10:45 N/A N/A Taken: Lidocaine N/A N/A Pain Control: Callus, Subcutaneous, Slough N/A N/A Tissue Debrided: Skin/Subcutaneous Tissue N/A N/A Level: 1.95 N/A N/A Debridement A (sq cm): rea Curette N/A N/A Instrument: Minimum N/A N/A Bleeding: Pressure N/A N/A Hemostasis A chieved: 0 N/A N/A Procedural Pain: 0 N/A N/A Post Procedural Pain: Procedure was tolerated well N/A N/A Debridement Treatment Response: 1.3x1.5x0.2 N/A N/A Post Debridement Measurements L x W x D (cm) 0.306 N/A N/A Post Debridement Volume: (cm) NICHOLAOS, SCHIPPERS Roberts ([id number]) [id number] Page 4 of 8 Callus: Yes N/A N/A Periwound Skin Texture: Excoriation: No Induration: No Crepitus: No Rash: No Scarring: No Maceration: No N/A N/A Periwound Skin Moisture: Dry/Scaly: No Atrophie Blanche: No N/A N/A Periwound Skin Color: Cyanosis: No Ecchymosis: No Erythema: No Hemosiderin Staining: No Mottled: No Pallor: No Rubor: No No Abnormality N/A N/A Temperature: Chemical Cauterization N/A N/A Procedures  Performed: Debridement Treatment Notes Wound #1 (Foot) Wound Laterality: Plantar, Left Cleanser Soap and Water Discharge Instruction: May shower and wash wound with dial antibacterial soap and water prior to dressing change. Peri-Wound Care Topical Primary Dressing MediHoney Gel, tube 1.5 (oz) Discharge Instruction: Apply to wound bed as instructed KerraCel Ag Gelling Fiber Dressing, 4x5 in (silver alginate) Discharge Instruction: Apply silver alginate to wound bed as instructed Secondary Dressing ADAPTIC TOUCH 3x4.25 in Discharge Instruction: Apply over primary dressing as directed. Woven Gauze Sponge, Non-Sterile 4x4 in Discharge Instruction: Apply over primary dressing as directed. Secured With Elastic Bandage 4 inch (ACE bandage) Discharge Instruction: Secure with ACE bandage as directed. Kerlix Roll Sterile, 4.5x3.1 (in/yd) Discharge Instruction: Secure with Kerlix as directed. Compression Wrap Compression Stockings Add-Ons Electronic Signature(s) Signed: 05/03/2022 2:30:41 PM By: Kalman Shan DO Entered By: Kalman Shan on 05/03/2022 11:39:29 -------------------------------------------------------------------------------- Multi-Disciplinary Care  Plan Details Patient Name: Date of Service: [name] 05/03/2022 10:00 A M Medical Record Number: [id number] Patient Account Number: [id number] Date of Birth/Sex: Treating RN: 1940/10/16 (82 y.o. [name], [name] Primary Care Miroslava Santellan: [name] Other Clinician: Referring [place] Luppino: Treating Priseis Cratty/Extender: [name] in Treatment: [address], [place] ([id number]) [id number] Page 5 of 8 Active Inactive Nutrition Nursing Diagnoses: Impaired glucose control: actual or potential Goals: Patient/caregiver verbalizes understanding of need to maintain therapeutic glucose control per primary care physician Date Initiated: 03/01/2022 Target Resolution Date:  03/29/2022 Goal Status: Active Interventions: Assess HgA1c results as ordered upon admission and as needed Treatment Activities: Obtain HgA1c : 03/01/2022 Notes: Wound/Skin Impairment Nursing Diagnoses: Impaired tissue integrity Goals: Patient/caregiver will verbalize understanding of skin care regimen Date Initiated: 12/26/2021 Date Inactivated: 03/01/2022 Target Resolution Date: 03/23/2022 Unmet Reason: patient refuses Goal Status: Unmet offloading, advance tissue products, and following providers plan of care. Ulcer/skin breakdown will have a volume reduction of 30% by week 4 Date Initiated: 12/26/2021 Date Inactivated: 01/23/2022 Target Resolution Date: 01/23/2022 Goal Status: Met Ulcer/skin breakdown will have a volume reduction of 50% by week 8 Date Initiated: 01/23/2022 Target Resolution Date: 03/23/2022 Goal Status: Active Interventions: Assess patient/caregiver ability to obtain necessary supplies Assess patient/caregiver ability to perform ulcer/skin care regimen upon admission and as needed Assess ulceration(s) every visit Provide education on ulcer and skin care Treatment Activities: Topical wound management initiated : 12/26/2021 Notes: 01/23/22: Wound care regimen continues. Wound greater than 30% volume reduction, new goal initiated. Pt. not compliant to recommendations/ plan of care prescribed by Dr. Heber [place]. Electronic Signature(s) Signed: 05/30/2022 5:35:40 PM By: Tyrone Hammock RN Entered By: [name] on 05/03/2022 10:45:00 -------------------------------------------------------------------------------- Pain Assessment Details Patient Name: Date of Service: [name], Tyrone [name]. 05/03/2022 10:00 A M Medical Record Number: [id number] Patient Account Number: [id number] Date of Birth/Sex: Treating RN: [birth year] (82 y.o. [name] Primary Care Korver Graybeal: [name] Other Clinician: Referring Othniel Maret: Treating Talisa Petrak/Extender: [name] Weeks in Treatment: 18 Active Problems Location of Pain Severity and Description of Pain KEEVIN, PANEBIANCO ([id number]) [phone].pdf Page 6 of 8 Patient Has Paino No Site Locations Rate the pain. Current Pain Level: 0 Pain Management and Medication Current Pain Management: Medication: No Cold Application: No Rest: No Massage: No Activity: No T.RobertsN.S.: No Heat Application: No Leg drop or elevation: No Is the Current Pain Management Adequate: Adequate How does your wound impact your activities of daily livingo Sleep: No Bathing: No Appetite: No Relationship With Others: No Bladder Continence: No Emotions: No Bowel Continence: No Work: No Toileting: No Drive: No Dressing: No Hobbies: No Engineer, maintenance) Signed: 05/03/2022 5:22:15 PM By: Tyrone Pilling RN, BSN Entered By: [name] on 05/03/2022 10:28:18 -------------------------------------------------------------------------------- Patient/Caregiver Education Details Patient Name: Date of Service: [name], Tyrone [name]. 12/7/2023andnbsp10:00 A M Medical Record Number: [id number] Patient Account Number: [id number] Date of Birth/Gender: Treating RN: 1941-04-13 (82 y.o. [name] Primary Care Physician: [name] Other Clinician: Referring Physician: Treating Physician/Extender: [name] in Treatment: 18 Education Assessment Education Provided To: Patient Education Topics Provided Wound/Skin Impairment: Methods: Explain/Verbal Responses: Reinforcements needed, State content correctly Electronic Signature(s) Signed: 05/30/2022 5:35:40 PM By: Tyrone Hammock RN Entered By: [name] on 05/03/2022 10:45:16 Milhouse, [name] ([id number]) [id number] Page 7 of 8 -------------------------------------------------------------------------------- Wound Assessment Details Patient Name: Date of  Service: [name]. 05/03/2022 10:00 A M Medical Record Number: [id number] Patient  Account Number: [id number] Date of Birth/Sex: Treating RN: [birth year] (82 y.o. [name] Primary Care Gini Caputo: [name] Other Clinician: Referring Haivyn Oravec: Treating Beila Purdie/Extender: [name] Weeks in Treatment: 18 Wound Status Wound Number: 1 Primary Diabetic Wound/Ulcer of the Lower Extremity Etiology: Wound Location: Left, Plantar Foot Wound Open Wounding Event: Gradually Appeared Status: Date Acquired: 01/24/2021 Comorbid Arrhythmia, Congestive Heart Failure, Coronary Artery Disease, Weeks Of Treatment: 18 History: Type II Diabetes, Osteoarthritis, Neuropathy Clustered Wound: No Photos Wound Measurements Length: (cm) 1.3 Width: (cm) 1.5 Depth: (cm) 0.2 Area: (cm) 1.532 Volume: (cm) 0.306 % Reduction in Area: 4.4% % Reduction in Volume: 36.4% Epithelialization: Small (1-33%) Tunneling: No Undermining: No Wound Description Classification: Grade 1 Wound Margin: Well defined, not attached Exudate Amount: Medium Exudate Type: Serosanguineous Exudate Color: red, brown Foul Odor After Cleansing: No Slough/Fibrino No Wound Bed Granulation Amount: Large (67-100%) Exposed Structure Granulation Quality: Red, Hyper-granulation Fascia Exposed: No Necrotic Amount: None Present (0%) Fat Layer (Subcutaneous Tissue) Exposed: Yes Tendon Exposed: No Muscle Exposed: No Joint Exposed: No Bone Exposed: No Periwound Skin Texture Texture Color No Abnormalities Noted: No No Abnormalities Noted: Yes Callus: Yes Temperature / Pain Crepitus: No Temperature: No Abnormality Excoriation: No Induration: No Rash: No Scarring: No Moisture No Abnormalities NotedDEDRIC, ETHINGTON ([id number]) [id number] Page 8 of 8 Electronic Signature(s) Signed: 05/03/2022 5:22:15 PM By: Tyrone Pilling RN, BSN Entered By: [name] on  05/03/2022 10:32:04 -------------------------------------------------------------------------------- Vitals Details Patient Name: Date of Service: [name], Tyrone [name]. 05/03/2022 10:00 A M Medical Record Number: [id number] Patient Account Number: [id number] Date of Birth/Sex: Treating RN: August 13, 1940 (82 y.o. [name] Primary Care Aimi Essner: [name] Other Clinician: Referring Aminah Zabawa: Treating Shaliah Wann/Extender: [name] in Treatment: 18 Vital Signs Time Taken: 10:28 Temperature (F): 97.6 Height (in): 72 Pulse (bpm): 71 Weight (lbs): 198 Respiratory Rate (breaths/min): 20 Body Mass Index (BMI): 26.9 Blood Pressure (mmHg): 108/66 Capillary Blood Glucose (mg/dl): 108 Reference Range: 80 - 120 mg / dl Electronic Signature(s) Signed: 05/03/2022 5:22:15 PM By: Tyrone Pilling RN, BSN Entered By: [name] on 05/03/2022 10:28:40

## 2022-05-31 NOTE — Progress Notes (Signed)
[name], [name] ([id number]) [id number] Page 1 of 9 Visit Report for 05/03/2022 Chief Complaint Document Details Patient Name: Date of Service: [name] 05/03/2022 10:00 A M Medical Record Number: [id number] Patient Account Number: [id number] Date of Birth/Sex: Treating RN: [birth year] (82 y.o. M) Primary Care Provider: Dimas Chyle Other Clinician: Referring Provider: Treating Provider/Extender: Laurena Slimmer Weeks in Treatment: 18 Information Obtained from: Patient Chief Complaint 12/26/2021; left foot plantar foot wound Electronic Signature(s) Signed: 05/03/2022 2:30:41 PM By: [name] Entered By: [name] on 05/03/2022 11:39:39 -------------------------------------------------------------------------------- Debridement Details Patient Name: Date of Service: [name], Tyrone Tyrone E. 05/03/2022 10:00 A M Medical Record Number: [id number] Patient Account Number: [id number] Date of Birth/Sex: Treating RN: 1941-05-23 (82 y.o. [name], [name] Primary Care Provider: Dimas Chyle Other Clinician: Referring Provider: Treating Provider/Extender: [name] in Treatment: 18 Debridement Performed for Assessment: Wound #1 Left,Plantar Foot Performed By: Physician Tyrone Shan, Roberts Debridement Type: Debridement Severity of Tissue Pre Debridement: Fat layer exposed Level of Consciousness (Pre-procedure): Awake and Alert Pre-procedure Verification/Time Out Yes - 10:45 Taken: Start Time: 10:45 Pain Control: Lidocaine T Area Debrided (L x W): otal 1.3 (cm) x 1.5 (cm) = 1.95 (cm) Tissue and other material debrided: Viable, Non-Viable, Callus, Slough, Subcutaneous, Slough Level: Skin/Subcutaneous Tissue Debridement Description: Excisional Instrument: Curette Bleeding: Minimum Hemostasis Achieved: Pressure End Time: 10:45 Procedural Pain: 0 Post Procedural Pain: 0 Response to Treatment:  Procedure was tolerated well Level of Consciousness (Post- Awake and Alert procedure): Post Debridement Measurements of Total Wound Length: (cm) 1.3 Width: (cm) 1.5 Depth: (cm) 0.2 Volume: (cm) 0.306 Character of Wound/Ulcer Post Debridement: Improved Severity of Tissue Post Debridement: Fat layer exposed SHANKAR, SILBER E ([id number]) [id number] Page 2 of 9 Post Procedure Diagnosis Same as Pre-procedure Electronic Signature(s) Signed: 05/03/2022 2:30:41 PM By: [name] Signed: 05/30/2022 5:35:40 PM By: Tyrone Hammock RN Entered By: [name] on 05/03/2022 10:47:44 -------------------------------------------------------------------------------- HPI Details Patient Name: Date of Service: [name], Tyrone Tyrone E. 05/03/2022 10:00 A M Medical Record Number: [id number] Patient Account Number: [id number] Date of Birth/Sex: Treating RN: 05-11-1941 (82 y.o. M) Primary Care Provider: Dimas Chyle Other Clinician: Referring Provider: Treating Provider/Extender: [name] in Treatment: 18 History of Present Illness HPI Description: 12/26/2021 [name] is an 82 year old male with a past medical history of controlled type 2 diabetes, A-fib on Eliquis, chronic systolic heart failure on Entresto and right below knee amputation (2017) that presents to the clinic for a left plantar foot wound. He states this has been present for 1 year and is not sure how it started. He has peripheral neuropathy. He follows with Dr. Sherryle Lis, podiatry for this issue. He had an MRI on 10/16/2021 that did not show evidence of osteomyelitis. He has tried collagen with some benefit. Currently patient is keeping the area covered with a bandaid and no dressing. Due to his right BKA he has issues with balance and uses a cane. He is not a good candidate for the total contact cast. Surgical exostectomy has been thought of for offloading however risk and  recovery is high. Patient currently denies signs of infection. 8/15; patient presents for follow-up. He did not pick up her Granix from the pharmacy. He has been keeping the area covered. We discussed potentially doing a skin substitute and he was interested in this. He denies signs of infection. 8/29; patient presents for follow-up. He has been using Medihoney to the wound  bed. He is not able to offload this area due to having a prosthesis. We have not heard back for insurance approval of a skin substitute. 9/12; patient presents for follow-up. We have been using Medihoney to the wound bed. He has no issues or complaints today. He has been approved for Grafix. We discussed using this at next clinic visit and he was agreeable. 9/21; patient presents for follow-up. He has been using Medihoney to the wound bed. Grafix is available and patient is agreeable to having this placed today. He knows to not get this wet and to return in 1 week. 9/28; this is a patient who has a wound on his left plantar foot for about 2-1/2 to 3 months. Previous BKA on the right which complicates our ability to offload this. We have been using Grafix and we reapplied this today postdebridement 10/5; patient presents for follow-up. He declines having the skin substitute placed again today. He wants to go back to Medihoney. He states he wants to be able to see the wound bed daily. 11/9; patient presents for follow-up. He has been using Medihoney daily to the wound bed. 12/7; patient presents for follow-up. Has been using Medihoney to the wound bed. He has not been using silver alginate. He denies signs of infection. He is still not interested in doing a skin substitute. Electronic Signature(s) Signed: 05/03/2022 2:30:41 PM By: [name] Entered By: [name] on 05/03/2022 11:40:39 -------------------------------------------------------------------------------- Chemical Cauterization Details Patient Name:  Date of Service: [name], Tyrone Tyrone E. 05/03/2022 10:00 A M Medical Record Number: [id number] Patient Account Number: [id number] Date of Birth/Sex: Treating RN: 1940-09-22 (82 y.o. [name] Primary Care Provider: Dimas Chyle Other Clinician: Referring Provider: Treating Provider/Extender: [name], [name] ([id number]) [id number] Page 3 of 9 Weeks in Treatment: 18 Procedure Performed for: Wound #1 Left,Plantar Foot Performed By: Physician Tyrone Shan, Roberts Post Procedure Diagnosis Same as Pre-procedure Electronic Signature(s) Signed: 05/03/2022 2:30:41 PM By: [name] Signed: 05/30/2022 5:35:40 PM By: Tyrone Hammock RN Entered By: [name] on 05/03/2022 10:44:07 -------------------------------------------------------------------------------- Physical Exam Details Patient Name: Date of Service: [name], Tyrone Tyrone E. 05/03/2022 10:00 A M Medical Record Number: [id number] Patient Account Number: [id number] Date of Birth/Sex: Treating RN: May 06, 1941 (82 y.o. M) Primary Care Provider: Dimas Chyle Other Clinician: Referring Provider: Treating Provider/Extender: [name] in Treatment: 18 Constitutional respirations regular, non-labored and within target range for patient.. Cardiovascular 2+ dorsalis pedis/posterior tibialis pulses. Psychiatric pleasant and cooperative. Notes Left foot: T the plantar aspect there is an open wound with hyper granulated tissue, slough and callus. No signs of surrounding soft tissue infection. o Electronic Signature(s) Signed: 05/03/2022 2:30:41 PM By: [name] Entered By: [name] on 05/03/2022 11:41:15 -------------------------------------------------------------------------------- Physician Orders Details Patient Name: Date of Service: [name], Tyrone Tyrone E. 05/03/2022 10:00 A M Medical Record Number: [id number] Patient  Account Number: [id number] Date of Birth/Sex: Treating RN: [birth year] (82 y.o. [name] Primary Care Provider: Dimas Chyle Other Clinician: Referring Provider: Treating Provider/Extender: [name] in Treatment: [phone] Verbal / Phone Orders: No Diagnosis Coding Follow-up Appointments ppointment in 2 weeks. - w/ Dr. Heber  (any day) Return A Other: - [employer] sells medihoney you can purchase. Anesthetic (In clinic) Topical Lidocaine 5% applied to wound bed [name], [name] ([id number]) [phone] Page 4 of 9 Cellular or Tissue Based Products Cellular or Tissue Based Product Type: - Grafix PL  Prime 2x3 #1, #2 02/22/22 Patient refused Grafix now. Bathing/ Shower/ Hygiene May shower and wash wound with soap and water. Edema Control - Lymphedema / SCD / Other Elevate legs to the level of the heart or above for 30 minutes daily and/or when sitting, a frequency of: - several times a day Avoid standing for long periods of time. Patient to wear own compression stockings every day. Off-Loading Other: - Use a peg assist insert from clinic (we will cut a hole out for offloading of your wound) Additional Orders / Instructions Follow Nutritious Diet - Monitor/Control Blood Sugars Wound Treatment Wound #1 - Foot Wound Laterality: Plantar, Left Cleanser: Soap and Water Every Other Day/30 Days Discharge Instructions: May shower and wash wound with dial antibacterial soap and water prior to dressing change. Prim Dressing: MediHoney Gel, tube 1.5 (oz) Every Other Day/30 Days ary Discharge Instructions: Apply to wound bed as instructed Prim Dressing: KerraCel Ag Gelling Fiber Dressing, 4x5 in (silver alginate) (Generic) Every Other Day/30 Days ary Discharge Instructions: Apply silver alginate to wound bed as instructed Secondary Dressing: ADAPTIC TOUCH 3x4.25 in Every Other Day/30 Days Discharge Instructions: Apply over  primary dressing as directed. Secondary Dressing: Woven Gauze Sponge, Non-Sterile 4x4 in Every Other Day/30 Days Discharge Instructions: Apply over primary dressing as directed. Secured With: Elastic Bandage 4 inch (ACE bandage) Every Other Day/30 Days Discharge Instructions: Secure with ACE bandage as directed. Secured With: [employer], 4.5x3.1 (in/yd) Every Other Day/30 Days Discharge Instructions: Secure with Kerlix as directed. Electronic Signature(s) Signed: 05/03/2022 2:30:41 PM By: [name] Entered By: [name] on 05/03/2022 11:42:07 -------------------------------------------------------------------------------- Problem List Details Patient Name: Date of Service: [name], Tyrone Tyrone E. 05/03/2022 10:00 A M Medical Record Number: [id number] Patient Account Number: [id number] Date of Birth/Sex: Treating RN: [birth year] (82 y.o. 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 12/26/2021 No Yes JEPTHA, HINNENKAMP ([id number]) [id number] Page 5 of 9 E11.42 Type 2 diabetes mellitus with diabetic polyneuropathy 12/26/2021 No Yes C37.62 Chronic systolic (congestive) heart failure 12/26/2021 No Yes I48.0 Paroxysmal atrial fibrillation 12/26/2021 No Yes Z89.511 Acquired absence of right leg below knee 12/26/2021 No Yes Inactive Problems Resolved Problems Electronic Signature(s) Signed: 05/03/2022 2:30:41 PM By: [name] Entered By: [name] on 05/03/2022 11:39:23 -------------------------------------------------------------------------------- Progress Note Details Patient Name: Date of  Service: [name], Tyrone Tyrone E. 05/03/2022 10:00 A M Medical Record Number: [id number] Patient Account Number: [id number] Date of Birth/Sex: Treating RN: [birth year] (82 y.o. M) Primary Care Provider: Dimas Chyle Other Clinician: Referring Provider: Treating Provider/Extender: [name] in Treatment: 18 Subjective Chief Complaint Information obtained from Patient 12/26/2021; left foot plantar foot wound History of Present Illness (HPI) 12/26/2021 Mr. Ran Tullis is an 82 year old male with a past medical history of controlled type 2 diabetes, A-fib on Eliquis, chronic systolic heart failure on Entresto and right below knee amputation (2017) that presents to the clinic for a left plantar foot wound. He states this has been present for 1 year and is not sure how it started. He has peripheral neuropathy. He follows with Dr. Sherryle Lis, podiatry for this issue. He had an MRI on 10/16/2021 that did not show evidence of osteomyelitis. He has  tried collagen with some benefit. Currently patient is keeping the area covered with a bandaid and no dressing. Due to his right BKA he has issues with balance and uses a cane. He is not a good candidate for the total contact cast. Surgical exostectomy has been thought of for offloading however risk and recovery is high. Patient currently denies signs of infection. 8/15; patient presents for follow-up. He did not pick up her Granix from the pharmacy. He has been keeping the area covered. We discussed potentially doing a skin substitute and he was interested in this. He denies signs of infection. 8/29; patient presents for follow-up. He has been using Medihoney to the wound bed. He is not able to offload this area due to having a prosthesis. We have not heard back for insurance approval of a skin substitute. 9/12; patient presents for follow-up. We have been using Medihoney to the wound bed. He has no issues or complaints today. He has been  approved for Grafix. We discussed using this at next clinic visit and he was agreeable. 9/21; patient presents for follow-up. He has been using Medihoney to the wound bed. Grafix is available and patient is agreeable to having this placed today. He knows to not get this wet and to return in 1 week. 9/28; this is a patient who has a wound on his left plantar foot for about 2-1/2 to 3 months. Previous BKA on the right which complicates our ability to offload this. We have been using Grafix and we reapplied this today postdebridement 10/5; patient presents for follow-up. He declines having the skin substitute placed again today. He wants to go back to Medihoney. He states he wants to be able to see the wound bed daily. 11/9; patient presents for follow-up. He has been using Medihoney daily to the wound bed. 12/7; patient presents for follow-up. Has been using Medihoney to the wound bed. He has not been using silver alginate. He denies signs of infection. He is still not interested in doing a skin substitute. [name], [name] ([id number]) [id number] Page 6 of 9 Patient History Information obtained from Patient, Chart. Family History Heart Disease - Siblings, Hypertension - Siblings, Stroke - Father, No family history of Cancer, Diabetes, Hereditary Spherocytosis, Kidney Disease, Lung Disease, Seizures, Thyroid Problems, Tuberculosis. Social History Former smoker - Quit in 1983, Marital Status - Widowed, Alcohol Use - Rarely, Drug Use - No History, Caffeine Use - Rarely. Medical History Cardiovascular Patient has history of Arrhythmia, Congestive Heart Failure, Coronary Artery Disease Endocrine Patient has history of Type II Diabetes Musculoskeletal Patient has history of Osteoarthritis Neurologic Patient has history of Neuropathy Medical A Surgical History Notes nd Genitourinary Hx BPH Oncologic Basal Cell Carcinoma  (Face) Objective Constitutional respirations regular, non-labored and within target range for patient.. Vitals Time Taken: 10:28 AM, Height: 72 in, Weight: 198 lbs, BMI: 26.9, Temperature: 97.6 F, Pulse: 71 bpm, Respiratory Rate: 20 breaths/min, Blood Pressure: 108/66 mmHg, Capillary Blood Glucose: 108 mg/dl. Cardiovascular 2+ dorsalis pedis/posterior tibialis pulses. Psychiatric pleasant and cooperative. General Notes: Left foot: T the plantar aspect there is an open wound with hyper granulated tissue, slough and callus. No signs of surrounding soft tissue o infection. Integumentary (Hair, Skin) Wound #1 status is Open. Original cause of wound was Gradually Appeared. The date acquired was: 01/24/2021. The wound has been in treatment 18 weeks. The wound is located on the [name]. The wound measures 1.3cm length x 1.5cm width x 0.2cm depth; 1.532cm^2 area and 0.306cm^3 volume. There  is Fat Layer (Subcutaneous Tissue) exposed. There is no tunneling or undermining noted. There is a medium amount of serosanguineous drainage noted. The wound margin is well defined and not attached to the wound base. There is large (67-100%) red, hyper - granulation within the wound bed. There is no necrotic tissue within the wound bed. The periwound skin appearance had no abnormalities noted for moisture. The periwound skin appearance had no abnormalities noted for color. The periwound skin appearance exhibited: Callus. The periwound skin appearance did not exhibit: Crepitus, Excoriation, Induration, Rash, Scarring. Periwound temperature was noted as No Abnormality. Assessment Active Problems ICD-10 Type 2 diabetes mellitus with foot ulcer Non-pressure chronic ulcer of other part of left foot with fat layer exposed Type 2 diabetes mellitus with diabetic polyneuropathy Chronic systolic (congestive) heart failure Paroxysmal atrial fibrillation Acquired absence of right leg below knee Patient's wound  is stable. I used silver nitrate to the hyper granulated areas and debrided nonviable tissue. I recommended to continue to offload the wound bed. He states he never obtained callus pads. At this time I recommended a peg assist insert. Continue Medihoney and add silver alginate. Follow-up in 2 weeks. [name], [name] ([id number]) [id number] Page 7 of 9 Procedures Wound #1 Pre-procedure diagnosis of Wound #1 is a Diabetic Wound/Ulcer of the Lower Extremity located on the Left,Plantar Foot .Severity of Tissue Pre Debridement is: Fat layer exposed. There was a Excisional Skin/Subcutaneous Tissue Debridement with a total area of 1.95 sq cm performed by Tyrone Shan, Roberts. With the following instrument(s): Curette to remove Viable and Non-Viable tissue/material. Material removed includes Callus, Subcutaneous Tissue, and Slough after achieving pain control using Lidocaine. No specimens were taken. A time out was conducted at 10:45, prior to the start of the procedure. A Minimum amount of bleeding was controlled with Pressure. The procedure was tolerated well with a pain level of 0 throughout and a pain level of 0 following the procedure. Post Debridement Measurements: 1.3cm length x 1.5cm width x 0.2cm depth; 0.306cm^3 volume. Character of Wound/Ulcer Post Debridement is improved. Severity of Tissue Post Debridement is: Fat layer exposed. Post procedure Diagnosis Wound #1: Same as Pre-Procedure Pre-procedure diagnosis of Wound #1 is a Diabetic Wound/Ulcer of the Lower Extremity located on the Left,Plantar Foot . An Chemical Cauterization procedure was performed by Tyrone Shan, Roberts. Post procedure Diagnosis Wound #1: Same as Pre-Procedure Plan Follow-up Appointments: Return Appointment in 2 weeks. - w/ Dr. Heber [place] (any day) Other: - [employer] sells medihoney you can purchase. Anesthetic: (In clinic) Topical Lidocaine 5% applied to wound bed Cellular or Tissue  Based Products: Cellular or Tissue Based Product Type: - Grafix PL Prime 2x3 #1, #2 02/22/22 Patient refused Grafix now. Bathing/ Shower/ Hygiene: May shower and wash wound with soap and water. Edema Control - Lymphedema / SCD / Other: Elevate legs to the level of the heart or above for 30 minutes daily and/or when sitting, a frequency of: - several times a day Avoid standing for long periods of time. Patient to wear own compression stockings every day. Off-Loading: Other: - Use a peg assist insert from clinic (we will cut a hole out for offloading of your wound) Additional Orders / Instructions: Follow Nutritious Diet - Monitor/Control Blood Sugars WOUND #1: - Foot Wound Laterality: Plantar, Left Cleanser: Soap and Water Every Other Day/30 Days Discharge Instructions: May shower and wash wound with dial antibacterial soap and water prior to dressing change. Prim Dressing: MediHoney Gel, tube 1.5 (oz) Every Other Day/30 Days  ary Discharge Instructions: Apply to wound bed as instructed Prim Dressing: KerraCel Ag Gelling Fiber Dressing, 4x5 in (silver alginate) (Generic) Every Other Day/30 Days ary Discharge Instructions: Apply silver alginate to wound bed as instructed Secondary Dressing: ADAPTIC TOUCH 3x4.25 in Every Other Day/30 Days Discharge Instructions: Apply over primary dressing as directed. Secondary Dressing: Woven Gauze Sponge, Non-Sterile 4x4 in Every Other Day/30 Days Discharge Instructions: Apply over primary dressing as directed. Secured With: Elastic Bandage 4 inch (ACE bandage) Every Other Day/30 Days Discharge Instructions: Secure with ACE bandage as directed. Secured With: [employer], 4.5x3.1 (in/yd) Every Other Day/30 Days Discharge Instructions: Secure with Kerlix as directed. 1. In office sharp debridement 2. Medihoney and silver alginate 3. Aggressive offloadingoopeg assist 4. Follow-up in 2 weeks Electronic Signature(s) Signed: 05/03/2022 2:30:41 PM  By: [name] Entered By: [name] on 05/03/2022 11:50:22 -------------------------------------------------------------------------------- HxROS Details Patient Name: Date of Service: [name], Tyrone Tyrone E. 05/03/2022 10:00 A M Medical Record Number: [id number] Patient Account Number: [id number] Date of Birth/Sex: Treating RN: 1940/09/19 (82 y.o. M) Primary Care Provider: Dimas Chyle Other Clinician: Referring Provider: Treating Provider/Extender: Aurel, Nguyen ([id number]) [id number] Page 8 of 9 Weeks in Treatment: 18 Information Obtained From Patient Chart Cardiovascular Medical History: Positive for: Arrhythmia; Congestive Heart Failure; Coronary Artery Disease Endocrine Medical History: Positive for: Type II Diabetes Treated with: Oral agents Genitourinary Medical History: Past Medical History Notes: Hx BPH Musculoskeletal Medical History: Positive for: Osteoarthritis Neurologic Medical History: Positive for: Neuropathy Oncologic Medical History: Past Medical History Notes: Basal Cell Carcinoma (Face) Immunizations Pneumococcal Vaccine: Received Pneumococcal Vaccination: Yes Received Pneumococcal Vaccination On or After 60th Birthday: Yes Implantable Devices None Family and Social History Cancer: No; Diabetes: No; Heart Disease: Yes - Siblings; Hereditary Spherocytosis: No; Hypertension: Yes - Siblings; Kidney Disease: No; Lung Disease: No; Seizures: No; Stroke: Yes - Father; Thyroid Problems: No; Tuberculosis: No; Former smoker - Quit in 1983; Marital Status - Widowed; Alcohol Use: Rarely; Drug Use: No History; Caffeine Use: Rarely; Financial Concerns: No; Food, Clothing or Shelter Needs: No; Support System Lacking: No; Transportation Concerns: No Electronic Signature(s) Signed: 05/03/2022 2:30:41 PM By: [name] Entered By: [name] on 05/03/2022  11:40:46 -------------------------------------------------------------------------------- SuperBill Details Patient Name: Date of Service: [name], Tyrone Tyrone E. 05/03/2022 Medical Record Number: [id number] Patient Account Number: [id number] Date of Birth/Sex: Treating RN: June 09, 1940 (82 y.o. [name], [name] Primary Care Provider: Dimas Chyle Other Clinician: Referring Provider: Treating Provider/Extender: [name] in Treatment: [address] ([id number]) [id number] Page 9 of 9 ICD-10 Codes Code Description E11.621 Type 2 diabetes mellitus with foot ulcer L97.522 Non-pressure chronic ulcer of other part of left foot with fat layer exposed E11.42 Type 2 diabetes mellitus with diabetic polyneuropathy T65.46 Chronic systolic (congestive) heart failure I48.0 Paroxysmal atrial fibrillation Z89.511 Acquired absence of right leg below knee Facility Procedures : CPT4 Code: [id number] Description: 81275 - DEB SUBQ TISSUE 20 SQ CM/< ICD-10 Diagnosis Description L97.522 Non-pressure chronic ulcer of other part of left foot with fat layer exposed Modifier: Quantity: 1 Physician Procedures : CPT4 Code Description Modifier [id number] - WC PHYS SUBQ TISS 20 SQ CM ICD-10 Diagnosis Description L97.522 Non-pressure chronic ulcer of other part of left foot with fat layer exposed Quantity: 1 Electronic Signature(s) Signed: 05/03/2022 2:30:41 PM By: [name] Entered By: [name] on 05/03/2022 11:50:38

## 2022-06-01 NOTE — Telephone Encounter (Signed)
Patient requests to be called regarding: Patient states he wants to know why he has been prescribed pantoprazole (PROTONIX) 40 MG tablet which is causing Patient to have diarrhea related issues

## 2022-06-01 NOTE — Telephone Encounter (Signed)
Left message to return call to our office at their convenience.  

## 2022-06-04 ENCOUNTER — Encounter (HOSPITAL_BASED_OUTPATIENT_CLINIC_OR_DEPARTMENT_OTHER): Payer: Medicare Other | Attending: Internal Medicine | Admitting: Internal Medicine

## 2022-06-04 DIAGNOSIS — Z89511 Acquired absence of right leg below knee: Secondary | ICD-10-CM | POA: Diagnosis not present

## 2022-06-04 DIAGNOSIS — I480 Paroxysmal atrial fibrillation: Secondary | ICD-10-CM | POA: Insufficient documentation

## 2022-06-04 DIAGNOSIS — Z7901 Long term (current) use of anticoagulants: Secondary | ICD-10-CM | POA: Insufficient documentation

## 2022-06-04 DIAGNOSIS — I5022 Chronic systolic (congestive) heart failure: Secondary | ICD-10-CM | POA: Diagnosis not present

## 2022-06-04 DIAGNOSIS — L97522 Non-pressure chronic ulcer of other part of left foot with fat layer exposed: Secondary | ICD-10-CM | POA: Insufficient documentation

## 2022-06-04 DIAGNOSIS — M1990 Unspecified osteoarthritis, unspecified site: Secondary | ICD-10-CM | POA: Diagnosis not present

## 2022-06-04 DIAGNOSIS — E11621 Type 2 diabetes mellitus with foot ulcer: Secondary | ICD-10-CM | POA: Insufficient documentation

## 2022-06-04 DIAGNOSIS — I2510 Atherosclerotic heart disease of native coronary artery without angina pectoris: Secondary | ICD-10-CM | POA: Insufficient documentation

## 2022-06-04 DIAGNOSIS — Z85828 Personal history of other malignant neoplasm of skin: Secondary | ICD-10-CM | POA: Insufficient documentation

## 2022-06-04 DIAGNOSIS — E1142 Type 2 diabetes mellitus with diabetic polyneuropathy: Secondary | ICD-10-CM | POA: Diagnosis not present

## 2022-06-04 NOTE — Telephone Encounter (Signed)
Patient returned Stella's call. Requests to be called.

## 2022-06-05 NOTE — Progress Notes (Signed)
[name] ([id number]) [id number] Page 1 of 8 Visit Report for 06/04/2022 Chief Complaint Document Details Patient Name: Date of Service: [name] 06/04/2022 11:15 A M Medical Record Number: [id number] Patient Account Number: [id number] Date of Birth/Sex: Treating RN: 05-21-1941 (82 y.o. M) Primary Care Provider: Dimas Chyle Other Clinician: Referring Provider: Treating Provider/Extender: Laurena Slimmer Weeks in Treatment: 22 Information Obtained from: Patient Chief Complaint 12/26/2021; left foot plantar foot wound Electronic Signature(s) Signed: 06/04/2022 12:33:43 PM By: Kalman Shan DO Entered By: Kalman Shan on 06/04/2022 11:39:28 -------------------------------------------------------------------------------- HPI Details Patient Name: Date of Service: Tyrone Meigs, [name] 06/04/2022 11:15 A M Medical Record Number: [id number] Patient Account Number: [id number] Date of Birth/Sex: Treating RN: March 05, 1941 (82 y.o. M) Primary Care Provider: Dimas Chyle Other Clinician: Referring Provider: Treating Provider/Extender: Jessee Avers in Treatment: 22 History of Present Illness HPI Description: 12/26/2021 Mr. Jayleen Afonso is an 82 year old male with a past medical history of controlled type 2 diabetes, A-fib on Eliquis, chronic systolic heart failure on Entresto and right below knee amputation (2017) that presents to the clinic for a left plantar foot wound. He states this has been present for 1 year and is not sure how it started. He has peripheral neuropathy. He follows with Dr. Sherryle Lis, podiatry for this issue. He had an MRI on 10/16/2021 that did not show evidence of osteomyelitis. He has tried collagen with some benefit. Currently patient is keeping the area covered with a bandaid and no dressing. Due to his right BKA he has issues with balance and uses a cane. He is not a good candidate for the total  contact cast. Surgical exostectomy has been thought of for offloading however risk and recovery is high. Patient currently denies signs of infection. 8/15; patient presents for follow-up. He did not pick up her Granix from the pharmacy. He has been keeping the area covered. We discussed potentially doing a skin substitute and he was interested in this. He denies signs of infection. 8/29; patient presents for follow-up. He has been using Medihoney to the wound bed. He is not able to offload this area due to having a prosthesis. We have not heard back for insurance approval of a skin substitute. 9/12; patient presents for follow-up. We have been using Medihoney to the wound bed. He has no issues or complaints today. He has been approved for Grafix. We discussed using this at next clinic visit and he was agreeable. 9/21; patient presents for follow-up. He has been using Medihoney to the wound bed. Grafix is available and patient is agreeable to having this placed today. He knows to not get this wet and to return in 1 week. 9/28; this is a patient who has a wound on his left plantar foot for about 2-1/2 to 3 months. Previous BKA on the right which complicates our ability to offload this. We have been using Grafix and we reapplied this today postdebridement 10/5; patient presents for follow-up. He declines having the skin substitute placed again today. He wants to go back to Medihoney. He states he wants to be able to see the wound bed daily. 11/9; patient presents for follow-up. He has been using Medihoney daily to the wound bed. 12/7; patient presents for follow-up. Has been using Medihoney to the wound bed. He has not been using silver alginate. He denies signs of infection. He is still not interested in doing a skin substitute. VORIS, TIGERT ([id number]) [id number] Page 2  of 8 12/21; patient presents for follow-up. He has been using Medihoney to the wound bed. He does  not want to use a peg assist insert to help with offloading. He is still not interested in doing a skin substitute. 1/8; patient presents for follow-up. He has been using Medihoney to the wound bed. He has no issues or complaints today. Electronic Signature(s) Signed: 06/04/2022 12:33:43 PM By: Kalman Shan DO Entered By: Kalman Shan on 06/04/2022 11:39:48 -------------------------------------------------------------------------------- Chemical Cauterization Details Patient Name: Date of Service: Tyrone Meigs, [name] 06/04/2022 11:15 A M Medical Record Number: [id number] Patient Account Number: [id number] Date of Birth/Sex: Treating RN: 02-24-1941 (82 y.o. [name] Primary Care Provider: Dimas Chyle Other Clinician: Referring Provider: Treating Provider/Extender: Jessee Avers in Treatment: 22 Procedure Performed for: Wound #1 Left,Plantar Foot Performed By: Physician Kalman Shan, DO Post Procedure Diagnosis Same as Pre-procedure Notes silver nitrate used. Electronic Signature(s) Signed: 06/04/2022 12:33:43 PM By: Kalman Shan DO Signed: 06/04/2022 6:29:44 PM By: Deon Pilling RN, BSN Entered By: Deon Pilling on 06/04/2022 11:38:06 -------------------------------------------------------------------------------- Physical Exam Details Patient Name: Date of Service: Tyrone Meigs, [name] 06/04/2022 11:15 A M Medical Record Number: [id number] Patient Account Number: [id number] Date of Birth/Sex: Treating RN: 07/16/1940 (82 y.o. M) Primary Care Provider: Dimas Chyle Other Clinician: Referring Provider: Treating Provider/Extender: Laurena Slimmer Weeks in Treatment: 22 Constitutional respirations regular, non-labored and within target range for patient.. Cardiovascular 2+ dorsalis pedis/posterior tibialis pulses. Psychiatric pleasant and cooperative. Notes Left foot: T the plantar aspect there is an open wound with hyper  granulated tissue. No signs of surrounding infection. o Electronic Signature(s) Signed: 06/04/2022 12:33:43 PM By: Kalman Shan DO Entered By: Kalman Shan on 06/04/2022 11:40:26 Heffington, [name] ([id number]) [id number] Page 3 of 8 -------------------------------------------------------------------------------- Physician Orders Details Patient Name: Date of Service: [name] 06/04/2022 11:15 A M Medical Record Number: [id number] Patient Account Number: [id number] Date of Birth/Sex: Treating RN: [birth year] (82 y.o. [name] Primary Care Provider: Dimas Chyle Other Clinician: Referring Provider: Treating Provider/Extender: Jessee Avers in Treatment: 22 Verbal / Phone Orders: No Diagnosis Coding ICD-10 Coding Code Description E11.621 Type 2 diabetes mellitus with foot ulcer L97.522 Non-pressure chronic ulcer of other part of left foot with fat layer exposed E11.42 Type 2 diabetes mellitus with diabetic polyneuropathy B51.02 Chronic systolic (congestive) heart failure I48.0 Paroxysmal atrial fibrillation Z89.511 Acquired absence of right leg below knee Follow-up Appointments ppointment in 2 weeks. - w/ Dr. Heber Montgomery (any day) 1230 Monday 06/18/2022 Return A Other: - [employer] sells medihoney you can purchase. Anesthetic (In clinic) Topical Lidocaine 5% applied to wound bed Cellular or Tissue Based Products Cellular or Tissue Based Product Type: - Grafix PL Prime 2x3 #1, #2 02/22/22 Patient refused Grafix now. Bathing/ Shower/ Hygiene May shower and wash wound with soap and water. Edema Control - Lymphedema / SCD / Other Avoid standing for long periods of time. Patient to wear own compression stockings every day. Off-Loading Other: - Use a peg assist insert from clinic (we will cut a hole out for offloading of your wound) Additional Orders / Instructions Follow Nutritious Diet - Monitor/Control Blood  Sugars Wound Treatment Wound #1 - Foot Wound Laterality: Plantar, Left Cleanser: Soap and Water Every Other Day/30 Days Discharge Instructions: May shower and wash wound with dial antibacterial soap and water prior to dressing change. Prim Dressing: MediHoney Gel, tube 1.5 (oz) Every Other Day/30 Days ary  Discharge Instructions: Apply to wound bed as instructed Secondary Dressing: ADAPTIC TOUCH 3x4.25 in Every Other Day/30 Days Discharge Instructions: Apply over primary dressing as directed. Secondary Dressing: Woven Gauze Sponge, Non-Sterile 4x4 in Every Other Day/30 Days Discharge Instructions: Apply over primary dressing as directed. Secured With: Elastic Bandage 4 inch (ACE bandage) Every Other Day/30 Days Discharge Instructions: Secure with ACE bandage as directed. Secured With: [employer], 4.5x3.1 (in/yd) Every Other Day/30 Days Discharge Instructions: Secure with Kerlix as directed. MANSOOR, HILLYARD ([id number]) [id number] Page 4 of 8 Electronic Signature(s) Signed: 06/04/2022 12:33:43 PM By: Kalman Shan DO Entered By: Kalman Shan on 06/04/2022 11:40:36 -------------------------------------------------------------------------------- Problem List Details Patient Name: Date of Service: Tyrone Meigs, [name] 06/04/2022 11:15 A M Medical Record Number: [id number] Patient Account Number: [id number] Date of Birth/Sex: Treating RN: [birth year] (82 y.o. M) Primary Care Provider: Dimas Chyle Other Clinician: Referring Provider: Treating Provider/Extender: Laurena Slimmer Weeks in Treatment: 22 Active Problems ICD-10 Encounter Code Description Active Date MDM Diagnosis E11.621 Type 2 diabetes mellitus with foot ulcer 12/26/2021 No Yes L97.522 Non-pressure chronic ulcer of other part of left foot with fat layer exposed 12/26/2021 No Yes E11.42 Type 2 diabetes mellitus with diabetic polyneuropathy 12/26/2021 No Yes T26.71 Chronic  systolic (congestive) heart failure 12/26/2021 No Yes I48.0 Paroxysmal atrial fibrillation 12/26/2021 No Yes Z89.511 Acquired absence of right leg below knee 12/26/2021 No Yes Inactive Problems Resolved Problems Electronic Signature(s) Signed: 06/04/2022 12:33:43 PM By: Kalman Shan DO Entered By: Kalman Shan on 06/04/2022 11:39:12 -------------------------------------------------------------------------------- Progress Note Details Patient Name: Date of Service: Tyrone Meigs, [name] 06/04/2022 11:15 A M Medical Record Number: [id number] Patient Account Number: [id number] Date of Birth/Sex: Treating RN: [birth year] (82 y.o. M) Primary Care Provider: Dimas Chyle Other Clinician: Referring Provider: Treating Provider/Extender: Woods, Gangemi ([id number]) [id number] Page 5 of 8 Weeks in Treatment: 22 Subjective Chief Complaint Information obtained from Patient 12/26/2021; left foot plantar foot wound History of Present Illness (HPI) 12/26/2021 Mr. Zaylan Kissoon is an 82 year old male with a past medical history of controlled type 2 diabetes, A-fib on Eliquis, chronic systolic heart failure on Entresto and right below knee amputation (2017) that presents to the clinic for a left plantar foot wound. He states this has been present for 1 year and is not sure how it started. He has peripheral neuropathy. He follows with Dr. Sherryle Lis, podiatry for this issue. He had an MRI on 10/16/2021 that did not show evidence of osteomyelitis. He has tried collagen with some benefit. Currently patient is keeping the area covered with a bandaid and no dressing. Due to his right BKA he has issues with balance and uses a cane. He is not a good candidate for the total contact cast. Surgical exostectomy has been thought of for offloading however risk and recovery is high. Patient currently denies signs of infection. 8/15; patient presents for follow-up. He did  not pick up her Granix from the pharmacy. He has been keeping the area covered. We discussed potentially doing a skin substitute and he was interested in this. He denies signs of infection. 8/29; patient presents for follow-up. He has been using Medihoney to the wound bed. He is not able to offload this area due to having a prosthesis. We have not heard back for insurance approval of a skin substitute. 9/12; patient presents for follow-up. We have been using Medihoney to the wound bed. He has no issues or complaints today. He has been  approved for Grafix. We discussed using this at next clinic visit and he was agreeable. 9/21; patient presents for follow-up. He has been using Medihoney to the wound bed. Grafix is available and patient is agreeable to having this placed today. He knows to not get this wet and to return in 1 week. 9/28; this is a patient who has a wound on his left plantar foot for about 2-1/2 to 3 months. Previous BKA on the right which complicates our ability to offload this. We have been using Grafix and we reapplied this today postdebridement 10/5; patient presents for follow-up. He declines having the skin substitute placed again today. He wants to go back to Medihoney. He states he wants to be able to see the wound bed daily. 11/9; patient presents for follow-up. He has been using Medihoney daily to the wound bed. 12/7; patient presents for follow-up. Has been using Medihoney to the wound bed. He has not been using silver alginate. He denies signs of infection. He is still not interested in doing a skin substitute. 12/21; patient presents for follow-up. He has been using Medihoney to the wound bed. He does not want to use a peg assist insert to help with offloading. He is still not interested in doing a skin substitute. 1/8; patient presents for follow-up. He has been using Medihoney to the wound bed. He has no issues or complaints today. Patient History Information obtained  from Patient, Chart. Family History Heart Disease - Siblings, Hypertension - Siblings, Stroke - Father, No family history of Cancer, Diabetes, Hereditary Spherocytosis, Kidney Disease, Lung Disease, Seizures, Thyroid Problems, Tuberculosis. Social History Former smoker - Quit in 1983, Marital Status - Widowed, Alcohol Use - Rarely, Drug Use - No History, Caffeine Use - Rarely. Medical History Cardiovascular Patient has history of Arrhythmia, Congestive Heart Failure, Coronary Artery Disease Endocrine Patient has history of Type II Diabetes Musculoskeletal Patient has history of Osteoarthritis Neurologic Patient has history of Neuropathy Medical A Surgical History Notes nd Genitourinary Hx BPH Oncologic Basal Cell Carcinoma (Face) Objective Constitutional respirations regular, non-labored and within target range for patient.. Vitals Time Taken: 11:11 AM, Height: 72 in, Weight: 198 lbs, BMI: 26.9, Temperature: 97.5 F, Pulse: 69 bpm, Respiratory Rate: 18 breaths/min, Blood Pressure: 101/64 mmHg. GAYLAN, FAUVER ([id number]) [id number] Page 6 of 8 Cardiovascular 2+ dorsalis pedis/posterior tibialis pulses. Psychiatric pleasant and cooperative. General Notes: Left foot: T the plantar aspect there is an open wound with hyper granulated tissue. No signs of surrounding infection. o Integumentary (Hair, Skin) Wound #1 status is Open. Original cause of wound was Gradually Appeared. The date acquired was: 01/24/2021. The wound has been in treatment 22 weeks. The wound is located on the [place]. The wound measures 1.3cm length x 1.5cm width x 0.2cm depth; 1.532cm^2 area and 0.306cm^3 volume. There is Fat Layer (Subcutaneous Tissue) exposed. There is no tunneling or undermining noted. There is a medium amount of serosanguineous drainage noted. The wound margin is well defined and not attached to the wound base. There is large (67-100%) red, hyper -  granulation within the wound bed. There is no necrotic tissue within the wound bed. The periwound skin appearance had no abnormalities noted for moisture. The periwound skin appearance had no abnormalities noted for color. The periwound skin appearance exhibited: Callus. The periwound skin appearance did not exhibit: Crepitus, Excoriation, Induration, Rash, Scarring. Periwound temperature was noted as No Abnormality. Assessment Active Problems ICD-10 Type 2 diabetes mellitus with foot ulcer Non-pressure chronic ulcer of other  part of left foot with fat layer exposed Type 2 diabetes mellitus with diabetic polyneuropathy Chronic systolic (congestive) heart failure Paroxysmal atrial fibrillation Acquired absence of right leg below knee Patient's wound has shown improvement in size and appearance since last clinic visit. I used silver nitrate to the hyper granulated area. I recommended continuing the course with Medihoney and aggressive offloading. Since patient is not able to offload the wound this will likely not heal. He is well aware of this. Procedures Wound #1 Pre-procedure diagnosis of Wound #1 is a Diabetic Wound/Ulcer of the Lower Extremity located on the Left,Plantar Foot . An Chemical Cauterization procedure was performed by Kalman Shan, DO. Post procedure Diagnosis Wound #1: Same as Pre-Procedure Notes: silver nitrate used. Plan Follow-up Appointments: Return Appointment in 2 weeks. - w/ Dr. Heber Orwin (any day) 1230 Monday 06/18/2022 Other: - [employer] sells medihoney you can purchase. Anesthetic: (In clinic) Topical Lidocaine 5% applied to wound bed Cellular or Tissue Based Products: Cellular or Tissue Based Product Type: - Grafix PL Prime 2x3 #1, #2 02/22/22 Patient refused Grafix now. Bathing/ Shower/ Hygiene: May shower and wash wound with soap and water. Edema Control - Lymphedema / SCD / Other: Avoid standing for long periods of time. Patient to wear own  compression stockings every day. Off-Loading: Other: - Use a peg assist insert from clinic (we will cut a hole out for offloading of your wound) Additional Orders / Instructions: Follow Nutritious Diet - Monitor/Control Blood Sugars WOUND #1: - Foot Wound Laterality: Plantar, Left Cleanser: Soap and Water Every Other Day/30 Days Discharge Instructions: May shower and wash wound with dial antibacterial soap and water prior to dressing change. Prim Dressing: MediHoney Gel, tube 1.5 (oz) Every Other Day/30 Days ary Discharge Instructions: Apply to wound bed as instructed Secondary Dressing: ADAPTIC TOUCH 3x4.25 in Every Other Day/30 Days Discharge Instructions: Apply over primary dressing as directed. Secondary Dressing: Woven Gauze Sponge, Non-Sterile 4x4 in Every Other Day/30 Days Discharge Instructions: Apply over primary dressing as directed. Secured With: Elastic Bandage 4 inch (ACE bandage) Every Other Day/30 Days Discharge Instructions: Secure with ACE bandage as directed. Secured With: [employer], 4.5x3.1 (in/yd) Every Other Day/30 Days Discharge Instructions: Secure with Kerlix as directed. CRYSTIAN, FRITH ([id number]) [id number] Page 7 of 8 1. Medihoney 2. Aggressive offloading 3. Follow-up in 2 weeks Electronic Signature(s) Signed: 06/04/2022 12:33:43 PM By: Kalman Shan DO Entered By: Kalman Shan on 06/04/2022 11:45:12 -------------------------------------------------------------------------------- HxROS Details Patient Name: Date of Service: Tyrone Meigs, [name] 06/04/2022 11:15 A M Medical Record Number: [id number] Patient Account Number: [id number] Date of Birth/Sex: Treating RN: 1941-02-05 (82 y.o. M) Primary Care Provider: Dimas Chyle Other Clinician: Referring Provider: Treating Provider/Extender: Jessee Avers in Treatment: 22 Information Obtained From Patient Chart Cardiovascular Medical  History: Positive for: Arrhythmia; Congestive Heart Failure; Coronary Artery Disease Endocrine Medical History: Positive for: Type II Diabetes Treated with: Oral agents Genitourinary Medical History: Past Medical History Notes: Hx BPH Musculoskeletal Medical History: Positive for: Osteoarthritis Neurologic Medical History: Positive for: Neuropathy Oncologic Medical History: Past Medical History Notes: Basal Cell Carcinoma (Face) Immunizations Pneumococcal Vaccine: Received Pneumococcal Vaccination: Yes Received Pneumococcal Vaccination On or After 60th Birthday: Yes Implantable Devices None Family and Social History Cancer: No; Diabetes: No; Heart Disease: Yes - Siblings; Hereditary Spherocytosis: No; Hypertension: Yes - Siblings; Kidney Disease: No; Lung Disease: No; Seizures: No; Stroke: Yes - Father; Thyroid Problems: No; Tuberculosis: No; Former smoker - Quit in 1983; Marital Status -  Widowed; Alcohol Use: TAREQ, DWAN ([id number]) [phone].pdf Page 8 of 8 Rarely; Drug Use: No History; Caffeine Use: Rarely; Financial Concerns: No; Food, Clothing or Shelter Needs: No; Support System Lacking: No; Transportation Concerns: No Electronic Signature(s) Signed: 06/04/2022 12:33:43 PM By: Kalman Shan DO Entered By: Kalman Shan on 06/04/2022 11:40:00 -------------------------------------------------------------------------------- SuperBill Details Patient Name: Date of Service: Tyrone Meigs, [name] 06/04/2022 Medical Record Number: [id number] Patient Account Number: [id number] Date of Birth/Sex: Treating RN: February 05, 1941 (82 y.o. [name] Primary Care Provider: Dimas Chyle Other Clinician: Referring Provider: Treating Provider/Extender: Laurena Slimmer Weeks in Treatment: 22 Diagnosis Coding ICD-10 Codes Code Description E11.621 Type 2 diabetes mellitus with foot ulcer L97.522 Non-pressure chronic ulcer of other part of  left foot with fat layer exposed E11.42 Type 2 diabetes mellitus with diabetic polyneuropathy I35.68 Chronic systolic (congestive) heart failure I48.0 Paroxysmal atrial fibrillation Z89.511 Acquired absence of right leg below knee Facility Procedures : CPT4 Code: [id number] Description: 02111 - CHEM CAUT GRANULATION TISS ICD-10 Diagnosis Description L97.522 Non-pressure chronic ulcer of other part of left foot with fat layer exposed Modifier: Quantity: 1 Physician Procedures : CPT4 Code Description Modifier [id number] - WC PHYS CHEM CAUT GRAN TISSUE ICD-10 Diagnosis Description L97.522 Non-pressure chronic ulcer of other part of left foot with fat layer exposed Quantity: 1 Electronic Signature(s) Signed: 06/04/2022 12:33:43 PM By: Kalman Shan DO Entered By: Kalman Shan on 06/04/2022 11:45:21

## 2022-06-05 NOTE — Progress Notes (Signed)
KASHIF, POOLER ([id number]) [id number] Page 1 of 7 Visit Report for 06/04/2022 Arrival Information Details Patient Name: Date of Service: [name] 06/04/2022 11:15 A M Medical Record Number: [id number] Patient Account Number: [id number] Date of Birth/Sex: Treating RN: Sep 21, 1940 (82 y.o. M) Primary Care Torria Fromer: Dimas Chyle Other Clinician: Referring Dorrian Doggett: Treating Kaiyan Luczak/Extender: Jessee Avers in Treatment: 66 Visit Information History Since Last Visit Added or deleted any medications: No Patient Arrived: Cane Any new allergies or adverse reactions: No Arrival Time: 11:10 Had a fall or experienced change in No Accompanied By: self activities of daily living that may affect Transfer Assistance: None risk of falls: Patient Identification Verified: Yes Signs or symptoms of abuse/neglect since last visito No Secondary Verification Process Completed: Yes Hospitalized since last visit: No Patient Requires Transmission-Based Precautions: No Implantable device outside of the clinic excluding No Patient Has Alerts: Yes cellular tissue based products placed in the center Patient Alerts: Patient on Blood Thinner since last visit: ABI: R=Non Comp Has Dressing in Place as Prescribed: Yes Has Footwear/Offloading in Place as Prescribed: No Left: Wedge Shoe Pain Present Now: No Electronic Signature(s) Signed: 06/04/2022 4:45:41 PM By: Erenest Blank Entered By: Erenest Blank on 06/04/2022 11:11:02 -------------------------------------------------------------------------------- Encounter Discharge Information Details Patient Name: Date of Service: [name], [name] 06/04/2022 11:15 A M Medical Record Number: [id number] Patient Account Number: [id number] Date of Birth/Sex: Treating RN: January 09, 1941 (82 y.o. [name] Primary Care Zen Felling: Dimas Chyle Other Clinician: Referring Adal Sereno: Treating Marlena Barbato/Extender:  Jessee Avers in Treatment: 22 Encounter Discharge Information Items Discharge Condition: Stable Ambulatory Status: Cane Discharge Destination: Home Transportation: Private Auto Accompanied By: self Schedule Follow-up Appointment: Yes Clinical Summary of Care: Electronic Signature(s) Signed: 06/04/2022 6:29:44 PM By: Deon Pilling RN, BSN Entered By: Deon Pilling on 06/04/2022 11:39:42 ACHILLES, NEVILLE ([id number]) [id number] Page 2 of 7 -------------------------------------------------------------------------------- Lower Extremity Assessment Details Patient Name: Date of Service: [name] 06/04/2022 11:15 A M Medical Record Number: [id number] Patient Account Number: [id number] Date of Birth/Sex: Treating RN: Jun 14, 1940 (82 y.o. M) Primary Care Darsh Vandevoort: Dimas Chyle Other Clinician: Referring Blue Ruggerio: Treating Janaki Exley/Extender: Laurena Slimmer Weeks in Treatment: 22 Edema Assessment Assessed: [Left: No] [Right: No] Edema: [Left: Ye] [Right: s] Calf Left: Right: Point of Measurement: 31 cm From Medial Instep 35.7 cm Ankle Left: Right: Point of Measurement: 9 cm From Medial Instep 30 cm Electronic Signature(s) Signed: 06/04/2022 4:45:41 PM By: Erenest Blank Entered By: Erenest Blank on 06/04/2022 11:15:39 -------------------------------------------------------------------------------- Multi Wound Chart Details Patient Name: Date of Service: [name], [name] 06/04/2022 11:15 A M Medical Record Number: [id number] Patient Account Number: [id number] Date of Birth/Sex: Treating RN: [birth year] (82 y.o. M) Primary Care Vannie Hochstetler: Dimas Chyle Other Clinician: Referring Jazae Gandolfi: Treating Briante Loveall/Extender: Jessee Avers in Treatment: 22 Vital Signs Height(in): 72 Pulse(bpm): 69 Weight(lbs): 198 Blood Pressure(mmHg): 101/64 Body Mass Index(BMI): 26.9 Temperature(F):  97.5 Respiratory Rate(breaths/min): 18 [1:Photos:] [N/A:N/A] Left, Plantar Foot N/A N/A Wound Location: Gradually Appeared N/A N/A Wounding Event: Diabetic Wound/Ulcer of the Lower N/A N/A Primary Etiology: Extremity Arrhythmia, Congestive Heart Failure, N/A N/A Comorbid History: Coronary Artery Disease, Type II Diabetes, Osteoarthritis, Neuropathy 01/24/2021 N/A N/A Date Acquired: 31 N/A N/A Weeks of TreatmentREBECCA, MOTTA ([id number]) [id number] Page 3 of 7 Open N/A N/A Wound Status: No N/A N/A Wound Recurrence: 1.3x1.5x0.2 N/A N/A Measurements L x W x D (cm) 1.532 N/A N/A A (cm) :  rea 0.306 N/A N/A Volume (cm) : 4.40% N/A N/A % Reduction in A rea: 36.40% N/A N/A % Reduction in Volume: Grade 1 N/A N/A Classification: Medium N/A N/A Exudate A mount: Serosanguineous N/A N/A Exudate Type: red, brown N/A N/A Exudate Color: Well defined, not attached N/A N/A Wound Margin: Large (67-100%) N/A N/A Granulation A mount: Red, Hyper-granulation N/A N/A Granulation Quality: None Present (0%) N/A N/A Necrotic A mount: Fat Layer (Subcutaneous Tissue): Yes N/A N/A Exposed Structures: Fascia: No Tendon: No Muscle: No Joint: No Bone: No Small (1-33%) N/A N/A Epithelialization: Callus: Yes N/A N/A Periwound Skin Texture: Excoriation: No Induration: No Crepitus: No Rash: No Scarring: No Maceration: No N/A N/A Periwound Skin Moisture: Dry/Scaly: No Atrophie Blanche: No N/A N/A Periwound Skin Color: Cyanosis: No Ecchymosis: No Erythema: No Hemosiderin Staining: No Mottled: No Pallor: No Rubor: No No Abnormality N/A N/A Temperature: Chemical Cauterization N/A N/A Procedures Performed: Treatment Notes Electronic Signature(s) Signed: 06/04/2022 12:33:43 PM By: Kalman Shan DO Entered By: Kalman Shan on 06/04/2022  11:39:18 -------------------------------------------------------------------------------- Multi-Disciplinary Care Plan Details Patient Name: Date of Service: [name], [name] 06/04/2022 11:15 A M Medical Record Number: [id number] Patient Account Number: [id number] Date of Birth/Sex: Treating RN: Aug 13, 1940 (82 y.o. [name] Primary Care Woodward Klem: Dimas Chyle Other Clinician: Referring Aspyn Warnke: Treating Atlee Kluth/Extender: Jessee Avers in Treatment: 22 Active Inactive Nutrition Nursing Diagnoses: Impaired glucose control: actual or potential Goals: Patient/caregiver verbalizes understanding of need to maintain therapeutic glucose control per primary care physician Date Initiated: 03/01/2022 Target Resolution Date: 06/29/2022 Goal Status: Active Interventions: Assess HgA1c results as ordered upon admission and as needed GREYDON, BETKE ([id number]) [id number] Page 4 of 7 Treatment Activities: Obtain HgA1c : 03/01/2022 Notes: Wound/Skin Impairment Nursing Diagnoses: Impaired tissue integrity Goals: Patient/caregiver will verbalize understanding of skin care regimen Date Initiated: 12/26/2021 Date Inactivated: 03/01/2022 Target Resolution Date: 03/23/2022 Unmet Reason: patient refuses Goal Status: Unmet offloading, advance tissue products, and following providers plan of care. Ulcer/skin breakdown will have a volume reduction of 30% by week 4 Date Initiated: 12/26/2021 Date Inactivated: 01/23/2022 Target Resolution Date: 01/23/2022 Goal Status: Met Ulcer/skin breakdown will have a volume reduction of 50% by week 8 Date Initiated: 01/23/2022 Target Resolution Date: 08/24/2022 Goal Status: Active Interventions: Assess patient/caregiver ability to obtain necessary supplies Assess patient/caregiver ability to perform ulcer/skin care regimen upon admission and as needed Assess ulceration(s) every visit Provide education on ulcer  and skin care Treatment Activities: Topical wound management initiated : 12/26/2021 Notes: 01/23/22: Wound care regimen continues. Wound greater than 30% volume reduction, new goal initiated. Pt. not compliant to recommendations/ plan of care prescribed by Dr. Heber Marshall. Electronic Signature(s) Signed: 06/04/2022 6:29:44 PM By: Deon Pilling RN, BSN Entered By: Deon Pilling on 06/04/2022 11:28:27 -------------------------------------------------------------------------------- Pain Assessment Details Patient Name: Date of Service: [name], [name] 06/04/2022 11:15 A M Medical Record Number: [id number] Patient Account Number: [id number] Date of Birth/Sex: Treating RN: February 04, 1941 (82 y.o. M) Primary Care Shaquinta Peruski: Dimas Chyle Other Clinician: Referring Derian Pfost: Treating Kayveon Lennartz/Extender: Laurena Slimmer Weeks in Treatment: 22 Active Problems Location of Pain Severity and Description of Pain Patient Has Paino No Site Locations [place], Ruby E ([id number]) [id number] Page 5 of 7 Pain Management and Medication Current Pain Management: Electronic Signature(s) Signed: 06/04/2022 4:45:41 PM By: Erenest Blank Entered By: Erenest Blank on 06/04/2022 11:11:33 -------------------------------------------------------------------------------- Patient/Caregiver Education Details Patient Name: Date of Service: [name], [name] 1/8/2024andnbsp11:15 A M Medical Record Number: [id number] Patient  Account Number: [id number] Date of Birth/Gender: Treating RN: 1940-08-16 (82 y.o. [name] Primary Care Physician: Dimas Chyle Other Clinician: Referring Physician: Treating Physician/Extender: Jessee Avers in Treatment: 22 Education Assessment Education Provided To: Patient Education Topics Provided Wound/Skin Impairment: Handouts: Caring for Your Ulcer Methods: Explain/Verbal Responses: Reinforcements needed Electronic  Signature(s) Signed: 06/04/2022 6:29:44 PM By: Deon Pilling RN, BSN Entered By: Deon Pilling on 06/04/2022 11:28:43 -------------------------------------------------------------------------------- Wound Assessment Details Patient Name: Date of Service: [name], [name] 06/04/2022 11:15 A M Medical Record Number: [id number] Patient Account Number: [id number] Date of Birth/Sex: Treating RN: 1941-04-01 (82 y.o. M) Primary Care Trevel Dillenbeck: Dimas Chyle Other Clinician: JUWUAN, SEDITA ([id number]) [id number] Page 6 of 7 Referring Sheela Mcculley: Treating Demani Mcbrien/Extender: Jessee Avers in Treatment: 22 Wound Status Wound Number: 1 Primary Diabetic Wound/Ulcer of the Lower Extremity Etiology: Wound Location: Left, Plantar Foot Wound Open Wounding Event: Gradually Appeared Status: Date Acquired: 01/24/2021 Comorbid Arrhythmia, Congestive Heart Failure, Coronary Artery Disease, Weeks Of Treatment: 22 History: Type II Diabetes, Osteoarthritis, Neuropathy Clustered Wound: No Photos Wound Measurements Length: (cm) 1.3 Width: (cm) 1.5 Depth: (cm) 0.2 Area: (cm) 1.532 Volume: (cm) 0.306 % Reduction in Area: 4.4% % Reduction in Volume: 36.4% Epithelialization: Small (1-33%) Tunneling: No Undermining: No Wound Description Classification: Grade 1 Wound Margin: Well defined, not attached Exudate Amount: Medium Exudate Type: Serosanguineous Exudate Color: red, brown Foul Odor After Cleansing: No Slough/Fibrino No Wound Bed Granulation Amount: Large (67-100%) Exposed Structure Granulation Quality: Red, Hyper-granulation Fascia Exposed: No Necrotic Amount: None Present (0%) Fat Layer (Subcutaneous Tissue) Exposed: Yes Tendon Exposed: No Muscle Exposed: No Joint Exposed: No Bone Exposed: No Periwound Skin Texture Texture Color No Abnormalities Noted: No No Abnormalities Noted: Yes Callus: Yes Temperature / Pain Crepitus:  No Temperature: No Abnormality Excoriation: No Induration: No Rash: No Scarring: No Moisture No Abnormalities Noted: Yes Treatment Notes Wound #1 (Foot) Wound Laterality: Plantar, Left Cleanser Soap and Water Discharge Instruction: May shower and wash wound with dial antibacterial soap and water prior to dressing change. Peri-Wound Care Topical Primary Dressing MediHoney Gel, tube 1.5 (oz) CHARVEZ, VOORHIES ([id number]) [id number].pdf Page 7 of 7 Discharge Instruction: Apply to wound bed as instructed Secondary Dressing ADAPTIC TOUCH 3x4.25 in Discharge Instruction: Apply over primary dressing as directed. Woven Gauze Sponge, Non-Sterile 4x4 in Discharge Instruction: Apply over primary dressing as directed. Secured With Elastic Bandage 4 inch (ACE bandage) Discharge Instruction: Secure with ACE bandage as directed. Kerlix Roll Sterile, 4.5x3.1 (in/yd) Discharge Instruction: Secure with Kerlix as directed. Compression Wrap Compression Stockings Add-Ons Electronic Signature(s) Signed: 06/04/2022 4:45:41 PM By: Erenest Blank Entered By: Erenest Blank on 06/04/2022 11:16:50 -------------------------------------------------------------------------------- Vitals Details Patient Name: Date of Service: [name], [name] 06/04/2022 11:15 A M Medical Record Number: [id number] Patient Account Number: [id number] Date of Birth/Sex: Treating RN: 06-03-1940 (82 y.o. M) Primary Care [place] Inthavong: Dimas Chyle Other Clinician: Referring Amylynn Fano: Treating Ethelda Deangelo/Extender: Jessee Avers in Treatment: 22 Vital Signs Time Taken: 11:11 Temperature (F): 97.5 Height (in): 72 Pulse (bpm): 69 Weight (lbs): 198 Respiratory Rate (breaths/min): 18 Body Mass Index (BMI): 26.9 Blood Pressure (mmHg): 101/64 Reference Range: 80 - 120 mg / dl Electronic Signature(s) Signed: 06/04/2022 4:45:41 PM By: Erenest Blank Entered By: Erenest Blank on  06/04/2022 11:11:26

## 2022-06-08 ENCOUNTER — Ambulatory Visit: Payer: Medicare Other | Admitting: Family Medicine

## 2022-06-18 ENCOUNTER — Ambulatory Visit (INDEPENDENT_AMBULATORY_CARE_PROVIDER_SITE_OTHER): Payer: Medicare Other | Admitting: Family

## 2022-06-18 ENCOUNTER — Encounter: Payer: Self-pay | Admitting: Family

## 2022-06-18 VITALS — BP 152/73 | HR 59 | Temp 98.2°F | Ht 72.0 in | Wt 214.1 lb

## 2022-06-18 DIAGNOSIS — R059 Cough, unspecified: Secondary | ICD-10-CM

## 2022-06-18 LAB — POCT INFLUENZA A/B
Influenza A, POC: NEGATIVE
Influenza B, POC: NEGATIVE

## 2022-06-18 LAB — POC COVID19 BINAXNOW: SARS Coronavirus 2 Ag: NEGATIVE

## 2022-06-18 MED ORDER — PROMETHAZINE-DM 6.25-15 MG/5ML PO SYRP: 5.0000 mL | ORAL_SOLUTION | Freq: Three times a day (TID) | ORAL | 0 refills | Status: DC | PRN

## 2022-06-18 NOTE — Progress Notes (Signed)
Patient ID: [name], male    DOB: September 20, 1940, 82 y.o.   MRN: [id number]  Chief Complaint  Patient presents with  . Sinus Problem    Pt c/o cough, runny nose, Chest congestion for about 3 days. Has not been covid tested. Has tried cough drops and mucinex which did help symptoms.     HPI:      URI sx:  sx for 3 days, denies fever, reports taking BP, BS, and temp every am and all have been wnl.  Reports dry cough, mostly at night. Taking Mucinex pills 1-2/day and mucinex lozenges which help some. Denies hx of COPD or bronchitis.  Assessment & Plan:  1. Cough, unspecified type - rapid testing negative. lungs clear. Advised ok to continue Mucinex & lozenges during day, sending cough syrup for night time mainly, ok during day if staying home, advised on SE. Pt requesting printed RX as he is switching pharmacies. Increase fluid intake, use OTC saline nasal spray tid. Overnight humidifier.  - POC COVID-19 - POCT Influenza A/B - promethazine-dextromethorphan (PROMETHAZINE-DM) 6.25-15 MG/5ML syrup; Take 5 mLs by mouth 3 (three) times daily as needed for cough (Can cause drowsiness; Ok to take at bedtime.).  Dispense: 118 mL; Refill: 0   Subjective:    Outpatient Medications Prior to Visit  Medication Sig Dispense Refill  . Accu-Chek Softclix Lancets lancets Use to check blood sugar three times a day. 300 each 4  . acetaminophen (TYLENOL) 500 MG tablet Take 500-1,000 mg by mouth every 6 (six) hours as needed (pain).    Marland Kitchen apixaban (ELIQUIS) 5 MG TABS tablet Take 1 tablet (5 mg total) by mouth 2 (two) times daily. 180 tablet 1  . carvedilol (COREG) 25 MG tablet Take 1 tablet (25 mg total) by mouth 2 (two) times daily. 180 tablet 3  . furosemide (LASIX) 20 MG tablet Take 2 tablets (40 mg total) by mouth daily. 180 tablet 1  . gabapentin (NEURONTIN) 100 MG capsule TAKE 1 CAPSULE BY MOUTH THREE TIMES A DAY Strength: 100 mg (Patient taking differently: 2 (two) times daily. TAKE 1 CAPSULE BY MOUTH  THREE TIMES A DAY Strength: 100 mg) 270 capsule 3  . glucose blood (ACCU-CHEK AVIVA PLUS) test strip Use 3 times daily. Morning, noon, and nightly to check glucose levels Dx: E11.9 (type 2 diabetes mellitus) 100 strip 11  . halobetasol (ULTRAVATE) 0.05 % cream APPLY TO AFFECTED AREA TWICE A DAY 60 g 2  . metFORMIN (GLUCOPHAGE) 1000 MG tablet TAKE 1 TABLET BY MOUTH EVERY DAY WITH BREAKFAST 90 tablet 1  . nystatin cream (MYCOSTATIN) APPLY TO AFFECTED AREA TWICE A DAY (Patient taking differently: Apply 1 application  topically 2 (two) times daily as needed (skin irritation).) 30 g 2  . pantoprazole (PROTONIX) 40 MG tablet TAKE 1 TABLET BY MOUTH EVERY DAY 90 tablet 1  . potassium chloride SA (KLOR-CON M20) 20 MEQ tablet TAKE 2 TABLETS BY MOUTH EVERY MORNING 180 tablet 3  . predniSONE (DELTASONE) 10 MG tablet Take two tablets daily x 1 week, then 1 tablet daily x 1 week. 21 tablet 0  . sacubitril-valsartan (ENTRESTO) 49-51 MG Take 1 tablet by mouth 2 (two) times daily. 180 tablet 1  . sildenafil (VIAGRA) 100 MG tablet TAKE 1 TABLET DAILY AS NEEDED ED 4 tablet 22  . solifenacin (VESICARE) 10 MG tablet TAKE 1 TABLET BY MOUTH EVERY DAY 90 tablet 3  . tamsulosin (FLOMAX) 0.4 MG CAPS capsule TAKE 1 CAPSULE BY MOUTH EVERY DAY  90 capsule 3   No facility-administered medications prior to visit.   Past Medical History:  Diagnosis Date  . CAD (coronary artery disease)    a. Remote cath 2008 for chest pain showed 30% LAD with luminal irregularity and fairly heavy calcification in the mid LAD without critical stenosis, 30% OM1, 30% acute marginal.  . Cancer (HCC)    skin cancer - basal  . Cataract    both  . Chronic combined systolic and diastolic CHF (congestive heart failure) ([place])   . Complication of anesthesia   . Diabetes mellitus    Type II  . ED (erectile dysfunction)   . History of kidney stones    x1  . Hypertension   . NICM (nonischemic cardiomyopathy) ([place])    a. EF 40-45% by echo in 08/2017  - prior low risk stress test in 2017.  . Osteoarthritis   . Paroxysmal SVT (supraventricular tachycardia)   . PONV (postoperative nausea and vomiting) 1960   with Ether  . PVC's (premature ventricular contractions)   . PVD (peripheral vascular disease) ([place])    s/p R BKA  . Rheumatic fever   . Rhinitis   . S/P BKA (below knee amputation), right [facility])    Past Surgical History:  Procedure Laterality Date  . AMPUTATION Right 02/29/2016   Procedure: Right Leg AMPUTATION BELOW KNEE with Wound Vac placement;  Surgeon: [name], MD;  Location: [place];  Service: Orthopedics;  Laterality: Right;  . CARDIOVERSION N/A 06/08/2021   Procedure: CARDIOVERSION;  Surgeon: Jerline Pain, MD;  Location: [facility] ENDOSCOPY;  Service: Cardiovascular;  Laterality: N/A;  . CHOLECYSTECTOMY  1983  . colonoscopy  2006, 2010  . EYE SURGERY Bilateral    cataract  . INGUINAL HERNIA REPAIR Right 1960  . LEFT HEART CATH AND CORONARY ANGIOGRAPHY N/A 06/03/2018   Procedure: LEFT HEART CATH AND CORONARY ANGIOGRAPHY;  Surgeon: Belva Crome, MD;  Location: [place] CV LAB;  Service: Cardiovascular;  Laterality: N/A;  . STUMP REVISION Right 10/30/2017   Procedure: RIGHT BELOW KNEE AMPUTATION REVISION;  Surgeon: [name], MD;  Location: [place];  Service: Orthopedics;  Laterality: Right;   Allergies  Allergen Reactions  . Lisinopril Cough      Objective:    Physical Exam Vitals and nursing note reviewed.  Constitutional:      General: He is not in acute distress.    Appearance: Normal appearance.  HENT:     Head: Normocephalic.     Right Ear: Tympanic membrane and ear canal normal.     Left Ear: Tympanic membrane and ear canal normal.     Nose:     Right Sinus: No maxillary sinus tenderness or frontal sinus tenderness.     Left Sinus: No maxillary sinus tenderness or frontal sinus tenderness.     Mouth/Throat:     Mouth: Mucous membranes are moist.     Pharynx: No pharyngeal swelling, oropharyngeal  exudate, posterior oropharyngeal erythema or uvula swelling.     Tonsils: No tonsillar exudate or tonsillar abscesses.  Cardiovascular:     Rate and Rhythm: Normal rate and regular rhythm.  Pulmonary:     Effort: Pulmonary effort is normal.     Breath sounds: Normal breath sounds.  Musculoskeletal:        General: Normal range of motion.     Cervical back: Normal range of motion.  Lymphadenopathy:     Head:     Right side of head: No preauricular or  posterior auricular adenopathy.     Left side of head: No preauricular or posterior auricular adenopathy.     Cervical: No cervical adenopathy.  Skin:    General: Skin is warm and dry.  Neurological:     Mental Status: He is alert and oriented to person, place, and time.  Psychiatric:        Mood and Affect: Mood normal.   BP (!) 152/73 (BP Location: Left Arm, Patient Position: Sitting, Cuff Size: Large)   Pulse (!) 59   Temp 98.2 F (36.8 C) (Temporal)   Ht 6' (1.829 m)   Wt 214 lb 2 oz (97.1 kg)   SpO2 99%   BMI 29.04 kg/m  Wt Readings from Last 3 Encounters:  06/18/22 214 lb 2 oz (97.1 kg)  02/26/22 206 lb 6.1 oz (93.6 kg)  01/11/22 209 lb (94.8 kg)      Jeanie Sewer, NP

## 2022-06-18 NOTE — Patient Instructions (Addendum)
It was very nice to see you today!   Your flu and covid testing is negative. OK to continue the Mucinex 1-2 times per day.       PLEASE NOTE:  If you had any lab tests please let us know if you have not heard back within a few days. You may see your results on MyChart before we have a chance to review them but we will give you a call once they are reviewed by [place]. If we ordered any referrals today, please let us know if you have not heard from their office within the next week.

## 2022-06-23 DIAGNOSIS — R0789 Other chest pain: Secondary | ICD-10-CM | POA: Diagnosis not present

## 2022-06-23 DIAGNOSIS — E119 Type 2 diabetes mellitus without complications: Secondary | ICD-10-CM | POA: Diagnosis not present

## 2022-06-23 DIAGNOSIS — I2510 Atherosclerotic heart disease of native coronary artery without angina pectoris: Secondary | ICD-10-CM | POA: Diagnosis not present

## 2022-06-23 DIAGNOSIS — R079 Chest pain, unspecified: Secondary | ICD-10-CM | POA: Diagnosis not present

## 2022-06-23 DIAGNOSIS — R001 Bradycardia, unspecified: Secondary | ICD-10-CM | POA: Diagnosis not present

## 2022-06-23 DIAGNOSIS — I4891 Unspecified atrial fibrillation: Secondary | ICD-10-CM | POA: Diagnosis not present

## 2022-06-23 DIAGNOSIS — I10 Essential (primary) hypertension: Secondary | ICD-10-CM | POA: Diagnosis not present

## 2022-06-25 ENCOUNTER — Encounter (HOSPITAL_BASED_OUTPATIENT_CLINIC_OR_DEPARTMENT_OTHER): Payer: Medicare Other | Admitting: Internal Medicine

## 2022-06-25 DIAGNOSIS — N528 Other male erectile dysfunction: Secondary | ICD-10-CM | POA: Diagnosis not present

## 2022-06-25 DIAGNOSIS — L97522 Non-pressure chronic ulcer of other part of left foot with fat layer exposed: Secondary | ICD-10-CM

## 2022-06-25 DIAGNOSIS — E1142 Type 2 diabetes mellitus with diabetic polyneuropathy: Secondary | ICD-10-CM

## 2022-06-25 DIAGNOSIS — R311 Benign essential microscopic hematuria: Secondary | ICD-10-CM | POA: Diagnosis not present

## 2022-06-25 DIAGNOSIS — R3915 Urgency of urination: Secondary | ICD-10-CM | POA: Diagnosis not present

## 2022-06-25 DIAGNOSIS — Z89511 Acquired absence of right leg below knee: Secondary | ICD-10-CM | POA: Diagnosis not present

## 2022-06-25 DIAGNOSIS — Z7901 Long term (current) use of anticoagulants: Secondary | ICD-10-CM | POA: Diagnosis not present

## 2022-06-25 DIAGNOSIS — E11621 Type 2 diabetes mellitus with foot ulcer: Secondary | ICD-10-CM | POA: Diagnosis not present

## 2022-06-25 DIAGNOSIS — R3129 Other microscopic hematuria: Secondary | ICD-10-CM | POA: Diagnosis not present

## 2022-06-25 DIAGNOSIS — I5022 Chronic systolic (congestive) heart failure: Secondary | ICD-10-CM | POA: Diagnosis not present

## 2022-06-25 NOTE — Progress Notes (Signed)
Tyrone, Roberts ([id number]) [id number] Page 1 of 8 Visit Report for 06/25/2022 Chief Complaint Document Details Patient Name: Date of Service: [name] 06/25/2022 12:30 PM Medical Record Number: [id number] Patient Account Number: [id number] Date of Birth/Sex: Treating RN: March 25, 1941 (82 y.o. M) Primary Care Provider: Dimas Chyle Other Clinician: Referring Provider: Treating Provider/Extender: Laurena Slimmer Weeks in Treatment: 25 Information Obtained from: Patient Chief Complaint 12/26/2021; left foot plantar foot wound Electronic Signature(s) Signed: 06/25/2022 11:13:35 AM By: Kalman Shan DO Entered By: Kalman Shan on 06/25/2022 11:00:20 -------------------------------------------------------------------------------- HPI Details Patient Name: Date of Service: [name], Tyrone [name] 06/25/2022 12:30 PM Medical Record Number: [id number] Patient Account Number: [id number] Date of Birth/Sex: Treating RN: 11/20/1940 (82 y.o. M) Primary Care Provider: Dimas Chyle Other Clinician: Referring Provider: Treating Provider/Extender: Jessee Avers in Treatment: 25 History of Present Illness HPI Description: 12/26/2021 [name] is an 82 year old male with a past medical history of controlled type 2 diabetes, A-fib on Eliquis, chronic systolic heart failure on Entresto and right below knee amputation (2017) that presents to the clinic for a left plantar foot wound. He states this has been present for 1 year and is not sure how it started. He has peripheral neuropathy. He follows with Dr. Sherryle Lis, podiatry for this issue. He had an MRI on 10/16/2021 that did not show evidence of osteomyelitis. He has tried collagen with some benefit. Currently patient is keeping the area covered with a bandaid and no dressing. Due to his right BKA he has issues with balance and uses a cane. He is not a good candidate for the total  contact cast. Surgical exostectomy has been thought of for offloading however risk and recovery is high. Patient currently denies signs of infection. 8/15; patient presents for follow-up. He did not pick up her Granix from the pharmacy. He has been keeping the area covered. We discussed potentially doing a skin substitute and he was interested in this. He denies signs of infection. 8/29; patient presents for follow-up. He has been using Medihoney to the wound bed. He is not able to offload this area due to having a prosthesis. We have not heard back for insurance approval of a skin substitute. 9/12; patient presents for follow-up. We have been using Medihoney to the wound bed. He has no issues or complaints today. He has been approved for Grafix. We discussed using this at next clinic visit and he was agreeable. 9/21; patient presents for follow-up. He has been using Medihoney to the wound bed. Grafix is available and patient is agreeable to having this placed today. He knows to not get this wet and to return in 1 week. 9/28; this is a patient who has a wound on his left plantar foot for about 2-1/2 to 3 months. Previous BKA on the right which complicates our ability to offload this. We have been using Grafix and we reapplied this today postdebridement 10/5; patient presents for follow-up. He declines having the skin substitute placed again today. He wants to go back to Medihoney. He states he wants to be able to see the wound bed daily. 11/9; patient presents for follow-up. He has been using Medihoney daily to the wound bed. 12/7; patient presents for follow-up. Has been using Medihoney to the wound bed. He has not been using silver alginate. He denies signs of infection. He is still not interested in doing a skin substitute. [name] ([id number]) [id number] Page 2 of 8  12/21; patient presents for follow-up. He has been using Medihoney to the wound bed. He does  not want to use a peg assist insert to help with offloading. He is still not interested in doing a skin substitute. 1/8; patient presents for follow-up. He has been using Medihoney to the wound bed. He has no issues or complaints today. 1/29; patient presents for follow-up. He been using Medihoney to the wound bed. He has no issues or complaints today. Electronic Signature(s) Signed: 06/25/2022 11:13:35 AM By: Kalman Shan DO Entered By: Kalman Shan on 06/25/2022 11:00:45 -------------------------------------------------------------------------------- Physical Exam Details Patient Name: Date of Service: [name], Tyrone [name] 06/25/2022 12:30 PM Medical Record Number: [id number] Patient Account Number: [id number] Date of Birth/Sex: Treating RN: 1941-03-23 (82 y.o. M) Primary Care Provider: Dimas Chyle Other Clinician: Referring Provider: Treating Provider/Extender: Laurena Slimmer Weeks in Treatment: 25 Constitutional respirations regular, non-labored and within target range for patient.. Cardiovascular 2+ dorsalis pedis/posterior tibialis pulses. Psychiatric pleasant and cooperative. Notes Left foot: T the plantar aspect there is an open wound with granulated tissue. Minimal undermining from the 12 to 6 o'clock position. No signs of surrounding o infection. Electronic Signature(s) Signed: 06/25/2022 11:13:35 AM By: Kalman Shan DO Entered By: Kalman Shan on 06/25/2022 11:01:20 -------------------------------------------------------------------------------- Physician Orders Details Patient Name: Date of Service: [name], Tyrone [name] 06/25/2022 12:30 PM Medical Record Number: [id number] Patient Account Number: [id number] Date of Birth/Sex: Treating RN: [birth year] (82 y.o. [name] Primary Care Provider: Dimas Chyle Other Clinician: Referring Provider: Treating Provider/Extender: Jessee Avers in Treatment: 25 Verbal  / Phone Orders: No Diagnosis Coding Follow-up Appointments Return appointment in 1 month. - w/ Dr. Heber [place] and Elias Else # 9 Other: - [place] sells medihoney you can purchase. ***Follow up with your podiatrist (foot doctor)**** Anesthetic (In clinic) Topical Lidocaine 5% applied to wound bed Cellular or Tissue [address] DONTAVIAN, MARCHI ([id number]) [id number] Page 3 of 8 Cellular or Tissue Based Product Type: - Grafix PL Prime 2x3 #1, #2 02/22/22 Patient refused Grafix now. Bathing/ Shower/ Hygiene May shower and wash wound with soap and water. Edema Control - Lymphedema / SCD / Other Avoid standing for long periods of time. Patient to wear own compression stockings every day. Off-Loading Other: - Use a peg assist insert from clinic (we will cut a hole out for offloading of your wound) Additional Orders / Instructions Follow Nutritious Diet - Monitor/Control Blood Sugars Wound Treatment Wound #1 - Foot Wound Laterality: Plantar, Left Cleanser: Soap and Water Every Other Day/30 Days Discharge Instructions: May shower and wash wound with dial antibacterial soap and water prior to dressing change. Prim Dressing: MediHoney Gel, tube 1.5 (oz) Every Other Day/30 Days ary Discharge Instructions: Apply to wound bed as instructed Secondary Dressing: ADAPTIC TOUCH 3x4.25 in Every Other Day/30 Days Discharge Instructions: Apply over primary dressing as directed. Secondary Dressing: Woven Gauze Sponge, Non-Sterile 4x4 in Every Other Day/30 Days Discharge Instructions: Apply over primary dressing as directed. Secondary Dressing: Zetuvit Plus Silicone Border Dressing 4x4 (in/in) Every Other Day/30 Days Discharge Instructions: Apply silicone border over primary dressing as directed. Electronic Signature(s) Signed: 06/25/2022 11:13:35 AM By: Kalman Shan DO Entered By: Kalman Shan on 06/25/2022  11:01:29 -------------------------------------------------------------------------------- Problem List Details Patient Name: Date of Service: [name], Tyrone [name] 06/25/2022 12:30 PM Medical Record Number: [id number] Patient Account Number: [id number] Date of Birth/Sex: Treating RN: 1940/09/05 (82 y.o. M) Primary Care Provider: Dimas Chyle Other Clinician:  Referring Provider: Treating Provider/Extender: Jessee Avers in Treatment: 25 Active Problems ICD-10 Encounter Code Description Active Date MDM Diagnosis E11.621 Type 2 diabetes mellitus with foot ulcer 12/26/2021 No Yes L97.522 Non-pressure chronic ulcer of other part of left foot with fat layer exposed 12/26/2021 No Yes E11.42 Type 2 diabetes mellitus with diabetic polyneuropathy 12/26/2021 No Yes D98.33 Chronic systolic (congestive) heart failure 12/26/2021 No Yes [name], [name] ([id number]) [id number] Page 4 of 8 I48.0 Paroxysmal atrial fibrillation 12/26/2021 No Yes Z89.511 Acquired absence of right leg below knee 12/26/2021 No Yes Inactive Problems Resolved Problems Electronic Signature(s) Signed: 06/25/2022 11:13:35 AM By: Kalman Shan DO Entered By: Kalman Shan on 06/25/2022 11:00:08 -------------------------------------------------------------------------------- Progress Note Details Patient Name: Date of Service: [name], Tyrone [name] 06/25/2022 12:30 PM Medical Record Number: [id number] Patient Account Number: [id number] Date of Birth/Sex: Treating RN: [birth year] (82 y.o. M) Primary Care Provider: Dimas Chyle Other Clinician: Referring Provider: Treating Provider/Extender: Jessee Avers in Treatment: 25 Subjective Chief Complaint Information obtained from Patient 12/26/2021; left foot plantar foot wound History of Present Illness (HPI) 12/26/2021 [name] is an 82 year old male with a past medical history of controlled type 2 diabetes,  A-fib on Eliquis, chronic systolic heart failure on Entresto and right below knee amputation (2017) that presents to the clinic for a left plantar foot wound. He states this has been present for 1 year and is not sure how it started. He has peripheral neuropathy. He follows with Dr. Sherryle Lis, podiatry for this issue. He had an MRI on 10/16/2021 that did not show evidence of osteomyelitis. He has tried collagen with some benefit. Currently patient is keeping the area covered with a bandaid and no dressing. Due to his right BKA he has issues with balance and uses a cane. He is not a good candidate for the total contact cast. Surgical exostectomy has been thought of for offloading however risk and recovery is high. Patient currently denies signs of infection. 8/15; patient presents for follow-up. He did not pick up her Granix from the pharmacy. He has been keeping the area covered. We discussed potentially doing a skin substitute and he was interested in this. He denies signs of infection. 8/29; patient presents for follow-up. He has been using Medihoney to the wound bed. He is not able to offload this area due to having a prosthesis. We have not heard back for insurance approval of a skin substitute. 9/12; patient presents for follow-up. We have been using Medihoney to the wound bed. He has no issues or complaints today. He has been approved for Grafix. We discussed using this at next clinic visit and he was agreeable. 9/21; patient presents for follow-up. He has been using Medihoney to the wound bed. Grafix is available and patient is agreeable to having this placed today. He knows to not get this wet and to return in 1 week. 9/28; this is a patient who has a wound on his left plantar foot for about 2-1/2 to 3 months. Previous BKA on the right which complicates our ability to offload this. We have been using Grafix and we reapplied this today postdebridement 10/5; patient presents for follow-up. He  declines having the skin substitute placed again today. He wants to go back to Medihoney. He states he wants to be able to see the wound bed daily. 11/9; patient presents for follow-up. He has been using Medihoney daily to the wound bed. 12/7; patient presents for follow-up. Has been using  Medihoney to the wound bed. He has not been using silver alginate. He denies signs of infection. He is still not interested in doing a skin substitute. 12/21; patient presents for follow-up. He has been using Medihoney to the wound bed. He does not want to use a peg assist insert to help with offloading. He is still not interested in doing a skin substitute. 1/8; patient presents for follow-up. He has been using Medihoney to the wound bed. He has no issues or complaints today. 1/29; patient presents for follow-up. He been using Medihoney to the wound bed. He has no issues or complaints today. [name], [name] ([id number]) [id number] Page 5 of 8 Patient History Information obtained from Patient, Chart. Family History Heart Disease - Siblings, Hypertension - Siblings, Stroke - Father, No family history of Cancer, Diabetes, Hereditary Spherocytosis, Kidney Disease, Lung Disease, Seizures, Thyroid Problems, Tuberculosis. Social History Former smoker - Quit in 1983, Marital Status - Widowed, Alcohol Use - Rarely, Drug Use - No History, Caffeine Use - Rarely. Medical History Cardiovascular Patient has history of Arrhythmia, Congestive Heart Failure, Coronary Artery Disease Endocrine Patient has history of Type II Diabetes Musculoskeletal Patient has history of Osteoarthritis Neurologic Patient has history of Neuropathy Medical A Surgical History Notes nd Genitourinary Hx BPH Oncologic Basal Cell Carcinoma (Face) Objective Constitutional respirations regular, non-labored and within target range for patient.. Vitals Time Taken: 10:27 AM, Height: 72 in, Weight: 198 lbs, BMI:  26.9, Temperature: 98.7 F, Pulse: 79 bpm, Respiratory Rate: 17 breaths/[name], Blood Pressure: 103/68 mmHg. Cardiovascular 2+ dorsalis pedis/posterior tibialis pulses. Psychiatric pleasant and cooperative. General Notes: Left foot: T the plantar aspect there is an open wound with granulated tissue. Minimal undermining from the 12 to 6 o'clock position. No signs o of surrounding infection. Integumentary (Hair, Skin) Wound #1 status is Open. Original cause of wound was Gradually Appeared. The date acquired was: 01/24/2021. The wound has been in treatment 25 weeks. The wound is located on the [place]. The wound measures 1.4cm length x 1.5cm width x 0.2cm depth; 1.649cm^2 area and 0.33cm^3 volume. There is Fat Layer (Subcutaneous Tissue) exposed. There is no tunneling noted, however, there is undermining starting at 10:00 and ending at 2:00 with a maximum distance of 0.2cm. There is a medium amount of serosanguineous drainage noted. The wound margin is well defined and not attached to the wound base. There is large (67-100%) red, hyper - granulation within the wound bed. There is no necrotic tissue within the wound bed. The periwound skin appearance had no abnormalities noted for moisture. The periwound skin appearance had no abnormalities noted for color. The periwound skin appearance exhibited: Callus. The periwound skin appearance did not exhibit: Crepitus, Excoriation, Induration, Rash, Scarring. Periwound temperature was noted as No Abnormality. Assessment Active Problems ICD-10 Type 2 diabetes mellitus with foot ulcer Non-pressure chronic ulcer of other part of left foot with fat layer exposed Type 2 diabetes mellitus with diabetic polyneuropathy Chronic systolic (congestive) heart failure Paroxysmal atrial fibrillation Acquired absence of right leg below knee Patient's wound is stable. He is not able to aggressively offload this area. We have tried several wound healing  modalities including Regranix, skin substitute, Medihoney and silver alginate. I do not believe patient ever picked up ReGranix from the pharmacy. He tried Grafix, skin substitute for 2 weeks but then declined having this replaced despite significant improvement in wound healing. For offloading he cannot be placed in a total contact cast. He declined using peg assist. He also has an obvious  deformity to the plantar aspect of the foot that is contributing to keeping his wound open when he puts pressure on the area. I recommended he follow back up with podiatry to see if there are surgical options to help close this wound. He may follow up in one month in our clinic if not able to see podiatry in the next few weeks. [name], [name] ([id number]) [id number] Page 6 of 8 Plan Follow-up Appointments: Return appointment in 1 month. - w/ Dr. Heber [place] and Elias Else # 9 Other: - [place] sells medihoney you can purchase. ***Follow up with your podiatrist (foot doctor)**** Anesthetic: (In clinic) Topical Lidocaine 5% applied to wound bed Cellular or Tissue Based Products: Cellular or Tissue Based Product Type: - Grafix PL Prime 2x3 #1, #2 02/22/22 Patient refused Grafix now. Bathing/ Shower/ Hygiene: May shower and wash wound with soap and water. Edema Control - Lymphedema / SCD / Other: Avoid standing for long periods of time. Patient to wear own compression stockings every day. Off-Loading: Other: - Use a peg assist insert from clinic (we will cut a hole out for offloading of your wound) Additional Orders / Instructions: Follow Nutritious Diet - Monitor/Control Blood Sugars WOUND #1: - Foot Wound Laterality: Plantar, Left Cleanser: Soap and Water Every Other Day/30 Days Discharge Instructions: May shower and wash wound with dial antibacterial soap and water prior to dressing change. Prim Dressing: MediHoney Gel, tube 1.5 (oz) Every Other Day/30 Days ary Discharge  Instructions: Apply to wound bed as instructed Secondary Dressing: ADAPTIC TOUCH 3x4.25 in Every Other Day/30 Days Discharge Instructions: Apply over primary dressing as directed. Secondary Dressing: Woven Gauze Sponge, Non-Sterile 4x4 in Every Other Day/30 Days Discharge Instructions: Apply over primary dressing as directed. Secondary Dressing: Zetuvit Plus Silicone Border Dressing 4x4 (in/in) Every Other Day/30 Days Discharge Instructions: Apply silicone border over primary dressing as directed. 1. Follow-up with podiatry 2. Return in 1 month if unable to obtain appointment with podiatry 3. Can continue Medihoney Electronic Signature(s) Signed: 06/25/2022 11:13:35 AM By: Kalman Shan DO Entered By: Kalman Shan on 06/25/2022 11:10:01 -------------------------------------------------------------------------------- HxROS Details Patient Name: Date of Service: [name], Tyrone [name] 06/25/2022 12:30 PM Medical Record Number: [id number] Patient Account Number: [id number] Date of Birth/Sex: Treating RN: 07/16/1940 (82 y.o. M) Primary Care Provider: Dimas Chyle Other Clinician: Referring Provider: Treating Provider/Extender: Jessee Avers in Treatment: 25 Information Obtained From Patient Chart Cardiovascular Medical History: Positive for: Arrhythmia; Congestive Heart Failure; Coronary Artery Disease Endocrine Medical History: Positive for: Type II Diabetes Treated with: Oral agents Genitourinary Medical History: Past Medical History Notes: Hx BPH [name], [name] ([id number]) [id number] Page 7 of 8 Musculoskeletal Medical History: Positive for: Osteoarthritis Neurologic Medical History: Positive for: Neuropathy Oncologic Medical History: Past Medical History Notes: Basal Cell Carcinoma (Face) Immunizations Pneumococcal Vaccine: Received Pneumococcal Vaccination: Yes Received Pneumococcal Vaccination On or After 60th  Birthday: Yes Implantable Devices None Family and Social History Cancer: No; Diabetes: No; Heart Disease: Yes - Siblings; Hereditary Spherocytosis: No; Hypertension: Yes - Siblings; Kidney Disease: No; Lung Disease: No; Seizures: No; Stroke: Yes - Father; Thyroid Problems: No; Tuberculosis: No; Former smoker - Quit in 1983; Marital Status - Widowed; Alcohol Use: Rarely; Drug Use: No History; Caffeine Use: Rarely; Financial Concerns: No; Food, Clothing or Shelter Needs: No; Support System Lacking: No; Transportation Concerns: No Electronic Signature(s) Signed: 06/25/2022 11:13:35 AM By: Kalman Shan DO Entered By: Kalman Shan on 06/25/2022 11:00:50 -------------------------------------------------------------------------------- SuperBill Details Patient Name: Date of Service:  [name], Tyrone [name] 06/25/2022 Medical Record Number: [id number] Patient Account Number: [id number] Date of Birth/Sex: Treating RN: 07/24/1940 (82 y.o. [name], Lauren Primary Care Provider: Dimas Chyle Other Clinician: Referring Provider: Treating Provider/Extender: Laurena Slimmer Weeks in Treatment: 25 Diagnosis Coding ICD-10 Codes Code Description E11.621 Type 2 diabetes mellitus with foot ulcer L97.522 Non-pressure chronic ulcer of other part of left foot with fat layer exposed E11.42 Type 2 diabetes mellitus with diabetic polyneuropathy W29.56 Chronic systolic (congestive) heart failure I48.0 Paroxysmal atrial fibrillation Z89.511 Acquired absence of right leg below knee Facility Procedures : CPT4 Code: [id number] Description: 99213 - WOUND CARE VISIT-LEV 3 EST PT Modifier: Quantity: 1 Physician Procedures : CPT4 Code Description Modifier [id number] - WC PHYS LEVEL 3 - EST PT ICD-10 Diagnosis Description E11.621 Type 2 diabetes mellitus with foot ulcer [name], [name] E ([id number]) [id number] E11.621 Type 2 diabetes mellitus with foot  ulcer L97.522  Non-pressure chronic ulcer of other part of left foot with fat layer exposed E11.42 Type 2 diabetes mellitus with diabetic polyneuropathy Z89.511 Acquired absence of right leg below knee Quantity: 1 [id number] Page 8 of 8 Electronic Signature(s) Signed: 06/25/2022 11:13:35 AM By: Kalman Shan DO Entered By: Kalman Shan on 06/25/2022 11:10:17

## 2022-06-27 NOTE — Progress Notes (Addendum)
Tyrone, Roberts ([id number]) [id number] Page 1 of 9 Visit Report for 06/25/2022 Arrival Information Details Patient Name: Date of Service: [name] 06/25/2022 12:30 PM Medical Record Number: [id number] Patient Account Number: [id number] Date of Birth/Sex: Treating RN: Dec 17, 1940 (82 y.o. [name], Lauren Primary Care Ianna Salmela: Dimas Chyle Other Clinician: Referring Dolce Sylvia: Treating Julio Zappia/Extender: Jessee Avers in Treatment: 25 Visit Information History Since Last Visit Added or deleted any medications: No Patient Arrived: [name] Any new allergies or adverse reactions: No Arrival Time: 10:26 Had a fall or experienced change in No Accompanied By: self activities of daily living that may affect Transfer Assistance: None risk of falls: Patient Identification Verified: Yes Signs or symptoms of abuse/neglect since last visito No Secondary Verification Process Completed: Yes Hospitalized since last visit: No Patient Requires Transmission-Based Precautions: No Implantable device outside of the clinic excluding No Patient Has Alerts: Yes cellular tissue based products placed in the center Patient Alerts: Patient on Blood Thinner since last visit: ABI: R=Non Comp Has Dressing in Place as Prescribed: Yes Pain Present Now: No Electronic Signature(s) Signed: 06/27/2022 8:39:24 AM By: Rhae Hammock RN Entered By: Rhae Hammock on 06/25/2022 10:26:45 -------------------------------------------------------------------------------- Clinic Level of Care Assessment Details Patient Name: Date of Service: Tyrone Beck [name] 06/25/2022 12:30 PM Medical Record Number: [id number] Patient Account Number: [id number] Date of Birth/Sex: Treating RN: 01/10/1941 (82 y.o. [name] Primary Care Khilynn Borntreger: Dimas Chyle Other Clinician: Referring Jacelyn Cuen: Treating Codi Kertz/Extender: Jessee Avers in  Treatment: 25 Clinic Level of Care Assessment Items TOOL 4 Quantity Score X- 1 0 Use when only an EandM is performed on FOLLOW-UP visit ASSESSMENTS - Nursing Assessment / Reassessment X- 1 10 Reassessment of Co-morbidities (includes updates in patient status) X- 1 5 Reassessment of Adherence to Treatment Plan ASSESSMENTS - Wound and Skin A ssessment / Reassessment X - Simple Wound Assessment / Reassessment - one wound 1 5 []$  - 0 Complex Wound Assessment / Reassessment - multiple wounds []$  - 0 Dermatologic / Skin Assessment (not related to wound area) ASSESSMENTS - Focused Assessment X- 1 5 Circumferential Edema Measurements - multi extremities []$  - 0 Nutritional Assessment / Counseling / Intervention RAYVON, CURLEY ([id number]) [id number] Page 2 of 9 []$  - 0 Lower Extremity Assessment (monofilament, tuning fork, pulses) []$  - 0 Peripheral Arterial Disease Assessment (using hand held doppler) ASSESSMENTS - Ostomy and/or Continence Assessment and Care []$  - 0 Incontinence Assessment and Management []$  - 0 Ostomy Care Assessment and Management (repouching, etc.) PROCESS - Coordination of Care X - Simple Patient / Family Education for ongoing care 1 15 []$  - 0 Complex (extensive) Patient / Family Education for ongoing care X- 1 10 Staff obtains Programmer, systems, Records, T Results / Process Orders est []$  - 0 Staff telephones HHA, Nursing Homes / Clarify orders / etc []$  - 0 Routine Transfer to another Facility (non-emergent condition) []$  - 0 Routine Hospital Admission (non-emergent condition) []$  - 0 New Admissions / Biomedical engineer / Ordering NPWT Apligraf, etc. , []$  - 0 Emergency Hospital Admission (emergent condition) X- 1 10 Simple Discharge Coordination []$  - 0 Complex (extensive) Discharge Coordination PROCESS - Special Needs []$  - 0 Pediatric / Minor Patient Management []$  - 0 Isolation Patient Management []$  - 0 Hearing / Language /  Visual special needs []$  - 0 Assessment of Community assistance (transportation, D/C planning, etc.) []$  - 0 Additional assistance / Altered mentation []$  - 0 Support Surface(s) Assessment (bed, cushion, seat,  etc.) INTERVENTIONS - Wound Cleansing / Measurement X - Simple Wound Cleansing - one wound 1 5 []$  - 0 Complex Wound Cleansing - multiple wounds X- 1 5 Wound Imaging (photographs - any number of wounds) []$  - 0 Wound Tracing (instead of photographs) X- 1 5 Simple Wound Measurement - one wound []$  - 0 Complex Wound Measurement - multiple wounds INTERVENTIONS - Wound Dressings X - Small Wound Dressing one or multiple wounds 1 10 []$  - 0 Medium Wound Dressing one or multiple wounds []$  - 0 Large Wound Dressing one or multiple wounds X- 1 5 Application of Medications - topical []$  - 0 Application of Medications - injection INTERVENTIONS - Miscellaneous []$  - 0 External ear exam []$  - 0 Specimen Collection (cultures, biopsies, blood, body fluids, etc.) []$  - 0 Specimen(s) / Culture(s) sent or taken to Lab for analysis []$  - 0 Patient Transfer (multiple staff / Civil Service fast streamer / Similar devices) []$  - 0 Simple Staple / Suture removal (25 or less) []$  - 0 Complex Staple / Suture removal (26 or more) []$  - 0 Hypo / Hyperglycemic Management (close monitor of Blood Glucose) RICKO, MACDONELL E ([id number]) [id number] Page 3 of 9 []$  - 0 Ankle / Brachial Index (ABI) - do not check if billed separately X- 1 5 Vital Signs Has the patient been seen at the hospital within the last three years: Yes Total Score: 95 Level Of Care: New/Established - Level 3 Electronic Signature(s) Signed: 06/27/2022 8:39:24 AM By: Rhae Hammock RN Entered By: Rhae Hammock on 06/25/2022 10:42:54 -------------------------------------------------------------------------------- Encounter Discharge Information Details Patient Name: Date of Service: [name], [name] 06/25/2022 12:30  PM Medical Record Number: [id number] Patient Account Number: [id number] Date of Birth/Sex: Treating RN: [birth year] (82 y.o. [name], Lauren Primary Care Inas Avena: Dimas Chyle Other Clinician: Referring Tafari Humiston: Treating Giavanni Zeitlin/Extender: Jessee Avers in Treatment: 25 Encounter Discharge Information Items Discharge Condition: Stable Ambulatory Status: Cane Discharge Destination: Home Transportation: Private Auto Accompanied By: self Schedule Follow-up Appointment: Yes Clinical Summary of Care: Patient Declined Electronic Signature(s) Signed: 06/27/2022 8:39:24 AM By: Rhae Hammock RN Entered By: Rhae Hammock on 06/25/2022 10:43:25 -------------------------------------------------------------------------------- Lower Extremity Assessment Details Patient Name: Date of Service: [name], [name] 06/25/2022 12:30 PM Medical Record Number: [id number] Patient Account Number: [id number] Date of Birth/Sex: Treating RN: [birth year] (82 y.o. [name], Lauren Primary Care Kesley Mullens: Dimas Chyle Other Clinician: Referring Seyon Strader: Treating Dequita Schleicher/Extender: Laurena Slimmer Weeks in Treatment: 25 Edema Assessment Assessed: [Left: Yes] [Right: No] Edema: [Left: Ye] [Right: s] Calf Left: Right: Point of Measurement: 31 cm From Medial Instep 35.7 cm Ankle Left: Right: Point of Measurement: 9 cm From Medial Instep 30 cm Vascular Assessment KELWIN, FARAONE E ([id number]) [Right:[id number] Page 4 of 9] Pulses: Dorsalis Pedis Palpable: [Left:Yes] Posterior Tibial Palpable: [Left:Yes] Electronic Signature(s) Signed: 06/27/2022 8:39:24 AM By: Rhae Hammock RN Entered By: Rhae Hammock on 06/25/2022 10:30:26 -------------------------------------------------------------------------------- Multi Wound Chart Details Patient Name: Date of Service: [name], [name] 06/25/2022 12:30 PM Medical Record  Number: [id number] Patient Account Number: [id number] Date of Birth/Sex: Treating RN: 11/27/1940 (82 y.o. M) Primary Care Zairah Arista: Dimas Chyle Other Clinician: Referring Jontavious Commons: Treating Ona Rathert/Extender: Jessee Avers in Treatment: 25 Vital Signs Height(in): 72 Pulse(bpm): 79 Weight(lbs): 198 Blood Pressure(mmHg): 103/68 Body Mass Index(BMI): 26.9 Temperature(F): 98.7 Respiratory Rate(breaths/min): 17 [1:Photos:] [N/A:N/A] Left, Plantar Foot N/A N/A Wound Location: Gradually Appeared N/A N/A Wounding Event: Diabetic Wound/Ulcer of the Lower N/A N/A Primary Etiology:  Extremity Arrhythmia, Congestive Heart Failure, N/A N/A Comorbid History: Coronary Artery Disease, Type II Diabetes, Osteoarthritis, Neuropathy 01/24/2021 N/A N/A Date Acquired: 25 N/A N/A Weeks of Treatment: Open N/A N/A Wound Status: No N/A N/A Wound Recurrence: 1.4x1.5x0.2 N/A N/A Measurements L x W x D (cm) 1.649 N/A N/A A (cm) : rea 0.33 N/A N/A Volume (cm) : -2.90% N/A N/A % Reduction in A rea: 31.40% N/A N/A % Reduction in Volume: 10 Starting Position 1 (o'clock): 2 Ending Position 1 (o'clock): 0.2 Maximum Distance 1 (cm): Yes N/A N/A Undermining: Grade 1 N/A N/A Classification: Medium N/A N/A Exudate A mount: Serosanguineous N/A N/A Exudate Type: red, brown N/A N/A Exudate Color: Well defined, not attached N/A N/A Wound Margin: Large (67-100%) N/A N/A Granulation A mount: Red, Hyper-granulation N/A N/A Granulation Quality: None Present (0%) N/A N/A Necrotic A mount: Fat Layer (Subcutaneous Tissue): Yes N/A N/A Exposed Structures: Fascia: No Tendon: No Muscle: No Joint: No WYN, FLAM ([id number]) [id number] Page 5 of 9 Bone: No Small (1-33%) N/A N/A Epithelialization: Callus: Yes N/A N/A Periwound Skin Texture: Excoriation: No Induration: No Crepitus: No Rash: No Scarring: No Maceration: No N/A  N/A Periwound Skin Moisture: Dry/Scaly: No Atrophie Blanche: No N/A N/A Periwound Skin Color: Cyanosis: No Ecchymosis: No Erythema: No Hemosiderin Staining: No Mottled: No Pallor: No Rubor: No No Abnormality N/A N/A Temperature: Treatment Notes Wound #1 (Foot) Wound Laterality: Plantar, Left Cleanser Soap and Water Discharge Instruction: May shower and wash wound with dial antibacterial soap and water prior to dressing change. Peri-Wound Care Topical Primary Dressing MediHoney Gel, tube 1.5 (oz) Discharge Instruction: Apply to wound bed as instructed Secondary Dressing ADAPTIC TOUCH 3x4.25 in Discharge Instruction: Apply over primary dressing as directed. Woven Gauze Sponge, Non-Sterile 4x4 in Discharge Instruction: Apply over primary dressing as directed. Zetuvit Plus Silicone Border Dressing 4x4 (in/in) Discharge Instruction: Apply silicone border over primary dressing as directed. Secured With Compression Wrap Compression Stockings Environmental education officer) Signed: 06/25/2022 11:13:35 AM By: Kalman Shan DO Entered By: Kalman Shan on 06/25/2022 11:00:13 -------------------------------------------------------------------------------- [place] Details Patient Name: Date of Service: [name], [name] 06/25/2022 12:30 PM Medical Record Number: [id number] Patient Account Number: [id number] Date of Birth/Sex: Treating RN: November 10, 1940 (82 y.o. [name], Lauren Primary Care Andersen Mckiver: Dimas Chyle Other Clinician: Referring Liliahna Cudd: Treating Megumi Treaster/Extender: Jessee Avers in Treatment: [address] AYMAAN, GUNSALLUS ([id number]) [id number] Page 6 of 9 Nutrition Nursing Diagnoses: Impaired glucose control: actual or potential Goals: Patient/caregiver verbalizes understanding of need to maintain therapeutic glucose control per primary care physician Date Initiated:  03/01/2022 Target Resolution Date: 06/29/2022 Goal Status: Active Interventions: Assess HgA1c results as ordered upon admission and as needed Treatment Activities: Obtain HgA1c : 03/01/2022 Notes: Wound/Skin Impairment Nursing Diagnoses: Impaired tissue integrity Goals: Patient/caregiver will verbalize understanding of skin care regimen Date Initiated: 12/26/2021 Date Inactivated: 03/01/2022 Target Resolution Date: 03/23/2022 Unmet Reason: patient refuses Goal Status: Unmet offloading, advance tissue products, and following providers plan of care. Ulcer/skin breakdown will have a volume reduction of 30% by week 4 Date Initiated: 12/26/2021 Date Inactivated: 01/23/2022 Target Resolution Date: 01/23/2022 Goal Status: Met Ulcer/skin breakdown will have a volume reduction of 50% by week 8 Date Initiated: 01/23/2022 Target Resolution Date: 08/24/2022 Goal Status: Active Interventions: Assess patient/caregiver ability to obtain necessary supplies Assess patient/caregiver ability to perform ulcer/skin care regimen upon admission and as needed Assess ulceration(s) every visit Provide education on ulcer and skin care Treatment Activities: Topical wound management initiated :  12/26/2021 Notes: 01/23/22: Wound care regimen continues. Wound greater than 30% volume reduction, new goal initiated. Pt. not compliant to recommendations/ plan of care prescribed by Dr. Heber [place]. Electronic Signature(s) Signed: 06/27/2022 8:39:24 AM By: Rhae Hammock RN Entered By: Rhae Hammock on 06/25/2022 10:40:37 -------------------------------------------------------------------------------- Pain Assessment Details Patient Name: Date of Service: [name], [name] 06/25/2022 12:30 PM Medical Record Number: [id number] Patient Account Number: [id number] Date of Birth/Sex: Treating RN: April 26, 1941 (82 y.o. [name], Lauren Primary Care Gladis Soley: Dimas Chyle Other Clinician: Referring Adiel Erney: Treating  Analyse Angst/Extender: Laurena Slimmer Weeks in Treatment: 25 Active Problems Location of Pain Severity and Description of Pain Patient Has Paino No Site Locations JASIEL, SWANN E ([id number]) [phone].pdf Page 7 of 9 Pain Management and Medication Current Pain Management: Electronic Signature(s) Signed: 06/27/2022 8:39:24 AM By: Rhae Hammock RN Entered By: Rhae Hammock on 06/25/2022 10:30:20 -------------------------------------------------------------------------------- Patient/Caregiver Education Details Patient Name: Date of Service: [name]. 1/29/2024andnbsp12:30 PM Medical Record Number: [id number] Patient Account Number: [id number] Date of Birth/Gender: Treating RN: 03-07-1941 (82 y.o. [name] Primary Care Physician: Dimas Chyle Other Clinician: Referring Physician: Treating Physician/Extender: Jessee Avers in Treatment: 25 Education Assessment Education Provided To: Patient Education Topics Provided Wound/Skin Impairment: Methods: Explain/Verbal Responses: Reinforcements needed, State content correctly Electronic Signature(s) Signed: 06/27/2022 8:39:24 AM By: Rhae Hammock RN Entered By: Rhae Hammock on 06/25/2022 10:40:49 -------------------------------------------------------------------------------- Wound Assessment Details Patient Name: Date of Service: [name], [name] 06/25/2022 12:30 PM Medical Record Number: [id number] Patient Account Number: [id number] Date of Birth/Sex: Treating RN: 27-May-1941 (82 y.o. [name] Primary Care Pearlene Teat: Dimas Chyle Other Clinician: Referring Lyllie Cobbins: Treating Rahma Meller/Extender: Jabier, [place] ([id number]) [id number] Page 8 of 9 Weeks in Treatment: 25 Wound Status Wound Number: 1 Primary Diabetic Wound/Ulcer of the Lower Extremity Etiology: Wound  Location: Left, Plantar Foot Wound Open Wounding Event: Gradually Appeared Status: Date Acquired: 01/24/2021 Comorbid Arrhythmia, Congestive Heart Failure, Coronary Artery Disease, Weeks Of Treatment: 25 History: Type II Diabetes, Osteoarthritis, Neuropathy Clustered Wound: No Photos Wound Measurements Length: (cm) 1.4 Width: (cm) 1.5 Depth: (cm) 0.2 Area: (cm) 1.649 Volume: (cm) 0.33 % Reduction in Area: -2.9% % Reduction in Volume: 31.4% Epithelialization: Small (1-33%) Tunneling: No Undermining: Yes Starting Position (o'clock): 10 Ending Position (o'clock): 2 Maximum Distance: (cm) 0.2 Wound Description Classification: Grade 1 Wound Margin: Well defined, not attached Exudate Amount: Medium Exudate Type: Serosanguineous Exudate Color: red, brown Foul Odor After Cleansing: No Slough/Fibrino No Wound Bed Granulation Amount: Large (67-100%) Exposed Structure Granulation Quality: Red, Hyper-granulation Fascia Exposed: No Necrotic Amount: None Present (0%) Fat Layer (Subcutaneous Tissue) Exposed: Yes Tendon Exposed: No Muscle Exposed: No Joint Exposed: No Bone Exposed: No Periwound Skin Texture Texture Color No Abnormalities Noted: No No Abnormalities Noted: Yes Callus: Yes Temperature / Pain Crepitus: No Temperature: No Abnormality Excoriation: No Induration: No Rash: No Scarring: No Moisture No Abnormalities Noted: Yes Electronic Signature(s) Signed: 06/27/2022 8:39:24 AM By: Rhae Hammock RN Entered By: Rhae Hammock on 06/25/2022 10:31:28 [place], [name] ([id number]) [id number] Page 9 of 9 -------------------------------------------------------------------------------- Vitals Details Patient Name: Date of Service: [name] 06/25/2022 12:30 PM Medical Record Number: [id number] Patient Account Number: [id number] Date of Birth/Sex: Treating RN: 12/01/1940 (82 y.o. [name] Primary Care Tosca Pletz:  Dimas Chyle Other Clinician: Referring Cay Kath: Treating Sui Kasparek/Extender: Laurena Slimmer Weeks in Treatment: 25 Vital Signs Time Taken: 10:27 Temperature (F): 98.7 Height (in): 72 Pulse (  bpm): 79 Weight (lbs): 198 Respiratory Rate (breaths/min): 17 Body Mass Index (BMI): 26.9 Blood Pressure (mmHg): 103/68 Reference Range: 80 - 120 mg / dl Electronic Signature(s) Signed: 06/27/2022 8:39:24 AM By: Rhae Hammock RN Entered By: Rhae Hammock on 06/25/2022 10:28:14

## 2022-07-02 ENCOUNTER — Telehealth: Payer: Self-pay | Admitting: Internal Medicine

## 2022-07-02 NOTE — Telephone Encounter (Signed)
Pt c/o of Chest Pain: STAT if CP now or developed within 24 hours  1. Are you having CP right now?   No  2. Are you experiencing any other symptoms (ex. SOB, nausea, vomiting, sweating)?   No  3. How long have you been experiencing CP?   2-3 weeks ago  4. Is your CP continuous or coming and going?   Coming and going  5. Have you taken Nitroglycerin?   No  Patient is concerned about his chest pains.   ?

## 2022-07-02 NOTE — Telephone Encounter (Signed)
Contacted [name], who states he has been having chest pain on and off for about 2-3 weeks. Just recently it was severe enough he went to [facility] Emergency Room- notes in epic,. Patient did have blood work in the system- however he is just concerned that this keep happening. He states he does not have any other symptoms during these episodes. He states the chest pain comes randomly, and the duration of symptoms are not the same each time. BP at home have been good. Yesterday- 116/73 HR 62, and this morning 114/72 HR 62.   He denies any changes to medications. Denies sob, swelling, weight gain, arm pain. Chest wall area is not sore if he touches around it. He did state he took a tylenol last night and it improved and he went to bed.   Per review, patient was well at last OV- however with increase in symptoms patient requested appointment- scheduled with NP next week 02/15. Patient aware, advised I would send to MD to review.

## 2022-07-05 ENCOUNTER — Encounter (HOSPITAL_COMMUNITY): Payer: Self-pay | Admitting: *Deleted

## 2022-07-10 NOTE — Progress Notes (Unsigned)
Cardiology Office Note:    Date:  07/12/2022   ID:  [name], DOB [birth year], MRN [id number]  PCP:  Tyrone Barrack, MD   [place] Providers Cardiologist:  Tyrone Casino, MD {   Referring MD: Tyrone Barrack, MD   Chief Complaint  Patient presents with   Chest Roberts    History of Present Illness:    [name] is a 82 y.o. male with a hx of CHF/nonischemic cardiomyopathy, CAD, HTN, HLD, type 2 DM, right BKA, SVT, history of atrial fibrillation. Patient is followed by [name] and presents today for   Per chart review, patient was previously seen by [name] in 2013 for evaluation of PVCs. He had an echocardiogram on 08/02/11 that showed EF 55-60%.  Nuclear stress test on 08/30/11 showed no evidence of ischemia. Later, an echocardiogram on 12/11/15 showed EF 45-50%. Patient wore a cardiac monitor in 12/2015 that showed normal sinus rhythm with occasional PVCs, frequent sustained runs of SVT with heart rates in the 150s.  Patient again underwent nuclear stress testing on 02/08/16 that showed no ischemia.  He was treated with carvedilol.  Echocardiogram in 08/2017 showed that EF was down to 40-45%, moderate LVH, moderate RV hypokinesis, ascending aorta dilated to 39 mm.  Patient was started on Entresto. He underwent cardiac catheterization on 06/03/2018 that showed moderate coronary atherosclerosis with 70% ostial stenosis and small first obtuse marginal, 40% stenosis in the first diagonal, 50% mid LAD stenosis, 40% stenosis in mid RCA, 60% stenosis in proximal PDA, 50% stenosis in the left ventricular branch of right coronary.  There were no targets for intervention, recommended medical therapy.  [name] was concerned that drop in EF was due to PVC mediated cardiomyopathy and recommended patient wear heart monitor.  However patient declined.  Echocardiogram in 11/14/2020 showed EF 45-50%, moderate dilation of LA and RA, moderate dilation of the ascending aorta measuring 45 mm.    Patient was admitted to the hospital in 04/2021 after presenting to the ED with left foot bleeding.  He was found to be in atrial flutter with RVR of unknown duration.  Patient was started on Eliquis and digoxin.  He underwent successful cardioversion on 06/08/2021 with [name].  Most recent echocardiogram from 11/16/2021 showed EF 50-55%, mild LVH, normal RV systolic function, mild mitral valve regurgitation, mild aortic valve regurgitation, mild dilation of the ascending aortic root measuring 41 mm.  Patient was last seen by [name] on 01/11/2022.  At that time, patient reported he was doing well.  He remained on Entresto and carvedilol.  BP was well-controlled.  Patient was seen at the ED through [name] on 06/23/2022 complaining of chest Roberts.  Per review of their notes, Roberts was located under the left breast.  Roberts was coming and going.  Troponins negative x 2.  Chest x-ray was normal. Patient was discharged from the ED with instruction to follow up with his cardiologist.   On interview, patient reports that his chest Roberts "does not bother me". Reports that every now and then, he gets a "stinging" Roberts over the left side of his chest. The Roberts occurs randomly, and is not associated with exertion. The stinging feeling only lasts for a few minutes at a time, and goes away on its own. He does not take SL nitroglycerin. He reports that he has been having this same chest Roberts for well over 10 years and reports that it rarely bothers him. I offered  a nuclear stress test, but patient declined. He denies chest pressure, chest Roberts on exertion. Denies shortness of breath, palpitations, dizziness, syncope, near syncope. He did have a cold about 2-3 weeks ago, but he feels like he has recovered well. He is considering marrying a girl that he dated in high school.    Past Medical History:  Diagnosis Date   CAD (coronary artery disease)    a. Remote cath 2008 for chest Roberts showed 30% LAD with luminal  irregularity and fairly heavy calcification in the mid LAD without critical stenosis, 30% OM1, 30% acute marginal.   Cancer (HCC)    skin cancer - basal   Cataract    both   Chronic combined systolic and diastolic CHF (congestive heart failure) (HCC)    Complication of anesthesia    Diabetes mellitus    Type II   ED (erectile dysfunction)    History of kidney stones    x1   Hypertension    NICM (nonischemic cardiomyopathy) ([place])    a. EF 40-45% by echo in 08/2017 - prior low risk stress test in 2017.   Osteoarthritis    Paroxysmal SVT (supraventricular tachycardia)    PONV (postoperative nausea and vomiting) 1960   with Ether   PVC's (premature ventricular contractions)    PVD (peripheral vascular disease) (HCC)    s/p R BKA   Rheumatic fever    Rhinitis    S/P BKA (below knee amputation), right [facility])     Past Surgical History:  Procedure Laterality Date   AMPUTATION Right 02/29/2016   Procedure: Right Leg AMPUTATION BELOW KNEE with Wound Vac placement;  Surgeon: [name], MD;  Location: [facility];  Service: Orthopedics;  Laterality: Right;   CARDIOVERSION N/A 06/08/2021   Procedure: CARDIOVERSION;  Surgeon: Jerline Pain, MD;  Location: [facility] ENDOSCOPY;  Service: Cardiovascular;  Laterality: N/A;   CHOLECYSTECTOMY  1983   colonoscopy  2006, 2010   EYE SURGERY Bilateral    cataract   INGUINAL HERNIA REPAIR Right 1960   LEFT HEART CATH AND CORONARY ANGIOGRAPHY N/A 06/03/2018   Procedure: LEFT HEART CATH AND CORONARY ANGIOGRAPHY;  Surgeon: Belva Crome, MD;  Location: [place] CV LAB;  Service: Cardiovascular;  Laterality: N/A;   STUMP REVISION Right 10/30/2017   Procedure: RIGHT BELOW KNEE AMPUTATION REVISION;  Surgeon: [name], MD;  Location: [place];  Service: Orthopedics;  Laterality: Right;    Current Medications: Current Meds  Medication Sig   Accu-Chek Softclix Lancets lancets Use to check blood sugar three times a day.   acetaminophen (TYLENOL) 500 MG tablet  Take 500-1,000 mg by mouth every 6 (six) hours as needed (Roberts).   apixaban (ELIQUIS) 5 MG TABS tablet Take 1 tablet (5 mg total) by mouth 2 (two) times daily.   carvedilol (COREG) 25 MG tablet Take 1 tablet (25 mg total) by mouth 2 (two) times daily.   furosemide (LASIX) 20 MG tablet Take 2 tablets (40 mg total) by mouth daily.   gabapentin (NEURONTIN) 100 MG capsule TAKE 1 CAPSULE BY MOUTH THREE TIMES A DAY Strength: 100 mg (Patient taking differently: 2 (two) times daily. TAKE 1 CAPSULE BY MOUTH THREE TIMES A DAY Strength: 100 mg)   glucose blood (ACCU-CHEK AVIVA PLUS) test strip Use 3 times daily. Morning, noon, and nightly to check glucose levels Dx: E11.9 (type 2 diabetes mellitus)   halobetasol (ULTRAVATE) 0.05 % cream APPLY TO AFFECTED AREA TWICE A DAY   metFORMIN (GLUCOPHAGE) 1000 MG  tablet TAKE 1 TABLET BY MOUTH EVERY DAY WITH BREAKFAST   nystatin cream (MYCOSTATIN) APPLY TO AFFECTED AREA TWICE A DAY (Patient taking differently: Apply 1 application  topically 2 (two) times daily as needed (skin irritation).)   pantoprazole (PROTONIX) 40 MG tablet TAKE 1 TABLET BY MOUTH EVERY DAY   potassium chloride SA (KLOR-CON M20) 20 MEQ tablet TAKE 2 TABLETS BY MOUTH EVERY MORNING   promethazine-dextromethorphan (PROMETHAZINE-DM) 6.25-15 MG/5ML syrup Take 5 mLs by mouth 3 (three) times daily as needed for cough (Can cause drowsiness; Ok to take at bedtime.).   sacubitril-valsartan (ENTRESTO) 49-51 MG Take 1 tablet by mouth 2 (two) times daily.   sildenafil (VIAGRA) 100 MG tablet TAKE 1 TABLET DAILY AS NEEDED ED   solifenacin (VESICARE) 10 MG tablet TAKE 1 TABLET BY MOUTH EVERY DAY   tamsulosin (FLOMAX) 0.4 MG CAPS capsule TAKE 1 CAPSULE BY MOUTH EVERY DAY     Allergies:   Lisinopril   Social History   Socioeconomic History   Marital status: Married    Spouse name: Not on file   Number of children: 1   Years of education: 16+   Highest education level: Not on file  Occupational History    Occupation: Curator - [employer]    Employer: RETIRED  Tobacco Use   Smoking status: Former    Years: 20.00    Types: Cigarettes    Quit date: 07/26/1981    Years since quitting: 40.9   Smokeless tobacco: Never  Vaping Use   Vaping Use: Never used  Substance and Sexual Activity   Alcohol use: Not Currently    Comment: occasional glass of wine   Drug use: No   Sexual activity: Not Currently  Other Topics Concern   Not on file  Social History Narrative   Lives with wife.   epworth sleepiness scale = 5 (01/20/16)   Social Determinants of Health   Financial Resource Strain: Medium Risk (05/11/2021)   Overall Financial Resource Strain (CARDIA)    Difficulty of Paying Living Expenses: Somewhat hard  Food Insecurity: No Food Insecurity (05/11/2021)   Hunger Vital Sign    Worried About Running Out of Food in the Last Year: Never true    Ran Out of Food in the Last Year: Never true  Transportation Needs: No Transportation Needs (05/11/2021)   PRAPARE - Hydrologist (Medical): No    Lack of Transportation (Non-Medical): No  Physical Activity: Not on file  Stress: Not on file  Social Connections: Not on file     Family History: The patient's family history includes Coronary artery disease (age of onset: 78) in his brother; Stroke (age of onset: 42) in his father. There is no history of Colon cancer, Colon polyps, Diabetes, Esophageal cancer, Kidney disease, or Gallbladder disease.  ROS:   Please see the history of present illness.     All other systems reviewed and are negative.  EKGs/Labs/Other Studies Reviewed:    The following studies were reviewed today:  Left Heart Catheterization 06/03/18 Difficult procedure from the right radial due to tortuosity and difficulty torquing diagnostic catheters. Right dominant coronary anatomy Moderate coronary atherosclerosis with 70% ostial stenosis and small first obtuse marginal, 40 %  stenosis in the first diagonal, 50 % mid LAD stenosis, 40% mid RCA, 60% proximal PDA, and 50% left ventricular branch of right coronary. No significant obstructive CAD is identified. Normal LVEDP   RECOMMENDATIONS:   Recent episodes of chest Roberts  at rest, which are atypical in nature, are not due to significant obstructive disease.  Suspect nonischemic etiology. Aggressive secondary risk modification with LDL less than 70 and other risk factor management as pertains to the patients on risk profile. Diagnostic Dominance: Right   Echocardiogram 11/16/21 1. Basal inferior, inferoseptal hypokinesis. Overall no signficant change  in LVEF form 2022.Marland Kitchen Left ventricular ejection fraction, by estimation, is  50 to 55%. The left ventricle has low normal function. There is mild left  ventricular hypertrophy. Left  ventricular diastolic parameters were normal.   2. Right ventricular systolic function is normal. The right ventricular  size is normal.   3. Left atrial size was mildly dilated.   4. The mitral valve is normal in structure. Mild mitral valve  regurgitation.   5. The aortic valve is tricuspid. Aortic valve regurgitation is mild.  Aortic valve sclerosis is present, with no evidence of aortic valve  stenosis.   6. Aortic dilatation noted. There is mild dilatation of the aortic root,  measuring 41 mm.    Recent Labs: 10/13/2021: Hemoglobin 13.0; Platelets 225  Recent Lipid Panel    Component Value Date/Time   CHOL 148 09/04/2019 0918   TRIG 94.0 09/04/2019 0918   HDL 36.00 (L) 09/04/2019 0918   CHOLHDL 4 09/04/2019 0918   VLDL 18.8 09/04/2019 0918   LDLCALC 93 09/04/2019 0918   LDLDIRECT 84.0 09/10/2016 1614     Risk Assessment/Calculations:    CHA2DS2-VASc Score = 6  This indicates a 9.7% annual risk of stroke. The patient's score is based upon: CHF History: 1 HTN History: 1 Diabetes History: 1 Stroke History: 0 Vascular Disease History: 1 Age Score: 2 Gender Score:  0     Physical Exam:    VS:  BP 108/70 (BP Location: Left Arm, Patient Position: Sitting)   Pulse 69   Ht 6' (1.829 m)   Wt 210 lb 9.6 oz (95.5 kg)   SpO2 97%   BMI 28.56 kg/m     Wt Readings from Last 3 Encounters:  07/12/22 210 lb 9.6 oz (95.5 kg)  06/18/22 214 lb 2 oz (97.1 kg)  02/26/22 206 lb 6.1 oz (93.6 kg)     GEN:  Elderly male, sitting comfortably in the chair in no acute distress  HEENT: Normal NECK: No JVD CARDIAC: RRR. Faint systolic murmur at LUSB  RESPIRATORY:  Clear to auscultation without rales, wheezing or rhonchi. Normal WOB on room air   ABDOMEN: Soft, non-tender, non-distended MUSCULOSKELETAL:  No edema in Left Lower extremity. S/P right BKA  SKIN: Warm and dry NEUROLOGIC:  Alert and oriented x 3 PSYCHIATRIC:  Normal affect   ASSESSMENT:    1. Coronary artery disease involving native coronary artery of native heart without angina pectoris   2. Chronic systolic heart failure ([place])   3. Nonischemic cardiomyopathy ([place])   4. PAF (paroxysmal atrial fibrillation) ([place])   5. Essential hypertension   6. Aneurysm of ascending aorta without rupture ([place])   7. Hyperlipidemia LDL goal <70    PLAN:    In order of problems listed above:  Chest Roberts  CAD  - Left Heart catheterization from 05/2018 showed moderate coronary atherosclerosis with 70% stenosis in ostial obtuse marginal, 40% stenosis in first diagonal, 50% stenosis in mid LAD, 40% stenosis mid RCA, 60% stenosis proximal PDA, and 50% stenosis in left ventricular branch of right coronary - Now, patient complains of episodes of chest Roberts that feel like he got "stung". Episodes occur randomly, only  last a few minutes at a time. Go away on their own. Not associated with exertion. Patient denies chest pressure or symptoms on exertion. Reports that the chest Roberts really doesn't bother him  - Offered nuclear stress test, but patient declined. He reports that he has had this "stinging" for well over 10  years - Continue carvedilol 25 mg BID - Not on ASA as patient is on eliquis  - Patient asked if viagra was safe to take with his heart history. Instructed patient not to take viagra within 24 hours of taking SL nitroglycerin as this can cause dangerously low BP. Encouraged him to take his blood pressure prior to taking viagra and to not take viagra if his systolic BP is less than [id number]  - Patient has not been on a statin for unknown reasons. Discussed starting statin therapy as his cholesterol has been elevated in the past, but patient would prefer not to start any new medications unless they are necessary. Agreeable to getting a fasting lipid panel in the next few days and discussing statin therapy further if needed   Nonischemic Cardiomyopathy  Chronic Systolic Heart Failure  - Most recent echocardiogram from 11/16/21 showed EF 50-55% with basal inferior, inferoseptal hypokinesis (no change since 2022)  - Patient denies DOE, ankle edema, orthopnea.  - Continue carvedilol 25 mg BID, entresto 49-51 mg BID - Continue lasix 40 mg daily. Patient is euvolemic on exam  - Patient would prefer to not add on any additional medications at this time  - BMP checked on 06/23/22 showed creatinine 0.88, K 5.1 on current medications. Given slight bump in K, rechecking CMP when patient comes for fasting lipid panel   HTN - BP well controlled on current therapies    Paroxysmal Atrial Fibrillation  - Patient denies palpitations - Continue eliquis 5 mg BID. This is the correct dose for him  - Continue carvedilol 25 mg BID   Dilated Aortic Root  -Echocardiogram from 11/16/21 showed mild dilation of the aortic root measuring 41 mm - Will need repeat study in 1 year   HLD  - Lipid panel from 08/2019 showed LDL 93, HDL 36 - Ordered lipid panel, CMP to be drawn in the next few days when patient is fasting   Medication Adjustments/Labs and Tests Ordered: Current medicines are reviewed at length with the patient  today.  Concerns regarding medicines are outlined above.  Orders Placed This Encounter  Procedures   Lipid Profile   Comprehensive Metabolic Panel (CMET)   No orders of the defined types were placed in this encounter.   Patient Instructions  Medication Instructions:  The current medical regimen is effective;  continue present plan and medications as directed. Please refer to the Current Medication list given to you today.  *If you need a refill on your cardiac medications before your next appointment, please call your pharmacy*  Lab Work: FASTING LIPID SOMETIME NEXT WEEK If you have labs (blood work) drawn today and your tests are completely normal, you will receive your results only by: [place] (if you have MyChart) OR A paper copy in the mail If you have any lab test that is abnormal or we need to change your treatment, we will call you to review the results.  Testing/Procedures: NONE  Follow-Up: At [facility], you and your health needs are our priority.  As part of our continuing mission to provide you with exceptional heart care, we have created designated Provider Care Teams.  These Care  Teams include your primary Cardiologist (physician) and Advanced Practice Providers (APPs -  Physician Assistants and Nurse Practitioners) who all work together to provide you with the care you need, when you need it.  We recommend signing up for the patient portal called "MyChart".  Sign up information is provided on this After Visit Summary.  MyChart is used to connect with patients for Virtual Visits (Telemedicine).  Patients are able to view lab/test results, encounter notes, upcoming appointments, etc.  Non-urgent messages can be sent to your provider as well.   To learn more about what you can do with MyChart, go to [email or web address].    Your next appointment:   6 month(s)  Provider:   Pixie Casino, MD     Other Instructions     Signed, Margie Billet, PA-C  07/12/2022 5:00 PM    [place]

## 2022-07-12 ENCOUNTER — Other Ambulatory Visit: Payer: Self-pay | Admitting: Cardiology

## 2022-07-12 ENCOUNTER — Ambulatory Visit: Payer: Medicare Other | Attending: General Practice | Admitting: Cardiology

## 2022-07-12 ENCOUNTER — Encounter: Payer: Self-pay | Admitting: Cardiology

## 2022-07-12 VITALS — BP 108/70 | HR 69 | Ht 72.0 in | Wt 210.6 lb

## 2022-07-12 DIAGNOSIS — I10 Essential (primary) hypertension: Secondary | ICD-10-CM

## 2022-07-12 DIAGNOSIS — E785 Hyperlipidemia, unspecified: Secondary | ICD-10-CM

## 2022-07-12 DIAGNOSIS — I2510 Atherosclerotic heart disease of native coronary artery without angina pectoris: Secondary | ICD-10-CM

## 2022-07-12 DIAGNOSIS — I480 Paroxysmal atrial fibrillation: Secondary | ICD-10-CM

## 2022-07-12 DIAGNOSIS — I5022 Chronic systolic (congestive) heart failure: Secondary | ICD-10-CM | POA: Diagnosis not present

## 2022-07-12 DIAGNOSIS — I7121 Aneurysm of the ascending aorta, without rupture: Secondary | ICD-10-CM | POA: Diagnosis not present

## 2022-07-12 DIAGNOSIS — I428 Other cardiomyopathies: Secondary | ICD-10-CM | POA: Diagnosis not present

## 2022-07-12 NOTE — Patient Instructions (Signed)
Medication Instructions:  The current medical regimen is effective;  continue present plan and medications as directed. Please refer to the Current Medication list given to you today.  *If you need a refill on your cardiac medications before your next appointment, please call your pharmacy*  Lab Work: FASTING LIPID SOMETIME NEXT WEEK If you have labs (blood work) drawn today and your tests are completely normal, you will receive your results only by: [place] (if you have MyChart) OR A paper copy in the mail If you have any lab test that is abnormal or we need to change your treatment, we will call you to review the results.  Testing/Procedures: NONE  Follow-Up: At [facility], you and your health needs are our priority.  As part of our continuing mission to provide you with exceptional heart care, we have created designated Provider Care Teams.  These Care Teams include your primary Cardiologist (physician) and Advanced Practice Providers (APPs -  Physician Assistants and Nurse Practitioners) who all work together to provide you with the care you need, when you need it.  We recommend signing up for the patient portal called "MyChart".  Sign up information is provided on this After Visit Summary.  MyChart is used to connect with patients for Virtual Visits (Telemedicine).  Patients are able to view lab/test results, encounter notes, upcoming appointments, etc.  Non-urgent messages can be sent to your provider as well.   To learn more about what you can do with MyChart, go to [email or web address].    Your next appointment:   6 month(s)  Provider:   Pixie Casino, MD     Other Instructions

## 2022-07-24 DIAGNOSIS — R311 Benign essential microscopic hematuria: Secondary | ICD-10-CM | POA: Diagnosis not present

## 2022-08-01 ENCOUNTER — Other Ambulatory Visit: Payer: Self-pay | Admitting: Family Medicine

## 2022-08-01 DIAGNOSIS — L309 Dermatitis, unspecified: Secondary | ICD-10-CM

## 2022-08-02 ENCOUNTER — Telehealth: Payer: Self-pay | Admitting: Pharmacist

## 2022-08-02 NOTE — Progress Notes (Signed)
Care Management & Coordination Services Pharmacy Team  Reason for Encounter: General adherence update   Contacted patient for general health update and medication adherence call.  Spoke with patient on 08/02/2022    What concerns do you have about your medications? none  The patient denies side effects with their medications.   How often do you forget or accidentally miss a dose? Never  Do you use a pillbox? Yes  Are you having any problems getting your medications from your pharmacy? No  Has the cost of your medications been a concern? No If yes, what medication and is patient assistance available or has it been applied for?  Since last visit with PharmD, no interventions have been made.   The patient has not had an ED visit since last contact.   The patient denies problems with their health.   Patient denies concerns or questions for Leata Mouse, PharmD at this time.   Counseled patient on: Access to carecoordination team for any cost, medication or pharmacy concerns.  Patient states he has not had any chest pain since his last ED visit on 06/23/2022.   Chart Updates:  Recent office visits:  06/18/2022 OV (Fam Med) Jeanie Sewer, NP; Promethazine-DM syrup 3 times daily as needed for cough.  02/26/2022 OV (Fam Med) Inda Coke, PA; Will trial oral prednisone 20 mg daily x1 week followed by 10 milligrams daily x1 week   Recent consult visits:  07/12/2022 OV (Cardiology) Margie Billet, PA-C; no medication changes indicated.  Hospital visits:  06/23/2022 ED visit for Atypical chest pain -no medication changes indicated.  Medications: Outpatient Encounter Medications as of 08/02/2022  Medication Sig   Accu-Chek Softclix Lancets lancets Use to check blood sugar three times a day.   acetaminophen (TYLENOL) 500 MG tablet Take 500-1,000 mg by mouth every 6 (six) hours as needed (pain).   apixaban (ELIQUIS) 5 MG TABS tablet Take 1 tablet (5 mg total) by mouth 2  (two) times daily.   carvedilol (COREG) 25 MG tablet Take 1 tablet (25 mg total) by mouth 2 (two) times daily.   furosemide (LASIX) 20 MG tablet Take 2 tablets (40 mg total) by mouth daily.   gabapentin (NEURONTIN) 100 MG capsule TAKE 1 CAPSULE BY MOUTH THREE TIMES A DAY Strength: 100 mg (Patient taking differently: 2 (two) times daily. TAKE 1 CAPSULE BY MOUTH THREE TIMES A DAY Strength: 100 mg)   glucose blood (ACCU-CHEK AVIVA PLUS) test strip Use 3 times daily. Morning, noon, and nightly to check glucose levels Dx: E11.9 (type 2 diabetes mellitus)   halobetasol (ULTRAVATE) 0.05 % cream APPLY TO AFFECTED AREA TWICE A DAY   metFORMIN (GLUCOPHAGE) 1000 MG tablet TAKE 1 TABLET BY MOUTH EVERY DAY WITH BREAKFAST   nystatin cream (MYCOSTATIN) APPLY TO AFFECTED AREA TWICE A DAY (Patient taking differently: Apply 1 application  topically 2 (two) times daily as needed (skin irritation).)   pantoprazole (PROTONIX) 40 MG tablet TAKE 1 TABLET BY MOUTH EVERY DAY   potassium chloride SA (KLOR-CON M20) 20 MEQ tablet TAKE 2 TABLETS BY MOUTH EVERY MORNING   promethazine-dextromethorphan (PROMETHAZINE-DM) 6.25-15 MG/5ML syrup Take 5 mLs by mouth 3 (three) times daily as needed for cough (Can cause drowsiness; Ok to take at bedtime.).   sacubitril-valsartan (ENTRESTO) 49-51 MG Take 1 tablet by mouth 2 (two) times daily.   sildenafil (VIAGRA) 100 MG tablet TAKE 1 TABLET DAILY AS NEEDED ED   solifenacin (VESICARE) 10 MG tablet TAKE 1 TABLET BY MOUTH EVERY DAY  tamsulosin (FLOMAX) 0.4 MG CAPS capsule TAKE 1 CAPSULE BY MOUTH EVERY DAY   No facility-administered encounter medications on file as of 08/02/2022.    Recent vitals BP Readings from Last 3 Encounters:  07/12/22 108/70  06/18/22 (!) 152/73  02/26/22 110/70   Pulse Readings from Last 3 Encounters:  07/12/22 69  06/18/22 (!) 59  02/26/22 69   Wt Readings from Last 3 Encounters:  07/12/22 210 lb 9.6 oz (95.5 kg)  06/18/22 214 lb 2 oz (97.1 kg)   02/26/22 206 lb 6.1 oz (93.6 kg)   BMI Readings from Last 3 Encounters:  07/12/22 28.56 kg/m  06/18/22 29.04 kg/m  02/26/22 27.99 kg/m    Recent lab results    Component Value Date/Time   NA 140 06/21/2021 1128   K 4.3 06/21/2021 1128   CL 103 06/21/2021 1128   CO2 26 06/21/2021 1128   GLUCOSE 115 (H) 06/21/2021 1128   GLUCOSE 111 (H) 05/24/2021 1618   BUN 12 06/21/2021 1128   CREATININE 0.78 06/21/2021 1128   CREATININE 0.79 01/23/2016 1203   CALCIUM 9.1 06/21/2021 1128    Lab Results  Component Value Date   CREATININE 0.78 06/21/2021   GFR 84.54 11/21/2020   EGFR 90 06/21/2021   GFRNONAA >60 05/24/2021   GFRAA 99 07/10/2018   Lab Results  Component Value Date/Time   HGBA1C 5.5 11/14/2021 07:48 AM   HGBA1C 5.9 (H) 05/11/2021 06:19 AM   HGBA1C 5.4 03/11/2020 08:52 AM   HGBA1C 5.8 09/04/2019 09:18 AM   MICROALBUR 0.7 09/11/2016 12:06 PM   MICROALBUR <0.7 07/12/2015 04:35 PM    Lab Results  Component Value Date   CHOL 148 09/04/2019   HDL 36.00 (L) 09/04/2019   LDLCALC 93 09/04/2019   LDLDIRECT 84.0 09/10/2016   TRIG 94.0 09/04/2019   CHOLHDL 4 09/04/2019    Care Gaps: Annual wellness visit in last year? No  If Diabetic: Last eye exam / retinopathy screening:08/18/2020 Last diabetic foot exam: 10/02/2021 Last UACR: 09/11/2016  Star Rating Drugs:  Metformin 1000 mg last filled 05/29/2022 90 DS Entresto 49-51 mg last filled 07/18/2022 30 DS   Future Appointments  Date Time Provider [place]  12/24/2022  2:00 PM Edythe Clarity [place] None   April D Calhoun, [place] Pharmacist Assistant [phone]

## 2022-08-03 ENCOUNTER — Telehealth: Payer: Self-pay | Admitting: Internal Medicine

## 2022-08-03 DIAGNOSIS — I5022 Chronic systolic (congestive) heart failure: Secondary | ICD-10-CM

## 2022-08-03 MED ORDER — FUROSEMIDE 20 MG PO TABS: 40.0000 mg | ORAL_TABLET | Freq: Every day | ORAL | 3 refills | Status: DC

## 2022-08-03 NOTE — Telephone Encounter (Signed)
Pt's medication was sent to pt's pharmacy as requested. Confirmation received.  °

## 2022-08-03 NOTE — Telephone Encounter (Signed)
*  STAT* If patient is at the pharmacy, call can be transferred to refill team.   1. Which medications need to be refilled? (please list name of each medication and dose if known) furosemide (LASIX) 20 MG tablet   2. Which pharmacy/location (including street and city if local pharmacy) is medication to be sent to? [facility] [place], [place] - [address]   3. Do they need a 30 day or 90 day supply? 90 day   Patient is out of medication

## 2022-08-20 ENCOUNTER — Other Ambulatory Visit: Payer: Self-pay

## 2022-08-20 ENCOUNTER — Telehealth: Payer: Self-pay | Admitting: Internal Medicine

## 2022-08-20 DIAGNOSIS — I5022 Chronic systolic (congestive) heart failure: Secondary | ICD-10-CM

## 2022-08-20 MED ORDER — ENTRESTO 49-51 MG PO TABS: 1.0000 | ORAL_TABLET | Freq: Two times a day (BID) | ORAL | 1 refills | Status: DC

## 2022-08-20 NOTE — Telephone Encounter (Signed)
Called patietn to advise medication Refill sent to pharmacy. Left message forpatient.

## 2022-08-20 NOTE — Telephone Encounter (Signed)
*  STAT* If patient is at the pharmacy, call can be transferred to refill team.   1. Which medications need to be refilled? (please list name of each medication and dose if known)   sacubitril-valsartan (ENTRESTO) 49-51 MG   2. Which pharmacy/location (including street and city if local pharmacy) is medication to be sent to?  [facility] [place],  - [address]   3. Do they need a 30 day or 90 day supply?   90 day  Patient stated he only has 1 tablets left.

## 2022-09-07 ENCOUNTER — Telehealth: Payer: Self-pay

## 2022-09-07 NOTE — Patient Outreach (Signed)
  Care Coordination   09/07/2022 Name: [name] MRN: [id number] DOB: 08/31/1940   Care Coordination Outreach Attempts:  An unsuccessful telephone outreach was attempted today to offer the patient information about available care coordination services as a benefit of their health plan.   Follow Up Plan:  Additional outreach attempts will be made to offer the patient care coordination information and services.   Encounter Outcome:  No Answer   Care Coordination Interventions:  No, not indicated    Bevelyn Ngo, BSW, CDP Social Worker, Certified Dementia Practitioner [facility] Care Management  Care Coordination [phone]

## 2022-09-17 ENCOUNTER — Other Ambulatory Visit: Payer: Self-pay

## 2022-09-17 DIAGNOSIS — I4891 Unspecified atrial fibrillation: Secondary | ICD-10-CM

## 2022-09-17 MED ORDER — APIXABAN 5 MG PO TABS: 5.0000 mg | ORAL_TABLET | Freq: Two times a day (BID) | ORAL | 1 refills | Status: DC

## 2022-09-17 NOTE — Telephone Encounter (Signed)
Received faxed refill request for Eliquis from pharmacy.  Pt last saw Dr Rennis Golden 01/11/22, last labs 06/23/22 Creat 0.88, age 82, weight 95.5kg, based on specified criteria pt is on appropriate dosage of Eliquis  BID for afib.  Will refill rx.

## 2022-10-13 DIAGNOSIS — I10 Essential (primary) hypertension: Secondary | ICD-10-CM | POA: Diagnosis not present

## 2022-10-13 DIAGNOSIS — I4891 Unspecified atrial fibrillation: Secondary | ICD-10-CM | POA: Diagnosis not present

## 2022-10-13 DIAGNOSIS — R001 Bradycardia, unspecified: Secondary | ICD-10-CM | POA: Diagnosis not present

## 2022-10-13 DIAGNOSIS — R079 Chest pain, unspecified: Secondary | ICD-10-CM | POA: Diagnosis not present

## 2022-10-13 DIAGNOSIS — E119 Type 2 diabetes mellitus without complications: Secondary | ICD-10-CM | POA: Diagnosis not present

## 2022-10-13 DIAGNOSIS — R0789 Other chest pain: Secondary | ICD-10-CM | POA: Diagnosis not present

## 2022-10-13 DIAGNOSIS — I872 Venous insufficiency (chronic) (peripheral): Secondary | ICD-10-CM | POA: Diagnosis not present

## 2022-10-13 DIAGNOSIS — J849 Interstitial pulmonary disease, unspecified: Secondary | ICD-10-CM | POA: Diagnosis not present

## 2022-10-13 DIAGNOSIS — I8310 Varicose veins of unspecified lower extremity with inflammation: Secondary | ICD-10-CM | POA: Diagnosis not present

## 2022-10-16 ENCOUNTER — Ambulatory Visit (INDEPENDENT_AMBULATORY_CARE_PROVIDER_SITE_OTHER): Payer: Medicare Other | Admitting: Family Medicine

## 2022-10-16 ENCOUNTER — Encounter: Payer: Self-pay | Admitting: Family Medicine

## 2022-10-16 VITALS — BP 113/68 | HR 52 | Temp 97.5°F | Ht 72.0 in | Wt 218.0 lb

## 2022-10-16 DIAGNOSIS — E1100 Type 2 diabetes mellitus with hyperosmolarity without nonketotic hyperglycemic-hyperosmolar coma (NKHHC): Secondary | ICD-10-CM

## 2022-10-16 DIAGNOSIS — E1159 Type 2 diabetes mellitus with other circulatory complications: Secondary | ICD-10-CM | POA: Diagnosis not present

## 2022-10-16 DIAGNOSIS — I152 Hypertension secondary to endocrine disorders: Secondary | ICD-10-CM | POA: Diagnosis not present

## 2022-10-16 DIAGNOSIS — R0789 Other chest pain: Secondary | ICD-10-CM

## 2022-10-16 DIAGNOSIS — Z7984 Long term (current) use of oral hypoglycemic drugs: Secondary | ICD-10-CM | POA: Diagnosis not present

## 2022-10-16 NOTE — Patient Instructions (Signed)
It was very nice to see you today!  I am glad that you are feeling better.  Please let us know if your symptoms return.  Please come back soon for your annual physical.  Return in about 3 months (around 01/16/2023) for Annual Physical.   Take care, Dr [name]  PLEASE NOTE:  If you had any lab tests, please let us know if you have not heard back within a few days. You may see your results on mychart before we have a chance to review them but we will give you a call once they are reviewed by [place].   If we ordered any referrals today, please let us know if you have not heard from their office within the next week.   If you had any urgent prescriptions sent in today, please check with the pharmacy within an hour of our visit to make sure the prescription was transmitted appropriately.   Please try these tips to maintain a healthy lifestyle:  Eat at least 3 REAL meals and 1-2 snacks per day.  Aim for no more than 5 hours between eating.  If you eat breakfast, please do so within one hour of getting up.   Each meal should contain half fruits/vegetables, one quarter protein, and one quarter carbs (no bigger than a computer mouse)  Cut down on sweet beverages. This includes juice, soda, and sweet tea.   Drink at least 1 glass of water with each meal and aim for at least 8 glasses per day  Exercise at least 150 minutes every week.

## 2022-10-16 NOTE — Assessment & Plan Note (Signed)
Blood pressure at goal on Entresto 24-26 twice daily and Coreg 25 mg twice daily.

## 2022-10-16 NOTE — Assessment & Plan Note (Addendum)
Last A1c was 5.5.  We discussed checking today however deferred.  Advised him to come back soon for Comprehensive Physical Exam (CPE) preventive care annual visit and we can recheck then.  Will continue metformin 1000 mg daily.

## 2022-10-16 NOTE — Progress Notes (Signed)
   [name] is a 82 y.o. male who presents today for an office visit.  Assessment/Plan:  New/Acute Problems: Chest Pain No red flags.  Cardiac workup was negative.  Possibly musculoskeletal related to his new exercise routine a few weeks ago.  It is reassuring that symptoms have resolved.  Advised him to follow-up with cardiology for follow-up soon.  We discussed reasons to return to care and seek emergent care.   Chronic Problems Addressed Today: Hypertension associated with diabetes (HCC) Blood pressure at goal on Entresto 24-26 twice daily and Coreg 25 mg twice daily.  T2DM (type 2 diabetes mellitus) (HCC) Last A1c was 5.5.  We discussed checking today however deferred.  Advised him to come back soon for Comprehensive Physical Exam (CPE) preventive care annual visit and we can recheck then.  Will continue metformin 1000 mg daily.     Subjective:  HPI:  See A/P for status of chronic conditions.  Patient is here today for ED follow-up.  He went to the ED 3 days ago with a week of chest pain.  Had cardiac workup including EKG, chest x-ray, and cardiac enzymes.  All these were stable.  Delta troponin was trending down.  He was discharged home and told to follow-up with cardiology.  His pain has resolved over the last couple of days.  Pain described as a sharp sensation on left side of his chest. Happened randomly. No obvious precipitating or aggravating factors. No heartburn or reflux or indigestion. Pain comes and goes. No symptoms are non exertional.  He was doing more exercises on an elliptical couple weeks ago.  Did not have any obvious injuries at that time though thinks this may have caused his most recent episode of chest pain.       Objective:  Physical Exam: BP 113/68   Pulse (!) 52   Temp (!) 97.5 F (36.4 C) (Temporal)   Ht 6' (1.829 m)   Wt 218 lb (98.9 kg)   SpO2 96%   BMI 29.57 kg/m   Gen: No acute distress, resting comfortably CV: Regular rate and rhythm with  no murmurs appreciated Pulm: Normal work of breathing, clear to auscultation bilaterally with no crackles, wheezes, or rhonchi Neuro: Grossly normal, moves all extremities Psych: Normal affect and thought content      Alpha Mysliwiec M. [name], MD 10/16/2022 1:01 PM

## 2022-11-06 ENCOUNTER — Other Ambulatory Visit: Payer: Self-pay | Admitting: *Deleted

## 2022-11-06 ENCOUNTER — Telehealth: Payer: Self-pay | Admitting: Family Medicine

## 2022-11-06 MED ORDER — LORAZEPAM 0.5 MG PO TABS: 0.5000 mg | ORAL_TABLET | Freq: Two times a day (BID) | ORAL | 0 refills | Status: DC | PRN

## 2022-11-06 NOTE — Telephone Encounter (Signed)
Spoke with patient, stated need Rx Lorazapam  Stated had chest pain this morning due to anxiety, Rx lorazapam helped, has one pill left  Offered a appointment patient stated he need refill, or may go to ED for refill Was at the office a couple of days ago and forgot to ask Pcp for refill

## 2022-11-06 NOTE — Telephone Encounter (Signed)
Left message , medication send to pharmacy

## 2022-11-06 NOTE — Telephone Encounter (Signed)
Pt requesting refill for Lorazepam 0.5mg  . Pt reports he experienced chest pain this morning that was managed by the Lorazepam  . Not experiencing chest pain at this moment but pt is concerned as he only has one pill left . Patient states he has recently had  appointments pertaining to this .  Please advise [phone]

## 2022-11-07 NOTE — Telephone Encounter (Signed)
Called patient to check on him today  LVM to return call at his earliest convenience

## 2022-11-07 NOTE — Telephone Encounter (Signed)
Patient return call, stated doing good no chest pain at this time  Pickup medication.  Per patient no need for triage to call him

## 2022-11-07 NOTE — Telephone Encounter (Signed)
Attempted to contact patient but was sent to vm . Lvm requesting pt cb as soon as possible

## 2022-11-26 ENCOUNTER — Other Ambulatory Visit: Payer: Self-pay | Admitting: Family Medicine

## 2022-12-07 ENCOUNTER — Ambulatory Visit: Payer: Medicare Other | Admitting: Family Medicine

## 2022-12-07 ENCOUNTER — Ambulatory Visit: Payer: Medicare Other | Admitting: Family

## 2022-12-10 DIAGNOSIS — D485 Neoplasm of uncertain behavior of skin: Secondary | ICD-10-CM | POA: Diagnosis not present

## 2022-12-10 DIAGNOSIS — C44229 Squamous cell carcinoma of skin of left ear and external auricular canal: Secondary | ICD-10-CM | POA: Diagnosis not present

## 2022-12-10 DIAGNOSIS — C44219 Basal cell carcinoma of skin of left ear and external auricular canal: Secondary | ICD-10-CM | POA: Diagnosis not present

## 2022-12-10 DIAGNOSIS — Z85828 Personal history of other malignant neoplasm of skin: Secondary | ICD-10-CM | POA: Diagnosis not present

## 2022-12-10 DIAGNOSIS — L820 Inflamed seborrheic keratosis: Secondary | ICD-10-CM | POA: Diagnosis not present

## 2022-12-14 ENCOUNTER — Encounter: Payer: Self-pay | Admitting: Pharmacist

## 2022-12-14 NOTE — Progress Notes (Signed)
Patient previously followed by [facility] pharmacist. Per clinical review, no pharmacist appointment needed at this time.

## 2022-12-18 ENCOUNTER — Telehealth: Payer: Self-pay | Admitting: Internal Medicine

## 2022-12-18 NOTE — Telephone Encounter (Signed)
Pt c/o of Chest Pain: STAT if active CP, including tightness, pressure, jaw pain, radiating pain to shoulder/upper arm/back, CP unrelieved by Nitro. Symptoms reported of SOB, nausea, vomiting, sweating.  1. Are you having CP right now? This morning    2. Are you experiencing any other symptoms (ex. SOB, nausea, vomiting, sweating)? No    3. Is your CP continuous or coming and going? Coming and going   4. Have you taken Nitroglycerin? No    5. How long have you been experiencing CP? Since this morning    6. If NO CP at time of call then end call with telling Pt to call back or call 911 if Chest pain returns prior to return call from triage team.

## 2022-12-18 NOTE — Telephone Encounter (Signed)
Spoke to the patient, he is currently having sharpe chest pain since this morning, pt does not have nitroglycerin. Advised pt to call 911, pt stated he will call his daughter however, he could not remember her phone number. Advised pt we have her phone number on file, pt gave verbal permission to speak with his daughter. Spoke with pt daughter [name], explained the situation . [name] stated is currently at work however, she is " around the corner" from the pt. Offered to call 911 [employer] declined.   Explained to the patient, I spoke with his daughter. Made several attempts to call 911 for him, pt declined. Offered to stay on the phone with the patient until his daughter arrives, pt request office visit today with MD. Algis Downs pt due to his current symptoms, it's recommended to go the nearest emergency room for further evaluation. Pt was upset that we could not accommodate  him. Pt stated he doesn't " want to to go the emergency  room"  and " I was someone from your office to see me". Explained once again, ED precautions and pt replied " thanks for nothing " and hung up the phone.  Will forward to MD and nurse.

## 2022-12-24 ENCOUNTER — Encounter: Payer: Medicare Other | Admitting: Pharmacist

## 2022-12-24 ENCOUNTER — Other Ambulatory Visit: Payer: Self-pay | Admitting: Family Medicine

## 2022-12-24 DIAGNOSIS — I5022 Chronic systolic (congestive) heart failure: Secondary | ICD-10-CM

## 2022-12-24 NOTE — Telephone Encounter (Signed)
Prescription Request  12/24/2022  Is this a "Controlled Substance" medicine? No  LOV: 10/16/2022  What is the name of the medication or equipment? Entresto   Have you contacted your pharmacy to request a refill? Yes   Which pharmacy would you like this sent to?  [id number] [place], [place] - [address] [place] [address] Phone: [phone] Fax: [phone]  [id number] [place], [place] - [address] AT [facility] [address] [facility] [address] [facility] [address] [place] [address] Phone: [phone] Fax: [phone]    Patient notified that their request is being sent to the clinical staff for review and that they should receive a response within 2 business days.   Please advise at [facility] [phone]

## 2022-12-24 NOTE — Addendum Note (Signed)
Addended by: Dyann Kief on: 12/24/2022 03:33 PM   Modules accepted: Orders

## 2022-12-24 NOTE — Telephone Encounter (Addendum)
Last refills by Chrystie Nose, MD  Last OV 10/16/2022  Last refill on 08/20/2022 [phone]  Ref:1

## 2022-12-25 NOTE — Telephone Encounter (Signed)
This needs to go to cardiology.  Katina Degree. [name], MD 12/25/2022 7:21 AM

## 2022-12-26 ENCOUNTER — Encounter (INDEPENDENT_AMBULATORY_CARE_PROVIDER_SITE_OTHER): Payer: Self-pay

## 2022-12-31 ENCOUNTER — Encounter: Payer: Self-pay | Admitting: Family Medicine

## 2022-12-31 ENCOUNTER — Ambulatory Visit (INDEPENDENT_AMBULATORY_CARE_PROVIDER_SITE_OTHER): Payer: Medicare Other | Admitting: Family Medicine

## 2022-12-31 VITALS — BP 132/61 | HR 54 | Temp 97.5°F | Ht 72.0 in | Wt 216.0 lb

## 2022-12-31 DIAGNOSIS — L89899 Pressure ulcer of other site, unspecified stage: Secondary | ICD-10-CM | POA: Diagnosis not present

## 2022-12-31 DIAGNOSIS — Z7984 Long term (current) use of oral hypoglycemic drugs: Secondary | ICD-10-CM | POA: Diagnosis not present

## 2022-12-31 DIAGNOSIS — E559 Vitamin D deficiency, unspecified: Secondary | ICD-10-CM

## 2022-12-31 DIAGNOSIS — T8789 Other complications of amputation stump: Secondary | ICD-10-CM | POA: Diagnosis not present

## 2022-12-31 DIAGNOSIS — E1100 Type 2 diabetes mellitus with hyperosmolarity without nonketotic hyperglycemic-hyperosmolar coma (NKHHC): Secondary | ICD-10-CM | POA: Diagnosis not present

## 2022-12-31 DIAGNOSIS — E1159 Type 2 diabetes mellitus with other circulatory complications: Secondary | ICD-10-CM | POA: Diagnosis not present

## 2022-12-31 DIAGNOSIS — I152 Hypertension secondary to endocrine disorders: Secondary | ICD-10-CM | POA: Diagnosis not present

## 2022-12-31 DIAGNOSIS — N401 Enlarged prostate with lower urinary tract symptoms: Secondary | ICD-10-CM

## 2022-12-31 DIAGNOSIS — Z79899 Other long term (current) drug therapy: Secondary | ICD-10-CM

## 2022-12-31 LAB — COMPREHENSIVE METABOLIC PANEL
ALT: 6 U/L (ref 0–53)
AST: 9 U/L (ref 0–37)
Albumin: 4.1 g/dL (ref 3.5–5.2)
Alkaline Phosphatase: 57 U/L (ref 39–117)
BUN: 12 mg/dL (ref 6–23)
CO2: 31 mEq/L (ref 19–32)
Calcium: 9.2 mg/dL (ref 8.4–10.5)
Chloride: 103 mEq/L (ref 96–112)
Creatinine, Ser: 0.79 mg/dL (ref 0.40–1.50)
GFR: 82.33 mL/min (ref >60.00)
Glucose, Bld: 90 mg/dL (ref 70–99)
Potassium: 4.1 mEq/L (ref 3.5–5.1)
Sodium: 141 mEq/L (ref 135–145)
Total Bilirubin: 0.5 mg/dL (ref 0.2–1.2)
Total Protein: 7 g/dL (ref 6.0–8.3)

## 2022-12-31 LAB — VITAMIN B12: Vitamin B-12: 143 pg/mL — ABNORMAL LOW (ref 211–911)

## 2022-12-31 LAB — CBC
HCT: 36.8 % — ABNORMAL LOW (ref 39.0–52.0)
Hemoglobin: 12.1 g/dL — ABNORMAL LOW (ref 13.0–17.0)
MCHC: 32.9 g/dL (ref 30.0–36.0)
MCV: 87.2 fl (ref 78.0–100.0)
Platelets: 263 10*3/uL (ref 150.0–400.0)
RBC: 4.22 Mil/uL (ref 4.22–5.81)
RDW: 14.1 % (ref 11.5–15.5)
WBC: 7.2 10*3/uL (ref 4.0–10.5)

## 2022-12-31 LAB — MICROALBUMIN / CREATININE URINE RATIO
Creatinine,U: 75.3 mg/dL
Microalb Creat Ratio: 0.9 mg/g (ref 0.0–30.0)
Microalb, Ur: <0.7 mg/dL (ref 0.0–1.9)

## 2022-12-31 LAB — POCT GLYCOSYLATED HEMOGLOBIN (HGB A1C): Hemoglobin A1C: 5.8 % — AB (ref 4.0–5.6)

## 2022-12-31 LAB — TSH: TSH: 1.73 u[IU]/mL (ref 0.35–5.50)

## 2022-12-31 LAB — VITAMIN D 25 HYDROXY (VIT D DEFICIENCY, FRACTURES): VITD: 14.9 ng/mL — ABNORMAL LOW (ref 30.00–100.00)

## 2022-12-31 MED ORDER — GABAPENTIN 100 MG PO CAPS: 100.0000 mg | ORAL_CAPSULE | Freq: Three times a day (TID) | ORAL | 3 refills | Status: DC

## 2022-12-31 MED ORDER — LORAZEPAM 0.5 MG PO TABS: 0.5000 mg | ORAL_TABLET | Freq: Two times a day (BID) | ORAL | 0 refills | Status: DC | PRN

## 2022-12-31 MED ORDER — SILDENAFIL CITRATE 100 MG PO TABS: ORAL_TABLET | ORAL | 22 refills | Status: DC

## 2022-12-31 MED ORDER — CEPHALEXIN 500 MG PO CAPS: 500.0000 mg | ORAL_CAPSULE | Freq: Two times a day (BID) | ORAL | 0 refills | Status: AC

## 2022-12-31 NOTE — Progress Notes (Signed)
b  [name] is a 82 y.o. male who presents today for an office visit.  Assessment/Plan:  New/Acute Problems: Cellulitis  Symptoms are currently very mild.  Does have a small amount of surrounding erythema at his stump ulcer and may have mild cellulitis.  We will start Keflex.  No signs of systemic illness.  We did discuss wound care for his ulcer.  He does not wish to see wound care specialist at this point.  His A1c today is 5.8.  We discussed reasons to return to care or seek emergent care.  Fatigue May be postviral in nature though it is been a while since he has had labs checked.  Potential cellulitis may be contributing as well.  We are checking labs today.  Hopefully this will continue to improve over the next several weeks.  Chronic Problems Addressed Today: Pressure ulcer of BKA stump (HCC) Patient with a small ulcer today at distal site of right BKA stump.  We are treating for mild cellulitis as above.  This has been present for several months and has been following with the [facility] for management.  We discussed wound care for his ulcer and recommended wound care specialist.  He declined at this point.  We did discuss importance of promoting healing and prevention of infection to avoid further amputations.    He will continue to monitor at home.  He is trying increasing the compression on his stump.  Also discussed importance of avoiding pressure on the area.  He will follow-up with [place] in a few weeks.  Would consider referral to wound care or orthopedics if not improving with above.  Hypertension associated with diabetes (HCC) Blood pressure at goal on Entresto 24-26 twice daily and Coreg 25 mg twice daily.  T2DM (type 2 diabetes mellitus) (HCC) A1c 5.8.  Continue metformin 1000 mg daily.     Subjective:  HPI:  See A/P for status of chronic conditions.  Patient is here with his daughter today.  Main concern today is wound on his right leg stump.  This has been  present for a while and he has been following with the [facility] for this.  This has been present for several months to years.  They are concerned may be infected.  Redness to the area.  He did see [facility] about a week ago and they recommended more socks and increased tightness on his stump.  No purulent drainage.  No pain.  He also did have COVID a couple of weeks ago.  Since then he has had issues with fatigue and brain fog.  He had a lot of head congestion and coughed.  He does not feel like this is causing issues however daughter is concerned about his ongoing issues with fatigue and forgetfulness.       Objective:  Physical Exam: BP 132/61   Pulse (!) 54   Temp (!) 97.5 F (36.4 C) (Temporal)   Ht 6' (1.829 m)   Wt 216 lb (98 kg)   SpO2 95%   BMI 29.29 kg/m   Gen: No acute distress, resting comfortably MUSCULOSKELETAL: - Right Leg: Approximately 1 cm ulcer on distal stump.  Healthy granulation tissue present.  Small amount of surrounding erythema. Neuro: Grossly normal, moves all extremities Psych: Normal affect and thought content      [name] M. Tyrone Ralph, MD 12/31/2022 2:19 PM

## 2022-12-31 NOTE — Assessment & Plan Note (Signed)
A1c 5.8.  Continue metformin 1000 mg daily.

## 2022-12-31 NOTE — Assessment & Plan Note (Addendum)
Patient with a small ulcer today at distal site of right BKA stump.  We are treating for mild cellulitis as above.  This has been present for several months and has been following with the [facility] for management.  We discussed wound care for his ulcer and recommended wound care specialist.  He declined at this point.  We did discuss importance of promoting healing and prevention of infection to avoid further amputations.    He will continue to monitor at home.  He is trying increasing the compression on his stump.  Also discussed importance of avoiding pressure on the area.  He will follow-up with [place] in a few weeks.  Would consider referral to wound care or orthopedics if not improving with above.

## 2022-12-31 NOTE — Assessment & Plan Note (Signed)
Blood pressure at goal on Entresto 24-26 twice daily and Coreg 25 mg twice daily.

## 2022-12-31 NOTE — Patient Instructions (Addendum)
It was very nice to see you today!  We will check blood work today.  Please keep the area clean and dry.  Will send in a small supply ofAntibiotics.  You can try using medical grade manuka honey to help promote healing.  Please let me know if not improving.  Return in about 6 months (around 07/03/2023) for Follow Up.   Take care, Dr [name]  PLEASE NOTE:  If you had any lab tests, please let us know if you have not heard back within a few days. You may see your results on mychart before we have a chance to review them but we will give you a call once they are reviewed by [place].   If we ordered any referrals today, please let us know if you have not heard from their office within the next week.   If you had any urgent prescriptions sent in today, please check with the pharmacy within an hour of our visit to make sure the prescription was transmitted appropriately.   Please try these tips to maintain a healthy lifestyle:  Eat at least 3 REAL meals and 1-2 snacks per day.  Aim for no more than 5 hours between eating.  If you eat breakfast, please do so within one hour of getting up.   Each meal should contain half fruits/vegetables, one quarter protein, and one quarter carbs (no bigger than a computer mouse)  Cut down on sweet beverages. This includes juice, soda, and sweet tea.   Drink at least 1 glass of water with each meal and aim for at least 8 glasses per day  Exercise at least 150 minutes every week.

## 2022-12-31 NOTE — Telephone Encounter (Signed)
See note

## 2023-01-01 ENCOUNTER — Other Ambulatory Visit: Payer: Self-pay | Admitting: *Deleted

## 2023-01-01 DIAGNOSIS — E611 Iron deficiency: Secondary | ICD-10-CM

## 2023-01-01 MED ORDER — VITAMIN D (ERGOCALCIFEROL) 1.25 MG (50000 UNIT) PO CAPS: 50000.0000 [IU] | ORAL_CAPSULE | ORAL | 0 refills | Status: DC

## 2023-01-01 NOTE — Progress Notes (Signed)
His blood counts dropped a little bit since last time we checked.  This could be causing some of his issues.  Please add on iron panel if we are able to.  If not please have him come back to have this done.  His B12 is low.  This also could be contributing to his issues.  Recommend he come here to start B12 protocol.  Vitamin D is low.  Recommend he start 50,000 IUs once weekly.  Please send in prescription for this.  We should recheck in 3 months.  All of the other labs are stable.

## 2023-01-02 ENCOUNTER — Ambulatory Visit: Payer: Medicare Other | Admitting: Nurse Practitioner

## 2023-01-02 NOTE — Telephone Encounter (Signed)
See results note. 

## 2023-01-04 ENCOUNTER — Other Ambulatory Visit: Payer: Medicare Other

## 2023-01-07 ENCOUNTER — Other Ambulatory Visit: Payer: Medicare Other

## 2023-01-07 DIAGNOSIS — E611 Iron deficiency: Secondary | ICD-10-CM | POA: Diagnosis not present

## 2023-01-08 ENCOUNTER — Encounter: Payer: Self-pay | Admitting: Family Medicine

## 2023-01-08 LAB — IRON,TIBC AND FERRITIN PANEL
%SAT: 23 % (ref 20–48)
Ferritin: 67 ng/mL (ref 24–380)
Iron: 68 ug/dL (ref 50–180)
TIBC: 290 ug/dL (ref 250–425)

## 2023-01-08 NOTE — Telephone Encounter (Signed)
Spoke with daughter, not sure why it was not done. I apology to her  Schedule nurse visit on next Friday only day she is able to bring patient in for B12 INJ

## 2023-01-09 NOTE — Progress Notes (Signed)
His iron panel is normal.  He does not have low-lying.  Blood counts are probably low due to his B12.  This should improve once we get him back on the B12 protocol.

## 2023-01-10 ENCOUNTER — Encounter: Payer: Self-pay | Admitting: Family Medicine

## 2023-01-10 ENCOUNTER — Encounter (INDEPENDENT_AMBULATORY_CARE_PROVIDER_SITE_OTHER): Payer: Self-pay

## 2023-01-11 ENCOUNTER — Ambulatory Visit: Payer: Medicare Other | Admitting: *Deleted

## 2023-01-11 DIAGNOSIS — E538 Deficiency of other specified B group vitamins: Secondary | ICD-10-CM | POA: Diagnosis not present

## 2023-01-11 MED ORDER — CYANOCOBALAMIN 1000 MCG/ML IJ SOLN
1000.0000 ug | Freq: Once | INTRAMUSCULAR | Status: AC
Start: 2023-01-11 — End: 2023-01-11
  Administered 2023-01-11: 1000 ug via INTRAMUSCULAR

## 2023-01-11 NOTE — Progress Notes (Signed)
Patient presents for B12 injection today. Patient received her B12 injection in Right Deltoid. Patient tolerated injection well.  Documentation entered in MAR in Epic[employer].   

## 2023-01-14 NOTE — Telephone Encounter (Signed)
Ok to send in doxycycline 100 mg twice daily x 7 days.  Needs appointment if still not improving.  Katina Degree. [name], MD 01/14/2023 7:25 AM

## 2023-01-17 ENCOUNTER — Other Ambulatory Visit: Payer: Self-pay | Admitting: *Deleted

## 2023-01-17 ENCOUNTER — Other Ambulatory Visit: Payer: Self-pay | Admitting: Family Medicine

## 2023-01-17 ENCOUNTER — Ambulatory Visit (INDEPENDENT_AMBULATORY_CARE_PROVIDER_SITE_OTHER): Payer: Medicare Other | Admitting: Orthopedic Surgery

## 2023-01-17 ENCOUNTER — Ambulatory Visit: Payer: Medicare Other | Admitting: Family Medicine

## 2023-01-17 ENCOUNTER — Ambulatory Visit: Payer: Medicare Other

## 2023-01-17 DIAGNOSIS — S88111S Complete traumatic amputation at level between knee and ankle, right lower leg, sequela: Secondary | ICD-10-CM

## 2023-01-17 MED ORDER — DOXYCYCLINE MONOHYDRATE 100 MG PO TABS: 100.0000 mg | ORAL_TABLET | Freq: Two times a day (BID) | ORAL | 0 refills | Status: AC

## 2023-01-17 MED ORDER — DOXYCYCLINE HYCLATE 100 MG PO TABS: 100.0000 mg | ORAL_TABLET | Freq: Two times a day (BID) | ORAL | 0 refills | Status: DC

## 2023-01-18 NOTE — Telephone Encounter (Signed)
Pt requesting refill for Lorazepam 0.5 mg. Last OV 12/31/2022.

## 2023-01-22 ENCOUNTER — Ambulatory Visit: Payer: Medicare Other

## 2023-01-23 ENCOUNTER — Other Ambulatory Visit: Payer: Self-pay | Admitting: *Deleted

## 2023-01-23 DIAGNOSIS — I5022 Chronic systolic (congestive) heart failure: Secondary | ICD-10-CM

## 2023-01-23 DIAGNOSIS — C44229 Squamous cell carcinoma of skin of left ear and external auricular canal: Secondary | ICD-10-CM | POA: Diagnosis not present

## 2023-01-23 DIAGNOSIS — C44219 Basal cell carcinoma of skin of left ear and external auricular canal: Secondary | ICD-10-CM | POA: Diagnosis not present

## 2023-01-23 DIAGNOSIS — Z85828 Personal history of other malignant neoplasm of skin: Secondary | ICD-10-CM | POA: Diagnosis not present

## 2023-01-23 MED ORDER — CARVEDILOL 25 MG PO TABS
25.0000 mg | ORAL_TABLET | Freq: Two times a day (BID) | ORAL | 2 refills | Status: DC
Start: 2023-01-23 — End: 2023-10-25

## 2023-01-25 ENCOUNTER — Ambulatory Visit: Payer: Medicare Other

## 2023-01-26 ENCOUNTER — Other Ambulatory Visit: Payer: Self-pay | Admitting: Family Medicine

## 2023-01-28 DIAGNOSIS — D649 Anemia, unspecified: Secondary | ICD-10-CM | POA: Diagnosis not present

## 2023-01-28 DIAGNOSIS — R600 Localized edema: Secondary | ICD-10-CM | POA: Diagnosis not present

## 2023-01-28 DIAGNOSIS — L97421 Non-pressure chronic ulcer of left heel and midfoot limited to breakdown of skin: Secondary | ICD-10-CM | POA: Diagnosis not present

## 2023-01-28 DIAGNOSIS — I2510 Atherosclerotic heart disease of native coronary artery without angina pectoris: Secondary | ICD-10-CM | POA: Diagnosis not present

## 2023-01-28 DIAGNOSIS — R531 Weakness: Secondary | ICD-10-CM | POA: Diagnosis not present

## 2023-01-28 DIAGNOSIS — R4181 Age-related cognitive decline: Secondary | ICD-10-CM | POA: Diagnosis not present

## 2023-01-28 DIAGNOSIS — I4891 Unspecified atrial fibrillation: Secondary | ICD-10-CM | POA: Diagnosis not present

## 2023-01-28 DIAGNOSIS — R410 Disorientation, unspecified: Secondary | ICD-10-CM | POA: Diagnosis not present

## 2023-01-28 DIAGNOSIS — E11621 Type 2 diabetes mellitus with foot ulcer: Secondary | ICD-10-CM | POA: Diagnosis not present

## 2023-01-28 DIAGNOSIS — L97429 Non-pressure chronic ulcer of left heel and midfoot with unspecified severity: Secondary | ICD-10-CM | POA: Diagnosis not present

## 2023-01-28 DIAGNOSIS — R0989 Other specified symptoms and signs involving the circulatory and respiratory systems: Secondary | ICD-10-CM | POA: Diagnosis not present

## 2023-01-28 DIAGNOSIS — R4182 Altered mental status, unspecified: Secondary | ICD-10-CM | POA: Diagnosis not present

## 2023-01-28 DIAGNOSIS — I10 Essential (primary) hypertension: Secondary | ICD-10-CM | POA: Diagnosis not present

## 2023-01-29 ENCOUNTER — Other Ambulatory Visit: Payer: Self-pay | Admitting: Family Medicine

## 2023-01-29 ENCOUNTER — Encounter: Payer: Self-pay | Admitting: Family Medicine

## 2023-01-29 ENCOUNTER — Encounter: Payer: Self-pay | Admitting: Orthopedic Surgery

## 2023-01-29 ENCOUNTER — Other Ambulatory Visit: Payer: Self-pay | Admitting: *Deleted

## 2023-01-29 MED ORDER — LORAZEPAM 0.5 MG PO TABS: 0.5000 mg | ORAL_TABLET | Freq: Two times a day (BID) | ORAL | 0 refills | Status: DC | PRN

## 2023-01-29 MED ORDER — POTASSIUM CHLORIDE CRYS ER 20 MEQ PO TBCR: EXTENDED_RELEASE_TABLET | ORAL | 3 refills | Status: DC

## 2023-01-29 NOTE — Progress Notes (Signed)
Office Visit Note   Patient: [name]           Date of Birth: November 04, 1940           MRN: [id number] Visit Date: 01/17/2023              Requested by: Ardith Dark, MD [address] [place],  [place] [address] PCP: Ardith Dark, MD  Chief Complaint  Patient presents with   Right Leg - Pain    Hx BKA 2019      HPI: Patient is an 82 year old gentleman who presents with pain in the residual limb right below-knee amputation.  Patient is status post transtibial amputation in 2019 he has a prosthesis.  Assessment & Plan: Visit Diagnoses:  1. Below-knee amputation of right lower extremity, sequela (HCC)     Plan: Patient is provided a prescription for doxycycline.  He is provided a new prescription for [employer] for new socket liner materials and supplies.  A prescription for a wheelchair as well.  Follow-Up Instructions: No follow-ups on file.   Ortho Exam  Patient is alert, oriented, no adenopathy, well-dressed, normal affect, normal respiratory effort. Examination patient has cellulitis and ulceration with clear drainage from the residual limb.  No exposed bone or tendon no purulent drainage.  Patient is an existing right transtibial  amputee.  Patient's current comorbidities are not expected to impact the ability to function with the prescribed prosthesis. Patient verbally communicates a strong desire to use a prosthesis. Patient currently requires mobility aids to ambulate without a prosthesis.  Expects not to use mobility aids with a new prosthesis.  Patient is a K3 level ambulator that spends a lot of time walking around on uneven terrain over obstacles, up and down stairs, and ambulates with a variable cadence.     Imaging: No results found. No images are attached to the encounter.  Labs: Lab Results  Component Value Date   HGBA1C 5.8 (A) 12/31/2022   HGBA1C 5.5 11/14/2021   HGBA1C 5.9 (H) 05/11/2021   ESRSEDRATE 17 10/13/2021   ESRSEDRATE 35 (H)  11/21/2020   ESRSEDRATE 25 (H) 12/10/2015   CRP 2.6 11/21/2020   CRP 0.8 12/10/2015   CRP 0.6 (H) 11/10/2013   REPTSTATUS 11/13/2017 FINAL 11/08/2017   CULT NO GROWTH 5 DAYS 11/08/2017   LABORGA MORGANELLA MORGANII (A) 12/10/2015     Lab Results  Component Value Date   ALBUMIN 4.1 12/31/2022   ALBUMIN 4.1 11/21/2020   ALBUMIN 4.1 07/07/2020    Lab Results  Component Value Date   MG 1.8 06/03/2018   MG 2.1 12/12/2015   MG 1.9 12/11/2015   Lab Results  Component Value Date   VD25OH 14.90 (L) 12/31/2022    No results found for: "PREALBUMIN"    Latest Ref Rng & Units 12/31/2022    2:14 PM 10/13/2021   11:21 AM 05/24/2021    4:18 PM  CBC EXTENDED  WBC 4.0 - 10.5 K/uL 7.2  5.8  8.5   RBC 4.22 - 5.81 Mil/uL 4.22  4.40  4.70   Hemoglobin 13.0 - 17.0 g/dL 86.5  78.4  69.6   HCT 39.0 - 52.0 % 36.8  37.7  42.9   Platelets 150.0 - 400.0 K/uL 263.0  225  231   NEUT# 1.4 - 7.0 x10E3/uL  3.9    Lymph# 0.7 - 3.1 x10E3/uL  1.2       There is no height or weight on file to calculate BMI.  Orders:  No orders of the defined types were placed in this encounter.  Meds ordered this encounter  Medications   doxycycline (VIBRA-TABS) 100 MG tablet    Sig: Take 1 tablet (100 mg total) by mouth 2 (two) times daily.    Dispense:  30 tablet    Refill:  0     Procedures: No procedures performed  Clinical Data: No additional findings.  ROS:  All other systems negative, except as noted in the HPI. Review of Systems  Objective: Vital Signs: There were no vitals taken for this visit.  Specialty Comments:  No specialty comments available.  PMFS History: Patient Active Problem List   Diagnosis Date Noted   Pressure ulcer of BKA stump (HCC) 12/31/2022   Coronary artery disease involving native coronary artery of native heart 08/17/2021   PAF (paroxysmal atrial fibrillation) (HCC) 08/17/2021   Diabetic foot ulcer (HCC) 05/15/2021   Alcohol use 05/15/2021   A-fib (HCC)  05/11/2021   Gastroesophageal reflux disease 12/08/2019   Dermatitis 03/10/2018   Benign prostatic hyperplasia with nocturia 11/12/2017   Nonischemic cardiomyopathy (HCC) 09/03/2017   AVNRT (AV nodal re-entry tachycardia) 01/30/2017   Chronic midline low back pain without sciatica 01/03/2017   H/O amputation of leg through tibia and fibula, right (HCC) 05/03/2016   Phantom limb pain (HCC) 03/06/2016   SVT (supraventricular tachycardia) 01/20/2016   Chronic systolic heart failure (HCC) 01/20/2016   Diabetic peripheral neuropathy (HCC) 08/23/2015   Esophageal dysphagia 08/06/2014   Anxiety state 10/26/2013   T2DM (type 2 diabetes mellitus) (HCC) 11/02/2010   BASAL CELL CARCINOMA, FACE 10/18/2008   LIPOMA, SKIN 05/31/2008   ERECTILE DYSFUNCTION 09/30/2007   OSTEOARTHRITIS 08/08/2007   Hypertension associated with diabetes (HCC) 03/24/2007   Past Medical History:  Diagnosis Date   CAD (coronary artery disease)    a. Remote cath 2008 for chest pain showed 30% LAD with luminal irregularity and fairly heavy calcification in the mid LAD without critical stenosis, 30% OM1, 30% acute marginal.   Cancer (HCC)    skin cancer - basal   Cataract    both   Chronic combined systolic and diastolic CHF (congestive heart failure) (HCC)    Complication of anesthesia    Diabetes mellitus    Type II   ED (erectile dysfunction)    History of kidney stones    x1   Hypertension    NICM (nonischemic cardiomyopathy) (HCC)    a. EF 40-45% by echo in 08/2017 - prior low risk stress test in 2017.   Osteoarthritis    Paroxysmal SVT (supraventricular tachycardia)    PONV (postoperative nausea and vomiting) 1960   with Ether   PVC's (premature ventricular contractions)    PVD (peripheral vascular disease) (HCC)    s/p R BKA   Rheumatic fever    Rhinitis    S/P BKA (below knee amputation), right (HCC)     Family History  Problem Relation Age of Onset   Stroke Father 20       Died age 17   Coronary  artery disease Brother 13   Colon cancer Neg Hx    Colon polyps Neg Hx    Diabetes Neg Hx    Esophageal cancer Neg Hx    Kidney disease Neg Hx    Gallbladder disease Neg Hx     Past Surgical History:  Procedure Laterality Date   AMPUTATION Right 02/29/2016   Procedure: Right Leg AMPUTATION BELOW KNEE with Wound Vac placement;  Surgeon: Nadara Mustard, MD;  Location: [facility] OR;  Service: Orthopedics;  Laterality: Right;   CARDIOVERSION N/A 06/08/2021   Procedure: CARDIOVERSION;  Surgeon: Jake Bathe, MD;  Location: [facility] ENDOSCOPY;  Service: Cardiovascular;  Laterality: N/A;   CHOLECYSTECTOMY  1983   colonoscopy  2006, 2010   EYE SURGERY Bilateral    cataract   INGUINAL HERNIA REPAIR Right 1960   LEFT HEART CATH AND CORONARY ANGIOGRAPHY N/A 06/03/2018   Procedure: LEFT HEART CATH AND CORONARY ANGIOGRAPHY;  Surgeon: Lyn Records, MD;  Location: [facility] INVASIVE CV LAB;  Service: Cardiovascular;  Laterality: N/A;   STUMP REVISION Right 10/30/2017   Procedure: RIGHT BELOW KNEE AMPUTATION REVISION;  Surgeon: Nadara Mustard, MD;  Location: [facility] OR;  Service: Orthopedics;  Laterality: Right;   Social History   Occupational History   Occupation: Hydrographic surveyor - [employer]    Employer: RETIRED  Tobacco Use   Smoking status: Former    Current packs/day: 0.00    Types: Cigarettes    Start date: 07/26/1961    Quit date: 07/26/1981    Years since quitting: 41.5   Smokeless tobacco: Never  Vaping Use   Vaping status: Never Used  Substance and Sexual Activity   Alcohol use: Not Currently    Comment: occasional glass of wine   Drug use: No   Sexual activity: Not Currently

## 2023-01-29 NOTE — Telephone Encounter (Signed)
Recommend referral to neurology.  Please have them schedule a follow up visit.   Katina Degree. [name], MD 01/29/2023 1:00 PM

## 2023-01-29 NOTE — Telephone Encounter (Signed)
  Please have them schedule a follow up visit, for ED F/U    [facility]. [name], MD

## 2023-02-01 ENCOUNTER — Ambulatory Visit: Payer: Medicare Other

## 2023-02-04 ENCOUNTER — Encounter: Payer: Self-pay | Admitting: Family Medicine

## 2023-02-04 ENCOUNTER — Ambulatory Visit (INDEPENDENT_AMBULATORY_CARE_PROVIDER_SITE_OTHER): Payer: Medicare Other | Admitting: Family Medicine

## 2023-02-04 VITALS — BP 92/60 | HR 63 | Temp 98.4°F | Ht 72.0 in | Wt 197.8 lb

## 2023-02-04 DIAGNOSIS — R413 Other amnesia: Secondary | ICD-10-CM | POA: Insufficient documentation

## 2023-02-04 DIAGNOSIS — E538 Deficiency of other specified B group vitamins: Secondary | ICD-10-CM | POA: Insufficient documentation

## 2023-02-04 DIAGNOSIS — F411 Generalized anxiety disorder: Secondary | ICD-10-CM | POA: Diagnosis not present

## 2023-02-04 DIAGNOSIS — R4189 Other symptoms and signs involving cognitive functions and awareness: Secondary | ICD-10-CM | POA: Insufficient documentation

## 2023-02-04 MED ORDER — LORAZEPAM 0.5 MG PO TABS: 0.5000 mg | ORAL_TABLET | Freq: Two times a day (BID) | ORAL | 0 refills | Status: DC | PRN

## 2023-02-04 MED ORDER — CYANOCOBALAMIN 1000 MCG/ML IJ SOLN
1000.0000 ug | Freq: Once | INTRAMUSCULAR | Status: AC
Start: 2023-02-04 — End: 2023-02-04
  Administered 2023-02-04: 1000 ug via INTRAMUSCULAR

## 2023-02-04 MED ORDER — ESCITALOPRAM OXALATE 10 MG PO TABS: 10.0000 mg | ORAL_TABLET | Freq: Every day | ORAL | 3 refills | Status: DC

## 2023-02-04 NOTE — Assessment & Plan Note (Signed)
Patient with progressive cognitive decline over the last several weeks to months.  Family is concerned about possible dementia.  We did some initial workup about a month ago so which was notable for low B12 however he has not had much change with B12 replacement protocol.  Will place referral to neurology for further evaluation.  Will also place referral to home health for home health nursing and home health aide to monitor status and evaluate home conditions.

## 2023-02-04 NOTE — Assessment & Plan Note (Signed)
B12 injection given today.  Recheck B12 in a couple of months.

## 2023-02-04 NOTE — Assessment & Plan Note (Signed)
Anxiety is not controlled and he is now using Ativan regularly daily.  He previously was only using very sparingly.  We discussed that Ativan is not ideal for long-term control of his anxiety and he would be reasonable to explore other treatment options at this point.  He is agreeable to this.  We will start Lexapro 10 mg daily.  We discussed potential side effects.  He will continue taking the Ativan daily as needed with the hope of gradually needing less once the Lexapro takes effect over the next several weeks.  He will follow-up with me in about a month or so and we can adjust medications as needed.  May consider trial of alternative SSRI or trial of BuSpar if he does not do well with the Lexapro.  We did discuss potential complications of long-term Ativan use and as above recommended that he gradually use less of this over the next several weeks.

## 2023-02-04 NOTE — Patient Instructions (Signed)
It was very nice to see you today!  Please start the Lexapro.  It will take a few weeks for this to build up in your system however that should help you with anxiety and help to use Ativan less frequently.  I will refer you to see the neurologist to evaluate your cognitive function.  Will also refer you to home health to have assistance at home.  Will give your B12 shot today.  Return in about 1 month (around 03/06/2023).   Take care, Dr [name]  PLEASE NOTE:  If you had any lab tests, please let us know if you have not heard back within a few days. You may see your results on mychart before we have a chance to review them but we will give you a call once they are reviewed by [place].   If we ordered any referrals today, please let us know if you have not heard from their office within the next week.   If you had any urgent prescriptions sent in today, please check with the pharmacy within an hour of our visit to make sure the prescription was transmitted appropriately.   Please try these tips to maintain a healthy lifestyle:  Eat at least 3 REAL meals and 1-2 snacks per day.  Aim for no more than 5 hours between eating.  If you eat breakfast, please do so within one hour of getting up.   Each meal should contain half fruits/vegetables, one quarter protein, and one quarter carbs (no bigger than a computer mouse)  Cut down on sweet beverages. This includes juice, soda, and sweet tea.   Drink at least 1 glass of water with each meal and aim for at least 8 glasses per day  Exercise at least 150 minutes every week.

## 2023-02-04 NOTE — Progress Notes (Signed)
[name] is a 82 y.o. male who presents today for an office visit.  Assessment/Plan:  Chronic Problems Addressed Today: Anxiety state Anxiety is not controlled and he is now using Ativan regularly daily.  He previously was only using very sparingly.  We discussed that Ativan is not ideal for long-term control of his anxiety and he would be reasonable to explore other treatment options at this point.  He is agreeable to this.  We will start Lexapro 10 mg daily.  We discussed potential side effects.  He will continue taking the Ativan daily as needed with the hope of gradually needing less once the Lexapro takes effect over the next several weeks.  He will follow-up with me in about a month or so and we can adjust medications as needed.  May consider trial of alternative SSRI or trial of BuSpar if he does not do well with the Lexapro.  We did discuss potential complications of long-term Ativan use and as above recommended that he gradually use less of this over the next several weeks.  Cognitive impairment Patient with progressive cognitive decline over the last several weeks to months.  Family is concerned about possible dementia.  We did some initial workup about a month ago so which was notable for low B12 however he has not had much change with B12 replacement protocol.  Will place referral to neurology for further evaluation.  Will also place referral to home health for home health nursing and home health aide to monitor status and evaluate home conditions.   B12 deficiency B12 injection given today.  Recheck B12 in a couple of months.     Subjective:  HPI:  Patient here today for ED follow-up.  Went to the ED a week ago with ongoing confusion and cognitive decline.  This is been going on for several weeks.  In the ED had extensive workup including head CT which was negative.  Chest x-ray was negative.  Labs were normal.  We did see him for similar concern about a month ago or so.   At that time we checked labs which did show low B12 and low vitamin D.  We started him on the B12 replacement protocol.  He has been getting weekly injections for the last month or so.  He has been getting weekly injections for the last month or so.  They have not noticed that this has made any significant change in his cognitive function.  He is here with his daughter today.  Still has ongoing cognitive issues.  Seems to be steadily declining the last several weeks.  They have family members staying at his house most of the time however I do feel like he could have some extra support at home.  Is having a lot more fatigue and brain fog as well.  Is also concerned about worsening anxiety.  This also seems to be worsening the last couple of weeks.  He has been using Ativan twice daily.  Was previously using only very sparingly as needed.  States that he wakes up and feels anxious.  No obvious precipitating events for his anxiety.  Ativan does help control symptoms.  He has not had any significant side effects with Ativan.       Objective:  Physical Exam: BP 92/60   Pulse 63   Temp 98.4 F (36.9 C) (Temporal)   Ht 6' (1.829 m)   Wt 197 lb 12.8 oz (89.7 kg)   SpO2 98%  BMI 26.83 kg/m   Gen: No acute distress, resting comfortably CV: Regular rate and rhythm with no murmurs appreciated Pulm: Normal work of breathing, clear to auscultation bilaterally with no crackles, wheezes, or rhonchi Neuro: Grossly normal, moves all extremities Psych: Normal affect and thought content  Time Spent: 45 minutes of total time was spent on the date of the encounter performing the following actions: chart review prior to seeing the patient including recent Emergency Department visit, obtaining history, performing a medically necessary exam, counseling on the treatment plan, placing orders, and documenting in our EHR.        Katina Degree. [name], MD 02/04/2023 2:04 PM

## 2023-02-05 ENCOUNTER — Telehealth: Payer: Self-pay | Admitting: Family Medicine

## 2023-02-05 ENCOUNTER — Other Ambulatory Visit: Payer: Self-pay | Admitting: *Deleted

## 2023-02-05 NOTE — Telephone Encounter (Signed)
Home Health Verbal Orders  Agency:  [facility]  Caller:  Archie Patten  Reason for Request:  Will Dr [name] be attending of records and will he give certificate of terminal illness to pt  Frequency:    HH needs F2F w/in last 30 days      [phone]

## 2023-02-05 NOTE — Telephone Encounter (Signed)
Can we clarify? We did not refer him to hospice services.  Katina Degree. [name], MD 02/05/2023 1:39 PM

## 2023-02-05 NOTE — Telephone Encounter (Signed)
See note

## 2023-02-05 NOTE — Telephone Encounter (Signed)
Spoke with Alinda Money, stated daughter spoke with a hospice nurse and nurse send a referral  Stating she is exhausted, unable to sleep and need help with patient care

## 2023-02-05 NOTE — Telephone Encounter (Signed)
We have referred him to home health to assist with patient care.  I do not think he would meet criteria for hospice but if family wishes to go forward with the consult then that is fine.  Katina Degree. [name], MD 02/05/2023 3:00 PM

## 2023-02-06 NOTE — Telephone Encounter (Signed)
[name] with [facility] requests to be called at ph# [phone] for status of previous request re:  " Reason for Request:  Will Dr [name] be attending of records and will he give certificate of terminal illness to pt"

## 2023-02-06 NOTE — Telephone Encounter (Signed)
Spoke with [name] informed  Per Dr [name]  We have referred him to home health to assist with patient care. I do not think he would meet criteria for hospice but if family wishes to go forward with the consult then that is fine.

## 2023-02-07 DIAGNOSIS — E538 Deficiency of other specified B group vitamins: Secondary | ICD-10-CM | POA: Diagnosis not present

## 2023-02-07 DIAGNOSIS — Z7984 Long term (current) use of oral hypoglycemic drugs: Secondary | ICD-10-CM | POA: Diagnosis not present

## 2023-02-07 DIAGNOSIS — L97421 Non-pressure chronic ulcer of left heel and midfoot limited to breakdown of skin: Secondary | ICD-10-CM | POA: Diagnosis not present

## 2023-02-07 DIAGNOSIS — F411 Generalized anxiety disorder: Secondary | ICD-10-CM | POA: Diagnosis not present

## 2023-02-07 DIAGNOSIS — E11621 Type 2 diabetes mellitus with foot ulcer: Secondary | ICD-10-CM | POA: Diagnosis not present

## 2023-02-07 DIAGNOSIS — Z7901 Long term (current) use of anticoagulants: Secondary | ICD-10-CM | POA: Diagnosis not present

## 2023-02-08 ENCOUNTER — Telehealth: Payer: Self-pay | Admitting: Family Medicine

## 2023-02-08 NOTE — Telephone Encounter (Signed)
Home Health Verbal Orders  Agency:  [facility]  Caller: Stacy  Reason for call:  Discharged from Hospice and moved to palliative care. FYI  Frequency:    HH needs F2F w/in last 30 days     [phone]

## 2023-02-08 NOTE — Telephone Encounter (Signed)
Please advise 

## 2023-02-08 NOTE — Telephone Encounter (Signed)
Ok with me. Please place any necessary orders.

## 2023-02-11 ENCOUNTER — Encounter: Payer: Self-pay | Admitting: Family Medicine

## 2023-02-11 ENCOUNTER — Other Ambulatory Visit: Payer: Self-pay | Admitting: *Deleted

## 2023-02-11 ENCOUNTER — Encounter: Payer: Self-pay | Admitting: Physician Assistant

## 2023-02-11 DIAGNOSIS — R4189 Other symptoms and signs involving cognitive functions and awareness: Secondary | ICD-10-CM

## 2023-02-11 NOTE — Telephone Encounter (Signed)
neurology Referral order

## 2023-02-12 ENCOUNTER — Encounter: Payer: Self-pay | Admitting: Family Medicine

## 2023-02-12 NOTE — Telephone Encounter (Signed)
Noted  

## 2023-02-13 NOTE — Telephone Encounter (Signed)
See note

## 2023-02-14 NOTE — Telephone Encounter (Signed)
Noted. Appreciate the update. We can discuss more at his visit.  Katina Degree. [name], MD 02/14/2023 1:52 PM

## 2023-02-15 ENCOUNTER — Encounter (HOSPITAL_COMMUNITY): Payer: Self-pay | Admitting: Emergency Medicine

## 2023-02-15 ENCOUNTER — Emergency Department (HOSPITAL_COMMUNITY)
Admission: EM | Admit: 2023-02-15 | Discharge: 2023-02-15 | Disposition: A | Discharge status: Home / Self Care | Payer: Medicare Other | Attending: Emergency Medicine | Admitting: Emergency Medicine

## 2023-02-15 ENCOUNTER — Ambulatory Visit (INDEPENDENT_AMBULATORY_CARE_PROVIDER_SITE_OTHER): Payer: Medicare Other | Admitting: Family Medicine

## 2023-02-15 ENCOUNTER — Encounter: Payer: Self-pay | Admitting: Family Medicine

## 2023-02-15 ENCOUNTER — Other Ambulatory Visit: Payer: Self-pay

## 2023-02-15 ENCOUNTER — Emergency Department (HOSPITAL_COMMUNITY): Payer: Medicare Other

## 2023-02-15 VITALS — BP 108/62 | HR 54 | Temp 97.7°F | Ht 72.0 in | Wt 197.6 lb

## 2023-02-15 DIAGNOSIS — R001 Bradycardia, unspecified: Secondary | ICD-10-CM | POA: Diagnosis not present

## 2023-02-15 DIAGNOSIS — Z7901 Long term (current) use of anticoagulants: Secondary | ICD-10-CM | POA: Diagnosis not present

## 2023-02-15 DIAGNOSIS — R0789 Other chest pain: Secondary | ICD-10-CM | POA: Diagnosis not present

## 2023-02-15 DIAGNOSIS — R079 Chest pain, unspecified: Secondary | ICD-10-CM | POA: Diagnosis not present

## 2023-02-15 DIAGNOSIS — R0989 Other specified symptoms and signs involving the circulatory and respiratory systems: Secondary | ICD-10-CM | POA: Diagnosis not present

## 2023-02-15 LAB — TROPONIN I (HIGH SENSITIVITY)
Troponin I (High Sensitivity): 5 ng/L (ref <18)
Troponin I (High Sensitivity): 5 ng/L (ref <18)

## 2023-02-15 LAB — CBC
HCT: 34.3 % — ABNORMAL LOW (ref 39.0–52.0)
Hemoglobin: 11.2 g/dL — ABNORMAL LOW (ref 13.0–17.0)
MCH: 28.3 pg (ref 26.0–34.0)
MCHC: 32.7 g/dL (ref 30.0–36.0)
MCV: 86.6 fL (ref 80.0–100.0)
Platelets: 219 10*3/uL (ref 150–400)
RBC: 3.96 MIL/uL — ABNORMAL LOW (ref 4.22–5.81)
RDW: 13.3 % (ref 11.5–15.5)
WBC: 6.2 10*3/uL (ref 4.0–10.5)
nRBC: 0 % (ref 0.0–0.2)

## 2023-02-15 LAB — BASIC METABOLIC PANEL
Anion gap: 8 (ref 5–15)
BUN: 14 mg/dL (ref 8–23)
CO2: 21 mmol/L — ABNORMAL LOW (ref 22–32)
Calcium: 8.6 mg/dL — ABNORMAL LOW (ref 8.9–10.3)
Chloride: 101 mmol/L (ref 98–111)
Creatinine, Ser: 1.08 mg/dL (ref 0.61–1.24)
GFR, Estimated: >60 mL/min (ref >60)
Glucose, Bld: 100 mg/dL — ABNORMAL HIGH (ref 70–99)
Potassium: 4.5 mmol/L (ref 3.5–5.1)
Sodium: 130 mmol/L — ABNORMAL LOW (ref 135–145)

## 2023-02-15 NOTE — Progress Notes (Signed)
   [name] is a 82 y.o. male who presents today for an office visit.  Assessment/Plan:  Chest Pain Patient with sudden onset chest pain at 7 AM this morning.  He has known coronary artery disease.  Discussed with patient that we need to have him evaluated emergently to rule out acute coronary syndrome.  Unable to perform EKG in the office today as unable to get him onto the exam table however his cardiac exam was stable.  Patient agreed to going to the ED. Call was placed and patient left the clinic via EMS.     Subjective:  HPI:  During rooming process patient informed medical assistant that he had been experiencing substernal chest pain since 7 AM.  I was called into the room. Patient sitting in chair without any acute distress.  We were unable to get patient table for further examination or EKG due to weakness.  Nondiaphoretic. Normal cardiac physical exam. Stated that chest pain had been present for the last few hours and has been consistent since then  Apparently his daughter told him to call 911 multiple times this morning however he refused to do so. He wanted to see [place] first before going to the emergency room.       Objective:  Physical Exam: BP 108/62   Pulse (!) 54   Temp 97.7 F (36.5 C) (Temporal)   Ht 6' (1.829 m)   Wt 197 lb 9.6 oz (89.6 kg)   SpO2 99%   BMI 26.80 kg/m   Gen: No acute distress, resting comfortably CV: Regular rate and rhythm with no murmurs appreciated Pulm: Normal work of breathing, clear to auscultation bilaterally with no crackles, wheezes, or rhonchi Neuro: Grossly normal, moves all extremities Psych: Normal affect and thought content      Brettany Sydney M. [name], MD 02/15/2023 11:38 AM

## 2023-02-15 NOTE — ED Triage Notes (Signed)
Per [facility] pt coming from PCP c/o intermittent chest pain since 7am this morning. Pain to left side and is non radiating. Took home meds before going to PCP. Was told by PCP to not taken aspirin so none given PTA. No pain at this moment.

## 2023-02-15 NOTE — Discharge Instructions (Addendum)
Follow-up with the heart doctor as listed above, ER for worsening symptoms, all of your test have been reassuring and showed no signs of heart attack, ER for worsening symptoms

## 2023-02-15 NOTE — ED Provider Notes (Signed)
Trop neg X 2 Atypical sounding pain VS normal EKG without acute findings Stable for d/c  D/w pt and family - they are agreeable to f/u outpt.   Eber Hong, MD 02/15/23 [phone]

## 2023-02-15 NOTE — ED Provider Notes (Signed)
[place] EMERGENCY DEPARTMENT AT [facility] Provider Note   CSN: [id number] Arrival date & time: 02/15/23  1252     History  Chief Complaint  Patient presents with   Chest Pain    [name] is a 82 y.o. male.  HPI 82 year old male presents with chest pain.  Sent from his PCP office.  Has had intermittent chest pain since 7 AM.  Is on the left side of his chest and he describes it like someone has hit him in the chest but it only lasts for a few seconds and then if he rubs his chest it goes away.  Has come and gone.  No abdominal pain, back pain, shortness of breath, or leg swelling.  Right now he is asymptomatic.  Home Medications Prior to Admission medications   Medication Sig Start Date End Date Taking? Authorizing Provider  Accu-Chek Softclix Lancets lancets Use to check blood sugar three times a day. 11/16/21   Ardith Dark, MD  acetaminophen (TYLENOL) 500 MG tablet Take 500-1,000 mg by mouth every 6 (six) hours as needed (pain).    [provider]  apixaban (ELIQUIS) 5 MG TABS tablet Take 1 tablet (5 mg total) by mouth 2 (two) times daily. 09/17/22   Hilty, Lisette Abu, MD  carvedilol (COREG) 25 MG tablet Take 1 tablet (25 mg total) by mouth 2 (two) times daily. 01/23/23   Hilty, Lisette Abu, MD  doxycycline (VIBRA-TABS) 100 MG tablet Take 1 tablet (100 mg total) by mouth 2 (two) times daily. 01/17/23   Nadara Mustard, MD  escitalopram (LEXAPRO) 10 MG tablet Take 1 tablet (10 mg total) by mouth daily. 02/04/23   Ardith Dark, MD  furosemide (LASIX) 20 MG tablet Take 2 tablets (40 mg total) by mouth daily. 08/03/22   Hilty, Lisette Abu, MD  gabapentin (NEURONTIN) 100 MG capsule Take 1 capsule (100 mg total) by mouth 3 (three) times daily. TAKE 1 CAPSULE BY MOUTH THREE TIMES A DAY Strength: 100 mg 12/31/22   Ardith Dark, MD  glucose blood (ACCU-CHEK AVIVA PLUS) test strip Use 3 times daily. Morning, noon, and nightly to check glucose levels Dx: E11.9 (type 2  diabetes mellitus) 11/17/21   Ardith Dark, MD  halobetasol (ULTRAVATE) 0.05 % cream APPLY TO AFFECTED AREA TWICE A DAY 01/08/22   Ardith Dark, MD  LORazepam (ATIVAN) 0.5 MG tablet Take 1 tablet (0.5 mg total) by mouth 2 (two) times daily as needed for anxiety. 02/04/23   Ardith Dark, MD  metFORMIN (GLUCOPHAGE) 1000 MG tablet TAKE 1 TABLET BY MOUTH EVERY DAY WITH BREAKFAST 11/26/22   Ardith Dark, MD  nystatin cream (MYCOSTATIN) Apply 1 Application topically 2 (two) times daily as needed (skin irritation). 08/02/22   Ardith Dark, MD  pantoprazole (PROTONIX) 40 MG tablet TAKE 1 TABLET BY MOUTH EVERY DAY 11/26/22   Ardith Dark, MD  potassium chloride SA (KLOR-CON M20) 20 MEQ tablet TAKE 2 TABLETS BY MOUTH EVERY MORNING 01/29/23   Ardith Dark, MD  sacubitril-valsartan (ENTRESTO) 49-51 MG Take 1 tablet by mouth 2 (two) times daily. 08/20/22   Chrystie Nose, MD  sildenafil (VIAGRA) 100 MG tablet TAKE 1 TABLET DAILY AS NEEDED ED 12/31/22   Ardith Dark, MD  solifenacin (VESICARE) 10 MG tablet TAKE 1 TABLET BY MOUTH EVERY DAY 01/29/23   Ardith Dark, MD  tamsulosin (FLOMAX) 0.4 MG CAPS capsule TAKE 1 CAPSULE BY MOUTH EVERY DAY 02/27/22  Chrystie Nose, MD  Vitamin D, Ergocalciferol, (DRISDOL) 1.25 MG (50000 UNIT) CAPS capsule Take 1 capsule (50,000 Units total) by mouth every 7 (seven) days. 01/01/23   Ardith Dark, MD      Allergies    Lisinopril    Review of Systems   Review of Systems  Respiratory:  Negative for shortness of breath.   Cardiovascular:  Positive for chest pain. Negative for leg swelling.  Gastrointestinal:  Negative for abdominal pain.  Musculoskeletal:  Negative for back pain.    Physical Exam Updated Vital Signs BP 138/76   Pulse (!) 53   Temp 97.7 F (36.5 C) (Oral)   Resp 20   Ht 6' (1.829 m)   Wt 89.4 kg   SpO2 98%   BMI 26.72 kg/m  Physical Exam Vitals and nursing note reviewed.  Constitutional:      General: He is not in acute distress.     Appearance: He is well-developed. He is not ill-appearing or diaphoretic.  HENT:     Head: Normocephalic and atraumatic.  Cardiovascular:     Rate and Rhythm: Regular rhythm. Bradycardia present.     Heart sounds: Normal heart sounds.     Comments: HR in 50s Pulmonary:     Effort: Pulmonary effort is normal.     Breath sounds: Normal breath sounds.  Chest:     Chest wall: No tenderness.  Abdominal:     Palpations: Abdomen is soft.     Tenderness: There is no abdominal tenderness.  Skin:    General: Skin is warm and dry.  Neurological:     Mental Status: He is alert.     ED Results / Procedures / Treatments   Labs (all labs ordered are listed, but only abnormal results are displayed) Labs Reviewed  BASIC METABOLIC PANEL  CBC  TROPONIN I (HIGH SENSITIVITY)    EKG EKG Interpretation Date/Time:  Friday February 15 2023 13:02:19 EDT Ventricular Rate:  56 PR Interval:  178 QRS Duration:  83 QT Interval:  441 QTC Calculation: 426 R Axis:   15  Text Interpretation: Sinus rhythm Probable left atrial enlargement Abnormal R-wave progression, early transition Confirmed by Pricilla Loveless [phone]) on 02/15/2023 1:10:24 PM  Radiology No results found.  Procedures Procedures    Medications Ordered in ED Medications - No data to display  ED Course/ Medical Decision Making/ A&P                                 Medical Decision Making Amount and/or Complexity of Data Reviewed Labs: ordered.    Details: First troponin negative Radiology: ordered and independent interpretation performed.    Details: No pneumothorax ECG/medicine tests: ordered and independent interpretation performed.    Details: No ischemia   Patient's chest pain is quite atypical.  Given this, we will get a second troponin but with no acute ischemia on EKG and low suspicion that this is ACS, PE, dissection, etc., I think he can be discharged of his second troponin is negative.  Otherwise, his chest  x-ray is likely from poor inspiration rather than CHF based on history/exam.  Care transferred to Dr. Hyacinth Meeker with second troponin pending.  Of note, his hemoglobin is mildly lower than last month but he reports no bleeding.  I discussed that he can follow-up with his PCP to get this rechecked but I do not think this is a clinically significant drop at this  time.        Final Clinical Impression(s) / ED Diagnoses Final diagnoses:  None    Rx / DC Orders ED Discharge Orders     None         Pricilla Loveless, MD 02/15/23 [phone]

## 2023-02-18 ENCOUNTER — Other Ambulatory Visit: Payer: Self-pay | Admitting: Family Medicine

## 2023-02-18 MED ORDER — LORAZEPAM 0.5 MG PO TABS: 0.5000 mg | ORAL_TABLET | Freq: Two times a day (BID) | ORAL | 0 refills | Status: DC | PRN

## 2023-02-21 ENCOUNTER — Telehealth: Payer: Self-pay | Admitting: *Deleted

## 2023-02-21 NOTE — Progress Notes (Signed)
Care Coordination   Note   02/21/2023 Name: [name] MRN: [id number] DOB: 1941-03-31  [name] is a 82 y.o. year old male who sees Ardith Dark, MD for primary care. I reached out to [name] by phone today to offer care coordination services.  [name] was given information about Care Coordination services today including:   The Care Coordination services include support from the care team which includes your Nurse Coordinator, Clinical Social Worker, or Pharmacist.  The Care Coordination team is here to help remove barriers to the health concerns and goals most important to you. Care Coordination services are voluntary, and the patient may decline or stop services at any time by request to their care team member.   Care Coordination Consent Status: Patient did not agree to participate in care coordination services at this time.  Follow up plan:  none indicated - pt declines need for social worker at this time   Encounter Outcome:  Patient Refused  Burman Nieves, [facility] Care Coordination Care Guide Direct Dial: [phone]

## 2023-02-22 ENCOUNTER — Encounter: Payer: Self-pay | Admitting: Family Medicine

## 2023-02-25 ENCOUNTER — Ambulatory Visit: Payer: Medicare Other | Admitting: Family Medicine

## 2023-02-26 NOTE — Telephone Encounter (Signed)
LVM form ready to be pick up  Placed at front office Copy placed to be scan in patient chart

## 2023-02-26 NOTE — Telephone Encounter (Signed)
Form placed to be reviewed in PCP office

## 2023-02-27 ENCOUNTER — Encounter: Payer: Self-pay | Admitting: Family Medicine

## 2023-02-27 ENCOUNTER — Ambulatory Visit (INDEPENDENT_AMBULATORY_CARE_PROVIDER_SITE_OTHER): Payer: Medicare Other | Admitting: Family Medicine

## 2023-02-27 VITALS — BP 97/57 | HR 64 | Temp 97.5°F | Ht 72.0 in | Wt 195.6 lb

## 2023-02-27 DIAGNOSIS — E1159 Type 2 diabetes mellitus with other circulatory complications: Secondary | ICD-10-CM | POA: Diagnosis not present

## 2023-02-27 DIAGNOSIS — I152 Hypertension secondary to endocrine disorders: Secondary | ICD-10-CM | POA: Diagnosis not present

## 2023-02-27 DIAGNOSIS — M549 Dorsalgia, unspecified: Secondary | ICD-10-CM

## 2023-02-27 DIAGNOSIS — R4189 Other symptoms and signs involving cognitive functions and awareness: Secondary | ICD-10-CM

## 2023-02-27 MED ORDER — BACLOFEN 10 MG PO TABS: 5.0000 mg | ORAL_TABLET | Freq: Two times a day (BID) | ORAL | 0 refills | Status: DC

## 2023-02-27 NOTE — Patient Instructions (Signed)
It was very nice to see you today!  You have a mild muscle strain.  Please take low-dose baclofen as needed.  Use a heating pad.  Let us know if not improving.  Please keep your appointment with neurology next week.  Return if symptoms worsen or fail to improve.   Take care, Dr [name]  PLEASE NOTE:  If you had any lab tests, please let us know if you have not heard back within a few days. You may see your results on mychart before we have a chance to review them but we will give you a call once they are reviewed by [place].   If we ordered any referrals today, please let us know if you have not heard from their office within the next week.   If you had any urgent prescriptions sent in today, please check with the pharmacy within an hour of our visit to make sure the prescription was transmitted appropriately.   Please try these tips to maintain a healthy lifestyle:  Eat at least 3 REAL meals and 1-2 snacks per day.  Aim for no more than 5 hours between eating.  If you eat breakfast, please do so within one hour of getting up.   Each meal should contain half fruits/vegetables, one quarter protein, and one quarter carbs (no bigger than a computer mouse)  Cut down on sweet beverages. This includes juice, soda, and sweet tea.   Drink at least 1 glass of water with each meal and aim for at least 8 glasses per day  Exercise at least 150 minutes every week.

## 2023-02-27 NOTE — Assessment & Plan Note (Signed)
Blood pressure at goal on Entresto 24-26 twice daily and Coreg 25 mg twice daily.

## 2023-02-27 NOTE — Progress Notes (Signed)
   [name] is a 82 y.o. male who presents today for an office visit.  Assessment/Plan:  New/Acute Problems: Back Pain No red flags. Likely mild muscle strain/spasm.  We discussed home exercises.  He will try to be more active at home.  Will start very low-dose baclofen to use as needed.  Also recommended heating pad.  He can use Tylenol as needed.  We discussed reasons to return to care.  He will let us know if not improving in the next 1 to 2 weeks.  Chronic Problems Addressed Today: Cognitive impairment Has neurology appointment next week.  Family is concerned about possible dementia diagnosis.  Lab workup her a few months ago was notable for low B12 however he has not had any improvement with B12 replacement.  Hypertension associated with diabetes (HCC) Blood pressure at goal on Entresto 24-26 twice daily and Coreg 25 mg twice daily.     Subjective:  HPI:  Patient here with back pain.  See A/P for chronic conditions.   He is upset about not being able to drive. States that his daughter took his car keys and he has been sitting in a recliner "all day" because he has no where to go.  States that his daughter took his keys away because she was told that he should not drive until seeing the neurologist.  He wants to be able to drive so that he can get out of the recliner and go places he needs to go.  He attributes his back pain to sitting in recliner all day.  Located on right side of his back.  Gets better when he gets up and moves around.  Has not tried any medications.  No heating pad.  No weakness or numbness.  Pain is very mild.        Objective:  Physical Exam: BP (!) 97/57   Pulse 64   Temp (!) 97.5 F (36.4 C) (Temporal)   Ht 6' (1.829 m)   Wt 195 lb 9.6 oz (88.7 kg)   SpO2 99%   BMI 26.53 kg/m   Gen: No acute distress, resting comfortably CV: Regular rate and rhythm with no murmurs appreciated Pulm: Normal work of breathing, clear to auscultation bilaterally  with no crackles, wheezes, or rhonchi MUSCULOSKELETAL:  - Back: No deformities.  Tenderness to palpation along right posterior flank.  No midline tenderness. Neuro: Grossly normal, moves all extremities Psych: Normal affect and thought content      [name] M. [name], MD 02/27/2023 10:27 AM

## 2023-02-27 NOTE — Assessment & Plan Note (Signed)
Has neurology appointment next week.  Family is concerned about possible dementia diagnosis.  Lab workup her a few months ago was notable for low B12 however he has not had any improvement with B12 replacement.

## 2023-03-01 NOTE — Progress Notes (Signed)
Assessment/Plan:     [name] is a very pleasant 82 y.o. year old RH male with a history of hypertension, hyperlipidemia, CSCHF, NICM, Afib on Eliquis  DM2 with neuropathy, OA, GERD, B12 deficiency, anxiety, seen today for evaluation of memory loss. MoCA today is 14/30  (poor effort).  Workup is in progress. Patient is able to participate on his ADLs  to his ability and no longer driving after not getting to destination on a recent short distance trip. Workup is in progress.   Memory Impairment  MRI brain without contrast to assess for underlying structural abnormality and assess vascular load  Continue B12 supplements  Start Memantine 5 mg: Take 1 tablet (5 mg at night) for 2 weeks, then increase to 1 tablet (5 mg) twice a day, side effects discussed (bradycardia)    Neurocognitive testing to further evaluate cognitive concerns and determine other underlying cause of memory changes, including potential contribution from sleep, anxiety, attention, or depression among others  Replenish B12 (143).  Recommend good control of cardiovascular risk factors.  Patient is on Eliquis Continue to control mood as per PCP [place] up in 3 months   Subjective:    The patient is accompanied by his daughter who supplements the history.    How long did patient have memory difficulties?  For about 8-9 months according to daughter.  Patient reports some difficulty remembering new information, recent conversations, names, places that he had been. repeats oneself?  Endorsed, especially with appointments.  Disoriented when walking into a room?  Patient denies    Leaving objects in unusual places?  Endorsed. His daughter found the Phone in the dry clean pile.   Wandering behavior? Denies.    Any personality changes, or depression, anxiety? Gets frustrated easier, more anxious which is  "not normal for him, his temper changes are unusual" Hallucinations or paranoia? Denies.    Seizures? denies    Any  sleep changes?  Sleeps well.  He reports vivid dreams, denies REM behavior or sleepwalking   Sleep apnea? Denies.   Any hygiene concerns?  Denies.   Independent of bathing and dressing? Endorsed.  Does the patient need help with medications? Daughter is in charge . He was under and overtaking them, so 1 month ago his daughter took over  Who is in charge of the finances? Daughter  is in charge as he was missing the bills.    Any changes in appetite?  It is decreased, drinks 1 glass of water, "but drinks a lot of coffee and Pepsi"  Patient have trouble swallowing?  Denies.   Does the patient cook? No  Any headaches?  Denies.   Chronic pain?  Chronic low back pain (OA).   Ambulates with difficulty? 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  Needs a walker to ambulate for stability.   Recent falls or head injuries?  1 mo ago, "didn't land on the chair, but did not hurt myself".    Vision changes?  Denies any new issues.    Any strokelike symptoms? Denies.   Any tremors? "Only when anxious". Any anosmia? Denies.   Any incontinence of urine? Denies.   Any bowel dysfunction? Denies.      Patient lives alone. They are looking into  I.L vs ALF  History of heavy alcohol intake? Denies.   History of heavy tobacco use? Denies.   Family history of dementia? Denies  Does patient drive?" Not lately". His  daughter too the keys after recent presentation to the ED for CP. "He is very unhappy about it.  He got lost once, and instead of running a short errand,  when he ended up in [employer]".  Retired Emergency planning/management officer   Allergies  Allergen Reactions   Lisinopril Cough    Current Outpatient Medications  Medication Instructions   Accu-Chek Softclix Lancets lancets Use to check blood sugar three times a day.   acetaminophen (TYLENOL) 500-1,000 mg, Oral, Every 6 hours PRN   apixaban (ELIQUIS) 5 mg, Oral, 2 times daily   baclofen (LIORESAL) 5 mg, Oral, 2 times daily    carvedilol (COREG) 25 mg, Oral, 2 times daily   doxycycline (VIBRA-TABS) 100 mg, Oral, 2 times daily   escitalopram (LEXAPRO) 10 mg, Oral, Daily   furosemide (LASIX) 40 mg, Oral, Daily   gabapentin (NEURONTIN) 100 mg, Oral, 3 times daily, TAKE 1 CAPSULE BY MOUTH THREE TIMES A DAY Strength: 100 mg   glucose blood (ACCU-CHEK AVIVA PLUS) test strip Use 3 times daily. Morning, noon, and nightly to check glucose levels Dx: E11.9 (type 2 diabetes mellitus)   halobetasol (ULTRAVATE) 0.05 % cream APPLY TO AFFECTED AREA TWICE A DAY   LORazepam (ATIVAN) 0.5 mg, Oral, 2 times daily PRN   memantine (NAMENDA) 5 MG tablet Take 1 tablet (5 mg at night) for 2 weeks, then increase to 1 tablet (5 mg) twice a day   metFORMIN (GLUCOPHAGE) 1000 MG tablet TAKE 1 TABLET BY MOUTH EVERY DAY WITH BREAKFAST   nystatin cream (MYCOSTATIN) 1 Application, Topical, 2 times daily PRN   pantoprazole (PROTONIX) 40 mg, Oral, Daily   potassium chloride SA (KLOR-CON M20) 20 MEQ tablet TAKE 2 TABLETS BY MOUTH EVERY MORNING   sacubitril-valsartan (ENTRESTO) 49-51 MG 1 tablet, Oral, 2 times daily   solifenacin (VESICARE) 10 mg, Oral, Daily   tamsulosin (FLOMAX) 0.4 MG CAPS capsule TAKE 1 CAPSULE BY MOUTH EVERY DAY   Vitamin D (Ergocalciferol) (DRISDOL) 50,000 Units, Oral, Every 7 days     VITALS:   Vitals:   03/04/23 1318  BP: 108/60  Pulse: 78  Resp: 18  SpO2: 98%  Weight: 196 lb (88.9 kg)  Height: 6' (1.829 m)    PHYSICAL EXAM   HEENT:  Normocephalic, atraumatic.  The superficial temporal arteries are without ropiness or tenderness. Cardiovascular: Regular rate and rhythm. Lungs: Clear to auscultation bilaterally. Neck: There are no carotid bruits noted bilaterally.  NEUROLOGICAL:    03/04/2023    1:00 PM  Montreal Cognitive Assessment   Visuospatial/ Executive (0/5) 2  Naming (0/3) 3  Attention: Read list of digits (0/2) 2  Attention: Read list of letters (0/1) 0  Attention: Serial 7 subtraction starting at  100 (0/3) 1  Language: Repeat phrase (0/2) 0  Language : Fluency (0/1) 0  Abstraction (0/2) 1  Delayed Recall (0/5) 1  Orientation (0/6) 4  Total 14  Adjusted Score (based on education) 14        No data to display           Orientation:  Alert and oriented to person, place and not to time . No aphasia or dysarthria. Fund of knowledge is appropriate. Recent and remote memory impaired.  Attention and concentration are reduced.  Able to name objects and unable to repeat phrases . Delayed recall  1/5   Cranial nerves: There is good facial symmetry. Extraocular muscles are intact and visual fields are full to confrontational testing. Speech is fluent and  clear. No tongue deviation. Hearing is intact to conversational tone.  Tone: Tone is good throughout. Sensation: Sensation is intact to light touch.  Vibration is intact at the bilateral big toe L, BKA R .  Coordination: The patient has no difficulty with RAM's or FNF bilaterally. Normal finger to nose  Motor: Strength is 5/5 in the bilateral upper and lower extremities. There is no pronator drift. There are no fasciculations noted. DTR's: Deep tendon reflexes are 2/4 bilaterally. Gait and Station: The patient is able to ambulate with some difficulty. Gait is cautious and narrow. Stride length is normal, as a prosthesis on the RLE     Thank you for allowing [place] the opportunity to participate in the care of this nice patient. Please do not hesitate to contact us for any questions or concerns.   Total time spent on today's visit was 45 minutes dedicated to this patient today, preparing to see patient, examining the patient, ordering tests and/or medications and counseling the patient, documenting clinical information in the EHR or other health record, independently interpreting results and communicating results to the patient/family, discussing treatment and goals, answering patient's questions and coordinating care.  Cc:  Ardith Dark,  MD  Marlowe Kays 03/04/2023 2:32 PM

## 2023-03-04 ENCOUNTER — Ambulatory Visit: Payer: Medicare Other

## 2023-03-04 ENCOUNTER — Encounter: Payer: Self-pay | Admitting: Physician Assistant

## 2023-03-04 ENCOUNTER — Ambulatory Visit (INDEPENDENT_AMBULATORY_CARE_PROVIDER_SITE_OTHER): Payer: Medicare Other | Admitting: Physician Assistant

## 2023-03-04 VITALS — BP 108/60 | HR 78 | Resp 18 | Ht 72.0 in | Wt 196.0 lb

## 2023-03-04 DIAGNOSIS — R413 Other amnesia: Secondary | ICD-10-CM

## 2023-03-04 MED ORDER — MEMANTINE HCL 5 MG PO TABS
ORAL_TABLET | ORAL | 11 refills | Status: DC
Start: 2023-03-04 — End: 2023-03-26

## 2023-03-04 NOTE — Patient Instructions (Signed)
It was a pleasure to see you today at our office.   Recommendations:  MRI of the brain, the radiology office will call you to arrange you appointment  Start Memantine 5 mg: Take 1 tablet (5 mg at night) for 2 weeks, then increase to 1 tablet (5 mg) twice a day   Follow up in 3  months No driving  Recommend visiting the website : " Dementia [facility]" to better understand some behaviors related to memory loss.    For psychiatric meds, mood meds: Please have your primary care physician manage these medications.  If you have any severe symptoms of a stroke, or other severe issues such as confusion,severe chills or fever, etc call 911 or go to the ER as you may need to be evaluated further   For guidance regarding [facility] and if placement were needed at the facility, contact Social Worker tel: [phone]  For assessment of decision of mental capacity and competency:  Call Dr. Erick Blinks, geriatric psychiatrist at [phone]  Counseling regarding caregiver distress, including caregiver depression, anxiety and issues regarding community resources, adult day care programs, adult living facilities, or memory care questions:  please contact your  Primary Doctor's Social Worker   Whom to call: Memory  decline, memory medications: Call our office [phone]    [email or web address]   RECOMMENDATIONS FOR ALL PATIENTS WITH MEMORY PROBLEMS: 1. Continue to exercise (Recommend 30 minutes of walking everyday, or 3 hours every week) 2. Increase social interactions - continue going to [place] and enjoy social gatherings with friends and family 3. Eat healthy, avoid fried foods and eat more fruits and vegetables 4. Maintain adequate blood pressure, blood sugar, and blood cholesterol level. Reducing the risk of stroke and cardiovascular disease also helps promoting better memory. 5. Avoid stressful situations. Live a  simple life and avoid aggravations. Organize your time and prepare for the next day in anticipation. 6. Sleep well, avoid any interruptions of sleep and avoid any distractions in the bedroom that may interfere with adequate sleep quality 7. Avoid sugar, avoid sweets as there is a strong link between excessive sugar intake, diabetes, and cognitive impairment We discussed the Mediterranean diet, which has been shown to help patients reduce the risk of progressive memory disorders and reduces cardiovascular risk. This includes eating fish, eat fruits and green leafy vegetables, nuts like almonds and hazelnuts, walnuts, and also use olive oil. Avoid fast foods and fried foods as much as possible. Avoid sweets and sugar as sugar use has been linked to worsening of memory function.  There is always a concern of gradual progression of memory problems. If this is the case, then we may need to adjust level of care according to patient needs. Support, both to the patient and caregiver, should then be put into place.      You have been referred for a neuropsychological evaluation (i.e., evaluation of memory and thinking abilities). Please bring someone with you to this appointment if possible, as it is helpful for the doctor to hear from both you and another adult who knows you well. Please bring eyeglasses and hearing aids if you wear them.    The evaluation will take approximately 3 hours and has two parts:   The first part is a clinical interview with the neuropsychologist (Dr. Milbert Coulter or Dr. Roseanne Reno). During the interview, the neuropsychologist will speak with you and the individual you brought to the appointment.    The second part of the evaluation is  testing with the doctor's technician [name] or Selena Batten). During the testing, the technician will ask you to remember different types of material, solve problems, and answer some questionnaires. Your family member will not be present for this portion of the  evaluation.   Please note: We must reserve several hours of the neuropsychologist's time and the psychometrician's time for your evaluation appointment. As such, there is a No-Show fee of $100. If you are unable to attend any of your appointments, please contact our office as soon as possible to reschedule.      DRIVING: Regarding driving, in patients with progressive memory problems, driving will be impaired. We advise to have someone else do the driving if trouble finding directions or if minor accidents are reported. Independent driving assessment is available to determine safety of driving.   If you are interested in the driving assessment, you can contact the following:  [employer] in [place] [phone]  [facility] [phone]  [facility] [phone]  [facility] ([place]) [phone] or [phone]   FALL PRECAUTIONS: Be cautious when walking. Scan the area for obstacles that may increase the risk of trips and falls. When getting up in the mornings, sit up at the edge of the bed for a few minutes before getting out of bed. Consider elevating the bed at the head end to avoid drop of blood pressure when getting up. Walk always in a well-lit room (use night lights in the walls). Avoid area rugs or power cords from appliances in the middle of the walkways. Use a walker or a cane if necessary and consider physical therapy for balance exercise. Get your eyesight checked regularly.  FINANCIAL OVERSIGHT: Supervision, especially oversight when making financial decisions or transactions is also recommended.  HOME SAFETY: Consider the safety of the kitchen when operating appliances like stoves, microwave oven, and blender. Consider having supervision and share cooking responsibilities until no longer able to participate in those. Accidents with firearms and other hazards in the house should be identified and addressed as well.   ABILITY TO BE  LEFT ALONE: If patient is unable to contact 911 operator, consider using LifeLine, or when the need is there, arrange for someone to stay with patients. Smoking is a fire hazard, consider supervision or cessation. Risk of wandering should be assessed by caregiver and if detected at any point, supervision and safe proof recommendations should be instituted.  MEDICATION SUPERVISION: Inability to self-administer medication needs to be constantly addressed. Implement a mechanism to ensure safe administration of the medications.      Mediterranean Diet A Mediterranean diet refers to food and lifestyle choices that are based on the traditions of countries located on the [employer] [place]. This way of eating has been shown to help prevent certain conditions and improve outcomes for people who have chronic diseases, like kidney disease and heart disease. What are tips for following this plan? Lifestyle  Cook and eat meals together with your family, when possible. Drink enough fluid to keep your urine clear or pale yellow. Be physically active every day. This includes: Aerobic exercise like running or swimming. Leisure activities like gardening, walking, or housework. Get 7-8 hours of sleep each night. If recommended by your health care provider, drink red wine in moderation. This means 1 glass a day for nonpregnant women and 2 glasses a day for men. A glass of wine equals 5 oz (150 mL). Reading food labels  Check the serving size of packaged foods. For foods such as  rice and pasta, the serving size refers to the amount of cooked product, not dry. Check the total fat in packaged foods. Avoid foods that have saturated fat or trans fats. Check the ingredients list for added sugars, such as corn syrup. Shopping  At the grocery store, buy most of your food from the areas near the walls of the store. This includes: Fresh fruits and vegetables (produce). Grains, beans, nuts, and seeds. Some of these may  be available in unpackaged forms or large amounts (in bulk). Fresh seafood. Poultry and eggs. Low-fat dairy products. Buy whole ingredients instead of prepackaged foods. Buy fresh fruits and vegetables in-season from local farmers markets. Buy frozen fruits and vegetables in resealable bags. If you do not have access to quality fresh seafood, buy precooked frozen shrimp or canned fish, such as tuna, salmon, or sardines. Buy small amounts of raw or cooked vegetables, salads, or olives from the deli or salad bar at your store. Stock your pantry so you always have certain foods on hand, such as olive oil, canned tuna, canned tomatoes, rice, pasta, and beans. Cooking  Cook foods with extra-virgin olive oil instead of using butter or other vegetable oils. Have meat as a side dish, and have vegetables or grains as your main dish. This means having meat in small portions or adding small amounts of meat to foods like pasta or stew. Use beans or vegetables instead of meat in common dishes like chili or lasagna. Experiment with different cooking methods. Try roasting or broiling vegetables instead of steaming or sauteing them. Add frozen vegetables to soups, stews, pasta, or rice. Add nuts or seeds for added healthy fat at each meal. You can add these to yogurt, salads, or vegetable dishes. Marinate fish or vegetables using olive oil, lemon juice, garlic, and fresh herbs. Meal planning  Plan to eat 1 vegetarian meal one day each week. Try to work up to 2 vegetarian meals, if possible. Eat seafood 2 or more times a week. Have healthy snacks readily available, such as: Vegetable sticks with hummus. Greek yogurt. Fruit and nut trail mix. Eat balanced meals throughout the week. This includes: Fruit: 2-3 servings a day Vegetables: 4-5 servings a day Low-fat dairy: 2 servings a day Fish, poultry, or lean meat: 1 serving a day Beans and legumes: 2 or more servings a week Nuts and seeds: 1-2 servings a  day Whole grains: 6-8 servings a day Extra-virgin olive oil: 3-4 servings a day Limit red meat and sweets to only a few servings a month What are my food choices? Mediterranean diet Recommended Grains: Whole-grain pasta. Brown rice. Bulgar wheat. Polenta. Couscous. Whole-wheat bread. Orpah Cobb. Vegetables: Artichokes. Beets. Broccoli. Cabbage. Carrots. Eggplant. Green beans. Chard. Kale. Spinach. Onions. Leeks. Peas. Squash. Tomatoes. Peppers. Radishes. Fruits: Apples. Apricots. Avocado. Berries. Bananas. Cherries. Dates. Figs. Grapes. Lemons. Melon. Oranges. Peaches. Plums. Pomegranate. Meats and other protein foods: Beans. Almonds. Sunflower seeds. Pine nuts. Peanuts. Cod. Salmon. Scallops. Shrimp. Tuna. Tilapia. Clams. Oysters. Eggs. Dairy: Low-fat milk. Cheese. Greek yogurt. Beverages: Water. Red wine. Herbal tea. Fats and oils: Extra virgin olive oil. Avocado oil. Grape seed oil. Sweets and desserts: [place] yogurt with honey. Baked apples. Poached pears. Trail mix. Seasoning and other foods: Basil. Cilantro. Coriander. Cumin. Mint. Parsley. Sage. Rosemary. Tarragon. Garlic. Oregano. Thyme. Pepper. Balsalmic vinegar. Tahini. Hummus. Tomato sauce. Olives. Mushrooms. Limit these Grains: Prepackaged pasta or rice dishes. Prepackaged cereal with added sugar. Vegetables: Deep fried potatoes (french fries). Fruits: Fruit canned in syrup. Meats and  other protein foods: Beef. Pork. Lamb. Poultry with skin. Hot dogs. Tomasa Blase. Dairy: Ice cream. Sour cream. Whole milk. Beverages: Juice. Sugar-sweetened soft drinks. Beer. Liquor and spirits. Fats and oils: Butter. Canola oil. Vegetable oil. Beef fat (tallow). Lard. Sweets and desserts: Cookies. Cakes. Pies. Candy. Seasoning and other foods: Mayonnaise. Premade sauces and marinades. The items listed may not be a complete list. Talk with your dietitian about what dietary choices are right for you. Summary The Mediterranean diet includes both  food and lifestyle choices. Eat a variety of fresh fruits and vegetables, beans, nuts, seeds, and whole grains. Limit the amount of red meat and sweets that you eat. Talk with your health care provider about whether it is safe for you to drink red wine in moderation. This means 1 glass a day for nonpregnant women and 2 glasses a day for men. A glass of wine equals 5 oz (150 mL). This information is not intended to replace advice given to you by your health care provider. Make sure you discuss any questions you have with your health care provider. Document Released: 01/05/2016 Document Revised: 02/07/2016 Document Reviewed: 01/05/2016 Elsevier Interactive Patient Education  2017 [employer].

## 2023-03-08 ENCOUNTER — Ambulatory Visit: Payer: Medicare Other | Admitting: Family Medicine

## 2023-03-12 ENCOUNTER — Telehealth: Payer: Self-pay | Admitting: Family Medicine

## 2023-03-12 NOTE — Telephone Encounter (Signed)
Document Home Health Certificate (Order ID [id number]), to be filled out by provider. Patient requested to send it back via Fax within 5-days. Document is located in providers tray at front office.Please advise

## 2023-03-13 DIAGNOSIS — F411 Generalized anxiety disorder: Secondary | ICD-10-CM | POA: Diagnosis not present

## 2023-03-13 DIAGNOSIS — Z7984 Long term (current) use of oral hypoglycemic drugs: Secondary | ICD-10-CM | POA: Diagnosis not present

## 2023-03-13 DIAGNOSIS — Z7901 Long term (current) use of anticoagulants: Secondary | ICD-10-CM | POA: Diagnosis not present

## 2023-03-13 DIAGNOSIS — E11621 Type 2 diabetes mellitus with foot ulcer: Secondary | ICD-10-CM | POA: Diagnosis not present

## 2023-03-13 DIAGNOSIS — E538 Deficiency of other specified B group vitamins: Secondary | ICD-10-CM | POA: Diagnosis not present

## 2023-03-13 DIAGNOSIS — L97421 Non-pressure chronic ulcer of left heel and midfoot limited to breakdown of skin: Secondary | ICD-10-CM | POA: Diagnosis not present

## 2023-03-13 NOTE — Telephone Encounter (Signed)
Form faxed to [phone] Form placed to be scan in patient chart

## 2023-03-19 ENCOUNTER — Telehealth: Payer: Self-pay

## 2023-03-19 NOTE — Telephone Encounter (Signed)
Transition Care Management Unsuccessful Follow-up Telephone Call  Date of discharge and from where:  02/15/2023 [facility]  Attempts:  1st Attempt  Reason for unsuccessful TCM follow-up call:  No answer/busy  Maite Burlison [facility]  [facility], [facility] Resource Care Guide Direct Dial: [phone]  Website: [email or web address]

## 2023-03-21 ENCOUNTER — Telehealth: Payer: Self-pay

## 2023-03-21 NOTE — Telephone Encounter (Signed)
Transition Care Management Unsuccessful Follow-up Telephone Call  Date of discharge and from where:  02/15/2023 [facility]  Attempts:  2nd Attempt  Reason for unsuccessful TCM follow-up call:  No answer/busy  Ronak Duquette [facility]  [facility], [facility] Resource Care Guide Direct Dial: [phone]  Website: [email or web address]

## 2023-03-22 ENCOUNTER — Other Ambulatory Visit: Payer: Self-pay | Admitting: Family Medicine

## 2023-03-26 ENCOUNTER — Other Ambulatory Visit: Payer: Self-pay | Admitting: Physician Assistant

## 2023-03-27 ENCOUNTER — Encounter: Payer: Self-pay | Admitting: Physician Assistant

## 2023-03-28 ENCOUNTER — Ambulatory Visit: Admission: RE | Admit: 2023-03-28 | Payer: Medicare Other | Source: Ambulatory Visit

## 2023-03-29 ENCOUNTER — Other Ambulatory Visit: Payer: Medicare Other

## 2023-03-29 ENCOUNTER — Other Ambulatory Visit: Payer: Self-pay | Admitting: Internal Medicine

## 2023-03-29 ENCOUNTER — Telehealth: Payer: Self-pay | Admitting: Physician Assistant

## 2023-03-29 DIAGNOSIS — I4891 Unspecified atrial fibrillation: Secondary | ICD-10-CM

## 2023-03-29 NOTE — Telephone Encounter (Signed)
Prescription refill request for Eliquis received. Indication: Afib  Last office visit: 07/12/22 Laural Benes)  Scr: 1.08 (02/15/23)  Age: 82 Weight: 88.9kg  Appropriate dose. Refill sent.

## 2023-03-29 NOTE — Telephone Encounter (Signed)
I called an advised to contact PCP to see what they recommended for muscle relaxer.They will reach back out to [place]. Thanked me for calling.

## 2023-03-29 NOTE — Telephone Encounter (Signed)
Pt's daughter called in stating the pt was supposed to have an MRI done yesterday, but it did not get done. He had been having lower back issues and pain. They tried everything they could to get him to lay flat enough, but there was not anything they could do. She is wondering if they could try a muscle relaxer before they try it again?

## 2023-04-08 DIAGNOSIS — M5136 Other intervertebral disc degeneration, lumbar region with discogenic back pain only: Secondary | ICD-10-CM | POA: Diagnosis not present

## 2023-04-08 DIAGNOSIS — G8911 Acute pain due to trauma: Secondary | ICD-10-CM | POA: Diagnosis not present

## 2023-04-08 DIAGNOSIS — M48061 Spinal stenosis, lumbar region without neurogenic claudication: Secondary | ICD-10-CM | POA: Diagnosis not present

## 2023-04-08 DIAGNOSIS — Z888 Allergy status to other drugs, medicaments and biological substances status: Secondary | ICD-10-CM | POA: Diagnosis not present

## 2023-04-08 DIAGNOSIS — M4186 Other forms of scoliosis, lumbar region: Secondary | ICD-10-CM | POA: Diagnosis not present

## 2023-04-08 DIAGNOSIS — S22080A Wedge compression fracture of T11-T12 vertebra, initial encounter for closed fracture: Secondary | ICD-10-CM | POA: Diagnosis not present

## 2023-04-08 DIAGNOSIS — M47816 Spondylosis without myelopathy or radiculopathy, lumbar region: Secondary | ICD-10-CM | POA: Diagnosis not present

## 2023-04-19 ENCOUNTER — Encounter: Payer: Self-pay | Admitting: Family Medicine

## 2023-04-19 ENCOUNTER — Ambulatory Visit (INDEPENDENT_AMBULATORY_CARE_PROVIDER_SITE_OTHER): Payer: Medicare Other | Admitting: Family Medicine

## 2023-04-19 VITALS — BP 108/68 | HR 68 | Temp 97.8°F | Ht 72.0 in | Wt 199.0 lb

## 2023-04-19 DIAGNOSIS — R21 Rash and other nonspecific skin eruption: Secondary | ICD-10-CM | POA: Diagnosis not present

## 2023-04-19 MED ORDER — TRIAMCINOLONE ACETONIDE 0.1 % EX OINT: 1.0000 | TOPICAL_OINTMENT | Freq: Two times a day (BID) | CUTANEOUS | 1 refills | Status: DC

## 2023-04-19 NOTE — Progress Notes (Signed)
   Subjective:    Patient ID: [name], male    DOB: [birth year], 82 y.o.   MRN: [id number]  HPI Rash- sxs started on R arm w/ itching.  Told daughter on Tuesday.  Now has sxs on both arms.  Some itching on lower part of back.  No itching of hands/feet.  No itching of groin/waistband.  No recent medication changes.  No recent travel.  No pets or animal exposure.  Itch worsens w/ hydrocortisone cream.  No changes to soaps or lotions.  Did spent time in the ER and daughter is wondering about the sheets or possible rxn  Review of Systems For ROS see HPI     Objective:   Physical Exam Vitals reviewed.  Constitutional:      General: He is not in acute distress.    Appearance: Normal appearance. He is not ill-appearing.  HENT:     Head: Normocephalic and atraumatic.  Skin:    General: Skin is warm and dry.     Findings: Erythema (large area of erythema on L forearm that is dry and thickened) and rash (R forearm w/ diffuse excoriations in various stages of scabbing making it impossible to characterize rash) present.  Neurological:     Mental Status: He is alert.           Assessment & Plan:  Rash- new.  No recent medication changes, no changes to personal care items.  No recent exposures to animals or young children.  He reports that the R arm initially looked like the L arm until he scratched it open.  Based on this, it appears to be more of a contact dermatitis- which would make the hospital sheets or other contact a possibility.  Start topical Triamcinolone ointment.  Reviewed supportive care and red flags that should prompt return.  Pt expressed understanding and is in agreement w/ plan.

## 2023-04-19 NOTE — Patient Instructions (Signed)
Follow up as needed or as scheduled APPLY the Triamcinolone ointment twice daily Cool compresses can help w/ the itch Hot water will make the itching worse Call with any questions or concerns Stay Safe!  Stay Healthy! Happy Holidays!!!!

## 2023-04-22 ENCOUNTER — Ambulatory Visit
Admission: RE | Admit: 2023-04-22 | Discharge: 2023-04-22 | Disposition: A | Discharge status: Home / Self Care | Payer: Medicare Other | Source: Ambulatory Visit | Attending: Physician Assistant | Admitting: Physician Assistant

## 2023-04-30 ENCOUNTER — Encounter: Payer: Self-pay | Admitting: Family Medicine

## 2023-04-30 MED ORDER — MUPIROCIN 2 % EX OINT: 1.0000 | TOPICAL_OINTMENT | Freq: Two times a day (BID) | CUTANEOUS | 1 refills | Status: DC

## 2023-04-30 NOTE — Addendum Note (Signed)
Addended by: Sheliah Hatch on: 04/30/2023 07:37 PM   Modules accepted: Orders

## 2023-05-13 ENCOUNTER — Ambulatory Visit: Payer: Medicare Other | Attending: Nurse Practitioner | Admitting: Internal Medicine

## 2023-05-13 VITALS — BP 114/60 | HR 59 | Ht 71.0 in | Wt 205.0 lb

## 2023-05-13 DIAGNOSIS — I480 Paroxysmal atrial fibrillation: Secondary | ICD-10-CM | POA: Diagnosis not present

## 2023-05-13 DIAGNOSIS — Z01812 Encounter for preprocedural laboratory examination: Secondary | ICD-10-CM | POA: Diagnosis not present

## 2023-05-13 DIAGNOSIS — I5022 Chronic systolic (congestive) heart failure: Secondary | ICD-10-CM | POA: Insufficient documentation

## 2023-05-13 DIAGNOSIS — Z89511 Acquired absence of right leg below knee: Secondary | ICD-10-CM | POA: Diagnosis not present

## 2023-05-13 DIAGNOSIS — I7121 Aneurysm of the ascending aorta, without rupture: Secondary | ICD-10-CM | POA: Diagnosis not present

## 2023-05-13 NOTE — Progress Notes (Signed)
OFFICE NOTE  Chief Complaint:  Follow-up  Primary Care Physician: Ardith Dark, MD  HPI:  [name] is a 82 y.o. male he was previously seen by Dr. Antoine Poche last in 2013. He was followed for PVCs at that time and underwent stress testing. There is no evidence for ischemia and no further workup was recommended. Recently was hospitalized for sepsis and was noted to have intermittent SVTs as well as PVCs on telemetry. Although he was not seen by cardiology as an inpatient, we were consult it for outpatient monitor. He was placed on a monitor which I reviewed and is still in progress. The first 24 days of the monitor show frequent PVCs as well as intermittent SVT. During this past hospitalization he had an echocardiogram which shows a newly reduced LVEF of 45-50%, with basal inferior akinesis and inferolateral hypokinesis and grade 2 diastolic dysfunction. This compares to a normal LVF and 2013 by echo. He did have a remote left heart catheterization in 2008 which showed minimal coronary artery disease. He denies any chest pain or worsening shortness of breath. He is having trouble ambulating due to problems with his right ankle. He also reports being under significant stress dealing with his wife who has Parkinson's. He is in the process of moving to [facility] [place].  01/30/2017  [name] returns today for follow-up. He has had a difficult year. His wife was diagnosed with Parkinson's and ultimately died of congestive heart failure. He developed a diabetic wound and then had the transtibial amputation of the right leg. He was supposed going to [facility] but ultimately went to [facility] for rehabilitation and is currently back at home. He does have a leg prostheses. He continues to have SVT in fact has a short RP tachycardia which was caught today in the office. He says is asymptomatic with this. EF is shown to be 45-50% in the past. He did undergo nuclear stress test which was negative for  ischemia prior to his amputation. He denies any chest pain.  09/03/2017  [name] returns today for follow-up.  He has no new complaints today.  He has been struggling with some back pain.  He feels like his right leg prosthesis may be a little short.  He denies any recurrent SVT.  He was placed on the carvedilol for cardiomyopathy with EF 45-50% in 2017 and a blood pressures been well controlled on that.  He denies any chest pain.  He reports occasional shortness of breath sometimes with exertion but also at rest.  10/02/2017  [name] was seen today in follow-up.  He continues to get some care from the [place].  He did report some problems with his right leg prosthesis.  He has not been able to exercise since then.  He gets mild shortness of breath with exertion, consistent with NYHA class II symptoms.  Recent echo was performed to see if he had any improvement in LV function, unfortunately however he had a decline in LVEF to 40 to 45%.  There is additionally some mild to moderate RV dysfunction.  He is only on carvedilol.  I think he would benefit from the addition of Entresto.  Labs last year indicated normal renal function.  11/12/2017  [name] returns today for follow-up.  His main concerns are frequent urination both throughout the day and at night.  He was started on tamsulosin by his PCP which he takes 0.4 mg in the morning.  He does not  note any significant improvement.  I had started him on Entresto however due to frequent urination he discontinued the medication.  He was then switched to lisinopril.  He seems to be tolerating this.  LVEF had declined recently to 40 to 45%.  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  05/12/2018  [name] is seen today in follow-up.  He does report some improvement in his urination.  I increase his dose to 0.8 mg tamsulosin however he said he had excessive urination with this and seems to do better  on the 0.4 mg.  His PCP had switched him to Rapaflo, but he said that was also worse.  He denies any worsening shortness of breath or chest pain.  He unfortunately was intolerant of Entresto due to frequent urination.  His EF had lower down to 40 to 45%.  Hopefully this is all related to his infection and now he is status post amputation and recovering.  He will need a repeat assessment of his EF next summer.  He also is reported some cough.  This might be related to lisinopril.  07/03/2018  [name] is seen today in follow-up of left heart cath.  This was performed for declining LVEF, episodes of chest pain at rest, and history of coronary disease.  He underwent left heart catheterization which demonstrated moderate coronary atherosclerosis with 70% stenosis of a small obtuse marginal, 40% stenosis in the first diagonal, 50% mid LAD stenosis, 40% mid RCA and 60% proximal PDA stenosis.  No significant obstructive coronary disease was identified and LVEDP was normal.  Aggressive secondary risk factor modification was recommended.  He returns today and says he is feeling great.  He says he is not sure what all the work-up was for.  I am concerned that because of his frequency of PVCs that at this point he may also have had a PVC mediated cardiomyopathy.  He was ordered to wear a monitor however he declined.  He does not see the value in that.  Therefore we may be left only with medical therapy.  01/21/2019  [name] returns today for follow-up.  Overall he seems to be doing well.  He is actually in a great mood.  He says he is rekindled a relationship with former girlfriend now 2 years after his wife died.  In addition his energy level is good.  He actually purchased exercise equipment and is now been working out in his house.  He denies any worsening shortness of breath.  He did have a repeat echo in June which showed unfortunately no significant change in LV function at 40 to 45%, however is not worsened.  He  feels like he has had excessive urination on Entresto and I am hesitant to increase the dose further.  We did recommend decreasing his tamsulosin back to 0.4 mg daily.  He also asked about Viagra-I would feel that this is safe for him to use however he should monitor for hypotension.  One other option to consider would be daily Cialis in him if he has some issue with BPH as well.  I will defer this to his PCP as he has an upcoming visit in a few days.  07/27/2019  [name] is seen today in follow-up.  He continues to report feeling fairly well.  He like to get back to playing some golf.  His echo shows stable LVEF of 40 to 45%.  He remains on Entresto but notes that because of excessive urination I could not increase his Entresto any further.  Blood pressures well controlled.  Denies any chest pain or worsening shortness of breath.  10/11/2020  [name] returns today for follow-up.  Overall he says he is feeling pretty well.  He seems to be getting around after having a right BKA.  He is trying to play golf again.  He denies any worsening shortness of breath or chest pain.  He is tolerating the Entresto.  His last echo showed moderate reduction in LV function however that was in 2020.  Blood pressure is well controlled.  EKG shows a sinus bradycardia.  01/11/2022  [name] returns today for follow-up.  Overall he seems to be doing well.  He has developed some poorly healing wounds to the left lower extremity and is status post right BKA.  He is getting wound care and has an upcoming lower extremity arterial Doppler on the left.  He does seem to think it is getting better.  He is LVEF has improved by echo up to 50 to 55% in June with a stable dilated aortic root measuring 41 mm.  He has no more worse than NYHA class II symptoms.  He remains on Entresto and carvedilol.  Blood pressure is well controlled at 118/62.  05/17/2023  [name] returns today for follow-up.  In general he feels like he is doing  well.  His blood pressure was 114/60 today.  He did have an echo last June 2023 which showed EF improved to 45 to 50% however the ascending aorta was moderately dilated at 45 mm.  A repeat echo was recommended but has not yet been performed.  PMHx:  Past Medical History:  Diagnosis Date   CAD (coronary artery disease)    a. Remote cath 2008 for chest pain showed 30% LAD with luminal irregularity and fairly heavy calcification in the mid LAD without critical stenosis, 30% OM1, 30% acute marginal.   Cancer (HCC)    skin cancer - basal   Cataract    both   Chronic combined systolic and diastolic CHF (congestive heart failure) (HCC)    Complication of anesthesia    Diabetes mellitus    Type II   ED (erectile dysfunction)    History of kidney stones    x1   Hypertension    NICM (nonischemic cardiomyopathy) (HCC)    a. EF 40-45% by echo in 08/2017 - prior low risk stress test in 2017.   Osteoarthritis    Paroxysmal SVT (supraventricular tachycardia) (HCC)    PONV (postoperative nausea and vomiting) 1960   with Ether   PVC's (premature ventricular contractions)    PVD (peripheral vascular disease) (HCC)    s/p R BKA   Rheumatic fever    Rhinitis    S/P BKA (below knee amputation), right [facility])     Past Surgical History:  Procedure Laterality Date   AMPUTATION Right 02/29/2016   Procedure: Right Leg AMPUTATION BELOW KNEE with Wound Vac placement;  Surgeon: Nadara Mustard, MD;  Location: [facility] OR;  Service: Orthopedics;  Laterality: Right;   CARDIOVERSION N/A 06/08/2021   Procedure: CARDIOVERSION;  Surgeon: Jake Bathe, MD;  Location: [facility] ENDOSCOPY;  Service: Cardiovascular;  Laterality: N/A;   CHOLECYSTECTOMY  1983   colonoscopy  2006, 2010   EYE SURGERY Bilateral    cataract   INGUINAL HERNIA REPAIR Right 1960   LEFT HEART CATH AND CORONARY ANGIOGRAPHY N/A 06/03/2018  Procedure: LEFT HEART CATH AND CORONARY ANGIOGRAPHY;  Surgeon: Lyn Records, MD;  Location: [facility] INVASIVE CV LAB;   Service: Cardiovascular;  Laterality: N/A;   STUMP REVISION Right 10/30/2017   Procedure: RIGHT BELOW KNEE AMPUTATION REVISION;  Surgeon: Nadara Mustard, MD;  Location: [facility] OR;  Service: Orthopedics;  Laterality: Right;    FAMHx:  Family History  Problem Relation Age of Onset   Stroke Father 37       Died age 58   Coronary artery disease Brother 32   Colon cancer Neg Hx    Colon polyps Neg Hx    Diabetes Neg Hx    Esophageal cancer Neg Hx    Kidney disease Neg Hx    Gallbladder disease Neg Hx     SOCHx:   reports that he quit smoking about 41 years ago. His smoking use included cigarettes. He started smoking about 61 years ago. He has never used smokeless tobacco. He reports that he does not currently use alcohol. He reports that he does not use drugs.  ALLERGIES:  Allergies  Allergen Reactions   Lisinopril Cough    ROS: Pertinent items noted in HPI and remainder of comprehensive ROS otherwise negative.  HOME MEDS: Current Outpatient Medications  Medication Sig Dispense Refill   Accu-Chek Softclix Lancets lancets Use to check blood sugar three times a day. 300 each 4   acetaminophen (TYLENOL) 500 MG tablet Take 500-1,000 mg by mouth every 6 (six) hours as needed (pain).     apixaban (ELIQUIS) 5 MG TABS tablet TAKE 1 TABLET BY MOUTH TWICE A DAY 60 tablet 5   carvedilol (COREG) 25 MG tablet Take 1 tablet (25 mg total) by mouth 2 (two) times daily. 180 tablet 2   doxycycline (VIBRA-TABS) 100 MG tablet Take 1 tablet (100 mg total) by mouth 2 (two) times daily. 30 tablet 0   escitalopram (LEXAPRO) 10 MG tablet Take 1 tablet (10 mg total) by mouth daily. 90 tablet 3   furosemide (LASIX) 20 MG tablet Take 2 tablets (40 mg total) by mouth daily. 180 tablet 3   glucose blood (ACCU-CHEK AVIVA PLUS) test strip Use 3 times daily. Morning, noon, and nightly to check glucose levels Dx: E11.9 (type 2 diabetes mellitus) 100 strip 11   halobetasol (ULTRAVATE) 0.05 % cream APPLY TO AFFECTED AREA  TWICE A DAY 60 g 2   memantine (NAMENDA) 5 MG tablet TAKE 1 TABLET (5 MG AT NIGHT) FOR 2 WEEKS, THEN INCREASE TO 1 TABLET (5 MG) TWICE A DAY 180 tablet 2   metFORMIN (GLUCOPHAGE) 1000 MG tablet TAKE 1 TABLET BY MOUTH EVERY DAY WITH BREAKFAST 90 tablet 1   mupirocin ointment (BACTROBAN) 2 % Apply 1 Application topically 2 (two) times daily. 30 g 1   nystatin cream (MYCOSTATIN) Apply 1 Application topically 2 (two) times daily as needed (skin irritation). 30 g 2   pantoprazole (PROTONIX) 40 MG tablet TAKE 1 TABLET BY MOUTH EVERY DAY 90 tablet 1   potassium chloride SA (KLOR-CON M20) 20 MEQ tablet TAKE 2 TABLETS BY MOUTH EVERY MORNING 180 tablet 3   sacubitril-valsartan (ENTRESTO) 49-51 MG Take 1 tablet by mouth 2 (two) times daily. 180 tablet 1   solifenacin (VESICARE) 10 MG tablet TAKE 1 TABLET BY MOUTH EVERY DAY 90 tablet 3   tamsulosin (FLOMAX) 0.4 MG CAPS capsule TAKE 1 CAPSULE BY MOUTH EVERY DAY 90 capsule 3   Vitamin D, Ergocalciferol, (DRISDOL) 1.25 MG (50000 UNIT) CAPS capsule Take 1 capsule (50,000  Units total) by mouth every 7 (seven) days. 12 capsule 0   baclofen (LIORESAL) 10 MG tablet TAKE 0.5 TABLETS BY MOUTH 2 TIMES DAILY. (Patient not taking: Reported on 05/13/2023) 90 tablet 1   gabapentin (NEURONTIN) 100 MG capsule Take 1 capsule (100 mg total) by mouth 3 (three) times daily. TAKE 1 CAPSULE BY MOUTH THREE TIMES A DAY Strength: 100 mg (Patient not taking: Reported on 05/13/2023) 270 capsule 3   LORazepam (ATIVAN) 0.5 MG tablet Take 1 tablet (0.5 mg total) by mouth 2 (two) times daily as needed for anxiety. (Patient not taking: Reported on 05/13/2023) 15 tablet 0   triamcinolone ointment (KENALOG) 0.1 % Apply 1 Application topically 2 (two) times daily. (Patient not taking: Reported on 05/13/2023) 90 g 1   No current facility-administered medications for this visit.    LABS/IMAGING: No results found for this or any previous visit (from the past 48 hours). No results  found.  WEIGHTS: Wt Readings from Last 3 Encounters:  05/13/23 205 lb (93 kg)  04/19/23 199 lb (90.3 kg)  03/04/23 196 lb (88.9 kg)    VITALS: BP 114/60 (BP Location: Left Arm, Patient Position: Sitting)   Pulse (!) 59   Ht 5\' 11"  (1.803 m)   Wt 205 lb (93 kg)   SpO2 98%   BMI 28.59 kg/m   EXAM: General appearance: alert, no distress and mildly obese Lungs: clear to auscultation bilaterally Heart: regular rate and rhythm, S1, S2 normal, no murmur, click, rub or gallop Abdomen: soft, non-tender; bowel sounds normal; no masses,  no organomegaly Extremities: Trace left lower extremity edema, status post right BKA Psych: Pleasant  EKG: EKG Interpretation Date/Time:  Monday May 13 2023 15:09:33 EST Ventricular Rate:  57 PR Interval:  166 QRS Duration:  76 QT Interval:  424 QTC Calculation: 412 R Axis:   -4  Text Interpretation: Sinus bradycardia When compared with ECG of 15-Feb-2023 13:12, PREVIOUS ECG IS PRESENT Compared to previous tracing the PAC's 'and PVC's are not present Confirmed by Zoila Shutter [id number]) on 05/13/2023 3:34:46 PM    ASSESSMENT: Non-ischemic cardiomyopathy with EF 40-45% (08/2017) -> improved to 50 to 55% (10/2021), NYHA Class II symptoms Recurrent short RP tachycardia  Hypertension Type 2 diabetes Right BKA Dilated aortic root to 41 mm and ascending aorta to 45 mm (2023)  PLAN: 1.   Mr. Safranski is doing fairly well.  No changes will be made to his medicines today.  He will need reevaluation of his dilated aorta which was noted on echo.  Will order CT angiogram of the chest and aorta to follow-up on this.  I will contact him with those results but plan otherwise follow-up annually or sooner as necessary.  Chrystie Nose, MD, [facility], FACP  [place]  [facility]  Medical Director of the Advanced Lipid Disorders &  Cardiovascular Risk Reduction Clinic Diplomate of the American Board of Clinical Lipidology Attending Cardiologist  Direct  Dial: [phone]  Fax: [phone]  Website:  [email or web address] 05/13/2023, 3:34 PM

## 2023-05-13 NOTE — Patient Instructions (Signed)
Medication Instructions:  NO CHANGES  *If you need a refill on your cardiac medications before your next appointment, please call your pharmacy*   Lab Work: Non-Fasting BMET to be done before CT test  If you have labs (blood work) drawn today and your tests are completely normal, you will receive your results only by: MyChart Message (if you have MyChart) OR A paper copy in the mail If you have any lab test that is abnormal or we need to change your treatment, we will call you to review the results.   Testing/Procedures: CT angiogram chest/aorta at [facility] [address] [place], [place] [address]    Follow-Up: At [facility], you and your health needs are our priority.  As part of our continuing mission to provide you with exceptional heart care, we have created designated Provider Care Teams.  These Care Teams include your primary Cardiologist (physician) and Advanced Practice Providers (APPs -  Physician Assistants and Nurse Practitioners) who all work together to provide you with the care you need, when you need it.  We recommend signing up for the patient portal called "MyChart".  Sign up information is provided on this After Visit Summary.  MyChart is used to connect with patients for Virtual Visits (Telemedicine).  Patients are able to view lab/test results, encounter notes, upcoming appointments, etc.  Non-urgent messages can be sent to your provider as well.   To learn more about what you can do with MyChart, go to [email or web address].    Your next appointment:   12 months with Dr. Rennis Golden

## 2023-05-16 ENCOUNTER — Encounter: Payer: Self-pay | Admitting: Physician Assistant

## 2023-05-20 ENCOUNTER — Telehealth: Payer: Self-pay

## 2023-05-20 ENCOUNTER — Other Ambulatory Visit: Payer: Self-pay

## 2023-05-20 DIAGNOSIS — R413 Other amnesia: Secondary | ICD-10-CM

## 2023-05-20 NOTE — Telephone Encounter (Signed)
I ordered at [facility], patient refused to do this at any facility.

## 2023-05-23 ENCOUNTER — Other Ambulatory Visit: Payer: Self-pay | Admitting: Family Medicine

## 2023-06-04 ENCOUNTER — Encounter: Payer: Self-pay | Admitting: Physician Assistant

## 2023-06-04 ENCOUNTER — Ambulatory Visit (INDEPENDENT_AMBULATORY_CARE_PROVIDER_SITE_OTHER): Payer: Medicare Other | Admitting: Physician Assistant

## 2023-06-04 VITALS — BP 128/65 | HR 63 | Resp 20 | Ht 71.0 in

## 2023-06-04 DIAGNOSIS — R413 Other amnesia: Secondary | ICD-10-CM | POA: Diagnosis not present

## 2023-06-04 MED ORDER — MEMANTINE HCL 5 MG PO TABS: ORAL_TABLET | ORAL | 3 refills | Status: DC

## 2023-06-04 NOTE — Progress Notes (Signed)
 Assessment/Plan:   Memory impairment of unclear etiology  [name] is a very pleasant 83 y.o. LH male with a history of hypertension, hyperlipidemia, CSCHF, NICM, Afib on Eliquis   DM2 with neuropathy, OA, GERD, B12 deficiency, anxiety seen today in follow up for memory loss. Patient is currently on memantine  5 mg bid, tolerating well (bradycardia).  To date, he had tried twice to have his MRI performed, but due to chronic back issues, this was unable to be performed.  Apparently, in one of the MRI machines, he could not sit properly.  We entertained the idea of open MRI, he wants to delay the procedure for at least 6 months.  His memory is stable, MMSE today is 21/30.  He has difficulty with ADLs due to orthopedic limitations.  He continues to drive short distances without any issues.  Mood is controlled with Lexapro .  Follow up in 6 months. Recommend proceeding with MRI of the brain for further evaluation of structural abnormalities and vascular load when possible to him.  Will entertain open MRI of the brain if that is an available option. Continue Memantine  5 mg twice daily. Side effects were discussed  Continue B12 supplements Recommend good control of her cardiovascular risk factors.  Continue Eliquis .  Follow with cardiology Continue to control mood as per PCP If patient is able to provide better testing effort, will schedule neuropsych evaluation for clarity of diagnosis.     Subjective:    This patient is accompanied in the office by his daughter who supplements the history.  Previous records as well as any outside records available were reviewed prior to todays visit. Patient was last seen on 03/04/2023 at which time MoCA was 14/30 (however he demonstrated poor effort)   Any changes in memory since last visit? About the same.  He does report some difficulty remembering new information, recent conversations, names, places that he had been. repeats oneself?  Endorsed  especially with appointments Disoriented when walking into a room?  Patient denies    Leaving objects?  May misplace things patient especially the phone although he finds it quickly.    Wandering behavior?  denies   Any personality changes since last visit?  His daughter reports that memantine  help with the mood   He still gets frustrated easier but has less temper changes.  Any worsening depression?:  Denies.   Hallucinations or paranoia?  Denies.   Seizures? denies    Any sleep changes?  He sleeps well.  Denies vivid dreams, REM behavior or sleepwalking   Sleep apnea?   Denies.   Any hygiene concerns? Denies.  Independent of bathing and dressing?  Endorsed  Does the patient needs help with medications?  Daughter is in charge   Who is in charge of the finances?  Daughter is in charge     Any changes in appetite?  Appetite is better, drinks 1 glass of water and then he does drink a lot of coffee and Pepsi -daughter says.    Patient have trouble swallowing? Denies.   Does the patient cook? No Any headaches?   Denies.   Chronic back pain has chronic low back pain (OA).  Ambulates with difficulty?  2 months ago he missed the chair when he was to sit down and fell, had a small closed fracture which now has healed. He has RL below knee amputation due to gangrenous diabetic ulcer in 2017.  He needs a L cane  to ambulate for stability.   Unilateral  weakness, numbness or tingling? denies   Any tremors?  Only when anxious  Any anosmia?  Denies   Any incontinence of urine?   Denies  Any bowel dysfunction?   Denies      Patient lives alone, family is looking into independent living versus ALF . [facility] looked at [facility] and will be making a decision soon. Does the patient drive? Short distances      PREVIOUS MEDICATIONS:   CURRENT MEDICATIONS:  Outpatient Encounter Medications as of 06/04/2023  Medication Sig   Accu-Chek Softclix Lancets lancets Use to check blood sugar three times a day.    acetaminophen  (TYLENOL ) 500 MG tablet Take 500-1,000 mg by mouth every 6 (six) hours as needed (pain).   apixaban  (ELIQUIS ) 5 MG TABS tablet TAKE 1 TABLET BY MOUTH TWICE A DAY   baclofen  (LIORESAL ) 10 MG tablet TAKE 0.5 TABLETS BY MOUTH 2 TIMES DAILY.   carvedilol  (COREG ) 25 MG tablet Take 1 tablet (25 mg total) by mouth 2 (two) times daily.   doxycycline  (VIBRA -TABS) 100 MG tablet Take 1 tablet (100 mg total) by mouth 2 (two) times daily.   escitalopram  (LEXAPRO ) 10 MG tablet Take 1 tablet (10 mg total) by mouth daily.   furosemide  (LASIX ) 20 MG tablet Take 2 tablets (40 mg total) by mouth daily.   gabapentin  (NEURONTIN ) 100 MG capsule Take 1 capsule (100 mg total) by mouth 3 (three) times daily. TAKE 1 CAPSULE BY MOUTH THREE TIMES A DAY Strength: 100 mg   glucose blood (ACCU-CHEK AVIVA PLUS) test strip Use 3 times daily. Morning, noon, and nightly to check glucose levels Dx: E11.9 (type 2 diabetes mellitus)   halobetasol  (ULTRAVATE ) 0.05 % cream APPLY TO AFFECTED AREA TWICE A DAY   LORazepam  (ATIVAN ) 0.5 MG tablet Take 1 tablet (0.5 mg total) by mouth 2 (two) times daily as needed for anxiety.   metFORMIN  (GLUCOPHAGE ) 1000 MG tablet TAKE 1 TABLET BY MOUTH EVERY DAY WITH BREAKFAST   mupirocin  ointment (BACTROBAN ) 2 % Apply 1 Application topically 2 (two) times daily.   nystatin  cream (MYCOSTATIN ) Apply 1 Application topically 2 (two) times daily as needed (skin irritation).   pantoprazole  (PROTONIX ) 40 MG tablet TAKE 1 TABLET BY MOUTH EVERY DAY   potassium chloride  SA (KLOR-CON  M20) 20 MEQ tablet TAKE 2 TABLETS BY MOUTH EVERY MORNING   sacubitril -valsartan  (ENTRESTO ) 49-51 MG Take 1 tablet by mouth 2 (two) times daily.   solifenacin  (VESICARE ) 10 MG tablet TAKE 1 TABLET BY MOUTH EVERY DAY   tamsulosin  (FLOMAX ) 0.4 MG CAPS capsule TAKE 1 CAPSULE BY MOUTH EVERY DAY   triamcinolone  ointment (KENALOG ) 0.1 % Apply 1 Application topically 2 (two) times daily.   Vitamin D , Ergocalciferol , (DRISDOL )  1.25 MG (50000 UNIT) CAPS capsule Take 1 capsule (50,000 Units total) by mouth every 7 (seven) days.   [DISCONTINUED] memantine  (NAMENDA ) 5 MG tablet TAKE 1 TABLET (5 MG AT NIGHT) FOR 2 WEEKS, THEN INCREASE TO 1 TABLET (5 MG) TWICE A DAY   memantine  (NAMENDA ) 5 MG tablet Take 1 table twice a day   No facility-administered encounter medications on file as of 06/04/2023.       06/04/2023    5:00 PM  MMSE - Mini Mental State Exam  Orientation to time 4  Orientation to Place 5  Registration 3  Attention/ Calculation 0  Recall 0  Language- name 2 objects 2  Language- repeat 1  Language- follow 3 step command 3  Language- read & follow direction 1  Write  a sentence 1  Copy design 1  Total score 21      03/04/2023    1:00 PM  Montreal Cognitive Assessment   Visuospatial/ Executive (0/5) 2  Naming (0/3) 3  Attention: Read list of digits (0/2) 2  Attention: Read list of letters (0/1) 0  Attention: Serial 7 subtraction starting at 100 (0/3) 1  Language: Repeat phrase (0/2) 0  Language : Fluency (0/1) 0  Abstraction (0/2) 1  Delayed Recall (0/5) 1  Orientation (0/6) 4  Total 14  Adjusted Score (based on education) 14    Objective:     PHYSICAL EXAMINATION:    VITALS:   Vitals:   06/04/23 1510  BP: 128/65  Pulse: 63  Resp: 20  SpO2: 98%  Height: 5' 11 (1.803 m)    GEN:  The patient appears stated age and is in NAD. HEENT:  Normocephalic, atraumatic.   Neurological examination:  General: NAD, well-groomed, appears stated age. Orientation: The patient is alert. Oriented to person, place and date Cranial nerves: There is good facial symmetry.The speech is fluent and clear. No aphasia or dysarthria. Fund of knowledge is appropriate. Recent and remote memory are impaired. Attention and concentration are reduced.  Able to name objects and repeat phrases.  Hearing is intact to conversational tone.   Sensation: Sensation is intact to light touch throughout on the left, he has  right BKA Motor: Strength is at least antigravity x4. DTR's 2/4 in UE/LLE     Movement examination: Tone: There is normal tone in the UE/LE Abnormal movements:  no tremor.  No myoclonus.  No asterixis.   Coordination:  There is no decremation with RAM's. Normal finger to nose  Gait and Station: The patient has some difficulty arising out of a deep-seated chair without the use of the hands.  Stride length is normal he has a prosthesis RLE.  Gait is cautious and slightly wide based   Thank you for allowing us  the opportunity to participate in the care of this nice patient. Please do not hesitate to contact us  for any questions or concerns.   Total time spent on today's visit was 30 minutes dedicated to this patient today, preparing to see patient, examining the patient, ordering tests and/or medications and counseling the patient, documenting clinical information in the EHR or other health record, independently interpreting results and communicating results to the patient/family, discussing treatment and goals, answering patient's questions and coordinating care.  Cc:  Kennyth Worth HERO, MD  Camie Sevin 06/04/2023 5:12 PM

## 2023-06-04 NOTE — Patient Instructions (Signed)
 It was a pleasure to see you today at our office.   Recommendations:  Continue memantine  5 mg twice a day   Follow up in 6  months Recommend visiting the website :  Dementia Success Path to better understand some behaviors related to memory loss.    For psychiatric meds, mood meds: Please have your primary care physician manage these medications.  If you have any severe symptoms of a stroke, or other severe issues such as confusion,severe chills or fever, etc call 911 or go to the ER as you may need to be evaluated further   For guidance regarding [facility] and if placement were needed at the facility, contact Social Worker tel: [phone]  For assessment of decision of mental capacity and competency:  Call Dr. Rosaline Nine, geriatric psychiatrist at [phone]  Counseling regarding caregiver distress, including caregiver depression, anxiety and issues regarding community resources, adult day care programs, adult living facilities, or memory care questions:  please contact your  Primary Doctor's Social Worker   Whom to call: Memory  decline, memory medications: Call our office [phone]    [email or web address]   RECOMMENDATIONS FOR ALL PATIENTS WITH MEMORY PROBLEMS: 1. Continue to exercise (Recommend 30 minutes of walking everyday, or 3 hours every week) 2. Increase social interactions - continue going to [place] and enjoy social gatherings with friends and family 3. Eat healthy, avoid fried foods and eat more fruits and vegetables 4. Maintain adequate blood pressure, blood sugar, and blood cholesterol level. Reducing the risk of stroke and cardiovascular disease also helps promoting better memory. 5. Avoid stressful situations. Live a simple life and avoid aggravations. Organize your time and prepare for the next day in anticipation. 6. Sleep well, avoid any interruptions of sleep and avoid any  distractions in the bedroom that may interfere with adequate sleep quality 7. Avoid sugar, avoid sweets as there is a strong link between excessive sugar intake, diabetes, and cognitive impairment We discussed the Mediterranean diet, which has been shown to help patients reduce the risk of progressive memory disorders and reduces cardiovascular risk. This includes eating fish, eat fruits and green leafy vegetables, nuts like almonds and hazelnuts, walnuts, and also use olive oil. Avoid fast foods and fried foods as much as possible. Avoid sweets and sugar as sugar use has been linked to worsening of memory function.  There is always a concern of gradual progression of memory problems. If this is the case, then we may need to adjust level of care according to patient needs. Support, both to the patient and caregiver, should then be put into place.      You have been referred for a neuropsychological evaluation (i.e., evaluation of memory and thinking abilities). Please bring someone with you to this appointment if possible, as it is helpful for the doctor to hear from both you and another adult who knows you well. Please bring eyeglasses and hearing aids if you wear them.    The evaluation will take approximately 3 hours and has two parts:   The first part is a clinical interview with the neuropsychologist (Dr. Richie or Dr. Jackquline). During the interview, the neuropsychologist will speak with you and the individual you brought to the appointment.    The second part of the evaluation is testing with the doctor's technician Neal or Luke). During the testing, the technician will ask you to remember different types of material, solve problems, and answer some questionnaires. Your family member will not be present  for this portion of the evaluation.   Please note: We must reserve several hours of the neuropsychologist's time and the psychometrician's time for your evaluation appointment. As such, there is  a No-Show fee of $100. If you are unable to attend any of your appointments, please contact our office as soon as possible to reschedule.      DRIVING: Regarding driving, in patients with progressive memory problems, driving will be impaired. We advise to have someone else do the driving if trouble finding directions or if minor accidents are reported. Independent driving assessment is available to determine safety of driving.   If you are interested in the driving assessment, you can contact the following:  [employer] in August [phone]  [facility] [phone]  [facility] [phone]  [facility] [phone] or [phone]   FALL PRECAUTIONS: Be cautious when walking. Scan the area for obstacles that may increase the risk of trips and falls. When getting up in the mornings, sit up at the edge of the bed for a few minutes before getting out of bed. Consider elevating the bed at the head end to avoid drop of blood pressure when getting up. Walk always in a well-lit room (use night lights in the walls). Avoid area rugs or power cords from appliances in the middle of the walkways. Use a walker or a cane if necessary and consider physical therapy for balance exercise. Get your eyesight checked regularly.  FINANCIAL OVERSIGHT: Supervision, especially oversight when making financial decisions or transactions is also recommended.  HOME SAFETY: Consider the safety of the kitchen when operating appliances like stoves, microwave oven, and blender. Consider having supervision and share cooking responsibilities until no longer able to participate in those. Accidents with firearms and other hazards in the house should be identified and addressed as well.   ABILITY TO BE LEFT ALONE: If patient is unable to contact 911 operator, consider using LifeLine, or when the need is there, arrange for someone to stay with patients. Smoking is a fire  hazard, consider supervision or cessation. Risk of wandering should be assessed by caregiver and if detected at any point, supervision and safe proof recommendations should be instituted.  MEDICATION SUPERVISION: Inability to self-administer medication needs to be constantly addressed. Implement a mechanism to ensure safe administration of the medications.      Mediterranean Diet A Mediterranean diet refers to food and lifestyle choices that are based on the traditions of countries located on the [place]. This way of eating has been shown to help prevent certain conditions and improve outcomes for people who have chronic diseases, like kidney disease and heart disease. What are tips for following this plan? Lifestyle  Cook and eat meals together with your family, when possible. Drink enough fluid to keep your urine clear or pale yellow. Be physically active every day. This includes: Aerobic exercise like running or swimming. Leisure activities like gardening, walking, or housework. Get 7-8 hours of sleep each night. If recommended by your health care provider, drink red wine in moderation. This means 1 glass a day for nonpregnant women and 2 glasses a day for men. A glass of wine equals 5 oz (150 mL). Reading food labels  Check the serving size of packaged foods. For foods such as rice and pasta, the serving size refers to the amount of cooked product, not dry. Check the total fat in packaged foods. Avoid foods that have saturated fat or trans fats. Check the ingredients list  for added sugars, such as corn syrup. Shopping  At the grocery store, buy most of your food from the areas near the walls of the store. This includes: Fresh fruits and vegetables (produce). Grains, beans, nuts, and seeds. Some of these may be available in unpackaged forms or large amounts (in bulk). Fresh seafood. Poultry and eggs. Low-fat dairy products. Buy whole ingredients instead of prepackaged  foods. Buy fresh fruits and vegetables in-season from local farmers markets. Buy frozen fruits and vegetables in resealable bags. If you do not have access to quality fresh seafood, buy precooked frozen shrimp or canned fish, such as tuna, salmon, or sardines. Buy small amounts of raw or cooked vegetables, salads, or olives from the deli or salad bar at your store. Stock your pantry so you always have certain foods on hand, such as olive oil, canned tuna, canned tomatoes, rice, pasta, and beans. Cooking  Cook foods with extra-virgin olive oil instead of using butter or other vegetable oils. Have meat as a side dish, and have vegetables or grains as your main dish. This means having meat in small portions or adding small amounts of meat to foods like pasta or stew. Use beans or vegetables instead of meat in common dishes like chili or lasagna. Experiment with different cooking methods. Try roasting or broiling vegetables instead of steaming or sauteing them. Add frozen vegetables to soups, stews, pasta, or rice. Add nuts or seeds for added healthy fat at each meal. You can add these to yogurt, salads, or vegetable dishes. Marinate fish or vegetables using olive oil, lemon juice, garlic, and fresh herbs. Meal planning  Plan to eat 1 vegetarian meal one day each week. Try to work up to 2 vegetarian meals, if possible. Eat seafood 2 or more times a week. Have healthy snacks readily available, such as: Vegetable sticks with hummus. Greek yogurt. Fruit and nut trail mix. Eat balanced meals throughout the week. This includes: Fruit: 2-3 servings a day Vegetables: 4-5 servings a day Low-fat dairy: 2 servings a day Fish, poultry, or lean meat: 1 serving a day Beans and legumes: 2 or more servings a week Nuts and seeds: 1-2 servings a day Whole grains: 6-8 servings a day Extra-virgin olive oil: 3-4 servings a day Limit red meat and sweets to only a few servings a month What are my food  choices? Mediterranean diet Recommended Grains: Whole-grain pasta. Brown rice. Bulgar wheat. Polenta. Couscous. Whole-wheat bread. [place] Made[place]. Vegetables: Artichokes. Beets. Broccoli. Cabbage. Carrots. Eggplant. Green beans. Chard. Kale. Spinach. Onions. Leeks. Peas. Squash. Tomatoes. Peppers. Radishes. Fruits: Apples. Apricots. Avocado. Berries. Bananas. Cherries. Dates. Figs. Grapes. Lemons. Melon. Oranges. Peaches. Plums. Pomegranate. Meats and other protein foods: Beans. Almonds. Sunflower seeds. Pine nuts. Peanuts. Cod. Salmon. Scallops. Shrimp. Tuna. Tilapia. Clams. Oysters. Eggs. Dairy: Low-fat milk. Cheese. Greek yogurt. Beverages: Water. Red wine. Herbal tea. Fats and oils: Extra virgin olive oil. Avocado oil. Grape seed oil. Sweets and desserts: Greek yogurt with honey. Baked apples. Poached pears. Trail mix. Seasoning and other foods: Basil. Cilantro. Coriander. Cumin. Mint. Parsley. Sage. Rosemary. Tarragon. Garlic. Oregano. Thyme. Pepper. Balsalmic vinegar. Tahini. Hummus. Tomato sauce. Olives. Mushrooms. Limit these Grains: Prepackaged pasta or rice dishes. Prepackaged cereal with added sugar. Vegetables: Deep fried potatoes (french fries). Fruits: Fruit canned in syrup. Meats and other protein foods: Beef. Pork. Lamb. Poultry with skin. Hot dogs. [place]. Dairy: Ice cream. Sour cream. Whole milk. Beverages: Juice. Sugar-sweetened soft drinks. Beer. Liquor and spirits. Fats and oils: Butter. Canola oil. Vegetable  oil. Beef fat (tallow). Lard. Sweets and desserts: Cookies. Cakes. Pies. Candy. Seasoning and other foods: Mayonnaise. Premade sauces and marinades. The items listed may not be a complete list. Talk with your dietitian about what dietary choices are right for you. Summary The Mediterranean diet includes both food and lifestyle choices. Eat a variety of fresh fruits and vegetables, beans, nuts, seeds, and whole grains. Limit the amount of red meat and sweets that  you eat. Talk with your health care provider about whether it is safe for you to drink red wine in moderation. This means 1 glass a day for nonpregnant women and 2 glasses a day for men. A glass of wine equals 5 oz (150 mL). This information is not intended to replace advice given to you by your health care provider. Make sure you discuss any questions you have with your health care provider. Document Released: 01/05/2016 Document Revised: 02/07/2016 Document Reviewed: 01/05/2016 Elsevier Interactive Patient Education  2017 [employer].

## 2023-06-08 ENCOUNTER — Other Ambulatory Visit: Payer: Self-pay | Admitting: Internal Medicine

## 2023-06-08 DIAGNOSIS — I5022 Chronic systolic (congestive) heart failure: Secondary | ICD-10-CM

## 2023-06-11 ENCOUNTER — Other Ambulatory Visit: Payer: Self-pay | Admitting: Family Medicine

## 2023-06-11 ENCOUNTER — Encounter (HOSPITAL_BASED_OUTPATIENT_CLINIC_OR_DEPARTMENT_OTHER): Payer: Self-pay

## 2023-06-11 ENCOUNTER — Ambulatory Visit (HOSPITAL_BASED_OUTPATIENT_CLINIC_OR_DEPARTMENT_OTHER)
Admission: RE | Admit: 2023-06-11 | Discharge: 2023-06-11 | Disposition: A | Discharge status: Home / Self Care | Payer: Medicare Other | Source: Ambulatory Visit | Attending: Internal Medicine | Admitting: Internal Medicine

## 2023-06-11 DIAGNOSIS — I7121 Aneurysm of the ascending aorta, without rupture: Secondary | ICD-10-CM

## 2023-06-11 MED ORDER — IOHEXOL 350 MG/ML SOLN: 100.0000 mL | Freq: Once | INTRAVENOUS | Status: DC | PRN

## 2023-06-18 DIAGNOSIS — L308 Other specified dermatitis: Secondary | ICD-10-CM | POA: Diagnosis not present

## 2023-06-18 DIAGNOSIS — Z85828 Personal history of other malignant neoplasm of skin: Secondary | ICD-10-CM | POA: Diagnosis not present

## 2023-06-18 DIAGNOSIS — L570 Actinic keratosis: Secondary | ICD-10-CM | POA: Diagnosis not present

## 2023-06-18 DIAGNOSIS — D0439 Carcinoma in situ of skin of other parts of face: Secondary | ICD-10-CM | POA: Diagnosis not present

## 2023-06-18 DIAGNOSIS — D485 Neoplasm of uncertain behavior of skin: Secondary | ICD-10-CM | POA: Diagnosis not present

## 2023-06-19 ENCOUNTER — Ambulatory Visit: Payer: Medicare Other

## 2023-07-22 ENCOUNTER — Other Ambulatory Visit: Payer: Self-pay | Admitting: Internal Medicine

## 2023-07-22 DIAGNOSIS — I5022 Chronic systolic (congestive) heart failure: Secondary | ICD-10-CM

## 2023-07-27 DIAGNOSIS — L309 Dermatitis, unspecified: Secondary | ICD-10-CM | POA: Diagnosis not present

## 2023-07-29 ENCOUNTER — Encounter: Payer: Self-pay | Admitting: Family Medicine

## 2023-07-29 ENCOUNTER — Ambulatory Visit (INDEPENDENT_AMBULATORY_CARE_PROVIDER_SITE_OTHER): Admitting: Family Medicine

## 2023-07-29 VITALS — BP 132/66 | HR 59 | Temp 97.6°F | Resp 18 | Ht 71.0 in | Wt 204.0 lb

## 2023-07-29 DIAGNOSIS — L309 Dermatitis, unspecified: Secondary | ICD-10-CM

## 2023-07-29 DIAGNOSIS — I152 Hypertension secondary to endocrine disorders: Secondary | ICD-10-CM | POA: Diagnosis not present

## 2023-07-29 DIAGNOSIS — E1159 Type 2 diabetes mellitus with other circulatory complications: Secondary | ICD-10-CM

## 2023-07-29 MED ORDER — LEVOCETIRIZINE DIHYDROCHLORIDE 5 MG PO TABS
5.0000 mg | ORAL_TABLET | Freq: Every evening | ORAL | 0 refills | Status: DC
Start: 2023-07-29 — End: 2023-08-22

## 2023-07-29 MED ORDER — TRIAMCINOLONE ACETONIDE 0.1 % EX OINT: 1.0000 | TOPICAL_OINTMENT | Freq: Two times a day (BID) | CUTANEOUS | 0 refills | Status: DC

## 2023-07-29 MED ORDER — METHYLPREDNISOLONE ACETATE 80 MG/ML IJ SUSP
60.0000 mg | Freq: Once | INTRAMUSCULAR | Status: AC
Start: 2023-07-29 — End: 2023-07-29
  Administered 2023-07-29: 60 mg via INTRAMUSCULAR

## 2023-07-29 NOTE — Progress Notes (Signed)
 Assessment & Plan:  1. Dermatitis (Primary) Education provided on contact dermatitis.  Continue CeraVe lotion.  Depo-Medrol given IM in office.  Cautioned with the use of Benadryl and risk for falls. - methylPREDNISolone acetate (DEPO-MEDROL) injection 60 mg - levocetirizine (XYZAL) 5 MG tablet; Take 1 tablet (5 mg total) by mouth every evening.  Dispense: 30 tablet; Refill: 0 - triamcinolone ointment (KENALOG) 0.1 %; Apply 1 Application topically 2 (two) times daily.  Dispense: 80 g; Refill: 0   Follow up plan: Return if symptoms worsen or fail to improve.  Deliah Boston, MSN, APRN, FNP-C  Subjective:  HPI: [name] is a 83 y.o. male presenting on 07/29/2023 for Rash (Arms only - started last week - unsure of new exposures )  Patient is accompanied by his lady friend, who he is okay with being present.  Patient reports an itchy rash on his arms that started last week. Denies any new exposures outside, clothes, laundry detergent, soap, lotion, etc. States this happened before and he couldn't identify that cause, but it went away. He has been applying CeraVe and triamcinolone cream which have been effective in easing the itching.    ROS: Negative unless specifically indicated above in HPI.   Relevant past medical history reviewed and updated as indicated.   Allergies and medications reviewed and updated.   Current Outpatient Medications:    Accu-Chek Softclix Lancets lancets, Use to check blood sugar three times a day., Disp: 300 each, Rfl: 4   acetaminophen (TYLENOL) 500 MG tablet, Take 500-1,000 mg by mouth every 6 (six) hours as needed (pain)., Disp: , Rfl:    apixaban (ELIQUIS) 5 MG TABS tablet, TAKE 1 TABLET BY MOUTH TWICE A DAY, Disp: 60 tablet, Rfl: 5   baclofen (LIORESAL) 10 MG tablet, TAKE 0.5 TABLETS BY MOUTH 2 TIMES DAILY., Disp: 90 tablet, Rfl: 1   carvedilol (COREG) 25 MG tablet, Take 1 tablet (25 mg total) by mouth 2 (two) times daily., Disp: 180 tablet, Rfl: 2    doxycycline (VIBRA-TABS) 100 MG tablet, Take 1 tablet (100 mg total) by mouth 2 (two) times daily., Disp: 30 tablet, Rfl: 0   escitalopram (LEXAPRO) 10 MG tablet, Take 1 tablet (10 mg total) by mouth daily., Disp: 90 tablet, Rfl: 3   furosemide (LASIX) 20 MG tablet, TAKE 2 TABLETS (40 MG TOTAL) BY MOUTH DAILY., Disp: 180 tablet, Rfl: 3   gabapentin (NEURONTIN) 100 MG capsule, Take 1 capsule (100 mg total) by mouth 3 (three) times daily. TAKE 1 CAPSULE BY MOUTH THREE TIMES A DAY Strength: 100 mg, Disp: 270 capsule, Rfl: 3   glucose blood (ACCU-CHEK AVIVA PLUS) test strip, Use 3 times daily. Morning, noon, and nightly to check glucose levels Dx: E11.9 (type 2 diabetes mellitus), Disp: 100 strip, Rfl: 11   halobetasol (ULTRAVATE) 0.05 % cream, APPLY TO AFFECTED AREA TWICE A DAY, Disp: 60 g, Rfl: 2   LORazepam (ATIVAN) 0.5 MG tablet, Take 1 tablet (0.5 mg total) by mouth 2 (two) times daily as needed for anxiety., Disp: 15 tablet, Rfl: 0   memantine (NAMENDA) 5 MG tablet, Take 1 table twice a day, Disp: 180 tablet, Rfl: 3   metFORMIN (GLUCOPHAGE) 1000 MG tablet, TAKE 1 TABLET BY MOUTH EVERY DAY WITH BREAKFAST, Disp: 90 tablet, Rfl: 0   mupirocin ointment (BACTROBAN) 2 %, Apply 1 Application topically 2 (two) times daily., Disp: 30 g, Rfl: 1   nystatin cream (MYCOSTATIN), Apply 1 Application topically 2 (two) times daily as needed (  skin irritation)., Disp: 30 g, Rfl: 2   pantoprazole (PROTONIX) 40 MG tablet, TAKE 1 TABLET BY MOUTH EVERY DAY, Disp: 90 tablet, Rfl: 0   potassium chloride SA (KLOR-CON M20) 20 MEQ tablet, TAKE 2 TABLETS BY MOUTH EVERY MORNING, Disp: 180 tablet, Rfl: 3   sacubitril-valsartan (ENTRESTO) 49-51 MG, TAKE 1 TABLET BY MOUTH TWICE A DAY, Disp: 180 tablet, Rfl: 3   solifenacin (VESICARE) 10 MG tablet, TAKE 1 TABLET BY MOUTH EVERY DAY, Disp: 90 tablet, Rfl: 3   tamsulosin (FLOMAX) 0.4 MG CAPS capsule, TAKE 1 CAPSULE BY MOUTH EVERY DAY, Disp: 90 capsule, Rfl: 3   triamcinolone ointment  (KENALOG) 0.1 %, Apply 1 Application topically 2 (two) times daily., Disp: 90 g, Rfl: 1   Vitamin D, Ergocalciferol, (DRISDOL) 1.25 MG (50000 UNIT) CAPS capsule, Take 1 capsule (50,000 Units total) by mouth every 7 (seven) days., Disp: 12 capsule, Rfl: 0  Allergies  Allergen Reactions   Lisinopril Cough    Objective:   BP 132/66   Pulse (!) 59   Temp 97.6 F (36.4 C)   Resp 18   Ht 5\' 11"  (1.803 m)   Wt 204 lb (92.5 kg)   SpO2 96%   BMI 28.45 kg/m    Physical Exam Vitals reviewed.  Constitutional:      General: He is not in acute distress.    Appearance: Normal appearance. He is not ill-appearing, toxic-appearing or diaphoretic.  HENT:     Head: Normocephalic and atraumatic.  Eyes:     General: No scleral icterus.       Right eye: No discharge.        Left eye: No discharge.     Conjunctiva/sclera: Conjunctivae normal.  Cardiovascular:     Rate and Rhythm: Normal rate.  Pulmonary:     Effort: Pulmonary effort is normal. No respiratory distress.  Musculoskeletal:        General: Normal range of motion.     Cervical back: Normal range of motion.     Right Lower Extremity: Right leg is amputated below knee.  Skin:    General: Skin is warm and dry.     Findings: Bruising (left forearm from scratching) and rash present. Rash is macular (bilateral arms).  Neurological:     Mental Status: He is alert and oriented to person, place, and time. Mental status is at baseline.     Gait: Gait abnormal (ambulates with a cane).  Psychiatric:        Mood and Affect: Mood normal.        Behavior: Behavior normal.        Thought Content: Thought content normal.        Judgment: Judgment normal.

## 2023-08-16 ENCOUNTER — Other Ambulatory Visit: Payer: Self-pay | Admitting: Family Medicine

## 2023-08-20 ENCOUNTER — Other Ambulatory Visit: Payer: Self-pay | Admitting: Family Medicine

## 2023-08-20 DIAGNOSIS — L309 Dermatitis, unspecified: Secondary | ICD-10-CM

## 2023-08-21 ENCOUNTER — Ambulatory Visit: Payer: Medicare Other

## 2023-08-28 IMAGING — MR MR FOOT*L* W/O CM
4 of 5 series · 30 of 40 positions shown · non-contrast
Comparison: 10/12/2021

CLINICAL DATA: Left foot diabetic ulcer. Evaluate for
osteomyelitis.

EXAM:
MRI OF THE LEFT FOOT WITHOUT CONTRAST
TECHNIQUE: Multiplanar, multisequence MR imaging of the left foot was
performed. No intravenous contrast was administered.

[Series 4: T1 · coronal · 4.0mm · 0.38mm/px · 9 of 40 slices shown (1 of 2)]
[im 1/40]
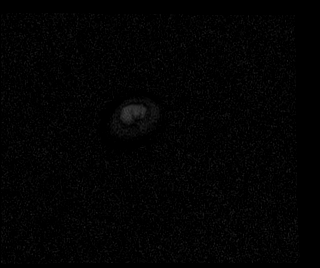
[im 5/40]
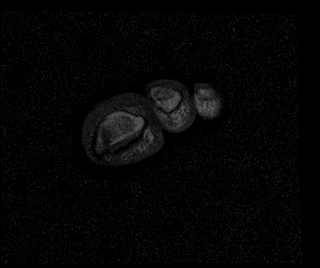
[im 10/40]
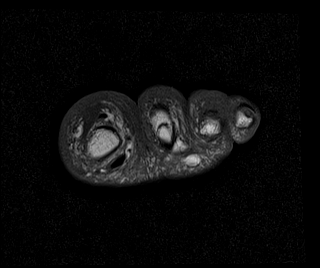
[im 15/40]
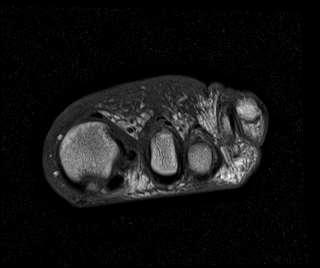
[im 20/40]
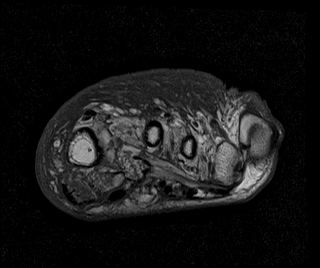
[im 25/40]
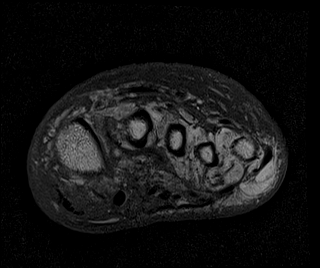
[im 30/40]
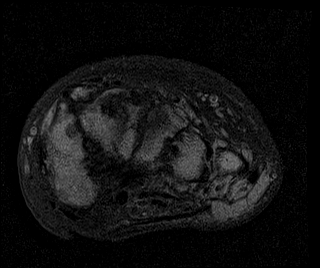
[im 35/40]
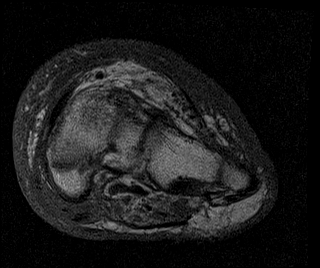
[im 40/40]
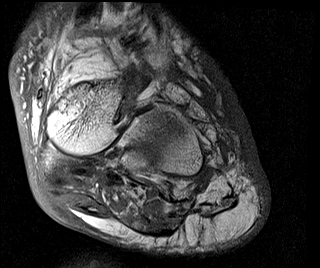

[Series 5: T2 fat-sat · coronal · 4.0mm · 0.23mm/px · 9 of 40 slices shown (1 of 2)]
[im 1/40]
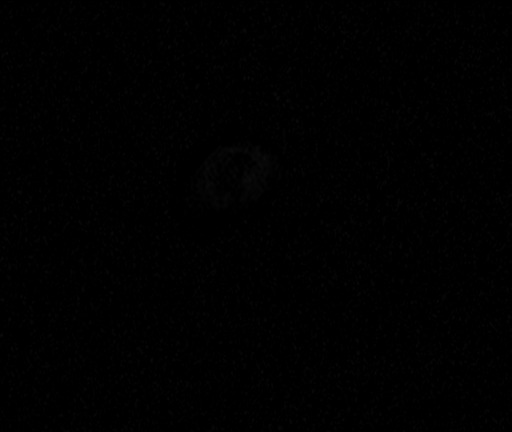
[im 5/40]
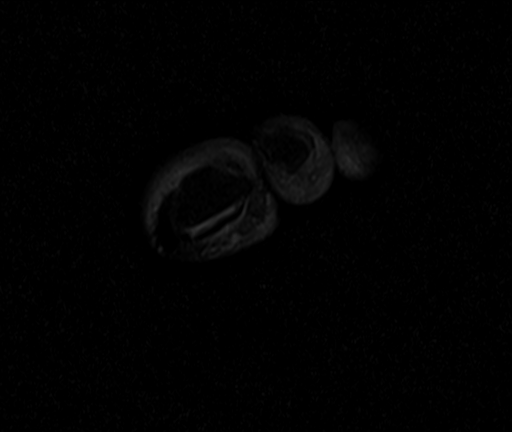
[im 10/40]
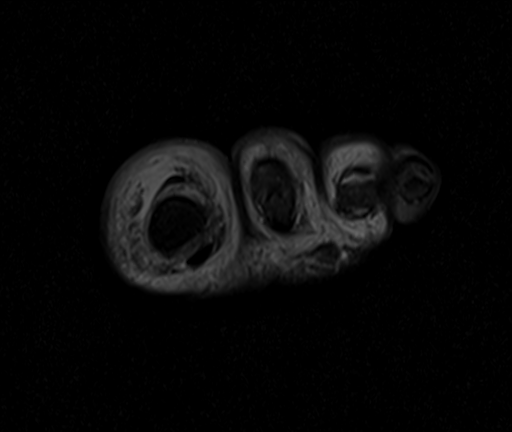
[im 15/40]
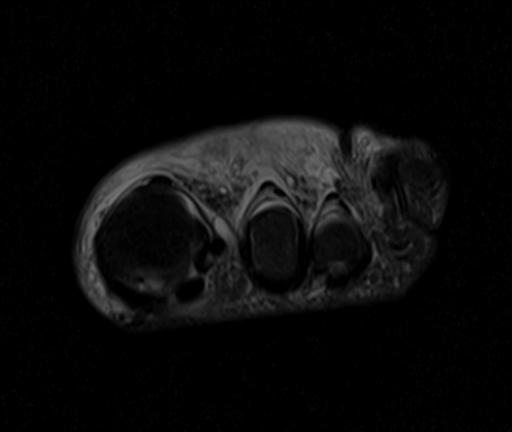
[im 20/40]
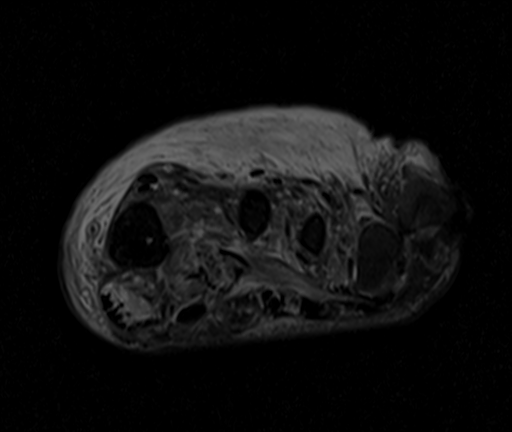
[im 25/40]
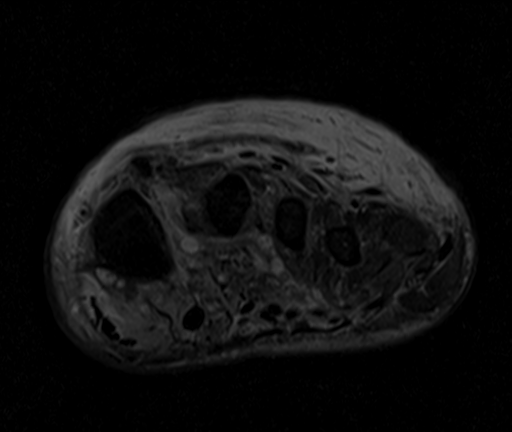
[im 30/40]
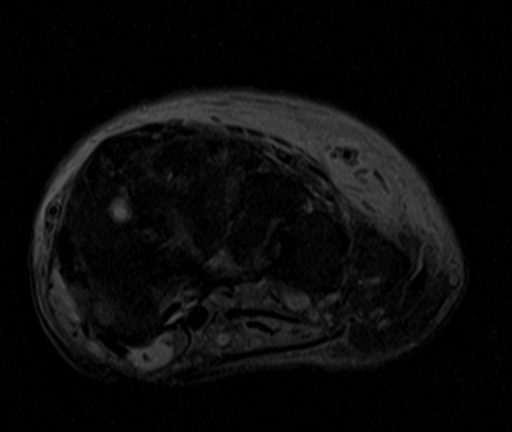
[im 35/40]
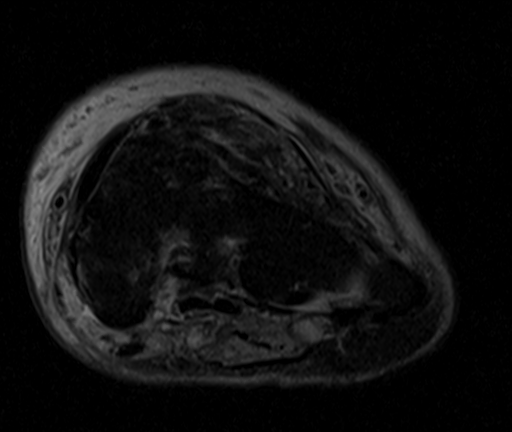
[im 40/40]
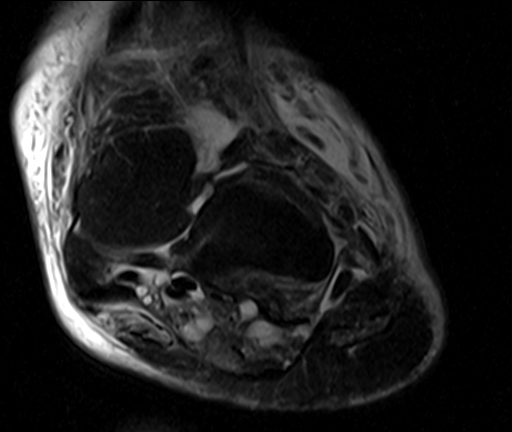

[Series 6: T2 fat-sat · axial · 3.0mm · 0.35mm/px · z∈[-54,+23]mm · 7 of 27 slices shown (2 of 2)]
[im 1/27]
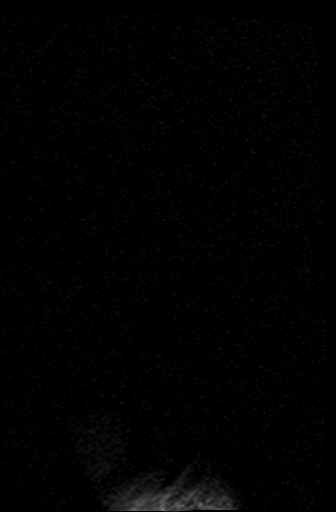
[im 5/27]
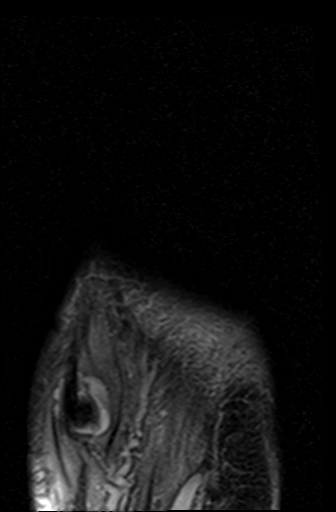
[im 9/27]
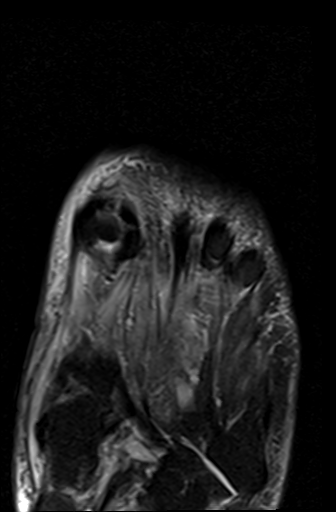
[im 14/27]
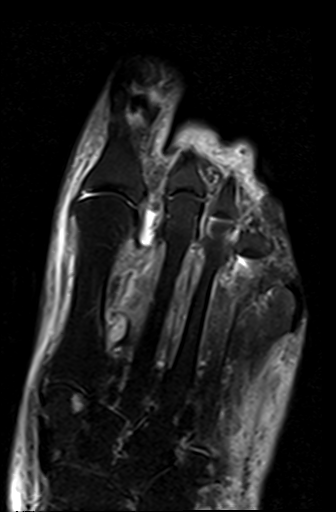
[im 18/27]
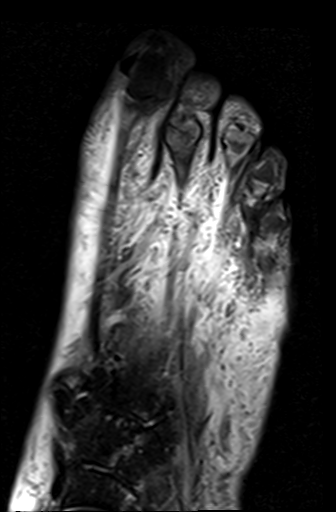
[im 22/27]
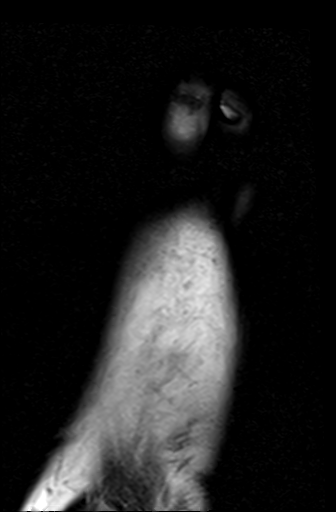
[im 27/27]
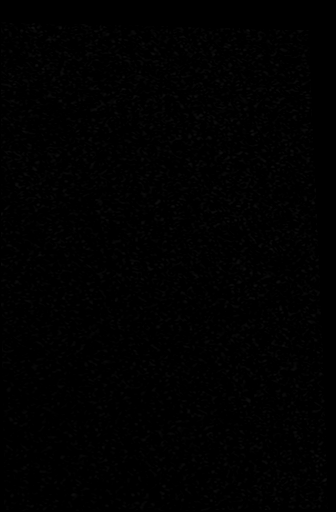

[Series 7: T1 · axial · 3.0mm · 0.35mm/px · z∈[-54,+8]mm · 5 of 27 slices shown (2 of 2)]
[im 1/27]
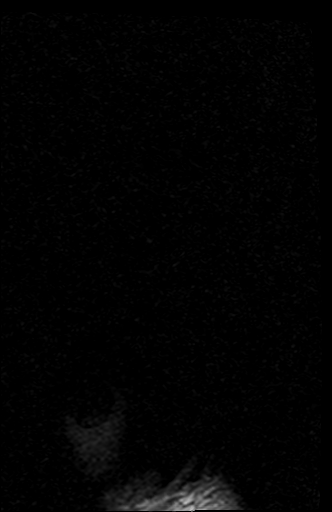
[im 5/27]
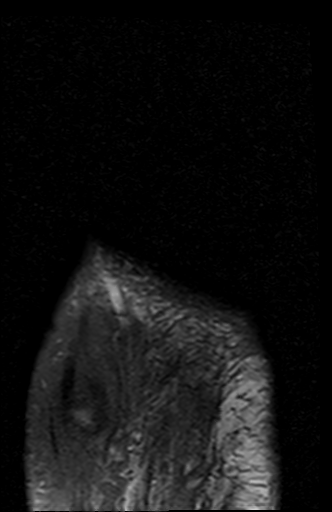
[im 9/27]
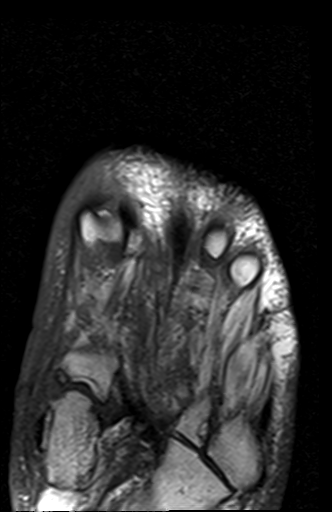
[im 14/27]
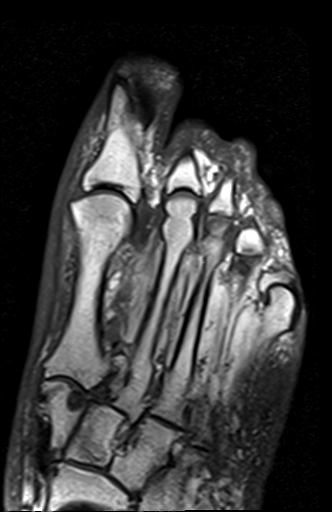
[im 22/27]
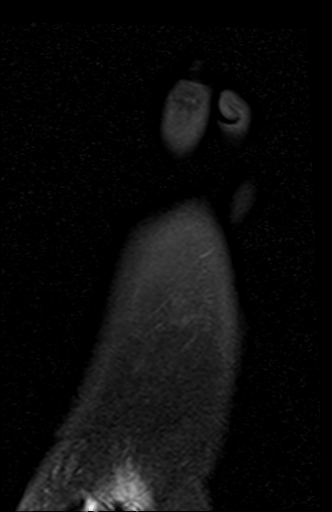

[30 of 40 positions shown; findings below may reference images not displayed]

FINDINGS: Bones/Joint/Cartilage

No fracture or dislocation. Normal alignment. No joint effusion.

Severe osteoarthritis of the first TMT joint. Moderate
osteoarthritis of the second tarsometatarsal joint. Mild
osteoarthritis of the third and fourth tarsometatarsal joint.
Moderate osteoarthritis of the navicular-cuneiform joint. Severe
degenerative changes of the intermetatarsal joint of the first and
second metatarsal bases.

Ligaments

Collateral ligaments are intact.  Lisfranc ligament is intact.

Muscles and Tendons

Flexor, peroneal and extensor compartment tendons are intact.
Muscles are normal.

Soft tissue
No fluid collection or hematoma. No soft tissue mass. Severe soft
tissue edema along the dorsal aspect of the foot concerning for
cellulitis. Small fluid collection measuring 12 mm along the plantar
aspect of the first TMT joint concerning for an adventitial bursa or
ganglion cyst.
IMPRESSION: 1. No evidence of osteomyelitis of the left foot.
2. Severe soft tissue edema along the dorsal aspect of the foot
concerning for cellulitis.
3. Small fluid collection measuring 12 mm along the plantar aspect
of the first TMT joint concerning for an adventitial bursa or
ganglion cyst.
4. Severe osteoarthritis of the first TMT joint.
5. Moderate osteoarthritis of the second tarsometatarsal joint.
6. Mild osteoarthritis of the third and fourth tarsometatarsal
joint. Moderate osteoarthritis of the navicular-cuneiform joint.
Severe degenerative changes of the intermetatarsal joint of the
first and second metatarsal bases.

## 2023-09-02 DIAGNOSIS — C44329 Squamous cell carcinoma of skin of other parts of face: Secondary | ICD-10-CM | POA: Diagnosis not present

## 2023-09-02 DIAGNOSIS — Z85828 Personal history of other malignant neoplasm of skin: Secondary | ICD-10-CM | POA: Diagnosis not present

## 2023-09-20 ENCOUNTER — Encounter: Payer: Self-pay | Admitting: Physician Assistant

## 2023-09-27 ENCOUNTER — Encounter: Payer: Self-pay | Admitting: Physician Assistant

## 2023-09-27 ENCOUNTER — Other Ambulatory Visit: Payer: Self-pay | Admitting: Physician Assistant

## 2023-09-27 MED ORDER — MEMANTINE HCL 10 MG PO TABS: ORAL_TABLET | ORAL | 3 refills | Status: AC

## 2023-09-29 ENCOUNTER — Other Ambulatory Visit: Payer: Self-pay | Admitting: Internal Medicine

## 2023-09-29 DIAGNOSIS — I4891 Unspecified atrial fibrillation: Secondary | ICD-10-CM

## 2023-09-30 NOTE — Telephone Encounter (Signed)
 Prescription refill request for Eliquis  received. Indication:afib Last office visit:12/24 Scr:1.08  9/24 Age: 83 Weight:92.5  kg  Prescription refilled

## 2023-10-08 DIAGNOSIS — L570 Actinic keratosis: Secondary | ICD-10-CM | POA: Diagnosis not present

## 2023-10-08 DIAGNOSIS — Z85828 Personal history of other malignant neoplasm of skin: Secondary | ICD-10-CM | POA: Diagnosis not present

## 2023-10-25 ENCOUNTER — Encounter (HOSPITAL_BASED_OUTPATIENT_CLINIC_OR_DEPARTMENT_OTHER): Payer: Self-pay | Admitting: Internal Medicine

## 2023-10-25 DIAGNOSIS — I5022 Chronic systolic (congestive) heart failure: Secondary | ICD-10-CM

## 2023-10-25 MED ORDER — CARVEDILOL 25 MG PO TABS
25.0000 mg | ORAL_TABLET | Freq: Two times a day (BID) | ORAL | 3 refills | Status: AC
Start: 2023-10-25 — End: ?

## 2023-10-25 NOTE — Addendum Note (Signed)
 Addended by: Guss Legacy on: 10/25/2023 07:30 AM   Modules accepted: Orders

## 2023-11-17 ENCOUNTER — Other Ambulatory Visit: Payer: Self-pay | Admitting: Family Medicine

## 2023-12-06 ENCOUNTER — Ambulatory Visit: Payer: Medicare Other | Admitting: Physician Assistant

## 2023-12-13 ENCOUNTER — Ambulatory Visit: Admitting: Physician Assistant

## 2023-12-13 NOTE — Progress Notes (Incomplete)
 Assessment/Plan:   Dementia likely due to ***  [name] is a very pleasant 83 y.o. RH male with a history of hypertension, hyperlipidemia, CSCHF, NICM, Afib on Eliquis   DM2 with neuropathy, OA, GERD, B12 deficiency, anxiety seen today in follow up for memory loss. Patient is currently on memantine  10 mg twice daily, tolerating well (bradycardia).  To date, he has been unable to perform his MRI of the brain due to chronic back issues as he could not lie down properly.  He may be able to have that perform an open MRI, as suggested before at which time he wanted to delay it.***.  Patient is able to participate on ADLs with some limitations due to orthopedic issues.  He continues to drive short distances without any problems.  Mood is controlled with Lexapro .    Follow up in   months. Recommend proceeding with MRI of the brain for further evaluation of structural abnormalities and vascular load. Continue memantine  10 mg twice daily, side effects discussed Continue B12 supplements*** Recommend good control of her cardiovascular risk factors, follow-up with cardiology Continue to control mood as per PCP If the patient is able to provide further testing effort, we will proceed with neuropsych evaluation for diagnostic clarity     Subjective:    This patient is accompanied in the office by his daughter*** who supplements the history.  Previous records as well as any outside records available were reviewed prior to todays visit. Patient was last seen on 06/04/2023 with MMSE 21/30***   Any changes in memory since last visit? .  As before, he has difficulty with with new information, recent conversations and names.  Long-term memory is good. repeats oneself?  Endorsed, especially with appointments Disoriented when walking into a room? Denies ***  Leaving objects?  May misplace things such as the phone but not in unusual places***  Wandering behavior?  denies   Any personality changes since  last visit?  Denies.  He still has some moments of frustration, but less temper episodes Any worsening depression?:  Denies.   Hallucinations or paranoia?  Denies.   Seizures? denies    Any sleep changes?  Denies vivid dreams, REM behavior or sleepwalking   Sleep apnea?   Denies.   Any hygiene concerns? Denies.  Independent of bathing and dressing?  Endorsed  Does the patient needs help with medications?  Daughter is in charge *** Who is in charge of the finances?  Daughter is in charge   *** Any changes in appetite?  denies.  He does not drink enough water, with coffee and Pepsi per daughter's report***   Patient have trouble swallowing? Denies.   Does the patient cook? No Any headaches?   denies   Any vision changes?*** Chronic back pain due to osteoarthritis Ambulates with difficulty? Denies.  *** Recent falls or head injuries? Denies.     Unilateral weakness, numbness or tingling? denies   Any tremors?  Only when anxious *** Any anosmia?  Denies   Any incontinence of urine?  Endorsed***  Any bowel dysfunction?   Denies      Patient lives alone, family is living to independent living versus ALF   *** Does the patient drive?  Short distances, without any issues.  He denies getting lost***   PREVIOUS MEDICATIONS:   CURRENT MEDICATIONS:  Outpatient Encounter Medications as of 12/13/2023  Medication Sig   Accu-Chek Softclix Lancets lancets Use to check blood sugar three times a day.   acetaminophen  (  TYLENOL ) 500 MG tablet Take 500-1,000 mg by mouth every 6 (six) hours as needed (pain).   baclofen  (LIORESAL ) 10 MG tablet TAKE 0.5 TABLETS BY MOUTH 2 TIMES DAILY.   carvedilol  (COREG ) 25 MG tablet Take 1 tablet (25 mg total) by mouth 2 (two) times daily.   doxycycline  (VIBRA -TABS) 100 MG tablet Take 1 tablet (100 mg total) by mouth 2 (two) times daily.   ELIQUIS  5 MG TABS tablet TAKE 1 TABLET BY MOUTH TWICE A DAY   escitalopram  (LEXAPRO ) 10 MG tablet Take 1 tablet (10 mg total) by  mouth daily.   furosemide  (LASIX ) 20 MG tablet TAKE 2 TABLETS (40 MG TOTAL) BY MOUTH DAILY.   gabapentin  (NEURONTIN ) 100 MG capsule Take 1 capsule (100 mg total) by mouth 3 (three) times daily. TAKE 1 CAPSULE BY MOUTH THREE TIMES A DAY Strength: 100 mg   glucose blood (ACCU-CHEK AVIVA PLUS) test strip Use 3 times daily. Morning, noon, and nightly to check glucose levels Dx: E11.9 (type 2 diabetes mellitus)   halobetasol  (ULTRAVATE ) 0.05 % cream APPLY TO AFFECTED AREA TWICE A DAY   levocetirizine (XYZAL ) 5 MG tablet TAKE 1 TABLET BY MOUTH EVERY DAY IN THE EVENING   LORazepam  (ATIVAN ) 0.5 MG tablet Take 1 tablet (0.5 mg total) by mouth 2 (two) times daily as needed for anxiety.   memantine  (NAMENDA ) 10 MG tablet Take 1 table twice a day   metFORMIN  (GLUCOPHAGE ) 1000 MG tablet TAKE 1 TABLET BY MOUTH EVERY DAY WITH BREAKFAST   mupirocin  ointment (BACTROBAN ) 2 % Apply 1 Application topically 2 (two) times daily.   nystatin  cream (MYCOSTATIN ) Apply 1 Application topically 2 (two) times daily as needed (skin irritation).   pantoprazole  (PROTONIX ) 40 MG tablet TAKE 1 TABLET BY MOUTH EVERY DAY   potassium chloride  SA (KLOR-CON  M20) 20 MEQ tablet TAKE 2 TABLETS BY MOUTH EVERY MORNING   sacubitril -valsartan  (ENTRESTO ) 49-51 MG TAKE 1 TABLET BY MOUTH TWICE A DAY   solifenacin  (VESICARE ) 10 MG tablet TAKE 1 TABLET BY MOUTH EVERY DAY   tamsulosin  (FLOMAX ) 0.4 MG CAPS capsule TAKE 1 CAPSULE BY MOUTH EVERY DAY   triamcinolone  ointment (KENALOG ) 0.1 % Apply 1 Application topically 2 (two) times daily.   Vitamin D , Ergocalciferol , (DRISDOL ) 1.25 MG (50000 UNIT) CAPS capsule Take 1 capsule (50,000 Units total) by mouth every 7 (seven) days.   No facility-administered encounter medications on file as of 12/13/2023.       06/04/2023    5:00 PM  MMSE - Mini Mental State Exam  Orientation to time 4  Orientation to Place 5  Registration 3  Attention/ Calculation 0  Recall 0  Language- name 2 objects 2   Language- repeat 1  Language- follow 3 step command 3  Language- read & follow direction 1  Write a sentence 1  Copy design 1  Total score 21      03/04/2023    1:00 PM  Montreal Cognitive Assessment   Visuospatial/ Executive (0/5) 2  Naming (0/3) 3  Attention: Read list of digits (0/2) 2  Attention: Read list of letters (0/1) 0  Attention: Serial 7 subtraction starting at 100 (0/3) 1  Language: Repeat phrase (0/2) 0  Language : Fluency (0/1) 0  Abstraction (0/2) 1  Delayed Recall (0/5) 1  Orientation (0/6) 4  Total 14  Adjusted Score (based on education) 14    Objective:     PHYSICAL EXAMINATION:    VITALS:  There were no vitals filed for this visit.  GEN:  The patient appears stated age and is in NAD. HEENT:  Normocephalic, atraumatic.   Neurological examination:  General: NAD, well-groomed, appears stated age. Orientation: The patient is alert. Oriented to person, place and date Cranial nerves: There is good facial symmetry.The speech is fluent and clear. No aphasia or dysarthria. Fund of knowledge is appropriate. Recent and remote memory are impaired. Attention and concentration are reduced. Able to name objects and repeat phrases.  Hearing is intact to conversational tone. *** Sensation: Sensation is intact to light touch throughout on the left, he has right BKA Motor: Strength is at least antigravity x4. DTR's 2/4 in UE/LLE     Movement examination: Tone: There is normal tone in the UE/LLE Abnormal movements:  no tremor.  No myoclonus.  No asterixis.   Coordination:  There is no decremation with RAM's. Normal finger to nose  Gait and Station: The patient has no*** difficulty arising out of a deep-seated chair without the use of the hands. The patient's stride length is good, prosthesis  RLE.  Gait is cautious and wide based   Thank you for allowing us  the opportunity to participate in the care of this nice patient. Please do not hesitate to contact us  for  any questions or concerns.   Total time spent on today's visit was *** minutes dedicated to this patient today, preparing to see patient, examining the patient, ordering tests and/or medications and counseling the patient, documenting clinical information in the EHR or other health record, independently interpreting results and communicating results to the patient/family, discussing treatment and goals, answering patient's questions and coordinating care.  Cc:  Kennyth Worth HERO, MD  Camie Sevin 12/13/2023 6:00 AM

## 2024-01-10 ENCOUNTER — Ambulatory Visit: Admitting: Physician Assistant

## 2024-01-19 ENCOUNTER — Other Ambulatory Visit: Payer: Self-pay | Admitting: Family Medicine

## 2024-02-03 DIAGNOSIS — Z85828 Personal history of other malignant neoplasm of skin: Secondary | ICD-10-CM | POA: Diagnosis not present

## 2024-02-03 DIAGNOSIS — L570 Actinic keratosis: Secondary | ICD-10-CM | POA: Diagnosis not present

## 2024-02-03 DIAGNOSIS — D485 Neoplasm of uncertain behavior of skin: Secondary | ICD-10-CM | POA: Diagnosis not present

## 2024-02-03 DIAGNOSIS — D045 Carcinoma in situ of skin of trunk: Secondary | ICD-10-CM | POA: Diagnosis not present

## 2024-02-03 DIAGNOSIS — L821 Other seborrheic keratosis: Secondary | ICD-10-CM | POA: Diagnosis not present

## 2024-02-15 ENCOUNTER — Other Ambulatory Visit: Payer: Self-pay | Admitting: Family Medicine

## 2024-02-16 ENCOUNTER — Other Ambulatory Visit: Payer: Self-pay | Admitting: Family Medicine

## 2024-02-17 ENCOUNTER — Other Ambulatory Visit: Payer: Self-pay | Admitting: Family Medicine

## 2024-02-18 ENCOUNTER — Telehealth: Payer: Self-pay | Admitting: Internal Medicine

## 2024-02-18 NOTE — Telephone Encounter (Signed)
  Pt c/o of Chest Pain: STAT if active (IN THIS MOMENT) CP, including tightness, pressure, jaw pain, shoulder/upper arm/back pain, SOB, nausea, and vomiting.  1. Are you having CP right now (tightness, pressure, or discomfort)? Discomfort and not right now  2. Are you experiencing any other symptoms (ex. SOB, nausea, vomiting, sweating)? no  3. How long have you been experiencing CP? For a while  4. Is your CP continuous or coming and going? Comes and goes  5. Have you taken Nitroglycerin ? no  6. If CP returns before callback, please consider calling 911. ?

## 2024-02-18 NOTE — Telephone Encounter (Signed)
 I called the patient and was able to speak with his daughter. She is describing her father's chest discomfort as something that doesn't have a pattern, it comes and goes, and there are no other symptoms associated with it. He is taking al of his medications as prescribed and his blood pressure/HR readings are within normal range. They just wanted Dr. Mona to know of this and see if he wanted to see him sooner than his December appointment.   I gave ED precautions and will forward this to Dr. Mona.

## 2024-02-20 ENCOUNTER — Ambulatory Visit (INDEPENDENT_AMBULATORY_CARE_PROVIDER_SITE_OTHER): Admitting: Physician Assistant

## 2024-02-20 ENCOUNTER — Encounter: Payer: Self-pay | Admitting: Physician Assistant

## 2024-02-20 VITALS — BP 106/60 | HR 60 | Resp 20 | Ht 71.0 in | Wt 199.0 lb

## 2024-02-20 DIAGNOSIS — R413 Other amnesia: Secondary | ICD-10-CM

## 2024-02-20 NOTE — Progress Notes (Signed)
 Assessment/Plan:    Memory impairment of unclear etiology  [name] is a very pleasant 83 y.o. LH male with a history of hypertension, hyperlipidemia, CSCHF, NICM, Afib on Eliquis   DM2 with neuropathy, OA, GERD, B12 deficiency,  presenting today in follow-up for evaluation of memory loss. Patient is on memantine  10 mg twice daily, tolerating well (bradycardia).  He is yet to have an MRI due to high anxiety, we entertain the idea of open MRI, he wants to delay the procedure.  We explained that it is very necessary to have imaging done in order to see any structural abnormalities and to further evaluate his vascular load. He agrees to proceed. He has difficulty with ADLs due to orthopedic limitations.  Continues to drive short distances without any issues.  Mood is controlled with Lexapro  by PCP.   Recommendations:   Follow up in 6  months. Recommend MRI of the brain for further evaluation of structural abnormalities and vascular load  Continue memantine  10 mg twice daily, side effects discussed Continue B12 supplements Recommend good control of cardiovascular risk factors.  Continue Eliquis . Continue to control mood as per PCP If the patient is able to prove by better testing effort, we will schedule neuropsych evaluation for diagnostic clarity.    Subjective:   This patient is accompanied in the office by his daughter who supplements the history. Previous records as well as any outside records available were reviewed prior to todays visit.  Patient was last seen on 06/04/2023 with MMSE 21/30 .    Any changes in memory since last visit?  The pill is helping, he is doing much better.  He does report some difficulty remembering new information, recent conversations, names, places that he has been. LTM is good. Likes to spend time to his lady friend that I know since 4 th grade, Bobbie. repeats oneself?  Endorsed, especially with appointments Disoriented when walking into a room?   Patient denies    Misplacing objects?  He may misplace the phone, but he finds it quickly.  Wandering behavior?   Denies. Any personality changes since last visit? Denies.  He has some moments of frustration, but less temper changes since he is on Lexapro .  Any worsening depression?: denies.   Hallucinations or paranoia?  Denies.   Seizures?   Denies.    Any sleep changes? Sleeps well. Denies vivid dreams, REM behavior or sleepwalking   Sleep apnea?   denies   Any hygiene concerns?   Denies.   Independent of bathing and dressing?  Endorsed  Does the patient needs help with medications?  Daughter is in charge  Who is in charge of the finances?  Daughter is in charge    Any changes in appetite?  denies.  He drinks plenty water but he also likes coffee and [employer]epsi , daughter says   Patient have trouble swallowing?  Denies.   Does the patient cook?  No   Any headaches?    Denies.   Vision changes? Denies. Chronic pain?  Denies.   Ambulates with difficulty?  He has chronic low back pain right below the knee amputation due to gangrenous diabetic ulcer in 2017 due to osteoarthritis which may limit his mobility, uses left cane to ambulate for stability, and at home he uses a walker   Recent falls or head injuries?    Denies.      Unilateral weakness, numbness or tingling?  Denies.   Any tremors?  Denies.   Any anosmia?  Denies.   Any incontinence of urine?  Denies.   Any bowel dysfunction?  Denies.      Patient lives, family is looking into independent living versus ALF.  Does the patient drive?  Yes, only short distances   Past Medical History:  Diagnosis Date   CAD (coronary artery disease)    a. Remote cath 2008 for chest pain showed 30% LAD with luminal irregularity and fairly heavy calcification in the mid LAD without critical stenosis, 30% OM1, 30% acute marginal.   Cancer (HCC)    skin cancer - basal   Cataract    both   Chronic combined systolic and diastolic CHF (congestive  heart failure) (HCC)    Complication of anesthesia    Diabetes mellitus    Type II   ED (erectile dysfunction)    History of kidney stones    x1   Hypertension    NICM (nonischemic cardiomyopathy) (HCC)    a. EF 40-45% by echo in 08/2017 - prior low risk stress test in 2017.   Osteoarthritis    Paroxysmal SVT (supraventricular tachycardia)    PONV (postoperative nausea and vomiting) 1960   with Ether   PVC's (premature ventricular contractions)    PVD (peripheral vascular disease)    s/p R BKA   Rheumatic fever    Rhinitis    S/P BKA (below knee amputation), right [facility])      Past Surgical History:  Procedure Laterality Date   AMPUTATION Right 02/29/2016   Procedure: Right Leg AMPUTATION BELOW KNEE with Wound Vac placement;  Surgeon: Jerona LULLA Sage, MD;  Location: [facility] OR;  Service: Orthopedics;  Laterality: Right;   CARDIOVERSION N/A 06/08/2021   Procedure: CARDIOVERSION;  Surgeon: Jeffrie Oneil BROCKS, MD;  Location: [facility] ENDOSCOPY;  Service: Cardiovascular;  Laterality: N/A;   CHOLECYSTECTOMY  1983   colonoscopy  2006, 2010   EYE SURGERY Bilateral    cataract   INGUINAL HERNIA REPAIR Right 1960   LEFT HEART CATH AND CORONARY ANGIOGRAPHY N/A 06/03/2018   Procedure: LEFT HEART CATH AND CORONARY ANGIOGRAPHY;  Surgeon: Claudene Victory ORN, MD;  Location: [facility] INVASIVE CV LAB;  Service: Cardiovascular;  Laterality: N/A;   STUMP REVISION Right 10/30/2017   Procedure: RIGHT BELOW KNEE AMPUTATION REVISION;  Surgeon: Sage Jerona LULLA, MD;  Location: [facility] OR;  Service: Orthopedics;  Laterality: Right;     PREVIOUS MEDICATIONS:   CURRENT MEDICATIONS:  Outpatient Encounter Medications as of 02/20/2024  Medication Sig   Accu-Chek Softclix Lancets lancets Use to check blood sugar three times a day.   acetaminophen  (TYLENOL ) 500 MG tablet Take 500-1,000 mg by mouth every 6 (six) hours as needed (pain).   baclofen  (LIORESAL ) 10 MG tablet TAKE 0.5 TABLETS BY MOUTH 2 TIMES DAILY.   carvedilol  (COREG ) 25 MG tablet  Take 1 tablet (25 mg total) by mouth 2 (two) times daily.   doxycycline  (VIBRA -TABS) 100 MG tablet Take 1 tablet (100 mg total) by mouth 2 (two) times daily.   ELIQUIS  5 MG TABS tablet TAKE 1 TABLET BY MOUTH TWICE A DAY   escitalopram  (LEXAPRO ) 10 MG tablet TAKE 1 TABLET BY MOUTH EVERY DAY   furosemide  (LASIX ) 20 MG tablet TAKE 2 TABLETS (40 MG TOTAL) BY MOUTH DAILY.   gabapentin  (NEURONTIN ) 100 MG capsule Take 1 capsule (100 mg total) by mouth 3 (three) times daily. TAKE 1 CAPSULE BY MOUTH THREE TIMES A DAY Strength: 100 mg   glucose blood (ACCU-CHEK AVIVA PLUS) test strip Use 3 times daily. Morning,  noon, and nightly to check glucose levels Dx: E11.9 (type 2 diabetes mellitus)   halobetasol  (ULTRAVATE ) 0.05 % cream APPLY TO AFFECTED AREA TWICE A DAY   levocetirizine (XYZAL ) 5 MG tablet TAKE 1 TABLET BY MOUTH EVERY DAY IN THE EVENING   LORazepam  (ATIVAN ) 0.5 MG tablet Take 1 tablet (0.5 mg total) by mouth 2 (two) times daily as needed for anxiety.   memantine  (NAMENDA ) 10 MG tablet Take 1 table twice a day   metFORMIN  (GLUCOPHAGE ) 1000 MG tablet TAKE 1 TABLET BY MOUTH EVERY DAY WITH BREAKFAST   mupirocin  ointment (BACTROBAN ) 2 % Apply 1 Application topically 2 (two) times daily.   nystatin  cream (MYCOSTATIN ) Apply 1 Application topically 2 (two) times daily as needed (skin irritation).   pantoprazole  (PROTONIX ) 40 MG tablet TAKE 1 TABLET BY MOUTH EVERY DAY   potassium chloride  SA (KLOR-CON  M) 20 MEQ tablet TAKE 2 TABLETS BY MOUTH EVERY MORNING   sacubitril -valsartan  (ENTRESTO ) 49-51 MG TAKE 1 TABLET BY MOUTH TWICE A DAY   solifenacin  (VESICARE ) 10 MG tablet TAKE 1 TABLET BY MOUTH EVERY DAY   tamsulosin  (FLOMAX ) 0.4 MG CAPS capsule TAKE 1 CAPSULE BY MOUTH EVERY DAY   triamcinolone  ointment (KENALOG ) 0.1 % Apply 1 Application topically 2 (two) times daily.   Vitamin D , Ergocalciferol , (DRISDOL ) 1.25 MG (50000 UNIT) CAPS capsule Take 1 capsule (50,000 Units total) by mouth every 7 (seven) days.    No facility-administered encounter medications on file as of 02/20/2024.     Objective:     PHYSICAL EXAMINATION:    VITALS:   Vitals:   02/20/24 1442  BP: 106/60  Pulse: 60  Resp: 20  SpO2: 99%  Weight: 199 lb (90.3 kg)  Height: 5' 11 (1.803 m)    GEN:  The patient appears stated age and is in NAD. HEENT:  Normocephalic, atraumatic.   Neurological examination:  General: NAD, well-groomed, appears stated age. Orientation: The patient is alert. Oriented to person, place and not to date.  Cranial nerves: There is good facial symmetry. The speech is fluent and clear. No aphasia or dysarthria. Fund of knowledge is appropriate. Recent and remote memory impaired.  Attention and concentration are normal.  Able to name objects and repeat phrases.  Hearing is intact to conversational tone.   Delayed recall 0/3 Sensation: Sensation is intact to light touch throughout Motor: Strength is at least antigravity x4. DTR's 2/4 in UE/LE      03/04/2023    1:00 PM  Montreal Cognitive Assessment   Visuospatial/ Executive (0/5) 2  Naming (0/3) 3  Attention: Read list of digits (0/2) 2  Attention: Read list of letters (0/1) 0  Attention: Serial 7 subtraction starting at 100 (0/3) 1  Language: Repeat phrase (0/2) 0  Language : Fluency (0/1) 0  Abstraction (0/2) 1  Delayed Recall (0/5) 1  Orientation (0/6) 4  Total 14  Adjusted Score (based on education) 14       02/20/2024    3:00 PM 06/04/2023    5:00 PM  MMSE - Mini Mental State Exam  Orientation to time 5 4  Orientation to Place 4 5  Registration 3 3  Attention/ Calculation 0 0  Recall 0 0  Language- name 2 objects 2 2  Language- repeat 1 1  Language- follow 3 step command 3 3  Language- read & follow direction 1 1  Write a sentence 1 1  Copy design 1 1  Total score 21 21  Movement examination: Tone: There is normal tone in the UE/LE Abnormal movements:  no tremor.  No myoclonus.  No asterixis.   Coordination:   There is no decremation with RAM's. Normal finger to nose  Gait and Station: The patient has some difficulty arising out of a deep-seated chair without the use of the hands. The patient's stride length is good  RLE prosthesis.  Gait is cautious and slightly wide based  Thank you for allowing us  the opportunity to participate in the care of this nice patient. Please do not hesitate to contact us  for any questions or concerns.   Total time spent on today's visit was 30 minutes dedicated to this patient today, preparing to see patient, examining the patient, ordering tests and/or medications and counseling the patient, documenting clinical information in the EHR or other health record, independently interpreting results and communicating results to the patient/family, discussing treatment and goals, answering patient's questions and coordinating care.  Cc:  Kennyth Worth HERO, MD  Camie Sevin 02/20/2024 3:32 PM

## 2024-02-20 NOTE — Patient Instructions (Addendum)
 It was a pleasure to see you today at our office.   Recommendations:  Continue memantine  10 mg twice a day   Follow up in 6  months Proceed with the open  MRI [facility] [phone] Recommend visiting the website :  Dementia Success Path to better understand some behaviors related to memory loss.     Whom to call: Memory  decline, memory medications: Call our office [phone]    [email or web address]   RECOMMENDATIONS FOR ALL PATIENTS WITH MEMORY PROBLEMS: 1. Continue to exercise (Recommend 30 minutes of walking everyday, or 3 hours every week) 2. Increase social interactions - continue going to [place] and enjoy social gatherings with friends and family 3. Eat healthy, avoid fried foods and eat more fruits and vegetables 4. Maintain adequate blood pressure, blood sugar, and blood cholesterol level. Reducing the risk of stroke and cardiovascular disease also helps promoting better memory. 5. Avoid stressful situations. Live a simple life and avoid aggravations. Organize your time and prepare for the next day in anticipation. 6. Sleep well, avoid any interruptions of sleep and avoid any distractions in the bedroom that may interfere with adequate sleep quality 7. Avoid sugar, avoid sweets as there is a strong link between excessive sugar intake, diabetes, and cognitive impairment We discussed the Mediterranean diet, which has been shown to help patients reduce the risk of progressive memory disorders and reduces cardiovascular risk. This includes eating fish, eat fruits and green leafy vegetables, nuts like almonds and hazelnuts, walnuts, and also use olive oil. Avoid fast foods and fried foods as much as possible. Avoid sweets and sugar as sugar use has been linked to worsening of memory function.  There is always a concern of gradual progression of memory problems. If this is the case, then we may need to adjust level of care  according to patient needs. Support, both to the patient and caregiver, should then be put into place.      You have been referred for a neuropsychological evaluation (i.e., evaluation of memory and thinking abilities). Please bring someone with you to this appointment if possible, as it is helpful for the doctor to hear from both you and another adult who knows you well. Please bring eyeglasses and hearing aids if you wear them.    The evaluation will take approximately 3 hours and has two parts:   The first part is a clinical interview with the neuropsychologist (Dr. Richie or Dr. Jackquline). During the interview, the neuropsychologist will speak with you and the individual you brought to the appointment.    The second part of the evaluation is testing with the doctor's technician Neal or Luke). During the testing, the technician will ask you to remember different types of material, solve problems, and answer some questionnaires. Your family member will not be present for this portion of the evaluation.   Please note: We must reserve several hours of the neuropsychologist's time and the psychometrician's time for your evaluation appointment. As such, there is a No-Show fee of $100. If you are unable to attend any of your appointments, please contact our office as soon as possible to reschedule.      DRIVING: Regarding driving, in patients with progressive memory problems, driving will be impaired. We advise to have someone else do the driving if trouble finding directions or if minor accidents are reported. Independent driving assessment is available to determine safety of driving.   If you are interested in the driving assessment, you can contact the following:  [facility] [employer] [facility] in [place] [phone]  [facility] [phone]  [facility] [phone]  [facility] [phone] or [phone]   FALL PRECAUTIONS: Be cautious when walking.  Scan the area for obstacles that may increase the risk of trips and falls. When getting up in the mornings, sit up at the edge of the bed for a few minutes before getting out of bed. Consider elevating the bed at the head end to avoid drop of blood pressure when getting up. Walk always in a well-lit room (use night lights in the walls). Avoid area rugs or power cords from appliances in the middle of the walkways. Use a walker or a cane if necessary and consider physical therapy for balance exercise. Get your eyesight checked regularly.  FINANCIAL OVERSIGHT: Supervision, especially oversight when making financial decisions or transactions is also recommended.  HOME SAFETY: Consider the safety of the kitchen when operating appliances like stoves, microwave oven, and blender. Consider having supervision and share cooking responsibilities until no longer able to participate in those. Accidents with firearms and other hazards in the house should be identified and addressed as well.   ABILITY TO BE LEFT ALONE: If patient is unable to contact 911 operator, consider using LifeLine, or when the need is there, arrange for someone to stay with patients. Smoking is a fire hazard, consider supervision or cessation. Risk of wandering should be assessed by caregiver and if detected at any point, supervision and safe proof recommendations should be instituted.  MEDICATION SUPERVISION: Inability to self-administer medication needs to be constantly addressed. Implement a mechanism to ensure safe administration of the medications.      Mediterranean Diet A Mediterranean diet refers to food and lifestyle choices that are based on the traditions of countries located on the [employer] [place]. This way of eating has been shown to help prevent certain conditions and improve outcomes for people who have chronic diseases, like kidney disease and heart disease. What are tips for following this plan? Lifestyle  Cook and eat  meals together with your family, when possible. Drink enough fluid to keep your urine clear or pale yellow. Be physically active every day. This includes: Aerobic exercise like running or swimming. Leisure activities like gardening, walking, or housework. Get 7-8 hours of sleep each night. If recommended by your health care provider, drink red wine in moderation. This means 1 glass a day for nonpregnant women and 2 glasses a day for men. A glass of wine equals 5 oz (150 mL). Reading food labels  Check the serving size of packaged foods. For foods such as rice and pasta, the serving size refers to the amount of cooked product, not dry. Check the total fat in packaged foods. Avoid foods that have saturated fat or trans fats. Check the ingredients list for added sugars, such as corn syrup. Shopping  At the grocery store, buy most of your food from the areas near the walls of the store. This includes: Fresh fruits and vegetables (produce). Grains, beans, nuts, and seeds. Some of these may be available in unpackaged forms or large amounts (in bulk). Fresh seafood. Poultry and eggs. Low-fat dairy products. Buy whole ingredients instead of prepackaged foods. Buy fresh fruits and vegetables in-season from local farmers markets. Buy frozen fruits and vegetables in resealable bags. If you do not have access to quality fresh seafood, buy precooked frozen shrimp or canned fish, such as tuna, salmon, or sardines. Buy small amounts of raw or cooked vegetables, salads, or  olives from the deli or salad bar at your store. Stock your pantry so you always have certain foods on hand, such as olive oil, canned tuna, canned tomatoes, rice, pasta, and beans. Cooking  Cook foods with extra-virgin olive oil instead of using butter or other vegetable oils. Have meat as a side dish, and have vegetables or grains as your main dish. This means having meat in small portions or adding small amounts of meat to foods like  pasta or stew. Use beans or vegetables instead of meat in common dishes like chili or lasagna. Experiment with different cooking methods. Try roasting or broiling vegetables instead of steaming or sauteing them. Add frozen vegetables to soups, stews, pasta, or rice. Add nuts or seeds for added healthy fat at each meal. You can add these to yogurt, salads, or vegetable dishes. Marinate fish or vegetables using olive oil, lemon juice, garlic, and fresh herbs. Meal planning  Plan to eat 1 vegetarian meal one day each week. Try to work up to 2 vegetarian meals, if possible. Eat seafood 2 or more times a week. Have healthy snacks readily available, such as: Vegetable sticks with hummus. Greek yogurt. Fruit and nut trail mix. Eat balanced meals throughout the week. This includes: Fruit: 2-3 servings a day Vegetables: 4-5 servings a day Low-fat dairy: 2 servings a day Fish, poultry, or lean meat: 1 serving a day Beans and legumes: 2 or more servings a week Nuts and seeds: 1-2 servings a day Whole grains: 6-8 servings a day Extra-virgin olive oil: 3-4 servings a day Limit red meat and sweets to only a few servings a month What are my food choices? Mediterranean diet Recommended Grains: Whole-grain pasta. Brown rice. Bulgar wheat. Polenta. Couscous. Whole-wheat bread. [place] Madeira. Vegetables: Artichokes. Beets. Broccoli. Cabbage. Carrots. Eggplant. Green beans. Chard. Kale. Spinach. Onions. Leeks. Peas. Squash. Tomatoes. Peppers. Radishes. Fruits: Apples. Apricots. Avocado. Berries. Bananas. Cherries. Dates. Figs. Grapes. Lemons. Melon. Oranges. Peaches. Plums. Pomegranate. Meats and other protein foods: Beans. Almonds. Sunflower seeds. Pine nuts. Peanuts. Cod. Salmon. Scallops. Shrimp. Tuna. Tilapia. Clams. Oysters. Eggs. Dairy: Low-fat milk. Cheese. Greek yogurt. Beverages: Water. Red wine. Herbal tea. Fats and oils: Extra virgin olive oil. Avocado oil. Grape seed oil. Sweets and  desserts: [place] yogurt with honey. Baked apples. Poached pears. Trail mix. Seasoning and other foods: Basil. Cilantro. Coriander. Cumin. Mint. Parsley. Sage. Rosemary. Tarragon. Garlic. Oregano. Thyme. Pepper. Balsalmic vinegar. Tahini. Hummus. Tomato sauce. Olives. Mushrooms. Limit these Grains: Prepackaged pasta or rice dishes. Prepackaged cereal with added sugar. Vegetables: Deep fried potatoes (french fries). Fruits: Fruit canned in syrup. Meats and other protein foods: Beef. Pork. Lamb. Poultry with skin. Hot dogs. [place]. Dairy: Ice cream. Sour cream. Whole milk. Beverages: Juice. Sugar-sweetened soft drinks. Beer. Liquor and spirits. Fats and oils: Butter. Canola oil. Vegetable oil. Beef fat (tallow). Lard. Sweets and desserts: Cookies. Cakes. Pies. Candy. Seasoning and other foods: Mayonnaise. Premade sauces and marinades. The items listed may not be a complete list. Talk with your dietitian about what dietary choices are right for you. Summary The Mediterranean diet includes both food and lifestyle choices. Eat a variety of fresh fruits and vegetables, beans, nuts, seeds, and whole grains. Limit the amount of red meat and sweets that you eat. Talk with your health care provider about whether it is safe for you to drink red wine in moderation. This means 1 glass a day for nonpregnant women and 2 glasses a day for men. A glass of wine equals 5 oz (  150 mL). This information is not intended to replace advice given to you by your health care provider. Make sure you discuss any questions you have with your health care provider. Document Released: 01/05/2016 Document Revised: 02/07/2016 Document Reviewed: 01/05/2016 Elsevier Interactive Patient Education  2017 [employer].

## 2024-03-12 DIAGNOSIS — K2980 Duodenitis without bleeding: Secondary | ICD-10-CM | POA: Diagnosis not present

## 2024-03-12 DIAGNOSIS — N200 Calculus of kidney: Secondary | ICD-10-CM | POA: Diagnosis not present

## 2024-03-12 DIAGNOSIS — K5730 Diverticulosis of large intestine without perforation or abscess without bleeding: Secondary | ICD-10-CM | POA: Diagnosis not present

## 2024-03-12 DIAGNOSIS — I4719 Other supraventricular tachycardia: Secondary | ICD-10-CM | POA: Diagnosis not present

## 2024-03-12 DIAGNOSIS — N400 Enlarged prostate without lower urinary tract symptoms: Secondary | ICD-10-CM | POA: Diagnosis not present

## 2024-03-12 DIAGNOSIS — K529 Noninfective gastroenteritis and colitis, unspecified: Secondary | ICD-10-CM | POA: Diagnosis not present

## 2024-03-12 DIAGNOSIS — I428 Other cardiomyopathies: Secondary | ICD-10-CM | POA: Diagnosis not present

## 2024-03-12 DIAGNOSIS — Z89511 Acquired absence of right leg below knee: Secondary | ICD-10-CM | POA: Diagnosis not present

## 2024-03-12 DIAGNOSIS — I10 Essential (primary) hypertension: Secondary | ICD-10-CM | POA: Diagnosis not present

## 2024-03-12 DIAGNOSIS — R531 Weakness: Secondary | ICD-10-CM | POA: Diagnosis not present

## 2024-03-12 DIAGNOSIS — Z79899 Other long term (current) drug therapy: Secondary | ICD-10-CM | POA: Diagnosis not present

## 2024-03-12 DIAGNOSIS — Z9049 Acquired absence of other specified parts of digestive tract: Secondary | ICD-10-CM | POA: Diagnosis not present

## 2024-03-12 DIAGNOSIS — I2510 Atherosclerotic heart disease of native coronary artery without angina pectoris: Secondary | ICD-10-CM | POA: Diagnosis not present

## 2024-03-12 DIAGNOSIS — R197 Diarrhea, unspecified: Secondary | ICD-10-CM | POA: Diagnosis not present

## 2024-03-12 DIAGNOSIS — N281 Cyst of kidney, acquired: Secondary | ICD-10-CM | POA: Diagnosis not present

## 2024-03-12 DIAGNOSIS — T8789 Other complications of amputation stump: Secondary | ICD-10-CM | POA: Diagnosis not present

## 2024-03-12 DIAGNOSIS — I5022 Chronic systolic (congestive) heart failure: Secondary | ICD-10-CM | POA: Diagnosis not present

## 2024-03-12 DIAGNOSIS — E11622 Type 2 diabetes mellitus with other skin ulcer: Secondary | ICD-10-CM | POA: Diagnosis not present

## 2024-03-12 DIAGNOSIS — Z7901 Long term (current) use of anticoagulants: Secondary | ICD-10-CM | POA: Diagnosis not present

## 2024-03-12 DIAGNOSIS — I4891 Unspecified atrial fibrillation: Secondary | ICD-10-CM | POA: Diagnosis not present

## 2024-03-12 DIAGNOSIS — N401 Enlarged prostate with lower urinary tract symptoms: Secondary | ICD-10-CM | POA: Diagnosis not present

## 2024-03-12 DIAGNOSIS — R112 Nausea with vomiting, unspecified: Secondary | ICD-10-CM | POA: Diagnosis not present

## 2024-03-12 DIAGNOSIS — R609 Edema, unspecified: Secondary | ICD-10-CM | POA: Diagnosis not present

## 2024-03-17 DIAGNOSIS — I2510 Atherosclerotic heart disease of native coronary artery without angina pectoris: Secondary | ICD-10-CM | POA: Diagnosis not present

## 2024-03-17 DIAGNOSIS — Z7901 Long term (current) use of anticoagulants: Secondary | ICD-10-CM | POA: Diagnosis not present

## 2024-03-17 DIAGNOSIS — Z7984 Long term (current) use of oral hypoglycemic drugs: Secondary | ICD-10-CM | POA: Diagnosis not present

## 2024-03-17 DIAGNOSIS — I4891 Unspecified atrial fibrillation: Secondary | ICD-10-CM | POA: Diagnosis not present

## 2024-03-17 DIAGNOSIS — I10 Essential (primary) hypertension: Secondary | ICD-10-CM | POA: Diagnosis not present

## 2024-03-17 DIAGNOSIS — R110 Nausea: Secondary | ICD-10-CM | POA: Diagnosis not present

## 2024-03-17 DIAGNOSIS — K2980 Duodenitis without bleeding: Secondary | ICD-10-CM | POA: Diagnosis not present

## 2024-03-17 DIAGNOSIS — E119 Type 2 diabetes mellitus without complications: Secondary | ICD-10-CM | POA: Diagnosis not present

## 2024-03-31 ENCOUNTER — Telehealth: Payer: Self-pay

## 2024-03-31 NOTE — Telephone Encounter (Signed)
 Mri brain wo contrast was never done at [facility], multiplte attempts, FYI.

## 2024-04-03 ENCOUNTER — Other Ambulatory Visit: Payer: Self-pay | Admitting: Internal Medicine

## 2024-04-03 DIAGNOSIS — I4891 Unspecified atrial fibrillation: Secondary | ICD-10-CM

## 2024-04-03 NOTE — Telephone Encounter (Signed)
 Eliquis  5mg  refill request received. Patient is 83 years old, weight-90.3kg, Crea-0.65 on 03/17/24 via [facility] from [place], Diagnosis-Afib, and last seen by Dr. Mona on 05/13/23. Dose is appropriate based on dosing criteria. Will send in refill to requested pharmacy.

## 2024-04-19 ENCOUNTER — Other Ambulatory Visit: Payer: Self-pay | Admitting: Family Medicine

## 2024-05-16 ENCOUNTER — Other Ambulatory Visit: Payer: Self-pay | Admitting: Family Medicine

## 2024-05-18 ENCOUNTER — Other Ambulatory Visit: Payer: Self-pay | Admitting: Family Medicine

## 2024-05-18 MED ORDER — ESCITALOPRAM OXALATE 10 MG PO TABS: 10.0000 mg | ORAL_TABLET | Freq: Every day | ORAL | 0 refills | Status: DC

## 2024-05-18 MED ORDER — METFORMIN HCL 1000 MG PO TABS: ORAL_TABLET | ORAL | 0 refills | Status: DC

## 2024-05-18 MED ORDER — POTASSIUM CHLORIDE CRYS ER 20 MEQ PO TBCR: 40.0000 meq | EXTENDED_RELEASE_TABLET | Freq: Every morning | ORAL | 0 refills | Status: DC

## 2024-05-18 MED ORDER — PANTOPRAZOLE SODIUM 40 MG PO TBEC: 40.0000 mg | DELAYED_RELEASE_TABLET | Freq: Every day | ORAL | 0 refills | Status: DC

## 2024-05-18 NOTE — Telephone Encounter (Signed)
 Copied from CRM #[id number]. Topic: Clinical - Medication Refill >> May 18, 2024 11:29 AM Franky GRADE wrote: Medication:  metFORMIN  HCl 1000 MG TAKE 1 TABLET BY MOUTH EVERY DAY WITH BREAKFAST  Pantoprazole  Sodium 40 mg Oral Daily  Potassium Chloride  Crys ER 40 mEq Oral Every morning  Escitalopram  Oxalate 10 mg Oral Daily   Has the patient contacted their pharmacy? Yes, they asked patient to call the office.  (Agent: If no, request that the patient contact the pharmacy for the refill. If patient does not wish to contact the pharmacy document the reason why and proceed with request.) (Agent: If yes, when and what did the pharmacy advise?)  This is the patient's preferred pharmacy:  [id number] [place], [place] - [address] [place] [address] Phone: [phone] Fax: [phone]    Is this the correct pharmacy for this prescription? Yes If no, delete pharmacy and type the correct one.   Has the prescription been filled recently? No  Is the patient out of the medication? Yes  Has the patient been seen for an appointment in the last year OR does the patient have an upcoming appointment? Yes, on 06/03/2023 and just needs enough to hold him over until that date.   Can we respond through MyChart? Yes  Agent: Please be advised that Rx refills may take up to 3 business days. We ask that you follow-up with your pharmacy.

## 2024-05-19 ENCOUNTER — Encounter: Payer: Self-pay | Admitting: *Deleted

## 2024-05-20 ENCOUNTER — Other Ambulatory Visit: Payer: Self-pay | Admitting: Family Medicine

## 2024-05-20 ENCOUNTER — Other Ambulatory Visit: Payer: Self-pay | Admitting: Internal Medicine

## 2024-05-20 DIAGNOSIS — I5022 Chronic systolic (congestive) heart failure: Secondary | ICD-10-CM

## 2024-06-02 ENCOUNTER — Encounter: Payer: Self-pay | Admitting: Family Medicine

## 2024-06-02 ENCOUNTER — Ambulatory Visit (INDEPENDENT_AMBULATORY_CARE_PROVIDER_SITE_OTHER): Admitting: Family Medicine

## 2024-06-02 VITALS — BP 110/60 | HR 61 | Temp 97.7°F | Ht 71.0 in | Wt 192.0 lb

## 2024-06-02 DIAGNOSIS — Z89511 Acquired absence of right leg below knee: Secondary | ICD-10-CM

## 2024-06-02 DIAGNOSIS — F411 Generalized anxiety disorder: Secondary | ICD-10-CM

## 2024-06-02 DIAGNOSIS — Z7984 Long term (current) use of oral hypoglycemic drugs: Secondary | ICD-10-CM

## 2024-06-02 DIAGNOSIS — R351 Nocturia: Secondary | ICD-10-CM | POA: Diagnosis not present

## 2024-06-02 DIAGNOSIS — E785 Hyperlipidemia, unspecified: Secondary | ICD-10-CM | POA: Diagnosis not present

## 2024-06-02 DIAGNOSIS — N401 Enlarged prostate with lower urinary tract symptoms: Secondary | ICD-10-CM

## 2024-06-02 DIAGNOSIS — I152 Hypertension secondary to endocrine disorders: Secondary | ICD-10-CM

## 2024-06-02 DIAGNOSIS — E1159 Type 2 diabetes mellitus with other circulatory complications: Secondary | ICD-10-CM

## 2024-06-02 DIAGNOSIS — L309 Dermatitis, unspecified: Secondary | ICD-10-CM

## 2024-06-02 DIAGNOSIS — I480 Paroxysmal atrial fibrillation: Secondary | ICD-10-CM | POA: Diagnosis not present

## 2024-06-02 DIAGNOSIS — E1169 Type 2 diabetes mellitus with other specified complication: Secondary | ICD-10-CM

## 2024-06-02 DIAGNOSIS — G546 Phantom limb syndrome with pain: Secondary | ICD-10-CM | POA: Diagnosis not present

## 2024-06-02 DIAGNOSIS — E538 Deficiency of other specified B group vitamins: Secondary | ICD-10-CM | POA: Diagnosis not present

## 2024-06-02 DIAGNOSIS — I4891 Unspecified atrial fibrillation: Secondary | ICD-10-CM

## 2024-06-02 DIAGNOSIS — E1100 Type 2 diabetes mellitus with hyperosmolarity without nonketotic hyperglycemic-hyperosmolar coma (NKHHC): Secondary | ICD-10-CM

## 2024-06-02 DIAGNOSIS — E1165 Type 2 diabetes mellitus with hyperglycemia: Secondary | ICD-10-CM

## 2024-06-02 DIAGNOSIS — I5042 Chronic combined systolic (congestive) and diastolic (congestive) heart failure: Secondary | ICD-10-CM

## 2024-06-02 LAB — COMPREHENSIVE METABOLIC PANEL WITH GFR
ALT: 8 U/L (ref 3–53)
AST: 10 U/L (ref 5–37)
Albumin: 4 g/dL (ref 3.5–5.2)
Alkaline Phosphatase: 70 U/L (ref 39–117)
BUN: 22 mg/dL (ref 6–23)
CO2: 27 meq/L (ref 19–32)
Calcium: 9 mg/dL (ref 8.4–10.5)
Chloride: 105 meq/L (ref 96–112)
Creatinine, Ser: 0.96 mg/dL (ref 0.40–1.50)
GFR: 72.53 mL/min
Glucose, Bld: 101 mg/dL — ABNORMAL HIGH (ref 70–99)
Potassium: 4.2 meq/L (ref 3.5–5.1)
Sodium: 140 meq/L (ref 135–145)
Total Bilirubin: 0.5 mg/dL (ref 0.2–1.2)
Total Protein: 7 g/dL (ref 6.0–8.3)

## 2024-06-02 LAB — LIPID PANEL
Cholesterol: 147 mg/dL (ref 28–200)
HDL: 37.6 mg/dL — ABNORMAL LOW
LDL Cholesterol: 97 mg/dL (ref 10–99)
NonHDL: 109.1
Total CHOL/HDL Ratio: 4
Triglycerides: 59 mg/dL (ref 10.0–149.0)
VLDL: 11.8 mg/dL (ref 0.0–40.0)

## 2024-06-02 LAB — CBC
HCT: 36.8 % — ABNORMAL LOW (ref 39.0–52.0)
Hemoglobin: 12.2 g/dL — ABNORMAL LOW (ref 13.0–17.0)
MCHC: 33.3 g/dL (ref 30.0–36.0)
MCV: 88.4 fl (ref 78.0–100.0)
Platelets: 242 K/uL (ref 150.0–400.0)
RBC: 4.17 Mil/uL — ABNORMAL LOW (ref 4.22–5.81)
RDW: 14.1 % (ref 11.5–15.5)
WBC: 5.6 K/uL (ref 4.0–10.5)

## 2024-06-02 LAB — MICROALBUMIN / CREATININE URINE RATIO
Creatinine,U: 36.9 mg/dL
Microalb Creat Ratio: UNDETERMINED mg/g (ref 0.0–30.0)
Microalb, Ur: <0.7 mg/dL

## 2024-06-02 LAB — HEMOGLOBIN A1C: Hgb A1c MFr Bld: 5.4 % (ref 4.6–6.5)

## 2024-06-02 LAB — TSH: TSH: 1.74 u[IU]/mL (ref 0.35–5.50)

## 2024-06-02 MED ORDER — PANTOPRAZOLE SODIUM 40 MG PO TBEC: 40.0000 mg | DELAYED_RELEASE_TABLET | Freq: Every day | ORAL | 3 refills | Status: AC

## 2024-06-02 MED ORDER — POTASSIUM CHLORIDE CRYS ER 20 MEQ PO TBCR: 40.0000 meq | EXTENDED_RELEASE_TABLET | Freq: Every morning | ORAL | 3 refills | Status: AC

## 2024-06-02 MED ORDER — METFORMIN HCL 1000 MG PO TABS: 1000.0000 mg | ORAL_TABLET | Freq: Every day | ORAL | 3 refills | Status: AC

## 2024-06-02 MED ORDER — ESCITALOPRAM OXALATE 10 MG PO TABS: 10.0000 mg | ORAL_TABLET | Freq: Every day | ORAL | 3 refills | Status: AC

## 2024-06-02 MED ORDER — LEVOCETIRIZINE DIHYDROCHLORIDE 5 MG PO TABS: 5.0000 mg | ORAL_TABLET | Freq: Every evening | ORAL | 3 refills | Status: AC

## 2024-06-02 NOTE — Patient Instructions (Signed)
 It was very nice to see you today!  VISIT SUMMARY: You came in for a medication review and prescription refills. We reviewed your medications, confirmed you are taking them as prescribed, and made some adjustments. Your blood pressure and blood sugar levels are well-controlled, and you have received your flu shot for the year.  YOUR PLAN: MEDICATION MANAGEMENT AND RECONCILIATION: We reviewed your medications and confirmed you are taking them as prescribed. We made some adjustments to your regimen. -Discontinued gabapentin , Flomax , and baclofen  as you no longer need them. -Refilled Xyzal , Lexapro , metformin , and potassium.  CHRONIC COMBINED SYSTOLIC AND DIASTOLIC HEART FAILURE: Your blood pressure is well-controlled. -Continue taking Entresto  and Coreg  as prescribed by your cardiologist.  PAROXYSMAL ATRIAL FIBRILLATION: Your condition is stable. -Continue taking Eliquis  as prescribed by your cardiologist.  TYPE 2 DIABETES MELLITUS: Your blood sugar levels are well-controlled. -Continue taking metformin . -Blood work has been ordered to check your blood sugar levels.  DEPRESSION: Your depression is being managed. -Continue taking Lexapro .  ALLERGIC RHINITIS: Your allergy symptoms are managed. -Continue taking Xyzal .  GENERAL HEALTH MAINTENANCE: You have received your flu shot and routine monitoring is needed. -Blood work has been ordered to check your cholesterol, potassium, and blood counts. -Follow-up appointment scheduled in six months.  Return in about 6 months (around 11/30/2024) for Follow Up.   Take care, Dr Kennyth  PLEASE NOTE:  If you had any lab tests, please let us  know if you have not heard back within a few days. You may see your results on mychart before we have a chance to review them but we will give you a call once they are reviewed by us .   If we ordered any referrals today, please let us  know if you have not heard from their office within the next week.   If you  had any urgent prescriptions sent in today, please check with the pharmacy within an hour of our visit to make sure the prescription was transmitted appropriately.   Please try these tips to maintain a healthy lifestyle:  Eat at least 3 REAL meals and 1-2 snacks per day.  Aim for no more than 5 hours between eating.  If you eat breakfast, please do so within one hour of getting up.   Each meal should contain half fruits/vegetables, one quarter protein, and one quarter carbs (no bigger than a computer mouse)  Cut down on sweet beverages. This includes juice, soda, and sweet tea.   Drink at least 1 glass of water with each meal and aim for at least 8 glasses per day  Exercise at least 150 minutes every week.

## 2024-06-02 NOTE — Assessment & Plan Note (Signed)
 Check A1c with labs.  He is on metformin  1000 mg daily.

## 2024-06-02 NOTE — Assessment & Plan Note (Signed)
 Follows with cardiology.  Anticoagulated on Eliquis  and rate controlled with carvedilol 

## 2024-06-02 NOTE — Assessment & Plan Note (Signed)
 No longer on Flomax .  Symptoms are currently manageable on Vesicare  10 mg daily.  Will refill today.  Check labs.

## 2024-06-02 NOTE — Progress Notes (Signed)
" ° °  [name] is a 84 y.o. male who presents today for an office visit.  Assessment/Plan:  Chronic Problems Addressed Today: Type 2 diabetes mellitus with hyperglycemia (HCC) Check A1c with labs.  He is on metformin  1000 mg daily.  PAF (paroxysmal atrial fibrillation) (HCC) Follows with cardiology.  Anticoagulated on Eliquis  and rate controlled with carvedilol   Hypertension associated with diabetes (HCC) Blood pressure at goal today on regimen per cardiology with Entresto  24-26 twice daily and Coreg  25 mg twice daily  Phantom limb pain (HCC) No longer on gabapentin .  Pain is currently manageable.  B12 deficiency Check B12 with labs.  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  No longer on gabapentin  as above.  Anxiety state Symptoms are stable on Lexapro  10 mg daily.  Benign prostatic hyperplasia with nocturia No longer on Flomax .  Symptoms are currently manageable on Vesicare  10 mg daily.  Will refill today.  Check labs.     Subjective:  HPI:  See assessment / plan for status of chronic conditions.   Discussed the use of AI scribe software for clinical note transcription with the patient, who gave verbal consent to proceed.  History of Present Illness [name] is an 84 year old male who presents for medication review and prescription refills.  He is currently taking multiple medications, including Eliquis , Coreg , Lasix , Entresto , Xyzal , Lexapro , Metformin , and potassium supplements. He takes twelve pills every morning and additional medications in the evening.  He continues to use Eliquis  and Coreg , both prescribed by his cardiologist. He is also taking Lasix  and Entresto . He has discontinued gabapentin , Flomax , and baclofen  as he reports no significant pain, urinary issues, or need for muscle relaxants.  He takes Xyzal  for allergies and Lexapro . His blood sugar levels are stable, running around 97 to 100, and he continues to take  Metformin  for diabetes management. He has received his flu shot this year through a city program for employees and retirees.  No significant pain or urinary issues. Stable blood sugar levels.         Objective:  Physical Exam: BP 110/60   Pulse 61   Temp 97.7 F (36.5 C) (Temporal)   Ht 5' 11 (1.803 m)   Wt 192 lb (87.1 kg)   SpO2 97%   BMI 26.78 kg/m   Gen: No acute distress, resting comfortably CV: Regular rate and rhythm with no murmurs appreciated Pulm: Normal work of breathing, clear to auscultation bilaterally with no crackles, wheezes, or rhonchi Neuro: Grossly normal, moves all extremities Psych: Normal affect and thought content      Nysia Dell M. Kennyth, MD 06/02/2024 10:10 AM  "

## 2024-06-02 NOTE — Assessment & Plan Note (Signed)
 Blood pressure at goal today on regimen per cardiology with Entresto  24-26 twice daily and Coreg  25 mg twice daily

## 2024-06-02 NOTE — Assessment & Plan Note (Signed)
 Symptoms are stable on Lexapro  10 mg daily.

## 2024-06-02 NOTE — Assessment & Plan Note (Signed)
 Check B12 with labs.

## 2024-06-02 NOTE — Assessment & Plan Note (Signed)
 Stable.  No longer on gabapentin  as above.

## 2024-06-02 NOTE — Assessment & Plan Note (Signed)
 No longer on gabapentin .  Pain is currently manageable.

## 2024-06-04 ENCOUNTER — Encounter: Payer: Self-pay | Admitting: Family Medicine

## 2024-06-04 ENCOUNTER — Ambulatory Visit: Payer: Self-pay | Admitting: Family Medicine

## 2024-06-04 DIAGNOSIS — D649 Anemia, unspecified: Secondary | ICD-10-CM

## 2024-06-04 NOTE — Telephone Encounter (Signed)
 Please advise

## 2024-06-04 NOTE — Progress Notes (Signed)
 Hemoglobin is low though a little bit better than last year.  Can we add on an iron panel and B12?  All of his other labs are stable and we can recheck in a year.

## 2024-06-04 NOTE — Telephone Encounter (Signed)
 See result note.

## 2024-06-09 ENCOUNTER — Telehealth: Payer: Self-pay | Admitting: *Deleted

## 2024-06-09 NOTE — Telephone Encounter (Signed)
 Copied from CRM #[id number]. Topic: Clinical - Lab/Test Results >> Jun 05, 2024 11:14 AM Laymon HERO wrote: Reason for CRM: Patient daughter dana asking for Ida Elora HERO, RMA to return call to speak about lab results.   Spoke with Dona, patient daughter, lab results given  Lab appt schedule  Pamelyn Bancroft,RMA

## 2024-06-12 ENCOUNTER — Other Ambulatory Visit

## 2024-06-12 ENCOUNTER — Encounter: Payer: Self-pay | Admitting: Internal Medicine

## 2024-06-12 DIAGNOSIS — I5022 Chronic systolic (congestive) heart failure: Secondary | ICD-10-CM

## 2024-06-15 MED ORDER — SACUBITRIL-VALSARTAN 49-51 MG PO TABS: 1.0000 | ORAL_TABLET | Freq: Two times a day (BID) | ORAL | 0 refills | Status: AC

## 2024-06-16 ENCOUNTER — Other Ambulatory Visit: Payer: Self-pay | Admitting: Family Medicine

## 2024-07-08 ENCOUNTER — Ambulatory Visit

## 2024-08-21 ENCOUNTER — Ambulatory Visit: Admitting: Physician Assistant

## 2024-11-30 ENCOUNTER — Ambulatory Visit: Admitting: Family Medicine
